# Patient Record
Sex: Female | Born: 1935 | Race: Black or African American | Hispanic: No | Smoking: Never smoker
Health system: Southern US, Community
[De-identification: ages and names within clinical notes are randomized; demographics above are authoritative.]

## PROBLEM LIST (undated history)

## (undated) DIAGNOSIS — H409 Unspecified glaucoma: Secondary | ICD-10-CM

## (undated) DIAGNOSIS — E119 Type 2 diabetes mellitus without complications: Secondary | ICD-10-CM

## (undated) DIAGNOSIS — E785 Hyperlipidemia, unspecified: Secondary | ICD-10-CM

## (undated) DIAGNOSIS — I10 Essential (primary) hypertension: Secondary | ICD-10-CM

## (undated) DIAGNOSIS — M1990 Unspecified osteoarthritis, unspecified site: Secondary | ICD-10-CM

## (undated) DIAGNOSIS — R0602 Shortness of breath: Secondary | ICD-10-CM

## (undated) HISTORY — PX: JOINT REPLACEMENT: SHX530

## (undated) HISTORY — DX: Unspecified osteoarthritis, unspecified site: M19.90

## (undated) HISTORY — DX: Essential (primary) hypertension: I10

## (undated) HISTORY — DX: Unspecified glaucoma: H40.9

## (undated) HISTORY — PX: EYE SURGERY: SHX253

## (undated) HISTORY — DX: Type 2 diabetes mellitus without complications: E11.9

## (undated) HISTORY — PX: CHOLECYSTECTOMY: SHX55

## (undated) HISTORY — DX: Hyperlipidemia, unspecified: E78.5

## (undated) HISTORY — PX: HIP SURGERY: SHX245

## (undated) HISTORY — PX: TONSILLECTOMY: SUR1361

---

## 2004-09-16 ENCOUNTER — Ambulatory Visit: Payer: Self-pay | Admitting: Family Medicine

## 2004-09-30 ENCOUNTER — Other Ambulatory Visit: Payer: Self-pay

## 2004-09-30 ENCOUNTER — Observation Stay: Payer: Self-pay | Admitting: Orthopedic Surgery

## 2005-09-13 LAB — HM DEXA SCAN

## 2005-09-29 ENCOUNTER — Ambulatory Visit: Payer: Self-pay | Admitting: Family Medicine

## 2006-10-03 ENCOUNTER — Ambulatory Visit: Payer: Self-pay | Admitting: Family Medicine

## 2007-12-12 ENCOUNTER — Ambulatory Visit: Payer: Self-pay | Admitting: Family Medicine

## 2007-12-12 LAB — HM MAMMOGRAPHY

## 2008-02-14 ENCOUNTER — Ambulatory Visit: Payer: Self-pay | Admitting: Gastroenterology

## 2008-02-14 LAB — HM COLONOSCOPY

## 2008-11-16 ENCOUNTER — Emergency Department: Payer: Self-pay | Admitting: Internal Medicine

## 2011-08-18 DIAGNOSIS — H903 Sensorineural hearing loss, bilateral: Secondary | ICD-10-CM | POA: Diagnosis not present

## 2011-08-18 DIAGNOSIS — H6120 Impacted cerumen, unspecified ear: Secondary | ICD-10-CM | POA: Diagnosis not present

## 2011-12-08 DIAGNOSIS — E119 Type 2 diabetes mellitus without complications: Secondary | ICD-10-CM | POA: Diagnosis not present

## 2011-12-08 DIAGNOSIS — Z79899 Other long term (current) drug therapy: Secondary | ICD-10-CM | POA: Diagnosis not present

## 2011-12-08 DIAGNOSIS — N289 Disorder of kidney and ureter, unspecified: Secondary | ICD-10-CM | POA: Diagnosis not present

## 2011-12-08 DIAGNOSIS — D649 Anemia, unspecified: Secondary | ICD-10-CM | POA: Diagnosis not present

## 2011-12-08 DIAGNOSIS — I10 Essential (primary) hypertension: Secondary | ICD-10-CM | POA: Diagnosis not present

## 2011-12-21 DIAGNOSIS — H2510 Age-related nuclear cataract, unspecified eye: Secondary | ICD-10-CM | POA: Diagnosis not present

## 2012-01-06 DIAGNOSIS — H2510 Age-related nuclear cataract, unspecified eye: Secondary | ICD-10-CM | POA: Diagnosis not present

## 2012-01-18 DIAGNOSIS — H2510 Age-related nuclear cataract, unspecified eye: Secondary | ICD-10-CM | POA: Diagnosis not present

## 2012-02-20 DIAGNOSIS — H269 Unspecified cataract: Secondary | ICD-10-CM | POA: Diagnosis not present

## 2012-02-20 DIAGNOSIS — H2510 Age-related nuclear cataract, unspecified eye: Secondary | ICD-10-CM | POA: Diagnosis not present

## 2012-02-21 DIAGNOSIS — H2510 Age-related nuclear cataract, unspecified eye: Secondary | ICD-10-CM | POA: Diagnosis not present

## 2012-03-19 DIAGNOSIS — H269 Unspecified cataract: Secondary | ICD-10-CM | POA: Diagnosis not present

## 2012-03-19 DIAGNOSIS — H2510 Age-related nuclear cataract, unspecified eye: Secondary | ICD-10-CM | POA: Diagnosis not present

## 2012-06-18 DIAGNOSIS — H409 Unspecified glaucoma: Secondary | ICD-10-CM | POA: Diagnosis not present

## 2012-06-18 DIAGNOSIS — H4011X0 Primary open-angle glaucoma, stage unspecified: Secondary | ICD-10-CM | POA: Diagnosis not present

## 2012-07-09 DIAGNOSIS — M76899 Other specified enthesopathies of unspecified lower limb, excluding foot: Secondary | ICD-10-CM | POA: Diagnosis not present

## 2012-07-09 DIAGNOSIS — Z23 Encounter for immunization: Secondary | ICD-10-CM | POA: Diagnosis not present

## 2012-07-09 DIAGNOSIS — E7800 Pure hypercholesterolemia, unspecified: Secondary | ICD-10-CM | POA: Diagnosis not present

## 2012-07-09 DIAGNOSIS — I10 Essential (primary) hypertension: Secondary | ICD-10-CM | POA: Diagnosis not present

## 2012-07-09 DIAGNOSIS — E119 Type 2 diabetes mellitus without complications: Secondary | ICD-10-CM | POA: Diagnosis not present

## 2012-07-09 DIAGNOSIS — M1990 Unspecified osteoarthritis, unspecified site: Secondary | ICD-10-CM | POA: Diagnosis not present

## 2012-07-10 DIAGNOSIS — E119 Type 2 diabetes mellitus without complications: Secondary | ICD-10-CM | POA: Diagnosis not present

## 2012-07-10 DIAGNOSIS — D638 Anemia in other chronic diseases classified elsewhere: Secondary | ICD-10-CM | POA: Diagnosis not present

## 2012-07-10 DIAGNOSIS — I10 Essential (primary) hypertension: Secondary | ICD-10-CM | POA: Diagnosis not present

## 2012-07-10 DIAGNOSIS — R5381 Other malaise: Secondary | ICD-10-CM | POA: Diagnosis not present

## 2012-07-10 DIAGNOSIS — R5383 Other fatigue: Secondary | ICD-10-CM | POA: Diagnosis not present

## 2012-07-10 DIAGNOSIS — R079 Chest pain, unspecified: Secondary | ICD-10-CM | POA: Diagnosis not present

## 2012-07-10 DIAGNOSIS — E785 Hyperlipidemia, unspecified: Secondary | ICD-10-CM | POA: Diagnosis not present

## 2012-07-10 DIAGNOSIS — E7800 Pure hypercholesterolemia, unspecified: Secondary | ICD-10-CM | POA: Diagnosis not present

## 2012-07-11 DIAGNOSIS — E119 Type 2 diabetes mellitus without complications: Secondary | ICD-10-CM | POA: Diagnosis not present

## 2012-07-11 DIAGNOSIS — I10 Essential (primary) hypertension: Secondary | ICD-10-CM | POA: Diagnosis not present

## 2012-07-11 DIAGNOSIS — R0789 Other chest pain: Secondary | ICD-10-CM | POA: Diagnosis not present

## 2012-07-11 DIAGNOSIS — R109 Unspecified abdominal pain: Secondary | ICD-10-CM | POA: Diagnosis not present

## 2012-07-11 DIAGNOSIS — E785 Hyperlipidemia, unspecified: Secondary | ICD-10-CM | POA: Diagnosis not present

## 2012-07-11 DIAGNOSIS — I119 Hypertensive heart disease without heart failure: Secondary | ICD-10-CM | POA: Diagnosis not present

## 2012-07-11 DIAGNOSIS — R7989 Other specified abnormal findings of blood chemistry: Secondary | ICD-10-CM | POA: Diagnosis not present

## 2012-07-11 DIAGNOSIS — I059 Rheumatic mitral valve disease, unspecified: Secondary | ICD-10-CM | POA: Diagnosis not present

## 2012-07-11 DIAGNOSIS — R748 Abnormal levels of other serum enzymes: Secondary | ICD-10-CM | POA: Diagnosis not present

## 2012-07-13 ENCOUNTER — Ambulatory Visit: Payer: Self-pay | Admitting: Family Medicine

## 2012-07-13 DIAGNOSIS — R141 Gas pain: Secondary | ICD-10-CM | POA: Diagnosis not present

## 2012-07-13 DIAGNOSIS — K8020 Calculus of gallbladder without cholecystitis without obstruction: Secondary | ICD-10-CM | POA: Diagnosis not present

## 2012-07-13 DIAGNOSIS — R7989 Other specified abnormal findings of blood chemistry: Secondary | ICD-10-CM | POA: Diagnosis not present

## 2012-07-16 DIAGNOSIS — R109 Unspecified abdominal pain: Secondary | ICD-10-CM | POA: Diagnosis not present

## 2012-07-16 DIAGNOSIS — K831 Obstruction of bile duct: Secondary | ICD-10-CM | POA: Diagnosis not present

## 2012-07-20 DIAGNOSIS — K8070 Calculus of gallbladder and bile duct without cholecystitis without obstruction: Secondary | ICD-10-CM | POA: Diagnosis not present

## 2012-07-20 DIAGNOSIS — K838 Other specified diseases of biliary tract: Secondary | ICD-10-CM | POA: Diagnosis not present

## 2012-07-20 DIAGNOSIS — R17 Unspecified jaundice: Secondary | ICD-10-CM | POA: Diagnosis not present

## 2012-07-20 DIAGNOSIS — K8020 Calculus of gallbladder without cholecystitis without obstruction: Secondary | ICD-10-CM | POA: Diagnosis not present

## 2012-07-20 DIAGNOSIS — R935 Abnormal findings on diagnostic imaging of other abdominal regions, including retroperitoneum: Secondary | ICD-10-CM | POA: Diagnosis not present

## 2012-07-23 DIAGNOSIS — K8020 Calculus of gallbladder without cholecystitis without obstruction: Secondary | ICD-10-CM | POA: Diagnosis not present

## 2012-07-23 DIAGNOSIS — K8070 Calculus of gallbladder and bile duct without cholecystitis without obstruction: Secondary | ICD-10-CM | POA: Diagnosis not present

## 2012-08-06 DIAGNOSIS — R110 Nausea: Secondary | ICD-10-CM | POA: Diagnosis not present

## 2012-08-06 DIAGNOSIS — M129 Arthropathy, unspecified: Secondary | ICD-10-CM | POA: Diagnosis not present

## 2012-08-06 DIAGNOSIS — D649 Anemia, unspecified: Secondary | ICD-10-CM | POA: Diagnosis not present

## 2012-08-06 DIAGNOSIS — N189 Chronic kidney disease, unspecified: Secondary | ICD-10-CM | POA: Diagnosis not present

## 2012-08-06 DIAGNOSIS — R7989 Other specified abnormal findings of blood chemistry: Secondary | ICD-10-CM | POA: Diagnosis not present

## 2012-08-06 DIAGNOSIS — K838 Other specified diseases of biliary tract: Secondary | ICD-10-CM | POA: Diagnosis not present

## 2012-08-06 DIAGNOSIS — Z79899 Other long term (current) drug therapy: Secondary | ICD-10-CM | POA: Diagnosis not present

## 2012-08-06 DIAGNOSIS — Z96649 Presence of unspecified artificial hip joint: Secondary | ICD-10-CM | POA: Diagnosis not present

## 2012-08-06 DIAGNOSIS — E119 Type 2 diabetes mellitus without complications: Secondary | ICD-10-CM | POA: Diagnosis not present

## 2012-08-06 DIAGNOSIS — K8010 Calculus of gallbladder with chronic cholecystitis without obstruction: Secondary | ICD-10-CM | POA: Diagnosis not present

## 2012-08-06 DIAGNOSIS — K8000 Calculus of gallbladder with acute cholecystitis without obstruction: Secondary | ICD-10-CM | POA: Diagnosis not present

## 2012-08-06 DIAGNOSIS — I129 Hypertensive chronic kidney disease with stage 1 through stage 4 chronic kidney disease, or unspecified chronic kidney disease: Secondary | ICD-10-CM | POA: Diagnosis not present

## 2012-08-09 DIAGNOSIS — I129 Hypertensive chronic kidney disease with stage 1 through stage 4 chronic kidney disease, or unspecified chronic kidney disease: Secondary | ICD-10-CM | POA: Diagnosis not present

## 2012-08-09 DIAGNOSIS — R110 Nausea: Secondary | ICD-10-CM | POA: Diagnosis not present

## 2012-08-09 DIAGNOSIS — K8050 Calculus of bile duct without cholangitis or cholecystitis without obstruction: Secondary | ICD-10-CM | POA: Diagnosis not present

## 2012-08-09 DIAGNOSIS — K8010 Calculus of gallbladder with chronic cholecystitis without obstruction: Secondary | ICD-10-CM | POA: Diagnosis not present

## 2012-08-09 DIAGNOSIS — K838 Other specified diseases of biliary tract: Secondary | ICD-10-CM | POA: Diagnosis not present

## 2012-08-09 DIAGNOSIS — R7989 Other specified abnormal findings of blood chemistry: Secondary | ICD-10-CM | POA: Diagnosis not present

## 2012-08-09 DIAGNOSIS — I10 Essential (primary) hypertension: Secondary | ICD-10-CM | POA: Diagnosis not present

## 2012-08-09 DIAGNOSIS — N189 Chronic kidney disease, unspecified: Secondary | ICD-10-CM | POA: Diagnosis not present

## 2012-08-09 DIAGNOSIS — K8020 Calculus of gallbladder without cholecystitis without obstruction: Secondary | ICD-10-CM | POA: Diagnosis not present

## 2012-08-09 DIAGNOSIS — E119 Type 2 diabetes mellitus without complications: Secondary | ICD-10-CM | POA: Diagnosis not present

## 2012-08-09 DIAGNOSIS — K8000 Calculus of gallbladder with acute cholecystitis without obstruction: Secondary | ICD-10-CM | POA: Diagnosis not present

## 2012-08-10 DIAGNOSIS — R7989 Other specified abnormal findings of blood chemistry: Secondary | ICD-10-CM | POA: Diagnosis not present

## 2012-08-10 DIAGNOSIS — R110 Nausea: Secondary | ICD-10-CM | POA: Diagnosis not present

## 2012-08-10 DIAGNOSIS — K838 Other specified diseases of biliary tract: Secondary | ICD-10-CM | POA: Diagnosis not present

## 2012-08-10 DIAGNOSIS — K8000 Calculus of gallbladder with acute cholecystitis without obstruction: Secondary | ICD-10-CM | POA: Diagnosis not present

## 2012-08-10 DIAGNOSIS — K8010 Calculus of gallbladder with chronic cholecystitis without obstruction: Secondary | ICD-10-CM | POA: Diagnosis not present

## 2012-08-10 DIAGNOSIS — E119 Type 2 diabetes mellitus without complications: Secondary | ICD-10-CM | POA: Diagnosis not present

## 2012-08-27 DIAGNOSIS — K8020 Calculus of gallbladder without cholecystitis without obstruction: Secondary | ICD-10-CM | POA: Diagnosis not present

## 2012-10-25 DIAGNOSIS — H409 Unspecified glaucoma: Secondary | ICD-10-CM | POA: Diagnosis not present

## 2012-10-25 DIAGNOSIS — H4011X0 Primary open-angle glaucoma, stage unspecified: Secondary | ICD-10-CM | POA: Diagnosis not present

## 2013-03-05 DIAGNOSIS — R5381 Other malaise: Secondary | ICD-10-CM | POA: Diagnosis not present

## 2013-03-05 DIAGNOSIS — E119 Type 2 diabetes mellitus without complications: Secondary | ICD-10-CM | POA: Diagnosis not present

## 2013-03-05 DIAGNOSIS — Z23 Encounter for immunization: Secondary | ICD-10-CM | POA: Diagnosis not present

## 2013-03-05 DIAGNOSIS — R9431 Abnormal electrocardiogram [ECG] [EKG]: Secondary | ICD-10-CM | POA: Diagnosis not present

## 2013-03-05 DIAGNOSIS — R109 Unspecified abdominal pain: Secondary | ICD-10-CM | POA: Diagnosis not present

## 2013-03-12 DIAGNOSIS — I209 Angina pectoris, unspecified: Secondary | ICD-10-CM | POA: Diagnosis not present

## 2013-03-12 DIAGNOSIS — R0602 Shortness of breath: Secondary | ICD-10-CM | POA: Diagnosis not present

## 2013-03-12 DIAGNOSIS — R9431 Abnormal electrocardiogram [ECG] [EKG]: Secondary | ICD-10-CM | POA: Diagnosis not present

## 2013-03-12 DIAGNOSIS — E782 Mixed hyperlipidemia: Secondary | ICD-10-CM | POA: Diagnosis not present

## 2013-03-14 DIAGNOSIS — H4011X0 Primary open-angle glaucoma, stage unspecified: Secondary | ICD-10-CM | POA: Diagnosis not present

## 2013-03-14 DIAGNOSIS — H409 Unspecified glaucoma: Secondary | ICD-10-CM | POA: Diagnosis not present

## 2013-04-10 DIAGNOSIS — D649 Anemia, unspecified: Secondary | ICD-10-CM | POA: Diagnosis not present

## 2013-04-10 DIAGNOSIS — I2510 Atherosclerotic heart disease of native coronary artery without angina pectoris: Secondary | ICD-10-CM | POA: Diagnosis not present

## 2013-04-10 DIAGNOSIS — R5381 Other malaise: Secondary | ICD-10-CM | POA: Diagnosis not present

## 2013-04-10 DIAGNOSIS — E119 Type 2 diabetes mellitus without complications: Secondary | ICD-10-CM | POA: Diagnosis not present

## 2013-04-10 DIAGNOSIS — Z23 Encounter for immunization: Secondary | ICD-10-CM | POA: Diagnosis not present

## 2013-04-16 DIAGNOSIS — E119 Type 2 diabetes mellitus without complications: Secondary | ICD-10-CM | POA: Diagnosis not present

## 2013-04-16 DIAGNOSIS — E782 Mixed hyperlipidemia: Secondary | ICD-10-CM | POA: Diagnosis not present

## 2013-04-16 DIAGNOSIS — R9430 Abnormal result of cardiovascular function study, unspecified: Secondary | ICD-10-CM | POA: Diagnosis not present

## 2013-04-16 DIAGNOSIS — I209 Angina pectoris, unspecified: Secondary | ICD-10-CM | POA: Diagnosis not present

## 2013-04-22 ENCOUNTER — Ambulatory Visit: Payer: Self-pay | Admitting: Internal Medicine

## 2013-04-22 DIAGNOSIS — I209 Angina pectoris, unspecified: Secondary | ICD-10-CM | POA: Diagnosis not present

## 2013-04-22 DIAGNOSIS — I10 Essential (primary) hypertension: Secondary | ICD-10-CM | POA: Diagnosis not present

## 2013-04-22 DIAGNOSIS — E119 Type 2 diabetes mellitus without complications: Secondary | ICD-10-CM | POA: Diagnosis not present

## 2013-04-22 DIAGNOSIS — R9430 Abnormal result of cardiovascular function study, unspecified: Secondary | ICD-10-CM | POA: Diagnosis not present

## 2013-04-22 DIAGNOSIS — I2510 Atherosclerotic heart disease of native coronary artery without angina pectoris: Secondary | ICD-10-CM | POA: Diagnosis not present

## 2013-04-22 DIAGNOSIS — E785 Hyperlipidemia, unspecified: Secondary | ICD-10-CM | POA: Diagnosis not present

## 2013-04-22 HISTORY — PX: CARDIAC CATHETERIZATION: SHX172

## 2013-04-24 ENCOUNTER — Encounter: Payer: Self-pay | Admitting: Cardiothoracic Surgery

## 2013-04-25 ENCOUNTER — Other Ambulatory Visit: Payer: Self-pay | Admitting: *Deleted

## 2013-04-25 DIAGNOSIS — E1129 Type 2 diabetes mellitus with other diabetic kidney complication: Secondary | ICD-10-CM | POA: Insufficient documentation

## 2013-04-25 DIAGNOSIS — I10 Essential (primary) hypertension: Secondary | ICD-10-CM | POA: Insufficient documentation

## 2013-04-25 DIAGNOSIS — E785 Hyperlipidemia, unspecified: Secondary | ICD-10-CM | POA: Insufficient documentation

## 2013-04-25 DIAGNOSIS — E119 Type 2 diabetes mellitus without complications: Secondary | ICD-10-CM | POA: Insufficient documentation

## 2013-04-26 ENCOUNTER — Encounter: Payer: Self-pay | Admitting: Cardiothoracic Surgery

## 2013-04-26 ENCOUNTER — Institutional Professional Consult (permissible substitution) (INDEPENDENT_AMBULATORY_CARE_PROVIDER_SITE_OTHER): Payer: Medicare Other | Admitting: Cardiothoracic Surgery

## 2013-04-26 ENCOUNTER — Other Ambulatory Visit: Payer: Self-pay | Admitting: *Deleted

## 2013-04-26 VITALS — BP 94/49 | HR 79 | Resp 20 | Ht 64.0 in | Wt 149.0 lb

## 2013-04-26 DIAGNOSIS — I340 Nonrheumatic mitral (valve) insufficiency: Secondary | ICD-10-CM

## 2013-04-26 DIAGNOSIS — M1610 Unilateral primary osteoarthritis, unspecified hip: Secondary | ICD-10-CM | POA: Diagnosis not present

## 2013-04-26 DIAGNOSIS — I059 Rheumatic mitral valve disease, unspecified: Secondary | ICD-10-CM | POA: Diagnosis not present

## 2013-04-26 DIAGNOSIS — I2510 Atherosclerotic heart disease of native coronary artery without angina pectoris: Secondary | ICD-10-CM

## 2013-04-26 NOTE — Progress Notes (Signed)
PCP is Gabriela Post, MD Referring Provider is Corey Skains, MD  Chief Complaint  Patient presents with  . Coronary Artery Disease    Surgical eval on CAD, Lt main, Cardiac cath 04/22/13, ECHO 03/28/13    HPI: 79 year old Afro-American female diabetic nonsmoker living independently with recent onset dyspnea on exertion and decreasing exercise tolerance. No discrete chest pain, no orthopnea or PND. Cardiology consultation>>> myocardial perfusion study which was abnormal and 2-D echocardiogram which was abnormal for EF 30%, moderate to severe mitral regurgitation. Patient underwent cardiac catheterization earlier this week demonstrating left main and severe three-vessel CADin a diabetic pattern. A cardiothoracic surgical evaluation was requested. Patient has been stable since cardiac catheterization via right femoral artery. Dressing of site removed today without hematoma.  No prior history of MI. No strong family history of CAD or MI. Last hospitalization was for laparoscopic cholecystectomy. Patient has had remote right total hip replacement. Never had problems with anesthesia or bleeding.   Past Medical History  Diagnosis Date  . Hypertension   . Diabetes mellitus   . Hyperlipidemia   . Arthritis   . Glaucoma     Past Surgical History  Procedure Laterality Date  . Hip surgery      right    Family History  Problem Relation Age of Onset  . Diabetes Sister     Social History History  Substance Use Topics  . Smoking status: Never Smoker   . Smokeless tobacco: Never Used  . Alcohol Use: No    Current Outpatient Prescriptions  Medication Sig Dispense Refill  . aspirin 81 MG tablet Take 81 mg by mouth daily.      . carvedilol (COREG) 3.125 MG tablet Take 3.125 mg by mouth 2 (two) times daily with a meal.      . hydrochlorothiazide (HYDRODIURIL) 25 MG tablet Take 25 mg by mouth daily.      Marland Kitchen lisinopril (PRINIVIL,ZESTRIL) 5 MG tablet Take 5 mg by mouth daily.      .  pioglitazone (ACTOS) 45 MG tablet Take 45 mg by mouth daily.       No current facility-administered medications for this visit.    No Known Allergies  Review of Systems General no weight loss fever or recent respiratory illness HEENT-dental plates seen a dentist within the past 12 months Thoracic nonsmoker no hemoptysis Cardiac no history of murmur or arrhythmia or MI Neurologic no history of stroke or dementia or seizure Endocrine positive diabetes BUN 30 creatinine 1.4 Vascular negative for DVT claudication TIA Musculoskeletal-right hip replacement walks with a limp Neurologic right-hand dominant  BP 94/49  Pulse 79  Resp 20  Ht 5\' 4"  (1.626 m)  Wt 149 lb (67.586 kg)  BMI 25.56 kg/m2  SpO2 97%   Physical Exam General appearance elderly after married female coming by her daughter no acute distress HEENT normocephalic pupils equal dentition adequate Neck without JVD mass or carotid bruit Lymphatics without palpable nodes in the neck Thorax no deformity or tenderness, breath sounds clear Cardiac regular rhythm, faint 2/6 holosystolic murmur Abdomen soft nontender without pulsatile mass Extremities no clubbing cyanosis edema or tenderness Vascular nonpalpable pedal pulses bilaterally, no obvious venous insufficiency of the lower extremities Neurologic nonfocal walks with a limp  Diagnostic Tests: Coronary  Arteriogram  reviewed demonstrating moderate left main stenosis high-grade LAD stenosis atretic chronically occluded circumflex and nondominant RCA with 80% stenosis. Echo report demonstrates moderate to severe MR    Impression: CAD ischemic MR symptomatic. Patient is interested in  surgery to improve quality of life and improved survival. She would be at increased risk due to rib as age and reduced LV function. She understands that she may very well need skilled nursing facility placement after discharge from hospital until she can return home to live  independently.  She wishes to hold off on surgery until after her birthday on [birth year]. We will schedule her for surgery October 16 with preop assessment October 14.      Plan:CABG mitral valve repair October 16 at [facility] hospital

## 2013-05-01 ENCOUNTER — Other Ambulatory Visit: Payer: Self-pay | Admitting: *Deleted

## 2013-05-01 DIAGNOSIS — I2510 Atherosclerotic heart disease of native coronary artery without angina pectoris: Secondary | ICD-10-CM

## 2013-05-01 DIAGNOSIS — I059 Rheumatic mitral valve disease, unspecified: Secondary | ICD-10-CM

## 2013-05-24 ENCOUNTER — Encounter (HOSPITAL_COMMUNITY): Payer: Self-pay | Admitting: Pharmacist

## 2013-05-28 ENCOUNTER — Ambulatory Visit (HOSPITAL_COMMUNITY)
Admission: RE | Admit: 2013-05-28 | Discharge: 2013-05-28 | Disposition: A | Discharge status: Home / Self Care | Payer: Medicare Other | Source: Ambulatory Visit | Attending: Cardiothoracic Surgery | Admitting: Cardiothoracic Surgery

## 2013-05-28 ENCOUNTER — Encounter (HOSPITAL_COMMUNITY)
Admission: RE | Admit: 2013-05-28 | Discharge: 2013-05-28 | Disposition: A | Discharge status: Home / Self Care | Payer: Medicare Other | Source: Ambulatory Visit | Attending: Cardiothoracic Surgery | Admitting: Cardiothoracic Surgery

## 2013-05-28 ENCOUNTER — Encounter (HOSPITAL_COMMUNITY): Payer: Self-pay

## 2013-05-28 ENCOUNTER — Inpatient Hospital Stay (HOSPITAL_COMMUNITY)
Admission: RE | Admit: 2013-05-28 | Discharge: 2013-05-28 | Disposition: A | Discharge status: Home / Self Care | Payer: Medicare Other | Source: Ambulatory Visit | Attending: Cardiothoracic Surgery | Admitting: Cardiothoracic Surgery

## 2013-05-28 VITALS — BP 145/68 | HR 71 | Temp 98.3°F | Resp 18 | Ht 63.0 in | Wt 147.9 lb

## 2013-05-28 DIAGNOSIS — I059 Rheumatic mitral valve disease, unspecified: Secondary | ICD-10-CM

## 2013-05-28 DIAGNOSIS — Z01812 Encounter for preprocedural laboratory examination: Secondary | ICD-10-CM | POA: Insufficient documentation

## 2013-05-28 DIAGNOSIS — E8779 Other fluid overload: Secondary | ICD-10-CM | POA: Diagnosis not present

## 2013-05-28 DIAGNOSIS — Z01818 Encounter for other preprocedural examination: Secondary | ICD-10-CM | POA: Insufficient documentation

## 2013-05-28 DIAGNOSIS — I10 Essential (primary) hypertension: Secondary | ICD-10-CM | POA: Diagnosis not present

## 2013-05-28 DIAGNOSIS — Z452 Encounter for adjustment and management of vascular access device: Secondary | ICD-10-CM | POA: Diagnosis not present

## 2013-05-28 DIAGNOSIS — J9819 Other pulmonary collapse: Secondary | ICD-10-CM | POA: Diagnosis not present

## 2013-05-28 DIAGNOSIS — J811 Chronic pulmonary edema: Secondary | ICD-10-CM | POA: Diagnosis not present

## 2013-05-28 DIAGNOSIS — I2510 Atherosclerotic heart disease of native coronary artery without angina pectoris: Secondary | ICD-10-CM

## 2013-05-28 DIAGNOSIS — Z01811 Encounter for preprocedural respiratory examination: Secondary | ICD-10-CM | POA: Insufficient documentation

## 2013-05-28 DIAGNOSIS — M129 Arthropathy, unspecified: Secondary | ICD-10-CM | POA: Diagnosis present

## 2013-05-28 DIAGNOSIS — R918 Other nonspecific abnormal finding of lung field: Secondary | ICD-10-CM | POA: Diagnosis not present

## 2013-05-28 DIAGNOSIS — Z01810 Encounter for preprocedural cardiovascular examination: Secondary | ICD-10-CM | POA: Insufficient documentation

## 2013-05-28 DIAGNOSIS — I959 Hypotension, unspecified: Secondary | ICD-10-CM | POA: Diagnosis present

## 2013-05-28 DIAGNOSIS — J90 Pleural effusion, not elsewhere classified: Secondary | ICD-10-CM | POA: Diagnosis not present

## 2013-05-28 DIAGNOSIS — D62 Acute posthemorrhagic anemia: Secondary | ICD-10-CM | POA: Diagnosis not present

## 2013-05-28 DIAGNOSIS — E785 Hyperlipidemia, unspecified: Secondary | ICD-10-CM | POA: Insufficient documentation

## 2013-05-28 DIAGNOSIS — E119 Type 2 diabetes mellitus without complications: Secondary | ICD-10-CM | POA: Diagnosis not present

## 2013-05-28 DIAGNOSIS — I4891 Unspecified atrial fibrillation: Secondary | ICD-10-CM | POA: Diagnosis not present

## 2013-05-28 HISTORY — DX: Shortness of breath: R06.02

## 2013-05-28 LAB — BLOOD GAS, ARTERIAL
Acid-base deficit: 2.7 mmol/L — ABNORMAL HIGH (ref 0.0–2.0)
Bicarbonate: 21.1 mEq/L (ref 20.0–24.0)
Drawn by: 24485
O2 Saturation: 96 %
Patient temperature: 98.6
TCO2: 22.1 mmol/L (ref 0–100)
pCO2 arterial: 33.5 mmHg — ABNORMAL LOW (ref 35.0–45.0)
pH, Arterial: 7.416 (ref 7.350–7.450)
pO2, Arterial: 81.6 mmHg (ref 80.0–100.0)

## 2013-05-28 LAB — COMPREHENSIVE METABOLIC PANEL
ALT: 10 U/L (ref 0–35)
AST: 20 U/L (ref 0–37)
Albumin: 3.8 g/dL (ref 3.5–5.2)
Alkaline Phosphatase: 109 U/L (ref 39–117)
BUN: 31 mg/dL — ABNORMAL HIGH (ref 6–23)
CO2: 23 mEq/L (ref 19–32)
Calcium: 9.5 mg/dL (ref 8.4–10.5)
Chloride: 105 mEq/L (ref 96–112)
Creatinine, Ser: 1.21 mg/dL — ABNORMAL HIGH (ref 0.50–1.10)
GFR calc Af Amer: 46 mL/min — ABNORMAL LOW (ref >90)
GFR calc non Af Amer: 40 mL/min — ABNORMAL LOW (ref >90)
Glucose, Bld: 109 mg/dL — ABNORMAL HIGH (ref 70–99)
Potassium: 4.6 mEq/L (ref 3.5–5.1)
Sodium: 140 mEq/L (ref 135–145)
Total Bilirubin: 0.3 mg/dL (ref 0.3–1.2)
Total Protein: 8.6 g/dL — ABNORMAL HIGH (ref 6.0–8.3)

## 2013-05-28 LAB — CBC
HCT: 32.7 % — ABNORMAL LOW (ref 36.0–46.0)
Hemoglobin: 10.5 g/dL — ABNORMAL LOW (ref 12.0–15.0)
MCH: 27.9 pg (ref 26.0–34.0)
MCHC: 32.1 g/dL (ref 30.0–36.0)
MCV: 87 fL (ref 78.0–100.0)
Platelets: 215 10*3/uL (ref 150–400)
RBC: 3.76 MIL/uL — ABNORMAL LOW (ref 3.87–5.11)
RDW: 14.6 % (ref 11.5–15.5)
WBC: 7.3 10*3/uL (ref 4.0–10.5)

## 2013-05-28 LAB — URINALYSIS, ROUTINE W REFLEX MICROSCOPIC
Bilirubin Urine: NEGATIVE
Glucose, UA: NEGATIVE mg/dL
Hgb urine dipstick: NEGATIVE
Ketones, ur: NEGATIVE mg/dL
Nitrite: NEGATIVE
Protein, ur: NEGATIVE mg/dL
Specific Gravity, Urine: 1.012 (ref 1.005–1.030)
Urobilinogen, UA: 0.2 mg/dL (ref 0.0–1.0)
pH: 7 (ref 5.0–8.0)

## 2013-05-28 LAB — PULMONARY FUNCTION TEST

## 2013-05-28 LAB — ABO/RH: ABO/RH(D): O POS

## 2013-05-28 LAB — HEMOGLOBIN A1C
Hgb A1c MFr Bld: 7.5 % — ABNORMAL HIGH (ref <5.7)
Mean Plasma Glucose: 169 mg/dL — ABNORMAL HIGH (ref <117)

## 2013-05-28 LAB — SURGICAL PCR SCREEN
MRSA, PCR: NEGATIVE
Staphylococcus aureus: NEGATIVE

## 2013-05-28 LAB — PROTIME-INR
INR: 1.01 (ref 0.00–1.49)
Prothrombin Time: 13.1 seconds (ref 11.6–15.2)

## 2013-05-28 LAB — URINE MICROSCOPIC-ADD ON

## 2013-05-28 LAB — APTT: aPTT: 33 seconds (ref 24–37)

## 2013-05-28 NOTE — Pre-Procedure Instructions (Signed)
Gabriela [name]  05/28/2013   Your procedure is scheduled on:  Friday, October 17th.  Report to [facility], Main Entrance/Entrance "A" at 5:30AM.  Call this number if you have problems the morning of surgery: [phone]   Remember:   Do not eat food or drink liquids after midnight.   Take these medicines the morning of surgery with A SIP OF WATER: carvedilol (COREG).              Do not take medications for diabetes.              Do Not tale any NSAIDS- Aleve, Ibuprofen, BC Powder.   Do not wear jewelry, make-up or nail polish.  Do not wear lotions, powders, or perfumes. You may wear deodorant.   Do not bring valuables to the hospital.  [facility] is not responsible for any belongings or valuables.               Contacts, dentures or bridgework may not be worn into surgery.  Leave suitcase in the car. After surgery it may be brought to your room.  For patients admitted to the hospital, discharge time is determined by your treatment team.                 Special Instructions: Incentive Spirometry - Practice and bring it with you on the day of surgery. Shower using CHG 2 nights before surgery and the night before surgery.  If you shower the day of surgery use CHG.  Use special wash - you have one bottle of CHG for all showers.  You should use approximately 1/3 of the bottle for each shower.   Please read over the following fact sheets that you were given: Pain Booklet, Coughing and Deep Breathing, Blood Transfusion Information and Surgical Site Infection Prevention

## 2013-05-28 NOTE — Progress Notes (Signed)
Patient informed Nurse that she had a stress test with Dr. Serafina Royals in [place], [place]. Cardiac cath in [facility] on 04/22/13. Patient denied any acute cardiac issues. PCP is Dr. Miguel Aschoff also in [place], [place].

## 2013-05-30 MED ORDER — VANCOMYCIN HCL 1000 MG IV SOLR
1000.0000 mg | INTRAVENOUS | Status: DC
  Filled 2013-05-30: qty 1000

## 2013-05-30 MED ORDER — SODIUM CHLORIDE 0.9 % IV SOLN
INTRAVENOUS | Status: AC
  Administered 2013-05-31: 2.4 [IU]/h via INTRAVENOUS
  Filled 2013-05-30: qty 1

## 2013-05-30 MED ORDER — MAGNESIUM SULFATE 50 % IJ SOLN
40.0000 meq | INTRAMUSCULAR | Status: DC
  Filled 2013-05-30: qty 10

## 2013-05-30 MED ORDER — SODIUM CHLORIDE 0.9 % IV SOLN
INTRAVENOUS | Status: AC
  Administered 2013-05-31: 69 mL/h via INTRAVENOUS
  Filled 2013-05-30: qty 40

## 2013-05-30 MED ORDER — VANCOMYCIN HCL 10 G IV SOLR
1250.0000 mg | INTRAVENOUS | Status: AC
  Administered 2013-05-31: 1250 mg via INTRAVENOUS
  Filled 2013-05-30: qty 1250

## 2013-05-30 MED ORDER — DEXTROSE 5 % IV SOLN
1.5000 g | INTRAVENOUS | Status: AC
  Administered 2013-05-31: .75 g via INTRAVENOUS
  Administered 2013-05-31: 1.5 g via INTRAVENOUS
  Filled 2013-05-30 (×2): qty 1.5

## 2013-05-30 MED ORDER — NITROGLYCERIN IN D5W 200-5 MCG/ML-% IV SOLN
2.0000 ug/min | INTRAVENOUS | Status: AC
  Administered 2013-05-31: 16 ug/min via INTRAVENOUS
  Filled 2013-05-30: qty 250

## 2013-05-30 MED ORDER — PHENYLEPHRINE HCL 10 MG/ML IJ SOLN
30.0000 ug/min | INTRAVENOUS | Status: AC
  Administered 2013-05-31 (×2): 10 ug/min via INTRAVENOUS
  Filled 2013-05-30: qty 2

## 2013-05-30 MED ORDER — PLASMA-LYTE 148 IV SOLN
INTRAVENOUS | Status: AC
  Administered 2013-05-31: 08:00:00
  Filled 2013-05-30: qty 2.5

## 2013-05-30 MED ORDER — METOPROLOL TARTRATE 12.5 MG HALF TABLET: 12.5000 mg | ORAL_TABLET | Freq: Once | ORAL | Status: DC

## 2013-05-30 MED ORDER — EPINEPHRINE HCL 1 MG/ML IJ SOLN
0.5000 ug/min | INTRAVENOUS | Status: DC
  Filled 2013-05-30: qty 4

## 2013-05-30 MED ORDER — DEXTROSE 5 % IV SOLN
750.0000 mg | INTRAVENOUS | Status: DC
  Filled 2013-05-30: qty 750

## 2013-05-30 MED ORDER — DEXMEDETOMIDINE HCL IN NACL 400 MCG/100ML IV SOLN
0.1000 ug/kg/h | INTRAVENOUS | Status: AC
  Administered 2013-05-31: 0.2 ug/kg/h via INTRAVENOUS
  Filled 2013-05-30: qty 100

## 2013-05-30 MED ORDER — DOPAMINE-DEXTROSE 3.2-5 MG/ML-% IV SOLN
2.0000 ug/kg/min | INTRAVENOUS | Status: DC
  Administered 2013-05-31: 3 ug/kg/min via INTRAVENOUS
  Filled 2013-05-30: qty 250

## 2013-05-30 MED ORDER — POTASSIUM CHLORIDE 2 MEQ/ML IV SOLN
80.0000 meq | INTRAVENOUS | Status: DC
  Filled 2013-05-30: qty 40

## 2013-05-30 MED ORDER — SODIUM CHLORIDE 0.9 % IV SOLN
INTRAVENOUS | Status: DC
  Filled 2013-05-30: qty 30

## 2013-05-31 ENCOUNTER — Encounter (HOSPITAL_COMMUNITY)
Admission: RE | Disposition: A | Discharge status: Home Health | Payer: Medicare Other | Source: Ambulatory Visit | Attending: Cardiothoracic Surgery

## 2013-05-31 ENCOUNTER — Encounter (HOSPITAL_COMMUNITY): Payer: Self-pay | Admitting: *Deleted

## 2013-05-31 ENCOUNTER — Inpatient Hospital Stay (HOSPITAL_COMMUNITY): Payer: Medicare Other

## 2013-05-31 ENCOUNTER — Inpatient Hospital Stay (HOSPITAL_COMMUNITY): Payer: Medicare Other | Admitting: Anesthesiology

## 2013-05-31 ENCOUNTER — Encounter (HOSPITAL_COMMUNITY): Payer: Medicare Other | Admitting: Anesthesiology

## 2013-05-31 ENCOUNTER — Inpatient Hospital Stay (HOSPITAL_COMMUNITY)
Admission: RE | Admit: 2013-05-31 | Discharge: 2013-06-08 | DRG: 220 | Disposition: A | Discharge status: Home Health | Payer: Medicare Other | Source: Ambulatory Visit | Attending: Cardiothoracic Surgery | Admitting: Cardiothoracic Surgery

## 2013-05-31 DIAGNOSIS — J90 Pleural effusion, not elsewhere classified: Secondary | ICD-10-CM | POA: Diagnosis present

## 2013-05-31 DIAGNOSIS — J811 Chronic pulmonary edema: Secondary | ICD-10-CM | POA: Diagnosis not present

## 2013-05-31 DIAGNOSIS — M129 Arthropathy, unspecified: Secondary | ICD-10-CM | POA: Diagnosis present

## 2013-05-31 DIAGNOSIS — I2510 Atherosclerotic heart disease of native coronary artery without angina pectoris: Principal | ICD-10-CM

## 2013-05-31 DIAGNOSIS — D62 Acute posthemorrhagic anemia: Secondary | ICD-10-CM | POA: Diagnosis not present

## 2013-05-31 DIAGNOSIS — E119 Type 2 diabetes mellitus without complications: Secondary | ICD-10-CM | POA: Diagnosis not present

## 2013-05-31 DIAGNOSIS — E785 Hyperlipidemia, unspecified: Secondary | ICD-10-CM | POA: Diagnosis present

## 2013-05-31 DIAGNOSIS — I959 Hypotension, unspecified: Secondary | ICD-10-CM | POA: Diagnosis not present

## 2013-05-31 DIAGNOSIS — I059 Rheumatic mitral valve disease, unspecified: Secondary | ICD-10-CM

## 2013-05-31 DIAGNOSIS — E8779 Other fluid overload: Secondary | ICD-10-CM | POA: Diagnosis not present

## 2013-05-31 DIAGNOSIS — I10 Essential (primary) hypertension: Secondary | ICD-10-CM | POA: Diagnosis present

## 2013-05-31 DIAGNOSIS — Z452 Encounter for adjustment and management of vascular access device: Secondary | ICD-10-CM | POA: Diagnosis not present

## 2013-05-31 DIAGNOSIS — I4891 Unspecified atrial fibrillation: Secondary | ICD-10-CM | POA: Diagnosis not present

## 2013-05-31 DIAGNOSIS — R918 Other nonspecific abnormal finding of lung field: Secondary | ICD-10-CM | POA: Diagnosis not present

## 2013-05-31 DIAGNOSIS — J9819 Other pulmonary collapse: Secondary | ICD-10-CM | POA: Diagnosis not present

## 2013-05-31 HISTORY — PX: CORONARY ARTERY BYPASS GRAFT: SHX141

## 2013-05-31 HISTORY — PX: INTRAOPERATIVE TRANSESOPHAGEAL ECHOCARDIOGRAM: SHX5062

## 2013-05-31 HISTORY — PX: MITRAL VALVE REPAIR: SHX2039

## 2013-05-31 LAB — POCT I-STAT 3, ART BLOOD GAS (G3+)
Acid-base deficit: 7 mmol/L — ABNORMAL HIGH (ref 0.0–2.0)
Acid-base deficit: 5 mmol/L — ABNORMAL HIGH (ref 0.0–2.0)
Acid-base deficit: 6 mmol/L — ABNORMAL HIGH (ref 0.0–2.0)
Acid-base deficit: 2 mmol/L (ref 0.0–2.0)
Bicarbonate: 19 mEq/L — ABNORMAL LOW (ref 20.0–24.0)
Bicarbonate: 20.3 mEq/L (ref 20.0–24.0)
Bicarbonate: 19.4 mEq/L — ABNORMAL LOW (ref 20.0–24.0)
Bicarbonate: 22.5 mEq/L (ref 20.0–24.0)
O2 Saturation: 100 %
O2 Saturation: 100 %
O2 Saturation: 98 %
O2 Saturation: 100 %
Patient temperature: 34.2
Patient temperature: 36.2
TCO2: 20 mmol/L (ref 0–100)
TCO2: 21 mmol/L (ref 0–100)
TCO2: 20 mmol/L (ref 0–100)
TCO2: 24 mmol/L (ref 0–100)
pCO2 arterial: 41 mmHg (ref 35.0–45.0)
pCO2 arterial: 35.2 mmHg (ref 35.0–45.0)
pCO2 arterial: 30.6 mmHg — ABNORMAL LOW (ref 35.0–45.0)
pCO2 arterial: 33.8 mmHg — ABNORMAL LOW (ref 35.0–45.0)
pH, Arterial: 7.274 — ABNORMAL LOW (ref 7.350–7.450)
pH, Arterial: 7.368 (ref 7.350–7.450)
pH, Arterial: 7.397 (ref 7.350–7.450)
pH, Arterial: 7.429 (ref 7.350–7.450)
pO2, Arterial: 405 mmHg — ABNORMAL HIGH (ref 80.0–100.0)
pO2, Arterial: 309 mmHg — ABNORMAL HIGH (ref 80.0–100.0)
pO2, Arterial: 92 mmHg (ref 80.0–100.0)
pO2, Arterial: 167 mmHg — ABNORMAL HIGH (ref 80.0–100.0)

## 2013-05-31 LAB — POCT I-STAT 4, (NA,K, GLUC, HGB,HCT)
Glucose, Bld: 183 mg/dL — ABNORMAL HIGH (ref 70–99)
Glucose, Bld: 184 mg/dL — ABNORMAL HIGH (ref 70–99)
Glucose, Bld: 143 mg/dL — ABNORMAL HIGH (ref 70–99)
Glucose, Bld: 113 mg/dL — ABNORMAL HIGH (ref 70–99)
Glucose, Bld: 125 mg/dL — ABNORMAL HIGH (ref 70–99)
Glucose, Bld: 84 mg/dL (ref 70–99)
HCT: 25 % — ABNORMAL LOW (ref 36.0–46.0)
HCT: 26 % — ABNORMAL LOW (ref 36.0–46.0)
HCT: 25 % — ABNORMAL LOW (ref 36.0–46.0)
HCT: 26 % — ABNORMAL LOW (ref 36.0–46.0)
HCT: 23 % — ABNORMAL LOW (ref 36.0–46.0)
HCT: 31 % — ABNORMAL LOW (ref 36.0–46.0)
Hemoglobin: 8.5 g/dL — ABNORMAL LOW (ref 12.0–15.0)
Hemoglobin: 8.8 g/dL — ABNORMAL LOW (ref 12.0–15.0)
Hemoglobin: 8.5 g/dL — ABNORMAL LOW (ref 12.0–15.0)
Hemoglobin: 8.8 g/dL — ABNORMAL LOW (ref 12.0–15.0)
Hemoglobin: 7.8 g/dL — ABNORMAL LOW (ref 12.0–15.0)
Hemoglobin: 10.5 g/dL — ABNORMAL LOW (ref 12.0–15.0)
Potassium: 4.5 mEq/L (ref 3.5–5.1)
Potassium: 5.1 mEq/L (ref 3.5–5.1)
Potassium: 4.5 mEq/L (ref 3.5–5.1)
Potassium: 4.4 mEq/L (ref 3.5–5.1)
Potassium: 4.2 mEq/L (ref 3.5–5.1)
Potassium: 3.8 mEq/L (ref 3.5–5.1)
Sodium: 139 mEq/L (ref 135–145)
Sodium: 140 mEq/L (ref 135–145)
Sodium: 138 mEq/L (ref 135–145)
Sodium: 141 mEq/L (ref 135–145)
Sodium: 139 mEq/L (ref 135–145)
Sodium: 141 mEq/L (ref 135–145)

## 2013-05-31 LAB — PREPARE RBC (CROSSMATCH)

## 2013-05-31 LAB — GLUCOSE, CAPILLARY
Glucose-Capillary: 100 mg/dL — ABNORMAL HIGH (ref 70–99)
Glucose-Capillary: 109 mg/dL — ABNORMAL HIGH (ref 70–99)
Glucose-Capillary: 113 mg/dL — ABNORMAL HIGH (ref 70–99)
Glucose-Capillary: 121 mg/dL — ABNORMAL HIGH (ref 70–99)
Glucose-Capillary: 124 mg/dL — ABNORMAL HIGH (ref 70–99)
Glucose-Capillary: 152 mg/dL — ABNORMAL HIGH (ref 70–99)
Glucose-Capillary: 84 mg/dL (ref 70–99)
Glucose-Capillary: 96 mg/dL (ref 70–99)

## 2013-05-31 LAB — CBC
HCT: 26.4 % — ABNORMAL LOW (ref 36.0–46.0)
Hemoglobin: 8.7 g/dL — ABNORMAL LOW (ref 12.0–15.0)
Hemoglobin: 9 g/dL — ABNORMAL LOW (ref 12.0–15.0)
MCH: 28.2 pg (ref 26.0–34.0)
MCH: 28.9 pg (ref 26.0–34.0)
MCHC: 33 g/dL (ref 30.0–36.0)
MCHC: 34.4 g/dL (ref 30.0–36.0)
MCV: 85.7 fL (ref 78.0–100.0)
MCV: 84.2 fL (ref 78.0–100.0)
Platelets: 148 10*3/uL — ABNORMAL LOW (ref 150–400)
RBC: 3.08 MIL/uL — ABNORMAL LOW (ref 3.87–5.11)
RDW: 14.4 % (ref 11.5–15.5)
RDW: 14.3 % (ref 11.5–15.5)
WBC: 11.5 10*3/uL — ABNORMAL HIGH (ref 4.0–10.5)

## 2013-05-31 LAB — APTT: aPTT: 35 seconds (ref 24–37)

## 2013-05-31 LAB — POCT I-STAT, CHEM 8
BUN: 20 mg/dL (ref 6–23)
Calcium, Ion: 1.27 mmol/L (ref 1.13–1.30)
Chloride: 106 mEq/L (ref 96–112)
Creatinine, Ser: 1.1 mg/dL (ref 0.50–1.10)
Glucose, Bld: 167 mg/dL — ABNORMAL HIGH (ref 70–99)
HCT: 26 % — ABNORMAL LOW (ref 36.0–46.0)
Hemoglobin: 8.8 g/dL — ABNORMAL LOW (ref 12.0–15.0)
Potassium: 4.3 mEq/L (ref 3.5–5.1)
Sodium: 139 mEq/L (ref 135–145)
TCO2: 20 mmol/L (ref 0–100)

## 2013-05-31 LAB — PLATELET COUNT: Platelets: 88 10*3/uL — ABNORMAL LOW (ref 150–400)

## 2013-05-31 LAB — PROTIME-INR
INR: 1.5 — ABNORMAL HIGH (ref 0.00–1.49)
Prothrombin Time: 17.7 seconds — ABNORMAL HIGH (ref 11.6–15.2)

## 2013-05-31 LAB — MAGNESIUM: Magnesium: 2.5 mg/dL (ref 1.5–2.5)

## 2013-05-31 LAB — HEMOGLOBIN AND HEMATOCRIT, BLOOD
HCT: 23.3 % — ABNORMAL LOW (ref 36.0–46.0)
Hemoglobin: 7.8 g/dL — ABNORMAL LOW (ref 12.0–15.0)

## 2013-05-31 SURGERY — CORONARY ARTERY BYPASS GRAFTING (CABG)
Anesthesia: General | Site: Mouth | Wound class: Clean

## 2013-05-31 MED ORDER — SODIUM CHLORIDE 0.9 % IV SOLN: 250.0000 mL | INTRAVENOUS | Status: DC

## 2013-05-31 MED ORDER — ACETAMINOPHEN 160 MG/5ML PO SOLN
1000.0000 mg | Freq: Four times a day (QID) | ORAL | Status: AC
  Filled 2013-05-31: qty 40

## 2013-05-31 MED ORDER — PROTAMINE SULFATE 10 MG/ML IV SOLN
INTRAVENOUS | Status: DC | PRN
  Administered 2013-05-31: 50 mg via INTRAVENOUS
  Administered 2013-05-31: 180 mg via INTRAVENOUS

## 2013-05-31 MED ORDER — SODIUM CHLORIDE 0.9 % IJ SOLN
3.0000 mL | INTRAMUSCULAR | Status: DC | PRN
  Administered 2013-06-04: 3 mL via INTRAVENOUS

## 2013-05-31 MED ORDER — SODIUM CHLORIDE 0.45 % IV SOLN: INTRAVENOUS | Status: DC

## 2013-05-31 MED ORDER — LACTATED RINGERS IV SOLN: INTRAVENOUS | Status: DC

## 2013-05-31 MED ORDER — NITROGLYCERIN IN D5W 200-5 MCG/ML-% IV SOLN: 0.0000 ug/min | INTRAVENOUS | Status: DC

## 2013-05-31 MED ORDER — MILRINONE IN DEXTROSE 20 MG/100ML IV SOLN
0.3000 ug/kg/min | INTRAVENOUS | Status: DC
  Administered 2013-06-01: 0.3 ug/kg/min via INTRAVENOUS
  Filled 2013-05-31: qty 100

## 2013-05-31 MED ORDER — SODIUM CHLORIDE 0.9 % IV SOLN
20.0000 ug | INTRAVENOUS | Status: AC
  Administered 2013-05-31: 20 ug via INTRAVENOUS
  Filled 2013-05-31: qty 5

## 2013-05-31 MED ORDER — ALBUMIN HUMAN 5 % IV SOLN
12.5000 g | Freq: Once | INTRAVENOUS | Status: AC
  Administered 2013-05-31: 12.5 g via INTRAVENOUS

## 2013-05-31 MED ORDER — TRAMADOL HCL 50 MG PO TABS
50.0000 mg | ORAL_TABLET | Freq: Four times a day (QID) | ORAL | Status: DC | PRN
  Administered 2013-06-06: 50 mg via ORAL
  Filled 2013-05-31 (×2): qty 1

## 2013-05-31 MED ORDER — POTASSIUM CHLORIDE 10 MEQ/50ML IV SOLN
10.0000 meq | INTRAVENOUS | Status: DC
  Administered 2013-05-31 (×2): 10 meq via INTRAVENOUS

## 2013-05-31 MED ORDER — SODIUM BICARBONATE 8.4 % IV SOLN
50.0000 meq | Freq: Once | INTRAVENOUS | Status: AC
  Administered 2013-05-31: 50 meq via INTRAVENOUS
  Filled 2013-05-31: qty 50

## 2013-05-31 MED ORDER — FENTANYL CITRATE 0.05 MG/ML IJ SOLN
INTRAMUSCULAR | Status: DC | PRN
  Administered 2013-05-31: 250 ug via INTRAVENOUS
  Administered 2013-05-31: 200 ug via INTRAVENOUS
  Administered 2013-05-31: 100 ug via INTRAVENOUS
  Administered 2013-05-31: 50 ug via INTRAVENOUS
  Administered 2013-05-31: 100 ug via INTRAVENOUS
  Administered 2013-05-31: 150 ug via INTRAVENOUS

## 2013-05-31 MED ORDER — MAGNESIUM SULFATE 40 MG/ML IJ SOLN: 4.0000 g | Freq: Once | INTRAMUSCULAR | Status: DC

## 2013-05-31 MED ORDER — MIDAZOLAM HCL 5 MG/5ML IJ SOLN
INTRAMUSCULAR | Status: DC | PRN
  Administered 2013-05-31: 4 mg via INTRAVENOUS
  Administered 2013-05-31: 1 mg via INTRAVENOUS
  Administered 2013-05-31: 2 mg via INTRAVENOUS
  Administered 2013-05-31: 3 mg via INTRAVENOUS

## 2013-05-31 MED ORDER — SODIUM CHLORIDE 0.9 % IR SOLN
Status: DC | PRN
  Administered 2013-05-31: 6000 mL

## 2013-05-31 MED ORDER — METOPROLOL TARTRATE 12.5 MG HALF TABLET
12.5000 mg | ORAL_TABLET | Freq: Two times a day (BID) | ORAL | Status: DC
  Administered 2013-06-02 – 2013-06-08 (×10): 12.5 mg via ORAL
  Filled 2013-05-31 (×17): qty 1

## 2013-05-31 MED ORDER — NITROPRUSSIDE SODIUM 25 MG/ML IV SOLN
0.2500 ug/kg/min | INTRAVENOUS | Status: DC
  Filled 2013-05-31: qty 2

## 2013-05-31 MED ORDER — DORZOLAMIDE HCL 2 % OP SOLN
1.0000 [drp] | Freq: Three times a day (TID) | OPHTHALMIC | Status: DC
  Administered 2013-05-31 – 2013-06-08 (×22): 1 [drp] via OPHTHALMIC
  Filled 2013-05-31 (×2): qty 10

## 2013-05-31 MED ORDER — LACTATED RINGERS IV SOLN
INTRAVENOUS | Status: DC | PRN
  Administered 2013-05-31 (×2): via INTRAVENOUS

## 2013-05-31 MED ORDER — ARTIFICIAL TEARS OP OINT
TOPICAL_OINTMENT | OPHTHALMIC | Status: DC | PRN
  Administered 2013-05-31: 1 via OPHTHALMIC

## 2013-05-31 MED ORDER — ACETAMINOPHEN 160 MG/5ML PO SOLN
650.0000 mg | Freq: Once | ORAL | Status: DC
Start: 2013-05-31 — End: 2013-06-08

## 2013-05-31 MED ORDER — BISACODYL 5 MG PO TBEC
10.0000 mg | DELAYED_RELEASE_TABLET | Freq: Every day | ORAL | Status: DC
  Administered 2013-06-02 – 2013-06-06 (×5): 10 mg via ORAL
  Filled 2013-05-31 (×5): qty 2

## 2013-05-31 MED ORDER — OXYCODONE HCL 5 MG PO TABS: 5.0000 mg | ORAL_TABLET | ORAL | Status: DC | PRN

## 2013-05-31 MED ORDER — METOPROLOL TARTRATE 1 MG/ML IV SOLN: 2.5000 mg | INTRAVENOUS | Status: DC | PRN

## 2013-05-31 MED ORDER — MORPHINE SULFATE 2 MG/ML IJ SOLN: 1.0000 mg | INTRAMUSCULAR | Status: AC | PRN

## 2013-05-31 MED ORDER — BISACODYL 10 MG RE SUPP: 10.0000 mg | Freq: Every day | RECTAL | Status: DC

## 2013-05-31 MED ORDER — FENTANYL CITRATE 0.05 MG/ML IJ SOLN
INTRAMUSCULAR | Status: AC
  Filled 2013-05-31: qty 2

## 2013-05-31 MED ORDER — LIDOCAINE HCL (CARDIAC) 20 MG/ML IV SOLN: INTRAVENOUS | Status: DC | PRN

## 2013-05-31 MED ORDER — DEXMEDETOMIDINE HCL IN NACL 400 MCG/100ML IV SOLN
0.4000 ug/kg/h | INTRAVENOUS | Status: DC
  Filled 2013-05-31: qty 100

## 2013-05-31 MED ORDER — MORPHINE SULFATE 2 MG/ML IJ SOLN
2.0000 mg | INTRAMUSCULAR | Status: DC | PRN
Start: 2013-05-31 — End: 2013-06-04
  Administered 2013-06-01: 4 mg via INTRAVENOUS
  Administered 2013-06-01: 2 mg via INTRAVENOUS
  Filled 2013-05-31: qty 2
  Filled 2013-05-31: qty 1

## 2013-05-31 MED ORDER — DEXMEDETOMIDINE HCL IN NACL 200 MCG/50ML IV SOLN: 0.1000 ug/kg/h | INTRAVENOUS | Status: DC

## 2013-05-31 MED ORDER — METOPROLOL TARTRATE 25 MG/10 ML ORAL SUSPENSION
12.5000 mg | Freq: Two times a day (BID) | ORAL | Status: DC
  Filled 2013-05-31 (×17): qty 5

## 2013-05-31 MED ORDER — MIDAZOLAM HCL 2 MG/2ML IJ SOLN
INTRAMUSCULAR | Status: AC
  Filled 2013-05-31: qty 2

## 2013-05-31 MED ORDER — HEMOSTATIC AGENTS (NO CHARGE) OPTIME
TOPICAL | Status: DC | PRN
  Administered 2013-05-31: 1 via TOPICAL

## 2013-05-31 MED ORDER — VECURONIUM BROMIDE 10 MG IV SOLR
INTRAVENOUS | Status: DC | PRN
  Administered 2013-05-31: 4 mg via INTRAVENOUS
  Administered 2013-05-31: 6 mg via INTRAVENOUS

## 2013-05-31 MED ORDER — TRAVOPROST 0.004 % OP SOLN: 1.0000 [drp] | Freq: Every day | OPHTHALMIC | Status: DC

## 2013-05-31 MED ORDER — LATANOPROST 0.005 % OP SOLN
1.0000 [drp] | Freq: Every day | OPHTHALMIC | Status: DC
  Administered 2013-05-31 – 2013-06-07 (×7): 1 [drp] via OPHTHALMIC
  Filled 2013-05-31 (×2): qty 2.5

## 2013-05-31 MED ORDER — INSULIN REGULAR BOLUS VIA INFUSION
0.0000 [IU] | Freq: Three times a day (TID) | INTRAVENOUS | Status: DC
  Filled 2013-05-31: qty 10

## 2013-05-31 MED ORDER — ACETAMINOPHEN 650 MG RE SUPP: 650.0000 mg | Freq: Once | RECTAL | Status: DC

## 2013-05-31 MED ORDER — BRIMONIDINE TARTRATE 0.2 % OP SOLN
1.0000 [drp] | Freq: Three times a day (TID) | OPHTHALMIC | Status: DC
  Administered 2013-05-31 – 2013-06-08 (×22): 1 [drp] via OPHTHALMIC
  Filled 2013-05-31 (×2): qty 5

## 2013-05-31 MED ORDER — POTASSIUM CHLORIDE 10 MEQ/50ML IV SOLN: 10.0000 meq | INTRAVENOUS | Status: DC | PRN

## 2013-05-31 MED ORDER — LABETALOL HCL 5 MG/ML IV SOLN: 10.0000 mg | INTRAVENOUS | Status: DC | PRN

## 2013-05-31 MED ORDER — PHENYLEPHRINE HCL 10 MG/ML IJ SOLN
10.0000 mg | INTRAVENOUS | Status: DC | PRN
  Administered 2013-05-31: 3.33 ug/min via INTRAVENOUS

## 2013-05-31 MED ORDER — DOCUSATE SODIUM 100 MG PO CAPS
200.0000 mg | ORAL_CAPSULE | Freq: Every day | ORAL | Status: DC
  Administered 2013-06-02 – 2013-06-08 (×6): 200 mg via ORAL
  Filled 2013-05-31 (×6): qty 2

## 2013-05-31 MED ORDER — SODIUM CHLORIDE 0.9 % IV SOLN: INTRAVENOUS | Status: DC

## 2013-05-31 MED ORDER — METOCLOPRAMIDE HCL 5 MG/ML IJ SOLN
10.0000 mg | Freq: Four times a day (QID) | INTRAMUSCULAR | Status: AC
  Administered 2013-05-31 – 2013-06-01 (×3): 10 mg via INTRAVENOUS
  Filled 2013-05-31 (×3): qty 2

## 2013-05-31 MED ORDER — SODIUM CHLORIDE 0.9 % IJ SOLN
3.0000 mL | Freq: Two times a day (BID) | INTRAMUSCULAR | Status: DC
  Administered 2013-06-02 – 2013-06-04 (×4): 3 mL via INTRAVENOUS

## 2013-05-31 MED ORDER — ASPIRIN 81 MG PO CHEW
324.0000 mg | CHEWABLE_TABLET | Freq: Every day | ORAL | Status: DC
  Administered 2013-06-05: 324 mg
  Filled 2013-05-31: qty 4

## 2013-05-31 MED ORDER — POTASSIUM CHLORIDE 10 MEQ/50ML IV SOLN
10.0000 meq | INTRAVENOUS | Status: AC
  Administered 2013-05-31 (×2): 10 meq via INTRAVENOUS

## 2013-05-31 MED ORDER — HEPARIN SODIUM (PORCINE) 1000 UNIT/ML IJ SOLN
INTRAMUSCULAR | Status: DC | PRN
  Administered 2013-05-31: 15000 [IU] via INTRAVENOUS
  Administered 2013-05-31: 3000 [IU] via INTRAVENOUS

## 2013-05-31 MED ORDER — HYDROMORPHONE HCL PF 1 MG/ML IJ SOLN: 0.2500 mg | INTRAMUSCULAR | Status: DC | PRN

## 2013-05-31 MED ORDER — ROCURONIUM BROMIDE 100 MG/10ML IV SOLN
INTRAVENOUS | Status: DC | PRN
  Administered 2013-05-31 (×2): 50 mg via INTRAVENOUS

## 2013-05-31 MED ORDER — ONDANSETRON HCL 4 MG/2ML IJ SOLN: 4.0000 mg | Freq: Once | INTRAMUSCULAR | Status: DC | PRN

## 2013-05-31 MED ORDER — LACTATED RINGERS IV SOLN: 500.0000 mL | Freq: Once | INTRAVENOUS | Status: AC | PRN

## 2013-05-31 MED ORDER — LACTATED RINGERS IV SOLN
INTRAVENOUS | Status: DC | PRN
  Administered 2013-05-31 (×2): via INTRAVENOUS

## 2013-05-31 MED ORDER — ALBUMIN HUMAN 5 % IV SOLN
INTRAVENOUS | Status: DC | PRN
  Administered 2013-05-31: 14:00:00 via INTRAVENOUS

## 2013-05-31 MED ORDER — MIDAZOLAM HCL 2 MG/2ML IJ SOLN: 2.0000 mg | INTRAMUSCULAR | Status: DC | PRN

## 2013-05-31 MED ORDER — VANCOMYCIN HCL IN DEXTROSE 1-5 GM/200ML-% IV SOLN
1000.0000 mg | INTRAVENOUS | Status: AC
  Administered 2013-05-31 – 2013-06-01 (×2): 1000 mg via INTRAVENOUS
  Filled 2013-05-31 (×3): qty 200

## 2013-05-31 MED ORDER — PANTOPRAZOLE SODIUM 40 MG PO TBEC
40.0000 mg | DELAYED_RELEASE_TABLET | Freq: Every day | ORAL | Status: DC
  Administered 2013-06-02 – 2013-06-08 (×7): 40 mg via ORAL
  Filled 2013-05-31 (×7): qty 1

## 2013-05-31 MED ORDER — MAGNESIUM SULFATE 40 MG/ML IJ SOLN
INTRAMUSCULAR | Status: AC
  Administered 2013-05-31: 4 g
  Filled 2013-05-31: qty 100

## 2013-05-31 MED ORDER — GLYCOPYRROLATE 0.2 MG/ML IJ SOLN
INTRAMUSCULAR | Status: DC | PRN
  Administered 2013-05-31: .2 mg via INTRAVENOUS

## 2013-05-31 MED ORDER — FAMOTIDINE IN NACL 20-0.9 MG/50ML-% IV SOLN
20.0000 mg | Freq: Two times a day (BID) | INTRAVENOUS | Status: AC
  Administered 2013-05-31 – 2013-06-01 (×2): 20 mg via INTRAVENOUS
  Filled 2013-05-31 (×2): qty 50

## 2013-05-31 MED ORDER — ASPIRIN EC 325 MG PO TBEC
325.0000 mg | DELAYED_RELEASE_TABLET | Freq: Every day | ORAL | Status: DC
  Administered 2013-06-02 – 2013-06-08 (×6): 325 mg via ORAL
  Filled 2013-05-31 (×8): qty 1

## 2013-05-31 MED ORDER — PROPOFOL 10 MG/ML IV BOLUS
INTRAVENOUS | Status: DC | PRN
  Administered 2013-05-31: 100 mg via INTRAVENOUS

## 2013-05-31 MED ORDER — ACETAMINOPHEN 500 MG PO TABS
1000.0000 mg | ORAL_TABLET | Freq: Four times a day (QID) | ORAL | Status: AC
  Administered 2013-06-02 – 2013-06-05 (×11): 1000 mg via ORAL
  Filled 2013-05-31 (×20): qty 2

## 2013-05-31 MED ORDER — CALCIUM CHLORIDE 10 % IV SOLN
INTRAVENOUS | Status: DC | PRN
  Administered 2013-05-31: 100 mg via INTRAVENOUS

## 2013-05-31 MED ORDER — ALBUMIN HUMAN 5 % IV SOLN
250.0000 mL | INTRAVENOUS | Status: AC | PRN
  Administered 2013-05-31: 250 mL via INTRAVENOUS

## 2013-05-31 MED ORDER — SODIUM CHLORIDE 0.9 % IV SOLN
INTRAVENOUS | Status: DC
  Filled 2013-05-31: qty 1

## 2013-05-31 MED ORDER — PHENYLEPHRINE HCL 10 MG/ML IJ SOLN
0.0000 ug/min | INTRAMUSCULAR | Status: DC
  Filled 2013-05-31: qty 2

## 2013-05-31 MED ORDER — METOCLOPRAMIDE HCL 5 MG/ML IJ SOLN
10.0000 mg | Freq: Four times a day (QID) | INTRAMUSCULAR | Status: DC
  Filled 2013-05-31 (×3): qty 2

## 2013-05-31 MED ORDER — SODIUM CHLORIDE 0.9 % IV SOLN
0.5000 g/h | INTRAVENOUS | Status: DC
  Filled 2013-05-31: qty 20

## 2013-05-31 MED ORDER — DEXTROSE 5 % IV SOLN
1.5000 g | Freq: Two times a day (BID) | INTRAVENOUS | Status: AC
  Administered 2013-05-31 – 2013-06-02 (×4): 1.5 g via INTRAVENOUS
  Filled 2013-05-31 (×4): qty 1.5

## 2013-05-31 MED ORDER — MILRINONE IN DEXTROSE 20 MG/100ML IV SOLN
INTRAVENOUS | Status: DC | PRN
  Administered 2013-05-31: .3 ug/kg/min via INTRAVENOUS

## 2013-05-31 MED ORDER — DOPAMINE-DEXTROSE 3.2-5 MG/ML-% IV SOLN: 0.0000 ug/kg/min | INTRAVENOUS | Status: DC

## 2013-05-31 MED ORDER — MILRINONE IN DEXTROSE 20 MG/100ML IV SOLN
0.1250 ug/kg/min | INTRAVENOUS | Status: DC
  Filled 2013-05-31: qty 100

## 2013-05-31 MED ORDER — CHLORHEXIDINE GLUCONATE 4 % EX LIQD: 30.0000 mL | CUTANEOUS | Status: DC

## 2013-05-31 MED ORDER — ONDANSETRON HCL 4 MG/2ML IJ SOLN: 4.0000 mg | Freq: Four times a day (QID) | INTRAMUSCULAR | Status: DC | PRN

## 2013-05-31 MED ORDER — SODIUM CHLORIDE 0.9 % IJ SOLN
OROMUCOSAL | Status: DC | PRN
  Administered 2013-05-31 (×3): via TOPICAL

## 2013-05-31 SURGICAL SUPPLY — 145 items
ADAPTER CARDIO PERF ANTE/RETRO (ADAPTER) ×4 IMPLANT
APPLICATOR COTTON TIP 6IN STRL (MISCELLANEOUS) IMPLANT
ATTRACTOMAT 16X20 MAGNETIC DRP (DRAPES) ×4 IMPLANT
BAG DECANTER FOR FLEXI CONT (MISCELLANEOUS) ×4 IMPLANT
BANDAGE ELASTIC 4 VELCRO ST LF (GAUZE/BANDAGES/DRESSINGS) ×4 IMPLANT
BANDAGE ELASTIC 6 VELCRO ST LF (GAUZE/BANDAGES/DRESSINGS) ×4 IMPLANT
BANDAGE GAUZE ELAST BULKY 4 IN (GAUZE/BANDAGES/DRESSINGS) ×4 IMPLANT
BASKET HEART  (ORDER IN 25'S) (MISCELLANEOUS) ×1
BASKET HEART (ORDER IN 25'S) (MISCELLANEOUS) ×1
BASKET HEART (ORDER IN 25S) (MISCELLANEOUS) ×2 IMPLANT
BENZOIN TINCTURE PRP APPL 2/3 (GAUZE/BANDAGES/DRESSINGS) ×4 IMPLANT
BLADE STERNUM SYSTEM 6 (BLADE) ×4 IMPLANT
BLADE SURG 11 STRL SS (BLADE) ×4 IMPLANT
BLADE SURG 12 STRL SS (BLADE) ×4 IMPLANT
BLADE SURG 15 STRL LF DISP TIS (BLADE) ×2 IMPLANT
BLADE SURG 15 STRL SS (BLADE) ×2
BLADE SURG ROTATE 9660 (MISCELLANEOUS) IMPLANT
BOOT SUTURE AID YELLOW STND (SUTURE) IMPLANT
CANISTER SUCTION 2500CC (MISCELLANEOUS) ×4 IMPLANT
CANN PRFSN 3/8X14X24FR PCFC (MISCELLANEOUS) ×2
CANN PRFSN 3/8XRT ANG TPR 14 (MISCELLANEOUS) ×2
CANNULA AORTIC HI-FLOW 6.5M20F (CANNULA) ×4 IMPLANT
CANNULA ARTERIAL NVNT 3/8 20FR (MISCELLANEOUS) ×4 IMPLANT
CANNULA GUNDRY RCSP 15FR (MISCELLANEOUS) ×4 IMPLANT
CANNULA PRFSN 3/8X14X24FR PCFC (MISCELLANEOUS) ×2 IMPLANT
CANNULA PRFSN 3/8XRT ANG TPR14 (MISCELLANEOUS) ×2 IMPLANT
CANNULA VEN MTL TIP RT (MISCELLANEOUS) ×4
CANNULA VENOUS LOW PROF 32X40 (CANNULA) ×4 IMPLANT
CANNULA VENOUS MAL SGL STG 40 (MISCELLANEOUS) IMPLANT
CANNULAE VENOUS MAL SGL STG 40 (MISCELLANEOUS)
CATH CPB KIT VANTRIGT (MISCELLANEOUS) ×4 IMPLANT
CATH ROBINSON RED A/P 18FR (CATHETERS) ×16 IMPLANT
CATH THORACIC 28FR (CATHETERS) IMPLANT
CATH THORACIC 28FR RT ANG (CATHETERS) IMPLANT
CATH THORACIC 36FR (CATHETERS) IMPLANT
CATH THORACIC 36FR RT ANG (CATHETERS) ×8 IMPLANT
CLIP RETRACTION 3.0MM CORONARY (MISCELLANEOUS) ×4 IMPLANT
CLIP TI WIDE RED SMALL 24 (CLIP) ×8 IMPLANT
CLOSURE STERI-STRIP 1/2X4 (GAUZE/BANDAGES/DRESSINGS) ×1
CLSR STERI-STRIP ANTIMIC 1/2X4 (GAUZE/BANDAGES/DRESSINGS) ×3 IMPLANT
CONN 1/2X1/2X1/2  BEN (MISCELLANEOUS) ×2
CONN 1/2X1/2X1/2 BEN (MISCELLANEOUS) ×2 IMPLANT
CONN 3/8X1/2 ST GISH (MISCELLANEOUS) ×8 IMPLANT
COUNTER NEEDLE 20 DBL MAG RED (NEEDLE) ×4 IMPLANT
COVER SURGICAL LIGHT HANDLE (MISCELLANEOUS) ×4 IMPLANT
CRADLE DONUT ADULT HEAD (MISCELLANEOUS) ×4 IMPLANT
DRAIN CHANNEL 32F RND 10.7 FF (WOUND CARE) ×4 IMPLANT
DRAPE CARDIOVASCULAR INCISE (DRAPES) ×2
DRAPE SLUSH/WARMER DISC (DRAPES) ×4 IMPLANT
DRAPE SRG 135X102X78XABS (DRAPES) ×2 IMPLANT
DRSG AQUACEL AG ADV 3.5X14 (GAUZE/BANDAGES/DRESSINGS) ×4 IMPLANT
DRSG COVADERM 4X14 (GAUZE/BANDAGES/DRESSINGS) ×4 IMPLANT
ELECT BLADE 4.0 EZ CLEAN MEGAD (MISCELLANEOUS)
ELECT BLADE 6.5 EXT (BLADE) ×4 IMPLANT
ELECT CAUTERY BLADE 6.4 (BLADE) ×4 IMPLANT
ELECT REM PT RETURN 9FT ADLT (ELECTROSURGICAL) ×8
ELECTRODE BLDE 4.0 EZ CLN MEGD (MISCELLANEOUS) IMPLANT
ELECTRODE REM PT RTRN 9FT ADLT (ELECTROSURGICAL) ×4 IMPLANT
GLOVE BIO SURGEON STRL SZ 6 (GLOVE) ×20 IMPLANT
GLOVE BIO SURGEON STRL SZ 6.5 (GLOVE) IMPLANT
GLOVE BIO SURGEON STRL SZ7.5 (GLOVE) ×12 IMPLANT
GLOVE BIO SURGEONS STRL SZ 6.5 (GLOVE)
GLOVE BIOGEL PI IND STRL 6 (GLOVE) ×16 IMPLANT
GLOVE BIOGEL PI IND STRL 7.0 (GLOVE) ×12 IMPLANT
GLOVE BIOGEL PI INDICATOR 6 (GLOVE) ×16
GLOVE BIOGEL PI INDICATOR 7.0 (GLOVE) ×12
GOWN STRL NON-REIN LRG LVL3 (GOWN DISPOSABLE) ×28 IMPLANT
HEMOSTAT POWDER SURGIFOAM 1G (HEMOSTASIS) ×12 IMPLANT
HEMOSTAT SURGICEL 2X14 (HEMOSTASIS) ×4 IMPLANT
INSERT FOGARTY XLG (MISCELLANEOUS) IMPLANT
KIT BASIN OR (CUSTOM PROCEDURE TRAY) ×4 IMPLANT
KIT ROOM TURNOVER OR (KITS) ×4 IMPLANT
KIT SUCTION CATH 14FR (SUCTIONS) ×4 IMPLANT
KIT VASOVIEW W/TROCAR VH 2000 (KITS) ×4 IMPLANT
LEAD PACING MYOCARDI (MISCELLANEOUS) ×4 IMPLANT
LINE VENT (MISCELLANEOUS) ×4 IMPLANT
LOOP VESSEL SUPERMAXI WHITE (MISCELLANEOUS) ×4 IMPLANT
MARKER GRAFT CORONARY BYPASS (MISCELLANEOUS) ×20 IMPLANT
NS IRRIG 1000ML POUR BTL (IV SOLUTION) ×24 IMPLANT
PACK OPEN HEART (CUSTOM PROCEDURE TRAY) ×4 IMPLANT
PAD ARMBOARD 7.5X6 YLW CONV (MISCELLANEOUS) ×8 IMPLANT
PAD ELECT DEFIB RADIOL ZOLL (MISCELLANEOUS) ×4 IMPLANT
PENCIL BUTTON HOLSTER BLD 10FT (ELECTRODE) ×4 IMPLANT
PUNCH AORTIC ROTATE 4.0MM (MISCELLANEOUS) ×4 IMPLANT
PUNCH AORTIC ROTATE 4.5MM 8IN (MISCELLANEOUS) IMPLANT
PUNCH AORTIC ROTATE 5MM 8IN (MISCELLANEOUS) IMPLANT
RING PHYSIO MITRAL M24 (Prosthesis & Implant Heart) ×4 IMPLANT
SET CARDIOPLEGIA MPS 5001102 (MISCELLANEOUS) ×4 IMPLANT
SPONGE GAUZE 4X4 12PLY (GAUZE/BANDAGES/DRESSINGS) ×8 IMPLANT
SPONGE LAP 18X18 X RAY DECT (DISPOSABLE) ×8 IMPLANT
SPONGE LAP 4X18 X RAY DECT (DISPOSABLE) ×4 IMPLANT
STOPCOCK 4 WAY LG BORE MALE ST (IV SETS) ×4 IMPLANT
SUCKER WEIGHTED FLEX (MISCELLANEOUS) ×4 IMPLANT
SURGIFLO W/THROMBIN 8M KIT (HEMOSTASIS) ×4 IMPLANT
SUT BONE WAX W31G (SUTURE) ×4 IMPLANT
SUT ETHIBOND (SUTURE) ×8 IMPLANT
SUT ETHIBOND 2 0 SH (SUTURE) ×10 IMPLANT
SUT ETHIBOND 2 0 SH 36X2 (SUTURE) ×10 IMPLANT
SUT ETHIBOND 2 0 V4 (SUTURE) IMPLANT
SUT ETHIBOND 2 0V4 GREEN (SUTURE) IMPLANT
SUT ETHIBOND 2-0 RB-1 WHT (SUTURE) ×8 IMPLANT
SUT ETHIBOND 4 0 TF (SUTURE) IMPLANT
SUT ETHIBOND 5 0 C 1 30 (SUTURE) ×16 IMPLANT
SUT MNCRL AB 4-0 PS2 18 (SUTURE) ×4 IMPLANT
SUT PROLENE 3 0 RB 1 (SUTURE) ×4 IMPLANT
SUT PROLENE 3 0 SH 1 (SUTURE) ×4 IMPLANT
SUT PROLENE 3 0 SH DA (SUTURE) ×16 IMPLANT
SUT PROLENE 3 0 SH1 36 (SUTURE) ×24 IMPLANT
SUT PROLENE 4 0 RB 1 (SUTURE) ×10
SUT PROLENE 4 0 SH DA (SUTURE) ×8 IMPLANT
SUT PROLENE 4-0 RB1 .5 CRCL 36 (SUTURE) ×10 IMPLANT
SUT PROLENE 5 0 C 1 36 (SUTURE) IMPLANT
SUT PROLENE 6 0 C 1 30 (SUTURE) ×8 IMPLANT
SUT PROLENE 6 0 CC (SUTURE) ×16 IMPLANT
SUT PROLENE 7 0 DA (SUTURE) IMPLANT
SUT PROLENE 7.0 RB 3 (SUTURE) ×12 IMPLANT
SUT PROLENE 8 0 BV175 6 (SUTURE) ×8 IMPLANT
SUT PROLENE BLUE 7 0 (SUTURE) ×8 IMPLANT
SUT SILK  1 MH (SUTURE)
SUT SILK 1 MH (SUTURE) IMPLANT
SUT SILK 2 0 SH CR/8 (SUTURE) ×4 IMPLANT
SUT SILK 3 0 SH CR/8 (SUTURE) ×4 IMPLANT
SUT STEEL 6MS V (SUTURE) ×16 IMPLANT
SUT STEEL SZ 6 DBL 3X14 BALL (SUTURE) ×4 IMPLANT
SUT VIC AB 1 CTX 36 (SUTURE) ×4
SUT VIC AB 1 CTX36XBRD ANBCTR (SUTURE) ×4 IMPLANT
SUT VIC AB 2-0 CT1 27 (SUTURE) ×2
SUT VIC AB 2-0 CT1 TAPERPNT 27 (SUTURE) ×2 IMPLANT
SUT VIC AB 2-0 CTX 27 (SUTURE) IMPLANT
SUT VIC AB 3-0 SH 27 (SUTURE)
SUT VIC AB 3-0 SH 27X BRD (SUTURE) IMPLANT
SUT VIC AB 3-0 X1 27 (SUTURE) IMPLANT
SUT VICRYL 4-0 PS2 18IN ABS (SUTURE) IMPLANT
SUTURE E-PAK OPEN HEART (SUTURE) ×4 IMPLANT
SYSTEM SAHARA CHEST DRAIN ATS (WOUND CARE) ×8 IMPLANT
TAPE CLOTH SURG 4X10 WHT LF (GAUZE/BANDAGES/DRESSINGS) ×4 IMPLANT
TAPE PAPER 2X10 WHT MICROPORE (GAUZE/BANDAGES/DRESSINGS) ×4 IMPLANT
TOWEL OR 17X24 6PK STRL BLUE (TOWEL DISPOSABLE) ×8 IMPLANT
TOWEL OR 17X26 10 PK STRL BLUE (TOWEL DISPOSABLE) ×8 IMPLANT
TRAY CATH LUMEN 1 20CM STRL (SET/KITS/TRAYS/PACK) ×4 IMPLANT
TRAY FOLEY IC TEMP SENS 14FR (CATHETERS) ×4 IMPLANT
TUBING ART PRESS 48 MALE/FEM (TUBING) ×8 IMPLANT
TUBING INSUFFLATION 10FT LAP (TUBING) ×4 IMPLANT
UNDERPAD 30X30 INCONTINENT (UNDERPADS AND DIAPERS) ×8 IMPLANT
WATER STERILE IRR 1000ML POUR (IV SOLUTION) ×8 IMPLANT

## 2013-05-31 NOTE — Anesthesia Postprocedure Evaluation (Signed)
  Anesthesia Post-op Note  [name]  Procedure(s) Performed: Procedure(s): Coronary Artery Bypass Grafting Times Three Using Left Internal Mammary Artery and Right Saphenous Leg Vein Harvested Endoscopically (N/A) MITRAL VALVE REPAIR (MVR) (N/A) INTRAOPERATIVE TRANSESOPHAGEAL ECHOCARDIOGRAM (N/A)  Patient Location: SICU  Anesthesia Type:General  Level of Consciousness: sedated and Patient remains intubated per anesthesia plan  Airway and Oxygen Therapy: Patient remains intubated per anesthesia plan and Patient placed on Ventilator (see vital sign flow sheet for setting)  Post-op Pain: none  Post-op Assessment: Post-op Vital signs reviewed, Patient's Cardiovascular Status Stable, Respiratory Function Stable, Patent Airway, No signs of Nausea or vomiting and Pain level controlled  Post-op Vital Signs: stable  Complications: No apparent anesthesia complications

## 2013-05-31 NOTE — Progress Notes (Signed)
Patient ID: Gabriela [name], female   DOB: 02-Jan-1926, 79 y.o.   MRN: [id number]   SICU Evening Rounds:   Hemodynamically stable  CI = 2.98 on dop 3, milrinone 3  Has started to wake up on vent.  Urine output good  CT output low  CBC    Component Value Date/Time   WBC 11.5* 05/31/2013 1650   RBC 3.08* 05/31/2013 1650   HGB 8.7* 05/31/2013 1650   HCT 26.4* 05/31/2013 1650   PLT 148* 05/31/2013 1650   MCV 85.7 05/31/2013 1650   MCH 28.2 05/31/2013 1650   MCHC 33.0 05/31/2013 1650   RDW 14.4 05/31/2013 1650     BMET    Component Value Date/Time   NA 141 05/31/2013 1622   K 3.8 05/31/2013 1622   CL 105 05/28/2013 1329   CO2 23 05/28/2013 1329   GLUCOSE 84 05/31/2013 1622   BUN 31* 05/28/2013 1329   CREATININE 1.21* 05/28/2013 1329   CALCIUM 9.5 05/28/2013 1329   GFRNONAA 40* 05/28/2013 1329   GFRAA 46* 05/28/2013 1329     A/P:  Stable postop course. Continue current plans.

## 2013-05-31 NOTE — Progress Notes (Signed)
The patient was examined and preop studies reviewed. There has been no change from the prior exam and the patient is ready for surgery.  Plan CABG with poss MVR on L [place] today

## 2013-05-31 NOTE — OR Nursing (Signed)
1430 first call made to SICU as well as volunteer desk to notify family that pt was off bypass.  1523 second call made to SICU

## 2013-05-31 NOTE — Transfer of Care (Signed)
Immediate Anesthesia Transfer of Care Note  [name]  Procedure(s) Performed: Procedure(s): Coronary Artery Bypass Grafting Times Three Using Left Internal Mammary Artery and Right Saphenous Leg Vein Harvested Endoscopically (N/A) MITRAL VALVE REPAIR (MVR) (N/A) INTRAOPERATIVE TRANSESOPHAGEAL ECHOCARDIOGRAM (N/A)  Patient Location: SICU  Anesthesia Type:General  Level of Consciousness: Patient remains intubated per anesthesia plan  Airway & Oxygen Therapy: Patient remains intubated per anesthesia plan and Patient placed on Ventilator (see vital sign flow sheet for setting)  Post-op Assessment: Report given to PACU RN and Post -op Vital signs reviewed and stable  Post vital signs: Reviewed and stable  Complications: No apparent anesthesia complications

## 2013-05-31 NOTE — Brief Op Note (Signed)
05/31/2013  4:51 PM  PATIENT:  Gabriela [name]  79 y.o. female  PRE-OPERATIVE DIAGNOSIS:  1. Coronary Artery Disease 2. Mitral Regurgitation   POST-OPERATIVE DIAGNOSIS:  1. Coronary Artery Disease 2.Mitral Regurgitation  PROCEDURE: INTRAOPERATIVE TRANSESOPHAGEAL ECHOCARDIOGRAM, MEDIAN STERNOTOMY for CABG x 3 (LIMA to LAD, SVG to OM1, SVG to RCA) with EVH from the right, and MITRAL VALVE REPAIR (MVR)  (using an annuloplasty ring, size 24 mm)  SURGEON:  Surgeon(s) and Role:    * Ivin Poot, MD - Primary  PHYSICIAN ASSISTANT: Suzzanne Cloud PA-C  ANESTHESIA:   general  EBL:  Total I/O In: 4284 [I.V.:3000; Blood:1034; IV Piggyback:250] Out: 2950 [Urine:2150; Blood:800]   DRAINS: Chest Tube(s) in the Mediastinal and pleural spaces    COUNTS CORRECT:  YES  DICTATION: .Dragon Dictation  PLAN OF CARE: Admit to inpatient   PATIENT DISPOSITION:  ICU - intubated and hemodynamically stable.   Delay start of Pharmacological VTE agent (>24hrs) due to surgical blood loss or risk of bleeding: yes  PRE OP WEIGHT: 67 kg

## 2013-05-31 NOTE — Anesthesia Procedure Notes (Signed)
Procedure Name: Intubation Date/Time: 05/31/2013 8:46 AM Performed by: Sherilyn Banker Pre-anesthesia Checklist: Patient identified, Emergency Drugs available, Suction available, Patient being monitored and Timeout performed Patient Re-evaluated:Patient Re-evaluated prior to inductionOxygen Delivery Method: Circle system utilized Preoxygenation: Pre-oxygenation with 100% oxygen Intubation Type: IV induction Ventilation: Mask ventilation without difficulty Laryngoscope Size: Mac and 3 Grade View: Grade II Tube type: Oral Tube size: 8.0 mm Number of attempts: 1 Airway Equipment and Method: Stylet Placement Confirmation: ETT inserted through vocal cords under direct vision,  positive ETCO2 and breath sounds checked- equal and bilateral Secured at: 22 cm Dental Injury: Teeth and Oropharynx as per pre-operative assessment

## 2013-05-31 NOTE — Anesthesia Preprocedure Evaluation (Signed)
Anesthesia Evaluation  Patient identified by MRN, date of birth, ID band Patient awake    Reviewed: Allergy & Precautions, H&P , NPO status , Patient's Chart, lab work & pertinent test results  Airway       Dental   Pulmonary shortness of breath,          Cardiovascular hypertension, + CAD     Neuro/Psych    GI/Hepatic   Endo/Other  diabetes, Type 2, Insulin Dependent  Renal/GU      Musculoskeletal   Abdominal   Peds  Hematology   Anesthesia Other Findings   Reproductive/Obstetrics                           Anesthesia Physical Anesthesia Plan  ASA: III  Anesthesia Plan: General   Post-op Pain Management:    Induction: Intravenous  Airway Management Planned: Oral ETT  Additional Equipment: Arterial line, CVP, PA Cath and TEE  Intra-op Plan:   Post-operative Plan: Extubation in OR  Informed Consent: I have reviewed the patients History and Physical, chart, labs and discussed the procedure including the risks, benefits and alternatives for the proposed anesthesia with the patient or authorized representative who has indicated his/her understanding and acceptance.     Plan Discussed with: CRNA, Anesthesiologist and Surgeon  Anesthesia Plan Comments:         Anesthesia Quick Evaluation

## 2013-05-31 NOTE — Preoperative (Signed)
Beta Blockers   Reason not to administer Beta Blockers:Coreg taken this am

## 2013-06-01 ENCOUNTER — Inpatient Hospital Stay (HOSPITAL_COMMUNITY): Payer: Medicare Other

## 2013-06-01 LAB — MAGNESIUM
Magnesium: 2.2 mg/dL (ref 1.5–2.5)
Magnesium: 2.2 mg/dL (ref 1.5–2.5)

## 2013-06-01 LAB — GLUCOSE, CAPILLARY
Glucose-Capillary: 100 mg/dL — ABNORMAL HIGH (ref 70–99)
Glucose-Capillary: 109 mg/dL — ABNORMAL HIGH (ref 70–99)
Glucose-Capillary: 104 mg/dL — ABNORMAL HIGH (ref 70–99)
Glucose-Capillary: 95 mg/dL (ref 70–99)
Glucose-Capillary: 100 mg/dL — ABNORMAL HIGH (ref 70–99)
Glucose-Capillary: 111 mg/dL — ABNORMAL HIGH (ref 70–99)
Glucose-Capillary: 99 mg/dL (ref 70–99)
Glucose-Capillary: 108 mg/dL — ABNORMAL HIGH (ref 70–99)
Glucose-Capillary: 115 mg/dL — ABNORMAL HIGH (ref 70–99)
Glucose-Capillary: 110 mg/dL — ABNORMAL HIGH (ref 70–99)
Glucose-Capillary: 100 mg/dL — ABNORMAL HIGH (ref 70–99)
Glucose-Capillary: 94 mg/dL (ref 70–99)
Glucose-Capillary: 108 mg/dL — ABNORMAL HIGH (ref 70–99)

## 2013-06-01 LAB — POCT I-STAT 3, ART BLOOD GAS (G3+)
Acid-base deficit: 3 mmol/L — ABNORMAL HIGH (ref 0.0–2.0)
Acid-base deficit: 2 mmol/L (ref 0.0–2.0)
Bicarbonate: 20.9 mEq/L (ref 20.0–24.0)
Bicarbonate: 22 mEq/L (ref 20.0–24.0)
O2 Saturation: 99 %
O2 Saturation: 98 %
Patient temperature: 97.8
Patient temperature: 98.1
TCO2: 22 mmol/L (ref 0–100)
TCO2: 23 mmol/L (ref 0–100)
pCO2 arterial: 31.4 mmHg — ABNORMAL LOW (ref 35.0–45.0)
pCO2 arterial: 32 mmHg — ABNORMAL LOW (ref 35.0–45.0)
pH, Arterial: 7.43 (ref 7.350–7.450)
pH, Arterial: 7.443 (ref 7.350–7.450)
pO2, Arterial: 141 mmHg — ABNORMAL HIGH (ref 80.0–100.0)
pO2, Arterial: 106 mmHg — ABNORMAL HIGH (ref 80.0–100.0)

## 2013-06-01 LAB — BASIC METABOLIC PANEL
BUN: 20 mg/dL (ref 6–23)
CO2: 21 mEq/L (ref 19–32)
Chloride: 108 mEq/L (ref 96–112)
GFR calc Af Amer: 64 mL/min — ABNORMAL LOW (ref >90)
GFR calc non Af Amer: 55 mL/min — ABNORMAL LOW (ref >90)
Glucose, Bld: 111 mg/dL — ABNORMAL HIGH (ref 70–99)
Potassium: 3.9 mEq/L (ref 3.5–5.1)

## 2013-06-01 LAB — POCT I-STAT, CHEM 8
BUN: 20 mg/dL (ref 6–23)
Calcium, Ion: 1.28 mmol/L (ref 1.13–1.30)
Chloride: 106 mEq/L (ref 96–112)
Creatinine, Ser: 1.2 mg/dL — ABNORMAL HIGH (ref 0.50–1.10)
Glucose, Bld: 110 mg/dL — ABNORMAL HIGH (ref 70–99)
HCT: 32 % — ABNORMAL LOW (ref 36.0–46.0)
Hemoglobin: 10.9 g/dL — ABNORMAL LOW (ref 12.0–15.0)
Potassium: 4.3 mEq/L (ref 3.5–5.1)
Sodium: 141 mEq/L (ref 135–145)
TCO2: 19 mmol/L (ref 0–100)

## 2013-06-01 LAB — CREATININE, SERUM
Creatinine, Ser: 1.01 mg/dL (ref 0.50–1.10)
GFR calc Af Amer: 57 mL/min — ABNORMAL LOW (ref >90)
GFR calc non Af Amer: 49 mL/min — ABNORMAL LOW (ref >90)

## 2013-06-01 LAB — CBC
HCT: 27.1 % — ABNORMAL LOW (ref 36.0–46.0)
HCT: 27.5 % — ABNORMAL LOW (ref 36.0–46.0)
Hemoglobin: 9.4 g/dL — ABNORMAL LOW (ref 12.0–15.0)
MCH: 29.1 pg (ref 26.0–34.0)
MCHC: 34.7 g/dL (ref 30.0–36.0)
MCHC: 33.5 g/dL (ref 30.0–36.0)
MCV: 83.9 fL (ref 78.0–100.0)
MCV: 84.6 fL (ref 78.0–100.0)
Platelets: 115 10*3/uL — ABNORMAL LOW (ref 150–400)
RBC: 3.23 MIL/uL — ABNORMAL LOW (ref 3.87–5.11)
RBC: 3.25 MIL/uL — ABNORMAL LOW (ref 3.87–5.11)
RDW: 14.2 % (ref 11.5–15.5)
RDW: 14.7 % (ref 11.5–15.5)
WBC: 17.1 10*3/uL — ABNORMAL HIGH (ref 4.0–10.5)
WBC: 19.3 10*3/uL — ABNORMAL HIGH (ref 4.0–10.5)

## 2013-06-01 LAB — PREPARE PLATELET PHERESIS

## 2013-06-01 LAB — PREPARE FRESH FROZEN PLASMA: Unit division: 0

## 2013-06-01 MED ORDER — INSULIN DETEMIR 100 UNIT/ML ~~LOC~~ SOLN
15.0000 [IU] | Freq: Once | SUBCUTANEOUS | Status: AC
  Administered 2013-06-01: 15 [IU] via SUBCUTANEOUS
  Filled 2013-06-01 (×2): qty 0.15

## 2013-06-01 MED ORDER — INSULIN DETEMIR 100 UNIT/ML ~~LOC~~ SOLN
15.0000 [IU] | Freq: Every day | SUBCUTANEOUS | Status: DC
  Administered 2013-06-02 – 2013-06-04 (×3): 15 [IU] via SUBCUTANEOUS
  Filled 2013-06-01 (×4): qty 0.15

## 2013-06-01 MED ORDER — INSULIN ASPART 100 UNIT/ML ~~LOC~~ SOLN
0.0000 [IU] | SUBCUTANEOUS | Status: DC
  Administered 2013-06-02 – 2013-06-04 (×6): 2 [IU] via SUBCUTANEOUS
  Administered 2013-06-04: 8 [IU] via SUBCUTANEOUS
  Administered 2013-06-04 (×2): 2 [IU] via SUBCUTANEOUS
  Administered 2013-06-05: 4 [IU] via SUBCUTANEOUS

## 2013-06-01 NOTE — Procedures (Signed)
Extubation Procedure Note  Patient Details:   Name: Gabriela [name] DOB: 06-06-1926 MRN: [id number]   Airway Documentation:     Evaluation  O2 sats: stable throughout Complications: No apparent complications Patient did tolerate procedure well. Bilateral Breath Sounds: Clear Suctioning: Airway Yes  NIF -24 cmH20 FVC .790L  Patient extubated to [facility].  Positive cuff leak prior to extubation.  ETT suctioned and deep oral suction prior to extubation.  Patient coughed small yellow/tan sputum post-extubation.  Patient vocalized and no stridor noted.  Clear BBS and VSS.  Incentive spirometry and pillow cough introduced.  Theo Dills 06/01/2013, 5:52 AM

## 2013-06-01 NOTE — Op Note (Signed)
Gabriela [name], Gabriela [name] NO.:  [id number]  MEDICAL RECORD NO.:  [id number]  LOCATION:  2S10C                        FACILITY:  [facility]  PHYSICIAN:  Ivin Poot, M.D.  DATE OF BIRTH:  1925/12/27  DATE OF PROCEDURE:  05/31/2013 DATE OF DISCHARGE:                              OPERATIVE REPORT   OPERATION: 1. Coronary artery bypass grafting x3 (left internal mammary artery to     LAD, saphenous vein graft to RCA, saphenous vein graft to OM1). 2. Endoscopic harvest of right leg greater saphenous vein. 3. Mitral valve repair using a 24-mm Edwards Physio II Annuloplasty     ring--serial [id number], and leaflet plasty. 4. Placement of left femoral artery A-line for blood pressure     monitoring.  SURGEON:  Ivin Poot, M.D.  ASSISTANT:  Suzzanne Cloud, PA-C.  PREOPERATIVE DIAGNOSIS:  Severe 3-vessel coronary artery disease, moderate LV dysfunction, ischemic mitral regurgitation.  POSTOPERATIVE DIAGNOSIS:  Severe 3-vessel coronary artery disease, moderate LV dysfunction, ischemic mitral regurgitation.  CLINICAL NOTE:  The patient is an 79 year old African American female, who is evaluated by her cardiologist for progressing shortness of breath with exertion and mild chest discomfort.  A stress test was abnormal and cardiac catheterization demonstrated significant 3 vessel coronary artery disease.  Next, a 2D echo showed EF of 30% with inferior wall hypokinesia and moderate-to-severe mitral regurgitation.  The patient was felt to be a potential surgical candidate, and I evaluated the patient in the office after reviewing the results of her cardiac catheterization and echocardiogram.  I reviewed the indications and expected benefits of coronary artery bypass grafting and mitral valve annuloplasty repair for treatment of her severe three-vessel coronary artery disease and significant mitral regurgitation.  I reviewed with her and her family the major aspects of the  procedure including the use of general anesthesia and cardiopulmonary bypass, the location of the surgical incisions, and the expected postoperative hospital recovery.  I reviewed with the patient and her family, the risks to the patient of CABG and mitral valve repair including risks of bleeding, blood transfusion requirement, stroke, MI, postoperative infection, and death. After reviewing these issues, she demonstrated her understanding and agreed to proceed with surgery under what I felt was an informed consent.  FINDINGS: 1. Intramyocardial LAD. 2. Severe diffuse coronary disease in a diabetic pattern. 3. Successful repair of the mitral valve regurgitation with a     annuloplasty ring and leaflet commissuroplasty with no residual     mitral regurgitation. 4. Intraoperative anemia.  Hemoglobin is 7.1, requiring 2 units of     packed cell transfusion.  OPERATIVE PROCEDURE:  The patient was brought to the operating room and placed supine on the operating table where general anesthesia was induced under invasive hemodynamic monitoring.  A transesophageal echo probe was placed by the anesthesiologist.  This confirmed the preoperative diagnosis of moderate LV global dysfunction and moderate mitral regurgitation.  The patient was prepped and draped in a sterile field.  A proper time-out was performed. A femoral A-line was placed in the left femoral artery for blood pressure monitoring.  A sternal incision was made as the saphenous vein was harvested endoscopically from the right leg.  The left internal mammary artery was harvested as a pedicle graft from its origin at the subclavian vessels. It was somewhat tortuous due to history of severe hypertension.  She had good flow.  The sternal retractor was then placed and the pericardium opened and suspended.  Pursestrings were placed in the ascending aorta, and superior vena cava.  Heparin was administered.  The ACT was documented as  being therapeutic.  The patient was then cannulated and placed on cardiopulmonary bypass.  A 2nd pursestring was placed low in the right atrium above the inferior vena cava.  A 2nd cannula was placed at that point.  Bicaval drainage being established, caval tapes were placed around the IVC and SVC.  The interatrial groove was dissected out.  The distal coronary vessels were identified for grafting.  It became apparent the LAD was deeply intramyocardial and was identified after cardioplegic arrest.  The mammary artery and vein grafts were prepared for the distal anastomoses and cardioplegia cannula was replaced for both antegrade and retrograde cold blood cardioplegia.  The patient was then cooled to 32 degrees and aortic cross-clamp was applied.  An 800 mL of cold blood cardioplegia was delivered in split doses between the antegrade aortic and retrograde coronary sinus catheters.  There was good cardioplegic arrest and septal temperature dropped less than 10 degrees. Cardioplegia was delivered every 20 minutes or less while the crossclamp was in place.  The distal coronary anastomoses were then performed.  The first distal anastomosis was the RCA.  The distal right coronary vessels were small and nongraftable.  The RCA had a proximal ostial high-grade stenosis.  A reverse saphenous vein was sewn to the distal main RCA, which is 1.5-mm vessel, and there was good flow through the graft, which was constructed with a running 7-0 Prolene.  The 2nd distal anastomosis was placed to the OM1 branch of left circumflex.  This was a 1.2-mm vessel proximal 80% stenosis.  A reverse saphenous vein was sewn using running 7-0 plain and there was good flow through the graft.  Cardioplegia was redosed.  The 3rd distal anastomosis was placed to the mid-to-distal third of the LAD.  The LAD was intramyocardial.  There was 1.5-mm vessel with a proximal 90% stenosis.  The left IMA pedicle was brought  through an opening created in the left lateral pericardium, it was brought down onto the LAD and sewn end-to-side with running 8-0 Prolene.  There was good flow through the anastomosis after briefly releasing the pedicle bulldog on the mammary artery.  The bulldog was reapplied and the pedicle was secured to the epicardium with 6-0 Prolene.  Cardioplegia was redosed.  Attention was then directed to the mitral valve.  An incision was made in the left atrium and the retractors were positioned.  Exposure of the mitral valve was good.  The valve had no obvious structural abnormalities, no elongated chords, prolapsed, or torn leaflets.  A saline test showed significant central regurgitation slightly located to the posterior commissure.  Next, annular sutures of 2-0 Ethibond were placed around the anulus in order to facilitate exposure of the valve.  A saline test after placing 15 annuloplasty sutures showed improved regurgitation, but some remained centrally.  The anterior leaflet was sized to a 24-mm Physio II Annuloplasty ring.  The annular sutures were then placed through the Annuloplasty ring and the ring was seated and the sutures were tied.  A saline test showed some minimal regurgitation in the area of the posterior commissure and a  5-0 Ethibond sutures placed to commissuroplasty and this resolved the regurgitation.  The atriotomy was then closed using running 3-0 Prolene in 2 layers.  It should be noted the operative field was insufflated with CO2 during the procedure.  After cardioplegia was redosed.  The 2 proximal vein anastomoses were constructed using a 4.0 mm punch and running 6-0 Prolene.  Prior to tying down the final proximal anastomosis, air was vented from the coronaries with a dose of retrograded warm blood cardioplegia.  The crossclamp was then removed.  The heart resumed a spontaneous rhythm.  The proximal distal coronary anastomoses were checked and found to be  hemostatic.  The vein grafts were de-aired.  The patient was rewarmed and reperfused.  Temporary pacing wires were applied.  The patient was adequately reperfused and rewarmed, the ventilator was resumed.  After the lungs re-expanded.  The patient was then weaned off cardiopulmonary bypass on low-dose milrinone.  The 2-D echocardiogram showed improved global LV function and no mitral regurgitation.  EKG showed sinus rhythm, ST-segment changes.  The patient was observed carefully and remained stable off cardiopulmonary bypass.  Protamine was then administered.  There was still diffuse coagulopathy and platelet count returned low at 90,000. The patient was then transfused with platelets, which improved the coagulation function.  The cannulas had been removed and the cannulation sites were checked for hemostasis.  The mediastinum was irrigated with warm saline.  Anterior mediastinal right pleural and left pleural chest tube were placed and brought through separate incisions.  The pericardium was closed superiorly over the aorta and vein grafts.  The sternal wires were placed.  The pectoralis fascia was closed with a running #1 Vicryl.  The subcutaneous and skin layers were closed in running Vicryl and sterile dressings were applied.  Total cardiopulmonary bypass time was 182 minutes.     Ivin Poot, M.D.    PV/MEDQ  D:  05/31/2013  T:  06/01/2013  Job:  [id number]  cc:   Serafina Royals, MD

## 2013-06-01 NOTE — Progress Notes (Signed)
1 Day Post-Op Procedure(s) (LRB): Coronary Artery Bypass Grafting Times Three Using Left Internal Mammary Artery and Right Saphenous Leg Vein Harvested Endoscopically (N/A) MITRAL VALVE REPAIR (MVR) (N/A) INTRAOPERATIVE TRANSESOPHAGEAL ECHOCARDIOGRAM (N/A) Subjective:  No complaints  Objective: Vital signs in last 24 hours: Temp:  [93.4 F (34.1 C)-98.1 F (36.7 C)] 97.9 F (36.6 C) (10/18 0600) Pulse Rate:  [72-90] 81 (10/18 0700) Cardiac Rhythm:  [-] Normal sinus rhythm (10/18 0600) Resp:  [12-25] 20 (10/18 0700) BP: (89-127)/(43-70) 102/47 mmHg (10/18 0700) SpO2:  [100 %] 100 % (10/18 0700) Arterial Line BP: (93-144)/(46-68) 109/50 mmHg (10/18 0700) FiO2 (%):  [40 %-50 %] 40 % (10/18 0512) Weight:  [66 kg (145 lb 8.1 oz)-71.1 kg (156 lb 12 oz)] 71.1 kg (156 lb 12 oz) (10/18 0500)  Hemodynamic parameters for last 24 hours: PAP: (24-45)/(12-24) 44/16 mmHg CO:  [2.9 L/min-3.6 L/min] 3.2 L/min CI:  [1.7 L/min/m2-2.1 L/min/m2] 1.9 L/min/m2  Intake/Output from previous day: 10/17 0701 - 10/18 0700 In: 6609.1 [P.O.:30; I.V.:4225.1; Blood:1034; NG/GT:120; IV Piggyback:1200] Out: 5305 [Urine:4245; Blood:800; Chest Tube:260] Intake/Output this shift:    General appearance: alert and cooperative Neurologic: intact Heart: regular rate and rhythm, S1, S2 normal, no murmur, click, rub or gallop Lungs: clear to auscultation bilaterally Extremities: edema mild Wound: dressing dry  Lab Results:  Recent Labs  05/31/13 2155 06/01/13 0410  WBC 13.0* 17.1*  HGB 9.0* 9.4*  HCT 26.2* 27.1*  PLT 131* 146*   BMET:  Recent Labs  05/31/13 2148 05/31/13 2155 06/01/13 0410  NA 139  --  140  K 4.3  --  3.9  CL 106  --  108  CO2  --   --  21  GLUCOSE 167*  --  111*  BUN 20  --  20  CREATININE 1.10 0.86 0.92  CALCIUM  --   --  8.6    PT/INR:  Recent Labs  05/31/13 1650  LABPROT 17.7*  INR 1.50*   ABG    Component Value Date/Time   PHART 7.443 06/01/2013 0533   HCO3  22.0 06/01/2013 0533   TCO2 23 06/01/2013 0533   ACIDBASEDEF 2.0 06/01/2013 0533   O2SAT 98.0 06/01/2013 0533   CBG (last 3)   Recent Labs  06/01/13 0158 06/01/13 0403 06/01/13 0559  GLUCAP 100* 109* 104*    Assessment/Plan: S/P Procedure(s) (LRB): Coronary Artery Bypass Grafting Times Three Using Left Internal Mammary Artery and Right Saphenous Leg Vein Harvested Endoscopically (N/A) MITRAL VALVE REPAIR (MVR) (N/A) INTRAOPERATIVE TRANSESOPHAGEAL ECHOCARDIOGRAM (N/A) Mobilize Diabetes control d/c tubes/lines Continue foley due to patient in ICU and urinary output monitoring See progression orders   LOS: 1 day    BARTLE,BRYAN K 06/01/2013

## 2013-06-02 ENCOUNTER — Inpatient Hospital Stay (HOSPITAL_COMMUNITY): Payer: Medicare Other

## 2013-06-02 LAB — CBC
HCT: 24.5 % — ABNORMAL LOW (ref 36.0–46.0)
MCH: 28.8 pg (ref 26.0–34.0)
MCV: 85.1 fL (ref 78.0–100.0)
Platelets: 100 10*3/uL — ABNORMAL LOW (ref 150–400)
RBC: 2.88 MIL/uL — ABNORMAL LOW (ref 3.87–5.11)

## 2013-06-02 LAB — GLUCOSE, CAPILLARY
Glucose-Capillary: 114 mg/dL — ABNORMAL HIGH (ref 70–99)
Glucose-Capillary: 118 mg/dL — ABNORMAL HIGH (ref 70–99)
Glucose-Capillary: 124 mg/dL — ABNORMAL HIGH (ref 70–99)
Glucose-Capillary: 139 mg/dL — ABNORMAL HIGH (ref 70–99)
Glucose-Capillary: 97 mg/dL (ref 70–99)

## 2013-06-02 LAB — BASIC METABOLIC PANEL
CO2: 22 mEq/L (ref 19–32)
Calcium: 8.3 mg/dL — ABNORMAL LOW (ref 8.4–10.5)
Chloride: 106 mEq/L (ref 96–112)
Creatinine, Ser: 1.07 mg/dL (ref 0.50–1.10)
Glucose, Bld: 132 mg/dL — ABNORMAL HIGH (ref 70–99)
Potassium: 3.9 mEq/L (ref 3.5–5.1)
Sodium: 139 mEq/L (ref 135–145)

## 2013-06-02 MED ORDER — FUROSEMIDE 10 MG/ML IJ SOLN
40.0000 mg | Freq: Two times a day (BID) | INTRAMUSCULAR | Status: AC
  Administered 2013-06-02 (×2): 40 mg via INTRAVENOUS
  Filled 2013-06-02: qty 4

## 2013-06-02 MED ORDER — POTASSIUM CHLORIDE CRYS ER 20 MEQ PO TBCR
40.0000 meq | EXTENDED_RELEASE_TABLET | Freq: Two times a day (BID) | ORAL | Status: AC
  Administered 2013-06-02 (×2): 40 meq via ORAL
  Filled 2013-06-02: qty 2
  Filled 2013-06-02: qty 4

## 2013-06-02 NOTE — Progress Notes (Addendum)
2 Days Post-Op Procedure(s) (LRB): Coronary Artery Bypass Grafting Times Three Using Left Internal Mammary Artery and Right Saphenous Leg Vein Harvested Endoscopically (N/A) MITRAL VALVE REPAIR (MVR) (N/A) INTRAOPERATIVE TRANSESOPHAGEAL ECHOCARDIOGRAM (N/A) Subjective:  No complaints  Objective: Vital signs in last 24 hours: Temp:  [97.5 F (36.4 C)-99.2 F (37.3 C)] 99.2 F (37.3 C) (10/19 0803) Pulse Rate:  [70-86] 79 (10/19 0700) Cardiac Rhythm:  [-] Normal sinus rhythm (10/19 0745) Resp:  [21-38] 27 (10/19 0700) BP: (98-141)/(36-78) 113/44 mmHg (10/19 0700) SpO2:  [94 %-100 %] 100 % (10/19 0700) Arterial Line BP: (114-160)/(39-68) 120/39 mmHg (10/18 1600) Weight:  [70.3 kg (154 lb 15.7 oz)] 70.3 kg (154 lb 15.7 oz) (10/19 0600)  Hemodynamic parameters for last 24 hours: PAP: (43-52)/(18-22) 51/21 mmHg CO:  [3.8 L/min-4.5 L/min] 3.8 L/min CI:  [2.3 L/min/m2-2.7 L/min/m2] 2.3 L/min/m2  Intake/Output from previous day: 10/18 0701 - 10/19 0700 In: [id number] [P.O.:400; I.V.:1034; IV Piggyback:150] Out: 850 [Urine:720; Chest Tube:130] Intake/Output this shift:    General appearance: more alert today. she will keep her eyes open and interact. Neurologic: intact Heart: regular rate and rhythm, S1, S2 normal, no murmur, click, rub or gallop Lungs: diminished breath sounds bibasilar Extremities: edema mild Wound: incision ok  Lab Results:  Recent Labs  06/01/13 1700 06/01/13 1708 06/02/13 0354  WBC 19.3*  --  17.9*  HGB 9.2* 10.9* 8.3*  HCT 27.5* 32.0* 24.5*  PLT 115*  --  100*   BMET:  Recent Labs  06/01/13 0410  06/01/13 1708 06/02/13 0354  NA 140  --  141 139  K 3.9  --  4.3 3.9  CL 108  --  106 106  CO2 21  --   --  22  GLUCOSE 111*  --  110* 132*  BUN 20  --  20 24*  CREATININE 0.92  < > 1.20* 1.07  CALCIUM 8.6  --   --  8.3*  < > = values in this interval not displayed.  PT/INR:  Recent Labs  05/31/13 1650  LABPROT 17.7*  INR 1.50*   ABG     Component Value Date/Time   PHART 7.443 06/01/2013 0533   HCO3 22.0 06/01/2013 0533   TCO2 19 06/01/2013 1708   ACIDBASEDEF 2.0 06/01/2013 0533   O2SAT 98.0 06/01/2013 0533   CBG (last 3)   Recent Labs  06/02/13 0007 06/02/13 0432 06/02/13 0801  GLUCAP 139* 114* 97   CLINICAL DATA: Bypass surgery.  EXAM:  PORTABLE CHEST - 1 VIEW  COMPARISON: 06/01/2013.  FINDINGS:  The cardiac silhouette, mediastinal and hilar contours are stable.  The Swan-Ganz catheter has been removed and both chest tubes have  been removed. A small apical left pneumothorax is noted. The  mediastinal drain tubes been removed. There is minimal left basilar  atelectasis. No edema or effusions.  IMPRESSION:  Removal of of postoperative support apparatus except for the right  IJ central venous catheter.  Small left apical pneumothorax.  Minimal bibasilar atelectasis.  Electronically Signed  By: Kalman Jewels M.D.  On: 06/02/2013 07:42        Assessment/Plan: S/P Procedure(s) (LRB): Coronary Artery Bypass Grafting Times Three Using Left Internal Mammary Artery and Right Saphenous Leg Vein Harvested Endoscopically (N/A) MITRAL VALVE REPAIR (MVR) (N/A) INTRAOPERATIVE TRANSESOPHAGEAL ECHOCARDIOGRAM (N/A) Mobilize Diuresis Diabetes control Continue foley due to diuresing patient   LOS: 2 days    Davone Shinault K 06/02/2013

## 2013-06-02 NOTE — Progress Notes (Signed)
Patient ID: Gabriela [name], female   DOB: [birth year], 79 y.o.   MRN: [id number]  SICU Evening Rounds:  Hemodynamically stable  Urine output ok  Needs PT

## 2013-06-03 ENCOUNTER — Inpatient Hospital Stay (HOSPITAL_COMMUNITY): Payer: Medicare Other

## 2013-06-03 ENCOUNTER — Telehealth: Payer: Self-pay | Admitting: Cardiothoracic Surgery

## 2013-06-03 LAB — GLUCOSE, CAPILLARY
Glucose-Capillary: 133 mg/dL — ABNORMAL HIGH (ref 70–99)
Glucose-Capillary: 137 mg/dL — ABNORMAL HIGH (ref 70–99)
Glucose-Capillary: 140 mg/dL — ABNORMAL HIGH (ref 70–99)
Glucose-Capillary: 69 mg/dL — ABNORMAL LOW (ref 70–99)
Glucose-Capillary: 75 mg/dL (ref 70–99)
Glucose-Capillary: 84 mg/dL (ref 70–99)
Glucose-Capillary: 85 mg/dL (ref 70–99)
Glucose-Capillary: 94 mg/dL (ref 70–99)

## 2013-06-03 LAB — CBC
HCT: 24.3 % — ABNORMAL LOW (ref 36.0–46.0)
HCT: 28.2 % — ABNORMAL LOW (ref 36.0–46.0)
Hemoglobin: 8.1 g/dL — ABNORMAL LOW (ref 12.0–15.0)
Hemoglobin: 9.5 g/dL — ABNORMAL LOW (ref 12.0–15.0)
MCH: 28.6 pg (ref 26.0–34.0)
MCH: 28 pg (ref 26.0–34.0)
MCHC: 33.3 g/dL (ref 30.0–36.0)
MCHC: 33.7 g/dL (ref 30.0–36.0)
MCV: 83.2 fL (ref 78.0–100.0)
Platelets: 105 10*3/uL — ABNORMAL LOW (ref 150–400)
Platelets: 117 10*3/uL — ABNORMAL LOW (ref 150–400)
RBC: 3.39 MIL/uL — ABNORMAL LOW (ref 3.87–5.11)
RDW: 14.9 % (ref 11.5–15.5)
RDW: 16.6 % — ABNORMAL HIGH (ref 11.5–15.5)
WBC: 16.5 10*3/uL — ABNORMAL HIGH (ref 4.0–10.5)

## 2013-06-03 LAB — POCT I-STAT 4, (NA,K, GLUC, HGB,HCT)
Glucose, Bld: 140 mg/dL — ABNORMAL HIGH (ref 70–99)
HCT: 47 % — ABNORMAL HIGH (ref 36.0–46.0)
Hemoglobin: 16 g/dL — ABNORMAL HIGH (ref 12.0–15.0)
Potassium: 4.2 mEq/L (ref 3.5–5.1)
Sodium: 142 mEq/L (ref 135–145)

## 2013-06-03 LAB — CARBOXYHEMOGLOBIN
Carboxyhemoglobin: 1.5 % (ref 0.5–1.5)
Methemoglobin: 1.1 % (ref 0.0–1.5)
O2 Saturation: 68 %
Total hemoglobin: 8.4 g/dL — ABNORMAL LOW (ref 12.0–16.0)

## 2013-06-03 LAB — POCT I-STAT 3, ART BLOOD GAS (G3+)
Acid-base deficit: 5 mmol/L — ABNORMAL HIGH (ref 0.0–2.0)
Bicarbonate: 20.4 mEq/L (ref 20.0–24.0)
O2 Saturation: 100 % (ref 90.0–100.0)
TCO2: 21 mmol/L (ref 0–100)
pCO2 arterial: 36.7 mmHg (ref 35.0–45.0)
pH, Arterial: 7.353 (ref 7.350–7.400)
pO2, Arterial: 441 mmHg — ABNORMAL HIGH (ref 80.0–100.0)

## 2013-06-03 LAB — BASIC METABOLIC PANEL
BUN: 33 mg/dL — ABNORMAL HIGH (ref 6–23)
Calcium: 8.7 mg/dL (ref 8.4–10.5)
Chloride: 104 mEq/L (ref 96–112)
GFR calc Af Amer: 48 mL/min — ABNORMAL LOW (ref >90)
GFR calc non Af Amer: 41 mL/min — ABNORMAL LOW (ref >90)
Glucose, Bld: 79 mg/dL (ref 70–99)
Potassium: 3.9 mEq/L (ref 3.5–5.1)
Sodium: 136 mEq/L (ref 135–145)

## 2013-06-03 LAB — PREPARE RBC (CROSSMATCH)

## 2013-06-03 MED ORDER — AMIODARONE HCL 200 MG PO TABS
200.0000 mg | ORAL_TABLET | Freq: Every day | ORAL | Status: DC
  Administered 2013-06-03: 200 mg via ORAL
  Filled 2013-06-03 (×2): qty 1

## 2013-06-03 MED ORDER — FUROSEMIDE 10 MG/ML IJ SOLN
40.0000 mg | Freq: Two times a day (BID) | INTRAMUSCULAR | Status: DC
  Administered 2013-06-03: 40 mg via INTRAVENOUS
  Filled 2013-06-03 (×2): qty 4

## 2013-06-03 MED ORDER — AMIODARONE IV BOLUS ONLY 150 MG/100ML
150.0000 mg | Freq: Once | INTRAVENOUS | Status: AC
  Administered 2013-06-03: 150 mg via INTRAVENOUS
  Filled 2013-06-03: qty 100

## 2013-06-03 MED ORDER — FE FUMARATE-B12-VIT C-FA-IFC PO CAPS
1.0000 | ORAL_CAPSULE | Freq: Three times a day (TID) | ORAL | Status: DC
  Administered 2013-06-03 – 2013-06-08 (×16): 1 via ORAL
  Filled 2013-06-03 (×19): qty 1

## 2013-06-03 MED ORDER — DOPAMINE-DEXTROSE 3.2-5 MG/ML-% IV SOLN
2.0000 ug/kg/min | INTRAVENOUS | Status: DC
  Administered 2013-06-03: 2.5 ug/kg/min via INTRAVENOUS
  Filled 2013-06-03: qty 250

## 2013-06-03 MED ORDER — AMIODARONE HCL 200 MG PO TABS
400.0000 mg | ORAL_TABLET | Freq: Every day | ORAL | Status: DC
  Filled 2013-06-03: qty 2

## 2013-06-03 MED ORDER — AMIODARONE HCL 200 MG PO TABS
200.0000 mg | ORAL_TABLET | Freq: Two times a day (BID) | ORAL | Status: DC
  Administered 2013-06-03 – 2013-06-06 (×7): 200 mg via ORAL
  Filled 2013-06-03 (×9): qty 1

## 2013-06-03 MED ORDER — STARCH (THICKENING) PO POWD
ORAL | Status: DC | PRN
Start: 2013-06-03 — End: 2013-06-03
  Filled 2013-06-03: qty 227

## 2013-06-03 MED ORDER — FUROSEMIDE 10 MG/ML IJ SOLN
40.0000 mg | Freq: Two times a day (BID) | INTRAMUSCULAR | Status: DC
Start: 2013-06-03 — End: 2013-06-03

## 2013-06-03 MED ORDER — RESOURCE THICKENUP CLEAR PO POWD
ORAL | Status: DC | PRN
  Filled 2013-06-03: qty 125

## 2013-06-03 MED ORDER — AMIODARONE LOAD VIA INFUSION: 150.0000 mg | Freq: Once | INTRAVENOUS | Status: DC

## 2013-06-03 MED FILL — Sodium Bicarbonate IV Soln 8.4%: INTRAVENOUS | Qty: 50 | Status: AC

## 2013-06-03 MED FILL — Sodium Chloride Irrigation Soln 0.9%: Qty: 3000 | Status: AC

## 2013-06-03 MED FILL — Lidocaine HCl IV Inj 20 MG/ML: INTRAVENOUS | Qty: 5 | Status: AC

## 2013-06-03 MED FILL — Heparin Sodium (Porcine) Inj 1000 Unit/ML: INTRAMUSCULAR | Qty: 20 | Status: AC

## 2013-06-03 MED FILL — Mannitol IV Soln 20%: INTRAVENOUS | Qty: 500 | Status: AC

## 2013-06-03 MED FILL — Electrolyte-R (PH 7.4) Solution: INTRAVENOUS | Qty: 4000 | Status: AC

## 2013-06-03 MED FILL — Sodium Chloride IV Soln 0.9%: INTRAVENOUS | Qty: 1000 | Status: AC

## 2013-06-03 NOTE — Clinical Documentation Improvement (Signed)
THIS DOCUMENT IS NOT A PERMANENT PART OF THE MEDICAL RECORD  Please update your documentation with the medical record to reflect your response to this query. If you need help knowing how to do this please call [phone].  06/03/13   Dear Dr.[facility] Ker[facility],  In a better effort to capture your patient's severity of illness, reflect appropriate length of stay and utilization of resources, a review of the patient medical record has revealed the following indicators.    Based on your clinical judgment, please clarify and document in a progress note and/or discharge summary the clinical condition associated with the following supporting information:  In responding to this query please exercise your independent judgment.  The fact that a query is asked, does not imply that any particular answer is desired or expected.   Possible Clinical Conditions?  "     Atelectasis   "    Other Condition  "    Cannot Clinically Determine     Diagnostics: CXR: 10/18:  Slightly lower lung volumes with increased and bibasilar atelectasis. 10/19:  Improved atelectasis RUL.   You may use possible, probable, or suspect with inpatient documentation. possible, probable, suspected diagnoses MUST be documented at the time of discharge  Reviewed: documentation updated in today's progress note  Thank You,  Theron Arista, Clinical Documentation Specialist: [facility]

## 2013-06-03 NOTE — Evaluation (Signed)
Clinical/Bedside Swallow Evaluation Patient Details  Name: Gabriela [name] MRN: [id number] Date of Birth: Jan 21, 1926  Today's Date: 06/03/2013 Time: 1021-1033 SLP Time Calculation (min): 12 min  Past Medical History:  Past Medical History  Diagnosis Date  . Hypertension   . Diabetes mellitus   . Hyperlipidemia   . Arthritis   . Glaucoma   . Shortness of breath     with exertion   Past Surgical History:  Past Surgical History  Procedure Laterality Date  . Hip surgery      right  . Joint replacement    . Cholecystectomy    . Tonsillectomy    . Cardiac catheterization  04/22/13   HPI:  79 yr old admitted for Coronary Artery Bypass Grafting Times Three, MITRAL VALVE REPAIR (MVR).  PMH:  Hypertension, Diabetes mellitus, Hyperlipidemia, arthritis, Glaucoma.  Intubated 10/17-10/18.  RN states pt. coughing with liquids last night.  Pt. denied swallow difficulty prior to surgery and mildly increased coughing with liquids since CABG per pt.    Assessment / Plan / Recommendation Clinical Impression  Evidence of aspiration not present during this evaluation, however witnessed consistent coughing last night per RN.  Given reports of recent s/s aspiration, CABG and intubation which can both increase risk of aspiration, recommend diet modification to Dys 3 (precaution) and nectar thick liquids as a short term precaution.  SLP will follow to safely recommend return to regular diet texture and thin liquids when apporpriate.    Aspiration Risk  Moderate    Diet Recommendation Dysphagia 3 (Mechanical Soft);Nectar-thick liquid   Liquid Administration via: Cup;No straw Medication Administration: Whole meds with puree Supervision: Patient able to self feed;Intermittent supervision to cue for compensatory strategies Compensations: Slow rate;Small sips/bites Postural Changes and/or Swallow Maneuvers: Seated upright 90 degrees    Other  Recommendations Oral Care Recommendations: Oral care  BID Other Recommendations: Order thickener from pharmacy   Follow Up Recommendations   (TBD)    Frequency and Duration min 2x/week  2 weeks   Pertinent Vitals/Pain None         Swallow Study       Oral/Motor/Sensory Function Overall Oral Motor/Sensory Function: Appears within functional limits for tasks assessed   Ice Chips Ice chips: Not tested   Thin Liquid Thin Liquid: Within functional limits Presentation: Cup;Straw    Nectar Thick Nectar Thick Liquid: Not tested   Honey Thick Honey Thick Liquid: Not tested   Puree Puree: Within functional limits   Solid       Solid: Not tested       Houston Siren M.Ed [employer] [phone]  06/03/2013

## 2013-06-03 NOTE — Care Management Note (Unsigned)
Page 1 of 2   06/07/2013     1:33:24 PM   CARE MANAGEMENT NOTE 06/07/2013  Patient:  Gabriela [name], Gabriela [name]   Account Number:  [id number]  Date Initiated:  06/03/2013  Documentation initiated by:  Luz Lex  Subjective/Objective Assessment:   Post op MVR and CABG x3.     Action/Plan:   Anticipated DC Date:  06/08/2013   Anticipated DC Plan:  [facility] referral  Clinical Social Worker      DC Forensic scientist  CM consult      [facility] Choice  HOME HEALTH   Choice offered to / List presented to:  C-1 Patient        [facility] arranged  HH-1 RN  [place]   Status of service:  In process, will continue to follow Medicare Important Message given?   (If response is "NO", the following Medicare IM given date fields will be blank) Date Medicare IM given:   Date Additional Medicare IM given:    Discharge Disposition:  [facility]  Per UR Regulation:  Reviewed for med. necessity/level of care/duration of stay  If discussed at [place] of Stay Meetings, dates discussed:   06/06/2013    Comments:  Contact:  Roberts,Peggy Daughter     [phone]                 Rondel Oh [phone]  06/07/13 Jaliyah Fotheringham,RN,BSN [id number] SPOKE WITH PT'S DAUGHTER, [place], BY PHONE (CELL [phone]).  [employer]. EXPLAINED SERVICES THAT WILL BE PROVIDED, AND ESTIMATED # OF VISITS PER WEEK.  DAUGHTER STATES SHE WILL MAKE ARRANGEMENTS FOR 24H CARE AT HER HOME FOR PT, AS SHE WORKS 8A-5P DAILY.  SHE VERBALIZES UNDERSTANDING THAT PT WILL NEED 24H CARE FOR 10DAYS TO 2 WEEKS, AND STATES SHE DOES NOT WANT SNF PLACEMENT FOR HER MOM. DC ADDRESS:  [address], [place] [address]                          PHONE:  [phone]  06/06/13 Ysela Hettinger,RN,BSN [id number] PT/FAMILY NOW WOULD LIKE FOR PT TO DC HOME  WITH DAUGHTER PEGGY IN [place] (PHONE [phone]).  MET WITH PT AND OTHER DAUGHTER DIANNE, AND THEY AGREE WITH THIS PLAN.  LEFT HH AGENCY LIST FOR [facility] FOR DAUGHTER'S REFERENCE.  SHE PLANS TO VISIT THIS EVENING AFTER WORK.  WILL FOLLOW UP WITH PT/DAUGHTER ON HH SELECTION.  PT STATES SHE HAS RW, BSC, SHOWER CHAIR AND ELEV TOILET SEAT AT HOME.  06-03-13 3:30pm Luz Lex, RNBSN - [id number] Talked with daughter by phone.  Patient tired and new onset Afib today.  Daughter confirmed Mom lived and alone and was "fairly" independent prior to admission.   Will be at hospital today at about 4:15 and will continue this discussion for post discharge planning.  Talked with daughter and patient in room.  Both agree SNF rehab is best place.  Patient lives in [place] but daughter lives in [place].  Both agree would want [facility] facility.  Patient would also like pvt room. Consult placed to SW.  PT already ordered and suggesting SNF.

## 2013-06-03 NOTE — Progress Notes (Signed)
Nutrition Brief Note  Malnutrition Screening Tool result is inaccurate.  Please consult if nutrition needs are identified.  Katie Yaslyn Cumby, RD, LDN Pager #: [phone] After-Hours Pager #: [phone]  

## 2013-06-03 NOTE — Progress Notes (Signed)
Patient ID: Gabriela [name], female   DOB: 01/13/1926, 79 y.o.   MRN: [id number] EVENING ROUNDS NOTE :     [address]       [place] [address]             [phone]                 3 Days Post-Op Procedure(s) (LRB): Coronary Artery Bypass Grafting Times Three Using Left Internal Mammary Artery and Right Saphenous Leg Vein Harvested Endoscopically (N/A) MITRAL VALVE REPAIR (MVR) (N/A) INTRAOPERATIVE TRANSESOPHAGEAL ECHOCARDIOGRAM (N/A)  Total Length of Stay:  LOS: 3 days  BP 111/85  Pulse 70  Temp(Src) 98.2 F (36.8 C) (Oral)  Resp 25  Ht 5\' 3"  (1.6 m)  Wt 156 lb 8.4 oz (71 kg)  BMI 27.73 kg/m2  SpO2 99%  .Intake/Output     10/19 0701 - 10/20 0700 10/20 0701 - 10/21 0700   P.O. 370 160   I.V. (mL/kg) 643 (9.1) 254.1 (3.6)   Blood  337.5   IV Piggyback 50    Total Intake(mL/kg) 1063 (15) 751.6 (10.6)   Urine (mL/kg/hr) 1660 (1) 412 (0.6)   Chest Tube     Total Output 1660 412   Net -597 +339.6          . sodium chloride    . sodium chloride 20 mL (06/02/13 1700)  . sodium chloride 10 mL/hr at 05/31/13 1900  . DOPamine 2.5 mcg/kg/min (06/03/13 1600)  . insulin (NOVOLIN-R) infusion Stopped (06/01/13 1600)  . nitroGLYCERIN Stopped (05/31/13 2200)     Lab Results  Component Value Date   WBC 16.5* 06/03/2013   HGB 9.5* 06/03/2013   HCT 28.2* 06/03/2013   PLT 117* 06/03/2013   GLUCOSE 79 06/03/2013   ALT 10 05/28/2013   AST 20 05/28/2013   NA 136 06/03/2013   K 3.9 06/03/2013   CL 104 06/03/2013   CREATININE 1.17* 06/03/2013   BUN 33* 06/03/2013   CO2 23 06/03/2013   INR 1.50* 05/31/2013   HGBA1C 7.5* 05/28/2013   Now back in afib Got blood today On Cordarone po  Grace Isaac MD  Beeper [phone] Office [phone] 06/03/2013 5:27 PM

## 2013-06-03 NOTE — Progress Notes (Addendum)
[facility] DAILY ICU PROGRESS NOTE                   [address]            [place] [address]          [phone]   3 Days Post-Op Procedure(s) (LRB): Coronary Artery Bypass Grafting Times Three Using Left Internal Mammary Artery and Right Saphenous Leg Vein Harvested Endoscopically (N/A) MITRAL VALVE REPAIR (MVR) (N/A) INTRAOPERATIVE TRANSESOPHAGEAL ECHOCARDIOGRAM (N/A)  Total Length of Stay:  LOS: 3 days   Subjective: OOB in chair.  Still weak, not walking much.  RN reports pt coughs a lot after drinking liquids.     Objective: Vital signs in last 24 hours: Temp:  [98.2 F (36.8 C)-99.2 F (37.3 C)] 98.7 F (37.1 C) (10/20 0400) Pulse Rate:  [61-89] 74 (10/20 0700) Cardiac Rhythm:  [-] Normal sinus rhythm (10/20 0700) Resp:  [22-37] 22 (10/20 0700) BP: (95-142)/(36-75) 115/44 mmHg (10/20 0700) SpO2:  [94 %-100 %] 100 % (10/20 0700) Weight:  [156 lb 8.4 oz (71 kg)] 156 lb 8.4 oz (71 kg) (10/20 0600)  Filed Weights   06/01/13 0500 06/02/13 0600 06/03/13 0600  Weight: 156 lb 12 oz (71.1 kg) 154 lb 15.7 oz (70.3 kg) 156 lb 8.4 oz (71 kg)  PRE OP WEIGHT: 67 kg   Weight change: 1 lb 8.7 oz (0.7 kg)   Hemodynamic parameters for last 24 hours:    Intake/Output from previous day: 10/19 0701 - 10/20 0700 In: 1063 [P.O.:370; I.V.:643; IV Piggyback:50] Out: 1660 [Urine:1660]  Intake/Output this shift:    Current Meds: Scheduled Meds: . acetaminophen  1,000 mg Oral Q6H   Or  . acetaminophen (TYLENOL) oral liquid 160 mg/5 mL  1,000 mg Per Tube Q6H  . acetaminophen (TYLENOL) oral liquid 160 mg/5 mL  650 mg Per Tube Once   Or  . acetaminophen  650 mg Rectal Once  . aspirin EC  325 mg Oral Daily   Or  . aspirin  324 mg Per Tube Daily  . bisacodyl  10 mg Oral Daily   Or  . bisacodyl  10 mg Rectal Daily  . brimonidine  1 drop Both Eyes Q8H  . docusate sodium  200 mg Oral Daily  . dorzolamide  1 drop Both Eyes TID  . insulin aspart  0-24 Units  Subcutaneous Q4H  . insulin detemir  15 Units Subcutaneous Daily  . latanoprost  1 drop Both Eyes QHS  . magnesium sulfate  4 g Intravenous Once  . metoprolol tartrate  12.5 mg Oral BID   Or  . metoprolol tartrate  12.5 mg Per Tube BID  . pantoprazole  40 mg Oral Daily  . sodium chloride  3 mL Intravenous Q12H   Continuous Infusions: . sodium chloride    . sodium chloride 20 mL/hr at 06/01/13 0400  . sodium chloride    . sodium chloride 20 mL (06/02/13 1700)  . sodium chloride 10 mL/hr at 05/31/13 1900  . insulin (NOVOLIN-R) infusion Stopped (06/01/13 1600)  . lactated ringers    . lactated ringers Stopped (06/02/13 1700)  . nitroGLYCERIN Stopped (05/31/13 2200)   PRN Meds:.metoprolol, morphine injection, ondansetron (ZOFRAN) IV, oxyCODONE, potassium chloride, sodium chloride, traMADol   Physical Exam: General appearance: alert, cooperative and no distress Heart: regular rate and rhythm Lungs: diminished breath sounds bibasilar Extremities: Mild LE edema Wound: Dressed and dry   Lab Results: CBC: Recent Labs  06/02/13 0354 06/03/13  0352  WBC 17.9* 16.4*  HGB 8.3* 8.1*  HCT 24.5* 24.3*  PLT 100* 105*   BMET:  Recent Labs  06/02/13 0354 06/03/13 0352  NA 139 136  K 3.9 3.9  CL 106 104  CO2 22 23  GLUCOSE 132* 79  BUN 24* 33*  CREATININE 1.07 1.17*  CALCIUM 8.3* 8.7    PT/INR:  Recent Labs  05/31/13 1650  LABPROT 17.7*  INR 1.50*   Radiology: Dg Chest Port 1 View  06/02/2013   CLINICAL DATA:  Postop open heart surgery.  EXAM: PORTABLE CHEST - 1 VIEW  COMPARISON:  06/01/2013.  FINDINGS: The Swan-Ganz catheter has been removed. The right IJ central venous catheter and right IJ Cordis are still in place. The mediastinal drain tubes been removed. Slightly lower lung volumes with increased and bibasilar atelectasis. There is a small persistent left pleural effusion. The left-sided chest tube has been removed. No pneumothorax.  IMPRESSION: Removal of support  apparatus as discussed above. No pneumothorax is seen.  Lower lung volumes with slight increase in bibasilar atelectasis.  Persistent small left effusion.   Electronically Signed   By: Kalman Jewels M.D.   On: 06/02/2013 07:55     Assessment/Plan: S/P Procedure(s) (LRB): Coronary Artery Bypass Grafting Times Three Using Left Internal Mammary Artery and Right Saphenous Leg Vein Harvested Endoscopically (N/A) MITRAL VALVE REPAIR (MVR) (N/A) INTRAOPERATIVE TRANSESOPHAGEAL ECHOCARDIOGRAM (N/A)  CV- SR, BPs stable. Continue current meds.  Vol overload- continue IV Lasix.  DM- sugars stable, continue Levemir.   Expected postop blood loss anemia- H/H generally stable.  Watch.  Deconditioning- PT ordered,but have not seen pt yet.  Continue to mobilize as tolerated.  Will have speech therapy see pt to r/o possible dysphagia/silent aspiration.  COLLINS,GINA H 06/03/2013 7:43 AM  ADDENDUM: Pt just had a brief run of AF on telemetry, now back in SR.  SBPs have been borderline, running 90-110s, and her beta blocker doses have been held most of the weekend for hypotension.  Will start po Amio and watch.  Fluid retention, L effusion with lowBP and anemia- BUN>30---give 1 unit PRBC and start low dose dopamine Needs PT- postop alelectasis LLL Donot plan coumadin due to adv age

## 2013-06-03 NOTE — Progress Notes (Signed)
PT Cancellation Note  Patient Details Name: DANUTA ESFAHANI MRN: [id number] DOB: [birth year]   Cancelled Treatment:    Reason Eval/Treat Not Completed: Patient not medically ready. RN deferred at this time due to just returning pt back to bed due to pt going in/out of A-fib and tachy heart rate into 140s. PT to return as able.   Kingsley Callander 06/03/2013, 8:49 AM

## 2013-06-03 NOTE — Evaluation (Signed)
Physical Therapy Evaluation Patient Details Name: Gabriela [name] MRN: [id number] DOB: [birth year] Today's Date: 06/03/2013 Time: [id number] PT Time Calculation (min): 29 min  PT Assessment / Plan / Recommendation History of Present Illness  Pt s/p cabgx3  Clinical Impression  Pt tolerated ambulation well this date however remains to require assist for all transfers and has poor recall/comprehension of sternal precautions. Pt to strongly benefit from ST-SNF Placement to achieve safe mod I function for safe transition home.    PT Assessment  Patient needs continued PT services    Follow Up Recommendations  SNF;Supervision/Assistance - 24 hour    Does the patient have the potential to tolerate intense rehabilitation      Barriers to Discharge Decreased caregiver support      Equipment Recommendations  Rolling walker with 5" wheels    Recommendations for Other Services     Frequency Min 3X/week    Precautions / Restrictions Precautions Precautions: Fall;Sternal Precaution Comments: pt with poor recall Restrictions Weight Bearing Restrictions: No   Pertinent Vitals/Pain Report minimal pain at surgical site, mild pain at R jaw      Mobility  Bed Mobility Bed Mobility: Supine to Sit Supine to Sit: 2: Max assist;HOB elevated Details for Bed Mobility Assistance: max directional verbal ques, maxA for trunk elevation and to bring hips to EOB due to sternal precautions Transfers Transfers: Sit to Stand;Stand to Sit Sit to Stand: 3: Mod assist;With upper extremity assist;From bed Stand to Sit: 4: Min assist;With upper extremity assist;To chair/3-in-1 Details for Transfer Assistance: used rocking/momentum technique to adhere to sternal precautions. assist for anterior weight shift to push up through LEs Ambulation/Gait Ambulation/Gait Assistance: 4: Min assist Ambulation Distance (Feet): 75 Feet Assistive device: Rolling walker Ambulation/Gait Assistance Details: verbal cues  for walker management, pt requested to sit due to fatigue Gait Pattern: Step-through pattern;Decreased stride length;Trunk flexed Gait velocity: slow Stairs: No    Exercises     PT Diagnosis: Difficulty walking;Generalized weakness  PT Problem List: Decreased strength;Decreased activity tolerance;Decreased balance;Decreased mobility;Decreased coordination;Decreased safety awareness;Decreased knowledge of precautions;Cardiopulmonary status limiting activity PT Treatment Interventions: DME instruction;Gait training;Stair training;Functional mobility training;Therapeutic activities;Therapeutic exercise     PT Goals(Current goals can be found in the care plan section) Acute Rehab PT Goals Patient Stated Goal: rehab PT Goal Formulation: With patient Time For Goal Achievement: 06/10/13 Potential to Achieve Goals: Good  Visit Information  Last PT Received On: 06/03/13 Assistance Needed: +1 (2nd person helpful for lines/chair follow) History of Present Illness: Pt s/p cabgx3       Prior Functioning  Home Living Family/patient expects to be discharged to:: Skilled nursing facility Living Arrangements: Alone Additional Comments: pt reports "i'm going to rehab" Prior Function Level of Independence: Independent Comments: pt reports granddaughter getting groceries Communication Communication: No difficulties Dominant Hand: Right    Cognition  Cognition Arousal/Alertness: Awake/alert Behavior During Therapy: WFL for tasks assessed/performed Overall Cognitive Status: Within Functional Limits for tasks assessed Memory: Decreased short-term memory;Decreased recall of precautions    Extremity/Trunk Assessment Upper Extremity Assessment Upper Extremity Assessment: Generalized weakness (limited by sternal precautions) Lower Extremity Assessment Lower Extremity Assessment: Generalized weakness Cervical / Trunk Assessment Cervical / Trunk Assessment: Normal   Balance Balance Balance  Assessed: Yes Static Sitting Balance Static Sitting - Balance Support: Feet supported;No upper extremity supported Static Sitting - Level of Assistance: 5: Stand by assistance Static Sitting - Comment/# of Minutes: 5 min  End of Session PT - End of Session Equipment Utilized During Treatment: Gait belt  Activity Tolerance: Patient limited by fatigue Patient left: in chair;with call bell/phone within reach;with nursing/sitter in room Nurse Communication: Mobility status  GP     Kingsley Callander 06/03/2013, 3:33 PM Kittie Plater, PT, DPT Pager #: [phone] Office #: [phone]

## 2013-06-04 ENCOUNTER — Encounter (HOSPITAL_COMMUNITY): Payer: Self-pay | Admitting: Cardiothoracic Surgery

## 2013-06-04 ENCOUNTER — Inpatient Hospital Stay (HOSPITAL_COMMUNITY): Payer: Medicare Other

## 2013-06-04 LAB — COMPREHENSIVE METABOLIC PANEL
ALT: 21 U/L (ref 0–35)
AST: 33 U/L (ref 0–37)
Albumin: 2.8 g/dL — ABNORMAL LOW (ref 3.5–5.2)
Alkaline Phosphatase: 84 U/L (ref 39–117)
BUN: 38 mg/dL — ABNORMAL HIGH (ref 6–23)
CO2: 24 mEq/L (ref 19–32)
Calcium: 8.6 mg/dL (ref 8.4–10.5)
Chloride: 103 mEq/L (ref 96–112)
Creatinine, Ser: 1.08 mg/dL (ref 0.50–1.10)
GFR calc Af Amer: 53 mL/min — ABNORMAL LOW (ref >90)
GFR calc non Af Amer: 45 mL/min — ABNORMAL LOW (ref >90)
Glucose, Bld: 99 mg/dL (ref 70–99)
Potassium: 3.8 mEq/L (ref 3.5–5.1)
Sodium: 136 mEq/L (ref 135–145)
Total Bilirubin: 0.5 mg/dL (ref 0.3–1.2)
Total Protein: 6.5 g/dL (ref 6.0–8.3)

## 2013-06-04 LAB — TYPE AND SCREEN
ABO/RH(D): O POS
Antibody Screen: NEGATIVE
Unit division: 0
Unit division: 0
Unit division: 0
Unit division: 0
Unit division: 0
Unit division: 0

## 2013-06-04 LAB — CBC
HCT: 27.8 % — ABNORMAL LOW (ref 36.0–46.0)
Hemoglobin: 9 g/dL — ABNORMAL LOW (ref 12.0–15.0)
MCH: 27.1 pg (ref 26.0–34.0)
MCHC: 32.4 g/dL (ref 30.0–36.0)
MCV: 83.7 fL (ref 78.0–100.0)
Platelets: 130 10*3/uL — ABNORMAL LOW (ref 150–400)
RBC: 3.32 MIL/uL — ABNORMAL LOW (ref 3.87–5.11)
RDW: 17 % — ABNORMAL HIGH (ref 11.5–15.5)
WBC: 13.7 10*3/uL — ABNORMAL HIGH (ref 4.0–10.5)

## 2013-06-04 LAB — GLUCOSE, CAPILLARY
Glucose-Capillary: 124 mg/dL — ABNORMAL HIGH (ref 70–99)
Glucose-Capillary: 101 mg/dL — ABNORMAL HIGH (ref 70–99)
Glucose-Capillary: 97 mg/dL (ref 70–99)
Glucose-Capillary: 136 mg/dL — ABNORMAL HIGH (ref 70–99)
Glucose-Capillary: 123 mg/dL — ABNORMAL HIGH (ref 70–99)
Glucose-Capillary: 220 mg/dL — ABNORMAL HIGH (ref 70–99)

## 2013-06-04 MED ORDER — SODIUM CHLORIDE 0.9 % IJ SOLN
10.0000 mL | Freq: Two times a day (BID) | INTRAMUSCULAR | Status: DC
  Administered 2013-06-04: 40 mL
  Administered 2013-06-04: 10 mL

## 2013-06-04 MED ORDER — FUROSEMIDE 10 MG/ML IJ SOLN
80.0000 mg | Freq: Two times a day (BID) | INTRAMUSCULAR | Status: DC
  Administered 2013-06-04 – 2013-06-05 (×3): 80 mg via INTRAVENOUS
  Filled 2013-06-04 (×3): qty 8

## 2013-06-04 MED ORDER — SODIUM CHLORIDE 0.9 % IJ SOLN
10.0000 mL | INTRAMUSCULAR | Status: DC | PRN
  Administered 2013-06-05 (×2): 10 mL
  Administered 2013-06-06: 20 mL
  Administered 2013-06-06 (×2): 10 mL
  Administered 2013-06-07 (×2): 20 mL

## 2013-06-04 MED ORDER — POTASSIUM CHLORIDE CRYS ER 10 MEQ PO TBCR
10.0000 meq | EXTENDED_RELEASE_TABLET | Freq: Once | ORAL | Status: AC
  Administered 2013-06-04: 10 meq via ORAL
  Filled 2013-06-04: qty 1

## 2013-06-04 MED ORDER — ENOXAPARIN SODIUM 30 MG/0.3ML ~~LOC~~ SOLN
30.0000 mg | SUBCUTANEOUS | Status: DC
  Administered 2013-06-04 – 2013-06-07 (×4): 30 mg via SUBCUTANEOUS
  Filled 2013-06-04 (×6): qty 0.3

## 2013-06-04 MED FILL — Sodium Chloride IV Soln 0.9%: INTRAVENOUS | Qty: 1000 | Status: AC

## 2013-06-04 MED FILL — Heparin Sodium (Porcine) Inj 1000 Unit/ML: INTRAMUSCULAR | Qty: 30 | Status: AC

## 2013-06-04 MED FILL — Potassium Chloride Inj 2 mEq/ML: INTRAVENOUS | Qty: 40 | Status: AC

## 2013-06-04 MED FILL — Magnesium Sulfate Inj 50%: INTRAMUSCULAR | Qty: 10 | Status: AC

## 2013-06-04 NOTE — Progress Notes (Signed)
Physical Therapy Treatment Patient Details Name: Gabriela [name] MRN: [id number] DOB: [birth year] Today's Date: 06/04/2013 Time: [id number] PT Time Calculation (min): 25 min  PT Assessment / Plan / Recommendation  History of Present Illness Pt s/p cabgx3   PT Comments   Pt with increased ambulation tolerance this date but con't to require increased assist for transfers to adhere to sternal precautions. Pt remains appropriate for ST-SNF upon d/c to maximize function within sternal prec. And to achieve safe mod I function for safe transition home.   Follow Up Recommendations  SNF;Supervision/Assistance - 24 hour     Does the patient have the potential to tolerate intense rehabilitation     Barriers to Discharge        Equipment Recommendations  Rolling walker with 5" wheels    Recommendations for Other Services    Frequency Min 3X/week   Progress towards PT Goals Progress towards PT goals: Progressing toward goals  Plan Current plan remains appropriate    Precautions / Restrictions Precautions Precautions: Fall;Sternal Precaution Comments: pt unable to recall sternal precautions Restrictions Weight Bearing Restrictions: No   Pertinent Vitals/Pain Reports minimal pain    Mobility  Bed Mobility Bed Mobility: Not assessed Transfers Transfers: Sit to Stand;Stand to Sit Sit to Stand: 3: Mod assist;With upper extremity assist;From bed Stand to Sit: 4: Min assist;With upper extremity assist;To chair/3-in-1 Details for Transfer Assistance: used rocking/momentum technique to adhere to sternal precautions. assist for anterior weight shift to push up through LEs Ambulation/Gait Ambulation/Gait Assistance: 4: Min assist Ambulation Distance (Feet): 100 Feet (x2) Assistive device: Rolling walker Ambulation/Gait Assistance Details: directional v/c's, increased upright posture Gait Pattern: Step-through pattern;Decreased stride length;Trunk flexed Gait velocity: slow General Gait  Details: pt took 5 min rest break between 100' bouts    Exercises     PT Diagnosis:    PT Problem List:   PT Treatment Interventions:     PT Goals (current goals can now be found in the care plan section)    Visit Information  Last PT Received On: 06/04/13 Assistance Needed: +1 History of Present Illness: Pt s/p cabgx3    Subjective Data  Subjective: Pt received up in chair agreeable to PT   Cognition  Cognition Arousal/Alertness: Awake/alert Behavior During Therapy: WFL for tasks assessed/performed Overall Cognitive Status: Within Functional Limits for tasks assessed Memory: Decreased short-term memory;Decreased recall of precautions    Balance     End of Session PT - End of Session Equipment Utilized During Treatment: Gait belt Activity Tolerance: Patient tolerated treatment well Patient left: in chair;with call bell/phone within reach Nurse Communication: Mobility status   GP     Kingsley Callander 06/04/2013, 8:58 AM  Kittie Plater, PT, DPT Pager #: [phone] Office #: [phone]

## 2013-06-04 NOTE — Progress Notes (Signed)
4 Days Post-Op Procedure(s) (LRB): Coronary Artery Bypass Grafting Times Three Using Left Internal Mammary Artery and Right Saphenous Leg Vein Harvested Endoscopically (N/A) MITRAL VALVE REPAIR (MVR) (N/A) INTRAOPERATIVE TRANSESOPHAGEAL ECHOCARDIOGRAM (N/A) Subjective: Weight up, low urine output CO-OX 65% Sinus rhythm this am Mild L pleural effusion Neuro intact  Objective: Vital signs in last 24 hours: Temp:  [98 F (36.7 C)-98.6 F (37 C)] 98.4 F (36.9 C) (10/21 0400) Pulse Rate:  [53-126] 67 (10/21 0700) Cardiac Rhythm:  [-] Atrial fibrillation;Normal sinus rhythm (10/21 0000) Resp:  [14-36] 22 (10/21 0700) BP: (94-159)/(33-112) 122/62 mmHg (10/21 0700) SpO2:  [93 %-100 %] 95 % (10/21 0700) Weight:  [158 lb 4.6 oz (71.8 kg)] 158 lb 4.6 oz (71.8 kg) (10/21 0500)  Hemodynamic parameters for last 24 hours: CVP:  [11 mmHg-17 mmHg] 16 mmHg  Intake/Output from previous day: 10/20 0701 - 10/21 0700 In: 1187.8 [P.O.:270; I.V.:580.3; Blood:337.5] Out: 1017 [Urine:1017] Intake/Output this shift:    EXAM Alert and responsive extrem warm Dec BS L base  Lab Results:  Recent Labs  06/03/13 1500 06/04/13 0430  WBC 16.5* 13.7*  HGB 9.5* 9.0*  HCT 28.2* 27.8*  PLT 117* 130*   BMET:  Recent Labs  06/03/13 0352 06/04/13 0430  NA 136 136  K 3.9 3.8  CL 104 103  CO2 23 24  GLUCOSE 79 99  BUN 33* 38*  CREATININE 1.17* 1.08  CALCIUM 8.7 8.6    PT/INR: No results found for this basename: LABPROT, INR,  in the last 72 hours ABG    Component Value Date/Time   PHART 7.443 06/01/2013 0533   HCO3 22.0 06/01/2013 0533   TCO2 19 06/01/2013 1708   ACIDBASEDEF 2.0 06/01/2013 0533   O2SAT 68.0 06/03/2013 0945   CBG (last 3)   Recent Labs  06/03/13 2006 06/04/13 0103 06/04/13 0429  GLUCAP 140* 124* 101*    Assessment/Plan: S/P Procedure(s) (LRB): Coronary Artery Bypass Grafting Times Three Using Left Internal Mammary Artery and Right Saphenous Leg Vein  Harvested Endoscopically (N/A) MITRAL VALVE REPAIR (MVR) (N/A) INTRAOPERATIVE TRANSESOPHAGEAL ECHOCARDIOGRAM (N/A)   Increase lsix Ambulate BID Cont renal dopamine   LOS: 4 days    VAN TRIGT III,PETER 06/04/2013

## 2013-06-04 NOTE — Progress Notes (Signed)
Clinical Social Work Department BRIEF PSYCHOSOCIAL ASSESSMENT 06/04/2013  Patient:  Gabriela [name], Gabriela [name]     Account Number:  [id number]     [place] date:  05/31/2013  Clinical Social Worker:  Megan Salon  Date/Time:  06/04/2013 01:20 PM  Referred by:  Care Management  Date Referred:  06/04/2013 Referred for  SNF Placement   Other Referral:   Interview type:  Patient Other interview type:    PSYCHOSOCIAL DATA Living Status:  ALONE Admitted from facility:   Level of care:   Primary support name:  Eldred Manges [id number] Primary support relationship to patient:  CHILD, ADULT Degree of support available:   Good    CURRENT CONCERNS Current Concerns  Post-Acute Placement   Other Concerns:    SOCIAL WORK ASSESSMENT / PLAN Clinical Social Worker received referral for SNF placement at d/c. CSW introduced self and explained reason for visit. CSW explained SNF process to patient. Patient reported she is agreeable for SNF placement in the [facility] area. CSW will complete FL2 for MD's signature and will update patient and family when bed offers are received.   Assessment/plan status:  Psychosocial Support/Ongoing Assessment of Needs Other assessment/ plan:   Information/referral to community resources:   SNF information    PATIENT'S/FAMILY'S RESPONSE TO PLAN OF CARE: Patient is agreeable to SNF placement       Jeanette Caprice, MSW, [place]

## 2013-06-04 NOTE — Progress Notes (Signed)
Peripherally Inserted Central Catheter/Midline Placement  The IV Nurse has discussed with the patient and/or persons authorized to consent for the patient, the purpose of this procedure and the potential benefits and risks involved with this procedure.  The benefits include less needle sticks, lab draws from the catheter and patient may be discharged home with the catheter.  Risks include, but not limited to, infection, bleeding, blood clot (thrombus formation), and puncture of an artery; nerve damage and irregular heat beat.  Alternatives to this procedure were also discussed, by Claretha Cooper, RN.  PICC/Midline Placement Documentation  PICC / Midline Double Lumen 06/04/13 PICC Right Brachial 41 cm 2 cm (Active)  Exposed Catheter (cm) 2 cm 06/04/2013 11:46 AM  Dressing Change Due 06/11/13 06/04/2013 11:46 AM       Gabriela [name], Nicolette Bang 06/04/2013, 11:47 AM

## 2013-06-04 NOTE — Progress Notes (Signed)
Clinical Social Work Department CLINICAL SOCIAL WORK PLACEMENT NOTE 06/04/2013  Patient:  Gabriela [name], Gabriela [name]  Account Number:  [id number] [place] date:  05/31/2013  Clinical Social Worker:  Megan Salon  Date/time:  06/04/2013 01:21 PM  Clinical Social Work is seeking post-discharge placement for this patient at the following level of care:   [place]   (*CSW will update this form in Epic as items are completed)   06/04/2013  Patient/family provided with [place] Department of Clinical Social Work's list of facilities offering this level of care within the geographic area requested by the patient (or if unable, by the patient's family).  06/04/2013  Patient/family informed of their freedom to choose among providers that offer the needed level of care, that participate in Medicare, Medicaid or managed care program needed by the patient, have an available bed and are willing to accept the patient.  06/04/2013  Patient/family informed of [facility]' ownership interest in [facility], as well as of the fact that they are under no obligation to receive care at this facility.  PASARR submitted to EDS on 06/04/2013 PASARR number received from EDS on 06/04/2013  FL2 transmitted to all facilities in geographic area requested by pt/family on  06/04/2013 FL2 transmitted to all facilities within larger geographic area on   Patient informed that his/her managed care company has contracts with or will negotiate with  certain facilities, including the following:     Patient/family informed of bed offers received:   Patient chooses bed at  Physician recommends and patient chooses bed at    Patient to be transferred to  on   Patient to be transferred to facility by   The following physician request were entered in Epic:   Additional Comments:  Jeanette Caprice, MSW, [place]

## 2013-06-04 NOTE — Progress Notes (Signed)
POD # 4 CABG mitral repair  Up in chair, no complaints currently  BP 113/46  Pulse 73  Temp(Src) 97.6 F (36.4 C) (Oral)  Resp 19  Ht 5\' 3"  (1.6 m)  Wt 158 lb 4.6 oz (71.8 kg)  BMI 28.05 kg/m2  SpO2 96%   Intake/Output Summary (Last 24 hours) at 06/04/13 1833 Last data filed at 06/04/13 1800  Gross per 24 hour  Intake  792.9 ml  Output   1780 ml  Net -987.1 ml   Tolerated decrease in dopamine  Continue present care

## 2013-06-05 ENCOUNTER — Encounter: Payer: Self-pay | Admitting: Cardiothoracic Surgery

## 2013-06-05 ENCOUNTER — Inpatient Hospital Stay (HOSPITAL_COMMUNITY): Payer: Medicare Other

## 2013-06-05 LAB — GLUCOSE, CAPILLARY
Glucose-Capillary: 115 mg/dL — ABNORMAL HIGH (ref 70–99)
Glucose-Capillary: 154 mg/dL — ABNORMAL HIGH (ref 70–99)
Glucose-Capillary: 165 mg/dL — ABNORMAL HIGH (ref 70–99)
Glucose-Capillary: 180 mg/dL — ABNORMAL HIGH (ref 70–99)
Glucose-Capillary: 40 mg/dL — CL (ref 70–99)
Glucose-Capillary: 94 mg/dL (ref 70–99)

## 2013-06-05 LAB — COMPREHENSIVE METABOLIC PANEL
ALT: 18 U/L (ref 0–35)
AST: 23 U/L (ref 0–37)
Albumin: 2.7 g/dL — ABNORMAL LOW (ref 3.5–5.2)
Alkaline Phosphatase: 90 U/L (ref 39–117)
BUN: 37 mg/dL — ABNORMAL HIGH (ref 6–23)
CO2: 27 mEq/L (ref 19–32)
Calcium: 8.6 mg/dL (ref 8.4–10.5)
Chloride: 104 mEq/L (ref 96–112)
Creatinine, Ser: 1.05 mg/dL (ref 0.50–1.10)
GFR calc Af Amer: 55 mL/min — ABNORMAL LOW (ref >90)
GFR calc non Af Amer: 47 mL/min — ABNORMAL LOW (ref >90)
Glucose, Bld: 109 mg/dL — ABNORMAL HIGH (ref 70–99)
Potassium: 3.8 mEq/L (ref 3.5–5.1)
Sodium: 140 mEq/L (ref 135–145)
Total Bilirubin: 0.5 mg/dL (ref 0.3–1.2)
Total Protein: 6.6 g/dL (ref 6.0–8.3)

## 2013-06-05 LAB — CBC
HCT: 28.4 % — ABNORMAL LOW (ref 36.0–46.0)
Hemoglobin: 9.2 g/dL — ABNORMAL LOW (ref 12.0–15.0)
MCH: 27.1 pg (ref 26.0–34.0)
MCHC: 32.4 g/dL (ref 30.0–36.0)
MCV: 83.8 fL (ref 78.0–100.0)
Platelets: 159 10*3/uL (ref 150–400)
RBC: 3.39 MIL/uL — ABNORMAL LOW (ref 3.87–5.11)
RDW: 16.9 % — ABNORMAL HIGH (ref 11.5–15.5)
WBC: 12.7 10*3/uL — ABNORMAL HIGH (ref 4.0–10.5)

## 2013-06-05 MED ORDER — MAGNESIUM HYDROXIDE 400 MG/5ML PO SUSP: 30.0000 mL | Freq: Every day | ORAL | Status: DC | PRN

## 2013-06-05 MED ORDER — BISACODYL 10 MG RE SUPP: 10.0000 mg | Freq: Every day | RECTAL | Status: DC | PRN

## 2013-06-05 MED ORDER — FUROSEMIDE 80 MG PO TABS
80.0000 mg | ORAL_TABLET | Freq: Every day | ORAL | Status: DC
  Administered 2013-06-06: 80 mg via ORAL
  Filled 2013-06-05: qty 1

## 2013-06-05 MED ORDER — INSULIN ASPART 100 UNIT/ML ~~LOC~~ SOLN
0.0000 [IU] | Freq: Three times a day (TID) | SUBCUTANEOUS | Status: DC
  Administered 2013-06-05: 17:00:00 via SUBCUTANEOUS
  Administered 2013-06-05: 4 [IU] via SUBCUTANEOUS
  Administered 2013-06-06: 2 [IU] via SUBCUTANEOUS
  Administered 2013-06-06: 8 [IU] via SUBCUTANEOUS
  Administered 2013-06-07: 4 [IU] via SUBCUTANEOUS
  Administered 2013-06-07: 2 [IU] via SUBCUTANEOUS
  Administered 2013-06-08: 4 [IU] via SUBCUTANEOUS

## 2013-06-05 MED ORDER — MOVING RIGHT ALONG BOOK
Freq: Once | Status: AC
  Administered 2013-06-05: 14:00:00
  Filled 2013-06-05: qty 1

## 2013-06-05 MED ORDER — INSULIN DETEMIR 100 UNIT/ML ~~LOC~~ SOLN
15.0000 [IU] | Freq: Every day | SUBCUTANEOUS | Status: DC
  Administered 2013-06-05 – 2013-06-07 (×3): 15 [IU] via SUBCUTANEOUS
  Filled 2013-06-05 (×4): qty 0.15

## 2013-06-05 MED ORDER — SODIUM CHLORIDE 0.9 % IJ SOLN: 3.0000 mL | INTRAMUSCULAR | Status: DC | PRN

## 2013-06-05 MED ORDER — POTASSIUM CHLORIDE 10 MEQ/50ML IV SOLN
10.0000 meq | INTRAVENOUS | Status: AC
  Administered 2013-06-05 (×2): 10 meq via INTRAVENOUS

## 2013-06-05 MED ORDER — SODIUM CHLORIDE 0.9 % IV SOLN: 250.0000 mL | INTRAVENOUS | Status: DC | PRN

## 2013-06-05 MED ORDER — BISACODYL 5 MG PO TBEC: 10.0000 mg | DELAYED_RELEASE_TABLET | Freq: Every day | ORAL | Status: DC | PRN

## 2013-06-05 MED ORDER — DEXTROSE 50 % IV SOLN
25.0000 mL | Freq: Once | INTRAVENOUS | Status: AC
  Administered 2013-06-05: 25 mL via INTRAVENOUS

## 2013-06-05 MED ORDER — SODIUM CHLORIDE 0.9 % IJ SOLN: 3.0000 mL | Freq: Two times a day (BID) | INTRAMUSCULAR | Status: DC

## 2013-06-05 MED ORDER — HYDRALAZINE HCL 10 MG PO TABS
10.0000 mg | ORAL_TABLET | Freq: Three times a day (TID) | ORAL | Status: DC
  Administered 2013-06-05 – 2013-06-06 (×2): 10 mg via ORAL
  Filled 2013-06-05 (×5): qty 1

## 2013-06-05 MED ORDER — DEXTROSE 50 % IV SOLN: 25.0000 mL | Freq: Once | INTRAVENOUS | Status: AC | PRN

## 2013-06-05 MED ORDER — ATORVASTATIN CALCIUM 20 MG PO TABS
20.0000 mg | ORAL_TABLET | Freq: Every day | ORAL | Status: DC
  Administered 2013-06-05 – 2013-06-07 (×3): 20 mg via ORAL
  Filled 2013-06-05 (×4): qty 1

## 2013-06-05 MED ORDER — POTASSIUM CHLORIDE CRYS ER 10 MEQ PO TBCR
10.0000 meq | EXTENDED_RELEASE_TABLET | Freq: Every day | ORAL | Status: DC
  Administered 2013-06-06 – 2013-06-08 (×3): 10 meq via ORAL
  Filled 2013-06-05 (×3): qty 1

## 2013-06-05 NOTE — Progress Notes (Addendum)
Inpatient Diabetes Program Recommendations  AACE/ADA: New Consensus Statement on Inpatient Glycemic Control (2013)  Target Ranges:  Prepandial:   less than 140 mg/dL      Peak postprandial:   less than 180 mg/dL (1-2 hours)      Critically ill patients:  140 - 180 mg/dL   Reason for Visit: Results for ISSAMAR, HUCKABAY (MRN [id number]) as of 06/05/2013 09:44  Ref. Range 06/04/2013 20:12 06/04/2013 23:34 06/05/2013 04:21 06/05/2013 04:32 06/05/2013 08:25  Glucose-Capillary Latest Range: 70-99 mg/dL 220 (H) 165 (H) 40 (LL) 115 (H) 94   Note that Novolog correction discontinued. Consider restarting Novolog sensitive tid wc and HS.  Please consider decreasing Levemir to 10 units q HS.    Thanks, Adah Perl, RN, BC-ADM Inpatient Diabetes Coordinator Pager [phone]

## 2013-06-05 NOTE — Progress Notes (Signed)
Per report, pt ambulated once on [address] with PT, and then roughly 300 feet for a second walk on her way to [address]. Pt tolerated well with rolling walker and assist x1.

## 2013-06-05 NOTE — Progress Notes (Signed)
Speech Language Pathology Treatment:    Patient Details Name: Gabriela [name] MRN: [id number] DOB: [birth year] Today's Date: 06/05/2013 Time: [id number] SLP Time Calculation (min): 23 min  Assessment / Plan / Recommendation Clinical Impression  Transient dysphagia s/p intubation, now resolved.  Pt. Is able to tolerate sips of thin liquid via cup and straw without overt s/s of aspiration (no cough).  D/C thickened liquids.  May advance to Regular diet when family brings upper dentures and lower partial in.  D/C SLP.   HPI HPI: 79 yr old admitted for Coronary Artery Bypass Grafting Times Three, MITRAL VALVE REPAIR (MVR).  PMH:  Hypertension, Diabetes mellitus, Hyperlipidemia, arthritis, Glaucoma.  Intubated 10/17-10/18.  RN states pt. coughing with liquids last night.  Pt. denied swallow difficulty prior to surgery and mildly increased coughing with liquids since CABG per pt.    Pertinent Vitals Pt. Remains afebrile; lung sounds are diminished, but clear.  SLP Plan  Discharge SLP treatment due to (comment);All goals met    Recommendations Liquids provided via: Cup;Straw Medication Administration: Whole meds with puree Supervision: Patient able to self feed;Intermittent supervision to cue for compensatory strategies Compensations: Slow rate;Small sips/bites Postural Changes and/or Swallow Maneuvers: Seated upright 90 degrees              Oral Care Recommendations: Oral care BID Plan: Discharge SLP treatment due to (comment);All goals met    GO     Quinn Axe T 06/05/2013, 2:19 PM

## 2013-06-05 NOTE — Progress Notes (Signed)
Received pt from 2300. Pt is stable and resting in chair.

## 2013-06-05 NOTE — Progress Notes (Signed)
Physical Therapy Treatment Patient Details Name: Gabriela [name] MRN: [id number] DOB: 03/13/1926 Today's Date: 06/05/2013 Time: [id number] PT Time Calculation (min): 26 min  PT Assessment / Plan / Recommendation  History of Present Illness Pt s/p cabgx3   PT Comments   Pt continues to make steady progress. Pulse oximeter was not functioning most of her walk, however she had minimal dyspnea and after seated rest x 30 seconds, SaO2 measured 97% (all on RA). Pt reporting she needed to sit and rest after 90 feet, however was able to complete walk with encouragement.   Follow Up Recommendations  SNF;Supervision/Assistance - 24 hour     Does the patient have the potential to tolerate intense rehabilitation     Barriers to Discharge        Equipment Recommendations  None recommended by PT (pt reports she owns a RW from prior hip fx)    Recommendations for Other Services    Frequency Min 3X/week   Progress towards PT Goals Progress towards PT goals: Progressing toward goals  Plan Current plan remains appropriate    Precautions / Restrictions Precautions Precautions: Fall;Sternal Restrictions Weight Bearing Restrictions: No   Pertinent Vitals/Pain See above re: SaO2 HR 80-86 during session    Mobility  Bed Mobility Bed Mobility: Not assessed Transfers Transfers: Sit to Stand;Stand to Sit Sit to Stand: 3: Mod assist;4: Min assist;Without upper extremity assist Stand to Sit: 4: Min assist;4: Min guard Details for Transfer Assistance: used momentum and vc for technique; repeated x6 with pt requiring less assist as repeated Ambulation/Gait Ambulation/Gait Assistance: 4: Min guard Ambulation Distance (Feet): 180 Feet Assistive device: Rolling walker Ambulation/Gait Assistance Details: vc for upright posture and longer stride; pt required encouragement to push a little farther Gait Pattern: Step-through pattern;Decreased stride length;Trunk flexed Gait velocity: slow     Exercises     PT Diagnosis:    PT Problem List:   PT Treatment Interventions:     PT Goals (current goals can now be found in the care plan section)    Visit Information  Last PT Received On: 06/05/13 Assistance Needed: +1 History of Present Illness: Pt s/p cabgx3    Subjective Data      Cognition  Cognition Arousal/Alertness: Awake/alert Behavior During Therapy: WFL for tasks assessed/performed Overall Cognitive Status: Within Functional Limits for tasks assessed    Balance     End of Session PT - End of Session Equipment Utilized During Treatment: Gait belt Activity Tolerance: Patient tolerated treatment well Patient left: in chair;with call bell/phone within reach;with family/visitor present   GP     Iley Deignan 06/05/2013, 11:27 AM Pager [phone]

## 2013-06-05 NOTE — Progress Notes (Signed)
Called report to [facility] on [address].  All questions and concerns addressed.  Advised her that the foley had been d/c'd and first void had not yet occurred.  Granddaughter was present on transfer and accompanied the patient.  All VS were WNL on transfer.  Pt ambulated 75% of the distance to the room and tolerated well.

## 2013-06-05 NOTE — Progress Notes (Signed)
Hypoglycemic Event  CBG: 40  Treatment: D50 IV 25 mL  Symptoms: Pale and Sweaty  Follow-up CBG: [id number] CBG Result:115  Possible Reasons for Event: Inadequate meal intake  Comments/MD notified:    Malaak Stach, Armando Reichert  Remember to initiate Hypoglycemia Order Set & complete

## 2013-06-06 ENCOUNTER — Inpatient Hospital Stay (HOSPITAL_COMMUNITY): Payer: Medicare Other

## 2013-06-06 LAB — BASIC METABOLIC PANEL
BUN: 37 mg/dL — ABNORMAL HIGH (ref 6–23)
CO2: 25 mEq/L (ref 19–32)
Calcium: 8.5 mg/dL (ref 8.4–10.5)
Chloride: 102 mEq/L (ref 96–112)
Creatinine, Ser: 1.09 mg/dL (ref 0.50–1.10)
GFR calc Af Amer: 52 mL/min — ABNORMAL LOW (ref >90)
GFR calc non Af Amer: 45 mL/min — ABNORMAL LOW (ref >90)
Glucose, Bld: 99 mg/dL (ref 70–99)
Potassium: 3.8 mEq/L (ref 3.5–5.1)
Sodium: 137 mEq/L (ref 135–145)

## 2013-06-06 LAB — CBC
HCT: 27.7 % — ABNORMAL LOW (ref 36.0–46.0)
Hemoglobin: 9.3 g/dL — ABNORMAL LOW (ref 12.0–15.0)
MCH: 28.2 pg (ref 26.0–34.0)
MCHC: 33.6 g/dL (ref 30.0–36.0)
MCV: 83.9 fL (ref 78.0–100.0)
Platelets: 176 10*3/uL (ref 150–400)
RBC: 3.3 MIL/uL — ABNORMAL LOW (ref 3.87–5.11)
RDW: 16.9 % — ABNORMAL HIGH (ref 11.5–15.5)
WBC: 13.8 10*3/uL — ABNORMAL HIGH (ref 4.0–10.5)

## 2013-06-06 LAB — GLUCOSE, CAPILLARY
Glucose-Capillary: 99 mg/dL (ref 70–99)
Glucose-Capillary: 130 mg/dL — ABNORMAL HIGH (ref 70–99)
Glucose-Capillary: 102 mg/dL — ABNORMAL HIGH (ref 70–99)
Glucose-Capillary: 212 mg/dL — ABNORMAL HIGH (ref 70–99)

## 2013-06-06 MED ORDER — LOPERAMIDE HCL 1 MG/5ML PO LIQD
1.0000 mg | ORAL | Status: DC | PRN
  Filled 2013-06-06: qty 5

## 2013-06-06 MED ORDER — FUROSEMIDE 40 MG PO TABS
40.0000 mg | ORAL_TABLET | Freq: Every day | ORAL | Status: DC
  Administered 2013-06-07 – 2013-06-08 (×2): 40 mg via ORAL
  Filled 2013-06-06 (×2): qty 1

## 2013-06-06 MED ORDER — AMIODARONE HCL 200 MG PO TABS
200.0000 mg | ORAL_TABLET | Freq: Every day | ORAL | Status: DC
Start: 2013-06-07 — End: 2013-06-08
  Administered 2013-06-07 – 2013-06-08 (×2): 200 mg via ORAL
  Filled 2013-06-06 (×2): qty 1

## 2013-06-06 MED ORDER — LISINOPRIL 2.5 MG PO TABS
2.5000 mg | ORAL_TABLET | Freq: Every day | ORAL | Status: DC
  Administered 2013-06-06 – 2013-06-08 (×3): 2.5 mg via ORAL
  Filled 2013-06-06 (×3): qty 1

## 2013-06-06 NOTE — Progress Notes (Addendum)
[address]       [place] [address]             [phone]      6 Days Post-Op  Procedure(s) (LRB): Coronary Artery Bypass Grafting Times Three Using Left Internal Mammary Artery and Right Saphenous Leg Vein Harvested Endoscopically (N/A) MITRAL VALVE REPAIR (MVR) (N/A) INTRAOPERATIVE TRANSESOPHAGEAL ECHOCARDIOGRAM (N/A) Subjective Feels well overall, some mild SOB  Objective  Telemetry sinus rhythm  Temp:  [97.3 F (36.3 C)-98.1 F (36.7 C)] 98 F (36.7 C) (10/23 0343) Pulse Rate:  [71-142] 71 (10/23 0343) Resp:  [18-31] 18 (10/23 0343) BP: (89-170)/(29-115) 117/41 mmHg (10/23 0343) SpO2:  [76 %-100 %] 95 % (10/23 0343) Weight:  [152 lb 8.9 oz (69.2 kg)] 152 lb 8.9 oz (69.2 kg) (10/23 0343)   Intake/Output Summary (Last 24 hours) at 06/06/13 0746 Last data filed at 06/05/13 1600  Gross per 24 hour  Intake  101.3 ml  Output    900 ml  Net -798.7 ml       General appearance: alert, cooperative and no distress Heart: regular rate and rhythm and no murmur Lungs: mildly dim in bases Abdomen: benign Extremities: no sig edema Wound: incisions healing well  Lab Results:  Recent Labs  06/05/13 0431 06/06/13 0540  NA 140 137  K 3.8 3.8  CL 104 102  CO2 27 25  GLUCOSE 109* 99  BUN 37* 37*  CREATININE 1.05 1.09  CALCIUM 8.6 8.5    Recent Labs  06/04/13 0430 06/05/13 0431  AST 33 23  ALT 21 18  ALKPHOS 84 90  BILITOT 0.5 0.5  PROT 6.5 6.6  ALBUMIN 2.8* 2.7*   No results found for this basename: LIPASE, AMYLASE,  in the last 72 hours  Recent Labs  06/05/13 0431 06/06/13 0540  WBC 12.7* 13.8*  HGB 9.2* 9.3*  HCT 28.4* 27.7*  MCV 83.8 83.9  PLT 159 176   No results found for this basename: CKTOTAL, CKMB, TROPONINI,  in the last 72 hours No components found with this basename: POCBNP,  No results found for this basename: DDIMER,  in the last 72 hours No results found for this basename: HGBA1C,  in the last 72  hours No results found for this basename: CHOL, HDL, LDLCALC, TRIG, CHOLHDL,  in the last 72 hours No results found for this basename: TSH, T4TOTAL, FREET3, T3FREE, THYROIDAB,  in the last 72 hours No results found for this basename: VITAMINB12, FOLATE, FERRITIN, TIBC, IRON, RETICCTPCT,  in the last 72 hours  Medications: Scheduled . acetaminophen (TYLENOL) oral liquid 160 mg/5 mL  650 mg Per Tube Once   Or  . acetaminophen  650 mg Rectal Once  . amiodarone  200 mg Oral BID  . aspirin EC  325 mg Oral Daily   Or  . aspirin  324 mg Per Tube Daily  . atorvastatin  20 mg Oral q1800  . bisacodyl  10 mg Oral Daily   Or  . bisacodyl  10 mg Rectal Daily  . brimonidine  1 drop Both Eyes Q8H  . docusate sodium  200 mg Oral Daily  . dorzolamide  1 drop Both Eyes TID  . enoxaparin (LOVENOX) injection  30 mg Subcutaneous Q24H  . ferrous [id number] C-folic acid  1 capsule Oral TID PC  . furosemide  80 mg Oral Daily  . hydrALAZINE  10 mg Oral Q8H  . insulin aspart  0-24 Units Subcutaneous TID AC &  HS  . insulin detemir  15 Units Subcutaneous QHS  . latanoprost  1 drop Both Eyes QHS  . metoprolol tartrate  12.5 mg Oral BID   Or  . metoprolol tartrate  12.5 mg Per Tube BID  . pantoprazole  40 mg Oral Daily  . potassium chloride  10 mEq Oral Daily  . sodium chloride  10-40 mL Intracatheter Q12H  . sodium chloride  3 mL Intravenous Q12H  . sodium chloride  3 mL Intravenous Q12H     Radiology/Studies:  Dg Chest 2 View  06/05/2013   CLINICAL DATA:  Status post mitral valve repair  EXAM: CHEST  2 VIEW  COMPARISON:  06/04/2013  FINDINGS: A right-sided PICC line is been placed with the catheter tip at the cavoatrial junction. The right-sided jugular central venous line is been removed in the interval. Postsurgical changes are again noted. A left-sided pleural effusion is again seen. No pneumothorax is noted. A small right-sided effusion remains as well.  IMPRESSION: Interval placement of a  right-sided PICC line.  No significant change from the prior exam.   Electronically Signed   By: Inez Catalina M.D.   On: 06/05/2013 08:01    INR: Will add last result for INR, ABG once components are confirmed Will add last 4 CBG results once components are confirmed  Assessment/Plan: S/P Procedure(s) (LRB): Coronary Artery Bypass Grafting Times Three Using Left Internal Mammary Artery and Right Saphenous Leg Vein Harvested Endoscopically (N/A) MITRAL VALVE REPAIR (MVR) (N/A) INTRAOPERATIVE TRANSESOPHAGEAL ECHOCARDIOGRAM (N/A)  1 doing well 2 labs stable 3 cont diuresis, may be able to decrease lasix soon, will start low dose ace inhib.-creat good, SBP upto 170 4 in sinus rhythm, on amio and betablocker 5 cont rehab- says her granddaughter will stay with her at d/c   LOS: 6 days    GOLD,WAYNE E 10/23/20147:46 AM  Pleural effusion improved- reduced lasix to 40 daily Add low dose ace-inhib Diarrhea- reduce amio to 200 daily and stop laxatives SNF at [facility] has been arranged by family and I spoke directly with the daughter Vickii Chafe to confirm.  DC EPWs tomorrow. SNF Sat

## 2013-06-06 NOTE — Progress Notes (Signed)
Clinical Social Worker provided patient and family (granddaughter by bedside and daughter over the phone) with skilled nursing facility bed offers. Patient appears open to SNF option and patient's daughter gave CSW permission to follow up with [facility]. CSW followed up with [facility] and confirmed a private room for possible dc tomorrow?  FL2 on chart for MD signature. CSW will update further tomorrow.  Jeanette Caprice, MSW, [place]

## 2013-06-06 NOTE — Progress Notes (Signed)
CARDIAC REHAB PHASE I   PRE:  Rate/Rhythm: 76SR  BP:  Supine:   Sitting: 108/42  Standing:    SaO2: 98%RA  MODE:  Ambulation: 150 ft   POST:  Rate/Rhythm: 87SR  BP:  Supine:   Sitting: 131/49  Standing:    SaO2: 98%RA 0820-0855 Pt walked 150 ft on RA with rolling walker,gait belt and asst x 1. Stopped twice to rest due to being tired. Pt did not feel she could walk farther. To recliner and then to [facility].  Call bell in reach.   Graylon Good, RN BSN  06/06/2013 8:52 AM

## 2013-06-06 NOTE — Progress Notes (Signed)
Pt ambulate 154ft in hall way . Pt tolerated well

## 2013-06-06 NOTE — Progress Notes (Addendum)
CSW received phone call from patient's daughter who states that she wants to take the patient home at discharge and start [place]. CSW referred this on to the Case Manager who will follow up with patient and daughter. CSW is singing off. Please re consult if CSW needs arise.  Jeanette Caprice, MSW, [place]

## 2013-06-06 NOTE — Progress Notes (Signed)
Pt ambulated 150 ft with complaints of SOB toward end.  Slight limp favoring left left leg due to bad right hip.  Pace started quick and slowed as walk progressed.  To recliner after walk with call be in reach and family at bedside.  Will con't plan of care.

## 2013-06-07 LAB — GLUCOSE, CAPILLARY
Glucose-Capillary: 76 mg/dL (ref 70–99)
Glucose-Capillary: 162 mg/dL — ABNORMAL HIGH (ref 70–99)
Glucose-Capillary: 86 mg/dL (ref 70–99)
Glucose-Capillary: 150 mg/dL — ABNORMAL HIGH (ref 70–99)

## 2013-06-07 NOTE — Progress Notes (Signed)
EPW's pulled per order and unit protocol.  All tips intact, Pt tolerated very well.  VSS, Sites painted, HOB at 30 degrees, CCMD notified.  Pt understands bedrest for one hour.  Call bell in reach, will monitor closely.

## 2013-06-07 NOTE — Progress Notes (Addendum)
      [address]       [place] [address]             [phone]      7 Days Post-Op Procedure(s) (LRB): Coronary Artery Bypass Grafting Times Three Using Left Internal Mammary Artery and Right Saphenous Leg Vein Harvested Endoscopically (N/A) MITRAL VALVE REPAIR (MVR) (N/A) INTRAOPERATIVE TRANSESOPHAGEAL ECHOCARDIOGRAM (N/A)  Subjective:  Gabriela [name] has no complaints this morning.  Her family has decided to take her home instead of placement in a SNF placement. + Ambulation + BM  Objective: Vital signs in last 24 hours: Temp:  [97.3 F (36.3 C)-98.4 F (36.9 C)] 98.4 F (36.9 C) (10/24 0431) Pulse Rate:  [71-80] 77 (10/24 0431) Cardiac Rhythm:  [-] Normal sinus rhythm (10/24 0851) Resp:  [18-20] 20 (10/24 0431) BP: (105-127)/(41-46) 105/42 mmHg (10/24 0431) SpO2:  [98 %-100 %] 100 % (10/24 0431) Weight:  [153 lb 3.2 oz (69.491 kg)] 153 lb 3.2 oz (69.491 kg) (10/24 0321)  Intake/Output from previous day: 10/23 0701 - 10/24 0700 In: 1200 [P.O.:1200] Out: 600 [Urine:600] Intake/Output this shift: Total I/O In: -  Out: 150 [Urine:150]  General appearance: alert, cooperative and no distress Heart: regular rate and rhythm Lungs: clear to auscultation bilaterally Abdomen: soft, non-tender; bowel sounds normal; no masses,  no organomegaly Extremities: edema trace Wound: clean and dry  Lab Results:  Recent Labs  06/05/13 0431 06/06/13 0540  WBC 12.7* 13.8*  HGB 9.2* 9.3*  HCT 28.4* 27.7*  PLT 159 176   BMET:  Recent Labs  06/05/13 0431 06/06/13 0540  NA 140 137  K 3.8 3.8  CL 104 102  CO2 27 25  GLUCOSE 109* 99  BUN 37* 37*  CREATININE 1.05 1.09  CALCIUM 8.6 8.5    PT/INR: No results found for this basename: LABPROT, INR,  in the last 72 hours ABG    Component Value Date/Time   PHART 7.443 06/01/2013 0533   HCO3 22.0 06/01/2013 0533   TCO2 19 06/01/2013 1708   ACIDBASEDEF 2.0 06/01/2013 0533   O2SAT 68.0 06/03/2013 0945    CBG (last 3)   Recent Labs  06/06/13 1630 06/06/13 2047 06/07/13 0552  GLUCAP 102* 212* 76    Assessment/Plan: S/P Procedure(s) (LRB): Coronary Artery Bypass Grafting Times Three Using Left Internal Mammary Artery and Right Saphenous Leg Vein Harvested Endoscopically (N/A) MITRAL VALVE REPAIR (MVR) (N/A) INTRAOPERATIVE TRANSESOPHAGEAL ECHOCARDIOGRAM (N/A)  1. CV- NSR rate and pressure controlled- continue Amiodarone, Lisinopril, Lopressor 2. Pulm-off oxygen, continue IS 3. Renal- creatinine normal, mildly volume overloaded 4. DM- CBGs controlled 5. Deconditioning- patient's family decided to take patient home 6. Dispo- patient stable, will plan to d/c patient home in AM   LOS: 7 days    BARRETT, ERIN 06/07/2013  patient examined and medical record reviewed,agree with above note. VAN TRIGT III,Januel Doolan 06/07/2013

## 2013-06-07 NOTE — Discharge Summary (Signed)
[address]       [place] [address]             [phone]              Discharge Summary  Name: Gabriela [name] DOB: 1925-09-09 79 y.o. MRN: [id number]   Admission Date: 05/31/2013 Discharge Date:     Admitting Diagnosis: Coronary artery disease Moderate to severe mitral regurgitation   Discharge Diagnosis:  Coronary artery disease Moderate to severe mitral regurgitation Expected postop blood loss anemia  Past Medical History  Diagnosis Date  . Hypertension   . Diabetes mellitus   . Hyperlipidemia   . Arthritis   . Glaucoma   . Shortness of breath     with exertion      Procedures: CORONARY ARTERY BYPASS GRAFTING x 3 (Left internal mammary artery to left anterior descending, saphenous vein graft to right coronary artery, saphenous vein graft to obtuse marginal 1) ENDOSCOPIC VEIN HARVEST RIGHT THIGH MITRAL VALVE REPAIR (24 mm Edwards Physio II annuloplasty ring) - 05/31/2013   HPI:  The patient is a 79 y.o. female who recently presented with new onset dyspnea on exertion and decreased exercise tolerance.  She denies chest pain, orthopnea or PND.  She was referred to Dr. Nehemiah Massed and underwent a myocardial perfusion study, which was abnormal, and a 2-D echocardiogram which showed EF 30%, with moderate to severe mitral regurgitation. She then underwent cardiac catheterization which demonstrated left main and severe three-vessel CAD. A cardiothoracic surgical evaluation was requested. Dr. Prescott Gum saw the patient in the office and reviewed her studies.  He agreed with the need for CABG and mitral valve repair or replacement.  All risks, benefits and alternatives of surgery were explained in detail, and the patient agreed to proceed.    Hospital Course:  The patient was admitted to [facility] on 05/31/2013. The patient was taken to the operating room and underwent the above procedure.    The postoperative course was notable for  hypotension in the initial period, requiring IV pressors.  She since has been weaned from all drips, and a low dose beta blocker was initiated.  She also had several brief, self-limited runs of atrial fibrillation while in the ICU, and was started on po Amiodarone.  She remained in the ICU for further monitoring, and was able to be transferred to stepdown on postop day 4.   She has continued to progress well.  Her rhythm has remained stable, and blood pressures have been trending up, allowing her to be started on an ACE-inhibitor.  She has been volume overloaded, and was started on Lasix, to which she is responding well.  Incisions are healing well. Her blood sugars have been controlled on Levemir, and her po intake is improving.  She is ambulating in the halls with cardiac rehab and is doing well with mobility.  We anticipate discharge home with family in the next 24 hours provided no acute changes occur.      Recent vital signs:  Filed Vitals:   06/07/13 0431  BP: 105/42  Pulse: 77  Temp: 98.4 F (36.9 C)  Resp: 20    Recent laboratory studies:  CBC: Recent Labs  06/05/13 0431 06/06/13 0540  WBC 12.7* 13.8*  HGB 9.2* 9.3*  HCT 28.4* 27.7*  PLT 159 176   BMET:  Recent Labs  06/05/13 0431 06/06/13 0540  NA 140 137  K 3.8 3.8  CL 104 102  CO2  27 25  GLUCOSE 109* 99  BUN 37* 37*  CREATININE 1.05 1.09  CALCIUM 8.6 8.5    PT/INR: No results found for this basename: LABPROT, INR,  in the last 72 hours   Discharge Medications:      Medication List    STOP taking these medications       aspirin 81 MG tablet  Replaced by:  aspirin 325 MG EC tablet     carvedilol 3.125 MG tablet  Commonly known as:  COREG     hydrochlorothiazide 25 MG tablet  Commonly known as:  HYDRODIURIL      TAKE these medications       amiodarone 200 MG tablet  Commonly known as:  PACERONE  Take 1 tablet (200 mg total) by mouth daily.     aspirin 325 MG EC tablet  Take 1 tablet (325  mg total) by mouth daily.     atorvastatin 20 MG tablet  Commonly known as:  LIPITOR  Take 1 tablet (20 mg total) by mouth daily at 6 PM.     brimonidine 0.1 % Soln  Commonly known as:  ALPHAGAN P  Place 1 drop into both eyes every 8 (eight) hours.     dorzolamide 2 % ophthalmic solution  Commonly known as:  TRUSOPT  Place 1 drop into both eyes 3 (three) times daily.     furosemide 40 MG tablet  Commonly known as:  LASIX  Take 1 tablet (40 mg total) by mouth daily. For 7 Days     insulin detemir 100 UNIT/ML injection  Commonly known as:  LEVEMIR  Inject 20 Units into the skin daily before supper.     lisinopril 2.5 MG tablet  Commonly known as:  PRINIVIL,ZESTRIL  Take 1 tablet (2.5 mg total) by mouth daily.     metoprolol tartrate 25 MG tablet  Commonly known as:  LOPRESSOR  Take 0.5 tablets (12.5 mg total) by mouth 2 (two) times daily.     pioglitazone 45 MG tablet  Commonly known as:  ACTOS  Take 45 mg by mouth daily.     potassium chloride 10 MEQ tablet  Commonly known as:  K-DUR,KLOR-CON  Take 1 tablet (10 mEq total) by mouth daily. For 7 days     traMADol 50 MG tablet  Commonly known as:  ULTRAM  Take 1 tablet (50 mg total) by mouth every 6 (six) hours as needed.     travoprost (benzalkonium) 0.004 % ophthalmic solution  Commonly known as:  TRAVATAN  Place 1 drop into both eyes at bedtime.           Discharge Instructions:  The patient is to refrain from driving, heavy lifting or strenuous activity.  May shower daily and clean incisions with soap and water.  May resume regular diet.   Follow Up:       Follow-up Information   Follow up with Corey Skains, MD. Schedule an appointment as soon as possible for a visit in 2 weeks.   Specialty:  Internal Medicine   Contact information:   [facility] [place] [address] [phone]       Follow up with VAN Wilber Oliphant, MD In 3 weeks. (Office will contact you with an  appointment)    Specialty:  Cardiothoracic Surgery   Contact information:   [address] [place]  [address] [phone]       The patient has been discharged on:  1.Beta Blocker: Yes [  x ]  No [ ]   If No, reason:    2.Ace Inhibitor/ARB: Yes [ x ]  No [  ]  If No, reason:    3.Statin: Yes [ x ]  No [ ]   If No, reason:    4.Ecasa: Yes [ x ]  No [ ]   If No, reason:   COLLINS,GINA H 06/07/2013, 9:54 AM

## 2013-06-07 NOTE — Progress Notes (Signed)
CARDIAC REHAB PHASE I   PRE:  Rate/Rhythm: 79 Sr  BP:  Supine:   Sitting: 100/50  Standing:    SaO2: 96 RA  MODE:  Ambulation: 210 ft   POST:  Rate/Rhythm: 93 SR  BP:  Supine:   Sitting: 112/60  Standing:    SaO2: 97 RA 1010-1035 Assisted X 1 and used walker to ambulate. Gait steady with walker. Pt tires easily. She c/o of SOB with walking and getting hot. Pt was able to walk 210 feet with several standing rest stops. VS stable . Pt to bathroom, then to bed to have pacemaker wires pulled.As she rested states that her SOB is better and that she has cooled now.  Rodney Langton RN 06/07/2013 10:48 AM

## 2013-06-07 NOTE — Progress Notes (Signed)
Physical Therapy Treatment Patient Details Name: Gabriela [name] MRN: [id number] DOB: [birth year] Today's Date: 06/07/2013 Time: [id number] PT Time Calculation (min): 24 min  PT Assessment / Plan / Recommendation  History of Present Illness Pt s/p cabgx3   PT Comments   Pt progressing and able to ambulate. Pt planning to d/c to [facility] instead of SNF. Pt has steps in house but declined practice due to fatigue; pt able to march [facility] for stairs.  Follow Up Recommendations  Home health PT;Supervision/Assistance - 24 hour     Does the patient have the potential to tolerate intense rehabilitation     Barriers to Discharge        Equipment Recommendations  None recommended by PT    Recommendations for Other Services    Frequency Min 3X/week   Progress towards PT Goals Progress towards PT goals: Progressing toward goals  Plan Discharge plan needs to be updated    Precautions / Restrictions Precautions Precautions: Fall;Sternal Precaution Comments: Pt stated "I'm not supposed to use my arms much." Pt reeducated on precautions Restrictions Weight Bearing Restrictions: No   Pertinent Vitals/Pain Patient had no complaints of pain throughout tx.      Mobility  Transfers Transfers: Sit to Stand Sit to Stand: 4: Min guard;From chair/3-in-1 Stand to Sit: 5: Supervision;To chair/3-in-1 Ambulation/Gait Ambulation/Gait Assistance: 4: Min guard Ambulation Distance (Feet): 200 Feet Assistive device: Rolling walker Ambulation/Gait Assistance Details: Pt became SOB, requiring 2 standing rest breaks.   Gait Pattern: Step-through pattern Gait velocity: slow Stairs: No    Exercises     PT Diagnosis:    PT Problem List:   PT Treatment Interventions:     PT Goals (current goals can now be found in the care plan section)    Visit Information  Last PT Received On: 06/07/13 Assistance Needed: +1 History of Present Illness: Pt s/p cabgx3    Subjective Data      Cognition   Cognition Arousal/Alertness: Awake/alert Behavior During Therapy: WFL for tasks assessed/performed Overall Cognitive Status: Within Functional Limits for tasks assessed    Balance     End of Session PT - End of Session Equipment Utilized During Treatment: Gait belt Activity Tolerance: Patient tolerated treatment well;Patient limited by fatigue Patient left: in chair Nurse Communication: Mobility status   GP     [name], [place] 06/07/2013, 3:00 PM   Seen and agreed with above note and discharge update Julio Sicks, PTA [phone]

## 2013-06-08 LAB — GLUCOSE, CAPILLARY: Glucose-Capillary: 196 mg/dL — ABNORMAL HIGH (ref 70–99)

## 2013-06-08 MED ORDER — AMIODARONE HCL 200 MG PO TABS: 200.0000 mg | ORAL_TABLET | Freq: Every day | ORAL | Status: DC

## 2013-06-08 MED ORDER — ATORVASTATIN CALCIUM 20 MG PO TABS: 20.0000 mg | ORAL_TABLET | Freq: Every day | ORAL | Status: DC

## 2013-06-08 MED ORDER — POTASSIUM CHLORIDE CRYS ER 10 MEQ PO TBCR: 10.0000 meq | EXTENDED_RELEASE_TABLET | Freq: Every day | ORAL | Status: DC

## 2013-06-08 MED ORDER — METOPROLOL TARTRATE 25 MG PO TABS: 12.5000 mg | ORAL_TABLET | Freq: Two times a day (BID) | ORAL | Status: DC

## 2013-06-08 MED ORDER — ASPIRIN 325 MG PO TBEC: 325.0000 mg | DELAYED_RELEASE_TABLET | Freq: Every day | ORAL | Status: DC

## 2013-06-08 MED ORDER — TRAMADOL HCL 50 MG PO TABS: 50.0000 mg | ORAL_TABLET | Freq: Four times a day (QID) | ORAL | Status: DC | PRN

## 2013-06-08 MED ORDER — LISINOPRIL 2.5 MG PO TABS: 2.5000 mg | ORAL_TABLET | Freq: Every day | ORAL | Status: DC

## 2013-06-08 MED ORDER — FUROSEMIDE 40 MG PO TABS: 40.0000 mg | ORAL_TABLET | Freq: Every day | ORAL | Status: DC

## 2013-06-08 NOTE — Progress Notes (Addendum)
      [address]       [place] [address]             [phone]      8 Days Post-Op Procedure(s) (LRB): Coronary Artery Bypass Grafting Times Three Using Left Internal Mammary Artery and Right Saphenous Leg Vein Harvested Endoscopically (N/A) MITRAL VALVE REPAIR (MVR) (N/A) INTRAOPERATIVE TRANSESOPHAGEAL ECHOCARDIOGRAM (N/A)  Subjective:  Gabriela [name] is without complaints this morning.  She is anxious to go home today. + ambulation + BM  Objective: Vital signs in last 24 hours: Temp:  [99.2 F (37.3 C)-99.5 F (37.5 C)] 99.2 F (37.3 C) (10/25 0429) Pulse Rate:  [82-91] 82 (10/25 0429) Cardiac Rhythm:  [-] Normal sinus rhythm (10/25 0804) Resp:  [18] 18 (10/25 0429) BP: (86-129)/(41-74) 128/48 mmHg (10/25 0429) SpO2:  [96 %-98 %] 98 % (10/25 0429) Weight:  [153 lb (69.4 kg)] 153 lb (69.4 kg) (10/25 0429)  Intake/Output from previous day: 10/24 0701 - 10/25 0700 In: 480 [P.O.:480] Out: 750 [Urine:750]  General appearance: alert, cooperative and no distress Heart: regular rate and rhythm Lungs: clear to auscultation bilaterally Abdomen: soft, non-tender; bowel sounds normal; no masses,  no organomegaly Extremities: edema trace Wound: clean and dry  Lab Results:  Recent Labs  06/06/13 0540  WBC 13.8*  HGB 9.3*  HCT 27.7*  PLT 176   BMET:  Recent Labs  06/06/13 0540  NA 137  K 3.8  CL 102  CO2 25  GLUCOSE 99  BUN 37*  CREATININE 1.09  CALCIUM 8.5    PT/INR: No results found for this basename: LABPROT, INR,  in the last 72 hours ABG    Component Value Date/Time   PHART 7.443 06/01/2013 0533   HCO3 22.0 06/01/2013 0533   TCO2 19 06/01/2013 1708   ACIDBASEDEF 2.0 06/01/2013 0533   O2SAT 68.0 06/03/2013 0945   CBG (last 3)   Recent Labs  06/07/13 1212 06/07/13 1556 06/07/13 2105  GLUCAP 162* 86 150*    Assessment/Plan: S/P Procedure(s) (LRB): Coronary Artery Bypass Grafting Times Three Using Left Internal Mammary  Artery and Right Saphenous Leg Vein Harvested Endoscopically (N/A) MITRAL VALVE REPAIR (MVR) (N/A) INTRAOPERATIVE TRANSESOPHAGEAL ECHOCARDIOGRAM (N/A)  1. CV- NSR good rate and pressure control- continue Lopressor, Lisinopril, Amiodarone 2. Pulm- no acute issues, encouraged use of IS at discharge 3. Renal- creatinine normal, mildly volume overloaded 4. DM-CBGS controlled 5. Dispo- patient doing well, will d/c home today, home health is arranged   LOS: 8 days    [name], Gabriela 06/08/2013  I have seen and examined the patient and agree with the assessment and plan as outlined.  Gabriela [name] H 06/08/2013 11:34 AM

## 2013-06-08 NOTE — Progress Notes (Signed)
Agree with change in D/C disposition per PTA. [place], [place], [place]

## 2013-06-08 NOTE — Progress Notes (Signed)
Pt and daughter given d/c instructions; both verbalized understanding; pt daughter not wanting to wait for walker to be delivered to room; will cont. To monitor.

## 2013-06-08 NOTE — Progress Notes (Signed)
Chest tube sutures removed at this time; steri strips applied to site; will cont. To monitor.

## 2013-06-08 NOTE — Progress Notes (Signed)
[id number] [facility] d/c education completed with patient and patient's daughter including IS use, sternal precautions, wound care, risk factor modification and exercise guidelines. Heart healthy and diabetic diet sheet given. Discussed Phase 2 cardiac rehab and patient is interested in the program at [facility] as she will be staying with her daughter in [place] for the time being. Permission given to contact patient at daughter's home for cardiac rehab referral. Pt voices understanding of instructions given.  Seward Carol, MS, ACSM CES

## 2013-06-08 NOTE — Progress Notes (Signed)
Tele monitor d/c at this time; pt to d/c home with daughter; will cont. To monitor.

## 2013-06-08 NOTE — Progress Notes (Signed)
CARE MANAGEMENT NOTE 06/08/2013  Patient:  Gabriela [name], Gabriela [name]   Account Number:  [id number]  Date Initiated:  06/03/2013  Documentation initiated by:  [facility] [address]  Subjective/Objective Assessment:   Post op MVR and CABG x3.     Action/Plan:   Anticipated DC Date:  06/08/2013   Anticipated DC Plan:  [place] referral  Clinical Social Worker      DC Forensic scientist  CM consult      [facility] Choice  HOME HEALTH   Choice offered to / List presented to:  C-1 Patient        [place] arranged  HH-1 RN  [place]   Status of service:  In process, will continue to follow Medicare Important Message given?   (If response is "NO", the following Medicare IM given date fields will be blank) Date Medicare IM given:   Date Additional Medicare IM given:    Discharge Disposition:  [place]  Per UR Regulation:  Reviewed for med. necessity/level of care/duration of stay  If discussed at [place] of Stay Meetings, dates discussed:   06/06/2013    Comments:  06/08/13 12:15 CM notified [facility] of discharge and rolling walker will be delivered to room prior to discharge.  No other CM needs were communicated.  Mariane Masters, BSN, CM [phone].  Contact:  [name],Gabriela Daughter     [phone]                 Gabriela [name] [phone]  06/07/13 Gabriela AMERSON,RN,BSN [id number] SPOKE WITH PT'S DAUGHTER, Gabriela [name], BY PHONE (CELL [phone]).  [place]. EXPLAINED SERVICES THAT WILL BE PROVIDED, AND ESTIMATED # OF VISITS PER WEEK.  DAUGHTER STATES SHE WILL MAKE ARRANGEMENTS FOR 24H CARE AT HER HOME FOR PT, AS SHE WORKS 8A-5P DAILY.  SHE VERBALIZES UNDERSTANDING THAT PT WILL NEED 24H CARE FOR 10DAYS TO 2 WEEKS, AND STATES SHE DOES NOT WANT SNF PLACEMENT FOR HER MOM. DC ADDRESS:  [address],  [place] [address]                          PHONE:  [phone]  06/06/13 Gabriela AMERSON,RN,BSN [id number] PT/FAMILY NOW WOULD LIKE FOR PT TO DC HOME WITH DAUGHTER Gabriela IN [place] (PHONE [phone]).  MET WITH PT AND OTHER DAUGHTER Gabriela [name], AND THEY AGREE WITH THIS PLAN.  LEFT HH AGENCY LIST FOR [facility] FOR DAUGHTER'S REFERENCE.  SHE PLANS TO VISIT THIS EVENING AFTER WORK.  WILL FOLLOW UP WITH PT/DAUGHTER ON HH SELECTION.  PT STATES SHE HAS RW, BSC, SHOWER CHAIR AND ELEV TOILET SEAT AT HOME.  06-03-13 3:30pm Gabriela [name], RNBSN - [id number] Talked with daughter by phone.  Patient tired and new onset Afib today.  Daughter confirmed Mom lived and alone and was "fairly" independent prior to admission.   Will be at hospital today at about 4:15 and will continue this discussion for post discharge planning.  Talked with daughter and patient in room.  Both agree SNF rehab is best place.  Patient lives in [place] but daughter lives in [place].  Both agree would want [facility] facility.  Patient would also  like pvt room. Consult placed to SW.  PT already ordered and suggesting SNF.

## 2013-06-09 DIAGNOSIS — Z48812 Encounter for surgical aftercare following surgery on the circulatory system: Secondary | ICD-10-CM | POA: Diagnosis not present

## 2013-06-09 DIAGNOSIS — M6281 Muscle weakness (generalized): Secondary | ICD-10-CM | POA: Diagnosis not present

## 2013-06-09 DIAGNOSIS — R269 Unspecified abnormalities of gait and mobility: Secondary | ICD-10-CM | POA: Diagnosis not present

## 2013-06-09 DIAGNOSIS — E119 Type 2 diabetes mellitus without complications: Secondary | ICD-10-CM | POA: Diagnosis not present

## 2013-06-09 DIAGNOSIS — I2510 Atherosclerotic heart disease of native coronary artery without angina pectoris: Secondary | ICD-10-CM | POA: Diagnosis not present

## 2013-06-10 LAB — GLUCOSE, CAPILLARY: Glucose-Capillary: 93 mg/dL (ref 70–99)

## 2013-06-11 DIAGNOSIS — M6281 Muscle weakness (generalized): Secondary | ICD-10-CM | POA: Diagnosis not present

## 2013-06-11 DIAGNOSIS — E119 Type 2 diabetes mellitus without complications: Secondary | ICD-10-CM | POA: Diagnosis not present

## 2013-06-11 DIAGNOSIS — R269 Unspecified abnormalities of gait and mobility: Secondary | ICD-10-CM | POA: Diagnosis not present

## 2013-06-11 DIAGNOSIS — I2510 Atherosclerotic heart disease of native coronary artery without angina pectoris: Secondary | ICD-10-CM | POA: Diagnosis not present

## 2013-06-11 DIAGNOSIS — Z48812 Encounter for surgical aftercare following surgery on the circulatory system: Secondary | ICD-10-CM | POA: Diagnosis not present

## 2013-06-12 NOTE — H&P (Signed)
Gabriela [name], STCHARLES NO.:  [id number]  MEDICAL RECORD NO.:  [id number]  LOCATION:  2W22C                        FACILITY:  [facility]  PHYSICIAN:  Ivin Poot, M.D.  DATE OF BIRTH:  02-13-1926  DATE OF ADMISSION:  05/31/2013 DATE OF DISCHARGE:  06/08/2013                             HISTORY & PHYSICAL   ADMISSION DIAGNOSES/CHIEF COMPLAINT: 1. Shortness of breath. 2. Severe 3-vessel coronary artery disease with reduced ejection     fraction. 3. Moderate mitral regurgitation.  HISTORY OF PRESENT ILLNESS:  Gabriela [name] is an 79 year old African American female, who I saw in consultation in the office in September for evaluation of progressive dyspnea on exertion and decreasing exercise tolerance.  Her cardiologist in [place], Dr. Nehemiah Massed evaluated the patient with a myocardial perfusion scan, which was abnormal, and a 2-D echocardiogram, which showed reduced LV function with EF of 30%, and moderate-to-severe mitral regurgitation.  Subsequent left heart cath by Dr. Nehemiah Massed, demonstrated left main and three-vessel disease in a diabetic-type pattern.  At age 64, she has been living independently and remains active, so she was felt to be a potential surgical candidate for her heart disease.  I examined the patient in the office and reviewed her echocardiogram and coronary arteriograms and felt that she was acceptable candidate for combined CABG with mitral valve repair-replacement, but at increased risk due to her advanced age. She is admitted today for surgery.  She has had no change in her symptoms since her initial evaluation.  Her preoperative studies have documented that she is mildly anemic, her chest x-ray shows no overt CHF, her carotid duplex study showed no significant carotid disease, and that her urinalysis is clear.  PAST MEDICAL HISTORY: 1. Hypertension. 2. Diabetes mellitus. 3. Dyslipidemia. 4. Arthritis. 5. Glaucoma.  PAST SURGICAL  HISTORY:  Status post right hip replacement.  SOCIAL HISTORY:  The patient does not smoke or use alcohol.  She has strong family support, but is currently living independently.  She takes care of the home.  FAMILY HISTORY:  Positive for diabetes, coronary artery disease, and hypertension.  HOME MEDICATIONS:  Aspirin 81 mg, Coreg 3.125 mg daily, HCTZ 25 mg a day, lisinopril 5 mg a day, and Actos 45 mg daily.  REVIEW OF SYSTEMS:  Review of systems are basically unchanged from her initial evaluation approximately 5 weeks ago.  She has had no progression in her symptoms of class III heart failure.  She has no symptoms of upper respiratory infection or fever or other signs of infection.  PHYSICAL EXAMINATION:  VITAL SIGNS:  Blood pressure 100/55, pulse 80 and regular, weight 150 pounds, height 5 feet 4 inches, oxygen saturation on room air 96%. GENERAL APPEARANCE:  Elderly African American female, presents for surgery in no acute distress. HEENT:  Normocephalic.  Pupils equal. NECK:  No JVD mass or bruit. LYMPHATICS:  No palpable adenopathy in the neck or supraclavicular fossa. THORAX:  Breath sounds are clear.  No deformity. CARDIAC:  Regular rhythm with a 2/6 holosystolic murmur. ABDOMEN:  Soft, nontender without pulsatile mass. EXTREMITIES:  No clubbing, cyanosis, or edema.  VASCULAR:  No palpable pedal pulses, but no obvious venous insufficiency of the legs. NEUROLOGIC:  No focal motor deficit.  PLAN:  For admission, the patient will be admitted for surgery on June 01, 2013, for multivessel bypass grafting, and combined mitral valve repair-replacement.     Ivin Poot, M.D.     PV/MEDQ  D:  06/11/2013  T:  06/12/2013  Job:  [id number]

## 2013-06-13 ENCOUNTER — Telehealth (HOSPITAL_COMMUNITY): Payer: Self-pay | Admitting: *Deleted

## 2013-06-13 NOTE — Discharge Summary (Signed)
patient examined and medical record reviewed,agree with above note. VAN TRIGT III,Chelbie Jarnagin 06/13/2013

## 2013-06-14 DIAGNOSIS — E119 Type 2 diabetes mellitus without complications: Secondary | ICD-10-CM | POA: Diagnosis not present

## 2013-06-14 DIAGNOSIS — R269 Unspecified abnormalities of gait and mobility: Secondary | ICD-10-CM | POA: Diagnosis not present

## 2013-06-14 DIAGNOSIS — I2510 Atherosclerotic heart disease of native coronary artery without angina pectoris: Secondary | ICD-10-CM | POA: Diagnosis not present

## 2013-06-14 DIAGNOSIS — Z48812 Encounter for surgical aftercare following surgery on the circulatory system: Secondary | ICD-10-CM | POA: Diagnosis not present

## 2013-06-14 DIAGNOSIS — M6281 Muscle weakness (generalized): Secondary | ICD-10-CM | POA: Diagnosis not present

## 2013-06-17 DIAGNOSIS — Z48812 Encounter for surgical aftercare following surgery on the circulatory system: Secondary | ICD-10-CM | POA: Diagnosis not present

## 2013-06-17 DIAGNOSIS — E119 Type 2 diabetes mellitus without complications: Secondary | ICD-10-CM | POA: Diagnosis not present

## 2013-06-17 DIAGNOSIS — I2510 Atherosclerotic heart disease of native coronary artery without angina pectoris: Secondary | ICD-10-CM | POA: Diagnosis not present

## 2013-06-17 DIAGNOSIS — M6281 Muscle weakness (generalized): Secondary | ICD-10-CM | POA: Diagnosis not present

## 2013-06-17 DIAGNOSIS — R269 Unspecified abnormalities of gait and mobility: Secondary | ICD-10-CM | POA: Diagnosis not present

## 2013-06-18 DIAGNOSIS — M6281 Muscle weakness (generalized): Secondary | ICD-10-CM | POA: Diagnosis not present

## 2013-06-18 DIAGNOSIS — Z48812 Encounter for surgical aftercare following surgery on the circulatory system: Secondary | ICD-10-CM | POA: Diagnosis not present

## 2013-06-18 DIAGNOSIS — I2510 Atherosclerotic heart disease of native coronary artery without angina pectoris: Secondary | ICD-10-CM | POA: Diagnosis not present

## 2013-06-18 DIAGNOSIS — E119 Type 2 diabetes mellitus without complications: Secondary | ICD-10-CM | POA: Diagnosis not present

## 2013-06-18 DIAGNOSIS — R269 Unspecified abnormalities of gait and mobility: Secondary | ICD-10-CM | POA: Diagnosis not present

## 2013-06-19 DIAGNOSIS — M6281 Muscle weakness (generalized): Secondary | ICD-10-CM | POA: Diagnosis not present

## 2013-06-19 DIAGNOSIS — R269 Unspecified abnormalities of gait and mobility: Secondary | ICD-10-CM | POA: Diagnosis not present

## 2013-06-19 DIAGNOSIS — Z48812 Encounter for surgical aftercare following surgery on the circulatory system: Secondary | ICD-10-CM | POA: Diagnosis not present

## 2013-06-19 DIAGNOSIS — E119 Type 2 diabetes mellitus without complications: Secondary | ICD-10-CM | POA: Diagnosis not present

## 2013-06-19 DIAGNOSIS — I2510 Atherosclerotic heart disease of native coronary artery without angina pectoris: Secondary | ICD-10-CM | POA: Diagnosis not present

## 2013-06-21 DIAGNOSIS — I2510 Atherosclerotic heart disease of native coronary artery without angina pectoris: Secondary | ICD-10-CM | POA: Diagnosis not present

## 2013-06-21 DIAGNOSIS — M6281 Muscle weakness (generalized): Secondary | ICD-10-CM | POA: Diagnosis not present

## 2013-06-21 DIAGNOSIS — E119 Type 2 diabetes mellitus without complications: Secondary | ICD-10-CM | POA: Diagnosis not present

## 2013-06-21 DIAGNOSIS — Z48812 Encounter for surgical aftercare following surgery on the circulatory system: Secondary | ICD-10-CM | POA: Diagnosis not present

## 2013-06-21 DIAGNOSIS — R269 Unspecified abnormalities of gait and mobility: Secondary | ICD-10-CM | POA: Diagnosis not present

## 2013-06-25 DIAGNOSIS — M6281 Muscle weakness (generalized): Secondary | ICD-10-CM | POA: Diagnosis not present

## 2013-06-25 DIAGNOSIS — I2510 Atherosclerotic heart disease of native coronary artery without angina pectoris: Secondary | ICD-10-CM | POA: Diagnosis not present

## 2013-06-25 DIAGNOSIS — R269 Unspecified abnormalities of gait and mobility: Secondary | ICD-10-CM | POA: Diagnosis not present

## 2013-06-25 DIAGNOSIS — E119 Type 2 diabetes mellitus without complications: Secondary | ICD-10-CM | POA: Diagnosis not present

## 2013-06-25 DIAGNOSIS — Z48812 Encounter for surgical aftercare following surgery on the circulatory system: Secondary | ICD-10-CM | POA: Diagnosis not present

## 2013-06-26 DIAGNOSIS — I2510 Atherosclerotic heart disease of native coronary artery without angina pectoris: Secondary | ICD-10-CM | POA: Diagnosis not present

## 2013-06-26 DIAGNOSIS — M6281 Muscle weakness (generalized): Secondary | ICD-10-CM | POA: Diagnosis not present

## 2013-06-26 DIAGNOSIS — E119 Type 2 diabetes mellitus without complications: Secondary | ICD-10-CM | POA: Diagnosis not present

## 2013-06-26 DIAGNOSIS — R269 Unspecified abnormalities of gait and mobility: Secondary | ICD-10-CM | POA: Diagnosis not present

## 2013-06-26 DIAGNOSIS — Z48812 Encounter for surgical aftercare following surgery on the circulatory system: Secondary | ICD-10-CM | POA: Diagnosis not present

## 2013-06-27 DIAGNOSIS — M6281 Muscle weakness (generalized): Secondary | ICD-10-CM | POA: Diagnosis not present

## 2013-06-27 DIAGNOSIS — I2510 Atherosclerotic heart disease of native coronary artery without angina pectoris: Secondary | ICD-10-CM | POA: Diagnosis not present

## 2013-06-27 DIAGNOSIS — R269 Unspecified abnormalities of gait and mobility: Secondary | ICD-10-CM | POA: Diagnosis not present

## 2013-06-27 DIAGNOSIS — Z48812 Encounter for surgical aftercare following surgery on the circulatory system: Secondary | ICD-10-CM | POA: Diagnosis not present

## 2013-06-27 DIAGNOSIS — E119 Type 2 diabetes mellitus without complications: Secondary | ICD-10-CM | POA: Diagnosis not present

## 2013-06-28 DIAGNOSIS — Z48812 Encounter for surgical aftercare following surgery on the circulatory system: Secondary | ICD-10-CM | POA: Diagnosis not present

## 2013-06-28 DIAGNOSIS — R269 Unspecified abnormalities of gait and mobility: Secondary | ICD-10-CM | POA: Diagnosis not present

## 2013-06-28 DIAGNOSIS — I2510 Atherosclerotic heart disease of native coronary artery without angina pectoris: Secondary | ICD-10-CM | POA: Diagnosis not present

## 2013-06-28 DIAGNOSIS — M6281 Muscle weakness (generalized): Secondary | ICD-10-CM | POA: Diagnosis not present

## 2013-06-28 DIAGNOSIS — E119 Type 2 diabetes mellitus without complications: Secondary | ICD-10-CM | POA: Diagnosis not present

## 2013-07-01 ENCOUNTER — Other Ambulatory Visit: Payer: Self-pay | Admitting: *Deleted

## 2013-07-01 DIAGNOSIS — I2510 Atherosclerotic heart disease of native coronary artery without angina pectoris: Secondary | ICD-10-CM

## 2013-07-02 DIAGNOSIS — M6281 Muscle weakness (generalized): Secondary | ICD-10-CM | POA: Diagnosis not present

## 2013-07-02 DIAGNOSIS — Z48812 Encounter for surgical aftercare following surgery on the circulatory system: Secondary | ICD-10-CM | POA: Diagnosis not present

## 2013-07-02 DIAGNOSIS — R269 Unspecified abnormalities of gait and mobility: Secondary | ICD-10-CM | POA: Diagnosis not present

## 2013-07-02 DIAGNOSIS — I2510 Atherosclerotic heart disease of native coronary artery without angina pectoris: Secondary | ICD-10-CM | POA: Diagnosis not present

## 2013-07-02 DIAGNOSIS — E119 Type 2 diabetes mellitus without complications: Secondary | ICD-10-CM | POA: Diagnosis not present

## 2013-07-03 ENCOUNTER — Ambulatory Visit: Payer: Medicare Other | Admitting: Cardiothoracic Surgery

## 2013-07-03 ENCOUNTER — Encounter: Payer: Self-pay | Admitting: Cardiothoracic Surgery

## 2013-07-03 ENCOUNTER — Ambulatory Visit (INDEPENDENT_AMBULATORY_CARE_PROVIDER_SITE_OTHER): Payer: Medicare Other | Admitting: Cardiothoracic Surgery

## 2013-07-03 ENCOUNTER — Ambulatory Visit
Admission: RE | Admit: 2013-07-03 | Discharge: 2013-07-03 | Disposition: A | Discharge status: Home / Self Care | Payer: Medicare Other | Source: Ambulatory Visit | Attending: Cardiothoracic Surgery | Admitting: Cardiothoracic Surgery

## 2013-07-03 VITALS — BP 125/57 | HR 80 | Resp 18 | Ht 63.0 in | Wt 152.0 lb

## 2013-07-03 DIAGNOSIS — I2510 Atherosclerotic heart disease of native coronary artery without angina pectoris: Secondary | ICD-10-CM

## 2013-07-03 DIAGNOSIS — Z452 Encounter for adjustment and management of vascular access device: Secondary | ICD-10-CM | POA: Diagnosis not present

## 2013-07-03 DIAGNOSIS — Z9889 Other specified postprocedural states: Secondary | ICD-10-CM

## 2013-07-03 DIAGNOSIS — J90 Pleural effusion, not elsewhere classified: Secondary | ICD-10-CM | POA: Diagnosis not present

## 2013-07-03 NOTE — Progress Notes (Signed)
PCP is Eulas Post, MD Referring Provider is Corey Skains, MD  Chief Complaint  [name] presents with  . Routine Post Op    3 wk f/u post CABG on 05/28/13    HPI: Gabriela [name] returns for her first postop office visit following combined CABG and mitral valve annuloplasty repair for severe CAD with ischemic MR. She has done well at home. She is maintaining sinus rhythm on oral amiodarone 200 mg daily--no Coumadin. Her incisions are healing well. She has mild residual edema. Her chest x-ray shows a small to moderate left pleural effusion . She is receiving home therapies of physical therapy Past Medical History  Diagnosis Date  . Hypertension   . Diabetes mellitus   . Hyperlipidemia   . Arthritis   . Glaucoma   . Shortness of breath     with exertion    Past Surgical History  Procedure Laterality Date  . Hip surgery      right  . Joint replacement    . Cholecystectomy    . Tonsillectomy    . Cardiac catheterization  04/22/13  . Coronary artery bypass graft N/A 05/31/2013    Procedure: Coronary Artery Bypass Grafting Times Three Using Left Internal Mammary Artery and Right Saphenous Leg Vein Harvested Endoscopically;  Surgeon: Ivin Poot, MD;  Location: [facility];  Service: Open Heart Surgery;  Laterality: N/A;  . Mitral valve repair N/A 05/31/2013    Procedure: MITRAL VALVE REPAIR (MVR);  Surgeon: Ivin Poot, MD;  Location: [facility];  Service: Open Heart Surgery;  Laterality: N/A;  . Intraoperative transesophageal echocardiogram N/A 05/31/2013    Procedure: INTRAOPERATIVE TRANSESOPHAGEAL ECHOCARDIOGRAM;  Surgeon: Ivin Poot, MD;  Location: [facility];  Service: Open Heart Surgery;  Laterality: N/A;    Family History  Problem Relation Age of Onset  . Diabetes Sister     Social History History  Substance Use Topics  . Smoking status: Never Smoker   . Smokeless tobacco: Never Used  . Alcohol Use: No    Current Outpatient Prescriptions  Medication Sig  Dispense Refill  . amiodarone (PACERONE) 200 MG tablet Take 1 tablet (200 mg total) by mouth daily.  30 tablet  3  . aspirin EC 325 MG EC tablet Take 1 tablet (325 mg total) by mouth daily.  30 tablet  0  . atorvastatin (LIPITOR) 20 MG tablet Take 1 tablet (20 mg total) by mouth daily at 6 PM.  30 tablet  3  . brimonidine (ALPHAGAN P) 0.1 % SOLN Place 1 drop into both eyes every 8 (eight) hours.      . dorzolamide (TRUSOPT) 2 % ophthalmic solution Place 1 drop into both eyes 3 (three) times daily.      . furosemide (LASIX) 40 MG tablet Take 1 tablet (40 mg total) by mouth daily. For 7 Days  7 tablet  0  . insulin detemir (LEVEMIR) 100 UNIT/ML injection Inject 20 Units into Gabriela skin daily before supper.      Marland Kitchen lisinopril (PRINIVIL,ZESTRIL) 2.5 MG tablet Take 1 tablet (2.5 mg total) by mouth daily.  30 tablet  3  . metoprolol tartrate (LOPRESSOR) 25 MG tablet Take 0.5 tablets (12.5 mg total) by mouth 2 (two) times daily.  30 tablet  3  . pioglitazone (ACTOS) 45 MG tablet Take 45 mg by mouth daily.      . potassium chloride (K-DUR,KLOR-CON) 10 MEQ tablet Take 1 tablet (10 mEq total) by mouth daily. For 7 days  7 tablet  0  . traMADol (ULTRAM) 50 MG tablet Take 1 tablet (50 mg total) by mouth every 6 (six) hours as needed.  30 tablet  0  . travoprost, benzalkonium, (TRAVATAN) 0.004 % ophthalmic solution Place 1 drop into both eyes at bedtime.       No current facility-administered medications for this visit.    No Known Allergies  Review of Systems feel stronger after surgery, that her exercise tolerance. No significant pain. Mild problems with constipation and insomnia  BP 125/57  Pulse 80  Resp 18  Ht 5\' 3"  (1.6 m)  Wt 152 lb (68.947 kg)  BMI 26.93 kg/m2  SpO2 95% Physical Exam Alert and comfortable accompanied by daughter Lungs clear, slight diminished breath sounds at left base Sternal incision well-healed Heart rhythm regular without murmur Mild bilateral pedal edema  Diagnostic  Tests: Chest x-ray shows a mild to moderate left pleural effusion  Impression: Doing well one month after combined CABG-mitral valve annuloplasty repair in this 79 year old diabetic female. She will discontinue Gabriela amiodarone once her prescription runs out. I've re- started furosemide 40 mg daily for her left pleural effusion and mild pedal edema. She will return in 2-3 weeks with a chest x-ray to review and reassess Gabriela effusion. She is encouraged to walk more and use Gabriela incentive from her. She'll make an appointment to see her cardiologist.  Plan:

## 2013-07-04 DIAGNOSIS — I2510 Atherosclerotic heart disease of native coronary artery without angina pectoris: Secondary | ICD-10-CM | POA: Diagnosis not present

## 2013-07-04 DIAGNOSIS — E119 Type 2 diabetes mellitus without complications: Secondary | ICD-10-CM | POA: Diagnosis not present

## 2013-07-04 DIAGNOSIS — R269 Unspecified abnormalities of gait and mobility: Secondary | ICD-10-CM | POA: Diagnosis not present

## 2013-07-04 DIAGNOSIS — Z48812 Encounter for surgical aftercare following surgery on the circulatory system: Secondary | ICD-10-CM | POA: Diagnosis not present

## 2013-07-04 DIAGNOSIS — M6281 Muscle weakness (generalized): Secondary | ICD-10-CM | POA: Diagnosis not present

## 2013-07-05 DIAGNOSIS — Z48812 Encounter for surgical aftercare following surgery on the circulatory system: Secondary | ICD-10-CM | POA: Diagnosis not present

## 2013-07-05 DIAGNOSIS — R269 Unspecified abnormalities of gait and mobility: Secondary | ICD-10-CM | POA: Diagnosis not present

## 2013-07-05 DIAGNOSIS — E119 Type 2 diabetes mellitus without complications: Secondary | ICD-10-CM | POA: Diagnosis not present

## 2013-07-05 DIAGNOSIS — M6281 Muscle weakness (generalized): Secondary | ICD-10-CM | POA: Diagnosis not present

## 2013-07-05 DIAGNOSIS — I2510 Atherosclerotic heart disease of native coronary artery without angina pectoris: Secondary | ICD-10-CM | POA: Diagnosis not present

## 2013-07-08 ENCOUNTER — Ambulatory Visit: Payer: Medicare Other

## 2013-07-08 DIAGNOSIS — I2510 Atherosclerotic heart disease of native coronary artery without angina pectoris: Secondary | ICD-10-CM | POA: Diagnosis not present

## 2013-07-08 DIAGNOSIS — Z48812 Encounter for surgical aftercare following surgery on the circulatory system: Secondary | ICD-10-CM | POA: Diagnosis not present

## 2013-07-08 DIAGNOSIS — M6281 Muscle weakness (generalized): Secondary | ICD-10-CM | POA: Diagnosis not present

## 2013-07-08 DIAGNOSIS — R269 Unspecified abnormalities of gait and mobility: Secondary | ICD-10-CM | POA: Diagnosis not present

## 2013-07-08 DIAGNOSIS — E119 Type 2 diabetes mellitus without complications: Secondary | ICD-10-CM | POA: Diagnosis not present

## 2013-07-09 DIAGNOSIS — Z48812 Encounter for surgical aftercare following surgery on the circulatory system: Secondary | ICD-10-CM | POA: Diagnosis not present

## 2013-07-09 DIAGNOSIS — M6281 Muscle weakness (generalized): Secondary | ICD-10-CM | POA: Diagnosis not present

## 2013-07-09 DIAGNOSIS — I2510 Atherosclerotic heart disease of native coronary artery without angina pectoris: Secondary | ICD-10-CM | POA: Diagnosis not present

## 2013-07-09 DIAGNOSIS — R269 Unspecified abnormalities of gait and mobility: Secondary | ICD-10-CM | POA: Diagnosis not present

## 2013-07-09 DIAGNOSIS — E119 Type 2 diabetes mellitus without complications: Secondary | ICD-10-CM | POA: Diagnosis not present

## 2013-07-16 DIAGNOSIS — M6281 Muscle weakness (generalized): Secondary | ICD-10-CM | POA: Diagnosis not present

## 2013-07-16 DIAGNOSIS — R269 Unspecified abnormalities of gait and mobility: Secondary | ICD-10-CM | POA: Diagnosis not present

## 2013-07-16 DIAGNOSIS — Z48812 Encounter for surgical aftercare following surgery on the circulatory system: Secondary | ICD-10-CM | POA: Diagnosis not present

## 2013-07-16 DIAGNOSIS — I2510 Atherosclerotic heart disease of native coronary artery without angina pectoris: Secondary | ICD-10-CM | POA: Diagnosis not present

## 2013-07-16 DIAGNOSIS — E119 Type 2 diabetes mellitus without complications: Secondary | ICD-10-CM | POA: Diagnosis not present

## 2013-07-17 DIAGNOSIS — E119 Type 2 diabetes mellitus without complications: Secondary | ICD-10-CM | POA: Diagnosis not present

## 2013-07-17 DIAGNOSIS — I2510 Atherosclerotic heart disease of native coronary artery without angina pectoris: Secondary | ICD-10-CM | POA: Diagnosis not present

## 2013-07-17 DIAGNOSIS — Z48812 Encounter for surgical aftercare following surgery on the circulatory system: Secondary | ICD-10-CM | POA: Diagnosis not present

## 2013-07-17 DIAGNOSIS — M6281 Muscle weakness (generalized): Secondary | ICD-10-CM | POA: Diagnosis not present

## 2013-07-17 DIAGNOSIS — R269 Unspecified abnormalities of gait and mobility: Secondary | ICD-10-CM | POA: Diagnosis not present

## 2013-07-19 DIAGNOSIS — E119 Type 2 diabetes mellitus without complications: Secondary | ICD-10-CM | POA: Diagnosis not present

## 2013-07-19 DIAGNOSIS — M6281 Muscle weakness (generalized): Secondary | ICD-10-CM | POA: Diagnosis not present

## 2013-07-19 DIAGNOSIS — Z48812 Encounter for surgical aftercare following surgery on the circulatory system: Secondary | ICD-10-CM | POA: Diagnosis not present

## 2013-07-19 DIAGNOSIS — R269 Unspecified abnormalities of gait and mobility: Secondary | ICD-10-CM | POA: Diagnosis not present

## 2013-07-19 DIAGNOSIS — I2510 Atherosclerotic heart disease of native coronary artery without angina pectoris: Secondary | ICD-10-CM | POA: Diagnosis not present

## 2013-07-23 DIAGNOSIS — M6281 Muscle weakness (generalized): Secondary | ICD-10-CM | POA: Diagnosis not present

## 2013-07-23 DIAGNOSIS — E119 Type 2 diabetes mellitus without complications: Secondary | ICD-10-CM | POA: Diagnosis not present

## 2013-07-23 DIAGNOSIS — Z48812 Encounter for surgical aftercare following surgery on the circulatory system: Secondary | ICD-10-CM | POA: Diagnosis not present

## 2013-07-23 DIAGNOSIS — I2510 Atherosclerotic heart disease of native coronary artery without angina pectoris: Secondary | ICD-10-CM | POA: Diagnosis not present

## 2013-07-23 DIAGNOSIS — R269 Unspecified abnormalities of gait and mobility: Secondary | ICD-10-CM | POA: Diagnosis not present

## 2013-07-24 DIAGNOSIS — R609 Edema, unspecified: Secondary | ICD-10-CM | POA: Diagnosis not present

## 2013-07-24 DIAGNOSIS — I2510 Atherosclerotic heart disease of native coronary artery without angina pectoris: Secondary | ICD-10-CM | POA: Diagnosis not present

## 2013-07-24 DIAGNOSIS — I059 Rheumatic mitral valve disease, unspecified: Secondary | ICD-10-CM | POA: Diagnosis not present

## 2013-07-24 DIAGNOSIS — I119 Hypertensive heart disease without heart failure: Secondary | ICD-10-CM | POA: Diagnosis not present

## 2013-07-25 DIAGNOSIS — M6281 Muscle weakness (generalized): Secondary | ICD-10-CM | POA: Diagnosis not present

## 2013-07-25 DIAGNOSIS — I2510 Atherosclerotic heart disease of native coronary artery without angina pectoris: Secondary | ICD-10-CM | POA: Diagnosis not present

## 2013-07-25 DIAGNOSIS — R269 Unspecified abnormalities of gait and mobility: Secondary | ICD-10-CM | POA: Diagnosis not present

## 2013-07-25 DIAGNOSIS — Z48812 Encounter for surgical aftercare following surgery on the circulatory system: Secondary | ICD-10-CM | POA: Diagnosis not present

## 2013-07-25 DIAGNOSIS — E119 Type 2 diabetes mellitus without complications: Secondary | ICD-10-CM | POA: Diagnosis not present

## 2013-07-26 ENCOUNTER — Other Ambulatory Visit: Payer: Self-pay | Admitting: *Deleted

## 2013-07-26 DIAGNOSIS — I2510 Atherosclerotic heart disease of native coronary artery without angina pectoris: Secondary | ICD-10-CM

## 2013-07-31 ENCOUNTER — Ambulatory Visit
Admission: RE | Admit: 2013-07-31 | Discharge: 2013-07-31 | Disposition: A | Discharge status: Home / Self Care | Payer: Medicare Other | Source: Ambulatory Visit | Attending: Cardiothoracic Surgery | Admitting: Cardiothoracic Surgery

## 2013-07-31 ENCOUNTER — Encounter: Payer: Self-pay | Admitting: Cardiothoracic Surgery

## 2013-07-31 ENCOUNTER — Ambulatory Visit (INDEPENDENT_AMBULATORY_CARE_PROVIDER_SITE_OTHER): Payer: Medicare Other | Admitting: Cardiothoracic Surgery

## 2013-07-31 VITALS — BP 111/47 | HR 67 | Resp 16 | Ht 63.0 in | Wt 152.0 lb

## 2013-07-31 DIAGNOSIS — Z9889 Other specified postprocedural states: Secondary | ICD-10-CM

## 2013-07-31 DIAGNOSIS — J90 Pleural effusion, not elsewhere classified: Secondary | ICD-10-CM

## 2013-07-31 DIAGNOSIS — Z951 Presence of aortocoronary bypass graft: Secondary | ICD-10-CM

## 2013-07-31 DIAGNOSIS — I2510 Atherosclerotic heart disease of native coronary artery without angina pectoris: Secondary | ICD-10-CM

## 2013-07-31 NOTE — Progress Notes (Signed)
PCP is Eulas Post, MD Referring Provider is Miguel Aschoff, MD  Chief Complaint  Patient presents with  . Routine Post Op    3 week f/u the CXR...breathing better...OT continues therapy in the home    HPI: Patient underwent CABG and mitral valve repair 2 months ago to treat ischemic mitral regurgitation and three-vessel CAD with EF 30%. She is been followed for a left pleural effusion. She returns today with chest x-ray for review. She is been on amiodarone for transient postoperative atrial fibrillation maintaining sinus rhythm for the past several weeks. She denies symptoms of heart failure or angina. She is improving her strength and energy. All surgical incisions are healing well   Past Medical History  Diagnosis Date  . Hypertension   . Diabetes mellitus   . Hyperlipidemia   . Arthritis   . Glaucoma   . Shortness of breath     with exertion    Past Surgical History  Procedure Laterality Date  . Hip surgery      right  . Joint replacement    . Cholecystectomy    . Tonsillectomy    . Cardiac catheterization  04/22/13  . Coronary artery bypass graft N/A 05/31/2013    Procedure: Coronary Artery Bypass Grafting Times Three Using Left Internal Mammary Artery and Right Saphenous Leg Vein Harvested Endoscopically;  Surgeon: Ivin Poot, MD;  Location: [facility];  Service: Open Heart Surgery;  Laterality: N/A;  . Mitral valve repair N/A 05/31/2013    Procedure: MITRAL VALVE REPAIR (MVR);  Surgeon: Ivin Poot, MD;  Location: [facility];  Service: Open Heart Surgery;  Laterality: N/A;  . Intraoperative transesophageal echocardiogram N/A 05/31/2013    Procedure: INTRAOPERATIVE TRANSESOPHAGEAL ECHOCARDIOGRAM;  Surgeon: Ivin Poot, MD;  Location: [facility];  Service: Open Heart Surgery;  Laterality: N/A;    Family History  Problem Relation Age of Onset  . Diabetes Sister     Social History History  Substance Use Topics  . Smoking status: Never Smoker   . Smokeless  tobacco: Never Used  . Alcohol Use: No    Current Outpatient Prescriptions  Medication Sig Dispense Refill  . aspirin EC 325 MG EC tablet Take 1 tablet (325 mg total) by mouth daily.  30 tablet  0  . atorvastatin (LIPITOR) 20 MG tablet Take 1 tablet (20 mg total) by mouth daily at 6 PM.  30 tablet  3  . brimonidine (ALPHAGAN P) 0.1 % SOLN Place 1 drop into both eyes every 8 (eight) hours.      . dorzolamide (TRUSOPT) 2 % ophthalmic solution Place 1 drop into both eyes 3 (three) times daily.      . insulin detemir (LEVEMIR) 100 UNIT/ML injection Inject 20 Units into the skin daily before supper.      Marland Kitchen lisinopril (PRINIVIL,ZESTRIL) 2.5 MG tablet Take 1 tablet (2.5 mg total) by mouth daily.  30 tablet  3  . metoprolol tartrate (LOPRESSOR) 25 MG tablet Take 0.5 tablets (12.5 mg total) by mouth 2 (two) times daily.  30 tablet  3  . pioglitazone (ACTOS) 45 MG tablet Take 45 mg by mouth daily.      . traMADol (ULTRAM) 50 MG tablet Take 1 tablet (50 mg total) by mouth every 6 (six) hours as needed.  30 tablet  0  . travoprost, benzalkonium, (TRAVATAN) 0.004 % ophthalmic solution Place 1 drop into both eyes at bedtime.      Marland Kitchen amiodarone (PACERONE) 200 MG tablet Take 1 tablet (  200 mg total) by mouth daily.  30 tablet  3   No current facility-administered medications for this visit.    No Known Allergies  Review of Systems no fever, appetite good, sleeping satisfactory  BP 111/47  Pulse 67  Resp 16  Ht 5\' 3"  (1.6 m)  Wt 152 lb (68.947 kg)  BMI 26.93 kg/m2  SpO2 97% Physical Exam Alert and comfortable Lungs clear bilaterally Sternal incision well-healed Heart rhythm regular without murmur Pedal edema trace bilaterally  Diagnostic Tests: Chest x-ray shows resolution of left pleural effusion  Impression: Doing well mouth 2 months after combined CABG and mitral valve repair in this 79 year old female. She is back at home receiving home occupational therapy  Plan: She's not ready to  drive yet We will discontinue the amiodarone as she is maintained sinus rhythm The metoprolol be reduced to 12.5 mg daily because of slow heart rate She'll continue Lasix 40 mg a day She'll return in 4 weeks with a chest x-ray to make sure the left pleural effusion does not recur She is encouraged take at 20 minute walk daily.

## 2013-08-01 DIAGNOSIS — E119 Type 2 diabetes mellitus without complications: Secondary | ICD-10-CM | POA: Diagnosis not present

## 2013-08-01 DIAGNOSIS — Z48812 Encounter for surgical aftercare following surgery on the circulatory system: Secondary | ICD-10-CM | POA: Diagnosis not present

## 2013-08-01 DIAGNOSIS — I2510 Atherosclerotic heart disease of native coronary artery without angina pectoris: Secondary | ICD-10-CM | POA: Diagnosis not present

## 2013-08-01 DIAGNOSIS — R269 Unspecified abnormalities of gait and mobility: Secondary | ICD-10-CM | POA: Diagnosis not present

## 2013-08-01 DIAGNOSIS — M6281 Muscle weakness (generalized): Secondary | ICD-10-CM | POA: Diagnosis not present

## 2013-08-05 DIAGNOSIS — Z48812 Encounter for surgical aftercare following surgery on the circulatory system: Secondary | ICD-10-CM | POA: Diagnosis not present

## 2013-08-05 DIAGNOSIS — M6281 Muscle weakness (generalized): Secondary | ICD-10-CM | POA: Diagnosis not present

## 2013-08-05 DIAGNOSIS — E119 Type 2 diabetes mellitus without complications: Secondary | ICD-10-CM | POA: Diagnosis not present

## 2013-08-05 DIAGNOSIS — I2510 Atherosclerotic heart disease of native coronary artery without angina pectoris: Secondary | ICD-10-CM | POA: Diagnosis not present

## 2013-08-05 DIAGNOSIS — R269 Unspecified abnormalities of gait and mobility: Secondary | ICD-10-CM | POA: Diagnosis not present

## 2013-08-08 DIAGNOSIS — E119 Type 2 diabetes mellitus without complications: Secondary | ICD-10-CM | POA: Diagnosis not present

## 2013-08-08 DIAGNOSIS — Z48812 Encounter for surgical aftercare following surgery on the circulatory system: Secondary | ICD-10-CM | POA: Diagnosis not present

## 2013-08-08 DIAGNOSIS — M6281 Muscle weakness (generalized): Secondary | ICD-10-CM | POA: Diagnosis not present

## 2013-08-08 DIAGNOSIS — R269 Unspecified abnormalities of gait and mobility: Secondary | ICD-10-CM | POA: Diagnosis not present

## 2013-08-08 DIAGNOSIS — I2510 Atherosclerotic heart disease of native coronary artery without angina pectoris: Secondary | ICD-10-CM | POA: Diagnosis not present

## 2013-08-13 ENCOUNTER — Other Ambulatory Visit: Payer: Self-pay | Admitting: *Deleted

## 2013-08-13 DIAGNOSIS — J90 Pleural effusion, not elsewhere classified: Secondary | ICD-10-CM

## 2013-08-13 MED ORDER — FUROSEMIDE 40 MG PO TABS: 40.0000 mg | ORAL_TABLET | Freq: Every day | ORAL | Status: DC

## 2013-08-20 DIAGNOSIS — E119 Type 2 diabetes mellitus without complications: Secondary | ICD-10-CM | POA: Diagnosis not present

## 2013-08-20 DIAGNOSIS — Z23 Encounter for immunization: Secondary | ICD-10-CM | POA: Diagnosis not present

## 2013-08-20 DIAGNOSIS — I2510 Atherosclerotic heart disease of native coronary artery without angina pectoris: Secondary | ICD-10-CM | POA: Diagnosis not present

## 2013-08-20 DIAGNOSIS — J069 Acute upper respiratory infection, unspecified: Secondary | ICD-10-CM | POA: Diagnosis not present

## 2013-08-26 ENCOUNTER — Other Ambulatory Visit: Payer: Self-pay | Admitting: *Deleted

## 2013-08-26 DIAGNOSIS — I2510 Atherosclerotic heart disease of native coronary artery without angina pectoris: Secondary | ICD-10-CM

## 2013-08-27 DIAGNOSIS — R269 Unspecified abnormalities of gait and mobility: Secondary | ICD-10-CM | POA: Diagnosis not present

## 2013-08-27 DIAGNOSIS — Z48812 Encounter for surgical aftercare following surgery on the circulatory system: Secondary | ICD-10-CM | POA: Diagnosis not present

## 2013-08-27 DIAGNOSIS — M6281 Muscle weakness (generalized): Secondary | ICD-10-CM | POA: Diagnosis not present

## 2013-08-27 DIAGNOSIS — I2510 Atherosclerotic heart disease of native coronary artery without angina pectoris: Secondary | ICD-10-CM | POA: Diagnosis not present

## 2013-08-27 DIAGNOSIS — E119 Type 2 diabetes mellitus without complications: Secondary | ICD-10-CM | POA: Diagnosis not present

## 2013-08-28 ENCOUNTER — Ambulatory Visit: Payer: Medicare Other | Admitting: Cardiothoracic Surgery

## 2013-08-29 DIAGNOSIS — Z48812 Encounter for surgical aftercare following surgery on the circulatory system: Secondary | ICD-10-CM | POA: Diagnosis not present

## 2013-08-29 DIAGNOSIS — I2510 Atherosclerotic heart disease of native coronary artery without angina pectoris: Secondary | ICD-10-CM | POA: Diagnosis not present

## 2013-08-29 DIAGNOSIS — E119 Type 2 diabetes mellitus without complications: Secondary | ICD-10-CM | POA: Diagnosis not present

## 2013-08-29 DIAGNOSIS — M6281 Muscle weakness (generalized): Secondary | ICD-10-CM | POA: Diagnosis not present

## 2013-08-29 DIAGNOSIS — R269 Unspecified abnormalities of gait and mobility: Secondary | ICD-10-CM | POA: Diagnosis not present

## 2013-09-03 DIAGNOSIS — I2510 Atherosclerotic heart disease of native coronary artery without angina pectoris: Secondary | ICD-10-CM | POA: Diagnosis not present

## 2013-09-03 DIAGNOSIS — R269 Unspecified abnormalities of gait and mobility: Secondary | ICD-10-CM | POA: Diagnosis not present

## 2013-09-03 DIAGNOSIS — E119 Type 2 diabetes mellitus without complications: Secondary | ICD-10-CM | POA: Diagnosis not present

## 2013-09-03 DIAGNOSIS — Z48812 Encounter for surgical aftercare following surgery on the circulatory system: Secondary | ICD-10-CM | POA: Diagnosis not present

## 2013-09-03 DIAGNOSIS — M6281 Muscle weakness (generalized): Secondary | ICD-10-CM | POA: Diagnosis not present

## 2013-09-11 DIAGNOSIS — R269 Unspecified abnormalities of gait and mobility: Secondary | ICD-10-CM | POA: Diagnosis not present

## 2013-09-11 DIAGNOSIS — I2510 Atherosclerotic heart disease of native coronary artery without angina pectoris: Secondary | ICD-10-CM | POA: Diagnosis not present

## 2013-09-11 DIAGNOSIS — Z48812 Encounter for surgical aftercare following surgery on the circulatory system: Secondary | ICD-10-CM | POA: Diagnosis not present

## 2013-09-11 DIAGNOSIS — I059 Rheumatic mitral valve disease, unspecified: Secondary | ICD-10-CM | POA: Diagnosis not present

## 2013-09-11 DIAGNOSIS — I25810 Atherosclerosis of coronary artery bypass graft(s) without angina pectoris: Secondary | ICD-10-CM | POA: Diagnosis not present

## 2013-09-11 DIAGNOSIS — E119 Type 2 diabetes mellitus without complications: Secondary | ICD-10-CM | POA: Diagnosis not present

## 2013-09-11 DIAGNOSIS — E785 Hyperlipidemia, unspecified: Secondary | ICD-10-CM | POA: Diagnosis not present

## 2013-09-11 DIAGNOSIS — M6281 Muscle weakness (generalized): Secondary | ICD-10-CM | POA: Diagnosis not present

## 2013-09-11 DIAGNOSIS — I10 Essential (primary) hypertension: Secondary | ICD-10-CM | POA: Diagnosis not present

## 2013-09-12 ENCOUNTER — Other Ambulatory Visit: Payer: Self-pay | Admitting: Cardiothoracic Surgery

## 2013-09-12 DIAGNOSIS — I2510 Atherosclerotic heart disease of native coronary artery without angina pectoris: Secondary | ICD-10-CM | POA: Diagnosis not present

## 2013-09-12 DIAGNOSIS — Z48812 Encounter for surgical aftercare following surgery on the circulatory system: Secondary | ICD-10-CM | POA: Diagnosis not present

## 2013-09-12 DIAGNOSIS — E119 Type 2 diabetes mellitus without complications: Secondary | ICD-10-CM | POA: Diagnosis not present

## 2013-09-12 DIAGNOSIS — M6281 Muscle weakness (generalized): Secondary | ICD-10-CM | POA: Diagnosis not present

## 2013-09-12 DIAGNOSIS — R269 Unspecified abnormalities of gait and mobility: Secondary | ICD-10-CM | POA: Diagnosis not present

## 2013-09-20 ENCOUNTER — Encounter (HOSPITAL_COMMUNITY): Payer: Self-pay | Admitting: Cardiothoracic Surgery

## 2013-09-20 NOTE — OR Nursing (Signed)
Corrected the Implant to the correct model number that was used.

## 2013-09-26 DIAGNOSIS — H409 Unspecified glaucoma: Secondary | ICD-10-CM | POA: Diagnosis not present

## 2013-09-26 DIAGNOSIS — H4011X0 Primary open-angle glaucoma, stage unspecified: Secondary | ICD-10-CM | POA: Diagnosis not present

## 2013-10-11 DIAGNOSIS — B029 Zoster without complications: Secondary | ICD-10-CM | POA: Diagnosis not present

## 2013-10-11 DIAGNOSIS — R030 Elevated blood-pressure reading, without diagnosis of hypertension: Secondary | ICD-10-CM | POA: Diagnosis not present

## 2013-10-14 DIAGNOSIS — B029 Zoster without complications: Secondary | ICD-10-CM | POA: Diagnosis not present

## 2013-10-14 DIAGNOSIS — I10 Essential (primary) hypertension: Secondary | ICD-10-CM | POA: Diagnosis not present

## 2013-10-14 DIAGNOSIS — E7800 Pure hypercholesterolemia, unspecified: Secondary | ICD-10-CM | POA: Diagnosis not present

## 2013-10-14 DIAGNOSIS — I2510 Atherosclerotic heart disease of native coronary artery without angina pectoris: Secondary | ICD-10-CM | POA: Diagnosis not present

## 2013-10-16 DIAGNOSIS — B029 Zoster without complications: Secondary | ICD-10-CM | POA: Diagnosis not present

## 2013-10-22 DIAGNOSIS — B029 Zoster without complications: Secondary | ICD-10-CM | POA: Diagnosis not present

## 2013-10-31 DIAGNOSIS — B029 Zoster without complications: Secondary | ICD-10-CM | POA: Diagnosis not present

## 2013-11-04 ENCOUNTER — Other Ambulatory Visit: Payer: Self-pay | Admitting: Physician Assistant

## 2013-11-07 DIAGNOSIS — B0229 Other postherpetic nervous system involvement: Secondary | ICD-10-CM | POA: Diagnosis not present

## 2013-11-07 DIAGNOSIS — I10 Essential (primary) hypertension: Secondary | ICD-10-CM | POA: Diagnosis not present

## 2013-11-07 DIAGNOSIS — IMO0001 Reserved for inherently not codable concepts without codable children: Secondary | ICD-10-CM | POA: Diagnosis not present

## 2013-11-07 DIAGNOSIS — I2510 Atherosclerotic heart disease of native coronary artery without angina pectoris: Secondary | ICD-10-CM | POA: Diagnosis not present

## 2013-11-08 ENCOUNTER — Other Ambulatory Visit: Payer: Self-pay | Admitting: Physician Assistant

## 2013-11-13 ENCOUNTER — Other Ambulatory Visit: Payer: Self-pay | Admitting: Physician Assistant

## 2013-11-27 DIAGNOSIS — E782 Mixed hyperlipidemia: Secondary | ICD-10-CM | POA: Diagnosis not present

## 2013-11-27 DIAGNOSIS — I059 Rheumatic mitral valve disease, unspecified: Secondary | ICD-10-CM | POA: Diagnosis not present

## 2013-11-27 DIAGNOSIS — I5022 Chronic systolic (congestive) heart failure: Secondary | ICD-10-CM | POA: Diagnosis not present

## 2013-11-27 DIAGNOSIS — R0602 Shortness of breath: Secondary | ICD-10-CM | POA: Diagnosis not present

## 2013-12-09 ENCOUNTER — Other Ambulatory Visit: Payer: Self-pay | Admitting: Physician Assistant

## 2013-12-09 DIAGNOSIS — R9389 Abnormal findings on diagnostic imaging of other specified body structures: Secondary | ICD-10-CM | POA: Diagnosis not present

## 2013-12-09 DIAGNOSIS — E782 Mixed hyperlipidemia: Secondary | ICD-10-CM | POA: Diagnosis not present

## 2013-12-09 DIAGNOSIS — E119 Type 2 diabetes mellitus without complications: Secondary | ICD-10-CM | POA: Diagnosis not present

## 2013-12-09 DIAGNOSIS — I119 Hypertensive heart disease without heart failure: Secondary | ICD-10-CM | POA: Diagnosis not present

## 2013-12-09 DIAGNOSIS — R0602 Shortness of breath: Secondary | ICD-10-CM | POA: Diagnosis not present

## 2014-01-18 IMAGING — CR DG CHEST 2V
2 series · 2 of 2 positions shown · non-contrast
Comparison: None.

CLINICAL DATA: Coronary artery disease.

EXAM:
CHEST  2 VIEW

[w chest pa]
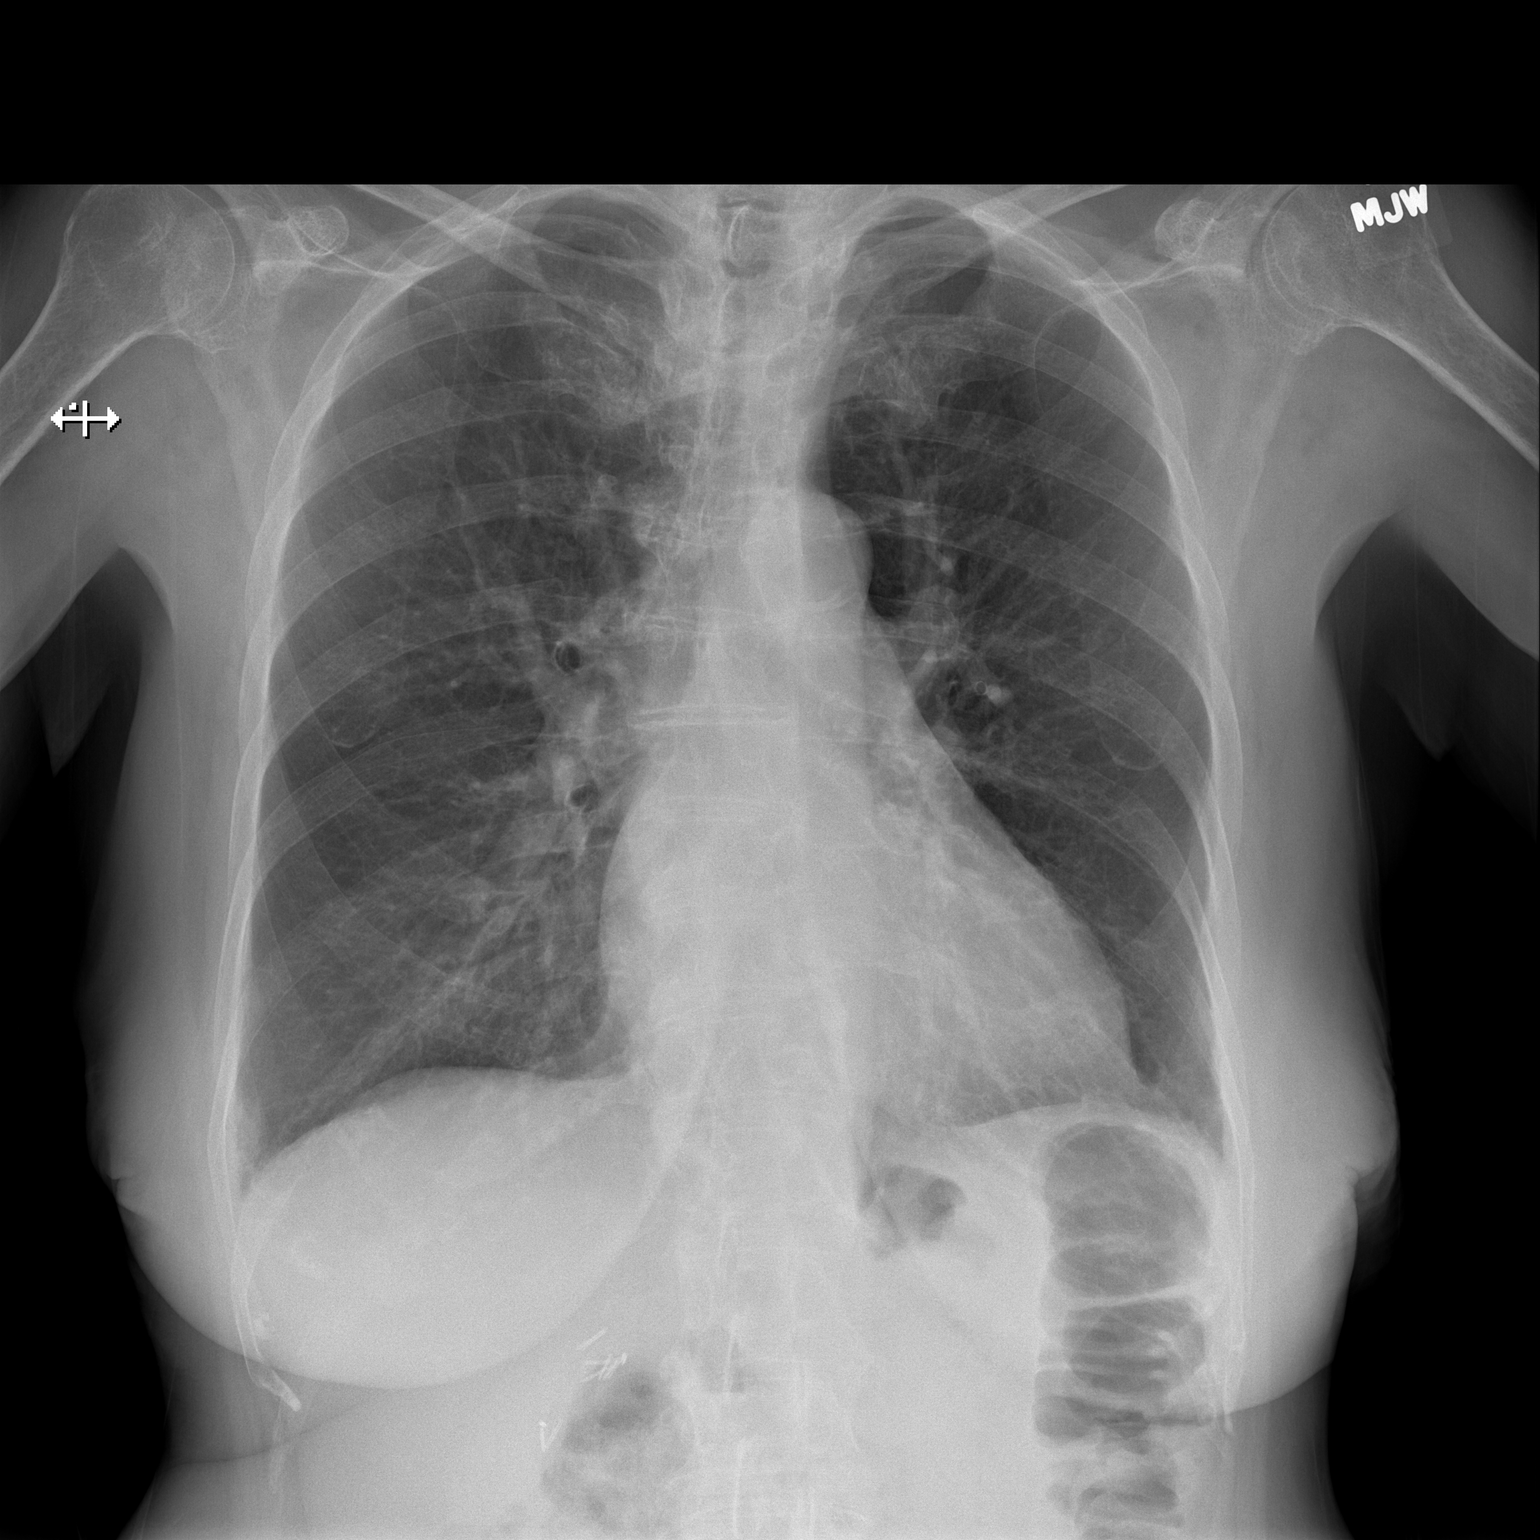

[w chest lat]
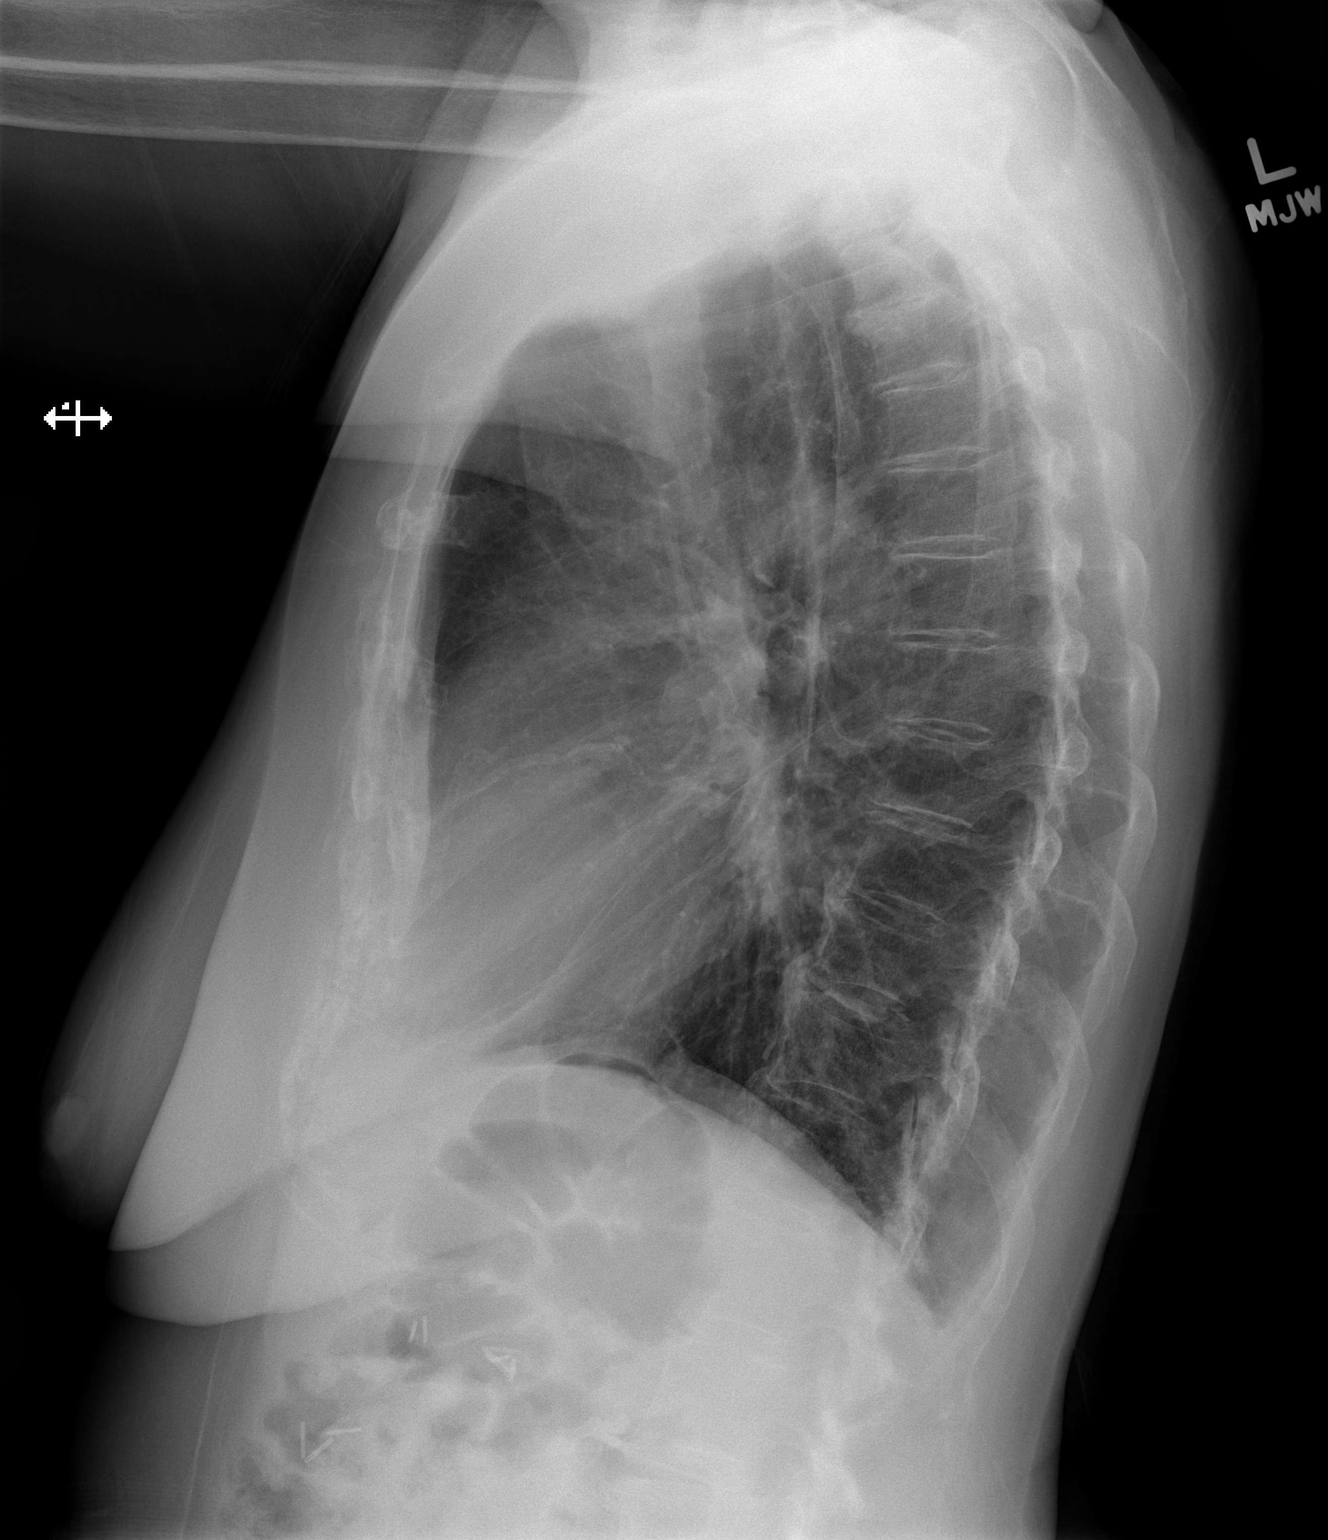

[2 of 2 positions shown; findings below may reference images not displayed]

FINDINGS: The heart size and mediastinal contours are within normal limits.
Both lungs are clear. The visualized skeletal structures are
unremarkable.
IMPRESSION: No active cardiopulmonary disease.

## 2014-01-30 DIAGNOSIS — IMO0001 Reserved for inherently not codable concepts without codable children: Secondary | ICD-10-CM | POA: Diagnosis not present

## 2014-01-30 DIAGNOSIS — E11329 Type 2 diabetes mellitus with mild nonproliferative diabetic retinopathy without macular edema: Secondary | ICD-10-CM | POA: Diagnosis not present

## 2014-03-11 DIAGNOSIS — I10 Essential (primary) hypertension: Secondary | ICD-10-CM | POA: Diagnosis not present

## 2014-03-11 DIAGNOSIS — Z23 Encounter for immunization: Secondary | ICD-10-CM | POA: Diagnosis not present

## 2014-03-11 DIAGNOSIS — E119 Type 2 diabetes mellitus without complications: Secondary | ICD-10-CM | POA: Diagnosis not present

## 2014-03-11 DIAGNOSIS — D649 Anemia, unspecified: Secondary | ICD-10-CM | POA: Diagnosis not present

## 2014-03-11 DIAGNOSIS — I2510 Atherosclerotic heart disease of native coronary artery without angina pectoris: Secondary | ICD-10-CM | POA: Diagnosis not present

## 2014-03-11 DIAGNOSIS — E1129 Type 2 diabetes mellitus with other diabetic kidney complication: Secondary | ICD-10-CM | POA: Diagnosis not present

## 2014-03-23 IMAGING — CR DG CHEST 2V
2 series · 2 of 2 positions shown · non-contrast
Comparison: 07/03/2013

CLINICAL DATA: Follow up CABG x2 weeks, dyspnea

EXAM:
CHEST  2 VIEW

[w chest pa]
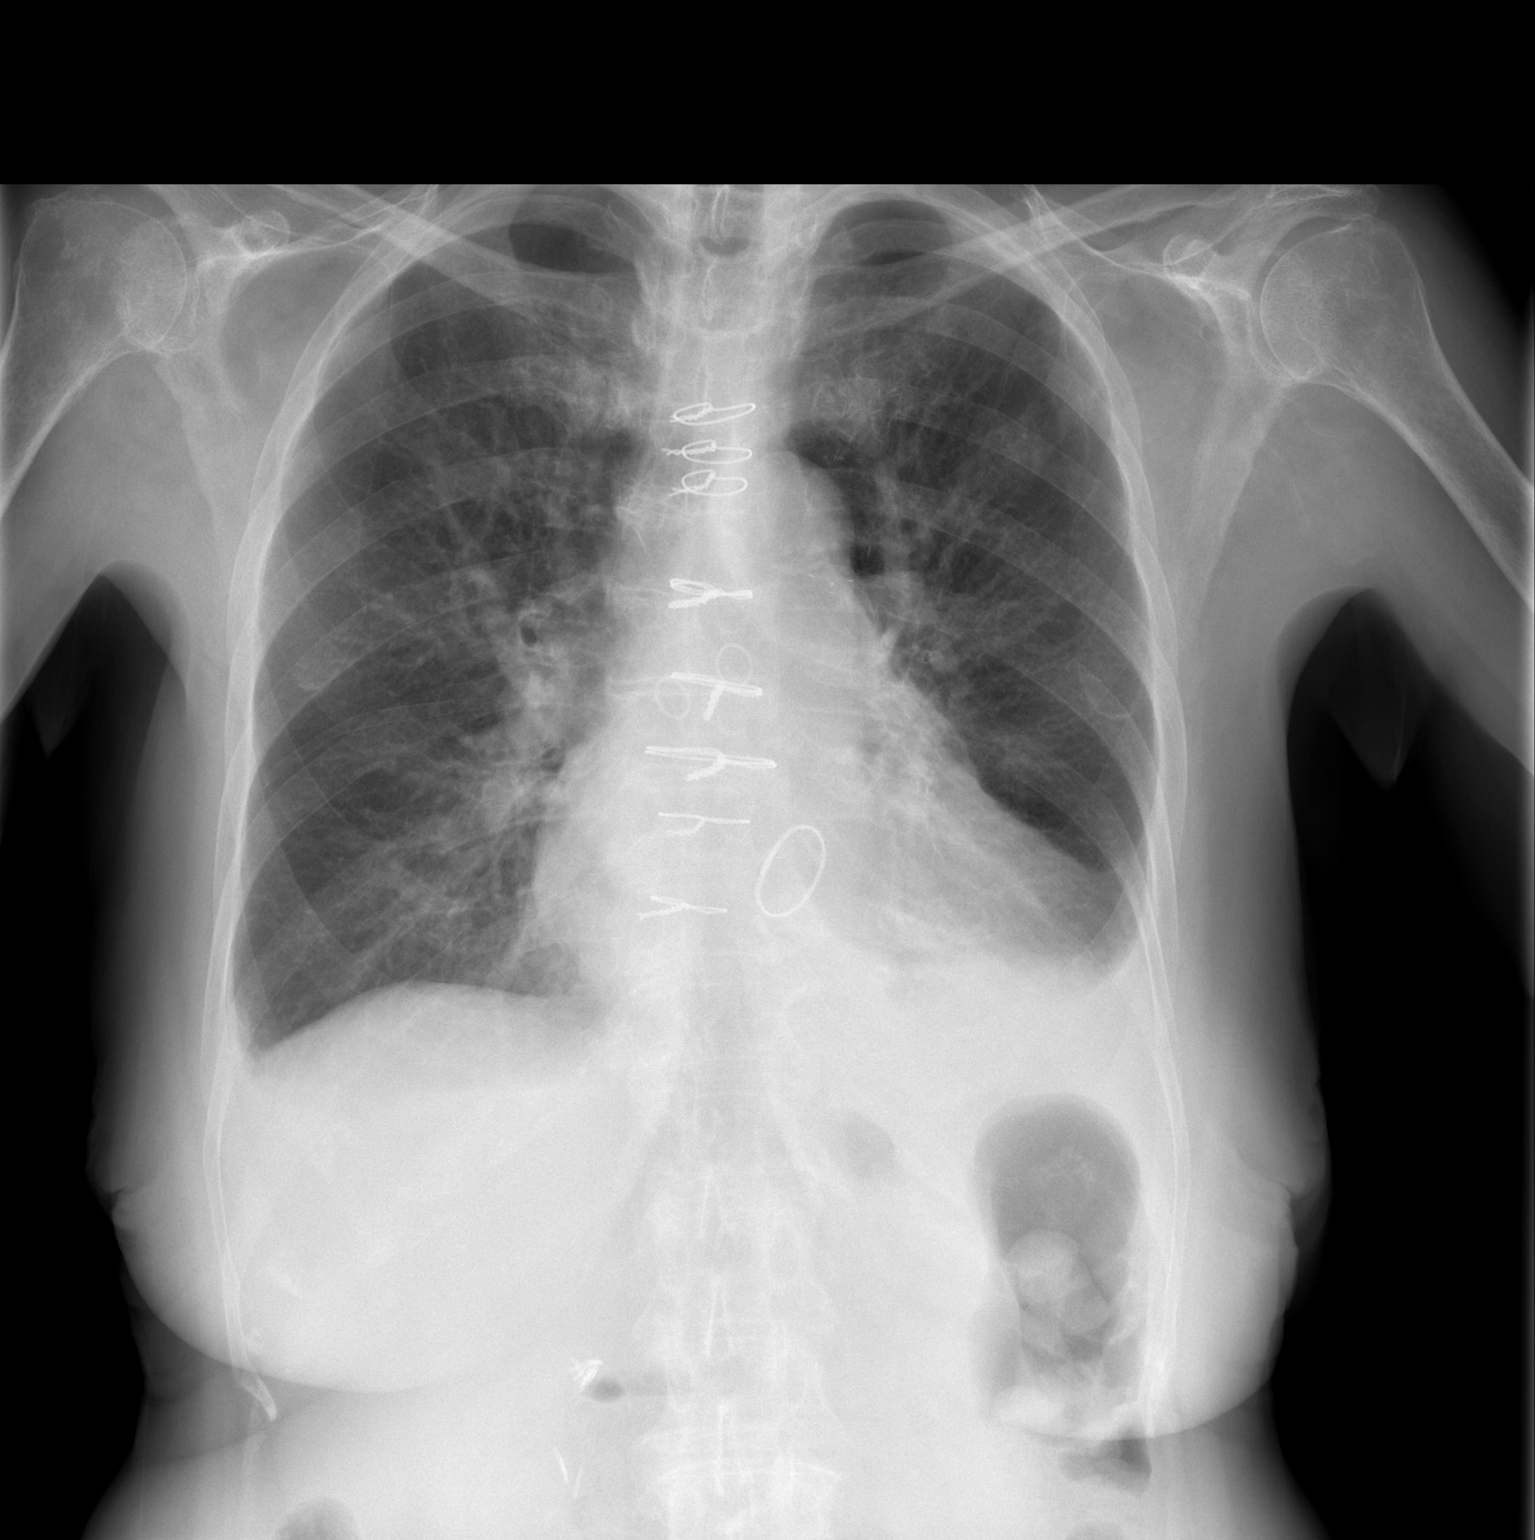

[w chest lat]
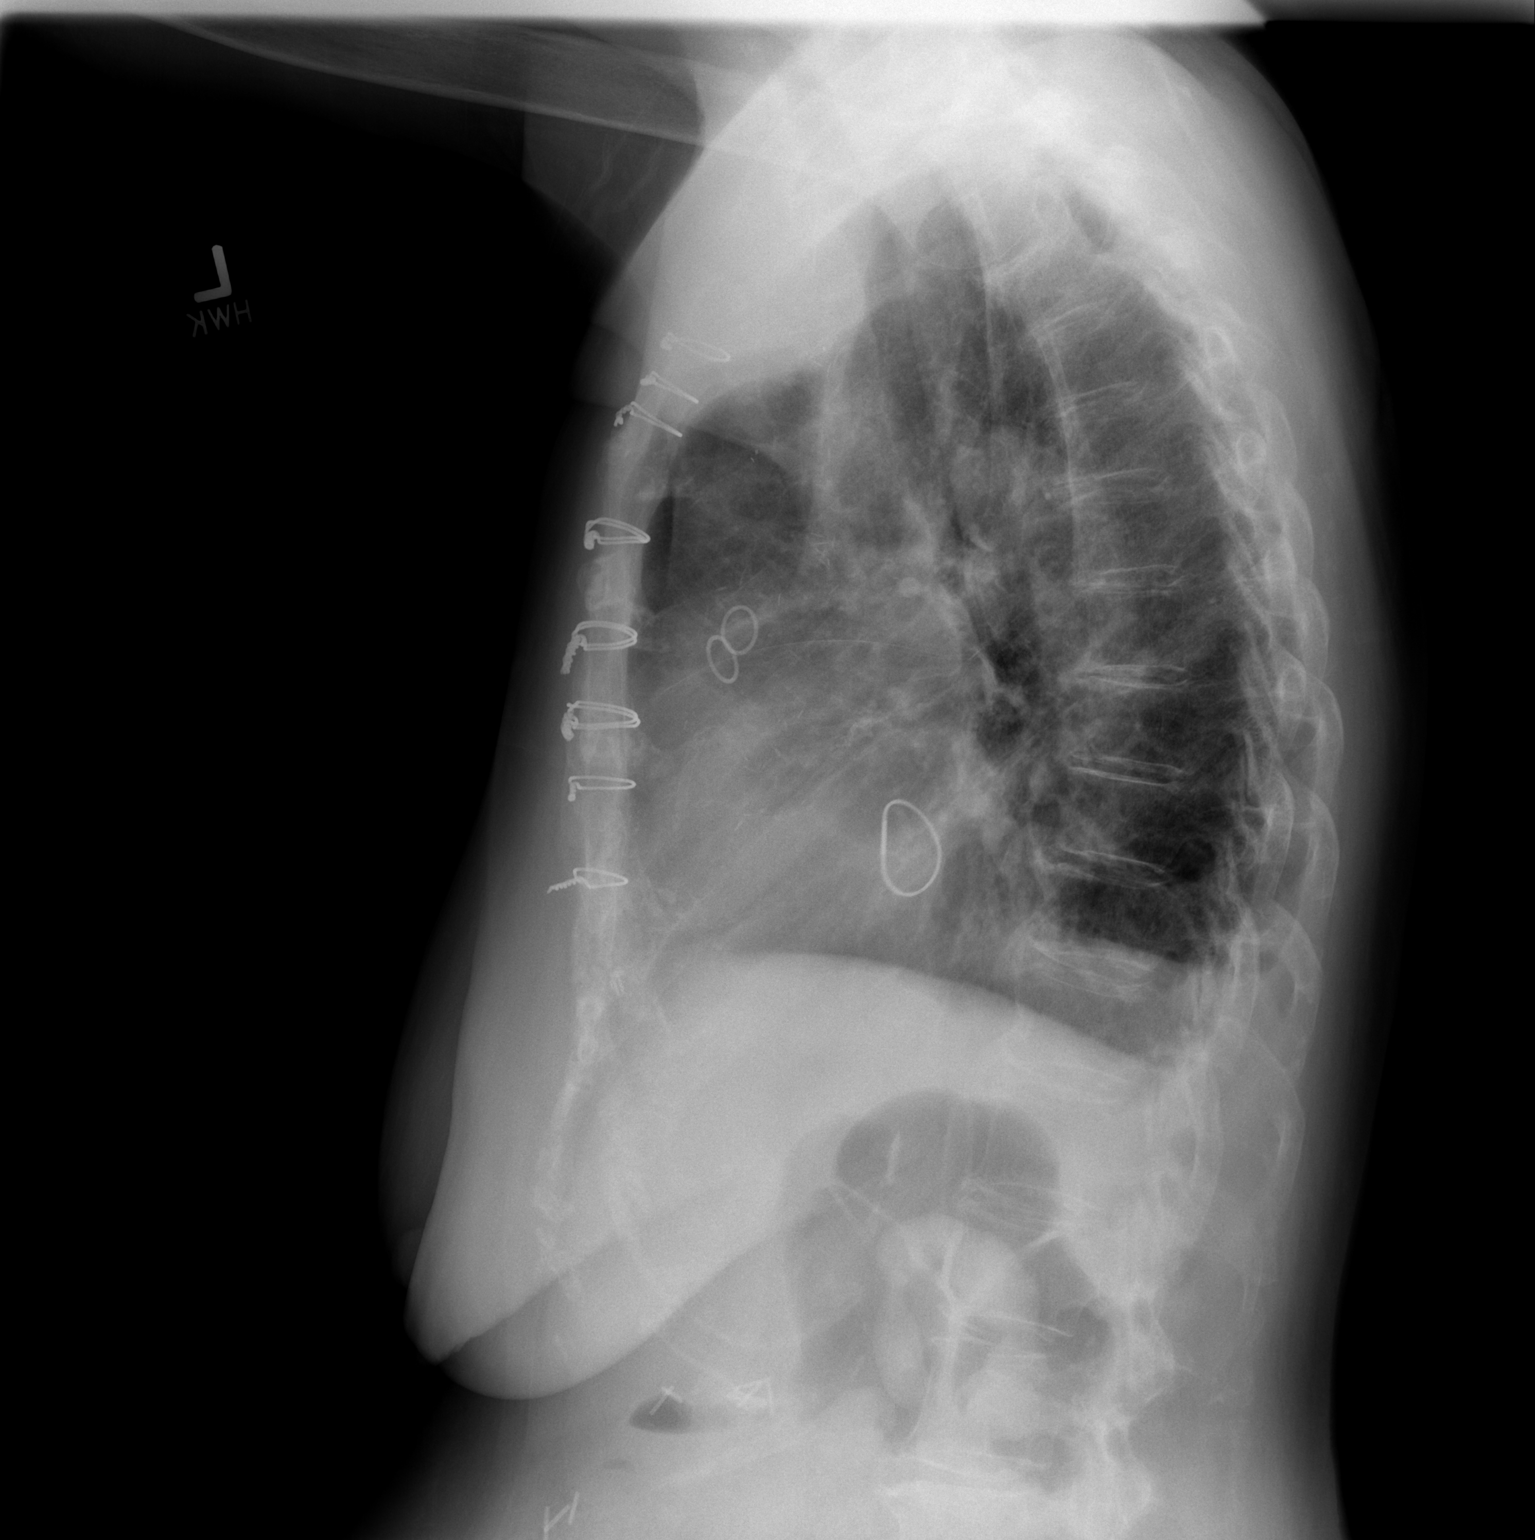

[2 of 2 positions shown; findings below may reference images not displayed]

FINDINGS: The cardiac silhouette is enlarged. The patient is status post
coronary artery bypass grafting and mitral valve replacement. There
has been interval decreased size of the patient's left pleural
effusion a small residua is appreciated. No new focal regions of
consolidation or new focal infiltrates appreciated. The osseous
structures are unremarkable. There is flattening of the
hemidiaphragms.
IMPRESSION: 1. Interval decreased size of the patient's left pleural effusion a
small residua is appreciated.
2. Again near complete resolution of the patient's right pleural
effusion
3. The chest radiograph is otherwise stable.

## 2014-08-05 DIAGNOSIS — H20042 Secondary noninfectious iridocyclitis, left eye: Secondary | ICD-10-CM | POA: Diagnosis not present

## 2014-09-10 DIAGNOSIS — H4011X2 Primary open-angle glaucoma, moderate stage: Secondary | ICD-10-CM | POA: Diagnosis not present

## 2014-12-22 DIAGNOSIS — H4011X3 Primary open-angle glaucoma, severe stage: Secondary | ICD-10-CM | POA: Diagnosis not present

## 2014-12-23 DIAGNOSIS — D649 Anemia, unspecified: Secondary | ICD-10-CM | POA: Diagnosis not present

## 2014-12-23 DIAGNOSIS — I2510 Atherosclerotic heart disease of native coronary artery without angina pectoris: Secondary | ICD-10-CM | POA: Diagnosis not present

## 2014-12-23 DIAGNOSIS — E785 Hyperlipidemia, unspecified: Secondary | ICD-10-CM | POA: Diagnosis not present

## 2014-12-23 DIAGNOSIS — E119 Type 2 diabetes mellitus without complications: Secondary | ICD-10-CM | POA: Diagnosis not present

## 2014-12-23 DIAGNOSIS — I10 Essential (primary) hypertension: Secondary | ICD-10-CM | POA: Diagnosis not present

## 2014-12-23 LAB — HEPATIC FUNCTION PANEL
ALT: 98 U/L — AB (ref 7–35)
AST: 0 U/L — AB (ref 13–35)
Alkaline Phosphatase: 404 U/L — AB (ref 25–125)
BILIRUBIN, TOTAL: 0.5 mg/dL

## 2014-12-23 LAB — BASIC METABOLIC PANEL
BUN: 32 mg/dL — AB (ref 4–21)
CREATININE: 1.5 mg/dL — AB (ref 0.5–1.1)
GLUCOSE: 123 mg/dL
POTASSIUM: 5.1 mmol/L (ref 3.4–5.3)
SODIUM: 140 mmol/L (ref 137–147)

## 2014-12-23 LAB — LIPID PANEL
Cholesterol: 138 mg/dL (ref 0–200)
HDL: 46 mg/dL (ref 35–70)
LDL Cholesterol: 71 mg/dL
LDL/HDL RATIO: 1.5
Triglycerides: 106 mg/dL (ref 40–160)

## 2014-12-23 LAB — TSH: TSH: 1.19 u[IU]/mL (ref 0.41–5.90)

## 2014-12-23 LAB — HEMOGLOBIN A1C: HEMOGLOBIN A1C: 6.5 % — AB (ref 4.0–6.0)

## 2014-12-23 LAB — CBC AND DIFFERENTIAL
HCT: 32 % — AB (ref 36–46)
Hemoglobin: 10.5 g/dL — AB (ref 12.0–16.0)
NEUTROS ABS: 34 /uL
Platelets: 221 10*3/uL (ref 150–399)
WBC: 8.8 10^3/mL

## 2014-12-24 DIAGNOSIS — Z96641 Presence of right artificial hip joint: Secondary | ICD-10-CM | POA: Diagnosis not present

## 2015-01-22 ENCOUNTER — Other Ambulatory Visit: Payer: Self-pay | Admitting: Family Medicine

## 2015-01-23 ENCOUNTER — Other Ambulatory Visit: Payer: Self-pay

## 2015-01-23 DIAGNOSIS — E118 Type 2 diabetes mellitus with unspecified complications: Secondary | ICD-10-CM

## 2015-01-23 MED ORDER — INSULIN DETEMIR 100 UNIT/ML ~~LOC~~ SOLN: 20.0000 [IU] | Freq: Every day | SUBCUTANEOUS | Status: DC

## 2015-01-23 NOTE — Telephone Encounter (Signed)
Patient called stating that she needs refills. RX sent in-aa

## 2015-03-03 ENCOUNTER — Other Ambulatory Visit: Payer: Self-pay | Admitting: Family Medicine

## 2015-03-10 ENCOUNTER — Other Ambulatory Visit: Payer: Self-pay | Admitting: Family Medicine

## 2015-03-31 ENCOUNTER — Other Ambulatory Visit: Payer: Self-pay | Admitting: Family Medicine

## 2015-04-17 DIAGNOSIS — D638 Anemia in other chronic diseases classified elsewhere: Secondary | ICD-10-CM | POA: Insufficient documentation

## 2015-04-17 DIAGNOSIS — E1169 Type 2 diabetes mellitus with other specified complication: Secondary | ICD-10-CM

## 2015-04-17 DIAGNOSIS — I2510 Atherosclerotic heart disease of native coronary artery without angina pectoris: Secondary | ICD-10-CM | POA: Insufficient documentation

## 2015-04-17 DIAGNOSIS — H409 Unspecified glaucoma: Secondary | ICD-10-CM | POA: Insufficient documentation

## 2015-04-17 DIAGNOSIS — R945 Abnormal results of liver function studies: Secondary | ICD-10-CM | POA: Insufficient documentation

## 2015-04-17 DIAGNOSIS — E785 Hyperlipidemia, unspecified: Secondary | ICD-10-CM | POA: Insufficient documentation

## 2015-04-17 DIAGNOSIS — E1121 Type 2 diabetes mellitus with diabetic nephropathy: Secondary | ICD-10-CM | POA: Insufficient documentation

## 2015-04-17 DIAGNOSIS — M1990 Unspecified osteoarthritis, unspecified site: Secondary | ICD-10-CM | POA: Insufficient documentation

## 2015-04-17 DIAGNOSIS — I10 Essential (primary) hypertension: Secondary | ICD-10-CM | POA: Insufficient documentation

## 2015-04-17 DIAGNOSIS — R7989 Other specified abnormal findings of blood chemistry: Secondary | ICD-10-CM | POA: Insufficient documentation

## 2015-04-17 DIAGNOSIS — N289 Disorder of kidney and ureter, unspecified: Secondary | ICD-10-CM | POA: Insufficient documentation

## 2015-04-27 ENCOUNTER — Other Ambulatory Visit: Payer: Self-pay | Admitting: Family Medicine

## 2015-04-27 ENCOUNTER — Ambulatory Visit: Payer: Self-pay | Admitting: Family Medicine

## 2015-04-27 DIAGNOSIS — H4011X4 Primary open-angle glaucoma, indeterminate stage: Secondary | ICD-10-CM | POA: Diagnosis not present

## 2015-04-29 ENCOUNTER — Telehealth: Payer: Self-pay | Admitting: Family Medicine

## 2015-04-29 NOTE — Telephone Encounter (Signed)
Pt is stating paper work was sent to [place] several weeks ago so a nurse can come to home to help pt out.  Pt is not sure who sent the paper work in.  CB#[phone]/MW

## 2015-04-29 NOTE — Telephone Encounter (Signed)
Dr. Rosanna Randy this wasp laced on your desk yesterday. Thank you-aa

## 2015-05-04 ENCOUNTER — Other Ambulatory Visit: Payer: Self-pay | Admitting: Family Medicine

## 2015-05-04 DIAGNOSIS — I10 Essential (primary) hypertension: Secondary | ICD-10-CM

## 2015-05-04 NOTE — Telephone Encounter (Signed)
Patient advised to make appointment.  ED

## 2015-05-04 NOTE — Telephone Encounter (Signed)
Pt called back asking about the forms that were dropped off for in home care. Pt would like a call back. Thanks TNP

## 2015-05-04 NOTE — Telephone Encounter (Signed)
Patient had an appointment last week which she did not keep where could have answered the questions that are asked on this form.

## 2015-05-04 NOTE — Telephone Encounter (Signed)
Last ov was on 04/27/2015.   Thanks,

## 2015-05-06 ENCOUNTER — Ambulatory Visit (INDEPENDENT_AMBULATORY_CARE_PROVIDER_SITE_OTHER): Payer: Medicare Other | Admitting: Family Medicine

## 2015-05-06 ENCOUNTER — Encounter: Payer: Self-pay | Admitting: Family Medicine

## 2015-05-06 VITALS — BP 128/66 | HR 64 | Temp 98.0°F | Resp 12 | Ht 64.0 in | Wt 145.0 lb

## 2015-05-06 DIAGNOSIS — R29898 Other symptoms and signs involving the musculoskeletal system: Secondary | ICD-10-CM | POA: Diagnosis not present

## 2015-05-06 DIAGNOSIS — I2510 Atherosclerotic heart disease of native coronary artery without angina pectoris: Secondary | ICD-10-CM

## 2015-05-06 DIAGNOSIS — E119 Type 2 diabetes mellitus without complications: Secondary | ICD-10-CM | POA: Diagnosis not present

## 2015-05-06 DIAGNOSIS — H409 Unspecified glaucoma: Secondary | ICD-10-CM

## 2015-05-06 DIAGNOSIS — Z23 Encounter for immunization: Secondary | ICD-10-CM | POA: Diagnosis not present

## 2015-05-06 DIAGNOSIS — Z0000 Encounter for general adult medical examination without abnormal findings: Secondary | ICD-10-CM | POA: Diagnosis not present

## 2015-05-06 DIAGNOSIS — R5383 Other fatigue: Secondary | ICD-10-CM

## 2015-05-06 DIAGNOSIS — R2681 Unsteadiness on feet: Secondary | ICD-10-CM

## 2015-05-06 DIAGNOSIS — M1610 Unilateral primary osteoarthritis, unspecified hip: Secondary | ICD-10-CM

## 2015-05-06 DIAGNOSIS — M1990 Unspecified osteoarthritis, unspecified site: Secondary | ICD-10-CM | POA: Diagnosis not present

## 2015-05-06 DIAGNOSIS — M545 Low back pain, unspecified: Secondary | ICD-10-CM

## 2015-05-06 NOTE — Progress Notes (Signed)
Patient ID: RAKEL JUNIO, female   DOB: 1926-02-09, 82 y.o.   MRN: [id number]    Subjective:  HPI  Patient is here to discuss getting assistance to help with daily living.  She needs help with cleaning, cooking, bathing, going to the store.  She has unsteady gait and uses a cane, weakness in her right hip, pain in her Right shoulder into the neck that is chronic. She currently lives by herself. She has grandchildren and her daughter come and help her with daily living activities at this moment.  Prior to Admission medications   Medication Sig Start Date End Date Taking? Authorizing Provider  brimonidine (ALPHAGAN P) 0.1 % SOLN Place 1 drop into both eyes every 8 (eight) hours.   Yes Historical Provider, MD  dorzolamide (TRUSOPT) 2 % ophthalmic solution Place 1 drop into both eyes 3 (three) times daily.   Yes Historical Provider, MD  amiodarone (PACERONE) 200 MG tablet Take 1 tablet (200 mg total) by mouth daily. 06/08/13   Erin R Barrett, PA-C  aspirin EC 325 MG EC tablet Take 1 tablet (325 mg total) by mouth daily. 06/08/13   Erin R Barrett, PA-C  atorvastatin (LIPITOR) 20 MG tablet Take 1 tablet (20 mg total) by mouth daily at 6 PM. 06/08/13   Erin R Barrett, PA-C  furosemide (LASIX) 40 MG tablet TAKE 1 TABLET BY MOUTH EVERY DAY 09/12/13   Nicanor Alcon, MD  gabapentin (NEURONTIN) 100 MG capsule Take 100 mg by mouth 2 (two) times daily. 02/06/15   Historical Provider, MD  glucose blood (BAYER CONTOUR NEXT TEST) test strip BAYER CONTOUR NEXT TEST (In Vitro Strip)  1 (one) Strip Strip two times daily and as needed for 0 days  Quantity: 100;  Refills: 12   Ordered :24-Dec-2014  Celene Kras, MA, Anastasiya ;  Started 24-Dec-2014 Active Comments: Medication taken as needed. With lancets. Diagnosis code E11.8 12/24/14   Historical Provider, MD  hydrochlorothiazide (HYDRODIURIL) 25 MG tablet Take by mouth. 09/08/14   Historical Provider, MD  insulin detemir (LEVEMIR) 100 UNIT/ML injection  Inject 0.2 mLs (20 Units total) into the skin daily before supper. 01/23/15   Richard Maceo Pro., MD  LEVEMIR FLEXTOUCH 100 UNIT/ML Pen INJECT 20 UNITS SUBQ DAILY 03/31/15   Jerrol Banana., MD  lisinopril (PRINIVIL,ZESTRIL) 10 MG tablet TAKE 1 TABLET BY MOUTH EVERY DAY 03/03/15   Jerrol Banana., MD  lisinopril (PRINIVIL,ZESTRIL) 2.5 MG tablet Take 1 tablet (2.5 mg total) by mouth daily. 06/08/13   Erin R Barrett, PA-C  meloxicam (MOBIC) 7.5 MG tablet Take 7.5 mg by mouth daily. with food 03/31/15   Historical Provider, MD  metoprolol tartrate (LOPRESSOR) 25 MG tablet TAKE 1/2 TABLET BY MOUTH DAILY 05/04/15   Jerrol Banana., MD  pioglitazone (ACTOS) 45 MG tablet TAKE 1 TABLET BY MOUTH DAILY 03/10/15   Jerrol Banana., MD  prednisoLONE acetate (PRED FORTE) 1 % ophthalmic suspension PLACE 1 DROP IN LEFT EYE 4 TIMES A DAY 03/10/15   Historical Provider, MD  SIMBRINZA 1-0.2 % SUSP INSTILL 1 DROP INTO BOTH EYES TWICE A DAY 03/28/15   Historical Provider, MD  SURE COMFORT PEN NEEDLES 31G X 5 MM MISC INJECT ONCE DAILY AS DIRECTED 04/27/15   Jerrol Banana., MD  traMADol (ULTRAM) 50 MG tablet Take 1 tablet (50 mg total) by mouth every 6 (six) hours as needed. 06/08/13   Erin R Barrett, PA-C  travoprost, benzalkonium, (TRAVATAN) 0.004 % ophthalmic  solution Place 1 drop into both eyes at bedtime.    Historical Provider, MD    Patient Active Problem List   Diagnosis Date Noted  . Abnormal LFTs (liver function tests) 04/17/2015  . Anemia of diabetes 04/17/2015  . Atherosclerosis of coronary artery 04/17/2015  . Diabetic kidney 04/17/2015  . Essential (primary) hypertension 04/17/2015  . Glaucoma 04/17/2015  . HLD (hyperlipidemia) 04/17/2015  . Disorder of kidney 04/17/2015  . Arthritis, degenerative 04/17/2015  . Arthritis, hip 04/26/2013  . Left main coronary artery disease 04/26/2013  . Ischemic mitral regurgitation 04/26/2013  . Hypertension   . Diabetes mellitus   .  Hyperlipidemia     Past Medical History  Diagnosis Date  . Hypertension   . Diabetes mellitus   . Hyperlipidemia   . Arthritis   . Glaucoma   . Shortness of breath     with exertion    Social History   Social History  . Marital Status: Widowed    Spouse Name: N/A  . Number of Children: 7  . Years of Education: N/A   Occupational History  . retired    Social History Main Topics  . Smoking status: Never Smoker   . Smokeless tobacco: Never Used  . Alcohol Use: No  . Drug Use: No  . Sexual Activity: No   Other Topics Concern  . Not on file   Social History Narrative    Allergies  Allergen Reactions  . Sulfa Antibiotics Rash    Review of Systems  Constitutional: Positive for malaise/fatigue.  HENT: Positive for hearing loss.   Eyes: Positive for photophobia, pain and discharge.  Respiratory: Positive for shortness of breath.        Dyspnea on exertion after walking about 100 feet.  Cardiovascular: Negative.   Gastrointestinal: Negative.   Musculoskeletal: Positive for joint pain and neck pain.  Neurological: Negative.  Negative for tingling and sensory change.  Psychiatric/Behavioral: Negative.     Immunization History  Administered Date(s) Administered  . Pneumococcal Conjugate-13 03/11/2014  . Pneumococcal Polysaccharide-23 07/27/2005  . Td 11/10/1994  . Tdap 03/05/2013  . Zoster 03/05/2013   Objective:  BP 128/66 mmHg  Pulse 64  Temp(Src) 98 F (36.7 C)  Resp 12  Ht '5\' 4"'  (1.626 m)  Wt 145 lb (65.772 kg)  BMI 24.88 kg/m2  Physical Exam  Constitutional: She is oriented to person, place, and time and well-developed, well-nourished, and in no distress.  HENT:  Head: Normocephalic and atraumatic.  Right Ear: External ear normal.  Left Ear: External ear normal.  Nose: Nose normal.  Eyes: Conjunctivae are normal.  Cardiovascular: Normal rate, regular rhythm and normal heart sounds.   Pulmonary/Chest: Effort normal.  Abdominal: Soft.    Neurological: She is alert and oriented to person, place, and time.  Decreased sensation in both feet. Wide gait--uses cane with any walking.  Skin: Skin is warm and dry.  Psychiatric: Mood, memory, affect and judgment normal.    Lab Results  Component Value Date   WBC 8.8 12/23/2014   HGB 10.5* 12/23/2014   HCT 32* 12/23/2014   PLT 221 12/23/2014   GLUCOSE 99 06/06/2013   CHOL 138 12/23/2014   TRIG 106 12/23/2014   HDL 46 12/23/2014   LDLCALC 71 12/23/2014   TSH 1.19 12/23/2014   INR 1.50* 05/31/2013   HGBA1C 6.5* 12/23/2014    CMP     Component Value Date/Time   NA 140 12/23/2014   NA 137 06/06/2013 0540   K  5.1 12/23/2014   CL 102 06/06/2013 0540   CO2 25 06/06/2013 0540   GLUCOSE 99 06/06/2013 0540   BUN 32* 12/23/2014   BUN 37* 06/06/2013 0540   CREATININE 1.5* 12/23/2014   CREATININE 1.09 06/06/2013 0540   CALCIUM 8.5 06/06/2013 0540   PROT 6.6 06/05/2013 0431   ALBUMIN 2.7* 06/05/2013 0431   AST 0* 12/23/2014   ALT 98* 12/23/2014   ALKPHOS 404* 12/23/2014   BILITOT 0.5 06/05/2013 0431   GFRNONAA 45* 06/06/2013 0540   GFRAA 52* 06/06/2013 0540    Assessment and Plan :  1. Weakness of right lower extremity - DG HIPS BILAT WITH PELVIS 3-4 VIEWS; Future  2. Unsteady gait - CBC with Differential/Platelet - Comprehensive metabolic panel  3. Atherosclerosis of native coronary artery of native heart without angina pectoris I do not think DOE is anginal,simply deconditioning in 81 yo lady.  4. Type 2 diabetes mellitus without complication/neuropathy  - HgB A1c  5. Arthritis, hip  - Sed Rate (ESR)  6. Glaucoma   7. Need for influenza vaccination  - Flu vaccine HIGH DOSE PF (Fluzone High dose)  8. Bilateral low back pain without sciatica  - DG Lumbar Spine Complete; Future - DG HIPS BILAT WITH PELVIS 3-4 VIEWS; Future  9. Other fatigue Pt needs help with ADLs,specifically with bathing.,cooking.  - TSH - Sed Rate (ESR)   Miguel Aschoff  MD [facility] 05/06/2015 9:55 AM

## 2015-05-07 LAB — CBC WITH DIFFERENTIAL/PLATELET
BASOS: 0 %
Basophils Absolute: 0 10*3/uL (ref 0.0–0.2)
EOS (ABSOLUTE): 0.1 10*3/uL (ref 0.0–0.4)
EOS: 1 %
HEMATOCRIT: 31 % — AB (ref 34.0–46.6)
HEMOGLOBIN: 10.3 g/dL — AB (ref 11.1–15.9)
IMMATURE GRANS (ABS): 0 10*3/uL (ref 0.0–0.1)
IMMATURE GRANULOCYTES: 0 %
Lymphocytes Absolute: 3.8 10*3/uL — ABNORMAL HIGH (ref 0.7–3.1)
Lymphs: 50 %
MCH: 28.8 pg (ref 26.6–33.0)
MCHC: 33.2 g/dL (ref 31.5–35.7)
MCV: 87 fL (ref 79–97)
MONOCYTES: 9 %
MONOS ABS: 0.6 10*3/uL (ref 0.1–0.9)
NEUTROS PCT: 40 %
Neutrophils Absolute: 3 10*3/uL (ref 1.4–7.0)
Platelets: 233 10*3/uL (ref 150–379)
RBC: 3.58 x10E6/uL — AB (ref 3.77–5.28)
RDW: 14.6 % (ref 12.3–15.4)
WBC: 7.5 10*3/uL (ref 3.4–10.8)

## 2015-05-07 LAB — COMPREHENSIVE METABOLIC PANEL
A/G RATIO: 0.9 — AB (ref 1.1–2.5)
ALBUMIN: 4.1 g/dL (ref 3.5–4.7)
ALT: 14 IU/L (ref 0–32)
AST: 18 IU/L (ref 0–40)
Alkaline Phosphatase: 218 IU/L — ABNORMAL HIGH (ref 39–117)
BUN / CREAT RATIO: 22 (ref 11–26)
BUN: 43 mg/dL — ABNORMAL HIGH (ref 8–27)
Bilirubin Total: 0.4 mg/dL (ref 0.0–1.2)
CALCIUM: 9.7 mg/dL (ref 8.7–10.3)
CO2: 19 mmol/L (ref 18–29)
CREATININE: 1.92 mg/dL — AB (ref 0.57–1.00)
Chloride: 101 mmol/L (ref 97–108)
GFR, EST AFRICAN AMERICAN: 27 mL/min/{1.73_m2} — AB (ref >59)
GFR, EST NON AFRICAN AMERICAN: 23 mL/min/{1.73_m2} — AB (ref >59)
GLOBULIN, TOTAL: 4.4 g/dL (ref 1.5–4.5)
Glucose: 124 mg/dL — ABNORMAL HIGH (ref 65–99)
POTASSIUM: 5.6 mmol/L — AB (ref 3.5–5.2)
SODIUM: 138 mmol/L (ref 134–144)
TOTAL PROTEIN: 8.5 g/dL (ref 6.0–8.5)

## 2015-05-07 LAB — HEMOGLOBIN A1C
Est. average glucose Bld gHb Est-mCnc: 143 mg/dL
HEMOGLOBIN A1C: 6.6 % — AB (ref 4.8–5.6)

## 2015-05-07 LAB — SEDIMENTATION RATE: Sed Rate: 44 mm/hr — ABNORMAL HIGH (ref 0–40)

## 2015-05-07 LAB — TSH: TSH: 1.74 u[IU]/mL (ref 0.450–4.500)

## 2015-05-08 ENCOUNTER — Telehealth: Payer: Self-pay | Admitting: Family Medicine

## 2015-05-08 NOTE — Telephone Encounter (Signed)
thanks

## 2015-05-08 NOTE — Telephone Encounter (Signed)
See below-aa 

## 2015-05-08 NOTE — Telephone Encounter (Signed)
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  Pamala Hurry wanted to let you know that the person who is 1st has been on the list since March.  So it will probably be awhile.  You can call her back if you have any questions.  Thanks Con Memos

## 2015-05-12 ENCOUNTER — Telehealth: Payer: Self-pay | Admitting: Family Medicine

## 2015-05-12 NOTE — Telephone Encounter (Signed)
Pt os requesting a call back abut her medications.  CB#[phone]/MW

## 2015-05-13 ENCOUNTER — Telehealth: Payer: Self-pay | Admitting: Family Medicine

## 2015-05-13 DIAGNOSIS — R200 Anesthesia of skin: Secondary | ICD-10-CM

## 2015-05-13 NOTE — Telephone Encounter (Signed)
Discussed it with the Gabriela [name]

## 2015-05-13 NOTE — Telephone Encounter (Signed)
Pt's daughter Vickii Chafe would like a nurse to return her call. She stated that pt came in July and said she was having trouble with her toe now she can not feel her foot and can not walk. Peggy thinks we need to order some test. Peggy we request a nurse to call her back tomorrow morning b/t 8 am and 12 pm. Thanks TNP

## 2015-05-14 ENCOUNTER — Telehealth: Payer: Self-pay | Admitting: Family Medicine

## 2015-05-14 NOTE — Telephone Encounter (Signed)
Pt called inquiring about a home health agency coming in and helping her.   Pt states she was in the office last Wednesday and spoke with you about this.   Her call back is [phone]  Thanks, Con Memos

## 2015-05-14 NOTE — Telephone Encounter (Signed)
Patient was seen about a week ago and mentioned that her foot was hurting, foot was examined she states and it looked fine, we ordered routine labs and Sed Rate- was 44 which said a little high. Daughter states patient can not feel her foot -left, she states it is mainly in the heel. She has not been able to walk much at all in the past 2 days. Daughter is worried and wonders what can be done, any tests or evaluation needed for this? Please review-aa

## 2015-05-14 NOTE — Telephone Encounter (Signed)
Daughter advised, referral put in, please call daughter between 36 and 31 am with information about this referral.-aa

## 2015-05-14 NOTE — Telephone Encounter (Signed)
Please refer to podiatry 

## 2015-05-15 ENCOUNTER — Other Ambulatory Visit: Payer: Self-pay | Admitting: Family Medicine

## 2015-05-15 NOTE — Telephone Encounter (Signed)
Spoke with patient, patient gave a fax number again and paperwork re faxed-aa

## 2015-05-18 DIAGNOSIS — Z961 Presence of intraocular lens: Secondary | ICD-10-CM | POA: Diagnosis not present

## 2015-05-18 DIAGNOSIS — H18411 Arcus senilis, right eye: Secondary | ICD-10-CM | POA: Diagnosis not present

## 2015-05-18 DIAGNOSIS — H401190 Primary open-angle glaucoma, unspecified eye, stage unspecified: Secondary | ICD-10-CM | POA: Diagnosis not present

## 2015-05-18 DIAGNOSIS — H02839 Dermatochalasis of unspecified eye, unspecified eyelid: Secondary | ICD-10-CM | POA: Diagnosis not present

## 2015-05-20 NOTE — Telephone Encounter (Signed)
Advised patient that paper work was faxed 2 times. Called customer service and they state they did not receive it, acquired mailing address for them and mailed this information to them today, pt advised of all of this.-aa

## 2015-05-20 NOTE — Telephone Encounter (Signed)
Pt would like to speak with a nurse to get an update on home health nurse. Please advise. Thanks TNP

## 2015-06-08 ENCOUNTER — Other Ambulatory Visit: Payer: Self-pay | Admitting: Family Medicine

## 2015-06-15 ENCOUNTER — Other Ambulatory Visit: Payer: Self-pay | Admitting: Family Medicine

## 2015-08-31 ENCOUNTER — Ambulatory Visit: Payer: Medicare Other | Admitting: Family Medicine

## 2015-09-01 ENCOUNTER — Other Ambulatory Visit: Payer: Self-pay

## 2015-09-01 DIAGNOSIS — H401132 Primary open-angle glaucoma, bilateral, moderate stage: Secondary | ICD-10-CM | POA: Diagnosis not present

## 2015-09-01 MED ORDER — GLUCOSE BLOOD VI STRP: ORAL_STRIP | Status: DC

## 2015-09-14 ENCOUNTER — Other Ambulatory Visit: Payer: Self-pay | Admitting: Family Medicine

## 2015-10-02 ENCOUNTER — Ambulatory Visit (INDEPENDENT_AMBULATORY_CARE_PROVIDER_SITE_OTHER): Payer: Medicare Other | Admitting: Family Medicine

## 2015-10-02 ENCOUNTER — Encounter: Payer: Self-pay | Admitting: Family Medicine

## 2015-10-02 VITALS — BP 148/62 | HR 74 | Temp 98.5°F | Resp 14 | Wt 143.6 lb

## 2015-10-02 DIAGNOSIS — J069 Acute upper respiratory infection, unspecified: Secondary | ICD-10-CM | POA: Diagnosis not present

## 2015-10-02 DIAGNOSIS — M1990 Unspecified osteoarthritis, unspecified site: Secondary | ICD-10-CM

## 2015-10-02 DIAGNOSIS — M1610 Unilateral primary osteoarthritis, unspecified hip: Secondary | ICD-10-CM

## 2015-10-02 MED ORDER — PSEUDOEPH-BROMPHEN-DM 30-2-10 MG/5ML PO SYRP: 5.0000 mL | ORAL_SOLUTION | Freq: Three times a day (TID) | ORAL | Status: DC | PRN

## 2015-10-02 MED ORDER — AMOXICILLIN 500 MG PO CAPS: 500.0000 mg | ORAL_CAPSULE | Freq: Three times a day (TID) | ORAL | Status: DC

## 2015-10-02 NOTE — Patient Instructions (Signed)
Upper Respiratory Infection, Adult Most upper respiratory infections (URIs) are a viral infection of the air passages leading to the lungs. A URI affects the nose, throat, and upper air passages. The most common type of URI is nasopharyngitis and is typically referred to as "the common cold." URIs run their course and usually go away on their own. Most of the time, a URI does not require medical attention, but sometimes a bacterial infection in the upper airways can follow a viral infection. This is called a secondary infection. Sinus and middle ear infections are common types of secondary upper respiratory infections. Bacterial pneumonia can also complicate a URI. A URI can worsen asthma and chronic obstructive pulmonary disease (COPD). Sometimes, these complications can require emergency medical care and may be life threatening.  CAUSES Almost all URIs are caused by viruses. A virus is a type of germ and can spread from one person to another.  RISKS FACTORS You may be at risk for a URI if:   You smoke.   You have chronic heart or lung disease.  You have a weakened defense (immune) system.   You are very young or very old.   You have nasal allergies or asthma.  You work in crowded or poorly ventilated areas.  You work in health care facilities or schools. SIGNS AND SYMPTOMS  Symptoms typically develop 2-3 days after you come in contact with a cold virus. Most viral URIs last 7-10 days. However, viral URIs from the influenza virus (flu virus) can last 14-18 days and are typically more severe. Symptoms may include:   Runny or stuffy (congested) nose.   Sneezing.   Cough.   Sore throat.   Headache.   Fatigue.   Fever.   Loss of appetite.   Pain in your forehead, behind your eyes, and over your cheekbones (sinus pain).  Muscle aches.  DIAGNOSIS  Your health care provider may diagnose a URI by:  Physical exam.  Tests to check that your symptoms are not due to  another condition such as:  Strep throat.  Sinusitis.  Pneumonia.  Asthma. TREATMENT  A URI goes away on its own with time. It cannot be cured with medicines, but medicines may be prescribed or recommended to relieve symptoms. Medicines may help:  Reduce your fever.  Reduce your cough.  Relieve nasal congestion. HOME CARE INSTRUCTIONS   Take medicines only as directed by your health care provider.   Gargle warm saltwater or take cough drops to comfort your throat as directed by your health care provider.  Use a warm mist humidifier or inhale steam from a shower to increase air moisture. This may make it easier to breathe.  Drink enough fluid to keep your urine clear or pale yellow.   Eat soups and other clear broths and maintain good nutrition.   Rest as needed.   Return to work when your temperature has returned to normal or as your health care provider advises. You may need to stay home longer to avoid infecting others. You can also use a face mask and careful hand washing to prevent spread of the virus.  Increase the usage of your inhaler if you have asthma.   Do not use any tobacco products, including cigarettes, chewing tobacco, or electronic cigarettes. If you need help quitting, ask your health care provider. PREVENTION  The best way to protect yourself from getting a cold is to practice good hygiene.   Avoid oral or hand contact with people with cold   symptoms.   Wash your hands often if contact occurs.  There is no clear evidence that vitamin C, vitamin E, echinacea, or exercise reduces the chance of developing a cold. However, it is always recommended to get plenty of rest, exercise, and practice good nutrition.  SEEK MEDICAL CARE IF:   You are getting worse rather than better.   Your symptoms are not controlled by medicine.   You have chills.  You have worsening shortness of breath.  You have brown or red mucus.  You have yellow or brown nasal  discharge.  You have pain in your face, especially when you bend forward.  You have a fever.  You have swollen neck glands.  You have pain while swallowing.  You have white areas in the back of your throat. SEEK IMMEDIATE MEDICAL CARE IF:   You have severe or persistent:  Headache.  Ear pain.  Sinus pain.  Chest pain.  You have chronic lung disease and any of the following:  Wheezing.  Prolonged cough.  Coughing up blood.  A change in your usual mucus.  You have a stiff neck.  You have changes in your:  Vision.  Hearing.  Thinking.  Mood. MAKE SURE YOU:   Understand these instructions.  Will watch your condition.  Will get help right away if you are not doing well or get worse.   This information is not intended to replace advice given to you by your health care provider. Make sure you discuss any questions you have with your health care provider.   Document Released: 01/25/2001 Document Revised: 12/16/2014 Document Reviewed: 11/06/2013 Elsevier Interactive Patient Education 2016 [employer] Inc[employer]  

## 2015-10-02 NOTE — Progress Notes (Signed)
Patient ID: Gabriela [name], female   DOB: 1926-08-08, 82 y.o.   MRN: [id number]   [name] Female    DOB: 1926-03-13   82 y.o.   MRN: [id number] Visit Date: 10/02/2015  Today's Provider: Vernie Murders, PA   Chief Complaint  Patient presents with  . URI   Subjective:    URI  This is a new problem. The current episode started in the past 7 days. The problem has been unchanged. There has been no fever. Associated symptoms include congestion, coughing, ear pain, rhinorrhea and sneezing. Pertinent negatives include no sore throat. Associated symptoms comments: Nasal congestion and some pain in the right ear last night. Pressure sensation without dizziness. . Treatments tried: Rubotussin. The treatment provided mild relief.   Past Medical History  Diagnosis Date  . Hypertension   . Diabetes mellitus ([place])   . Hyperlipidemia   . Arthritis   . Glaucoma   . Shortness of breath     with exertion   Past Surgical History  Procedure Laterality Date  . Hip surgery      right  . Joint replacement    . Cholecystectomy    . Tonsillectomy    . Cardiac catheterization  04/22/13  . Coronary artery bypass graft N/A 05/31/2013    Procedure: Coronary Artery Bypass Grafting Times Three Using Left Internal Mammary Artery and Right Saphenous Leg Vein Harvested Endoscopically;  Surgeon: Ivin Poot, MD;  Location: [place] [facility];  Service: Open Heart Surgery;  Laterality: N/A;  . Mitral valve repair N/A 05/31/2013    Procedure: MITRAL VALVE REPAIR (MVR);  Surgeon: Ivin Poot, MD;  Location: [facility];  Service: Open Heart Surgery;  Laterality: N/A;  . Intraoperative transesophageal echocardiogram N/A 05/31/2013    Procedure: INTRAOPERATIVE TRANSESOPHAGEAL ECHOCARDIOGRAM;  Surgeon: Ivin Poot, MD;  Location: [facility];  Service: Open Heart Surgery;  Laterality: N/A;   Family History  Problem Relation Age of Onset  . Diabetes Sister   . Hypertension Mother      Previous Medications   ASPIRIN EC 325 MG EC TABLET    Take 1 tablet (325 mg total) by mouth daily.   GABAPENTIN (NEURONTIN) 100 MG CAPSULE    TAKE ONE CAPSULE BY MOUTH TWICE A DAY   GLUCOSE BLOOD (BAYER CONTOUR NEXT TEST) TEST STRIP    Check sugar twice daily. DX: E11.9   HYDROCHLOROTHIAZIDE (HYDRODIURIL) 25 MG TABLET    TAKE 1 TABLET BY MOUTH ONCE A DAY   LATANOPROST (XALATAN) 0.005 % OPHTHALMIC SOLUTION    PLACE 1 DROP INTO BOTH EYES EVERY NIGHT AT BEDTIME   LEVEMIR FLEXTOUCH 100 UNIT/ML PEN    INJECT 20 UNITS SUBQ DAILY   MELOXICAM (MOBIC) 7.5 MG TABLET    Take 7.5 mg by mouth daily. with food   METOPROLOL TARTRATE (LOPRESSOR) 25 MG TABLET    TAKE 1/2 TABLET BY MOUTH DAILY   PIOGLITAZONE (ACTOS) 45 MG TABLET    TAKE 1 TABLET BY MOUTH DAILY   PREDNISOLONE ACETATE (PRED FORTE) 1 % OPHTHALMIC SUSPENSION    PLACE 1 DROP IN LEFT EYE 4 TIMES A DAY   SIMBRINZA 1-0.2 % SUSP    INSTILL 1 DROP INTO BOTH EYES TWICE A DAY   SURE COMFORT PEN NEEDLES 31G X 5 MM MISC    INJECT ONCE DAILY AS DIRECTED   TRAVOPROST, BENZALKONIUM, (TRAVATAN) 0.004 % OPHTHALMIC SOLUTION    Place 1 drop into both eyes at bedtime.   Allergies  Allergen Reactions  .  Sulfa Antibiotics Rash    Review of Systems  Constitutional: Negative.   HENT: Positive for congestion, ear pain, rhinorrhea and sneezing. Negative for sore throat.   Eyes: Negative.   Respiratory: Positive for cough.   Cardiovascular: Negative.   Gastrointestinal: Negative.   Endocrine: Negative.   Genitourinary: Negative.   Musculoskeletal: Negative.   Skin: Negative.   Allergic/Immunologic: Negative.   Neurological: Negative.   Hematological: Negative.   Psychiatric/Behavioral: Negative.     Social History  Substance Use Topics  . Smoking status: Never Smoker   . Smokeless tobacco: Never Used  . Alcohol Use: No   Objective:   BP 148/62 mmHg  Pulse 74  Temp(Src) 98.5 F (36.9 C) (Oral)  Resp 14  Wt 143 lb 9.6 oz (65.137 kg)  SpO2 97%  BP Readings from Last 3  Encounters:  10/02/15 148/62  05/06/15 128/66  12/23/14 148/76    Physical Exam  Constitutional: She is oriented to person, place, and time. She appears well-developed and well-nourished.  HENT:  Head: Normocephalic.  Right Ear: External ear normal.  Left Ear: External ear normal.  Nose: Nose normal.  Mouth/Throat: Oropharynx is clear and moist.  Eyes: Conjunctivae and EOM are normal.  Neck: Normal range of motion. Neck supple.  Cardiovascular: Normal rate, regular rhythm and normal heart sounds.   Pulmonary/Chest: Effort normal and breath sounds normal.  Abdominal: Soft. Bowel sounds are normal.  Musculoskeletal:  Some right hip pain with history of joint replacement.  Neurological: She is alert and oriented to person, place, and time.  Skin: No rash noted.  Psychiatric: She has a normal mood and affect. Her behavior is normal.      Assessment & Plan:     1. Upper respiratory infection Onset over the past 7 days without fever. With history of heart disease, CABG and mitral valve repair, recommend antibiotic and congestion medication. Increase fluid intake and recheck in 5-7 days if no better. - amoxicillin (AMOXIL) 500 MG capsule; Take 1 capsule (500 mg total) by mouth 3 (three) times daily.  Dispense: 30 capsule; Refill: 0 - brompheniramine-pseudoephedrine-DM 30-2-10 MG/5ML syrup; Take 5 mLs by mouth 3 (three) times daily as needed.  Dispense: 120 mL; Refill: 0  2. Arthritis, hip History of right hip joint replacement followed by Dr. Sabra Heck (orthopedist) regularly.

## 2015-10-13 ENCOUNTER — Other Ambulatory Visit: Payer: Self-pay | Admitting: Family Medicine

## 2015-10-15 ENCOUNTER — Telehealth: Payer: Self-pay | Admitting: Family Medicine

## 2015-10-15 ENCOUNTER — Other Ambulatory Visit: Payer: Self-pay

## 2015-10-15 DIAGNOSIS — Z794 Long term (current) use of insulin: Principal | ICD-10-CM

## 2015-10-15 DIAGNOSIS — E118 Type 2 diabetes mellitus with unspecified complications: Secondary | ICD-10-CM

## 2015-10-15 MED ORDER — GLUCOSE BLOOD VI STRP: ORAL_STRIP | Status: DC

## 2015-10-15 NOTE — Telephone Encounter (Signed)
Pt states her blood glucose meter is no longer working.  Pt is asking how she can get a new meter.  [facility].  CB#[phone]/MW

## 2015-10-19 ENCOUNTER — Other Ambulatory Visit: Payer: Self-pay | Admitting: Family Medicine

## 2015-10-19 DIAGNOSIS — Z794 Long term (current) use of insulin: Principal | ICD-10-CM

## 2015-10-19 DIAGNOSIS — E118 Type 2 diabetes mellitus with unspecified complications: Secondary | ICD-10-CM

## 2015-10-19 MED ORDER — GLUCOSE BLOOD VI STRP: ORAL_STRIP | Status: DC

## 2015-11-02 ENCOUNTER — Other Ambulatory Visit: Payer: Self-pay | Admitting: Family Medicine

## 2015-11-03 ENCOUNTER — Other Ambulatory Visit: Payer: Self-pay | Admitting: Family Medicine

## 2015-12-16 ENCOUNTER — Other Ambulatory Visit: Payer: Self-pay | Admitting: Family Medicine

## 2015-12-31 DIAGNOSIS — H40153 Residual stage of open-angle glaucoma, bilateral: Secondary | ICD-10-CM | POA: Diagnosis not present

## 2016-01-17 ENCOUNTER — Emergency Department: Payer: Medicare Other

## 2016-01-17 ENCOUNTER — Emergency Department
Admission: EM | Admit: 2016-01-17 | Discharge: 2016-01-17 | Disposition: A | Discharge status: Home / Self Care | Payer: Medicare Other | Attending: Emergency Medicine | Admitting: Emergency Medicine

## 2016-01-17 DIAGNOSIS — E785 Hyperlipidemia, unspecified: Secondary | ICD-10-CM | POA: Insufficient documentation

## 2016-01-17 DIAGNOSIS — I10 Essential (primary) hypertension: Secondary | ICD-10-CM | POA: Insufficient documentation

## 2016-01-17 DIAGNOSIS — M1610 Unilateral primary osteoarthritis, unspecified hip: Secondary | ICD-10-CM | POA: Diagnosis not present

## 2016-01-17 DIAGNOSIS — W19XXXA Unspecified fall, initial encounter: Secondary | ICD-10-CM

## 2016-01-17 DIAGNOSIS — E119 Type 2 diabetes mellitus without complications: Secondary | ICD-10-CM | POA: Insufficient documentation

## 2016-01-17 DIAGNOSIS — M25552 Pain in left hip: Secondary | ICD-10-CM | POA: Insufficient documentation

## 2016-01-17 DIAGNOSIS — M25551 Pain in right hip: Secondary | ICD-10-CM | POA: Diagnosis not present

## 2016-01-17 DIAGNOSIS — W1839XA Other fall on same level, initial encounter: Secondary | ICD-10-CM | POA: Insufficient documentation

## 2016-01-17 DIAGNOSIS — Z79899 Other long term (current) drug therapy: Secondary | ICD-10-CM | POA: Diagnosis not present

## 2016-01-17 DIAGNOSIS — Z951 Presence of aortocoronary bypass graft: Secondary | ICD-10-CM | POA: Diagnosis not present

## 2016-01-17 DIAGNOSIS — R102 Pelvic and perineal pain: Secondary | ICD-10-CM | POA: Diagnosis not present

## 2016-01-17 DIAGNOSIS — Z794 Long term (current) use of insulin: Secondary | ICD-10-CM | POA: Insufficient documentation

## 2016-01-17 DIAGNOSIS — Z7982 Long term (current) use of aspirin: Secondary | ICD-10-CM | POA: Diagnosis not present

## 2016-01-17 NOTE — ED Notes (Addendum)
Pt reports falling yesterday on her bottom pt reports right hip pain that is worse with walking. Reports having right hip replacement years ago. Denies hitting head. 10/10 pain

## 2016-01-17 NOTE — ED Notes (Signed)
Pt ambulated approximately 40 feet with walker.  Steady gait with walker; no assistance needed.  MD notified.

## 2016-01-17 NOTE — Discharge Instructions (Signed)
You have been seen in the Emergency Department (ED) today for a fall.  Your work up does not show any concerning injuries.  Please take over-the-counter ibuprofen and/or Tylenol as needed for your pain (unless you have an allergy or your doctor as told you not to take them), or take any prescribed medication as instructed.  BE SURE TO USE YOUR WALKER WHENEVER YOU ARE UP AND ABOUT!  Please follow up with your doctor regarding today's Emergency Department (ED) visit and your recent fall.    Return to the ED if you have any headache, confusion, slurred speech, weakness/numbness of any arm or leg, or any increased pain.   Hip Pain Your hip is the joint between your upper legs and your lower pelvis. The bones, cartilage, tendons, and muscles of your hip joint perform a lot of work each day supporting your body weight and allowing you to move around. Hip pain can range from a minor ache to severe pain in one or both of your hips. Pain may be felt on the inside of the hip joint near the groin, or the outside near the buttocks and upper thigh. You may have swelling or stiffness as well.  HOME CARE INSTRUCTIONS   Take medicines only as directed by your health care provider.  Apply ice to the injured area:  Put ice in a plastic bag.  Place a towel between your skin and the bag.  Leave the ice on for 15-20 minutes at a time, 3-4 times a day.  Keep your leg raised (elevated) when possible to lessen swelling.  Avoid activities that cause pain.  Follow specific exercises as directed by your health care provider.  Sleep with a pillow between your legs on your most comfortable side.  Record how often you have hip pain, the location of the pain, and what it feels like. SEEK MEDICAL CARE IF:   You are unable to put weight on your leg.  Your hip is red or swollen or very tender to touch.  Your pain or swelling continues or worsens after 1 week.  You have increasing difficulty walking.  You  have a fever. SEEK IMMEDIATE MEDICAL CARE IF:   You have fallen.  You have a sudden increase in pain and swelling in your hip. MAKE SURE YOU:   Understand these instructions.  Will watch your condition.  Will get help right away if you are not doing well or get worse.   This information is not intended to replace advice given to you by your health care provider. Make sure you discuss any questions you have with your health care provider.   Document Released: 01/19/2010 Document Revised: 08/22/2014 Document Reviewed: 03/28/2013 Elsevier Interactive Patient Education 2016 [place] therapy can help ease sore, stiff, injured, and tight muscles and joints. Heat relaxes your muscles, which may help ease your pain.  RISKS AND COMPLICATIONS If you have any of the following conditions, do not use heat therapy unless your health care provider has approved:  Poor circulation.  Healing wounds or scarred skin in the area being treated.  Diabetes, heart disease, or high blood pressure.  Not being able to feel (numbness) the area being treated.  Unusual swelling of the area being treated.  Active infections.  Blood clots.  Cancer.  Inability to communicate pain. This may include young children and people who have problems with their brain function (dementia).  Pregnancy. Heat therapy should only be used on old, pre-existing, or  long-lasting (chronic) injuries. Do not use heat therapy on new injuries unless directed by your health care provider. HOW TO USE HEAT THERAPY There are several different kinds of heat therapy, including:  Moist heat pack.  Warm water bath.  Hot water bottle.  Electric heating pad.  Heated gel pack.  Heated wrap.  Electric heating pad. Use the heat therapy method suggested by your health care provider. Follow your health care provider's instructions on when and how to use heat therapy. GENERAL HEAT THERAPY  RECOMMENDATIONS  Do not sleep while using heat therapy. Only use heat therapy while you are awake.  Your skin may turn pink while using heat therapy. Do not use heat therapy if your skin turns red.  Do not use heat therapy if you have new pain.  High heat or long exposure to heat can cause burns. Be careful when using heat therapy to avoid burning your skin.  Do not use heat therapy on areas of your skin that are already irritated, such as with a rash or sunburn. SEEK MEDICAL CARE IF:  You have blisters, redness, swelling, or numbness.  You have new pain.  Your pain is worse. MAKE SURE YOU:  Understand these instructions.  Will watch your condition.  Will get help right away if you are not doing well or get worse.   This information is not intended to replace advice given to you by your health care provider. Make sure you discuss any questions you have with your health care provider.   Document Released: 10/24/2011 Document Revised: 08/22/2014 Document Reviewed: 09/24/2013 Elsevier Interactive Patient Education [employer].

## 2016-01-17 NOTE — ED Notes (Signed)
MD at bedside. 

## 2016-01-17 NOTE — ED Provider Notes (Signed)
[facility] Emergency Department Provider Note  ____________________________________________  Time seen: Approximately 11:26 AM  I have reviewed the triage vital signs and the nursing notes.   HISTORY  Chief Complaint Hip Pain    HPI KYLIA [name] is a 82 y.o. female who has a history of triple bypass surgery due to coronary artery disease, as well as a right hip replacement many years ago, who presents with pain in her right hip after a fall yesterday evening.  She reports that she was walking to the stove using her cane when the cane without from under her causing her to fall to the ground.  She had acute onset of severe pain which rapidly improved to only mild discomfort.  However she was unable to get back up without assistance and had to crawl to a phone.  She was on the ground for a total of about 15 minutes before assistance arrived and helped her back up.  Reportedly EMS came out and helped her back up and she refused transport to the hospital.  However her pain has gotten gradually worse since the initial fall and today she is not able to bear weight on that right leg.  She reports that if she is lying or sitting and not moving there is only very slight discomfort, but when she moves or tries to bear weight the pain is sharp and severe.  She denies losing consciousness, hitting her head, headache, neck pain, chest pain, shortness of breath, nausea, vomiting, diarrhea, dysuria, abdominal pain.   Past Medical History  Diagnosis Date  . Hypertension   . Diabetes mellitus ([place])   . Hyperlipidemia   . Arthritis   . Glaucoma   . Shortness of breath     with exertion    Patient Active Problem List   Diagnosis Date Noted  . Abnormal LFTs (liver function tests) 04/17/2015  . Anemia of diabetes 04/17/2015  . Atherosclerosis of coronary artery 04/17/2015  . Diabetic kidney ([place]) 04/17/2015  . Essential (primary) hypertension 04/17/2015  . Glaucoma  04/17/2015  . HLD (hyperlipidemia) 04/17/2015  . Disorder of kidney 04/17/2015  . Arthritis, degenerative 04/17/2015  . Arthritis, hip 04/26/2013  . Left main coronary artery disease 04/26/2013  . Ischemic mitral regurgitation 04/26/2013  . Hypertension   . Diabetes mellitus ([place])   . Hyperlipidemia     Past Surgical History  Procedure Laterality Date  . Hip surgery      right  . Joint replacement    . Cholecystectomy    . Tonsillectomy    . Cardiac catheterization  04/22/13  . Coronary artery bypass graft N/A 05/31/2013    Procedure: Coronary Artery Bypass Grafting Times Three Using Left Internal Mammary Artery and Right Saphenous Leg Vein Harvested Endoscopically;  Surgeon: Ivin Poot, MD;  Location: [facility];  Service: Open Heart Surgery;  Laterality: N/A;  . Mitral valve repair N/A 05/31/2013    Procedure: MITRAL VALVE REPAIR (MVR);  Surgeon: Ivin Poot, MD;  Location: [facility];  Service: Open Heart Surgery;  Laterality: N/A;  . Intraoperative transesophageal echocardiogram N/A 05/31/2013    Procedure: INTRAOPERATIVE TRANSESOPHAGEAL ECHOCARDIOGRAM;  Surgeon: Ivin Poot, MD;  Location: [facility];  Service: Open Heart Surgery;  Laterality: N/A;    Current Outpatient Rx  Name  Route  Sig  Dispense  Refill  . amoxicillin (AMOXIL) 500 MG capsule   Oral   Take 1 capsule (500 mg total) by mouth 3 (three) times daily.  30 capsule   0   . aspirin EC 325 MG EC tablet   Oral   Take 1 tablet (325 mg total) by mouth daily.   30 tablet   0   . BAYER CONTOUR NEXT TEST test strip      CHECK SUGAR TWICE DAILY. DX: E11.9   50 each   12     DX Code Needed  .   . brompheniramine-pseudoephedrine-DM 30-2-10 MG/5ML syrup   Oral   Take 5 mLs by mouth 3 (three) times daily as needed.   120 mL   0   . gabapentin (NEURONTIN) 100 MG capsule      TAKE ONE CAPSULE BY MOUTH TWICE A DAY   180 capsule   3   . glucose blood (ACCU-CHEK AVIVA PLUS) test strip      Test glucose  TWICE DAILY DX: E11.9   100 each   2   . hydrochlorothiazide (HYDRODIURIL) 25 MG tablet      TAKE 1 TABLET BY MOUTH ONCE A DAY   90 tablet   0   . hydrochlorothiazide (HYDRODIURIL) 25 MG tablet      TAKE 1 TABLET BY MOUTH ONCE A DAY   30 tablet   0   . Insulin Pen Needle (SURE COMFORT PEN NEEDLES) 31G X 5 MM MISC      As directed DX: E11.9   90 each   2     QS: 30 DAY SUPPLY. PATIENT NEEDS APPOINTMENT FOR F ...   . latanoprost (XALATAN) 0.005 % ophthalmic solution      PLACE 1 DROP INTO BOTH EYES EVERY NIGHT AT BEDTIME      4   . LEVEMIR FLEXTOUCH 100 UNIT/ML Pen      INJECT 20 UNITS SUBQ DAILY   100 mL   5     QS 30 day supply, DX: E11.9   . meloxicam (MOBIC) 7.5 MG tablet   Oral   Take 7.5 mg by mouth daily. with food      2   . metoprolol tartrate (LOPRESSOR) 25 MG tablet      TAKE 1/2 TABLET BY MOUTH DAILY   45 tablet   4   . pioglitazone (ACTOS) 45 MG tablet      TAKE 1 TABLET BY MOUTH DAILY   90 tablet   3   . prednisoLONE acetate (PRED FORTE) 1 % ophthalmic suspension      PLACE 1 DROP IN LEFT EYE 4 TIMES A DAY      4   . SIMBRINZA 1-0.2 % SUSP      INSTILL 1 DROP INTO BOTH EYES TWICE A DAY      4     Dispense as written.   . travoprost, benzalkonium, (TRAVATAN) 0.004 % ophthalmic solution   Both Eyes   Place 1 drop into both eyes at bedtime.           Allergies Sulfa antibiotics  Family History  Problem Relation Age of Onset  . Diabetes Sister   . Hypertension Mother     Social History Social History  Substance Use Topics  . Smoking status: Never Smoker   . Smokeless tobacco: Never Used  . Alcohol Use: No    Review of Systems Constitutional: No fever/chills Eyes: No visual changes. ENT: No sore throat. Cardiovascular: Denies chest pain. Respiratory: Denies shortness of breath. Gastrointestinal: No abdominal pain.  No nausea, no vomiting.  No diarrhea.  No constipation. Genitourinary: Negative for  dysuria. Musculoskeletal: Pain in R hip with movement Skin: Negative for rash. Neurological: Negative for headaches, focal weakness or numbness.  10-point ROS otherwise negative.  ____________________________________________   PHYSICAL EXAM:  VITAL SIGNS: ED Triage Vitals  Enc Vitals Group     BP 01/17/16 1115 145/58 mmHg     Pulse Rate 01/17/16 1115 71     Resp 01/17/16 1115 18     Temp 01/17/16 1115 99 F (37.2 C)     Temp Source 01/17/16 1115 Oral     SpO2 01/17/16 1115 100 %     Weight 01/17/16 1115 143 lb (64.864 kg)     Height 01/17/16 1115 5\' 3"  (1.6 m)     Head Cir --      Peak Flow --      Pain Score 01/17/16 1118 10     Pain Loc --      Pain Edu? --      Excl. in [place]? --     Constitutional: Alert and oriented. Well appearing and in no acute distress. Eyes: Conjunctivae are normal. PERRL. EOMI. Head: Atraumatic. Nose: No congestion/rhinnorhea. Mouth/Throat: Mucous membranes are moist.  Oropharynx non-erythematous. Neck: No stridor.  No meningeal signs.  No cervical spine tenderness to palpation. Cardiovascular: Normal rate, regular rhythm. Good peripheral circulation. Grossly normal heart sounds.   Respiratory: Normal respiratory effort.  No retractions. Lungs CTAB. Gastrointestinal: Soft and nontender. No distention.  Musculoskeletal: Tenderness to palpation of the proximal femur of the right lower leg but without obvious gross deformity.  The leg is not internally or externally rotated or shortened.  She is vascularly intact down to the foot.  She is able to wiggle her toes and apply plantar pressure without discomfort Neurologic:  Normal speech and language. No gross focal neurologic deficits are appreciated.  Skin:  Skin is warm, dry and intact. No rash noted. Psychiatric: Mood and affect are normal. Speech and behavior are normal.  ____________________________________________   LABS (all labs ordered are listed, but only abnormal results are  displayed)  Labs Reviewed - No data to display ____________________________________________  EKG  None ____________________________________________  RADIOLOGY   Ct Pelvis Wo Contrast  01/17/2016  CLINICAL DATA:  Right hip and pelvic pain since falling yesterday. History of total hip arthroplasty. EXAM: CT PELVIS WITHOUT CONTRAST TECHNIQUE: Multidetector CT imaging of the pelvis was performed following the standard protocol without intravenous contrast. The right hip prosthesis is incompletely visualized. COMPARISON:  Pelvic and right hip radiographs 01/17/2016. FINDINGS: Bones: The bones are diffusely demineralized. There is no evidence of acute fracture or dislocation. The sacroiliac joints are intact there are mild degenerative changes within the lower lumbar spine. Patient is status post right total hip arthroplasty. The femoral prosthesis is incompletely visualized. 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 This osseous expansion has sclerotic margins and measures up to 4.0 cm transverse and 7.4 cm cephalocaudad. Joint/cartilage: Status post right total hip arthroplasty. As above, there is chronic osteolysis of the acetabulum with associated acetabular protrusio, but no evidence of hardware migration. There is a right hip joint effusion with anterior extension of a large fluid collection, measuring up to 9.8 x 7.2 cm transverse on image 79. This extends inferiorly into the right thigh, measuring at least 12 cm cephalocaudad, incompletely visualized. This collection appears complex, likely pseudo tumor. The left hip demonstrates mild degenerative changes. Ligaments: Not applicable for exam/indication. Tendons/muscles: There is asymmetric  atrophy of the right pelvic and proximal thigh musculature. Neurovascular/other soft tissues: There are diffuse vascular calcifications. No large hematoma or soft tissue  inflammatory changes are demonstrated. IMPRESSION: 1. No evidence of acute pelvic fracture or dislocation. 2. The right femoral prosthesis is incompletely visualized status post right total hip arthroplasty. 3. 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 Orthopedic referral for follow-up recommended. Electronically Signed   By: Richardean Sale M.D.   On: 01/17/2016 13:57   Dg Hip Unilat With Pelvis 2-3 Views Right  01/17/2016  CLINICAL DATA:  Fall yesterday. Right hip injury and pain. Unable to bear weight. Previous right hip replacement. Initial encounter. EXAM: DG HIP (WITH OR WITHOUT PELVIS) 2-3V RIGHT COMPARISON:  None. FINDINGS: Bipolar right hip prosthesis seen in appropriate position. Diffuse osteopenia noted, however there is no evidence of acute fracture or dislocation. Chronic right protrusio acetabula noted. Heterotopic soft tissue ossification seen lateral to the right hip prosthesis. No pelvic fracture identified. Severe left hip osteoarthritis noted. Peripheral vascular calcification also demonstrated. IMPRESSION: Bipolar right hip prosthesis. No evidence of acute fracture or dislocation. Diffuse osteopenia and severe left hip osteoarthritis. Electronically Signed   By: Earle Gell M.D.   On: 01/17/2016 12:15    ____________________________________________   PROCEDURES  Procedure(s) performed: None  Critical Care performed: No ____________________________________________   INITIAL IMPRESSION / ASSESSMENT AND PLAN / ED COURSE  Pertinent labs & imaging results that were available during my care of the patient were reviewed by me and considered in my medical decision making (see chart for details).  The patient's x-ray was unremarkable but I was concerned about an occult fracture site obtained a CT noncontrast of the pelvis at the recommendation of  the radiologist (given her prior hip prosthesis).  There is no evidence of acute injury on the CT scan either.  She was able to ambulate with her walker with no additional assistance with no pain or tenderness.  I discussed all this with the family and the patient and she is ready to go home.  I gave my usual and customary return precautions.   ____________________________________________  FINAL CLINICAL IMPRESSION(S) / ED DIAGNOSES  Final diagnoses:  Fall, initial encounter  Hip pain, acute, left     MEDICATIONS GIVEN DURING THIS VISIT:  Medications - No data to display   NEW OUTPATIENT MEDICATIONS STARTED DURING THIS VISIT:  Discharge Medication List as of 01/17/2016  4:28 PM        Note:  This document was prepared using Dragon voice recognition software and may include unintentional dictation errors.   Hinda Kehr, MD 01/17/16 [phone]

## 2016-01-20 DIAGNOSIS — Z96641 Presence of right artificial hip joint: Secondary | ICD-10-CM | POA: Insufficient documentation

## 2016-01-20 DIAGNOSIS — M1711 Unilateral primary osteoarthritis, right knee: Secondary | ICD-10-CM | POA: Diagnosis not present

## 2016-01-20 DIAGNOSIS — S7001XA Contusion of right hip, initial encounter: Secondary | ICD-10-CM | POA: Diagnosis not present

## 2016-01-22 DIAGNOSIS — Z7982 Long term (current) use of aspirin: Secondary | ICD-10-CM | POA: Diagnosis not present

## 2016-01-22 DIAGNOSIS — E119 Type 2 diabetes mellitus without complications: Secondary | ICD-10-CM | POA: Diagnosis not present

## 2016-01-22 DIAGNOSIS — Z742 Need for assistance at home and no other household member able to render care: Secondary | ICD-10-CM | POA: Diagnosis not present

## 2016-01-22 DIAGNOSIS — Z794 Long term (current) use of insulin: Secondary | ICD-10-CM | POA: Diagnosis not present

## 2016-01-22 DIAGNOSIS — Z9181 History of falling: Secondary | ICD-10-CM | POA: Diagnosis not present

## 2016-01-22 DIAGNOSIS — I10 Essential (primary) hypertension: Secondary | ICD-10-CM | POA: Diagnosis not present

## 2016-01-22 DIAGNOSIS — M1711 Unilateral primary osteoarthritis, right knee: Secondary | ICD-10-CM | POA: Diagnosis not present

## 2016-01-22 DIAGNOSIS — M25551 Pain in right hip: Secondary | ICD-10-CM | POA: Diagnosis not present

## 2016-01-22 DIAGNOSIS — Z96641 Presence of right artificial hip joint: Secondary | ICD-10-CM | POA: Diagnosis not present

## 2016-01-26 DIAGNOSIS — M25551 Pain in right hip: Secondary | ICD-10-CM | POA: Diagnosis not present

## 2016-01-26 DIAGNOSIS — I10 Essential (primary) hypertension: Secondary | ICD-10-CM | POA: Diagnosis not present

## 2016-01-26 DIAGNOSIS — Z9181 History of falling: Secondary | ICD-10-CM | POA: Diagnosis not present

## 2016-01-26 DIAGNOSIS — M1711 Unilateral primary osteoarthritis, right knee: Secondary | ICD-10-CM | POA: Diagnosis not present

## 2016-01-26 DIAGNOSIS — Z96641 Presence of right artificial hip joint: Secondary | ICD-10-CM | POA: Diagnosis not present

## 2016-01-26 DIAGNOSIS — E119 Type 2 diabetes mellitus without complications: Secondary | ICD-10-CM | POA: Diagnosis not present

## 2016-01-28 DIAGNOSIS — Z9181 History of falling: Secondary | ICD-10-CM | POA: Diagnosis not present

## 2016-01-28 DIAGNOSIS — M25551 Pain in right hip: Secondary | ICD-10-CM | POA: Diagnosis not present

## 2016-01-28 DIAGNOSIS — E119 Type 2 diabetes mellitus without complications: Secondary | ICD-10-CM | POA: Diagnosis not present

## 2016-01-28 DIAGNOSIS — I10 Essential (primary) hypertension: Secondary | ICD-10-CM | POA: Diagnosis not present

## 2016-01-28 DIAGNOSIS — M1711 Unilateral primary osteoarthritis, right knee: Secondary | ICD-10-CM | POA: Diagnosis not present

## 2016-01-28 DIAGNOSIS — Z96641 Presence of right artificial hip joint: Secondary | ICD-10-CM | POA: Diagnosis not present

## 2016-02-02 DIAGNOSIS — Z96641 Presence of right artificial hip joint: Secondary | ICD-10-CM | POA: Diagnosis not present

## 2016-02-02 DIAGNOSIS — E119 Type 2 diabetes mellitus without complications: Secondary | ICD-10-CM | POA: Diagnosis not present

## 2016-02-02 DIAGNOSIS — M25551 Pain in right hip: Secondary | ICD-10-CM | POA: Diagnosis not present

## 2016-02-02 DIAGNOSIS — M1711 Unilateral primary osteoarthritis, right knee: Secondary | ICD-10-CM | POA: Diagnosis not present

## 2016-02-02 DIAGNOSIS — Z9181 History of falling: Secondary | ICD-10-CM | POA: Diagnosis not present

## 2016-02-02 DIAGNOSIS — I10 Essential (primary) hypertension: Secondary | ICD-10-CM | POA: Diagnosis not present

## 2016-02-04 DIAGNOSIS — I10 Essential (primary) hypertension: Secondary | ICD-10-CM | POA: Diagnosis not present

## 2016-02-04 DIAGNOSIS — M25551 Pain in right hip: Secondary | ICD-10-CM | POA: Diagnosis not present

## 2016-02-04 DIAGNOSIS — M1711 Unilateral primary osteoarthritis, right knee: Secondary | ICD-10-CM | POA: Diagnosis not present

## 2016-02-04 DIAGNOSIS — E119 Type 2 diabetes mellitus without complications: Secondary | ICD-10-CM | POA: Diagnosis not present

## 2016-02-04 DIAGNOSIS — Z96641 Presence of right artificial hip joint: Secondary | ICD-10-CM | POA: Diagnosis not present

## 2016-02-04 DIAGNOSIS — Z9181 History of falling: Secondary | ICD-10-CM | POA: Diagnosis not present

## 2016-02-09 DIAGNOSIS — E119 Type 2 diabetes mellitus without complications: Secondary | ICD-10-CM | POA: Diagnosis not present

## 2016-02-09 DIAGNOSIS — M1711 Unilateral primary osteoarthritis, right knee: Secondary | ICD-10-CM | POA: Diagnosis not present

## 2016-02-09 DIAGNOSIS — Z9181 History of falling: Secondary | ICD-10-CM | POA: Diagnosis not present

## 2016-02-09 DIAGNOSIS — M25551 Pain in right hip: Secondary | ICD-10-CM | POA: Diagnosis not present

## 2016-02-09 DIAGNOSIS — I10 Essential (primary) hypertension: Secondary | ICD-10-CM | POA: Diagnosis not present

## 2016-02-09 DIAGNOSIS — Z96641 Presence of right artificial hip joint: Secondary | ICD-10-CM | POA: Diagnosis not present

## 2016-02-11 DIAGNOSIS — M25551 Pain in right hip: Secondary | ICD-10-CM | POA: Diagnosis not present

## 2016-02-11 DIAGNOSIS — M1711 Unilateral primary osteoarthritis, right knee: Secondary | ICD-10-CM | POA: Diagnosis not present

## 2016-02-11 DIAGNOSIS — I10 Essential (primary) hypertension: Secondary | ICD-10-CM | POA: Diagnosis not present

## 2016-02-11 DIAGNOSIS — E119 Type 2 diabetes mellitus without complications: Secondary | ICD-10-CM | POA: Diagnosis not present

## 2016-02-11 DIAGNOSIS — Z96641 Presence of right artificial hip joint: Secondary | ICD-10-CM | POA: Diagnosis not present

## 2016-02-11 DIAGNOSIS — Z9181 History of falling: Secondary | ICD-10-CM | POA: Diagnosis not present

## 2016-02-19 DIAGNOSIS — Z9181 History of falling: Secondary | ICD-10-CM | POA: Diagnosis not present

## 2016-02-19 DIAGNOSIS — Z96641 Presence of right artificial hip joint: Secondary | ICD-10-CM | POA: Diagnosis not present

## 2016-02-19 DIAGNOSIS — E119 Type 2 diabetes mellitus without complications: Secondary | ICD-10-CM | POA: Diagnosis not present

## 2016-02-19 DIAGNOSIS — I10 Essential (primary) hypertension: Secondary | ICD-10-CM | POA: Diagnosis not present

## 2016-02-19 DIAGNOSIS — M1711 Unilateral primary osteoarthritis, right knee: Secondary | ICD-10-CM | POA: Diagnosis not present

## 2016-02-19 DIAGNOSIS — M25551 Pain in right hip: Secondary | ICD-10-CM | POA: Diagnosis not present

## 2016-02-26 DIAGNOSIS — Z96641 Presence of right artificial hip joint: Secondary | ICD-10-CM | POA: Diagnosis not present

## 2016-02-26 DIAGNOSIS — Z9181 History of falling: Secondary | ICD-10-CM | POA: Diagnosis not present

## 2016-02-26 DIAGNOSIS — I10 Essential (primary) hypertension: Secondary | ICD-10-CM | POA: Diagnosis not present

## 2016-02-26 DIAGNOSIS — M25551 Pain in right hip: Secondary | ICD-10-CM | POA: Diagnosis not present

## 2016-02-26 DIAGNOSIS — M1711 Unilateral primary osteoarthritis, right knee: Secondary | ICD-10-CM | POA: Diagnosis not present

## 2016-02-26 DIAGNOSIS — E119 Type 2 diabetes mellitus without complications: Secondary | ICD-10-CM | POA: Diagnosis not present

## 2016-03-09 ENCOUNTER — Other Ambulatory Visit: Payer: Self-pay | Admitting: Family Medicine

## 2016-03-09 DIAGNOSIS — Z96641 Presence of right artificial hip joint: Secondary | ICD-10-CM | POA: Diagnosis not present

## 2016-03-09 DIAGNOSIS — M1711 Unilateral primary osteoarthritis, right knee: Secondary | ICD-10-CM | POA: Diagnosis not present

## 2016-03-09 DIAGNOSIS — S7001XA Contusion of right hip, initial encounter: Secondary | ICD-10-CM | POA: Diagnosis not present

## 2016-03-10 ENCOUNTER — Telehealth: Payer: Self-pay | Admitting: Family Medicine

## 2016-03-10 NOTE — Telephone Encounter (Signed)
Please review, last visit was in February for acute visit-aa

## 2016-03-10 NOTE — Telephone Encounter (Signed)
Pt requesting a refill on the following medication,pt states she is completely out of her medication.  pt use's [facility]. Thanks CC    hydrochlorothiazide (HYDRODIURIL) 25 MG tablet

## 2016-03-10 NOTE — Telephone Encounter (Signed)
#  30,1rf--needs appt for further rf.

## 2016-03-15 ENCOUNTER — Other Ambulatory Visit: Payer: Self-pay | Admitting: Family Medicine

## 2016-03-15 DIAGNOSIS — I10 Essential (primary) hypertension: Secondary | ICD-10-CM

## 2016-04-12 ENCOUNTER — Other Ambulatory Visit: Payer: Self-pay | Admitting: Family Medicine

## 2016-05-09 ENCOUNTER — Ambulatory Visit (INDEPENDENT_AMBULATORY_CARE_PROVIDER_SITE_OTHER): Payer: Medicare Other | Admitting: Family Medicine

## 2016-05-09 ENCOUNTER — Encounter: Payer: Self-pay | Admitting: Family Medicine

## 2016-05-09 VITALS — BP 178/78 | HR 72 | Temp 98.4°F | Resp 14 | Ht 63.25 in | Wt 145.0 lb

## 2016-05-09 DIAGNOSIS — E119 Type 2 diabetes mellitus without complications: Secondary | ICD-10-CM

## 2016-05-09 DIAGNOSIS — M79604 Pain in right leg: Secondary | ICD-10-CM | POA: Diagnosis not present

## 2016-05-09 DIAGNOSIS — Z794 Long term (current) use of insulin: Secondary | ICD-10-CM | POA: Diagnosis not present

## 2016-05-09 DIAGNOSIS — I10 Essential (primary) hypertension: Secondary | ICD-10-CM

## 2016-05-09 DIAGNOSIS — E785 Hyperlipidemia, unspecified: Secondary | ICD-10-CM

## 2016-05-09 DIAGNOSIS — Z0000 Encounter for general adult medical examination without abnormal findings: Secondary | ICD-10-CM

## 2016-05-09 DIAGNOSIS — Z23 Encounter for immunization: Secondary | ICD-10-CM

## 2016-05-09 NOTE — Progress Notes (Signed)
[name], Female    DOB: 06-10-1926, 82 y.o.   MRN: [id number] Visit Date: 05/09/2016  Today's Provider: Wilhemena Durie, MD   Chief Complaint  Patient presents with  . Medicare Wellness   Subjective:   Gabriela [name] is a 82 y.o. female who presents today for her Subsequent Annual Wellness Visit. She feels fairly well. She reports exercising no. She reports she is sleeping well. She has no symptoms of her CAD. No chest pain or shortness of breath. Is having more difficulty with ambulation as she ages due to arthritic issues and unsteady gait. She states she takes her medicine as directed. Not had labs in some time. Her sugars are been fine. Immunization History  Administered Date(s) Administered  . Influenza, High Dose Seasonal PF 05/06/2015  . Pneumococcal Conjugate-13 03/11/2014  . Pneumococcal Polysaccharide-23 07/27/2005  . Td 11/10/1994  . Tdap 03/05/2013  . Zoster 03/05/2013   Last Colonoscopy 02/14/08 internal hemorrhoids  Mammogram 12/12/07  BMD 09/13/05  Pap smear 11/10/1994 Review of Systems  Constitutional: Positive for fatigue.  HENT: Negative.   Eyes: Negative.   Respiratory: Positive for shortness of breath. Negative for apnea, choking, chest tightness and wheezing.   Cardiovascular: Negative.   Gastrointestinal: Negative.   Endocrine: Negative.   Genitourinary: Negative.   Musculoskeletal: Positive for arthralgias and gait problem.  Skin: Negative.   Allergic/Immunologic: Negative.   Neurological: Positive for weakness (in legs, using a walker).  Hematological: Negative.   Psychiatric/Behavioral: Negative.     Patient Active Problem List   Diagnosis Date Noted  . Abnormal LFTs (liver function tests) 04/17/2015  . Anemia of diabetes 04/17/2015  . Atherosclerosis of coronary artery 04/17/2015  . Diabetic kidney ([place]) 04/17/2015  . Essential (primary) hypertension 04/17/2015  . Glaucoma 04/17/2015  . HLD (hyperlipidemia) 04/17/2015  .  Disorder of kidney 04/17/2015  . Arthritis, degenerative 04/17/2015  . Arthritis, hip 04/26/2013  . Left main coronary artery disease 04/26/2013  . Ischemic mitral regurgitation 04/26/2013  . Hypertension   . Diabetes mellitus ([place])   . Hyperlipidemia     Social History   Social History  . Marital status: Widowed    Spouse name: N/A  . Number of children: 7  . Years of education: N/A   Occupational History  . retired    Social History Main Topics  . Smoking status: Never Smoker  . Smokeless tobacco: Never Used  . Alcohol use No  . Drug use: No  . Sexual activity: No   Other Topics Concern  . Not on file   Social History Narrative  . No narrative on file    Past Surgical History:  Procedure Laterality Date  . CARDIAC CATHETERIZATION  04/22/13  . CHOLECYSTECTOMY    . CORONARY ARTERY BYPASS GRAFT N/A 05/31/2013   Procedure: Coronary Artery Bypass Grafting Times Three Using Left Internal Mammary Artery and Right Saphenous Leg Vein Harvested Endoscopically;  Surgeon: Ivin Poot, MD;  Location: [facility];  Service: Open Heart Surgery;  Laterality: N/A;  . HIP SURGERY     right  . INTRAOPERATIVE TRANSESOPHAGEAL ECHOCARDIOGRAM N/A 05/31/2013   Procedure: INTRAOPERATIVE TRANSESOPHAGEAL ECHOCARDIOGRAM;  Surgeon: Ivin Poot, MD;  Location: [facility];  Service: Open Heart Surgery;  Laterality: N/A;  . JOINT REPLACEMENT    . MITRAL VALVE REPAIR N/A 05/31/2013   Procedure: MITRAL VALVE REPAIR (MVR);  Surgeon: Ivin Poot, MD;  Location: [facility];  Service: Open Heart Surgery;  Laterality: N/A;  . TONSILLECTOMY  Her family history includes Diabetes in her sister; Hypertension in her mother.    Outpatient Encounter Prescriptions as of 05/09/2016  Medication Sig Note  . amoxicillin (AMOXIL) 500 MG capsule Take 1 capsule (500 mg total) by mouth 3 (three) times daily.   Marland Kitchen aspirin EC 325 MG EC tablet Take 1 tablet (325 mg total) by mouth daily.   Marland Kitchen BAYER CONTOUR NEXT TEST  test strip CHECK SUGAR TWICE DAILY. DX: E11.9   . brompheniramine-pseudoephedrine-DM 30-2-10 MG/5ML syrup Take 5 mLs by mouth 3 (three) times daily as needed.   . gabapentin (NEURONTIN) 100 MG capsule TAKE ONE CAPSULE BY MOUTH TWICE A DAY   . glucose blood (ACCU-CHEK AVIVA PLUS) test strip Test glucose TWICE DAILY DX: E11.9   . hydrochlorothiazide (HYDRODIURIL) 25 MG tablet TAKE 1 TABLET BY MOUTH ONCE A DAY   . hydrochlorothiazide (HYDRODIURIL) 25 MG tablet TAKE 1 TABLET BY MOUTH ONCE A DAY   . Insulin Pen Needle (SURE COMFORT PEN NEEDLES) 31G X 5 MM MISC As directed DX: E11.9   . latanoprost (XALATAN) 0.005 % ophthalmic solution PLACE 1 DROP INTO BOTH EYES EVERY NIGHT AT BEDTIME 10/02/2015: Received from: External Pharmacy  . LEVEMIR FLEXTOUCH 100 UNIT/ML Pen INJECT 20 UNITS SUBQ DAILY   . meloxicam (MOBIC) 7.5 MG tablet Take 7.5 mg by mouth daily. with food 05/06/2015: Received from: External Pharmacy  . metoprolol tartrate (LOPRESSOR) 25 MG tablet TAKE 1/2 TABLET BY MOUTH DAILY   . pioglitazone (ACTOS) 45 MG tablet TAKE 1 TABLET BY MOUTH DAILY   . prednisoLONE acetate (PRED FORTE) 1 % ophthalmic suspension PLACE 1 DROP IN LEFT EYE 4 TIMES A DAY 05/06/2015: Received from: External Pharmacy  . SIMBRINZA 1-0.2 % SUSP INSTILL 1 DROP INTO BOTH EYES TWICE A DAY 05/06/2015: Received from: External Pharmacy  . travoprost, benzalkonium, (TRAVATAN) 0.004 % ophthalmic solution Place 1 drop into both eyes at bedtime.    No facility-administered encounter medications on file as of 05/09/2016.     Allergies  Allergen Reactions  . Sulfa Antibiotics Rash    Patient Care Team: Jerrol Banana., MD as PCP - General (Family Medicine) Corey Skains, MD as Referring Physician (Internal Medicine)  Objective:   Vitals:  Vitals:   05/09/16 1359  BP: (!) 178/78  Pulse: 72  Resp: 14  Temp: 98.4 F (36.9 C)  Weight: 145 lb (65.8 kg)  Height: 5' 3.25" (1.607 m)    Physical Exam  Constitutional:  She is oriented to person, place, and time. She appears well-developed and well-nourished.  HENT:  Head: Normocephalic and atraumatic.  Right Ear: External ear normal.  Left Ear: External ear normal.  Nose: Nose normal.  Eyes: Conjunctivae are normal. Pupils are equal, round, and reactive to light.  Neck: Neck supple. No thyromegaly present.  Cardiovascular: Normal rate, regular rhythm, normal heart sounds and intact distal pulses.   Pulmonary/Chest: Effort normal and breath sounds normal. No respiratory distress. She has no wheezes.  Abdominal: Soft. There is no tenderness.  Musculoskeletal:       Right hip: She exhibits decreased range of motion, decreased strength and tenderness.  Figure 4 positive  Lymphadenopathy:    She has no cervical adenopathy.  Neurological: She is alert and oriented to person, place, and time. Coordination (due to right leg pain) abnormal.  Using a walker today  Skin: Skin is warm and dry. No rash noted. No erythema.  Psychiatric: She has a normal mood and affect. Her behavior is normal.  Activities of Daily Living In your present state of health, do you have any difficulty performing the following activities: 05/09/2016  Hearing? N  Vision? N  Difficulty concentrating or making decisions? N  Walking or climbing stairs? Y  Dressing or bathing? N  Doing errands, shopping? N  Some recent data might be hidden    Fall Risk Assessment Fall Risk  05/09/2016 05/06/2015  Falls in the past year? Yes No  Number falls in past yr: 1 -  Injury with Fall? Yes -  Follow up Falls evaluation completed -     Depression Screen PHQ 2/9 Scores 05/06/2015  PHQ - 2 Score 0    Cognitive Testing - 6-CIT    Year: 0 4 points  Month: 0 3 points  Memorize "Pia Mau, [address], [place]"  Time (within 1 hour:) 0 3 points  Count backwards from 20: 0 2 4 points  Name months of year: 0 2 4 points  Repeat Address: 0 2 4 6 8 10  points   Total Score:  12/28  Interpretation : Normal (0-7) Abnormal (8-28)    Assessment & Plan:     Annual Wellness Visit  Reviewed patient's Family Medical History Reviewed and updated list of patient's medical providers Assessment of cognitive impairment was done Assessed patient's functional ability Established a written schedule for health screening [place] Completed and Reviewed 1. Medicare annual wellness visit, subsequent  2. Need for influenza vaccination - Flu vaccine HIGH DOSE PF (Fluzone High dose)  3. Pain of right lower extremity Refer to Dr Sabra Heck to discuss options for this issue. I am worried fall risk for this Allayne Stack is increased with the pain she is having. - Ambulatory referral to Orthopedic Surgery - TSH  4. Essential hypertension - CBC w/Diff/Platelet - Comprehensive metabolic panel - TSH  5. Type 2 diabetes mellitus without complication, with long-term current use of insulin (HCC) - HgB A1c  6. HLD (hyperlipidemia) - Lipid Panel With LDL/HDL Ratio  HPI, Exam and A&P transcribed under direction and in the presence of Miguel Aschoff, MD. I have done the exam and reviewed the chart and it is accurate to the best of my knowledge. Miguel Aschoff M.D. [facility]

## 2016-05-10 ENCOUNTER — Telehealth: Payer: Self-pay

## 2016-05-10 DIAGNOSIS — N189 Chronic kidney disease, unspecified: Secondary | ICD-10-CM

## 2016-05-10 LAB — COMPREHENSIVE METABOLIC PANEL
ALK PHOS: 111 IU/L (ref 39–117)
ALT: 8 IU/L (ref 0–32)
AST: 16 IU/L (ref 0–40)
Albumin/Globulin Ratio: 1 — ABNORMAL LOW (ref 1.2–2.2)
Albumin: 4.1 g/dL (ref 3.5–4.7)
BUN/Creatinine Ratio: 27 (ref 12–28)
BUN: 44 mg/dL — AB (ref 8–27)
Bilirubin Total: 0.4 mg/dL (ref 0.0–1.2)
CALCIUM: 9 mg/dL (ref 8.7–10.3)
CHLORIDE: 105 mmol/L (ref 96–106)
CO2: 18 mmol/L (ref 18–29)
CREATININE: 1.66 mg/dL — AB (ref 0.57–1.00)
GFR calc Af Amer: 32 mL/min/{1.73_m2} — ABNORMAL LOW (ref >59)
GFR, EST NON AFRICAN AMERICAN: 28 mL/min/{1.73_m2} — AB (ref >59)
GLUCOSE: 130 mg/dL — AB (ref 65–99)
Globulin, Total: 4.3 g/dL (ref 1.5–4.5)
POTASSIUM: 5.1 mmol/L (ref 3.5–5.2)
Sodium: 143 mmol/L (ref 134–144)
Total Protein: 8.4 g/dL (ref 6.0–8.5)

## 2016-05-10 LAB — CBC WITH DIFFERENTIAL/PLATELET
BASOS ABS: 0 10*3/uL (ref 0.0–0.2)
Basos: 0 %
EOS (ABSOLUTE): 0.1 10*3/uL (ref 0.0–0.4)
EOS: 1 %
HEMOGLOBIN: 8.4 g/dL — AB (ref 11.1–15.9)
Hematocrit: 27 % — ABNORMAL LOW (ref 34.0–46.6)
IMMATURE GRANULOCYTES: 0 %
Immature Grans (Abs): 0 10*3/uL (ref 0.0–0.1)
LYMPHS ABS: 3.3 10*3/uL — AB (ref 0.7–3.1)
Lymphs: 43 %
MCH: 27.8 pg (ref 26.6–33.0)
MCHC: 31.1 g/dL — AB (ref 31.5–35.7)
MCV: 89 fL (ref 79–97)
MONOCYTES: 11 %
MONOS ABS: 0.8 10*3/uL (ref 0.1–0.9)
NEUTROS PCT: 45 %
Neutrophils Absolute: 3.5 10*3/uL (ref 1.4–7.0)
Platelets: 200 10*3/uL (ref 150–379)
RBC: 3.02 x10E6/uL — AB (ref 3.77–5.28)
RDW: 14.5 % (ref 12.3–15.4)
WBC: 7.7 10*3/uL (ref 3.4–10.8)

## 2016-05-10 LAB — LIPID PANEL WITH LDL/HDL RATIO
Cholesterol, Total: 111 mg/dL (ref 100–199)
HDL: 40 mg/dL (ref >39)
LDL Calculated: 52 mg/dL (ref 0–99)
LDL/HDL RATIO: 1.3 ratio (ref 0.0–3.2)
Triglycerides: 96 mg/dL (ref 0–149)
VLDL CHOLESTEROL CAL: 19 mg/dL (ref 5–40)

## 2016-05-10 LAB — TSH: TSH: 1.93 u[IU]/mL (ref 0.450–4.500)

## 2016-05-10 LAB — HEMOGLOBIN A1C
Est. average glucose Bld gHb Est-mCnc: 131 mg/dL
Hgb A1c MFr Bld: 6.2 % — ABNORMAL HIGH (ref 4.8–5.6)

## 2016-05-10 NOTE — Telephone Encounter (Signed)
-----   Message from Jerrol Banana., MD sent at 05/10/2016  8:03 AM EDT ----- Stable. Most likely anemia of chronic disease, especially chronic kidney disease. We'll refer to nephrology.

## 2016-05-10 NOTE — Telephone Encounter (Signed)
Advised patient of results. Order has been placed for nephrology referral.

## 2016-05-13 ENCOUNTER — Other Ambulatory Visit: Payer: Self-pay | Admitting: Family Medicine

## 2016-05-24 DIAGNOSIS — H40153 Residual stage of open-angle glaucoma, bilateral: Secondary | ICD-10-CM | POA: Diagnosis not present

## 2016-06-06 DIAGNOSIS — N390 Urinary tract infection, site not specified: Secondary | ICD-10-CM | POA: Diagnosis not present

## 2016-06-06 DIAGNOSIS — E1122 Type 2 diabetes mellitus with diabetic chronic kidney disease: Secondary | ICD-10-CM | POA: Diagnosis not present

## 2016-06-06 DIAGNOSIS — R601 Generalized edema: Secondary | ICD-10-CM | POA: Diagnosis not present

## 2016-06-06 DIAGNOSIS — R600 Localized edema: Secondary | ICD-10-CM | POA: Diagnosis not present

## 2016-06-06 DIAGNOSIS — N183 Chronic kidney disease, stage 3 (moderate): Secondary | ICD-10-CM | POA: Diagnosis not present

## 2016-06-06 DIAGNOSIS — I10 Essential (primary) hypertension: Secondary | ICD-10-CM | POA: Diagnosis not present

## 2016-06-13 ENCOUNTER — Other Ambulatory Visit: Payer: Self-pay | Admitting: Family Medicine

## 2016-06-21 ENCOUNTER — Other Ambulatory Visit: Payer: Self-pay

## 2016-06-21 MED ORDER — GLUCOSE BLOOD VI STRP: ORAL_STRIP | 12 refills | Status: DC

## 2016-07-13 ENCOUNTER — Other Ambulatory Visit: Payer: Self-pay | Admitting: Family Medicine

## 2016-07-13 DIAGNOSIS — I10 Essential (primary) hypertension: Secondary | ICD-10-CM

## 2016-08-22 ENCOUNTER — Other Ambulatory Visit: Payer: Self-pay | Admitting: Family Medicine

## 2016-09-26 ENCOUNTER — Other Ambulatory Visit: Payer: Self-pay | Admitting: Family Medicine

## 2016-09-27 ENCOUNTER — Other Ambulatory Visit: Payer: Self-pay | Admitting: Emergency Medicine

## 2016-09-27 ENCOUNTER — Other Ambulatory Visit: Payer: Self-pay | Admitting: Family Medicine

## 2016-09-27 DIAGNOSIS — E118 Type 2 diabetes mellitus with unspecified complications: Secondary | ICD-10-CM

## 2016-09-27 DIAGNOSIS — E119 Type 2 diabetes mellitus without complications: Secondary | ICD-10-CM

## 2016-09-27 DIAGNOSIS — Z794 Long term (current) use of insulin: Principal | ICD-10-CM

## 2016-09-27 MED ORDER — GLUCOSE BLOOD VI STRP: ORAL_STRIP | 12 refills | Status: DC

## 2016-09-27 MED ORDER — GLUCOSE BLOOD VI STRP: ORAL_STRIP | 2 refills | Status: DC

## 2016-09-27 MED ORDER — ACCU-CHEK AVIVA PLUS W/DEVICE KIT: 1.0000 | PACK | Freq: Every day | 0 refills | Status: DC

## 2016-09-27 NOTE — Telephone Encounter (Signed)
Strips sent in. Pt informed.

## 2016-09-27 NOTE — Telephone Encounter (Signed)
Pt requesting a refill of her strip sent into [facility] at [facility].  Pt has a Contour [employer] glucose machine.

## 2016-10-03 ENCOUNTER — Telehealth: Payer: Self-pay | Admitting: Family Medicine

## 2016-10-03 NOTE — Telephone Encounter (Signed)
Pt is requesting an order for a walker that has a seat and wheels. Pt is aware that Dr. Rosanna Randy is out of the office today. Please advise. Thanks TNP

## 2016-10-03 NOTE — Telephone Encounter (Signed)
Please advise. Thanks.  

## 2016-10-04 DIAGNOSIS — H40153 Residual stage of open-angle glaucoma, bilateral: Secondary | ICD-10-CM | POA: Diagnosis not present

## 2016-10-04 NOTE — Telephone Encounter (Signed)
Tried calling patient, and no answer. Will try again later.  

## 2016-10-04 NOTE — Telephone Encounter (Signed)
Requires face to face visit

## 2016-10-05 NOTE — Telephone Encounter (Signed)
Appt made ED

## 2016-10-11 ENCOUNTER — Ambulatory Visit: Payer: Medicare Other | Admitting: Family Medicine

## 2016-10-18 ENCOUNTER — Ambulatory Visit: Payer: Self-pay | Admitting: Family Medicine

## 2016-10-24 ENCOUNTER — Ambulatory Visit: Payer: Medicare Other | Admitting: Family Medicine

## 2016-11-01 ENCOUNTER — Ambulatory Visit (INDEPENDENT_AMBULATORY_CARE_PROVIDER_SITE_OTHER): Payer: Medicare Other | Admitting: Family Medicine

## 2016-11-01 VITALS — BP 142/52 | HR 68 | Temp 98.5°F | Resp 18 | Wt 142.0 lb

## 2016-11-01 DIAGNOSIS — R29898 Other symptoms and signs involving the musculoskeletal system: Secondary | ICD-10-CM

## 2016-11-01 DIAGNOSIS — R5383 Other fatigue: Secondary | ICD-10-CM | POA: Diagnosis not present

## 2016-11-01 DIAGNOSIS — J410 Simple chronic bronchitis: Secondary | ICD-10-CM

## 2016-11-01 MED ORDER — DOXYCYCLINE HYCLATE 100 MG PO TABS: 100.0000 mg | ORAL_TABLET | Freq: Two times a day (BID) | ORAL | 0 refills | Status: DC

## 2016-11-01 NOTE — Progress Notes (Signed)
Gabriela [name]  MRN: [id number] DOB: 03-10-1926  Subjective:  HPI Patient is here for face to face to get rollator with a seat.  Patient is using a walker, without it she can not ambulate due to leg weakness and also develops worsening of shortness of breath with walking certain distance like walking from one room to another. She takes small steps with walking and moves slower, does not rush.  Patient also states she has had cold symptoms for 1 week now. Symptoms are cough-with green mucus, chest congestion, shortness of breath, runny nose, fatigue, post nasal drip. No fever, no body aches. She has taking Robitussin.   Patient Active Problem List   Diagnosis Date Noted  . Abnormal LFTs (liver function tests) 04/17/2015  . Anemia of diabetes 04/17/2015  . Atherosclerosis of coronary artery 04/17/2015  . Diabetic kidney ([place]) 04/17/2015  . Essential (primary) hypertension 04/17/2015  . Glaucoma 04/17/2015  . HLD (hyperlipidemia) 04/17/2015  . Disorder of kidney 04/17/2015  . Arthritis, degenerative 04/17/2015  . Arthritis, hip 04/26/2013  . Left main coronary artery disease 04/26/2013  . Ischemic mitral regurgitation 04/26/2013  . Hypertension   . Diabetes mellitus ([place])   . Hyperlipidemia     Past Medical History:  Diagnosis Date  . Arthritis   . Diabetes mellitus ([place])   . Glaucoma   . Hyperlipidemia   . Hypertension   . Shortness of breath    with exertion    Social History   Social History  . Marital status: Widowed    Spouse name: N/A  . Number of children: 7  . Years of education: N/A   Occupational History  . retired    Social History Main Topics  . Smoking status: Never Smoker  . Smokeless tobacco: Never Used  . Alcohol use No  . Drug use: No  . Sexual activity: No   Other Topics Concern  . Not on file   Social History Narrative  . No narrative on file    Outpatient Encounter Prescriptions as of 11/01/2016  Medication Sig Note  . aspirin  EC 325 MG EC tablet Take 1 tablet (325 mg total) by mouth daily.   . brompheniramine-pseudoephedrine-DM 30-2-10 MG/5ML syrup Take 5 mLs by mouth 3 (three) times daily as needed.   . Blood Glucose Monitoring Suppl (ACCU-CHEK AVIVA PLUS) w/Device KIT 1 each by Does not apply route daily. Dx E11.9   . gabapentin (NEURONTIN) 100 MG capsule TAKE ONE CAPSULE BY MOUTH TWICE A DAY   . glucose blood (ACCU-CHEK AVIVA PLUS) test strip Test glucose TWICE DAILY DX: E11.9   . glucose blood (BAYER CONTOUR NEXT TEST) test strip Check sugar twice daily DX E11.9   . hydrochlorothiazide (HYDRODIURIL) 25 MG tablet TAKE 1 TABLET BY MOUTH ONCE A DAY   . latanoprost (XALATAN) 0.005 % ophthalmic solution PLACE 1 DROP INTO BOTH EYES EVERY NIGHT AT BEDTIME 10/02/2015: Received from: External Pharmacy  . LEVEMIR FLEXTOUCH 100 UNIT/ML Pen INJECT 20 UNITS SUBQ DAILY   . meloxicam (MOBIC) 7.5 MG tablet Take 7.5 mg by mouth daily. with food 05/06/2015: Received from: External Pharmacy  . metoprolol tartrate (LOPRESSOR) 25 MG tablet TAKE 1/2 TABLET BY MOUTH DAILY   . pioglitazone (ACTOS) 45 MG tablet TAKE 1 TABLET BY MOUTH DAILY   . prednisoLONE acetate (PRED FORTE) 1 % ophthalmic suspension PLACE 1 DROP IN LEFT EYE 4 TIMES A DAY 05/06/2015: Received from: External Pharmacy  . SIMBRINZA 1-0.2 % SUSP INSTILL 1 DROP INTO  BOTH EYES TWICE A DAY 05/06/2015: Received from: External Pharmacy  . SURE COMFORT PEN NEEDLES 31G X 5 MM MISC USE AS DIRECTED **PATIENT NEEDS APPOINTMENT FOR FOLLOW UP**   . travoprost, benzalkonium, (TRAVATAN) 0.004 % ophthalmic solution Place 1 drop into both eyes at bedtime.   . [DISCONTINUED] amoxicillin (AMOXIL) 500 MG capsule Take 1 capsule (500 mg total) by mouth 3 (three) times daily.   . [DISCONTINUED] hydrochlorothiazide (HYDRODIURIL) 25 MG tablet TAKE 1 TABLET BY MOUTH ONCE A DAY    No facility-administered encounter medications on file as of 11/01/2016.     Allergies  Allergen Reactions  . Sulfa  Antibiotics Rash    Review of Systems  Constitutional: Positive for malaise/fatigue.  HENT: Positive for congestion.        Post nasal drip, runny nose  Eyes: Negative.   Respiratory: Positive for cough, sputum production and shortness of breath.   Cardiovascular: Negative.   Gastrointestinal: Negative.   Musculoskeletal: Negative for back pain, falls, joint pain, myalgias and neck pain.  Neurological: Positive for weakness (leg weakness.).  Endo/Heme/Allergies: Negative.   Psychiatric/Behavioral: Negative.     Objective:  BP (!) 142/52   Pulse 68   Temp 98.5 F (36.9 C)   Resp 18   Wt 142 lb (64.4 kg)   SpO2 99%   BMI 24.96 kg/m   Physical Exam  Constitutional: She is oriented to person, place, and time and well-developed, well-nourished, and in no distress.  HENT:  Head: Normocephalic and atraumatic.  Right Ear: External ear normal.  Left Ear: External ear normal.  Nose: Nose normal.  Eyes: Conjunctivae are normal. Pupils are equal, round, and reactive to light.  Neck: Normal range of motion. Neck supple. No thyromegaly present.  Cardiovascular: Normal rate, regular rhythm, normal heart sounds and intact distal pulses.   No murmur heard. Pulmonary/Chest: Effort normal. No respiratory distress. She has wheezes (rhonchi expiratory wheezes).  Musculoskeletal:  Using a walker to ambulate  Neurological: She is alert and oriented to person, place, and time.  Skin: Skin is warm and dry.  Psychiatric: Mood, memory, affect and judgment normal.    Assessment and Plan :  1. Simple chronic bronchitis (HCC) Treat with Doxy. Follow as needed.  2. Leg weakness, bilateral Patient needs walker to ambulate due to weakness. Current walker is getting difficult for patient to ambulate with, her strength is decreasing.  HPI, Exam and A&P transcribed under direction and in the presence of Miguel Aschoff, MD. I have done the exam and reviewed the chart and it is accurate to the best  of my knowledge. Development worker, community has been used and  any errors in dictation or transcription are unintentional. Miguel Aschoff M.D. [facility]

## 2016-11-10 ENCOUNTER — Other Ambulatory Visit: Payer: Self-pay | Admitting: Family Medicine

## 2016-12-23 DIAGNOSIS — H903 Sensorineural hearing loss, bilateral: Secondary | ICD-10-CM | POA: Diagnosis not present

## 2016-12-23 DIAGNOSIS — H6123 Impacted cerumen, bilateral: Secondary | ICD-10-CM | POA: Diagnosis not present

## 2016-12-28 ENCOUNTER — Other Ambulatory Visit
Admission: RE | Admit: 2016-12-28 | Discharge: 2016-12-28 | Disposition: A | Discharge status: Home / Self Care | Payer: Medicare Other | Source: Ambulatory Visit | Attending: Specialist | Admitting: Specialist

## 2016-12-28 DIAGNOSIS — M009 Pyogenic arthritis, unspecified: Secondary | ICD-10-CM | POA: Insufficient documentation

## 2016-12-28 DIAGNOSIS — L02419 Cutaneous abscess of limb, unspecified: Secondary | ICD-10-CM | POA: Diagnosis not present

## 2016-12-28 LAB — BODY FLUID CELL COUNT WITH DIFFERENTIAL
EOS FL: 0 %
LYMPHS FL: 5 %
MONOCYTE-MACROPHAGE-SEROUS FLUID: 2 %
NEUTROPHIL FLUID: 93 %
OTHER CELLS FL: 0 %
Total Nucleated Cell Count, Fluid: 24367 cu mm

## 2016-12-30 DIAGNOSIS — L02419 Cutaneous abscess of limb, unspecified: Secondary | ICD-10-CM | POA: Diagnosis not present

## 2017-01-02 DIAGNOSIS — L02419 Cutaneous abscess of limb, unspecified: Secondary | ICD-10-CM | POA: Diagnosis not present

## 2017-01-02 LAB — AEROBIC/ANAEROBIC CULTURE W GRAM STAIN (SURGICAL/DEEP WOUND): Culture: NO GROWTH

## 2017-01-06 DIAGNOSIS — L02419 Cutaneous abscess of limb, unspecified: Secondary | ICD-10-CM | POA: Diagnosis not present

## 2017-01-16 ENCOUNTER — Other Ambulatory Visit: Payer: Self-pay | Admitting: Family Medicine

## 2017-01-17 DIAGNOSIS — L02419 Cutaneous abscess of limb, unspecified: Secondary | ICD-10-CM | POA: Diagnosis not present

## 2017-01-31 DIAGNOSIS — L02419 Cutaneous abscess of limb, unspecified: Secondary | ICD-10-CM | POA: Diagnosis not present

## 2017-02-14 DIAGNOSIS — M25511 Pain in right shoulder: Secondary | ICD-10-CM | POA: Diagnosis not present

## 2017-03-05 ENCOUNTER — Other Ambulatory Visit: Payer: Self-pay | Admitting: Family Medicine

## 2017-03-10 ENCOUNTER — Other Ambulatory Visit: Payer: Self-pay | Admitting: Family Medicine

## 2017-03-16 DIAGNOSIS — Z96641 Presence of right artificial hip joint: Secondary | ICD-10-CM | POA: Diagnosis not present

## 2017-03-30 ENCOUNTER — Other Ambulatory Visit: Payer: Self-pay | Admitting: Family Medicine

## 2017-03-31 NOTE — Telephone Encounter (Signed)
Patient advised as below, -aa

## 2017-03-31 NOTE — Telephone Encounter (Signed)
Please review for Dr Rosanna Randy. Thank you-aa

## 2017-03-31 NOTE — Telephone Encounter (Signed)
One month supply sent to pharmacy. Patient hasn't been seen for diabetes in ~1 yr, so please schedule her a f/u with PCP before further refills required. Thanks!  Virginia Crews, MD, MPH [facility] 03/31/2017 4:19 PM

## 2017-04-03 ENCOUNTER — Other Ambulatory Visit: Payer: Self-pay | Admitting: Family Medicine

## 2017-04-06 DIAGNOSIS — H40153 Residual stage of open-angle glaucoma, bilateral: Secondary | ICD-10-CM | POA: Diagnosis not present

## 2017-04-09 ENCOUNTER — Other Ambulatory Visit: Payer: Self-pay | Admitting: Family Medicine

## 2017-04-10 DIAGNOSIS — L02419 Cutaneous abscess of limb, unspecified: Secondary | ICD-10-CM | POA: Diagnosis not present

## 2017-04-12 ENCOUNTER — Other Ambulatory Visit: Payer: Self-pay

## 2017-04-25 ENCOUNTER — Other Ambulatory Visit: Payer: Self-pay | Admitting: Family Medicine

## 2017-04-27 ENCOUNTER — Encounter: Payer: Medicare Other | Attending: Surgery | Admitting: Surgery

## 2017-04-27 DIAGNOSIS — E11622 Type 2 diabetes mellitus with other skin ulcer: Secondary | ICD-10-CM | POA: Insufficient documentation

## 2017-04-27 DIAGNOSIS — Z951 Presence of aortocoronary bypass graft: Secondary | ICD-10-CM | POA: Diagnosis not present

## 2017-04-27 DIAGNOSIS — Z794 Long term (current) use of insulin: Secondary | ICD-10-CM | POA: Insufficient documentation

## 2017-04-27 DIAGNOSIS — Z79899 Other long term (current) drug therapy: Secondary | ICD-10-CM | POA: Diagnosis not present

## 2017-04-27 DIAGNOSIS — I10 Essential (primary) hypertension: Secondary | ICD-10-CM | POA: Diagnosis not present

## 2017-04-27 DIAGNOSIS — L97112 Non-pressure chronic ulcer of right thigh with fat layer exposed: Secondary | ICD-10-CM | POA: Insufficient documentation

## 2017-04-27 DIAGNOSIS — Z7982 Long term (current) use of aspirin: Secondary | ICD-10-CM | POA: Diagnosis not present

## 2017-04-27 DIAGNOSIS — L928 Other granulomatous disorders of the skin and subcutaneous tissue: Secondary | ICD-10-CM | POA: Diagnosis not present

## 2017-04-27 DIAGNOSIS — M1990 Unspecified osteoarthritis, unspecified site: Secondary | ICD-10-CM | POA: Insufficient documentation

## 2017-04-29 NOTE — Progress Notes (Signed)
Equihua, Jeania M. ([id number]) Visit Report for 04/27/2017 Abuse/Suicide Risk Screen Details Patient Name: Gabriela [name], [name] Date of Service: 04/27/2017 12:45 PM Medical Record Number: [id number] Patient Account Number: [id number] Date of Birth/Sex: 09/13/1925 (83 y.o. Female) Treating RN: Cornell Barman Primary Care Gowri Suchan: Cranford Mon, Delfino Lovett Other Clinician: Referring Geri Hepler: Kurtis Bushman Treating Aliyanah Rozas/Extender: Frann Rider in Treatment: 0 Abuse/Suicide Risk Screen Items Answer ABUSE/SUICIDE RISK SCREEN: Has anyone close to you tried to hurt or harm you recentlyo No Do you feel uncomfortable with anyone in your familyo No Has anyone forced you do things that you didnot want to doo No Do you have any thoughts of harming yourselfo No Patient displays signs or symptoms of abuse and/or neglect. No Electronic Signature(s) Signed: 04/27/2017 5:55:00 PM By: Gretta Cool, BSN, RN, CWS, Kim RN, BSN Entered By: Gretta Cool, BSN, RN, CWS, Kim on 04/27/2017 13:11:19 Poch, Ofilia Neas. ([id number]) -------------------------------------------------------------------------------- Activities of Daily Living Details Patient Name: [name] Date of Service: 04/27/2017 12:45 PM Medical Record Number: [id number] Patient Account Number: [id number] Date of Birth/Sex: 22-Jan-1926 (83 y.o. Female) Treating RN: Cornell Barman Primary Care Amarius Toto: Cranford Mon, Delfino Lovett Other Clinician: Referring Brilee Port: Kurtis Bushman Treating Vicktoria Muckey/Extender: Frann Rider in Treatment: 0 Activities of Daily Living Items Answer Activities of Daily Living (Please select one for each item) Drive Automobile Not Able Take Medications Completely Able Use Telephone Completely Able Care for Appearance Completely Able Use Toilet Completely Able Bath / Shower Completely Able Dress Self Completely Able Feed Self Completely Able Walk Completely Able Get In / Out Bed Completely Able Housework Completely  Able Prepare Meals Completely Able Handle Money Completely Able Shop for Self Completely Able Electronic Signature(s) Signed: 04/27/2017 5:55:00 PM By: Gretta Cool, BSN, RN, CWS, Kim RN, BSN Entered By: Gretta Cool, BSN, RN, CWS, Kim on 04/27/2017 13:11:33 Kassa, Ofilia Neas. ([id number]) -------------------------------------------------------------------------------- Education Assessment Details Patient Name: [name] Date of Service: 04/27/2017 12:45 PM Medical Record Number: [id number] Patient Account Number: [id number] Date of Birth/Sex: Mar 03, 1926 (83 y.o. Female) Treating RN: Cornell Barman Primary Care [place] Flight: Cranford Mon, Delfino Lovett Other Clinician: Referring Analisia Kingsford: Kurtis Bushman Treating Martita Brumm/Extender: Frann Rider in Treatment: 0 Primary Learner Assessed: Patient Learning Preferences/Education Level/Primary Language Learning Preference: Explanation Highest Education Level: High School Preferred Language: English Cognitive Barrier Assessment/Beliefs Language Barrier: No Translator Needed: No Memory Deficit: No Emotional Barrier: No Cultural/Religious Beliefs Affecting Medical No Care: Physical Barrier Assessment Impaired Vision: Yes Glasses Impaired Hearing: No Knowledge/Comprehension Assessment Knowledge Level: Medium Comprehension Level: Medium Ability to understand written Medium instructions: Ability to understand verbal Medium instructions: Motivation Assessment Anxiety Level: Calm Cooperation: Cooperative Education Importance: Acknowledges Need Interest in Health Problems: Asks Questions Perception: Confused Willingness to Engage in Self- High Management Activities: Readiness to Engage in Self- High Management Activities: Electronic Signature(s) Signed: 04/27/2017 5:55:00 PM By: Gretta Cool, BSN, RN, CWS, Kim RN, BSN Furlan, Preslee M. ([id number]) Entered By: Gretta Cool, BSN, RN, CWS, Kim on 04/27/2017 13:12:07 Mefferd, Ofilia Neas.  ([id number]) -------------------------------------------------------------------------------- Fall Risk Assessment Details Patient Name: [name] Date of Service: 04/27/2017 12:45 PM Medical Record Number: [id number] Patient Account Number: [id number] Date of Birth/Sex: Apr 18, 1926 (83 y.o. Female) Treating RN: Cornell Barman Primary Care Sausha Raymond: Cranford Mon, Delfino Lovett Other Clinician: Referring Malinda Mayden: Kurtis Bushman Treating Xavian Hardcastle/Extender: Frann Rider in Treatment: 0 Fall Risk Assessment Items Have you had 2 or more falls in the last 12 monthso 0 No Have you had any fall that resulted in injury in the last 12 monthso 0 No FALL RISK ASSESSMENT: History of  falling - immediate or within 3 months 0 No Secondary diagnosis 15 Yes Ambulatory aid None/bed rest/wheelchair/nurse 0 No Crutches/cane/walker 15 Yes Furniture 0 No IV Access/Saline Lock 0 No Gait/Training Normal/bed rest/immobile 0 No Weak 10 Yes Impaired 0 No Mental Status Oriented to own ability 0 No Electronic Signature(s) Signed: 04/27/2017 5:55:00 PM By: Gretta Cool, BSN, RN, CWS, Kim RN, BSN Entered By: Gretta Cool, BSN, RN, CWS, Kim on 04/27/2017 13:12:27 Lybeck, Ofilia Neas. ([id number]) -------------------------------------------------------------------------------- Foot Assessment Details Patient Name: [name] Date of Service: 04/27/2017 12:45 PM Medical Record Number: [id number] Patient Account Number: [id number] Date of Birth/Sex: Jan 06, 1926 (83 y.o. Female) Treating RN: Cornell Barman Primary Care Jianni Shelden: Cranford Mon, Delfino Lovett Other Clinician: Referring Seairra Otani: Kurtis Bushman Treating Samul Mcinroy/Extender: Frann Rider in Treatment: 0 Foot Assessment Items Site Locations + = Sensation present, - = Sensation absent, C = Callus, U = Ulcer R = Redness, W = Warmth, M = Maceration, PU = Pre-ulcerative lesion F = Fissure, S = Swelling, D = Dryness Assessment Right: Left: Other Deformity: No  No Prior Foot Ulcer: No No Prior Amputation: No No Charcot Joint: No No Ambulatory Status: Ambulatory With Help Assistance Device: Walker GaitEnergy manager) Signed: 04/27/2017 5:55:00 PM By: Gretta Cool, BSN, RN, CWS, Kim RN, BSN Entered By: Gretta Cool, BSN, RN, CWS, Kim on 04/27/2017 13:13:08 Sandt, Ofilia Neas. ([id number]) -------------------------------------------------------------------------------- Nutrition Risk Assessment Details Patient Name: MARIONA, SCHOLES. Date of Service: 04/27/2017 12:45 PM Medical Record Number: [id number] Patient Account Number: [id number] Date of Birth/Sex: 1925-10-25 (83 y.o. Female) Treating RN: Cornell Barman Primary Care Chandra Asher: Cranford Mon, Delfino Lovett Other Clinician: Referring Kimbely Whiteaker: Kurtis Bushman Treating Peyton Rossner/Extender: Frann Rider in Treatment: 0 Height (in): 63 Weight (lbs): 145 Body Mass Index (BMI): 25.7 Nutrition Risk Assessment Items NUTRITION RISK SCREEN: I have an illness or condition that made me change the kind and/or 0 No amount of food I eat I eat fewer than two meals per day 0 No I eat few fruits and vegetables, or milk products 0 No I have three or more drinks of beer, liquor or wine almost every day 0 No I have tooth or mouth problems that make it hard for me to eat 0 No I don't always have enough money to buy the food I need 0 No I eat alone most of the time 1 Yes I take three or more different prescribed or over-the-counter drugs a 1 Yes day Without wanting to, I have lost or gained 10 pounds in the last six 0 No months I am not always physically able to shop, cook and/or feed myself 0 No Nutrition Protocols Good Risk Protocol Provide education on elevated blood sugars Moderate Risk Protocol 0 and impact on wound healing, as applicable Electronic Signature(s) Signed: 04/27/2017 5:55:00 PM By: Gretta Cool, BSN, RN, CWS, Kim RN, BSN Entered By: Gretta Cool, BSN, RN, CWS, Kim on 04/27/2017 13:12:48

## 2017-04-29 NOTE — Progress Notes (Signed)
[name], [name] ([id number]) Visit Report for 04/27/2017 Chief Complaint Document Details Patient Name: Gabriela [name], [name] Date of Service: 04/27/2017 12:45 PM Medical Record Number: [id number] Patient Account Number: [id number] Date of Birth/Sex: [birth year] (83 y.o. Female) Treating RN: Cornell Barman Primary Care Provider: Cranford Mon, Delfino Lovett Other Clinician: Referring Provider: Kurtis Bushman Treating Provider/Extender: Frann Rider in Treatment: 0 Information Obtained from: Patient Chief Complaint Patients presents for treatment of an open diabetic ulcer To the right thigh which has been opened for about 3 months Electronic Signature(s) Signed: 04/27/2017 5:02:50 PM By: Christin Fudge MD, FACS Entered By: Christin Fudge on 04/27/2017 13:41:20 [name], [name] ([id number]) -------------------------------------------------------------------------------- HPI Details Patient Name: [name] Date of Service: 04/27/2017 12:45 PM Medical Record Number: [id number] Patient Account Number: [id number] Date of Birth/Sex: Oct 05, 1925 (83 y.o. Female) Treating RN: Cornell Barman Primary Care Provider: Cranford Mon, Delfino Lovett Other Clinician: Referring Provider: Kurtis Bushman Treating Provider/Extender: Frann Rider in Treatment: 0 History of Present Illness Location: right anterior thigh Quality: Patient reports experiencing a dull pain to affected area(s). Severity: Patient states wound are getting worse. Duration: Patient has had the wound for > 3 months prior to seeking treatment at the wound center Timing: Pain in wound is Intermittent (comes and goes Context: The wound occurred when the patient had an abscess which was drained and then continued to be open Modifying Factors: Other treatment(s) tried include:local dressing by the orthopedic surgeon Associated Signs and Symptoms: Patient reports having increase discharge. HPI Description: 83 year old female with right thigh  abscess and ulceration of the right thigh noted by her orthopedic surgeon Dr. Kurtis Bushman. The patient has had right total hip arthroplasty with ostial lysis done recently in June 2017 and x-ray done postoperatively looked good. Patient was previously treated with Keflex and Septra. reviewing the orthopedic notes Dr. Sabra Heck had done a IandD of the abscess somewhere in June and at some stage had cauterized hyperbaric granulation tissue which continues to have drainage from the wound. Electronic Signature(s) Signed: 04/27/2017 5:02:50 PM By: Christin Fudge MD, FACS Entered By: Christin Fudge on 04/27/2017 13:45:11 [name], [name] ([id number]) -------------------------------------------------------------------------------- Otelia Sergeant TISS Details Patient Name: [name] Date of Service: 04/27/2017 12:45 PM Medical Record Number: [id number] Patient Account Number: [id number] Date of Birth/Sex: 1926-04-07 (83 y.o. Female) Treating RN: Cornell Barman Primary Care Provider: Cranford Mon, Delfino Lovett Other Clinician: Referring Provider: Kurtis Bushman Treating Provider/Extender: Frann Rider in Treatment: 0 Procedure Performed for: Wound #1 Right,Proximal Upper Leg Performed By: Physician Christin Fudge, MD Post Procedure Diagnosis Same as Pre-procedure Notes 1 silver nitrate stick used. Electronic Signature(s) Signed: 04/27/2017 5:02:50 PM By: Christin Fudge MD, FACS Entered By: Christin Fudge on 04/27/2017 13:46:11 [name], [name] ([id number]) -------------------------------------------------------------------------------- Physical Exam Details Patient Name: [name] Date of Service: 04/27/2017 12:45 PM Medical Record Number: [id number] Patient Account Number: [id number] Date of Birth/Sex: [birth year] (83 y.o. Female) Treating RN: Cornell Barman Primary Care Provider: Cranford Mon, Delfino Lovett Other Clinician: Referring Provider: Kurtis Bushman Treating Provider/Extender:  Frann Rider in Treatment: 0 Constitutional . Pulse regular. Respirations normal and unlabored. Afebrile. . Eyes Nonicteric. Reactive to light. Ears, Nose, Mouth, and Throat Lips, teeth, and gums WNL.Marland Kitchen Moist mucosa without lesions. Neck supple and nontender. No palpable supraclavicular or cervical adenopathy. Normal sized without goiter. Respiratory WNL. No retractions.. Cardiovascular Pedal Pulses WNL. No clubbing, cyanosis or edema. Gastrointestinal (GI) Abdomen without masses or tenderness.. No liver or spleen enlargement or tenderness.. Lymphatic No adneopathy. No adenopathy. No adenopathy. Musculoskeletal  Adexa without tenderness or enlargement.. Digits and nails w/o clubbing, cyanosis, infection, petechiae, ischemia, or inflammatory conditions.. Integumentary (Hair, Skin) No suspicious lesions. No crepitus or fluctuance. No peri-wound warmth or erythema. No masses.Marland Kitchen Psychiatric Judgement and insight Intact.. No evidence of depression, anxiety, or agitation.. Notes the patient has a circular opening on the right anterior thigh with protruding hyperbaric granulation tissue almost like a pyogenic granuloma. This was cauterized with a silver nitrate stick Electronic Signature(s) Signed: 04/27/2017 5:02:50 PM By: Christin Fudge MD, FACS Entered By: Christin Fudge on 04/27/2017 13:45:58 [name], [name] ([id number]) -------------------------------------------------------------------------------- Physician Orders Details Patient Name: [name] Date of Service: 04/27/2017 12:45 PM Medical Record Number: [id number] Patient Account Number: [id number] Date of Birth/Sex: Jan 12, 1926 (83 y.o. Female) Treating RN: Cornell Barman Primary Care Provider: Cranford Mon, Delfino Lovett Other Clinician: Referring Provider: Kurtis Bushman Treating Provider/Extender: Frann Rider in Treatment: 0 Verbal / Phone Orders: No Diagnosis Coding Wound Cleansing Wound #1 Right,Proximal Upper  Leg o Clean wound with Normal Saline. Anesthetic Wound #1 Right,Proximal Upper Leg o Topical Lidocaine 4% cream applied to wound bed prior to debridement Primary Wound Dressing Wound #1 Right,Proximal Upper Leg o Hydrafera Blue Secondary Dressing Wound #1 Right,Proximal Upper Leg o Boardered Foam Dressing Dressing Change Frequency Wound #1 Right,Proximal Upper Leg o Change dressing every other day. Follow-up Appointments Wound #1 Right,Proximal Upper Leg o Return Appointment in 1 week. Electronic Signature(s) Signed: 04/27/2017 5:02:50 PM By: Christin Fudge MD, FACS Signed: 04/27/2017 5:55:00 PM By: Gretta Cool, BSN, RN, CWS, Kim RN, BSN Entered By: Gretta Cool, BSN, RN, CWS, Kim on 04/27/2017 13:40:47 [name], [name] ([id number]) -------------------------------------------------------------------------------- Problem List Details Patient Name: DEVRI, KREHER. Date of Service: 04/27/2017 12:45 PM Medical Record Number: [id number] Patient Account Number: [id number] Date of Birth/Sex: 02-06-1926 (83 y.o. 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 ([id number]) -------------------------------------------------------------------------------- Progress Note Details Patient Name: DANIQUA, CAMPOY. Date of Service: 04/27/2017 12:45 PM Medical Record Number: [id number] Patient Account Number: [id number] Date of Birth/Sex:  August 14, 1926 (83 y.o. Female) Treating RN: Cornell Barman Primary Care Provider: Cranford Mon, Delfino Lovett Other Clinician: Referring Provider: Kurtis Bushman Treating Provider/Extender: Frann Rider in Treatment: 0 Subjective Chief Complaint Information obtained from Patient Patients presents for treatment of an open diabetic ulcer To the right thigh which has been opened for about 3 months History of Present Illness (HPI) The following HPI elements were documented for the patient's wound: Location: right anterior thigh Quality: Patient reports experiencing a dull pain to affected area(s). Severity: Patient states wound are getting worse. Duration: Patient has had the wound for > 3 months prior to seeking treatment at the wound center Timing: Pain in wound is Intermittent (comes and goes Context: The wound occurred when the patient had an abscess which was drained and then continued to be open Modifying Factors: Other treatment(s) tried include:local dressing by the orthopedic surgeon Associated Signs and Symptoms: Patient reports having increase discharge. 83 year old female with right thigh abscess and ulceration of the right thigh noted by her orthopedic surgeon Dr. Kurtis Bushman. The patient has had right  total hip arthroplasty with ostial lysis done recently in June 2017 and x-ray done postoperatively looked good. Patient was previously treated with Keflex and Septra. reviewing the orthopedic notes Dr. Sabra Heck had done a IandD of the abscess somewhere in June and at some stage had cauterized hyperbaric granulation tissue which continues to have drainage from the wound. Wound History Patient presents with 1 open wound that has been present for approximately 6 months. Patient has been treating wound in the following manner: bandaid. Laboratory tests have been performed in the last month. Patient reportedly has tested positive for an antibiotic resistant organism. Patient  History Information obtained from Patient, Chart. Allergies No Known Drug Allergies Family History Cancer - Child, Diabetes - Child, Heart Disease - Child, Bucklin, Shaneal M. ([id number]) No family history of Hypertension, Kidney Disease, Lung Disease, Seizures, Stroke, Thyroid Problems, Tuberculosis. Social History Never smoker, Marital Status - Widowed, Alcohol Use - Never, Drug Use - No History, Caffeine Use - Daily. Medical History Eyes Patient has history of Cataracts - removed, Glaucoma Denies history of Optic Neuritis Ear/Nose/Mouth/Throat Denies history of Chronic sinus problems/congestion, Middle ear problems Hematologic/Lymphatic Patient has history of Anemia Denies history of Hemophilia, Human Immunodeficiency Virus, Lymphedema, Sickle Cell Disease Respiratory Denies history of Aspiration, Asthma, Chronic Obstructive Pulmonary Disease (COPD), Pneumothorax, Sleep Apnea, Tuberculosis Cardiovascular Patient has history of Hypertension Denies history of Angina, Arrhythmia, Congestive Heart Failure, Coronary Artery Disease, Deep Vein Thrombosis, Hypotension, Myocardial Infarction, Peripheral Arterial Disease, Peripheral Venous Disease, Phlebitis, Vasculitis Gastrointestinal Denies history of Cirrhosis , Colitis, Crohn s, Hepatitis A, Hepatitis C Endocrine Patient has history of Type II Diabetes Denies history of Type I Diabetes Genitourinary Denies history of End Stage Renal Disease Immunological Denies history of Lupus Erythematosus, Raynaud s, Scleroderma Integumentary (Skin) Denies history of History of Burn, History of pressure wounds Musculoskeletal Patient has history of Osteoarthritis Denies history of Gout, Rheumatoid Arthritis, Osteomyelitis Neurologic Denies history of Dementia, Neuropathy, Quadriplegia, Paraplegia, Seizure Disorder Oncologic Denies history of Received Chemotherapy, Received Radiation Psychiatric Denies history of Anorexia/bulimia,  Confinement Anxiety Patient is treated with Insulin. Blood sugar is not tested. Medical And Surgical History Notes Constitutional Symptoms (General Health) High Blood Pressure, Diabetes, Aspirin for heart, fluid pil Cardiovascular Heart Bypass Gadsden, Lonnetta M. ([id number]) Review of Systems (ROS) Constitutional Symptoms (General Health) The patient has no complaints or symptoms. Eyes The patient has no complaints or symptoms. Ear/Nose/Mouth/Throat The patient has no complaints or symptoms. Hematologic/Lymphatic The patient has no complaints or symptoms. Respiratory The patient has no complaints or symptoms. Cardiovascular Denies complaints or symptoms of Chest pain, LE edema. Gastrointestinal The patient has no complaints or symptoms. Endocrine The patient has no complaints or symptoms. Genitourinary The patient has no complaints or symptoms. Immunological The patient has no complaints or symptoms. Integumentary (Skin) Complains or has symptoms of Wounds, Bleeding or bruising tendency. Denies complaints or symptoms of Breakdown, Swelling. Musculoskeletal The patient has no complaints or symptoms. Neurologic The patient has no complaints or symptoms. Oncologic The patient has no complaints or symptoms. Psychiatric The patient has no complaints or symptoms. Medications aspirin 325 mg tablet,delayed release oral tablet,delayed release (DR/EC) oral gabapentin 100 mg capsule oral capsule oral pioglitazone 45 mg tablet oral tablet oral metoprolol tartrate 25 mg tablet oral tablet oral prednisolone acetate 1 % eye drops,suspension ophthalmic (eye) drops,suspension ophthalmic (eye) Levemir FlexTouch U-100 Insulin 100 unit/mL (3 mL) subcutaneous pen subcutaneous insulin pen subcutaneous latanoprost 0.005 % eye drops ophthalmic (eye) drops ophthalmic (eye) Simbrinza 1 %-0.2 % eye drops,suspension  ophthalmic (eye) drops,suspension ophthalmic (eye) Travatan Z 0.004 % eye drops  ophthalmic (eye) drops ophthalmic (eye) [name], [name] ([id number]) brompheniramine-pseudoephedrine-DM 2 mg-30 mg-10 mg/5 mL syrup oral syrup oral meloxicam 7.5 mg tablet oral tablet oral doxycycline hyclate 100 mg tablet oral tablet oral hydrochlorothiazide 25 mg tablet oral tablet oral Objective Constitutional Pulse regular. Respirations normal and unlabored. Afebrile. Vitals Time Taken: 1:01 PM, Height: 63 in, Weight: 145 lbs, Source: Measured, BMI: 25.7, Temperature: 98.5 F, Pulse: 67 bpm, Respiratory Rate: 16 breaths/min, Blood Pressure: 162/61 mmHg. Eyes Nonicteric. Reactive to light. Ears, Nose, Mouth, and Throat Lips, teeth, and gums WNL.Marland Kitchen Moist mucosa without lesions. Neck supple and nontender. No palpable supraclavicular or cervical adenopathy. Normal sized without goiter. Respiratory WNL. No retractions.. Cardiovascular Pedal Pulses WNL. No clubbing, cyanosis or edema. Gastrointestinal (GI) Abdomen without masses or tenderness.. No liver or spleen enlargement or tenderness.. Lymphatic No adneopathy. No adenopathy. No adenopathy. Musculoskeletal Adexa without tenderness or enlargement.. Digits and nails w/o clubbing, cyanosis, infection, petechiae, ischemia, or inflammatory conditions.Marland Kitchen Psychiatric Judgement and insight Intact.. No evidence of depression, anxiety, or agitation.. General Notes: the patient has a circular opening on the right anterior thigh with protruding hyperbaric granulation tissue almost like a pyogenic granuloma. This was cauterized with a silver nitrate stick [name], [name] ([id number]) Integumentary (Hair, Skin) No suspicious lesions. No crepitus or fluctuance. No peri-wound warmth or erythema. No masses.. Wound #1 status is Open. Original cause of wound was Gradually Appeared. The wound is located on the Right,Proximal Upper Leg. The wound measures 0.5cm length x 0.6cm width x 0.1cm depth; 0.236cm^2 area and 0.024cm^3 volume. There is no  tunneling or undermining noted. There is a large amount of serous drainage noted. The wound margin is distinct with the outline attached to the wound base. There is large (67-100%) granulation within the wound bed. There is no necrotic tissue within the wound bed. The periwound skin appearance had no abnormalities noted for texture. The periwound skin appearance had no abnormalities noted for moisture. The periwound skin appearance had no abnormalities noted for color. Periwound temperature was noted as No Abnormality. The periwound has tenderness on palpation. Assessment Active Problems ICD-10 E11.622 - Type 2 diabetes mellitus with other skin ulcer L97.112 - Non-pressure chronic ulcer of right thigh with fat layer exposed this 83 year old patient was a diabetic and apparently has well-controlled diabetes mellitus has a wound on the anterior thigh on the right side with hyperbaric granulation tissue pouting out of the site of incision and drainage. After review of recommended: 1. Hydrofera Blue to be packed into the wound and covered with a bordered foam to be changed every other day. 2. good control of her diabetes mellitus 3. Appropriate amount of protein, vitamin E, vitamin C and zinc 4. Regular visits to the wound center Procedures Wound #1 Pre-procedure diagnosis of Wound #1 is a To be determined located on the Right,Proximal Upper Leg . An CHEM CAUT GRANULATION TISS procedure was performed by Christin Fudge, MD. Post procedure Diagnosis Wound #1: Same as Pre-Procedure Notes: 1 silver nitrate stick used. [name], [name] ([id number]) Plan Wound Cleansing: Wound #1 Right,Proximal Upper Leg: Clean wound with Normal Saline. Anesthetic: Wound #1 Right,Proximal Upper Leg: Topical Lidocaine 4% cream applied to wound bed prior to debridement Primary Wound Dressing: Wound #1 Right,Proximal Upper Leg: Hydrafera Blue Secondary Dressing: Wound #1 Right,Proximal Upper Leg: Boardered  Foam Dressing Dressing Change Frequency: Wound #1 Right,Proximal Upper Leg: Change dressing every other day. Follow-up Appointments: Wound #1 Right,Proximal Upper  Leg: Return Appointment in 1 week. this 83 year old patient was a diabetic and apparently has well-controlled diabetes mellitus has a wound on the anterior thigh on the right side with hyperbaric granulation tissue pouting out of the site of incision and drainage. After review of recommended: 1. Hydrofera Blue to be packed into the wound and covered with a bordered foam to be changed every other day. 2. good control of her diabetes mellitus 3. Appropriate amount of protein, vitamin E, vitamin C and zinc 4. Regular visits to the wound center Electronic Signature(s) Signed: 04/27/2017 5:02:50 PM By: Christin Fudge MD, FACS Entered By: Christin Fudge on 04/27/2017 13:48:35 [name], [name] ([id number]) -------------------------------------------------------------------------------- ROS/PFSH Details Patient Name: Gabriela [name], RISS. Date of Service: 04/27/2017 12:45 PM Medical Record Number: [id number] Patient Account Number: [id number] Date of Birth/Sex: 1926/01/26 (83 y.o. Female) Treating RN: Cornell Barman Primary Care Provider: Cranford Mon, Delfino Lovett Other Clinician: Referring Provider: Kurtis Bushman Treating Provider/Extender: Frann Rider in Treatment: 0 Information Obtained From Patient Chart Wound History Do you currently have one or more open woundso Yes How many open wounds do you currently haveo 1 Approximately how long have you had your woundso 6 months How have you been treating your wound(s) until nowo bandaid Cardiovascular Complaints and Symptoms: Negative for: Chest pain; LE edema Medical History: Positive for: Hypertension Negative for: Angina; Arrhythmia; Congestive Heart Failure; Coronary Artery Disease; Deep Vein Thrombosis; Hypotension; Myocardial Infarction; Peripheral Arterial Disease; Peripheral  Venous Disease; Phlebitis; Vasculitis Past Medical History Notes: Heart Bypass Integumentary (Skin) Complaints and Symptoms: Positive for: Wounds; Bleeding or bruising tendency Negative for: Breakdown; Swelling Medical History: Negative for: History of Burn; History of pressure wounds Constitutional Symptoms (General Health) Complaints and Symptoms: No Complaints or Symptoms Medical History: Past Medical History Notes: High Blood Pressure, Diabetes, Aspirin for heart, fluid pil Eyes Complaints and Symptoms: No Complaints or Symptoms Holberg, Ashauna M. ([id number]) Medical History: Positive for: Cataracts - removed; Glaucoma Negative for: Optic Neuritis Ear/Nose/Mouth/Throat Complaints and Symptoms: No Complaints or Symptoms Medical History: Negative for: Chronic sinus problems/congestion; Middle ear problems Hematologic/Lymphatic Complaints and Symptoms: No Complaints or Symptoms Medical History: Positive for: Anemia Negative for: Hemophilia; Human Immunodeficiency Virus; Lymphedema; Sickle Cell Disease Respiratory Complaints and Symptoms: No Complaints or Symptoms Medical History: Negative for: Aspiration; Asthma; Chronic Obstructive Pulmonary Disease (COPD); Pneumothorax; Sleep Apnea; Tuberculosis Gastrointestinal Complaints and Symptoms: No Complaints or Symptoms Medical History: Negative for: Cirrhosis ; Colitis; Crohnos; Hepatitis A; Hepatitis C Endocrine Complaints and Symptoms: No Complaints or Symptoms Medical History: Positive for: Type II Diabetes Negative for: Type I Diabetes Time with diabetes: 50 Treated with: Insulin Blood sugar tested every day: No Kosik, Sanora M. ([id number]) Genitourinary Complaints and Symptoms: No Complaints or Symptoms Medical History: Negative for: End Stage Renal Disease Immunological Complaints and Symptoms: No Complaints or Symptoms Medical History: Negative for: Lupus Erythematosus; Raynaudos;  Scleroderma Musculoskeletal Complaints and Symptoms: No Complaints or Symptoms Medical History: Positive for: Osteoarthritis Negative for: Gout; Rheumatoid Arthritis; Osteomyelitis Neurologic Complaints and Symptoms: No Complaints or Symptoms Medical History: Negative for: Dementia; Neuropathy; Quadriplegia; Paraplegia; Seizure Disorder Oncologic Complaints and Symptoms: No Complaints or Symptoms Medical History: Negative for: Received Chemotherapy; Received Radiation Psychiatric Complaints and Symptoms: No Complaints or Symptoms Medical History: Negative for: Anorexia/bulimia; Confinement Anxiety HBO Extended History Items Tash, Lazara M. ([id number]) Eyes: Eyes: Cataracts Glaucoma Immunizations Pneumococcal Vaccine: Received Pneumococcal Vaccination: Yes Family and Social History Cancer: Yes - Child; Diabetes: Yes - Child; Heart Disease: Yes - Child; Hypertension: No; Kidney Disease: No; Lung  Disease: No; Seizures: No; Stroke: No; Thyroid Problems: No; Tuberculosis: No; Never smoker; Marital Status - Widowed; Alcohol Use: Never; Drug Use: No History; Caffeine Use: Daily; Advanced Directives: No; Patient does not want information on Advanced Directives; Living Will: No; Medical Power of Attorney: No Physician Affirmation I have reviewed and agree with the above information. Electronic Signature(s) Signed: 04/27/2017 5:02:50 PM By: Christin Fudge MD, FACS Signed: 04/27/2017 5:55:00 PM By: Gretta Cool, BSN, RN, CWS, Kim RN, BSN Entered By: Christin Fudge on 04/27/2017 13:31:31 [name], [name] ([id number]) -------------------------------------------------------------------------------- SuperBill Details Patient Name: DASJA, BRASE. Date of Service: 04/27/2017 Medical Record Number: [id number] Patient Account Number: [id number] Date of Birth/Sex: 08/04/1926 (83 y.o. 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 Facility Procedures CPT4 Code Description: 72257505 99213 - WOUND CARE VISIT-LEV 3 EST PT Modifier: Quantity: 1 CPT4 Code Description: [id number] - CHEM CAUT GRANULATION TISS ICD-10 Description Diagnosis E11.622 Type 2 diabetes mellitus with other skin ulcer L97.112 Non-pressure chronic ulcer of right thigh with fat Modifier: layer exposed Quantity: 1 Physician Procedures CPT4 Code Description: [id number] 03128 - WC PHYS LEVEL 4 - NEW PT ICD-10 Description Diagnosis E11.622 Type 2 diabetes mellitus with other skin ulcer L97.112 Non-pressure chronic ulcer of right thigh with fat Modifier: layer exposed Quantity: 1 CPT4 Code Description: [id number] - WC PHYS CHEM CAUT GRAN TISSUE ICD-10 Description Diagnosis E11.622 Type 2 diabetes mellitus with other skin ulcer 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

## 2017-05-01 NOTE — Progress Notes (Addendum)
[name], [name] ([id number]) Visit Report for 04/27/2017 Allergy List Details Patient Name: Gabriela [name], [name] Date of Service: 04/27/2017 12:45 PM Medical Record Number: [id number] Patient Account Number: [id number] Date of Birth/Sex: 23-Mar-1926 (83 y.o. Female) Treating RN: Cornell Barman Primary Care Lemond Griffee: Cranford Mon, Delfino Lovett Other Clinician: Referring Terralyn Matsumura: Kurtis Bushman Treating Bettye Sitton/Extender: Frann Rider in Treatment: 0 Allergies Active Allergies No Known Drug Allergies Allergy Notes Electronic Signature(s) Signed: 04/27/2017 5:55:00 PM By: Gretta Cool, BSN, RN, CWS, Kim RN, BSN Entered By: Gretta Cool, BSN, RN, CWS, Kim on 04/27/2017 13:03:06 [name], [name] ([id number]) -------------------------------------------------------------------------------- Arrival Information Details Patient Name: [name] Date of Service: 04/27/2017 12:45 PM Medical Record Number: [id number] Patient Account Number: [id number] Date of Birth/Sex: 15-Aug-1926 (83 y.o. Female) Treating RN: Cornell Barman Primary Care Bo Rogue: Cranford Mon, Delfino Lovett Other Clinician: Referring Evita Merida: Kurtis Bushman Treating Gabriela [name]/Extender: Frann Rider in Treatment: 0 Visit Information Patient Arrived: Wheel Chair Arrival Time: 12:58 Accompanied By: grandaughter Transfer Assistance: None Patient Identification Verified: Yes Secondary Verification Process Yes Completed: Patient Has Alerts: Yes Patient Alerts: Patient on Blood Thinner Type II Diabetic Aspirin 325mg  Electronic Signature(s) Signed: 04/27/2017 5:55:00 PM By: Gretta Cool, BSN, RN, CWS, Kim RN, BSN Entered By: Gretta Cool, BSN, RN, CWS, Kim on 04/27/2017 13:00:05 [name], [name] ([id number]) -------------------------------------------------------------------------------- Clinic Level of Care Assessment Details Patient Name: [name] Date of Service: 04/27/2017 12:45 PM Medical Record Number: [id number] Patient Account Number:  [id number] Date of Birth/Sex: [birth year] (83 y.o. Female) Treating RN: Cornell Barman Primary Care Bula Cavalieri: Cranford Mon, Delfino Lovett Other Clinician: Referring Kaylum Shrum: Kurtis Bushman Treating Shyan Scalisi/Extender: Frann Rider in Treatment: 0 Clinic Level of Care Assessment Items TOOL 1 Quantity Score []  - Use when EandM and Procedure is performed on INITIAL visit 0 ASSESSMENTS - Nursing Assessment / Reassessment X - General Physical Exam (combine w/ comprehensive assessment (listed just 1 20 below) when performed on new pt. evals) X - Comprehensive Assessment (HX, ROS, Risk Assessments, Wounds Hx, etc.) 1 25 ASSESSMENTS - Wound and Skin Assessment / Reassessment []  - Dermatologic / Skin Assessment (not related to wound area) 0 ASSESSMENTS - Ostomy and/or Continence Assessment and Care []  - Incontinence Assessment and Management 0 []  - Ostomy Care Assessment and Management (repouching, etc.) 0 PROCESS - Coordination of Care X - Simple Patient / Family Education for ongoing care 1 15 []  - Complex (extensive) Patient / Family Education for ongoing care 0 X - Staff obtains Programmer, systems, Records, Test Results / Process Orders 1 10 []  - Staff telephones HHA, Nursing Homes / Clarify orders / etc 0 []  - Routine Transfer to another Facility (non-emergent condition) 0 []  - Routine Hospital Admission (non-emergent condition) 0 X - New Admissions / Biomedical engineer / Ordering NPWT, Apligraf, etc. 1 15 []  - Emergency Hospital Admission (emergent condition) 0 PROCESS - Special Needs []  - Pediatric / Minor Patient Management 0 []  - Isolation Patient Management 0 [name], [name] ([id number]) []  - Hearing / Language / Visual special needs 0 []  - Assessment of Community assistance (transportation, D/C planning, etc.) 0 []  - Additional assistance / Altered mentation 0 []  - Support Surface(s) Assessment (bed, cushion, seat, etc.) 0 INTERVENTIONS - Miscellaneous []  - External ear exam 0 []  -  Patient Transfer (multiple staff / Civil Service fast streamer / Similar devices) 0 []  - Simple Staple / Suture removal (25 or less) 0 []  - Complex Staple / Suture removal (26 or more) 0 []  - Hypo/Hyperglycemic Management (do not check if billed separately) 0 []  - Ankle /  Brachial Index (ABI) - do not check if billed separately 0 Has the patient been seen at the hospital within the last three years: Yes Total Score: 85 Level Of Care: New/Established - Level 3 Electronic Signature(s) Signed: 04/27/2017 5:55:00 PM By: Gretta Cool, BSN, RN, CWS, Kim RN, BSN Entered By: Gretta Cool, BSN, RN, CWS, Kim on 04/27/2017 13:41:40 [name], [name] ([id number]) -------------------------------------------------------------------------------- Encounter Discharge Information Details Patient Name: Gabriela [name], [name] Date of Service: 04/27/2017 12:45 PM Medical Record Number: [id number] Patient Account Number: [id number] Date of Birth/Sex: 07-23-1926 (83 y.o. Female) Treating RN: Cornell Barman Primary Care Sentoria Brent: Cranford Mon, Delfino Lovett Other Clinician: Referring Tanylah Schnoebelen: Kurtis Bushman Treating Abdulahad Mederos/Extender: Frann Rider in Treatment: 0 Encounter Discharge Information Items Discharge Pain Level: 0 Discharge Condition: Stable Ambulatory Status: Walker Discharge Destination: Home Transportation: Private Auto Accompanied By: grandaughter Schedule Follow-up Appointment: Yes Medication Reconciliation completed and provided to Patient/Care Yes Akane Tessier: Provided on Clinical Summary of Care: 04/27/2017 Form Type Recipient Paper Patient LB Electronic Signature(s) Signed: 05/01/2017 10:13:53 AM By: Ruthine Dose Entered By: Ruthine Dose on 04/27/2017 13:43:57 [name], [name] ([id number]) -------------------------------------------------------------------------------- Lower Extremity Assessment Details Patient Name: [name] Date of Service: 04/27/2017 12:45 PM Medical Record Number: [id number] Patient  Account Number: [id number] Date of Birth/Sex: [birth year] (83 y.o. Female) Treating RN: Cornell Barman Primary Care Aunna Snooks: Cranford Mon, Delfino Lovett Other Clinician: Referring Sophonie Goforth: Kurtis Bushman Treating Solstice Lastinger/Extender: Frann Rider in Treatment: 0 Electronic Signature(s) Signed: 04/27/2017 5:55:00 PM By: Gretta Cool, BSN, RN, CWS, Kim RN, BSN Entered By: Gretta Cool, BSN, RN, CWS, Kim on 04/27/2017 13:49:02 Tarry, [name] ([id number]) -------------------------------------------------------------------------------- Multi Wound Chart Details Patient Name: MARTHENA, WHITMYER. Date of Service: 04/27/2017 12:45 PM Medical Record Number: [id number] Patient Account Number: [id number] Date of Birth/Sex: 07-11-1926 (83 y.o. Female) Treating RN: Cornell Barman Primary Care Febe Champa: Cranford Mon, Delfino Lovett Other Clinician: Referring Archer Vise: Kurtis Bushman Treating Martia Dalby/Extender: Frann Rider in Treatment: 0 Vital Signs Height(in): 63 Pulse(bpm): 67 Weight(lbs): 145 Blood Pressure 162/61 (mmHg): Body Mass Index(BMI): 26 Temperature(F): 98.5 Respiratory Rate 16 (breaths/min): Photos: [1:No Photos] [N/A:N/A] Wound Location: [1:Right Upper Leg - Proximal] [N/A:N/A] Wounding Event: [1:Gradually Appeared] [N/A:N/A] Primary Etiology: [1:To be determined] [N/A:N/A] Comorbid History: [1:Cataracts, Glaucoma, Anemia, Hypertension, Type II Diabetes, Osteoarthritis] [N/A:N/A] Date Acquired: [1:01/13/2017] [N/A:N/A] Weeks of Treatment: [1:0] [N/A:N/A] Wound Status: [1:Open] [N/A:N/A] Measurements L x W x D 0.5x0.6x0.1 [N/A:N/A] (cm) Area (cm) : [1:0.236] [N/A:N/A] Volume (cm) : [1:0.024] [N/A:N/A] Classification: [1:Partial Thickness] [N/A:N/A] Exudate Amount: [1:Large] [N/A:N/A] Exudate Type: [1:Serous] [N/A:N/A] Exudate Color: [1:amber] [N/A:N/A] Wound Margin: [1:Distinct, outline attached] [N/A:N/A] Granulation Amount: [1:Large (67-100%)] [N/A:N/A] Necrotic Amount: [1:None Present  (0%)] [N/A:N/A] Exposed Structures: [1:Fascia: No Fat Layer (Subcutaneous Tissue) Exposed: No Tendon: No Muscle: No Joint: No Bone: No] [N/A:N/A] Epithelialization: None N/A N/A Periwound Skin Texture: No Abnormalities Noted N/A N/A Periwound Skin Maceration: Yes N/A N/A Moisture: Periwound Skin Color: No Abnormalities Noted N/A N/A Temperature: No Abnormality N/A N/A Tenderness on Yes N/A N/A Palpation: Wound Preparation: Ulcer Cleansing: N/A N/A Rinsed/Irrigated with Saline Topical Anesthetic Applied: Other: lidocaine 4% Treatment Notes Electronic Signature(s) Signed: 04/27/2017 5:02:50 PM By: Christin Fudge MD, FACS Entered By: Christin Fudge on 04/27/2017 13:40:53 [name], [name] ([id number]) -------------------------------------------------------------------------------- [place] Details Patient Name: Gabriela [name], [name] Date of Service: 04/27/2017 12:45 PM Medical Record Number: [id number] Patient Account Number: [id number] Date of Birth/Sex: 1925-10-04 (83 y.o. Female) Treating RN: Cornell Barman Primary Care Elliotte Marsalis: Cranford Mon, Delfino Lovett Other Clinician: Referring Marshae Azam: Kurtis Bushman Treating Chelsae Zanella/Extender: Frann Rider in Treatment: 0 Active  Inactive Electronic Signature(s) Signed: 05/29/2017 8:28:59 AM By: Gretta Cool, BSN, RN, CWS, Kim RN, BSN Previous Signature: 04/27/2017 5:55:00 PM Version By: Gretta Cool, BSN, RN, CWS, Kim RN, BSN Entered By: Gretta Cool, BSN, RN, CWS, Kim on 05/29/2017 08:28:59 Gabriela Sons. ([id number]) -------------------------------------------------------------------------------- Pain Assessment Details Patient Name: IKRAN, PATMAN. Date of Service: 04/27/2017 12:45 PM Medical Record Number: [id number] Patient Account Number: [id number] Date of Birth/Sex: 1926-04-24 (83 y.o. Female) Treating RN: Cornell Barman Primary Care Keila Turan: Cranford Mon, Delfino Lovett Other Clinician: Referring Rim Thatch: Kurtis Bushman Treating  Sanjuan Sawa/Extender: Frann Rider in Treatment: 0 Active Problems Location of Pain Severity and Description of Pain Patient Has Paino No Site Locations With Dressing Change: No Pain Management and Medication Current Pain Management: Goals for Pain Management Topical or injectable lidocaine is offered to patient for acute pain when surgical debridement is performed. If needed, Patient is instructed to use over the counter pain medication for the following 24-48 hours after debridement. Wound care MDs do not prescribed pain medications. Patient has chronic pain or uncontrolled pain. Patient has been instructed to make an appointment with their Primary Care Physician for pain management. Electronic Signature(s) Signed: 04/27/2017 5:55:00 PM By: Gretta Cool, BSN, RN, CWS, Kim RN, BSN Entered By: Gretta Cool, BSN, RN, CWS, Kim on 04/27/2017 13:00:14 [name], [name] ([id number]) -------------------------------------------------------------------------------- Patient/Caregiver Education Details Patient Name: DIANNA, EWALD. Date of Service: 04/27/2017 12:45 PM Medical Record Number: [id number] Patient Account Number: [id number] Date of Birth/Gender: 06/19/1926 (83 y.o. Female) Treating RN: Cornell Barman Primary Care Physician: Cranford Mon, Delfino Lovett Other Clinician: Referring Physician: Kurtis Bushman Treating Physician/Extender: Frann Rider in Treatment: 0 Education Assessment Education Provided To: Patient Education Topics Provided Welcome To [facility]: Handouts: Welcome To [facility] Methods: Demonstration, Explain/Verbal Responses: State content correctly Wound/Skin Impairment: Handouts: Caring for Your Ulcer Methods: Demonstration, Explain/Verbal Responses: State content correctly Electronic Signature(s) Signed: 04/27/2017 5:55:00 PM By: Gretta Cool, BSN, RN, CWS, Kim RN, BSN Entered By: Gretta Cool, BSN, RN, CWS, Kim on 04/27/2017 13:42:52 [name], [name]  ([id number]) -------------------------------------------------------------------------------- Wound Assessment Details Patient Name: ARRYN, TERRONES. Date of Service: 04/27/2017 12:45 PM Medical Record Number: [id number] Patient Account Number: [id number] Date of Birth/Sex: 1925-12-07 (83 y.o. Female) Treating RN: Cornell Barman Primary Care Larenda Reedy: Cranford Mon, Delfino Lovett Other Clinician: Referring Gissella Niblack: Kurtis Bushman Treating Caprice Mccaffrey/Extender: Frann Rider in Treatment: 0 Wound Status Wound Number: 1 Primary To be determined Etiology: Wound Location: Right Upper Leg - Proximal Wound Open Wounding Event: Gradually Appeared Status: Date Acquired: 01/13/2017 Comorbid Cataracts, Glaucoma, Anemia, Weeks Of Treatment: 0 History: Hypertension, Type II Diabetes, Clustered Wound: No Osteoarthritis Photos Photo Uploaded By: Gretta Cool, BSN, RN, CWS, Kim on 04/27/2017 13:46:22 Wound Measurements Length: (cm) 0.5 Width: (cm) 0.6 Depth: (cm) 0.1 Area: (cm) 0.236 Volume: (cm) 0.024 % Reduction in Area: % Reduction in Volume: Epithelialization: None Tunneling: No Undermining: No Wound Description Classification: Partial Thickness Wound Margin: Distinct, outline attached Exudate Amount: Large Exudate Type: Serous Exudate Color: amber Foul Odor After Cleansing: No Slough/Fibrino No Wound Bed Granulation Amount: Large (67-100%) Exposed Structure Necrotic Amount: None Present (0%) Fascia Exposed: No Fat Layer (Subcutaneous Tissue) Exposed: No Tendon Exposed: No Muscle Exposed: No Joint Exposed: No Forbush, Hilary M. ([id number]) Bone Exposed: No Periwound Skin Texture Texture Color No Abnormalities Noted: Yes No Abnormalities Noted: Yes Moisture Temperature / Pain No Abnormalities Noted: Yes Temperature: No Abnormality Tenderness on Palpation: Yes Wound Preparation Ulcer Cleansing: Rinsed/Irrigated with Saline Topical Anesthetic Applied: Other: lidocaine  4%, Electronic Signature(s) Signed: 04/27/2017  5:55:00 PM By: Gretta Cool, BSN, RN, CWS, Kim RN, BSN Entered By: Gretta Cool, BSN, RN, CWS, Kim on 04/27/2017 13:20:11 Mostafa, [name] ([id number]) -------------------------------------------------------------------------------- Vitals Details Patient Name: JONIYA, BOBERG. Date of Service: 04/27/2017 12:45 PM Medical Record Number: [id number] Patient Account Number: [id number] Date of Birth/Sex: 05-30-1926 (83 y.o. Female) Treating RN: Cornell Barman Primary Care Tryce Surratt: Cranford Mon, Delfino Lovett Other Clinician: Referring Wilberto Console: Kurtis Bushman Treating Artha Chiasson/Extender: Frann Rider in Treatment: 0 Vital Signs Time Taken: 13:01 Temperature (F): 98.5 Height (in): 63 Pulse (bpm): 67 Weight (lbs): 145 Respiratory Rate (breaths/min): 16 Source: Measured Blood Pressure (mmHg): 162/61 Body Mass Index (BMI): 25.7 Reference Range: 80 - 120 mg / dl Electronic Signature(s) Signed: 04/27/2017 5:55:00 PM By: Gretta Cool, BSN, RN, CWS, Kim RN, BSN Entered By: Gretta Cool, BSN, RN, CWS, Kim on 04/27/2017 13:02:35

## 2017-05-02 ENCOUNTER — Telehealth: Payer: Self-pay | Admitting: Family Medicine

## 2017-05-02 NOTE — Telephone Encounter (Signed)
They needs an order faxed for Medium Pads, Medium size Briefs, Chucks pads.  Fax [phone]  Thanks Con Memos

## 2017-05-02 NOTE — Telephone Encounter (Signed)
Is this ok?-Gabriela [name], RMA

## 2017-05-02 NOTE — Telephone Encounter (Signed)
Patient has appointment scheduled. Order faxed over-Eirene Rather Estell Harpin, RMA

## 2017-05-02 NOTE — Telephone Encounter (Signed)
Yes, she also needs an appointment this fall.

## 2017-05-04 ENCOUNTER — Ambulatory Visit: Payer: Medicare Other | Admitting: Surgery

## 2017-05-11 ENCOUNTER — Ambulatory Visit: Payer: Medicare Other

## 2017-05-11 ENCOUNTER — Encounter: Payer: Medicare Other | Admitting: Family Medicine

## 2017-05-12 ENCOUNTER — Other Ambulatory Visit: Payer: Self-pay | Admitting: Family Medicine

## 2017-05-12 NOTE — Telephone Encounter (Signed)
She can have one month supply--she needs appt.for further rf.

## 2017-05-17 NOTE — Telephone Encounter (Signed)
RX sent to pharmacy. Patient scheduled for a follow up on 06/13/17.

## 2017-06-09 ENCOUNTER — Other Ambulatory Visit: Payer: Self-pay | Admitting: Family Medicine

## 2017-06-13 ENCOUNTER — Ambulatory Visit (INDEPENDENT_AMBULATORY_CARE_PROVIDER_SITE_OTHER): Payer: Medicare Other | Admitting: Family Medicine

## 2017-06-13 ENCOUNTER — Encounter: Payer: Self-pay | Admitting: Family Medicine

## 2017-06-13 VITALS — BP 140/68 | HR 66 | Temp 98.1°F | Resp 14 | Wt 149.0 lb

## 2017-06-13 DIAGNOSIS — Z794 Long term (current) use of insulin: Secondary | ICD-10-CM | POA: Diagnosis not present

## 2017-06-13 DIAGNOSIS — I10 Essential (primary) hypertension: Secondary | ICD-10-CM | POA: Diagnosis not present

## 2017-06-13 DIAGNOSIS — E119 Type 2 diabetes mellitus without complications: Secondary | ICD-10-CM | POA: Diagnosis not present

## 2017-06-13 DIAGNOSIS — Z23 Encounter for immunization: Secondary | ICD-10-CM

## 2017-06-13 DIAGNOSIS — N393 Stress incontinence (female) (male): Secondary | ICD-10-CM

## 2017-06-13 LAB — COMPLETE METABOLIC PANEL WITH GFR
AG Ratio: 1.1 (calc) (ref 1.0–2.5)
ALBUMIN MSPROF: 4.2 g/dL (ref 3.6–5.1)
ALT: 6 U/L (ref 6–29)
AST: 14 U/L (ref 10–35)
Alkaline phosphatase (APISO): 99 U/L (ref 33–130)
BUN/Creatinine Ratio: 35 (calc) — ABNORMAL HIGH (ref 6–22)
BUN: 47 mg/dL — ABNORMAL HIGH (ref 7–25)
CALCIUM: 9.3 mg/dL (ref 8.6–10.4)
CO2: 20 mmol/L (ref 20–32)
CREATININE: 1.33 mg/dL — AB (ref 0.60–0.88)
Chloride: 110 mmol/L (ref 98–110)
GFR, EST NON AFRICAN AMERICAN: 35 mL/min/{1.73_m2} — AB (ref >=60)
GFR, Est African American: 41 mL/min/{1.73_m2} — ABNORMAL LOW (ref >=60)
GLOBULIN: 3.9 g/dL — AB (ref 1.9–3.7)
Glucose, Bld: 86 mg/dL (ref 65–99)
Potassium: 4.9 mmol/L (ref 3.5–5.3)
SODIUM: 140 mmol/L (ref 135–146)
Total Bilirubin: 0.5 mg/dL (ref 0.2–1.2)
Total Protein: 8.1 g/dL (ref 6.1–8.1)

## 2017-06-13 LAB — CBC WITH DIFFERENTIAL/PLATELET
BASOS ABS: 30 {cells}/uL (ref 0–200)
BASOS PCT: 0.6 %
EOS ABS: 40 {cells}/uL (ref 15–500)
Eosinophils Relative: 0.8 %
HCT: 28.6 % — ABNORMAL LOW (ref 35.0–45.0)
Hemoglobin: 9.3 g/dL — ABNORMAL LOW (ref 11.7–15.5)
Lymphs Abs: 2030 cells/uL (ref 850–3900)
MCH: 27.8 pg (ref 27.0–33.0)
MCHC: 32.5 g/dL (ref 32.0–36.0)
MCV: 85.6 fL (ref 80.0–100.0)
MPV: 12.1 fL (ref 7.5–12.5)
Monocytes Relative: 7.7 %
Neutro Abs: 2515 cells/uL (ref 1500–7800)
Neutrophils Relative %: 50.3 %
PLATELETS: 145 10*3/uL (ref 140–400)
RBC: 3.34 10*6/uL — ABNORMAL LOW (ref 3.80–5.10)
RDW: 13 % (ref 11.0–15.0)
TOTAL LYMPHOCYTE: 40.6 %
WBC: 5 10*3/uL (ref 3.8–10.8)
WBCMIX: 385 {cells}/uL (ref 200–950)

## 2017-06-13 LAB — TSH: TSH: 1.74 mIU/L (ref 0.40–4.50)

## 2017-06-13 LAB — POCT GLYCOSYLATED HEMOGLOBIN (HGB A1C): Hemoglobin A1C: 6

## 2017-06-13 NOTE — Progress Notes (Addendum)
[name] Female    DOB: [birth year]   83 y.o.   MRN: [id number] Visit Date: 06/13/2017  Today's Provider: Wilhemena Durie, MD   Chief Complaint  Patient presents with  . Diabetes  . Hypertension   Subjective:    HPI  Diabetes Mellitus Type II, Follow-up:   Lab Results  Component Value Date   HGBA1C 6.2 (H) 05/09/2016   HGBA1C 6.6 (H) 05/06/2015   HGBA1C 6.5 (A) 12/23/2014    Last seen for diabetes 4 months ago.  Management since then includes none. She reports good compliance with treatment. She is not having side effects.  Home blood sugar records: 100's  Episodes of hypoglycemia? no   Current Insulin Regimen: 20 units of the Levemir Eye exam: has one coming up  Pertinent Labs:    Component Value Date/Time   CHOL 111 05/09/2016 1502   TRIG 96 05/09/2016 1502   HDL 40 05/09/2016 1502   LDLCALC 52 05/09/2016 1502   CREATININE 1.66 (H) 05/09/2016 1502    Wt Readings from Last 3 Encounters:  06/13/17 149 lb (67.6 kg)  11/01/16 142 lb (64.4 kg)  05/09/16 145 lb (65.8 kg)    ------------------------------------------------------------------------    Allergies  Allergen Reactions  . Sulfa Antibiotics Rash     Current Outpatient Prescriptions:  .  ACCU-CHEK AVIVA PLUS test strip, USE TO TEST SUGAR TWICE A DAY, Disp: 100 each, Rfl: 3 .  aspirin EC 325 MG EC tablet, Take 1 tablet (325 mg total) by mouth daily., Disp: 30 tablet, Rfl: 0 .  gabapentin (NEURONTIN) 100 MG capsule, TAKE ONE CAPSULE BY MOUTH TWICE A DAY, Disp: 180 capsule, Rfl: 3 .  hydrochlorothiazide (HYDRODIURIL) 25 MG tablet, TAKE 1 TABLET BY MOUTH ONCE A DAY, Disp: 90 tablet, Rfl: 0 .  latanoprost (XALATAN) 0.005 % ophthalmic solution, PLACE 1 DROP INTO BOTH EYES EVERY NIGHT AT BEDTIME, Disp: , Rfl: 4 .  LEVEMIR FLEXTOUCH 100 UNIT/ML Pen, INJECT 20 UNITS SUBCUTANEOUSLY DAILY, Disp: 15 pen, Rfl: 0 .  metoprolol tartrate (LOPRESSOR) 25 MG tablet, TAKE 1/2 TABLET BY  MOUTH DAILY, Disp: 45 tablet, Rfl: 4 .  pioglitazone (ACTOS) 45 MG tablet, TAKE 1 TABLET BY MOUTH DAILY, Disp: 30 tablet, Rfl: 0 .  Blood Glucose Monitoring Suppl (ACCU-CHEK AVIVA PLUS) w/Device KIT, 1 each by Does not apply route daily. Dx E11.9, Disp: 1 kit, Rfl: 0 .  brompheniramine-pseudoephedrine-DM 30-2-10 MG/5ML syrup, Take 5 mLs by mouth 3 (three) times daily as needed. (Patient not taking: Reported on 06/13/2017), Disp: 120 mL, Rfl: 0 .  doxycycline (VIBRA-TABS) 100 MG tablet, Take 1 tablet (100 mg total) by mouth 2 (two) times daily. (Patient not taking: Reported on 06/13/2017), Disp: 14 tablet, Rfl: 0 .  glucose blood (ACCU-CHEK AVIVA PLUS) test strip, Test glucose TWICE DAILY DX: E11.9, Disp: 100 each, Rfl: 2 .  glucose blood (BAYER CONTOUR NEXT TEST) test strip, Check sugar twice daily DX E11.9, Disp: 100 each, Rfl: 12 .  hydrochlorothiazide (HYDRODIURIL) 25 MG tablet, TAKE 1 TABLET BY MOUTH ONCE A DAY, Disp: 30 tablet, Rfl: 0 .  meloxicam (MOBIC) 7.5 MG tablet, Take 7.5 mg by mouth daily. with food, Disp: , Rfl: 2 .  prednisoLONE acetate (PRED FORTE) 1 % ophthalmic suspension, PLACE 1 DROP IN LEFT EYE 4 TIMES A DAY, Disp: , Rfl: 4 .  SIMBRINZA 1-0.2 % SUSP, INSTILL 1 DROP INTO BOTH EYES TWICE A DAY, Disp: , Rfl: 4 .  SURE  COMFORT PEN NEEDLES 31G X 5 MM MISC, USE AS DIRECTED **PATIENT NEEDS APPOINTMENT FOR FOLLOW UP**, Disp: 100 each, Rfl: 1 .  SURE COMFORT PEN NEEDLES 31G X 5 MM MISC, USE AS DIRECTED **PATIENT NEEDS APPOINTMENT FOR FOLLOW UP**, Disp: 100 each, Rfl: 0 .  travoprost, benzalkonium, (TRAVATAN) 0.004 % ophthalmic solution, Place 1 drop into both eyes at bedtime., Disp: , Rfl:   Review of Systems  Constitutional: Negative.   HENT: Negative.   Eyes: Negative.   Respiratory: Negative.   Cardiovascular: Negative.   Gastrointestinal: Negative.   Endocrine: Negative.   Genitourinary: Negative.   Musculoskeletal: Negative.   Skin: Negative.   Allergic/Immunologic:  Negative.   Neurological: Negative.   Hematological: Negative.   Psychiatric/Behavioral: Negative.     Social History  Substance Use Topics  . Smoking status: Never Smoker  . Smokeless tobacco: Never Used  . Alcohol use No   Objective:   BP 140/68 (BP Location: Left Arm, Patient Position: Sitting, Cuff Size: Normal)   Pulse 66   Temp 98.1 F (36.7 C) (Oral)   Resp 14   Wt 149 lb (67.6 kg)   BMI 26.19 kg/m  Vitals:   06/13/17 1134  BP: 140/68  Pulse: 66  Resp: 14  Temp: 98.1 F (36.7 C)  TempSrc: Oral  Weight: 149 lb (67.6 kg)     Physical Exam  Constitutional: She is oriented to person, place, and time. She appears well-developed and well-nourished.  HENT:  Head: Normocephalic and atraumatic.  Right Ear: External ear normal.  Left Ear: External ear normal.  Nose: Nose normal.  Mouth/Throat: Oropharynx is clear and moist.  Eyes: Pupils are equal, round, and reactive to light. Conjunctivae and EOM are normal.  Neck: Normal range of motion. Neck supple.  Cardiovascular: Normal rate, regular rhythm, normal heart sounds and intact distal pulses.   Pulmonary/Chest: Effort normal and breath sounds normal.  Genitourinary:  Genitourinary Comments: Breast/pelvic/DRE deferred to to age/health.  Musculoskeletal: Normal range of motion.  Neurological: She is alert and oriented to person, place, and time. She has normal reflexes.  Skin: Skin is warm and dry.  Psychiatric: She has a normal mood and affect. Her behavior is normal. Judgment and thought content normal.        Assessment & Plan:     1. Type 2 diabetes mellitus without complication, with long-term current use of insulin (HCC)  - Comprehensive metabolic panel - POCT HgB A1C 6.0 today. Stable. Continue current medications  2. Need for influenza vaccination  - Flu vaccine HIGH DOSE PF  3. Essential hypertension  - CBC with Differential/Platelet - TSH 4.CAD/s/p CABG 5.Stress urinary Incontinence Pt  wears pads daily.    HPI, Exam, and A&P Transcribed under the direction and in the presence of Richard L. Cranford Mon, MD  Electronically Signed: Katina Dung, CMA  I have done the exam and reviewed the above chart and it is accurate to the best of my knowledge. Development worker, community has been used in this note in any air is in the dictation or transcription are unintentional.  Wilhemena Durie, MD  [place]

## 2017-06-14 ENCOUNTER — Telehealth: Payer: Self-pay

## 2017-06-14 NOTE — Telephone Encounter (Signed)
-----   Message from Jerrol Banana., MD sent at 06/14/2017  2:34 PM EDT ----- Labs stable.

## 2017-06-14 NOTE — Telephone Encounter (Signed)
Line busy, will attempt to contact later.

## 2017-06-15 ENCOUNTER — Other Ambulatory Visit: Payer: Self-pay | Admitting: Family Medicine

## 2017-06-15 MED ORDER — HYDROCHLOROTHIAZIDE 25 MG PO TABS: 25.0000 mg | ORAL_TABLET | Freq: Every day | ORAL | 3 refills | Status: DC

## 2017-06-15 NOTE — Telephone Encounter (Signed)
[facility] pharmacy faxed a request for a 90-days supply for the following medication. Thanks CC  hydrochlorothiazide (HYDRODIURIL) 25 MG tablet

## 2017-06-16 NOTE — Telephone Encounter (Signed)
Pt advised-Gabriela [name], RMA  

## 2017-06-20 ENCOUNTER — Telehealth: Payer: Self-pay | Admitting: Family Medicine

## 2017-06-20 NOTE — Telephone Encounter (Signed)
Dr Rosanna Randy can you add with addendum to the note from recent visit. Thank Edrick Kins, RMA

## 2017-06-20 NOTE — Telephone Encounter (Signed)
Howard with [employer] received the order and office note for pt to get incontinence pads but it doesn't stated in the note why she needs the pad. Nadara Mustard is requesting we send him a stated as to why pt needs the pads in order for Medicare to cover the pads. Fax# [phone]. Please advise. Thanks TNP

## 2017-07-11 NOTE — Telephone Encounter (Signed)
Done on last note.--06/13/17

## 2017-07-12 ENCOUNTER — Encounter: Payer: Medicare Other | Attending: Surgery | Admitting: Internal Medicine

## 2017-07-12 ENCOUNTER — Other Ambulatory Visit
Admission: RE | Admit: 2017-07-12 | Discharge: 2017-07-12 | Disposition: A | Discharge status: Home / Self Care | Payer: Medicare Other | Source: Ambulatory Visit | Attending: Internal Medicine | Admitting: Internal Medicine

## 2017-07-12 ENCOUNTER — Telehealth: Payer: Self-pay | Admitting: Family Medicine

## 2017-07-12 DIAGNOSIS — Z7982 Long term (current) use of aspirin: Secondary | ICD-10-CM | POA: Insufficient documentation

## 2017-07-12 DIAGNOSIS — M1990 Unspecified osteoarthritis, unspecified site: Secondary | ICD-10-CM | POA: Diagnosis not present

## 2017-07-12 DIAGNOSIS — Z79899 Other long term (current) drug therapy: Secondary | ICD-10-CM | POA: Diagnosis not present

## 2017-07-12 DIAGNOSIS — B999 Unspecified infectious disease: Secondary | ICD-10-CM | POA: Diagnosis not present

## 2017-07-12 DIAGNOSIS — Z794 Long term (current) use of insulin: Secondary | ICD-10-CM | POA: Insufficient documentation

## 2017-07-12 DIAGNOSIS — L97119 Non-pressure chronic ulcer of right thigh with unspecified severity: Secondary | ICD-10-CM | POA: Diagnosis not present

## 2017-07-12 DIAGNOSIS — E11622 Type 2 diabetes mellitus with other skin ulcer: Secondary | ICD-10-CM | POA: Insufficient documentation

## 2017-07-12 DIAGNOSIS — L97112 Non-pressure chronic ulcer of right thigh with fat layer exposed: Secondary | ICD-10-CM | POA: Diagnosis not present

## 2017-07-12 DIAGNOSIS — L02415 Cutaneous abscess of right lower limb: Secondary | ICD-10-CM | POA: Diagnosis not present

## 2017-07-12 DIAGNOSIS — Z951 Presence of aortocoronary bypass graft: Secondary | ICD-10-CM | POA: Insufficient documentation

## 2017-07-12 DIAGNOSIS — I10 Essential (primary) hypertension: Secondary | ICD-10-CM | POA: Insufficient documentation

## 2017-07-12 NOTE — Telephone Encounter (Signed)
Information faxed to [place], RMA

## 2017-07-12 NOTE — Telephone Encounter (Signed)
Spoke with patient, she was asking if we faxed over order and information about her needing pads. Advised patient I faxed this earlier this morning and apologized for the [employer], RMA

## 2017-07-12 NOTE — Telephone Encounter (Signed)
Pt is requesting to speak with one of Dr. Alben Spittle nurses. Pt stated that she needs to discuss some business that was discussed the last time she spoke with the nurse. Pt would not give more details. Please advise. Thanks TNP

## 2017-07-14 DIAGNOSIS — I10 Essential (primary) hypertension: Secondary | ICD-10-CM | POA: Diagnosis not present

## 2017-07-14 DIAGNOSIS — L97112 Non-pressure chronic ulcer of right thigh with fat layer exposed: Secondary | ICD-10-CM | POA: Diagnosis not present

## 2017-07-14 DIAGNOSIS — Z7982 Long term (current) use of aspirin: Secondary | ICD-10-CM | POA: Diagnosis not present

## 2017-07-14 DIAGNOSIS — Z794 Long term (current) use of insulin: Secondary | ICD-10-CM | POA: Diagnosis not present

## 2017-07-14 DIAGNOSIS — E11622 Type 2 diabetes mellitus with other skin ulcer: Secondary | ICD-10-CM | POA: Diagnosis not present

## 2017-07-14 DIAGNOSIS — Z96641 Presence of right artificial hip joint: Secondary | ICD-10-CM | POA: Diagnosis not present

## 2017-07-14 DIAGNOSIS — Z791 Long term (current) use of non-steroidal anti-inflammatories (NSAID): Secondary | ICD-10-CM | POA: Diagnosis not present

## 2017-07-14 NOTE — Progress Notes (Signed)
Brisbin, Etheline M. ([id number]) Visit Report for 07/12/2017 Allergy List Details Patient Name: Gabriela [name], [name] Date of Service: 07/12/2017 8:45 AM Medical Record Number: [id number] Patient Account Number: [id number] Date of Birth/Sex: [birth year] (83 y.o. Female) Treating RN: Carolyne Fiscal, Debi Primary Care Lakendrick Paradis: Cranford Mon, Delfino Lovett Other Clinician: Referring Eun Vermeer: Cranford Mon, RICHARD Treating Suha Schoenbeck/Extender: Ricard Dillon Weeks in Treatment: 0 Allergies Active Allergies No Known Drug Allergies Allergy Notes Electronic Signature(s) Signed: 07/13/2017 2:54:02 PM By: Alric Quan Entered By: Alric Quan on 07/12/2017 08:51:16 Blessinger, Jamal M. ([id number]) -------------------------------------------------------------------------------- Arrival Information Details Patient Name: JACQUILINE, ZURCHER. Date of Service: 07/12/2017 8:45 AM Medical Record Number: [id number] Patient Account Number: [id number] Date of Birth/Sex: 1925/10/17 (83 y.o. Female) Treating RN: Carolyne Fiscal, Debi Primary Care Toni Demo: Cranford Mon, Delfino Lovett Other Clinician: Referring Jaylan Hinojosa: Cranford Mon, RICHARD Treating Belen Zwahlen/Extender: Tito Dine in Treatment: 0 Visit Information Patient Arrived: Walker Arrival Time: 08:47 Accompanied By: self Transfer Assistance: EasyPivot Patient Lift Patient Identification Verified: Yes Secondary Verification Process Yes Completed: Patient Requires Transmission-Based No Precautions: Patient Has Alerts: Yes Patient Alerts: DM II History Since Last Visit All ordered tests and consults were completed: No Added or deleted any medications: No Any new allergies or adverse reactions: No Had a fall or experienced change in activities of daily living that may affect risk of falls: No Signs or symptoms of abuse/neglect since last visito No Hospitalized since last visit: No Electronic Signature(s) Signed: 07/13/2017 2:54:02 PM By: Alric Quan Entered By: Alric Quan on 07/12/2017 08:48:03 Alberg, Nahlia M. ([id number]) -------------------------------------------------------------------------------- Clinic Level of Care Assessment Details Patient Name: IRAIDA, CRAGIN. Date of Service: 07/12/2017 8:45 AM Medical Record Number: [id number] Patient Account Number: [id number] Date of Birth/Sex: 1926/06/22 (83 y.o. Female) Treating RN: Carolyne Fiscal, Debi Primary Care Ajai Harville: Cranford Mon, Delfino Lovett Other Clinician: Referring Everett Ehrler: Cranford Mon, RICHARD Treating Kennedy Bohanon/Extender: Tito Dine in Treatment: 0 Clinic Level of Care Assessment Items TOOL 1 Quantity Score X - Use when EandM and Procedure is performed on INITIAL visit 1 0 ASSESSMENTS - Nursing Assessment / Reassessment []  - General Physical Exam (combine w/ comprehensive assessment (listed just below) when 0 performed on new pt. evals) []  - 0 Comprehensive Assessment (HX, ROS, Risk Assessments, Wounds Hx, etc.) ASSESSMENTS - Wound and Skin Assessment / Reassessment []  - Dermatologic / Skin Assessment (not related to wound area) 0 ASSESSMENTS - Ostomy and/or Continence Assessment and Care []  - Incontinence Assessment and Management 0 []  - 0 Ostomy Care Assessment and Management (repouching, etc.) PROCESS - Coordination of Care []  - Simple Patient / Family Education for ongoing care 0 X- 1 20 Complex (extensive) Patient / Family Education for ongoing care X- 1 10 Staff obtains Programmer, systems, Records, Test Results / Process Orders X- 1 10 Staff telephones HHA, Nursing Homes / Clarify orders / etc []  - 0 Routine Transfer to another Facility (non-emergent condition) []  - 0 Routine Hospital Admission (non-emergent condition) X- 1 15 New Admissions / Biomedical engineer / Ordering NPWT, Apligraf, etc. []  - 0 Emergency Hospital Admission (emergent condition) PROCESS - Special Needs []  - Pediatric / Minor Patient Management 0 []  - 0 Isolation  Patient Management []  - 0 Hearing / Language / Visual special needs []  - 0 Assessment of Community assistance (transportation, D/C planning, etc.) []  - 0 Additional assistance / Altered mentation []  - 0 Support Surface(s) Assessment (bed, cushion, seat, etc.) Flinchbaugh, Ishitha M. ([id number]) INTERVENTIONS - Miscellaneous []  - External ear exam 0 []  - 0 Patient Transfer (multiple staff /  Hoyer Lift / Similar devices) []  - 0 Simple Staple / Suture removal (25 or less) []  - 0 Complex Staple / Suture removal (26 or more) []  - 0 Hypo/Hyperglycemic Management (do not check if billed separately) []  - 0 Ankle / Brachial Index (ABI) - do not check if billed separately Has the patient been seen at the hospital within the last three years: Yes Total Score: 55 Level Of Care: New/Established - Level 2 Electronic Signature(s) Signed: 07/13/2017 2:54:02 PM By: Alric Quan Entered By: Alric Quan on 07/12/2017 11:14:36 Hazan, Lilinoe M. ([id number]) -------------------------------------------------------------------------------- Encounter Discharge Information Details Patient Name: CATHERYNE, DEFORD. Date of Service: 07/12/2017 8:45 AM Medical Record Number: [id number] Patient Account Number: [id number] Date of Birth/Sex: 1926/04/13 (83 y.o. Female) Treating RN: Carolyne Fiscal, Debi Primary Care Kursten Kruk: Cranford Mon, Delfino Lovett Other Clinician: Referring Davionte Lusby: Cranford Mon, RICHARD Treating Azir Muzyka/Extender: Tito Dine in Treatment: 0 Encounter Discharge Information Items Discharge Pain Level: 0 Discharge Condition: Stable Ambulatory Status: Walker Discharge Destination: Home Private Transportation: Auto Schedule Follow-up Appointment: No Medication Reconciliation completed and provided No to Patient/Care Avory Mimbs: Clinical Summary of Care: Electronic Signature(s) Signed: 07/13/2017 2:54:02 PM By: Alric Quan Entered By: Alric Quan on 07/12/2017  09:26:04 Dupee, Annais M. ([id number]) -------------------------------------------------------------------------------- Lower Extremity Assessment Details Patient Name: NATORIA, ARCHIBALD. Date of Service: 07/12/2017 8:45 AM Medical Record Number: [id number] Patient Account Number: [id number] Date of Birth/Sex: October 21, 1925 (83 y.o. Female) Treating RN: Carolyne Fiscal, Debi Primary Care Rhet Rorke: Cranford Mon, Delfino Lovett Other Clinician: Referring Ki Luckman: Cranford Mon, RICHARD Treating Kellsey Sansone/Extender: Ricard Dillon Weeks in Treatment: 0 Electronic Signature(s) Signed: 07/13/2017 2:54:02 PM By: Alric Quan Entered By: Alric Quan on 07/12/2017 08:54:56 Keadle, Ronesha M. ([id number]) -------------------------------------------------------------------------------- Multi Wound Chart Details Patient Name: HENYA, AGUALLO. Date of Service: 07/12/2017 8:45 AM Medical Record Number: [id number] Patient Account Number: [id number] Date of Birth/Sex: 01/12/1926 (83 y.o. 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] Comorbid History: [2:Cataracts, Glaucoma, Anemia, N/A Hypertension, Type II Diabetes, Osteoarthritis] Date Acquired: [2:04/11/2017] [N/A:N/A] Weeks of Treatment: [2:0] [N/A:N/A] Wound Status: [2:Open] [N/A:N/A] Measurements L x W x D [2:1x0.8x0.1] [N/A:N/A] (cm) Area (cm) : [2:0.628] [N/A:N/A] Volume (cm) : [2:0.063] [N/A:N/A] Classification:  [2:Grade 1] [N/A:N/A] Exudate Amount: [2:Large] [N/A:N/A] Exudate Type: [2:Serous] [N/A:N/A] Exudate Color: [2:amber] [N/A:N/A] Wound Margin: [2:Distinct, outline attached] [N/A:N/A] Granulation Amount: [2:None Present (0%)] [N/A:N/A] Necrotic Amount: [2:Small (1-33%)] [N/A:N/A] Epithelialization: [2:None] [N/A:N/A] Periwound Skin Texture: [2:No Abnormalities Noted] [N/A:N/A] Periwound Skin Moisture: [2:Maceration: Yes] [N/A:N/A] Periwound Skin Color: [2:No Abnormalities Noted] [N/A:N/A] Temperature: [2:No Abnormality] [N/A:N/A] Tenderness on Palpation: [2:Yes] [N/A:N/A] Wound Preparation: [2:Ulcer Cleansing: Rinsed/Irrigated with Saline] [N/A:N/A] Topical Anesthetic Applied: Other: lidocaine 4% Procedures Performed: Incision and Drainage N/A N/A Treatment Notes Moro, Joda M. ([id number]) Wound #2 (Right Upper Leg) 1. Cleansed with: Clean wound with Normal Saline 2. Anesthetic Topical Lidocaine 4% cream to wound bed prior to debridement 3. Peri-wound Care: Skin Prep 4. Dressing Applied: Other dressing (specify in notes) 5. Secondary Dressing Applied Dry [place] Notes silvercel Electronic Signature(s) Signed: 07/12/2017 5:42:06 PM By: Linton Ham MD Entered By: Linton Ham on 07/12/2017 09:32:01 Johanning, Cartha M. ([id number]) -------------------------------------------------------------------------------- [place]  Details Patient Name: SOLACE, MANWARREN. Date of Service: 07/12/2017 8:45 AM Medical Record Number: [id number] Patient Account Number: [id number] Date of Birth/Sex: 01/12/1926 (83 y.o. Female) Treating RN: Carolyne Fiscal, Debi Primary Care Grisela Mesch: Cranford Mon, Delfino Lovett Other Clinician: Referring Koury Roddy: Cranford Mon, RICHARD Treating Akeela Busk/Extender: Tito Dine in Treatment: 0 Active Inactive ` Abuse / Safety / Falls / Self Care Management Nursing Diagnoses: Potential for falls Goals: Patient will not  experience any injury related to falls Date Initiated: 07/12/2017 Target Resolution Date: 10/21/2017 Goal Status: Active Interventions: Assess Activities of Daily Living upon admission and as needed Assess fall risk on admission and as needed Assess: immobility, friction, shearing, incontinence upon admission and as needed Assess impairment of mobility on admission and as needed per policy Notes: ` Nutrition Nursing Diagnoses: Imbalanced nutrition Impaired glucose control: actual or potential Potential for alteratiion in Nutrition/Potential for imbalanced nutrition Goals: Patient/caregiver agrees to and verbalizes understanding of need to use nutritional supplements and/or vitamins as prescribed Date Initiated: 07/12/2017 Target Resolution Date: 09/23/2017 Goal Status: Active Patient/caregiver will maintain therapeutic glucose control Date Initiated: 07/12/2017 Target Resolution Date: 09/23/2017 Goal Status: Active Interventions: Assess patient nutrition upon admission and as needed per policy Provide education on elevated blood sugars and impact on wound healing Notes: ` Orientation to the Wound Care Program Werling, Dwanda M. ([id number]) Nursing Diagnoses: Knowledge deficit related to the wound healing center program Goals: Patient/caregiver will verbalize understanding of the [place] Date Initiated: 07/12/2017 Target Resolution Date: 07/22/2017 Goal Status: Active Interventions: Provide education on orientation to the wound center Notes: ` Pain, Acute or Chronic Nursing Diagnoses: Pain, acute or chronic: actual or potential Potential alteration in comfort, pain Goals: Patient/caregiver will verbalize adequate pain control between visits Date Initiated: 07/12/2017 Target Resolution Date: 10/21/2017 Goal Status: Active Interventions: Complete pain assessment as per visit requirements Notes: ` Wound/Skin Impairment Nursing Diagnoses: Impaired  tissue integrity Knowledge deficit related to ulceration/compromised skin integrity Goals: Ulcer/skin breakdown will have a volume reduction of 80% by week 12 Date Initiated: 07/12/2017 Target Resolution Date: 10/14/2017 Goal Status: Active Interventions: Assess patient/caregiver ability to perform ulcer/skin care regimen upon admission and as needed Assess ulceration(s) every visit Notes: Electronic Signature(s) Signed: 07/13/2017 2:54:02 PM By: Alric Quan Entered By: Alric Quan on 07/12/2017 09:16:07 Zettlemoyer, Sulay M. ([id number]) Fentress, Savannha M. ([id number]) -------------------------------------------------------------------------------- Pain Assessment Details Patient Name: LEVEDA, KENDRIX. Date of Service: 07/12/2017 8:45 AM Medical Record Number: [id number] Patient Account Number: [id number] Date of Birth/Sex: [birth year] (83 y.o. Female) Treating RN: Carolyne Fiscal, Debi Primary Care Dagmawi Venable: Cranford Mon, Delfino Lovett Other Clinician: Referring Crosby Bevan: Cranford Mon, RICHARD Treating Jacobie Stamey/Extender: Tito Dine in Treatment: 0 Active Problems Location of Pain Severity and Description of Pain Patient Has Paino No Site Locations Pain Management and Medication Current Pain Management: Electronic Signature(s) Signed: 07/13/2017 2:54:02 PM By: Alric Quan Entered By: Alric Quan on 07/12/2017 08:48:07 Rookstool, Fayne M. ([id number]) -------------------------------------------------------------------------------- Patient/Caregiver Education Details Patient Name: JESSEKA, DRINKARD. Date of Service: 07/12/2017 8:45 AM Medical Record Number: [id number] Patient Account Number: [id number] Date of Birth/Gender: [birth year] (83 y.o. Female) Treating RN: Carolyne Fiscal, Debi Primary Care Physician: Cranford Mon, Delfino Lovett Other Clinician: Referring Physician: Wilhemena Durie Treating Physician/Extender: Tito Dine in Treatment: 0 Education  Assessment Education Provided To: Patient Education Topics Provided Elevated Blood Sugar/ Impact on Healing: Handouts: Elevated Blood Sugars: How Do They Affect Wound Healing Methods: Explain/Verbal Responses: State content correctly Welcome To [facility]: Handouts: Welcome To [facility]  Methods: Explain/Verbal Responses: State content correctly Wound/Skin Impairment: Handouts: Caring for Your Ulcer, Other: change dressing as ordered Methods: Demonstration, Explain/Verbal Responses: State content correctly Electronic Signature(s) Signed: 07/13/2017 2:54:02 PM By: Alric Quan Entered By: Alric Quan on 07/12/2017 09:26:31 Pesnell, Karry M. ([id number]) -------------------------------------------------------------------------------- Wound Assessment Details Patient Name: KAMILAH, CORREIA. Date of Service: 07/12/2017 8:45 AM Medical Record Number: [id number] Patient Account Number: [id number] Date of Birth/Sex: 1925/12/07 (83 y.o. Female) Treating RN: Carolyne Fiscal, Debi Primary Care Hussien Greenblatt: Cranford Mon, Delfino Lovett Other Clinician: Referring Renate Danh: Cranford Mon, RICHARD Treating Maiah Sinning/Extender: Tito Dine in Treatment: 0 Wound Status Wound Number: 2 Primary Diabetic Wound/Ulcer of the Lower Extremity Etiology: Wound Location: Right Upper Leg Wound Open Wounding Event: Gradually Appeared Status: Date Acquired: 04/11/2017 Comorbid Cataracts, Glaucoma, Anemia, Hypertension, Weeks Of Treatment: 0 History: Type II Diabetes, Osteoarthritis Clustered Wound: No Photos Photo Uploaded By: Alric Quan on 07/12/2017 15:57:21 Wound Measurements Length: (cm) 1 Width: (cm) 0.8 Depth: (cm) 0.1 Area: (cm) 0.628 Volume: (cm) 0.063 % Reduction in Area: % Reduction in Volume: Epithelialization: None Tunneling: No Undermining: No Wound Description Classification: Grade 1 Wound Margin: Distinct, outline attached Exudate Amount:  Large Exudate Type: Serous Exudate Color: amber Foul Odor After Cleansing: No Slough/Fibrino Yes Wound Bed Granulation Amount: None Present (0%) Necrotic Amount: Small (1-33%) Necrotic Quality: Adherent Slough Periwound Skin Texture Texture Color No Abnormalities Noted: No No Abnormalities Noted: No Moisture Temperature / Pain No Abnormalities Noted: No Temperature: No Abnormality Petruska, Darnell M. ([id number]) Maceration: Yes Tenderness on Palpation: Yes Wound Preparation Ulcer Cleansing: Rinsed/Irrigated with Saline Topical Anesthetic Applied: Other: lidocaine 4%, Treatment Notes Wound #2 (Right Upper Leg) 1. Cleansed with: Clean wound with Normal Saline 2. Anesthetic Topical Lidocaine 4% cream to wound bed prior to debridement 3. Peri-wound Care: Skin Prep 4. Dressing Applied: Other dressing (specify in notes) 5. Secondary Dressing Applied Dry [place] Notes silvercel Electronic Signature(s) Signed: 07/13/2017 2:54:02 PM By: Alric Quan Entered By: Alric Quan on 07/12/2017 09:01:48 Lapinsky, Maelee M. ([id number]) -------------------------------------------------------------------------------- Vitals Details Patient Name: ZAREENA, WILLIS. Date of Service: 07/12/2017 8:45 AM Medical Record Number: [id number] Patient Account Number: [id number] Date of Birth/Sex: 09/05/1925 (83 y.o. Female) Treating RN: Carolyne Fiscal, Debi Primary Care Zhion Pevehouse: Cranford Mon, Delfino Lovett Other Clinician: Referring Zanyia Silbaugh: Cranford Mon, RICHARD Treating Garrell Flagg/Extender: Tito Dine in Treatment: 0 Vital Signs Time Taken: 08:48 Temperature (F): 98.0 Height (in): 63 Pulse (bpm): 68 Source: Stated Respiratory Rate (breaths/min): 16 Weight (lbs): 145 Blood Pressure (mmHg): 160/55 Source: Measured Reference Range: 80 - 120 mg / dl Body Mass Index (BMI): 25.7 Electronic Signature(s) Signed: 07/13/2017 2:54:02 PM By: Alric Quan Entered By:  Alric Quan on 07/12/2017 08:50:46

## 2017-07-14 NOTE — Progress Notes (Signed)
Saad, Genowefa M. ([id number]) Visit Report for 07/12/2017 Abuse/Suicide Risk Screen Details Patient Name: Gabriela [name], [name] Date of Service: 07/12/2017 8:45 AM Medical Record Number: [id number] Patient Account Number: [id number] Date of Birth/Sex: 04/10/1926 (83 y.o. Female) Treating RN: Carolyne Fiscal, Debi Primary Care Anjelina Dung: Cranford Mon, Delfino Lovett Other Clinician: Referring Nacole Fluhr: Cranford Mon, RICHARD Treating Deziah Renwick/Extender: Tito Dine in Treatment: 0 Abuse/Suicide Risk Screen Items Answer ABUSE/SUICIDE RISK SCREEN: Has anyone close to you tried to hurt or harm you recentlyo No Do you feel uncomfortable with anyone in your familyo No Has anyone forced you do things that you didnot want to doo No Do you have any thoughts of harming yourselfo No Patient displays signs or symptoms of abuse and/or neglect. No Electronic Signature(s) Signed: 07/13/2017 2:54:02 PM By: Alric Quan Entered By: Alric Quan on 07/12/2017 08:53:15 Gabriela [name], [name] ([id number]) -------------------------------------------------------------------------------- Activities of Daily Living Details Patient Name: [name] Date of Service: 07/12/2017 8:45 AM Medical Record Number: [id number] Patient Account Number: [id number] Date of Birth/Sex: [birth year] (83 y.o. Female) Treating RN: Carolyne Fiscal, Debi Primary Care Wentworth Edelen: Cranford Mon, Delfino Lovett Other Clinician: Referring Kymiah Araiza: Cranford Mon, RICHARD Treating Elyssia Strausser/Extender: Tito Dine in Treatment: 0 Activities of Daily Living Items Answer Activities of Daily Living (Please select one for each item) Drive Automobile Not Able Take Medications Completely Able Use Telephone Completely Able Care for Appearance Completely Able Use Toilet Completely Able Bath / Shower Completely Able Dress Self Completely Able Feed Self Completely Able Walk Need Assistance Get In / Out Bed Completely Able Housework Not  Able Prepare Meals Need Assistance Handle Money Need Assistance Shop for Self Not Able Electronic Signature(s) Signed: 07/13/2017 2:54:02 PM By: Alric Quan Entered By: Alric Quan on 07/12/2017 08:53:52 Gabriela [name], [name] ([id number]) -------------------------------------------------------------------------------- Education Assessment Details Patient Name: Gabriela [name], [name] Date of Service: 07/12/2017 8:45 AM Medical Record Number: [id number] Patient Account Number: [id number] Date of Birth/Sex: [birth year] (83 y.o. Female) Treating RN: Carolyne Fiscal, Debi Primary Care Nirel Babler: Cranford Mon, Delfino Lovett Other Clinician: Referring Jaia Alonge: Cranford Mon, RICHARD Treating Braelyn Jenson/Extender: Tito Dine in Treatment: 0 Primary Learner Assessed: Patient Learning Preferences/Education Level/Primary Language Learning Preference: Explanation, Printed Material Highest Education Level: High School Preferred Language: English Cognitive Barrier Assessment/Beliefs Language Barrier: No Translator Needed: No Memory Deficit: No Emotional Barrier: No Cultural/Religious Beliefs Affecting Medical Care: No Physical Barrier Assessment Impaired Vision: Yes Glasses Impaired Hearing: No Decreased Hand dexterity: No Knowledge/Comprehension Assessment Knowledge Level: Medium Comprehension Level: Medium Ability to understand written Medium instructions: Ability to understand verbal Medium instructions: Motivation Assessment Anxiety Level: Calm Cooperation: Cooperative Education Importance: Acknowledges Need Interest in Health Problems: Asks Questions Perception: Coherent Willingness to Engage in Self- Medium Management Activities: Readiness to Engage in Self- Medium Management Activities: Electronic Signature(s) Signed: 07/13/2017 2:54:02 PM By: Alric Quan Entered By: Alric Quan on 07/12/2017 08:54:12 Gabriela [name], [name]  ([id number]) -------------------------------------------------------------------------------- Fall Risk Assessment Details Patient Name: Gabriela [name], [name] Date of Service: 07/12/2017 8:45 AM Medical Record Number: [id number] Patient Account Number: [id number] Date of Birth/Sex: 1926/04/02 (83 y.o. Female) Treating RN: Carolyne Fiscal, Debi Primary Care Rondi Ivy: Cranford Mon, Delfino Lovett Other Clinician: Referring Shamaya Kauer: Cranford Mon, RICHARD Treating Segundo Makela/Extender: Tito Dine in Treatment: 0 Fall Risk Assessment Items Have you had 2 or more falls in the last 12 monthso 0 No Have you had any fall that resulted in injury in the last 12 monthso 0 No FALL RISK ASSESSMENT: History of falling - immediate or within 3 months 0 No Secondary diagnosis 15 Yes Ambulatory  aid None/bed rest/wheelchair/nurse 0 No Crutches/cane/walker 15 Yes Furniture 0 No IV Access/Saline Lock 0 No Gait/Training Normal/bed rest/immobile 0 No Weak 0 No Impaired 20 Yes Mental Status Oriented to own ability 0 Yes Electronic Signature(s) Signed: 07/13/2017 2:54:02 PM By: Alric Quan Entered By: Alric Quan on 07/12/2017 08:54:34 Gabriela [name], [name] ([id number]) -------------------------------------------------------------------------------- Nutrition Risk Assessment Details Patient Name: Gabriela [name], [name] Date of Service: 07/12/2017 8:45 AM Medical Record Number: [id number] Patient Account Number: [id number] Date of Birth/Sex: 1925-10-09 (83 y.o. Female) Treating RN: Carolyne Fiscal, Debi Primary Care Mahad Newstrom: Cranford Mon, Delfino Lovett Other Clinician: Referring Dequavius Kuhner: Cranford Mon, RICHARD Treating Nhan Qualley/Extender: Tito Dine in Treatment: 0 Height (in): 63 Weight (lbs): 145 Body Mass Index (BMI): 25.7 Nutrition Risk Assessment Items NUTRITION RISK SCREEN: I have an illness or condition that made me change the kind and/or amount of 0 No food I eat I eat fewer than two meals per day  0 No I eat few fruits and vegetables, or milk products 0 No I have three or more drinks of beer, liquor or wine almost every day 0 No I have tooth or mouth problems that make it hard for me to eat 0 No I don't always have enough money to buy the food I need 0 No I eat alone most of the time 0 No I take three or more different prescribed or over-the-counter drugs a day 1 Yes Without wanting to, I have lost or gained 10 pounds in the last six months 0 No I am not always physically able to shop, cook and/or feed myself 0 No Nutrition Protocols Good Risk Protocol 0 No interventions needed Moderate Risk Protocol Electronic Signature(s) Signed: 07/13/2017 2:54:02 PM By: Alric Quan Entered By: Alric Quan on 07/12/2017 08:54:45

## 2017-07-14 NOTE — Progress Notes (Addendum)
Crespo, Gitel M. ([id number]) Visit Report for 07/12/2017 Chief Complaint Document Details Patient Name: Gabriela [name], [name] Date of Service: 07/12/2017 8:45 AM Medical Record Number: [id number] Patient Account Number: [id number] Date of Birth/Sex: 07/02/1926 (83 y.o. Female) Treating RN: Carolyne Fiscal, Debi Primary Care Provider: Cranford Mon, Delfino Lovett Other Clinician: Referring Provider: Cranford Mon, RICHARD Treating Provider/Extender: Tito Dine in Treatment: 0 Information Obtained from: Patient Chief Complaint Patients presents for treatment of an open diabetic ulcer To the right thigh which has been opened for about 3 months 07/12/17; patient reprocessed today for the same open area on her right thigh. Previously seen here in September 2018 Electronic Signature(s) Signed: 07/12/2017 5:42:06 PM By: Linton Ham MD Entered By: Linton Ham on 07/12/2017 09:32:49 [name], [name] ([id number]) -------------------------------------------------------------------------------- HPI Details Patient Name: [name] Date of Service: 07/12/2017 8:45 AM Medical Record Number: [id number] Patient Account Number: [id number] Date of Birth/Sex: [birth year] (83 y.o. Female) Treating RN: Carolyne Fiscal, Debi Primary Care Provider: Cranford Mon, Delfino Lovett Other Clinician: Referring Provider: Cranford Mon, RICHARD Treating Provider/Extender: Tito Dine in Treatment: 0 History of Present Illness Location: right anterior thigh Quality: Patient reports experiencing a dull pain to affected area(s). Severity: Patient states wound are getting worse. Duration: Patient has had the wound for > 3 months prior to seeking treatment at the wound center Timing: Pain in wound is Intermittent (comes and goes Context: The wound occurred when the patient had an abscess which was drained and then continued to be open Modifying Factors: Other treatment(s) tried include:local dressing by the  orthopedic surgeon Associated Signs and Symptoms: Patient reports having increase discharge. HPI Description: 83 year old female with right thigh abscess and ulceration of the right thigh noted by her orthopedic surgeon Dr. Kurtis Bushman. The patient has had right total hip arthroplasty with ostial lysis done recently in June 2017 and x-ray done postoperatively looked good. Patient was previously treated with Keflex and Septra. reviewing the orthopedic notes Dr. Sabra Heck had done a IandD of the abscess somewhere in June and at some stage had cauterized hyperbaric granulation tissue which continues to have drainage from the wound. 07/12/17; this is a patient who was seen one time by Dr. Con Memos on 13/04/2017. She did not return to our clinic. She was dressed at that point with [facility]. I think she has been followed by orthopedics A Dr Harlow Mares and Dr. Sabra Heck of orthopedics for this area since then. He has been using peroxide to the area. The history here is a bit difficult to follow. It sounds as though she had an abscess develop roughly 6 months ago. There was no trauma. Apparently she had an IandD done but I don't see any culture results. X-rays have been done that show her previous total hip replacement to be in good position. Her total hip replacement was done 20 years ago per the patient and she had not had any problems in the interim. She has not had any pain in the area. She has no systemic symptoms including fever or chills. She is a type II diabetic on oral agents and Levemir Electronic Signature(s) Signed: 07/12/2017 5:42:06 PM By: Linton Ham MD Entered By: Linton Ham on 07/12/2017 09:36:48 [name], [name] ([id number]) -------------------------------------------------------------------------------- Incision and Drainage Details Patient Name: [name], [name] Date of Service: 07/12/2017 8:45 AM Medical Record Number: [id number] Patient Account Number: [id number] Date of  Birth/Sex: [birth year] (83 y.o. Female) Treating RN: Carolyne Fiscal, Debi Primary Care Provider: Cranford Mon, Delfino Lovett Other Clinician: Referring Provider: Wilhemena Durie Treating  Provider/Extender: Ricard Dillon Weeks in Treatment: 0 Incision And Drainage Wound #2 Right Upper Leg Performed for: Performed By: Physician Ricard Dillon, MD Incision And Drainage Abscess Type: Location: right upper leg Pre-procedure Yes - 09:17 Verification/Time Out Taken: Pain Control: Lidocaine 4% Topical Solution Drainage Of: Sanguineous Instrument: Blade Bleeding: Moderate Hemostasis Achieved: Silver Nitrate Culture Sent: Swab Procedural Pain: 0 Post Procedural Pain: 0 Response to Treatment: Procedure was tolerated well Post Procedure Diagnosis Same as Pre-procedure Electronic Signature(s) Signed: 07/12/2017 5:42:06 PM By: Linton Ham MD Entered By: Linton Ham on 07/12/2017 09:32:16 [name], [name] ([id number]) -------------------------------------------------------------------------------- Physical Exam Details Patient Name: [name] Date of Service: 07/12/2017 8:45 AM Medical Record Number: [id number] Patient Account Number: [id number] Date of Birth/Sex: 12-25-1925 (83 y.o. Female) Treating RN: Carolyne Fiscal, Debi Primary Care Provider: Cranford Mon, Delfino Lovett Other Clinician: Referring Provider: Cranford Mon, RICHARD Treating Provider/Extender: Tito Dine in Treatment: 0 Constitutional Patient is hypertensive.. Pulse regular and within target range for patient.Marland Kitchen Respirations regular, non-labored and within target range.. Temperature is normal and within the target range for the patient.Marland Kitchen appears in no distress. Eyes Conjunctivae clear. No discharge. Respiratory Respiratory effort is easy and symmetric bilaterally. Rate is normal at rest and on room air.. Bilateral breath sounds are clear and equal in all lobes with no wheezes, rales or  rhonchi.. Cardiovascular Heart rhythm and rate regular, without murmur or gallop.. Gastrointestinal (GI) Abdomen is soft and non-distended without masses or tenderness. Bowel sounds active in all quadrants.. Genitourinary (GU) Bladder without fullness, masses or tenderness.. Lymphatic None palpable in the inguinal area. Musculoskeletal Right hip moves without pain. Integumentary (Hair, Skin) There is no systemic rash. Psychiatric Patient bright and interactive but somewhat of a vague historian. Notes Wound exam; area is on the right mid anterior thigh. Although this was epithelialized on arrival with pressure over the area there was clearly debris under the surface. Using a #15 curette I gently opened this area there was serosanguineous drainage which I cultured. Careful probing of this area does not reveal a sinus tract. There is still some firmness under this area but no clear erythema or tenderness. Nevertheless I was inserted about a more widespread area of mid meets the eye here. Electronic Signature(s) Signed: 07/12/2017 5:42:06 PM By: Linton Ham MD Entered By: Linton Ham on 07/12/2017 09:39:25 Blissett, [name] ([id number]) -------------------------------------------------------------------------------- Physician Orders Details Patient Name: TIFFNAY, BOSSI. Date of Service: 07/12/2017 8:45 AM Medical Record Number: [id number] Patient Account Number: [id number] Date of Birth/Sex: 1925/12/03 (83 y.o. Female) Treating RN: Carolyne Fiscal, Debi Primary Care Provider: Cranford Mon, Delfino Lovett Other Clinician: Referring Provider: Cranford Mon, RICHARD Treating Provider/Extender: Tito Dine in Treatment: 0 Verbal / Phone Orders: Yes Clinician: Pinkerton, Debi Read Back and Verified: Yes Diagnosis Coding Wound Cleansing Wound #2 Right Upper Leg o Clean wound with Normal Saline. Anesthetic Wound #2 Right Upper Leg o Topical Lidocaine 4% cream applied to wound  bed prior to debridement - clinic use Skin Barriers/Peri-Wound Care Wound #2 Right Upper Leg o Skin Prep Primary Wound Dressing Wound #2 Right Upper Leg o Silvercel Non-Adherent - rope (cut to fit and light pack into wound) leave a tail out for removal Secondary Dressing Wound #2 Right Upper Leg o Dry Gauze o Other - telfa island Dressing Change Frequency Wound #2 Right Upper Leg o Change dressing every day. Follow-up Appointments Wound #2 Right Upper Leg o Return Appointment in 1 week. Additional Orders / Instructions Wound #2 Right Upper Leg o Increase  protein intake. Home Health Wound #2 Right Upper Leg o Initiate Home Health for [place] Nurse may visit PRN to address patientos wound care needs. o FACE TO FACE ENCOUNTER: MEDICARE and MEDICAID PATIENTS: I certify that this patient is under my care and that I had a face-to-face encounter that meets the physician face-to-face encounter requirements with this patient on this date. The encounter with the patient was in whole or in part for the following MEDICAL CONDITION: (primary reason for [place]) MEDICAL NECESSITY: I certify, that based on my findings, Board[place], Margarie M. ([id number]) NURSING services are a medically necessary home health service. HOME BOUND STATUS: I certify that my clinical findings support that this patient is homebound (i.e., Due to illness or injury, pt requires aid of supportive devices such as crutches, cane, wheelchairs, walkers, the use of special transportation or the assistance of another person to leave their place of residence. There is a normal inability to leave the home and doing so requires considerable and taxing effort. Other absences are for medical reasons / religious services and are infrequent or of short duration when for other reasons). o If current dressing causes regression in wound condition, may D/C ordered dressing product/s and  apply Normal Saline Moist Dressing daily until next [place] / Other MD appointment. [place] of regression in wound condition at [phone]. o Please direct any NON-WOUND related issues/requests for orders to patient's Primary Care Physician Laboratory o Bacteria identified in Wound by Culture (MICRO) oooo LOINC Code: [id number] oooo Convenience Name: Wound culture routine Electronic Signature(s) Signed: 07/12/2017 5:42:06 PM By: Linton Ham MD Signed: 07/13/2017 2:54:02 PM By: Alric Quan Entered By: Alric Quan on 07/12/2017 11:18:04 Sebree, [name] ([id number]) -------------------------------------------------------------------------------- Problem List Details Patient Name: MELI, FALEY. Date of Service: 07/12/2017 8:45 AM Medical Record Number: [id number] Patient Account Number: [id number] Date of Birth/Sex: 1926/08/06 (83 y.o. 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 ([id number]) -------------------------------------------------------------------------------- Progress Note Details Patient Name: ENIOLA, CERULLO. Date of Service: 07/12/2017 8:45 AM Medical Record Number: [id number] Patient Account Number: [id number] Date of Birth/Sex: 1925/09/12 (83 y.o. Female) Treating RN: Carolyne Fiscal, Debi Primary Care Provider: Cranford Mon, Delfino Lovett Other  Clinician: Referring Provider: Cranford Mon, RICHARD Treating Provider/Extender: Tito Dine in Treatment: 0 Subjective Chief Complaint Information obtained from Patient Patients presents for treatment of an open diabetic ulcer To the right thigh which has been opened for about 3 months 07/12/17; patient reprocessed today for the same open area on her right thigh. Previously seen here in September 2018 History of Present Illness (HPI) The following HPI elements were documented for the patient's wound: Location: right anterior thigh Quality: Patient reports experiencing a dull pain to affected area(s). Severity: Patient states wound are getting worse. Duration: Patient has had the wound for > 3 months prior to seeking treatment at the wound center Timing: Pain in wound is Intermittent (comes and goes Context: The wound occurred when the patient had an abscess which was drained and then continued to be open  Modifying Factors: Other treatment(s) tried include:local dressing by the orthopedic surgeon Associated Signs and Symptoms: Patient reports having increase discharge. 83 year old female with right thigh abscess and ulceration of the right thigh noted by her orthopedic surgeon Dr. Kurtis Bushman. The patient has had right total hip arthroplasty with ostial lysis done recently in June 2017 and x-ray done postoperatively looked good. Patient was previously treated with Keflex and Septra. reviewing the orthopedic notes Dr. Sabra Heck had done a IandD of the abscess somewhere in June and at some stage had cauterized hyperbaric granulation tissue which continues to have drainage from the wound. 07/12/17; this is a patient who was seen one time by Dr. Con Memos on 13/04/2017. She did not return to our clinic. She was dressed at that point with [facility] I think she has been followed by orthopedics A Dr Harlow Mares and Dr. Sabra Heck of orthopedics for this area since then. He has been using  peroxide to the area. The history here is a bit difficult to follow. It sounds as though she had an abscess develop roughly 6 months ago. There was no trauma. Apparently she had an IandD done but I don't see any culture results. X-rays have been done that show her previous total hip replacement to be in good position. Her total hip replacement was done 20 years ago per the patient and she had not had any problems in the interim. She has not had any pain in the area. She has no systemic symptoms including fever or chills. She is a type II diabetic on oral agents and Levemir Wound History Patient presents with 1 open wound that has been present for approximately 3 months. Patient has been treating wound in the following manner: peroxide. The wound has been healed in the past but has re-opened. Laboratory tests have not been performed in the last month. Patient reportedly has not tested positive for an antibiotic resistant organism. Patient reportedly has tested positive for osteomyelitis. Patient reportedly has not had testing performed to evaluate circulation in the legs. Patient History Information obtained from Patient. Allergies No Known Drug Allergies Family History Demasi, Sheniya M. ([id number]) Cancer - Child, Diabetes - Child, Heart Disease - Child, No family history of Hypertension, Kidney Disease, Lung Disease, Seizures, Stroke, Thyroid Problems, Tuberculosis. Social History Never smoker, Marital Status - Widowed, Alcohol Use - Never, Drug Use - No History, Caffeine Use - Daily. Medical And Surgical History Notes Constitutional Symptoms (General Health) High Blood Pressure, Diabetes, Aspirin for heart, fluid pil Cardiovascular Heart Bypass Review of Systems (ROS) Constitutional Symptoms (General Health) The patient has no complaints or symptoms. Objective Constitutional Patient is hypertensive.. Pulse regular and within target range for patient.Marland Kitchen Respirations regular,  non-labored and within target range.. Temperature is normal and within the target range for the patient.Marland Kitchen appears in no distress. Vitals Time Taken: 8:48 AM, Height: 63 in, Source: Stated, Weight: 145 lbs, Source: Measured, BMI: 25.7, Temperature: 98.0 F, Pulse: 68 bpm, Respiratory Rate: 16 breaths/min, Blood Pressure: 160/55 mmHg. Eyes Conjunctivae clear. No discharge. Respiratory Respiratory effort is easy and symmetric bilaterally. Rate is normal at rest and on room air.. Bilateral breath sounds are clear and equal in all lobes with no wheezes, rales or rhonchi.. Cardiovascular Heart rhythm and rate regular, without murmur or gallop.. Gastrointestinal (GI) Abdomen is soft and non-distended without masses or tenderness. Bowel sounds active in all quadrants.. Genitourinary (GU) Bladder without fullness, masses or tenderness.. Lymphatic None palpable in the inguinal area. Musculoskeletal Right hip moves without pain. Psychiatric  Patient bright and interactive but somewhat of a vague historian. Matlock, Charmane M. ([id number]) General Notes: Wound exam; area is on the right mid anterior thigh. Although this was epithelialized on arrival with pressure over the area there was clearly debris under the surface. Using a #15 curette I gently opened this area there was serosanguineous drainage which I cultured. Careful probing of this area does not reveal a sinus tract. There is still some firmness under this area but no clear erythema or tenderness. Nevertheless I was inserted about a more widespread area of mid meets the eye here. Integumentary (Hair, Skin) There is no systemic rash. Wound #2 status is Open. Original cause of wound was Gradually Appeared. The wound is located on the Right Upper Leg. The wound measures 1cm length x 0.8cm width x 0.1cm depth; 0.628cm^2 area and 0.063cm^3 volume. There is no tunneling or undermining noted. There is a large amount of serous drainage noted. The  wound margin is distinct with the outline attached to the wound base. There is no granulation within the wound bed. There is a small (1-33%) amount of necrotic tissue within the wound bed including Adherent Slough. The periwound skin appearance exhibited: Maceration. Periwound temperature was noted as No Abnormality. The periwound has tenderness on palpation. Assessment Active Problems ICD-10 E11.622 - Type 2 diabetes mellitus with other skin ulcer L97.112 - Non-pressure chronic ulcer of right thigh with fat layer exposed Procedures Wound #2 Pre-procedure diagnosis of Wound #2 is a Diabetic Wound/Ulcer of the Lower Extremity located on the Right Upper Leg . Abscess incision and drainage was provided by Ricard Dillon, MD. The skin was cleansed and prepped with anti- septic followed by pain control using Lidocaine 4% Topical Solution. An incision was made in the right upper leg with the following instrument(s): Blade. There was an immediate release of Sanguineous fluid. A Moderate amount of bleeding was controlled with Silver Nitrate. A time out was conducted at 09:17, prior to the start of the procedure. Swab culture was sent. The procedure was tolerated well with a pain level of 0 throughout and a pain level of 0 following the procedure. Post procedure Diagnosis Wound #2: Same as Pre-Procedure Plan Wound Cleansing: Wound #2 Right Upper Leg: Clean wound with Normal Saline. Anesthetic: Wound #2 Right Upper Leg: Topical Lidocaine 4% cream applied to wound bed prior to debridement - clinic use Skin Barriers/Peri-Wound Care: Wound #2 Right Upper Leg: Gierke, Alesa M. ([id number]) Skin Prep Primary Wound Dressing: Wound #2 Right Upper Leg: Silvercel Non-Adherent - rope (cut to fit and light pack into wound) leave a tail out for removal Secondary Dressing: Wound #2 Right Upper Leg: Dry Gauze Other - telfa island Dressing Change Frequency: Wound #2 Right Upper Leg: Change dressing  every day. Follow-up Appointments: Wound #2 Right Upper Leg: Return Appointment in 1 week. Additional Orders / Instructions: Wound #2 Right Upper Leg: Increase protein intake. Home Health: Wound #2 Right Upper Leg: [place] for [place] Nurse may visit PRN to address patient s wound care needs. FACE TO FACE ENCOUNTER: MEDICARE and MEDICAID PATIENTS: I certify that this patient is under my care and that I had a face-to-face encounter that meets the physician face-to-face encounter requirements with this patient on this date. The encounter with the patient was in whole or in part for the following MEDICAL CONDITION: (primary reason for [place]) MEDICAL NECESSITY: I certify, that based on my findings, NURSING services are a medically necessary home health service.  HOME BOUND STATUS: I certify that my clinical findings support that this patient is homebound (i.e., Due to illness or injury, pt requires aid of supportive devices such as crutches, cane, wheelchairs, walkers, the use of special transportation or the assistance of another person to leave their place of residence. There is a normal inability to leave the home and doing so requires considerable and taxing effort. Other absences are for medical reasons / religious services and are infrequent or of short duration when for other reasons). If current dressing causes regression in wound condition, may D/C ordered dressing product/s and apply Normal Saline Moist Dressing daily until next [place] / Other MD appointment. [place] of regression in wound condition at [phone]. Please direct any NON-WOUND related issues/requests for orders to patient's Primary Care Physician Laboratory ordered were: Wound culture routine #1 patient re-present store clinic with the same issue as when she came in September. #2 culture done of this area after incision of the surface  material with a #15 blade #3 I'm going to dress this with silver alginate and border foam that the patient will change herself #4 there is a firm area under this wound which is somewhat tender to the patient. I wonder about a deeper problem than meets the eye ear and perhaps this is a drainage site or deeper underlying problem. She has had plain x-rays done at the orthopedic clinic that did not show an obvious problem with the hip replacement. There was lucency behind the acetabular shell consistent with polyethylene debris. The stem of the prosthesis didn't appear particularly remarkable. I would wonder about a more detailed study of this fails to close i.e. an ultrasound or CT scan Electronic Signature(s) Signed: 08/04/2017 10:25:24 AM By: Gretta Cool, BSN, RN, CWS, Kim RN, BSN Signed: 08/06/2017 7:29:46 AM By: Linton Ham MD [name], [name] ([id number]) Previous Signature: 07/12/2017 5:42:06 PM Version By: Linton Ham MD Entered By: Gretta Cool, BSN, RN, CWS, Kim on 08/04/2017 10:25:24 Erin Sons. ([id number]) -------------------------------------------------------------------------------- ROS/PFSH Details Patient Name: AMARISS, DETAMORE. Date of Service: 07/12/2017 8:45 AM Medical Record Number: [id number] Patient Account Number: [id number] Date of Birth/Sex: 06-Dec-1925 (83 y.o. Female) Treating RN: Carolyne Fiscal, Debi Primary Care Provider: Cranford Mon, Delfino Lovett Other Clinician: Referring Provider: Cranford Mon, RICHARD Treating Provider/Extender: Tito Dine in Treatment: 0 Information Obtained From Patient Wound History Do you currently have one or more open woundso Yes How many open wounds do you currently haveo 1 Approximately how long have you had your woundso 3 months How have you been treating your wound(s) until nowo peroxide Has your wound(s) ever healed and then re-openedo Yes Have you had any lab work done in the past montho No Have you tested positive for an  antibiotic resistant organism (MRSA, VRE)o No Have you tested positive for osteomyelitis (bone infection)o Yes Have you had any tests for circulation on your legso No Constitutional Symptoms (General Health) Complaints and Symptoms: No Complaints or Symptoms Medical History: Past Medical History Notes: High Blood Pressure, Diabetes, Aspirin for heart, fluid pil Eyes Medical History: Positive for: Cataracts - removed; Glaucoma Negative for: Optic Neuritis Ear/Nose/Mouth/Throat Medical History: Negative for: Chronic sinus problems/congestion; Middle ear problems Hematologic/Lymphatic Medical History: Positive for: Anemia Negative for: Hemophilia; Human Immunodeficiency Virus; Lymphedema; Sickle Cell Disease Respiratory Medical History: Negative for: Aspiration; Asthma; Chronic Obstructive Pulmonary Disease (COPD); Pneumothorax; Sleep Apnea; Tuberculosis Cardiovascular Medical History: Positive for: Hypertension Negative for: Angina; Arrhythmia; Congestive Heart Failure; Coronary Artery Disease; Deep Vein Thrombosis; Hypotension;  Eckford, Zikeria M. ([id number]) Myocardial Infarction; Peripheral Arterial Disease; Peripheral Venous Disease; Phlebitis; Vasculitis Past Medical History Notes: Heart Bypass Gastrointestinal Medical History: Negative for: Cirrhosis ; Colitis; Crohnos; Hepatitis A; Hepatitis C Endocrine Medical History: Positive for: Type II Diabetes Negative for: Type I Diabetes Time with diabetes: 20 Treated with: Insulin Blood sugar tested every day: No Genitourinary Medical History: Negative for: End Stage Renal Disease Immunological Medical History: Negative for: Lupus Erythematosus; Raynaudos; Scleroderma Integumentary (Skin) Medical History: Negative for: History of Burn; History of pressure wounds Musculoskeletal Medical History: Positive for: Osteoarthritis Negative for: Gout; Rheumatoid Arthritis; Osteomyelitis Neurologic Medical  History: Negative for: Dementia; Neuropathy; Quadriplegia; Paraplegia; Seizure Disorder Oncologic Medical History: Negative for: Received Chemotherapy; Received Radiation Psychiatric Medical History: Negative for: Anorexia/bulimia; Confinement Anxiety HBO Extended History Items Eyes: Eyes: Cataracts Glaucoma Farner, Shital M. ([id number]) Immunizations Pneumococcal Vaccine: Received Pneumococcal Vaccination: Yes Implantable Devices Family and Social History Cancer: Yes - Child; Diabetes: Yes - Child; Heart Disease: Yes - Child; Hypertension: No; Kidney Disease: No; Lung Disease: No; Seizures: No; Stroke: No; Thyroid Problems: No; Tuberculosis: No; Never smoker; Marital Status - Widowed; Alcohol Use: Never; Drug Use: No History; Caffeine Use: Daily; Advanced Directives: No; Patient does not want information on Advanced Directives; Living Will: No; Medical Power of Attorney: No Electronic Signature(s) Signed: 07/12/2017 5:42:06 PM By: Linton Ham MD Signed: 07/13/2017 2:54:02 PM By: Alric Quan Entered By: Alric Quan on 07/12/2017 08:53:07 Decola, Aishi M. ([id number]) -------------------------------------------------------------------------------- SuperBill Details Patient Name: KATESSA, ATTRIDGE. Date of Service: 07/12/2017 Medical Record Number: [id number] Patient Account Number: [id number] Date of Birth/Sex: 1925/10/07 (83 y.o. 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 Facility Procedures CPT4 Code: [id number] Description: [phone] - WOUND CARE VISIT-LEV 2 EST PT Modifier: Quantity: 1 CPT4 Code: [id number] Description: 10060 - I  and D Abscess; simple/single ICD-10 Diagnosis Description K59.977 Non-pressure chronic ulcer of right thigh with fat layer exp E11.622 Type 2 diabetes mellitus with other skin ulcer Modifier: osed Quantity: 1 Physician Procedures CPT4 Code: [id number] Description: 99214 - WC PHYS LEVEL 4 - EST PT ICD-10 Diagnosis Description E11.622 Type 2 diabetes mellitus with other skin ulcer L97.112 Non-pressure chronic ulcer of right thigh with fat layer e Modifier: xposed Quantity: 1 CPT4 Code: 3202334 Description: 10060 - I and D Abscess; simple/single ICD-10 Diagnosis Description D56.861 Non-pressure chronic ulcer of right thigh with fat layer e E11.622 Type 2 diabetes mellitus with other skin ulcer Modifier: xposed Quantity: 1 Electronic Signature(s) Signed: 07/17/2017 10:45:45 AM By: Gretta Cool, BSN, RN, CWS, Kim RN, BSN Signed: 07/25/2017 2:49:40 PM By: Linton Ham MD Previous Signature: 07/12/2017 11:15:23 AM Version By: Alric Quan Previous Signature: 07/12/2017 5:42:06 PM Version By: Linton Ham MD Previous Signature: 07/12/2017 11:14:50 AM Version By: Alric Quan Entered By: Gretta Cool BSN, RN, CWS, Kim on 07/17/2017 10:45:44

## 2017-07-15 LAB — AEROBIC CULTURE  (SUPERFICIAL SPECIMEN): CULTURE: NO GROWTH

## 2017-07-15 LAB — AEROBIC CULTURE W GRAM STAIN (SUPERFICIAL SPECIMEN)

## 2017-07-16 ENCOUNTER — Other Ambulatory Visit: Payer: Self-pay | Admitting: Family Medicine

## 2017-07-17 DIAGNOSIS — H40153 Residual stage of open-angle glaucoma, bilateral: Secondary | ICD-10-CM | POA: Diagnosis not present

## 2017-07-18 DIAGNOSIS — Z7982 Long term (current) use of aspirin: Secondary | ICD-10-CM | POA: Diagnosis not present

## 2017-07-18 DIAGNOSIS — E11622 Type 2 diabetes mellitus with other skin ulcer: Secondary | ICD-10-CM | POA: Diagnosis not present

## 2017-07-18 DIAGNOSIS — L97112 Non-pressure chronic ulcer of right thigh with fat layer exposed: Secondary | ICD-10-CM | POA: Diagnosis not present

## 2017-07-18 DIAGNOSIS — I10 Essential (primary) hypertension: Secondary | ICD-10-CM | POA: Diagnosis not present

## 2017-07-18 DIAGNOSIS — Z96641 Presence of right artificial hip joint: Secondary | ICD-10-CM | POA: Diagnosis not present

## 2017-07-18 DIAGNOSIS — Z791 Long term (current) use of non-steroidal anti-inflammatories (NSAID): Secondary | ICD-10-CM | POA: Diagnosis not present

## 2017-07-18 DIAGNOSIS — Z794 Long term (current) use of insulin: Secondary | ICD-10-CM | POA: Diagnosis not present

## 2017-07-19 ENCOUNTER — Ambulatory Visit: Payer: Medicare Other | Admitting: Internal Medicine

## 2017-07-20 ENCOUNTER — Other Ambulatory Visit: Payer: Self-pay | Admitting: Family Medicine

## 2017-07-20 DIAGNOSIS — I10 Essential (primary) hypertension: Secondary | ICD-10-CM

## 2017-07-21 DIAGNOSIS — I10 Essential (primary) hypertension: Secondary | ICD-10-CM | POA: Diagnosis not present

## 2017-07-21 DIAGNOSIS — L97112 Non-pressure chronic ulcer of right thigh with fat layer exposed: Secondary | ICD-10-CM | POA: Diagnosis not present

## 2017-07-21 DIAGNOSIS — E11622 Type 2 diabetes mellitus with other skin ulcer: Secondary | ICD-10-CM | POA: Diagnosis not present

## 2017-07-21 DIAGNOSIS — Z794 Long term (current) use of insulin: Secondary | ICD-10-CM | POA: Diagnosis not present

## 2017-07-21 DIAGNOSIS — Z96641 Presence of right artificial hip joint: Secondary | ICD-10-CM | POA: Diagnosis not present

## 2017-07-21 DIAGNOSIS — Z791 Long term (current) use of non-steroidal anti-inflammatories (NSAID): Secondary | ICD-10-CM | POA: Diagnosis not present

## 2017-07-21 DIAGNOSIS — Z7982 Long term (current) use of aspirin: Secondary | ICD-10-CM | POA: Diagnosis not present

## 2017-07-26 ENCOUNTER — Ambulatory Visit: Payer: Medicare Other | Admitting: Internal Medicine

## 2017-07-26 DIAGNOSIS — E11622 Type 2 diabetes mellitus with other skin ulcer: Secondary | ICD-10-CM | POA: Diagnosis not present

## 2017-07-26 DIAGNOSIS — Z791 Long term (current) use of non-steroidal anti-inflammatories (NSAID): Secondary | ICD-10-CM | POA: Diagnosis not present

## 2017-07-26 DIAGNOSIS — Z96641 Presence of right artificial hip joint: Secondary | ICD-10-CM | POA: Diagnosis not present

## 2017-07-26 DIAGNOSIS — L97112 Non-pressure chronic ulcer of right thigh with fat layer exposed: Secondary | ICD-10-CM | POA: Diagnosis not present

## 2017-07-26 DIAGNOSIS — I10 Essential (primary) hypertension: Secondary | ICD-10-CM | POA: Diagnosis not present

## 2017-07-26 DIAGNOSIS — Z794 Long term (current) use of insulin: Secondary | ICD-10-CM | POA: Diagnosis not present

## 2017-07-26 DIAGNOSIS — Z7982 Long term (current) use of aspirin: Secondary | ICD-10-CM | POA: Diagnosis not present

## 2017-07-28 DIAGNOSIS — Z794 Long term (current) use of insulin: Secondary | ICD-10-CM | POA: Diagnosis not present

## 2017-07-28 DIAGNOSIS — Z791 Long term (current) use of non-steroidal anti-inflammatories (NSAID): Secondary | ICD-10-CM | POA: Diagnosis not present

## 2017-07-28 DIAGNOSIS — E11622 Type 2 diabetes mellitus with other skin ulcer: Secondary | ICD-10-CM | POA: Diagnosis not present

## 2017-07-28 DIAGNOSIS — Z96641 Presence of right artificial hip joint: Secondary | ICD-10-CM | POA: Diagnosis not present

## 2017-07-28 DIAGNOSIS — L97112 Non-pressure chronic ulcer of right thigh with fat layer exposed: Secondary | ICD-10-CM | POA: Diagnosis not present

## 2017-07-28 DIAGNOSIS — Z7982 Long term (current) use of aspirin: Secondary | ICD-10-CM | POA: Diagnosis not present

## 2017-07-28 DIAGNOSIS — I10 Essential (primary) hypertension: Secondary | ICD-10-CM | POA: Diagnosis not present

## 2017-07-30 ENCOUNTER — Other Ambulatory Visit: Payer: Self-pay | Admitting: Family Medicine

## 2017-07-31 DIAGNOSIS — Z7982 Long term (current) use of aspirin: Secondary | ICD-10-CM | POA: Diagnosis not present

## 2017-07-31 DIAGNOSIS — E11622 Type 2 diabetes mellitus with other skin ulcer: Secondary | ICD-10-CM | POA: Diagnosis not present

## 2017-07-31 DIAGNOSIS — Z96641 Presence of right artificial hip joint: Secondary | ICD-10-CM | POA: Diagnosis not present

## 2017-07-31 DIAGNOSIS — L97112 Non-pressure chronic ulcer of right thigh with fat layer exposed: Secondary | ICD-10-CM | POA: Diagnosis not present

## 2017-07-31 DIAGNOSIS — Z791 Long term (current) use of non-steroidal anti-inflammatories (NSAID): Secondary | ICD-10-CM | POA: Diagnosis not present

## 2017-07-31 DIAGNOSIS — Z794 Long term (current) use of insulin: Secondary | ICD-10-CM | POA: Diagnosis not present

## 2017-07-31 DIAGNOSIS — I10 Essential (primary) hypertension: Secondary | ICD-10-CM | POA: Diagnosis not present

## 2017-08-02 ENCOUNTER — Ambulatory Visit: Payer: Medicare Other | Admitting: Internal Medicine

## 2017-08-04 DIAGNOSIS — L97112 Non-pressure chronic ulcer of right thigh with fat layer exposed: Secondary | ICD-10-CM | POA: Diagnosis not present

## 2017-08-04 DIAGNOSIS — I10 Essential (primary) hypertension: Secondary | ICD-10-CM | POA: Diagnosis not present

## 2017-08-04 DIAGNOSIS — Z791 Long term (current) use of non-steroidal anti-inflammatories (NSAID): Secondary | ICD-10-CM | POA: Diagnosis not present

## 2017-08-04 DIAGNOSIS — Z794 Long term (current) use of insulin: Secondary | ICD-10-CM | POA: Diagnosis not present

## 2017-08-04 DIAGNOSIS — E11622 Type 2 diabetes mellitus with other skin ulcer: Secondary | ICD-10-CM | POA: Diagnosis not present

## 2017-08-04 DIAGNOSIS — Z96641 Presence of right artificial hip joint: Secondary | ICD-10-CM | POA: Diagnosis not present

## 2017-08-04 DIAGNOSIS — Z7982 Long term (current) use of aspirin: Secondary | ICD-10-CM | POA: Diagnosis not present

## 2017-08-10 DIAGNOSIS — E11622 Type 2 diabetes mellitus with other skin ulcer: Secondary | ICD-10-CM | POA: Diagnosis not present

## 2017-08-10 DIAGNOSIS — Z791 Long term (current) use of non-steroidal anti-inflammatories (NSAID): Secondary | ICD-10-CM | POA: Diagnosis not present

## 2017-08-10 DIAGNOSIS — I10 Essential (primary) hypertension: Secondary | ICD-10-CM | POA: Diagnosis not present

## 2017-08-10 DIAGNOSIS — Z794 Long term (current) use of insulin: Secondary | ICD-10-CM | POA: Diagnosis not present

## 2017-08-10 DIAGNOSIS — Z7982 Long term (current) use of aspirin: Secondary | ICD-10-CM | POA: Diagnosis not present

## 2017-08-10 DIAGNOSIS — L97112 Non-pressure chronic ulcer of right thigh with fat layer exposed: Secondary | ICD-10-CM | POA: Diagnosis not present

## 2017-08-10 DIAGNOSIS — Z96641 Presence of right artificial hip joint: Secondary | ICD-10-CM | POA: Diagnosis not present

## 2017-08-14 DIAGNOSIS — E11622 Type 2 diabetes mellitus with other skin ulcer: Secondary | ICD-10-CM | POA: Diagnosis not present

## 2017-08-17 DIAGNOSIS — E11622 Type 2 diabetes mellitus with other skin ulcer: Secondary | ICD-10-CM | POA: Diagnosis not present

## 2017-08-17 DIAGNOSIS — Z96641 Presence of right artificial hip joint: Secondary | ICD-10-CM | POA: Diagnosis not present

## 2017-08-17 DIAGNOSIS — Z791 Long term (current) use of non-steroidal anti-inflammatories (NSAID): Secondary | ICD-10-CM | POA: Diagnosis not present

## 2017-08-17 DIAGNOSIS — I10 Essential (primary) hypertension: Secondary | ICD-10-CM | POA: Diagnosis not present

## 2017-08-17 DIAGNOSIS — L97112 Non-pressure chronic ulcer of right thigh with fat layer exposed: Secondary | ICD-10-CM | POA: Diagnosis not present

## 2017-08-17 DIAGNOSIS — Z794 Long term (current) use of insulin: Secondary | ICD-10-CM | POA: Diagnosis not present

## 2017-08-17 DIAGNOSIS — Z7982 Long term (current) use of aspirin: Secondary | ICD-10-CM | POA: Diagnosis not present

## 2017-08-24 DIAGNOSIS — Z794 Long term (current) use of insulin: Secondary | ICD-10-CM | POA: Diagnosis not present

## 2017-08-24 DIAGNOSIS — Z791 Long term (current) use of non-steroidal anti-inflammatories (NSAID): Secondary | ICD-10-CM | POA: Diagnosis not present

## 2017-08-24 DIAGNOSIS — Z7982 Long term (current) use of aspirin: Secondary | ICD-10-CM | POA: Diagnosis not present

## 2017-08-24 DIAGNOSIS — I10 Essential (primary) hypertension: Secondary | ICD-10-CM | POA: Diagnosis not present

## 2017-08-24 DIAGNOSIS — Z96641 Presence of right artificial hip joint: Secondary | ICD-10-CM | POA: Diagnosis not present

## 2017-08-24 DIAGNOSIS — L97112 Non-pressure chronic ulcer of right thigh with fat layer exposed: Secondary | ICD-10-CM | POA: Diagnosis not present

## 2017-08-24 DIAGNOSIS — E11622 Type 2 diabetes mellitus with other skin ulcer: Secondary | ICD-10-CM | POA: Diagnosis not present

## 2017-08-30 DIAGNOSIS — I10 Essential (primary) hypertension: Secondary | ICD-10-CM | POA: Diagnosis not present

## 2017-08-30 DIAGNOSIS — Z7982 Long term (current) use of aspirin: Secondary | ICD-10-CM | POA: Diagnosis not present

## 2017-08-30 DIAGNOSIS — Z794 Long term (current) use of insulin: Secondary | ICD-10-CM | POA: Diagnosis not present

## 2017-08-30 DIAGNOSIS — Z791 Long term (current) use of non-steroidal anti-inflammatories (NSAID): Secondary | ICD-10-CM | POA: Diagnosis not present

## 2017-08-30 DIAGNOSIS — E11622 Type 2 diabetes mellitus with other skin ulcer: Secondary | ICD-10-CM | POA: Diagnosis not present

## 2017-08-30 DIAGNOSIS — Z96641 Presence of right artificial hip joint: Secondary | ICD-10-CM | POA: Diagnosis not present

## 2017-08-30 DIAGNOSIS — L97112 Non-pressure chronic ulcer of right thigh with fat layer exposed: Secondary | ICD-10-CM | POA: Diagnosis not present

## 2017-09-05 ENCOUNTER — Encounter: Payer: Medicare Other | Attending: Physician Assistant | Admitting: Physician Assistant

## 2017-09-05 DIAGNOSIS — L97112 Non-pressure chronic ulcer of right thigh with fat layer exposed: Secondary | ICD-10-CM | POA: Diagnosis not present

## 2017-09-05 DIAGNOSIS — Z951 Presence of aortocoronary bypass graft: Secondary | ICD-10-CM | POA: Insufficient documentation

## 2017-09-05 DIAGNOSIS — I10 Essential (primary) hypertension: Secondary | ICD-10-CM | POA: Insufficient documentation

## 2017-09-05 DIAGNOSIS — E11622 Type 2 diabetes mellitus with other skin ulcer: Secondary | ICD-10-CM | POA: Insufficient documentation

## 2017-09-05 DIAGNOSIS — M1990 Unspecified osteoarthritis, unspecified site: Secondary | ICD-10-CM | POA: Insufficient documentation

## 2017-09-05 DIAGNOSIS — H409 Unspecified glaucoma: Secondary | ICD-10-CM | POA: Insufficient documentation

## 2017-09-05 DIAGNOSIS — E1121 Type 2 diabetes mellitus with diabetic nephropathy: Secondary | ICD-10-CM | POA: Diagnosis not present

## 2017-09-05 DIAGNOSIS — Z794 Long term (current) use of insulin: Secondary | ICD-10-CM | POA: Diagnosis not present

## 2017-09-06 DIAGNOSIS — Z794 Long term (current) use of insulin: Secondary | ICD-10-CM | POA: Diagnosis not present

## 2017-09-06 DIAGNOSIS — Z7982 Long term (current) use of aspirin: Secondary | ICD-10-CM | POA: Diagnosis not present

## 2017-09-06 DIAGNOSIS — I10 Essential (primary) hypertension: Secondary | ICD-10-CM | POA: Diagnosis not present

## 2017-09-06 DIAGNOSIS — E11622 Type 2 diabetes mellitus with other skin ulcer: Secondary | ICD-10-CM | POA: Diagnosis not present

## 2017-09-06 DIAGNOSIS — Z96641 Presence of right artificial hip joint: Secondary | ICD-10-CM | POA: Diagnosis not present

## 2017-09-06 DIAGNOSIS — L97112 Non-pressure chronic ulcer of right thigh with fat layer exposed: Secondary | ICD-10-CM | POA: Diagnosis not present

## 2017-09-06 DIAGNOSIS — Z791 Long term (current) use of non-steroidal anti-inflammatories (NSAID): Secondary | ICD-10-CM | POA: Diagnosis not present

## 2017-09-06 NOTE — Progress Notes (Signed)
[name], [name] ([id number]) Visit Report for 09/05/2017 Chief Complaint Document Details Patient Name: Gabriela [name], [name] Date of Service: 09/05/2017 8:00 AM Medical Record Number: [id number] Patient Account Number: [id number] Date of Birth/Sex: 1925-10-19 (84 y.o. Female) Treating RN: Montey Hora Primary Care Provider: Cranford Mon, Delfino Lovett Other Clinician: Referring Provider: Cranford Mon, Delfino Lovett Treating Provider/Extender: Melburn Hake, HOYT Weeks in Treatment: 0 Information Obtained from: Patient Chief Complaint Patients presents for treatment of an open diabetic ulcer To the right thigh which has been opened for about 3 months 07/12/17; patient reprocessed today for the same open area on her right thigh. Previously seen here in September 2018 09/05/17 Re-evaluation of the right thigh ulcer Electronic Signature(s) Signed: 09/06/2017 7:56:11 AM By: Worthy Keeler PA-C Entered By: Worthy Keeler on 09/05/2017 10:57:40 [name], [name] ([id number]) -------------------------------------------------------------------------------- Debridement Details Patient Name: Gabriela [name], [name] Date of Service: 09/05/2017 8:00 AM Medical Record Number: [id number] Patient Account Number: [id number] Date of Birth/Sex: 1925-09-01 (84 y.o. Female) Treating RN: Montey Hora Primary Care Provider: Cranford Mon, Delfino Lovett Other Clinician: Referring Provider: Wilhemena Durie Treating Provider/Extender: Melburn Hake, HOYT Weeks in Treatment: 0 Debridement Performed for Wound #3 Right,Anterior Upper Leg Assessment: Performed By: Physician STONE III, HOYT E., PA-C Debridement: Debridement Severity of Tissue Pre Fat layer exposed Debridement: Pre-procedure Verification/Time Yes - 09:08 Out Taken: Start Time: 09:08 Pain Control: Lidocaine Injectable : 2 Level: Skin/Subcutaneous Tissue Total Area Debrided (L x W): 0.7 (cm) x 1 (cm) = 0.7 (cm) Tissue and other material Viable, Non-Viable,  Fibrin/Slough, Skin, Subcutaneous debrided: Instrument: Curette, Forceps, Scissors Bleeding: Moderate Hemostasis Achieved: Pressure End Time: 09:15 Procedural Pain: 0 Post Procedural Pain: 0 Response to Treatment: Procedure was tolerated well Post Debridement Measurements of Total Wound Length: (cm) 0.9 Width: (cm) 1.1 Depth: (cm) 2.4 Volume: (cm) 1.866 Character of Wound/Ulcer Post Debridement: Improved Severity of Tissue Post Debridement: Fat layer exposed Post Procedure Diagnosis Same as Pre-procedure Electronic Signature(s) Signed: 09/05/2017 4:50:14 PM By: Montey Hora Signed: 09/06/2017 7:56:11 AM By: Worthy Keeler PA-C Entered By: Montey Hora on 09/05/2017 09:17:25 [name], [name] ([id number]) -------------------------------------------------------------------------------- HPI Details Patient Name: Gabriela [name], [name] Date of Service: 09/05/2017 8:00 AM Medical Record Number: [id number] Patient Account Number: [id number] Date of Birth/Sex: 21-Sep-1925 (84 y.o. Female) Treating RN: Montey Hora Primary Care Provider: Cranford Mon, Delfino Lovett Other Clinician: Referring Provider: Wilhemena Durie Treating Provider/Extender: Melburn Hake, HOYT Weeks in Treatment: 0 History of Present Illness HPI Description: 85 year old female with right thigh abscess and ulceration of the right thigh noted by her orthopedic surgeon Dr. Kurtis Bushman. The patient has had right total hip arthroplasty with ostial lysis done recently in June 2017 and x-ray done postoperatively looked good. Patient was previously treated with Keflex and Septra. reviewing the orthopedic notes Dr. Sabra Heck had done a IandD of the abscess somewhere in June and at some stage had cauterized hyperbaric granulation tissue which continues to have drainage from the wound. 07/12/17; this is a patient who was seen one time by Dr. Con Memos on 13/04/2017. She did not return to our clinic. She was dressed at that point with  [facility]. I think she has been followed by orthopedics A Dr Harlow Mares and Dr. Sabra Heck of orthopedics for this area since then. He has been using peroxide to the area. The history here is a bit difficult to follow. It sounds as though she had an abscess develop roughly 6 months ago. There was no trauma. Apparently she had an IandD done but I don't see  Gabriela culture results. X-rays have been done that show her previous total hip replacement to be in good position. Her total hip replacement was done 20 years ago per the patient and she had not had Gabriela problems in the interim. She has not had Gabriela pain in the area. She has no systemic symptoms including fever or chills. She is a type II diabetic on oral agents and Levemir 09/05/17 patient presents concerning her right thigh separation which has for the most part on inspection today closed over with epithelium although there is a small problematic for her. Nonetheless when she last saw Dr. Dellia Nims on July 12, 2017 he didn't do an incision and drainage and happened to mention that she may need additional testing depending on how things proceeded. However she did not come back until just now when I'm seeing her for reevaluation today. With that being said she does not seem to have Gabriela severe pain. She does continue to have drainage from the site and it has never really fully cleared and closed out as would be appropriate for a wound of this nature. Some of this may be due to the fact that she is not having appropriate follow-up and so we are not able to truly have this granulated appropriately. With that being said it does seem that she may have something more deeply draining and causing problems at this point. The good news is she's not having as much pain as previous. Patient is seen along with her granddaughter in the office at this point in time. The issue that seems to be underlying this wound was explained to both patient as well as her granddaughter  No fevers, chills, nausea, or vomiting noted at this time. Again this sounds to have started initially has managed by Dr. Sabra Heck according to patient as an abscess that unfortunately has had a very difficult time healing. After reviewing the notes here further we may need to consider an MRI depending on how she is progressing at the next follow-up. We will see how things are doing. Electronic Signature(s) Signed: 09/06/2017 7:56:11 AM By: Worthy Keeler PA-C Entered By: Worthy Keeler on 09/05/2017 11:01:15 Betton, [name] ([id number]) -------------------------------------------------------------------------------- Physical Exam Details Patient Name: Gabriela [name], [name] Date of Service: 09/05/2017 8:00 AM Medical Record Number: [id number] Patient Account Number: [id number] Date of Birth/Sex: February 12, 1926 (84 y.o. Female) Treating RN: Montey Hora Primary Care Provider: Cranford Mon, Delfino Lovett Other Clinician: Referring Provider: Cranford Mon, Delfino Lovett Treating Provider/Extender: Melburn Hake, HOYT Weeks in Treatment: 0 Constitutional patient is hypertensive.. pulse regular and within target range for patient.Marland Kitchen respirations regular, non-labored and within target range for patient.Marland Kitchen temperature within target range for patient.. Well-nourished and well-hydrated in no acute distress. Eyes conjunctiva clear no eyelid edema noted. pupils equal round and reactive to light and accommodation. Ears, Nose, Mouth, and Throat no gross abnormality of ear auricles or external auditory canals. normal hearing noted during conversation. mucus membranes moist. Respiratory normal breathing without difficulty. clear to auscultation bilaterally. Cardiovascular regular rate and rhythm with normal S1, S2. no clubbing, cyanosis, significant edema, <3 sec cap refill. Gastrointestinal (GI) soft, non-tender, non-distended, +BS. no ventral hernia noted. Musculoskeletal Patient unable to walk without  assistance. Psychiatric this patient is able to make decisions and demonstrates good insight into disease process. Alert and Oriented x 3. pleasant and cooperative. Notes After discussing the treatment option with the patient including the need for debridement to clean up the wound and see how deeply this goes she did  want to proceed in that regard. This was explained to patient's granddaughter who was present with her today as well. I didn't did perform debridement and found that the wound did go much more significantly deep and what was originally thought. This was cleaned out well and the wound does taper down to a small opening although the depth was 2.4 cm post debridement and had been surface level prior. Obviously I think this may be why the wound is not healing it has always tunneled much more deeply we need to keep this open with packing. Electronic Signature(s) Signed: 09/06/2017 7:56:11 AM By: Worthy Keeler PA-C Entered By: Worthy Keeler on 09/05/2017 11:03:50 Mehlberg, Averiana M. ([id number]) -------------------------------------------------------------------------------- Physician Orders Details Patient Name: Gabriela [name], [name] Date of Service: 09/05/2017 8:00 AM Medical Record Number: [id number] Patient Account Number: [id number] Date of Birth/Sex: [birth year] (84 y.o. 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 o May Shower, gently pat  wound dry prior to applying new dressing. Anesthetic (add to Medication List) Wound #3 Right,Anterior Upper Leg o Topical Lidocaine 4% cream applied to wound bed prior to debridement (In Clinic Only). o Injected 2% Lidocaine without epinephrine prior to debridement (In Clinic Only). Primary Wound Dressing Wound #3 Right,Anterior Upper Leg o Iodoform packing Gauze Secondary Dressing Wound #3 Right,Anterior Upper Leg o Dry Gauze o Boardered Foam Dressing Dressing Change Frequency Wound #3 Right,Anterior Upper Leg o Change dressing every day. Follow-up Appointments Wound #3 Right,Anterior Upper Leg o Return Appointment in 1 week. Additional Orders / Instructions Wound #3 Right,Anterior Upper Leg o Increase protein intake. Home Health Wound #3 Right,Anterior Upper Leg Weiler, [name] ([id number]) o [place] Visits - [place] please call [facility] and let us know which [place] is visiting patient so signed orders can be faxed - [place] Nurse may visit PRN to address patientos wound care needs. o FACE TO FACE ENCOUNTER: MEDICARE and MEDICAID PATIENTS: I certify that this patient is under my care and that I had a face-to-face encounter that meets the physician face-to-face encounter requirements with this patient on this date. The encounter with the patient was in whole or in part for the following MEDICAL CONDITION: (primary reason for [place]) MEDICAL NECESSITY: I certify, that based on my findings, NURSING services are a medically necessary home health service. HOME BOUND STATUS: I certify that my clinical findings support that this patient is homebound (i.e., Due to illness or injury, pt requires aid of supportive devices such as crutches, cane, wheelchairs, walkers, the use of special transportation or the assistance of another person to leave their place of residence. There is a normal inability to  leave the home and doing so requires considerable and taxing effort. Other absences are for medical reasons / religious services and are infrequent or of short duration when for other reasons). o If current dressing causes regression in wound condition, may D/C ordered dressing product/s and apply Normal Saline Moist Dressing daily until next [place] / Other MD appointment.  [place] of regression in wound condition at [phone]. o Please direct Gabriela NON-WOUND related issues/requests for orders to patient's Primary Care Physician Electronic Signature(s) Signed: 09/05/2017 4:50:14 PM By: Montey Hora Signed: 09/06/2017 7:56:11 AM By: Worthy Keeler PA-C Entered By: Montey Hora on 09/05/2017 09:19:49 Veracruz, Aubryn M. ([id number]) -------------------------------------------------------------------------------- Problem List Details Patient Name: Gabriela [name], [name] Date of Service: 09/05/2017 8:00 AM Medical Record Number: [id number] Patient Account Number: [id number] Date of Birth/Sex: 04-30-1926 (84 y.o. 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 ([id number]) -------------------------------------------------------------------------------- Progress Note  Details Patient Name: Gabriela [name], [name] Date of Service: 09/05/2017 8:00 AM Medical Record Number: [id number] Patient Account Number: [id number] Date of Birth/Sex: 1925-10-11 (84 y.o. Female) Treating RN: Montey Hora Primary Care Provider: Cranford Mon, Delfino Lovett Other Clinician: Referring Provider: Cranford Mon, Delfino Lovett Treating Provider/Extender: Worthy Keeler Weeks in Treatment: 0 Subjective Chief Complaint Information obtained from Patient Patients presents for treatment of an open diabetic ulcer To the right thigh which has been opened for about 3 months 07/12/17; patient reprocessed today for the same open area on her right thigh. Previously seen here in September 2018 09/05/17 Re-evaluation of the right thigh ulcer History of Present Illness (HPI) 84 year old female with right thigh abscess and ulceration of the right thigh noted by her orthopedic surgeon Dr. Kurtis Bushman. The patient has had right total hip arthroplasty with ostial lysis done recently in June 2017 and x-ray done postoperatively looked good. Patient was previously treated with Keflex and Septra. reviewing the orthopedic notes Dr. Sabra Heck had done a IandD of the abscess somewhere in June and at some stage had cauterized hyperbaric granulation tissue which continues to have drainage from the wound. 07/12/17; this is a patient who was seen one time by Dr. Con Memos on 13/04/2017. She did not return to our clinic. She was dressed at that point with [facility]. I think she has been followed by orthopedics A Dr Harlow Mares and Dr. Sabra Heck of orthopedics for this area since then. He has been using peroxide to the area. The history here is a bit difficult to follow. It sounds as though she had an abscess develop roughly 6 months ago. There was no trauma. Apparently she had an IandD done but I don't see Gabriela culture results. X-rays have been done that show her previous total hip replacement to be in good position. Her total hip  replacement was done 20 years ago per the patient and she had not had Gabriela problems in the interim. She has not had Gabriela pain in the area. She has no systemic symptoms including fever or chills. She is a type II diabetic on oral agents and Levemir 09/05/17 patient presents concerning her right thigh separation which has for the most part on inspection today closed over with epithelium although there is a small problematic for her. Nonetheless when she last saw Dr. Dellia Nims on July 12, 2017 he didn't do an incision and drainage and  happened to mention that she may need additional testing depending on how things proceeded. However she did not come back until just now when I'm seeing her for reevaluation today. With that being said she does not seem to have Gabriela severe pain. She does continue to have drainage from the site and it has never really fully cleared and closed out as would be appropriate for a wound of this nature. Some of this may be due to the fact that she is not having appropriate follow-up and so we are not able to truly have this granulated appropriately. With that being said it does seem that she may have something more deeply draining and causing problems at this point. The good news is she's not having as much pain as previous. Patient is seen along with her granddaughter in the office at this point in time. The issue that seems to be underlying this wound was explained to both patient as well as her granddaughter No fevers, chills, nausea, or vomiting noted at this time. Again this sounds to have started initially has managed by Dr. Sabra Heck according to patient as an abscess that unfortunately has had a very difficult time healing. After reviewing the notes here further we may need to consider an MRI depending on how she is progressing at the next follow-up. We will see how things are doing. Wound History Patient presents with 1 open wound that has been present for approximately 5  months. Patient has been treating wound in the following manner: ointment. Laboratory tests have not been performed in the last month. Patient reportedly has not tested positive for an antibiotic resistant organism. Patient reportedly has not tested positive for osteomyelitis. Patient reportedly has not had testing performed to evaluate circulation in the legs. Patient History Information obtained from Patient. Dimmer, [name] ([id number]) Allergies No Known Drug Allergies Family History Cancer - Child, Diabetes - Child, Heart Disease - Child, No family history of Hypertension, Kidney Disease, Lung Disease, Seizures, Stroke, Thyroid Problems, Tuberculosis. Social History Never smoker, Marital Status - Widowed, Alcohol Use - Never, Drug Use - No History, Caffeine Use - Daily. Medical And Surgical History Notes Constitutional Symptoms (General Health) High Blood Pressure, Diabetes, Aspirin for heart, fluid pil Cardiovascular Heart Bypass Objective Constitutional patient is hypertensive.. pulse regular and within target range for patient.Marland Kitchen respirations regular, non-labored and within target range for patient.Marland Kitchen temperature within target range for patient.. Well-nourished and well-hydrated in no acute distress. Vitals Time Taken: 8:18 AM, Height: 63 in, Source: Measured, Weight: 145 lbs, Source: Measured, BMI: 25.7, Temperature: 98.3 F, Pulse: 60 bpm, Respiratory Rate: 18 breaths/min, Blood Pressure: 169/91 mmHg. Eyes conjunctiva clear no eyelid edema noted. pupils equal round and reactive to light and accommodation. Ears, Nose, Mouth, and Throat no gross abnormality of ear auricles or external auditory canals. normal hearing noted during conversation. mucus membranes moist. Respiratory normal breathing without difficulty. clear to auscultation bilaterally. Cardiovascular regular rate and rhythm with normal S1, S2. no clubbing, cyanosis, significant edema, Gastrointestinal  (GI) soft, non-tender, non-distended, +BS. no ventral hernia noted. Musculoskeletal Patient unable to walk without assistance. Psychiatric this patient is able to make decisions and demonstrates good insight into disease process. Alert and Oriented x 3. pleasant and cooperative. Dahmen, Jordynne M. ([id number]) General Notes: After discussing the treatment option with the patient including the need for debridement to clean up the wound and see how deeply this goes she did want to proceed in that regard. This was explained to patient's granddaughter who  was present with her today as well. I didn't did perform debridement and found that the wound did go much more significantly deep and what was originally thought. This was cleaned out well and the wound does taper down to a small opening although the depth was 2.4 cm post debridement and had been surface level prior. Obviously I think this may be why the wound is not healing it has always tunneled much more deeply we need to keep this open with packing. Integumentary (Hair, Skin) Wound #3 status is Open. Original cause of wound was Bump. The wound is located on the Right,Anterior Upper Leg. The wound measures 0.7cm length x 1cm width x 0.1cm depth; 0.55cm^2 area and 0.055cm^3 volume. There is no tunneling or undermining noted. There is a large amount of purulent drainage noted. The wound margin is flat and intact. There is no granulation within the wound bed. There is no necrotic tissue within the wound bed. The periwound skin appearance did not exhibit: Callus, Crepitus, Excoriation, Induration, Rash, Scarring, Dry/Scaly, Maceration, Atrophie Blanche, Cyanosis, Ecchymosis, Hemosiderin Staining, Mottled, Pallor, Rubor, Erythema. Periwound temperature was noted as No Abnormality. The periwound has tenderness on palpation. 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 There was a Skin/Subcutaneous Tissue Debridement (11042- 11047) debridement with total area of 0.7 sq cm performed by STONE III, HOYT E., PA-C. with the following instrument(s): Curette, Forceps, and Scissors to remove Viable and Non-Viable tissue/material including Fibrin/Slough, Skin, and Subcutaneous after achieving pain control using Lidocaine Injectable: 2%. A time out was conducted at 09:08, prior to the start of the procedure. A Moderate amount of bleeding was controlled with Pressure. The procedure was tolerated well with a pain level of 0 throughout and a pain level of 0 following the procedure. Post Debridement Measurements: 0.9cm length x 1.1cm width x 2.4cm depth; 1.866cm^3 volume. Character of Wound/Ulcer Post Debridement is improved. Severity of Tissue Post Debridement is: Fat layer exposed. Post procedure Diagnosis Wound #3: Same as Pre-Procedure Plan Wound Cleansing: Wound #3 Right,Anterior Upper Leg: Wingerter, [name] ([id number]) Clean wound with Normal Saline. May Shower, gently pat wound dry prior to applying new dressing. Anesthetic (add to Medication List): Wound #3 Right,Anterior Upper Leg: Topical Lidocaine 4% cream applied to wound bed prior to debridement (In Clinic Only). Injected 2% Lidocaine without epinephrine prior to debridement (In Clinic Only). Primary Wound Dressing: Wound #3 Right,Anterior Upper Leg: Iodoform packing Gauze Secondary Dressing: Wound #3 Right,Anterior Upper Leg: Dry Gauze Boardered Foam Dressing Dressing Change Frequency: Wound #3 Right,Anterior Upper Leg: Change dressing every  day. Follow-up Appointments: Wound #3 Right,Anterior Upper Leg: Return Appointment in 1 week. Additional Orders / Instructions: Wound #3 Right,Anterior Upper Leg: Increase protein intake. Home Health: Wound #3 Right,Anterior Upper Leg: Continue Home Health Visits - [place] please call [facility] and let us know which [place] is visiting patient so signed orders can be faxed - [place] Nurse may visit PRN to address patient s wound care needs. FACE TO FACE ENCOUNTER: MEDICARE and MEDICAID PATIENTS: I certify that this patient is under my care and that I  had a face-to-face encounter that meets the physician face-to-face encounter requirements with this patient on this date. The encounter with the patient was in whole or in part for the following MEDICAL CONDITION: (primary reason for [place]) MEDICAL NECESSITY: I certify, that based on my findings, NURSING services are a medically necessary home health service. HOME BOUND STATUS: I certify that my clinical findings support that this patient is homebound (i.e., Due to illness or injury, pt requires aid of supportive devices such as crutches, cane, wheelchairs, walkers, the use of special transportation or the assistance of another person to leave their place of residence. There is a normal inability to leave the home and doing so requires considerable and taxing effort. Other absences are for medical reasons / religious services and are infrequent or of short duration when for other reasons). If current dressing causes regression in wound condition, may D/C ordered dressing product/s and apply Normal Saline Moist Dressing daily until next [place] / Other MD appointment. [place] of regression in wound condition at [phone]. Please direct Gabriela NON-WOUND related issues/requests for orders to patient's Primary Care Physician At this point I'm going to suggest that  we number one initiate care with Iodoform gauze packing which needs to be sure and be packed all the way into the base of the wound. Otherwise we are gonna continue to collect fluid and patient is going to continue to have this issue with the wound. Subsequently I'm also going to suggest that we consider an MRI at the next visit if the symptoms and the wound in particular not doing significantly better by that time. Patient is in agreement with this plan for the time being. We will see were things stand in one weeks time. If anything changes in the interim he will contact our office. Please see above for specific wound care orders. We will see patient for re-evaluation in 1 week(s) here in the clinic. If anything worsens or changes patient will contact our office for additional recommendations. Electronic Signature(s) Signed: 09/06/2017 7:56:11 AM By: Worthy Keeler PA-C Beecher, [name] ([id number]) Entered By: Worthy Keeler on 09/05/2017 13:45:04 Besancon, [name] ([id number]) -------------------------------------------------------------------------------- ROS/PFSH Details Patient Name: MYKENZI, VANZILE. Date of Service: 09/05/2017 8:00 AM Medical Record Number: [id number] Patient Account Number: [id number] Date of Birth/Sex: 1926-04-23 (84 y.o. Female) Treating RN: Montey Hora Primary Care Provider: Cranford Mon, Delfino Lovett Other Clinician: Referring Provider: Cranford Mon, Delfino Lovett Treating Provider/Extender: Melburn Hake, HOYT Weeks in Treatment: 0 Information Obtained From Patient Wound History Do you currently have one or more open woundso Yes How many open wounds do you currently haveo 1 Approximately how long have you had your woundso 5 months How have you been treating your wound(s) until nowo ointment Has your wound(s) ever healed and then re-openedo No Have you had Gabriela lab work done in the past montho No Have you tested positive for an antibiotic resistant organism (MRSA, VRE)o  No Have you tested positive for osteomyelitis (bone infection)o No Have you had Gabriela tests for circulation on your legso No Constitutional Symptoms (General Health) Medical History: Past Medical History Notes: High Blood Pressure, Diabetes, Aspirin for heart, fluid pil Eyes Medical History: Positive for: Cataracts - removed; Glaucoma Negative for: Optic Neuritis Ear/Nose/Mouth/Throat Medical History: Negative for: Chronic sinus problems/congestion; Middle ear problems Hematologic/Lymphatic Medical History: Positive for: Anemia Negative for: Hemophilia; Human Immunodeficiency Virus; Lymphedema; Sickle Cell Disease Respiratory Medical History: Negative for: Aspiration; Asthma; Chronic Obstructive Pulmonary Disease (COPD);  Pneumothorax; Sleep Apnea; Tuberculosis Cardiovascular Medical History: Positive for: Hypertension Negative for: Angina; Arrhythmia; Congestive Heart Failure; Coronary Artery Disease; Deep Vein Thrombosis; Hypotension; Myocardial Infarction; Peripheral Arterial Disease; Peripheral Venous Disease; Phlebitis; Vasculitis Past Medical History Notes: Hargett, 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 Gastrointestinal Medical History: Negative for: Cirrhosis ; Colitis; Crohnos; Hepatitis A; Hepatitis C Endocrine Medical History: Positive for: Type II Diabetes Negative for: Type I Diabetes Time with diabetes: 50 Treated with: Insulin Blood sugar tested every day: No Genitourinary Medical History: Negative for: End Stage Renal Disease Immunological Medical History: Negative for: Lupus Erythematosus; Raynaudos; Scleroderma Integumentary (Skin) Medical History: Negative for: History of Burn; History of pressure wounds Musculoskeletal Medical History: Positive for: Osteoarthritis Negative for: Gout; Rheumatoid Arthritis; Osteomyelitis Neurologic Medical History: Negative for: Dementia; Neuropathy; Quadriplegia; Paraplegia; Seizure Disorder Oncologic Medical  History: Negative for: Received Chemotherapy; Received Radiation Psychiatric Medical History: Negative for: Anorexia/bulimia; Confinement Anxiety HBO Extended History Items Eyes: Eyes: Cataracts Glaucoma Immunizations Pneumococcal Vaccine: Eckert, [name] ([id number]) Received Pneumococcal Vaccination: Yes Implantable Devices Family and Social History Cancer: Yes - Child; Diabetes: Yes - Child; Heart Disease: Yes - Child; Hypertension: No; Kidney Disease: No; Lung Disease: No; Seizures: No; Stroke: No; Thyroid Problems: No; Tuberculosis: No; Never smoker; Marital Status - Widowed; Alcohol Use: Never; Drug Use: No History; Caffeine Use: Daily; Advanced Directives: No; Patient does not want information on Advanced Directives; Living Will: No; Medical Power of Attorney: No Electronic Signature(s) Signed: 09/05/2017 4:50:14 PM By: Montey Hora Signed: 09/06/2017 7:56:11 AM By: Worthy Keeler PA-C Entered By: Montey Hora on 09/05/2017 08:16:54 Gabriela [name], [name] ([id number]) -------------------------------------------------------------------------------- SuperBill Details Patient Name: TYNIA, WIERS. Date of Service: 09/05/2017 Medical Record Number: [id number] Patient Account Number: [id number] Date of Birth/Sex: November 03, 1925 (84 y.o. 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 Facility Procedures CPT4 Code: 62952841 Description: 32440 - WOUND CARE VISIT-LEV 3 EST PT Modifier: Quantity: 1 CPT4 Code: 10272536 Description: 64403 - DEB SUBQ TISSUE 20 SQ  CM/< ICD-10 Diagnosis Description K74.259 Non-pressure chronic ulcer of right thigh with fat layer exp Modifier: osed Quantity: 1 Physician Procedures CPT4 Code: 5638756 Description: 43329 - WC PHYS LEVEL 3 - EST PT ICD-10 Diagnosis Description E11.622 Type 2 diabetes mellitus with other skin ulcer L97.112 Non-pressure chronic ulcer of right thigh with fat layer ex I10 Essential (primary) hypertension E11.21 Type 2  diabetes mellitus with diabetic nephropathy Modifier: 25 posed Quantity: 1 CPT4 Code: 5188416 Description: 60630 - WC PHYS SUBQ TISS 20 SQ CM ICD-10 Diagnosis Description Z60.109 Non-pressure chronic ulcer of right thigh with fat layer ex Modifier: posed Quantity: 1 Electronic Signature(s) Signed: 09/06/2017 7:56:11 AM By: Worthy Keeler PA-C Entered By: Worthy Keeler on 09/05/2017 14:00:30

## 2017-09-06 NOTE — Progress Notes (Signed)
Machia, Benedetta M. ([id number]) Visit Report for 09/05/2017 Allergy List Details Patient Name: Gabriela, BAYLISS. Date of Service: 09/05/2017 8:00 AM Medical Record Number: [id number] Patient Account Number: [id number] Date of Birth/Sex: 1925-10-08 (84 y.o. Female) Treating RN: Montey Hora Primary Care Kymani Shimabukuro: Cranford Mon, Delfino Lovett Other Clinician: Referring Clell Trahan: Cranford Mon, Delfino Lovett Treating Jaquese Irving/Extender: Melburn Hake, HOYT Weeks in Treatment: 0 Allergies Active Allergies No Known Drug Allergies Allergy Notes Electronic Signature(s) Signed: 09/05/2017 4:50:14 PM By: Montey Hora Entered By: Montey Hora on 09/05/2017 08:15:25 Balfour, Isatou M. ([id number]) -------------------------------------------------------------------------------- Arrival Information Details Patient Name: Gabriela, [name]. Date of Service: 09/05/2017 8:00 AM Medical Record Number: [id number] Patient Account Number: [id number] Date of Birth/Sex: [birth year] (84 y.o. Female) Treating RN: Montey Hora Primary Care Halena Mohar: Cranford Mon, Delfino Lovett Other Clinician: Referring Jarquez Mestre: Wilhemena Durie Treating Rosezella Kronick/Extender: Sharalyn Ink in Treatment: 0 Visit Information Patient Arrived: Walker Arrival Time: 08:14 Accompanied By: self Transfer Assistance: None Patient Identification Verified: Yes Secondary Verification Process Yes Completed: Patient Has Alerts: Yes Patient Alerts: DMII ABI [place] BILATERAL >220 History Since Last Visit Added or deleted any medications: No Any new allergies or adverse reactions: No Had a fall or experienced change in activities of daily living that may affect risk of falls: No Signs or symptoms of abuse/neglect since last visito No Hospitalized since last visit: No Electronic Signature(s) Signed: 09/05/2017 4:50:14 PM By: Montey Hora Entered By: Montey Hora on 09/05/2017 08:59:27 Macera, Shirin M.  ([id number]) -------------------------------------------------------------------------------- Clinic Level of Care Assessment Details Patient Name: Gabriela, [name]. Date of Service: 09/05/2017 8:00 AM Medical Record Number: [id number] Patient Account Number: [id number] Date of Birth/Sex: May 25, 1926 (84 y.o. Female) Treating RN: Montey Hora Primary Care Monay Houlton: Cranford Mon, Delfino Lovett Other Clinician: Referring Caiden Arteaga: Cranford Mon, Delfino Lovett Treating Arleta Ostrum/Extender: Melburn Hake, HOYT Weeks in Treatment: 0 Clinic Level of Care Assessment Items TOOL 1 Quantity Score []  - Use when EandM and Procedure is performed on INITIAL visit 0 ASSESSMENTS - Nursing Assessment / Reassessment X - General Physical Exam (combine w/ comprehensive assessment (listed just below) when 1 20 performed on new pt. evals) X- 1 25 Comprehensive Assessment (HX, ROS, Risk Assessments, Wounds Hx, etc.) ASSESSMENTS - Wound and Skin Assessment / Reassessment []  - Dermatologic / Skin Assessment (not related to wound area) 0 ASSESSMENTS - Ostomy and/or Continence Assessment and Care []  - Incontinence Assessment and Management 0 []  - 0 Ostomy Care Assessment and Management (repouching, etc.) PROCESS - Coordination of Care X - Simple Patient / Family Education for ongoing care 1 15 []  - 0 Complex (extensive) Patient / Family Education for ongoing care X- 1 10 Staff obtains Programmer, systems, Records, Test Results / Process Orders []  - 0 Staff telephones HHA, Nursing Homes / Clarify orders / etc []  - 0 Routine Transfer to another Facility (non-emergent condition) []  - 0 Routine Hospital Admission (non-emergent condition) X- 1 15 New Admissions / Biomedical engineer / Ordering NPWT, Apligraf, etc. []  - 0 Emergency Hospital Admission (emergent condition) PROCESS - Special Needs []  - Pediatric / Minor Patient Management 0 []  - 0 Isolation Patient Management []  - 0 Hearing / Language / Visual special needs []  -  0 Assessment of Community assistance (transportation, D/C planning, etc.) []  - 0 Additional assistance / Altered mentation []  - 0 Support Surface(s) Assessment (bed, cushion, seat, etc.) Qu, Marcelene M. ([id number]) INTERVENTIONS - Miscellaneous []  - External ear exam 0 []  - 0 Patient Transfer (multiple staff / Harrel Lemon Lift / Similar devices) []  - 0 Simple Staple /  Suture removal (25 or less) []  - 0 Complex Staple / Suture removal (26 or more) []  - 0 Hypo/Hyperglycemic Management (do not check if billed separately) X- 1 15 Ankle / Brachial Index (ABI) - do not check if billed separately Has the patient been seen at the hospital within the last three years: Yes Total Score: 100 Level Of Care: New/Established - Level 3 Electronic Signature(s) Signed: 09/05/2017 4:50:14 PM By: Montey Hora Entered By: Montey Hora on 09/05/2017 10:22:18 Humber, Emmajean M. ([id number]) -------------------------------------------------------------------------------- Encounter Discharge Information Details Patient Name: Gabriela, [name]. Date of Service: 09/05/2017 8:00 AM Medical Record Number: [id number] Patient Account Number: [id number] Date of Birth/Sex: [birth year] (84 y.o. Female) Treating RN: Montey Hora Primary Care Sallie Maker: Cranford Mon, Delfino Lovett Other Clinician: Referring Kaelani Kendrick: Wilhemena Durie Treating Marigene Erler/Extender: Melburn Hake, HOYT Weeks in Treatment: 0 Encounter Discharge Information Items Discharge Pain Level: 0 Discharge Condition: Stable Ambulatory Status: Walker Discharge Destination: Home Transportation: Private Auto Accompanied By: self Schedule Follow-up Appointment: Yes Medication Reconciliation completed and No provided to Patient/Care Ricky Gallery: Provided on Clinical Summary of Care: 09/05/2017 Form Type Recipient Paper Patient LB Electronic Signature(s) Signed: 09/05/2017 10:23:23 AM By: Montey Hora Entered By: Montey Hora on 09/05/2017  10:23:23 Swarey, Livier M. ([id number]) -------------------------------------------------------------------------------- Lower Extremity Assessment Details Patient Name: Gabriela, [name]. Date of Service: 09/05/2017 8:00 AM Medical Record Number: [id number] Patient Account Number: [id number] Date of Birth/Sex: 03/22/1926 (84 y.o. Female) Treating RN: Montey Hora Primary Care Stellah Donovan: Cranford Mon, Delfino Lovett Other Clinician: Referring Anhad Sheeley: Cranford Mon, RICHARD Treating Tatiyanna Lashley/Extender: Melburn Hake, HOYT Weeks in Treatment: 0 Vascular Assessment Pulses: Dorsalis Pedis Palpable: [Left:Yes] [Right:Yes] Doppler Audible: [Left:Yes] [Right:Yes] Posterior Tibial Palpable: [Left:Yes] [Right:Yes] Doppler Audible: [Left:Yes] [Right:Yes] Extremity colors, hair growth, and conditions: Extremity Color: [Left:Normal] [Right:Normal] Hair Growth on Extremity: [Left:No] [Right:No] Temperature of Extremity: [Left:Cool] [Right:Cool] Capillary Refill: [Left:< 3 seconds] [Right:< 3 seconds] Toe Nail Assessment Left: Right: Thick: Yes Yes Discolored: Yes Yes Deformed: Yes Yes Improper Length and Hygiene: Yes Yes Notes ABI [place] BILATERAL >220 Electronic Signature(s) Signed: 09/05/2017 4:50:14 PM By: Montey Hora Entered By: Montey Hora on 09/05/2017 08:39:45 Martorana, Yazmyn M. ([id number]) -------------------------------------------------------------------------------- Multi Wound Chart Details Patient Name: Gabriela, [name]. Date of Service: 09/05/2017 8:00 AM Medical Record Number: [id number] Patient Account Number: [id number] Date of Birth/Sex: [birth year] (84 y.o. 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] Secondary Etiology: [3:Cyst] [N/A:N/A] Comorbid History: [3:Cataracts, Glaucoma, Anemia, N/A Hypertension, Type II Diabetes, Osteoarthritis] Date Acquired: [3:12/19/2016] [N/A:N/A] Weeks of Treatment: [3:0] [N/A:N/A] Wound Status: [3:Open] [N/A:N/A] Measurements L x W x D [3:0.7x1x0.1] [N/A:N/A] (cm) Area (cm) : [3:0.55] [N/A:N/A] Volume (cm) : [3:0.055] [N/A:N/A] Classification: [3:Grade 1] [N/A:N/A] Exudate Amount: [3:Large] [N/A:N/A] Exudate Type: [3:Purulent] [N/A:N/A] Exudate Color: [3:yellow, brown, green] [N/A:N/A] Wound Margin: [3:Flat and Intact] [N/A:N/A] Granulation Amount: [3:None Present (0%)] [N/A:N/A] Necrotic Amount: [3:None Present (0%)] [N/A:N/A] Exposed Structures: [3:Fascia: No Fat Layer (Subcutaneous Tissue) Exposed: No Tendon: No Muscle: No Joint: No Bone: No] [N/A:N/A] Epithelialization: [3:Large (67-100%)] [N/A:N/A] Periwound Skin Texture: [3:Excoriation: No Induration: No Callus: No Crepitus: No Rash: No Scarring: No] [N/A:N/A] Periwound Skin Moisture: Maceration: No N/A N/A Dry/Scaly: No  Periwound Skin Color: Atrophie Blanche: No N/A N/A Cyanosis: No Ecchymosis: No Erythema: No Hemosiderin Staining: No Mottled: No Pallor: No Rubor: No Temperature: No Abnormality N/A N/A Tenderness on Palpation: Yes N/A N/A Wound Preparation: Ulcer Cleansing: N/A N/A Rinsed/Irrigated with Saline Topical Anesthetic Applied: Other: lidocaine 4% Treatment Notes Electronic Signature(s) Signed: 09/05/2017 4:50:14 PM By: Montey Hora Entered By: Montey Hora on 09/05/2017 09:00:40 Widrig, Mariza M.  ([id number]) -------------------------------------------------------------------------------- [place] Details Patient Name: Gabriela, [name]. Date of Service: 09/05/2017 8:00 AM Medical Record Number: [id number] Patient Account Number: [id number] Date of Birth/Sex: 1926-05-05 (84 y.o. Female) Treating RN: Montey Hora Primary Care Chyenne Sobczak: Cranford Mon, Delfino Lovett Other Clinician: Referring Trudee Chirino: Wilhemena Durie Treating Anise Harbin/Extender: Melburn Hake, HOYT Weeks in Treatment: 0 Active Inactive ` Abuse / Safety / Falls / Self Care Management Nursing Diagnoses: Potential for falls Goals: Patient will remain injury free related to falls Date Initiated: 09/05/2017 Target Resolution Date: 11/18/2017 Goal Status: Active Interventions: Assess fall risk on admission and as needed Notes: ` Orientation to the Wound Care Program Nursing Diagnoses: Knowledge deficit related to the wound healing center program Goals: Patient/caregiver will verbalize understanding of the [place] Date Initiated: 09/05/2017 Target Resolution Date: 11/18/2017 Goal Status: Active Interventions: Provide education on orientation to the wound center Notes: ` Wound/Skin Impairment Nursing Diagnoses: Impaired tissue integrity Goals: Ulcer/skin breakdown will heal within 14 weeks Date Initiated: 09/05/2017 Target Resolution Date: 11/18/2017 Goal Status: Active Interventions: Hilliker, Markel M. ([id number]) Assess patient/caregiver ability to obtain necessary supplies Assess patient/caregiver ability to perform ulcer/skin care regimen upon admission and as needed Assess ulceration(s) every visit Notes: Electronic Signature(s) Signed: 09/05/2017 4:50:14 PM By: Montey Hora Entered By: Montey Hora on 09/05/2017 09:00:23 Lythgoe, Letonya M. ([id number]) -------------------------------------------------------------------------------- Pain Assessment Details Patient  Name: Gabriela, [name]. Date of Service: 09/05/2017 8:00 AM Medical Record Number: [id number] Patient Account Number: [id number] Date of Birth/Sex: 1926/03/10 (84 y.o. Female) Treating RN: Montey Hora Primary Care Avriana Joo: Cranford Mon, Delfino Lovett Other Clinician: Referring Joseh Sjogren: Cranford Mon, Delfino Lovett Treating Destinee Taber/Extender: Melburn Hake, HOYT Weeks in Treatment: 0 Active Problems Location of Pain Severity and Description of Pain Patient Has Paino No Site Locations Pain Management and Medication Current Pain Management: Notes Topical or injectable lidocaine is offered to patient for acute pain when surgical debridement is performed. If needed, Patient is instructed to use over the counter pain medication for the following 24-48 hours after debridement. Wound care MDs do not prescribed pain medications. Patient has chronic pain or uncontrolled pain. Patient has been instructed to make an appointment with their Primary Care Physician for pain management. Electronic Signature(s) Signed: 09/05/2017 4:50:14 PM By: Montey Hora Entered By: Montey Hora on 09/05/2017 08:14:56 Halling, Bryella M. ([id number]) -------------------------------------------------------------------------------- Patient/Caregiver Education Details Patient Name: Gabriela, [name]. Date of Service: 09/05/2017 8:00 AM Medical Record Number: [id number] Patient Account Number: [id number] Date of Birth/Gender: 10-Sep-1925 (84 y.o. Female) Treating RN: Montey Hora Primary Care Physician: Cranford Mon, Delfino Lovett Other Clinician: Referring Physician: Wilhemena Durie Treating Physician/Extender: Sharalyn Ink in Treatment: 0 Education Assessment Education Provided To: Patient and Caregiver HHRN via written orders Education Topics Provided Wound/Skin Impairment: Handouts: Other: wound care as ordered Methods: Demonstration, Explain/Verbal, Printed Responses: State content correctly Electronic  Signature(s) Signed: 09/05/2017 4:50:14 PM By: Montey Hora Entered By: Montey Hora on 09/05/2017 10:23:50 Twist, Rehema M. ([id number]) -------------------------------------------------------------------------------- Wound Assessment Details Patient Name: Gabriela, [name]. Date of Service: 09/05/2017 8:00 AM Medical Record Number: [id number] Patient Account Number: [id number] Date of  Birth/Sex: [birth year] (84 y.o. Female) Treating RN: Montey Hora Primary Care Starkisha Tullis: Cranford Mon, Delfino Lovett Other Clinician: Referring Areli Frary: Cranford Mon, Delfino Lovett Treating Rhealynn Myhre/Extender: Melburn Hake, HOYT Weeks in Treatment: 0 Wound Status Wound Number: 3 Primary Diabetic Wound/Ulcer of the Lower Extremity Etiology: Wound Location: Right Upper Leg - Anterior Secondary Cyst Wounding Event: Bump Etiology: Date Acquired: 12/19/2016 Wound Status: Open Weeks Of Treatment: 0 Comorbid Cataracts, Glaucoma, Anemia, Hypertension, Clustered Wound: No History: Type II Diabetes, Osteoarthritis Photos Photo Uploaded By: Montey Hora on 09/05/2017 11:01:36 Wound Measurements Length: (cm) 0.7 Width: (cm) 1 Depth: (cm) 0.1 Area: (cm) 0.55 Volume: (cm) 0.055 % Reduction in Area: % Reduction in Volume: Epithelialization: Large (67-100%) Tunneling: No Undermining: No Wound Description Classification: Grade 1 Wound Margin: Flat and Intact Exudate Amount: Large Exudate Type: Purulent Exudate Color: yellow, brown, green Foul Odor After Cleansing: No Slough/Fibrino No Wound Bed Granulation Amount: None Present (0%) Exposed Structure Necrotic Amount: None Present (0%) Fascia Exposed: No Fat Layer (Subcutaneous Tissue) Exposed: No Tendon Exposed: No Muscle Exposed: No Joint Exposed: No Bone Exposed: No Periwound Skin Texture Ramcharan, Andilyn M. ([id number]) Texture Color No Abnormalities Noted: No No Abnormalities Noted: No Callus: No Atrophie Blanche: No Crepitus: No Cyanosis:  No Excoriation: No Ecchymosis: No Induration: No Erythema: No Rash: No Hemosiderin Staining: No Scarring: No Mottled: No Pallor: No Moisture Rubor: No No Abnormalities Noted: No Dry / Scaly: No Temperature / Pain Maceration: No Temperature: No Abnormality Tenderness on Palpation: Yes Wound Preparation Ulcer Cleansing: Rinsed/Irrigated with Saline Topical Anesthetic Applied: Other: lidocaine 4%, Treatment Notes Wound #3 (Right, Anterior Upper Leg) 1. Cleansed with: Clean wound with Normal Saline 2. Anesthetic Topical Lidocaine 4% cream to wound bed prior to debridement 2% Lidocaine injectible without epinephrine prior to debridement 4. Dressing Applied: Iodoform packing Gauze 5. Secondary Dressing Applied Bordered Foam Dressing Dry Gauze Electronic Signature(s) Signed: 09/05/2017 4:50:14 PM By: Montey Hora Entered By: Montey Hora on 09/05/2017 08:30:08 Rieke, Kelle M. ([id number]) -------------------------------------------------------------------------------- Vitals Details Patient Name: Gabriela, [name]. Date of Service: 09/05/2017 8:00 AM Medical Record Number: [id number] Patient Account Number: [id number] Date of Birth/Sex: 1925-08-23 (84 y.o. Female) Treating RN: Montey Hora Primary Care Kemaya Dorner: Cranford Mon, Delfino Lovett Other Clinician: Referring Dashon Mcintire: Cranford Mon, RICHARD Treating Izaiah Tabb/Extender: Melburn Hake, HOYT Weeks in Treatment: 0 Vital Signs Time Taken: 08:18 Temperature (F): 98.3 Height (in): 63 Pulse (bpm): 60 Source: Measured Respiratory Rate (breaths/min): 18 Weight (lbs): 145 Blood Pressure (mmHg): 169/91 Source: Measured Reference Range: 80 - 120 mg / dl Body Mass Index (BMI): 25.7 Electronic Signature(s) Signed: 09/05/2017 4:50:14 PM By: Montey Hora Entered By: Montey Hora on 09/05/2017 08:21:29

## 2017-09-06 NOTE — Progress Notes (Signed)
Hoeppner, Mabell M. ([id number]) Visit Report for 09/05/2017 Abuse/Suicide Risk Screen Details Patient Name: Gabriela [name], [name] Date of Service: 09/05/2017 8:00 AM Medical Record Number: [id number] Patient Account Number: [id number] Date of Birth/Sex: September 17, 1925 (84 y.o. Female) Treating RN: Montey Hora Primary Care Abena Erdman: Cranford Mon, Delfino Lovett Other Clinician: Referring Roby Spalla: Cranford Mon, Delfino Lovett Treating Wyatt Thorstenson/Extender: Melburn Hake, HOYT Weeks in Treatment: 0 Abuse/Suicide Risk Screen Items Answer ABUSE/SUICIDE RISK SCREEN: Has anyone close to you tried to hurt or harm you recentlyo No Do you feel uncomfortable with anyone in your familyo No Has anyone forced you do things that you didnot want to doo No Do you have any thoughts of harming yourselfo No Patient displays signs or symptoms of abuse and/or neglect. No Electronic Signature(s) Signed: 09/05/2017 4:50:14 PM By: Montey Hora Entered By: Montey Hora on 09/05/2017 08:17:02 Vuolo, Trudie M. ([id number]) -------------------------------------------------------------------------------- Activities of Daily Living Details Patient Name: Gabriela [name], [name] Date of Service: 09/05/2017 8:00 AM Medical Record Number: [id number] Patient Account Number: [id number] Date of Birth/Sex: 08/17/1925 (84 y.o. Female) Treating RN: Montey Hora Primary Care Clair Bardwell: Cranford Mon, Delfino Lovett Other Clinician: Referring Raesha Coonrod: Wilhemena Durie Treating Elianne Gubser/Extender: Melburn Hake, HOYT Weeks in Treatment: 0 Activities of Daily Living Items Answer Activities of Daily Living (Please select one for each item) Drive Automobile Not Able Take Medications Completely Able Use Telephone Completely Able Care for Appearance Completely Able Use Toilet Completely Able Bath / Shower Need Assistance Dress Self Completely Able Feed Self Completely Able Walk Completely Able Get In / Out Bed Completely Able Housework Need Assistance Prepare  Meals Completely [place] for Self Need Assistance Electronic Signature(s) Signed: 09/05/2017 4:50:14 PM By: Montey Hora Entered By: Montey Hora on 09/05/2017 08:17:30 Spoon, Selby M. ([id number]) -------------------------------------------------------------------------------- Education Assessment Details Patient Name: Gabriela [name], [name] Date of Service: 09/05/2017 8:00 AM Medical Record Number: [id number] Patient Account Number: [id number] Date of Birth/Sex: Nov 13, 1925 (84 y.o. Female) Treating RN: Montey Hora Primary Care Margarita Bobrowski: Cranford Mon, Delfino Lovett Other Clinician: Referring Jovanny Stephanie: Wilhemena Durie Treating Wylodean Shimmel/Extender: Sharalyn Ink in Treatment: 0 Primary Learner Assessed: Patient Learning Preferences/Education Level/Primary Language Learning Preference: Explanation, Demonstration Highest Education Level: High School Preferred Language: English Cognitive Barrier Assessment/Beliefs Language Barrier: No Translator Needed: No Memory Deficit: No Emotional Barrier: No Cultural/Religious Beliefs Affecting Medical Care: No Physical Barrier Assessment Impaired Vision: No Impaired Hearing: No Decreased Hand dexterity: No Knowledge/Comprehension Assessment Knowledge Level: Low Comprehension Level: Low Ability to understand written Low instructions: Ability to understand verbal Medium instructions: Motivation Assessment Anxiety Level: Calm Cooperation: Cooperative Education Importance: Acknowledges Need Interest in Health Problems: Asks Questions Perception: Coherent Willingness to Engage in Self- Medium Management Activities: Readiness to Engage in Self- Medium Management Activities: Electronic Signature(s) Signed: 09/05/2017 4:50:14 PM By: Montey Hora Entered By: Montey Hora on 09/05/2017 08:18:18 Linebaugh, Catalaya M.  ([id number]) -------------------------------------------------------------------------------- Fall Risk Assessment Details Patient Name: Gabriela [name], [name] Date of Service: 09/05/2017 8:00 AM Medical Record Number: [id number] Patient Account Number: [id number] Date of Birth/Sex: Oct 15, 1925 (84 y.o. Female) Treating RN: Montey Hora Primary Care Thamas Appleyard: Cranford Mon, Delfino Lovett Other Clinician: Referring Yashika Mask: Cranford Mon, Delfino Lovett Treating Mckensie Scotti/Extender: Melburn Hake, HOYT Weeks in Treatment: 0 Fall Risk Assessment Items Have you had 2 or more falls in the last 12 monthso 0 No Have you had any fall that resulted in injury in the last 12 monthso 0 No FALL RISK ASSESSMENT: History of falling - immediate or within 3 months 0 No Secondary diagnosis 0 No Ambulatory aid None/bed  rest/wheelchair/nurse 0 No Crutches/cane/walker 15 Yes Furniture 0 No IV Access/Saline Lock 0 No Gait/Training Normal/bed rest/immobile 0 No Weak 10 Yes Impaired 0 No Mental Status Oriented to own ability 0 Yes Electronic Signature(s) Signed: 09/05/2017 4:50:14 PM By: Montey Hora Entered By: Montey Hora on 09/05/2017 08:18:27 Demps, Sharlyn M. ([id number]) -------------------------------------------------------------------------------- Foot Assessment Details Patient Name: Gabriela [name], [name] Date of Service: 09/05/2017 8:00 AM Medical Record Number: [id number] Patient Account Number: [id number] Date of Birth/Sex: 12/31/1925 (84 y.o. Female) Treating RN: Montey Hora Primary Care Cruz Bong: Cranford Mon, Delfino Lovett Other Clinician: Referring Kaytlynne Neace: Cranford Mon, Delfino Lovett Treating Kaleiah Kutzer/Extender: Melburn Hake, HOYT Weeks in Treatment: 0 Foot Assessment Items Site Locations + = Sensation present, - = Sensation absent, C = Callus, U = Ulcer R = Redness, W = Warmth, M = Maceration, PU = Pre-ulcerative lesion F = Fissure, S = Swelling, D = Dryness Assessment Right: Left: Other Deformity: No No Prior Foot  Ulcer: No No Prior Amputation: No No Charcot Joint: No No Ambulatory Status: Ambulatory With Help Assistance Device: Walker Gait: Steady Electronic Signature(s) Signed: 09/05/2017 4:50:14 PM By: Montey Hora Entered By: Montey Hora on 09/05/2017 08:32:41 Seckman, Syd M. ([id number]) -------------------------------------------------------------------------------- Nutrition Risk Assessment Details Patient Name: Gabriela [name], [name] Date of Service: 09/05/2017 8:00 AM Medical Record Number: [id number] Patient Account Number: [id number] Date of Birth/Sex: 1926-06-16 (84 y.o. Female) Treating RN: Montey Hora Primary Care Meghin Thivierge: Cranford Mon, Delfino Lovett Other Clinician: Referring Salote Weidmann: Cranford Mon, RICHARD Treating Sinclair Alligood/Extender: Melburn Hake, HOYT Weeks in Treatment: 0 Height (in): 63 Weight (lbs): 145 Body Mass Index (BMI): 25.7 Nutrition Risk Assessment Items NUTRITION RISK SCREEN: I have an illness or condition that made me change the kind and/or amount of 0 No food I eat I eat fewer than two meals per day 0 No I eat few fruits and vegetables, or milk products 0 No I have three or more drinks of beer, liquor or wine almost every day 0 No I have tooth or mouth problems that make it hard for me to eat 0 No I don't always have enough money to buy the food I need 0 No I eat alone most of the time 0 No I take three or more different prescribed or over-the-counter drugs a day 1 Yes Without wanting to, I have lost or gained 10 pounds in the last six months 0 No I am not always physically able to shop, cook and/or feed myself 0 No Nutrition Protocols Good Risk Protocol 0 No interventions needed Moderate Risk Protocol Electronic Signature(s) Signed: 09/05/2017 4:50:14 PM By: Montey Hora Entered By: Montey Hora on 09/05/2017 08:18:33

## 2017-09-11 DIAGNOSIS — E11622 Type 2 diabetes mellitus with other skin ulcer: Secondary | ICD-10-CM | POA: Diagnosis not present

## 2017-09-11 DIAGNOSIS — Z794 Long term (current) use of insulin: Secondary | ICD-10-CM | POA: Diagnosis not present

## 2017-09-11 DIAGNOSIS — I10 Essential (primary) hypertension: Secondary | ICD-10-CM | POA: Diagnosis not present

## 2017-09-11 DIAGNOSIS — Z7982 Long term (current) use of aspirin: Secondary | ICD-10-CM | POA: Diagnosis not present

## 2017-09-11 DIAGNOSIS — Z791 Long term (current) use of non-steroidal anti-inflammatories (NSAID): Secondary | ICD-10-CM | POA: Diagnosis not present

## 2017-09-11 DIAGNOSIS — L97112 Non-pressure chronic ulcer of right thigh with fat layer exposed: Secondary | ICD-10-CM | POA: Diagnosis not present

## 2017-09-11 DIAGNOSIS — Z96641 Presence of right artificial hip joint: Secondary | ICD-10-CM | POA: Diagnosis not present

## 2017-09-12 ENCOUNTER — Encounter: Payer: Medicare Other | Admitting: Nurse Practitioner

## 2017-09-12 DIAGNOSIS — Z794 Long term (current) use of insulin: Secondary | ICD-10-CM | POA: Diagnosis not present

## 2017-09-12 DIAGNOSIS — I10 Essential (primary) hypertension: Secondary | ICD-10-CM | POA: Diagnosis not present

## 2017-09-12 DIAGNOSIS — E1121 Type 2 diabetes mellitus with diabetic nephropathy: Secondary | ICD-10-CM | POA: Diagnosis not present

## 2017-09-12 DIAGNOSIS — E11622 Type 2 diabetes mellitus with other skin ulcer: Secondary | ICD-10-CM | POA: Diagnosis not present

## 2017-09-12 DIAGNOSIS — M1990 Unspecified osteoarthritis, unspecified site: Secondary | ICD-10-CM | POA: Diagnosis not present

## 2017-09-12 DIAGNOSIS — H409 Unspecified glaucoma: Secondary | ICD-10-CM | POA: Diagnosis not present

## 2017-09-12 DIAGNOSIS — L97112 Non-pressure chronic ulcer of right thigh with fat layer exposed: Secondary | ICD-10-CM | POA: Diagnosis not present

## 2017-09-12 DIAGNOSIS — Z951 Presence of aortocoronary bypass graft: Secondary | ICD-10-CM | POA: Diagnosis not present

## 2017-09-13 NOTE — Progress Notes (Signed)
[name], [name] ([id number]) Visit Report for 09/12/2017 Chief Complaint Document Details Patient Name: Gabriela [name], [name] Date of Service: 09/12/2017 8:00 AM Medical Record Number: [id number] Patient Account Number: [id number] Date of Birth/Sex: April 25, 1926 (84 y.o. Female) Treating RN: Montey Hora Primary Care Provider: Cranford Mon, Delfino Lovett Other Clinician: Referring Provider: Cranford Mon, RICHARD Treating Provider/Extender: Cathie Olden in Treatment: 1 Information Obtained from: Patient Chief Complaint She presents in follow up for a right anterior thigh wound/abscess Electronic Signature(s) Signed: 09/12/2017 4:14:39 PM By: Lawanda Cousins Entered By: Lawanda Cousins on 09/12/2017 08:23:32 Boyden, Annita M. ([id number]) -------------------------------------------------------------------------------- HPI Details Patient Name: Gabriela [name], [name] Date of Service: 09/12/2017 8:00 AM Medical Record Number: [id number] Patient Account Number: [id number] Date of Birth/Sex: 01-Feb-1926 (84 y.o. Female) Treating RN: Montey Hora Primary Care Provider: Cranford Mon, Delfino Lovett Other Clinician: Referring Provider: Wilhemena Durie Treating Provider/Extender: Cathie Olden in Treatment: 1 History of Present Illness HPI Description: 84 year old female with right thigh abscess and ulceration of the right thigh noted by her orthopedic surgeon Dr. Kurtis Bushman. The patient has had right total hip arthroplasty with ostial lysis done recently in June 2017 and x-ray done postoperatively looked good. Patient was previously treated with Keflex and Septra. reviewing the orthopedic notes Dr. Sabra Heck had done a IandD of the abscess somewhere in June and at some stage had cauterized hyperbaric granulation tissue which continues to have drainage from the wound. 07/12/17; this is a patient who was seen one time by Dr. Con Memos on 13/04/2017. She did not return to our clinic. She was dressed at that point  with [facility]. I think she has been followed by orthopedics A Dr Harlow Mares and Dr. Sabra Heck of orthopedics for this area since then. He has been using peroxide to the area. The history here is a bit difficult to follow. It sounds as though she had an abscess develop roughly 6 months ago. There was no trauma. Apparently she had an IandD done but I don't see any culture results. X-rays have been done that show her previous total hip replacement to be in good position. Her total hip replacement was done 20 years ago per the patient and she had not had any problems in the interim. She has not had any pain in the area. She has no systemic symptoms including fever or chills. She is a type II diabetic on oral agents and Levemir 09/05/17 patient presents concerning her right thigh separation which has for the most part on inspection today closed over with epithelium although there is a small problematic for her. Nonetheless when she last saw Dr. Dellia Nims on July 12, 2017 he didn't do an incision and drainage and happened to mention that she may need additional testing depending on how things proceeded. However she did not come back until just now when I'm seeing her for reevaluation today. With that being said she does not seem to have any severe pain. She does continue to have drainage from the site and it has never really fully cleared and closed out as would be appropriate for a wound of this nature. Some of this may be due to the fact that she is not having appropriate follow-up and so we are not able to truly have this granulated appropriately. With that being said it does seem that she may have something more deeply draining and causing problems at this point. The good news is she's not having as much pain as previous. Patient is seen along with her granddaughter in the  office at this point in time. The issue that seems to be underlying this wound was explained to both patient as well as her  granddaughter No fevers, chills, nausea, or vomiting noted at this time. Again this sounds to have started initially has managed by Dr. Sabra Heck according to patient as an abscess that unfortunately has had a very difficult time healing. After reviewing the notes here further we may need to consider an MRI depending on how she is progressing at the next follow-up. We will see how things are doing. 09/12/17-she is here in follow up evaluation for right anterior thigh abscess. Wound appears clean with no evidence of erythema, fluctuance, induration, minimal drainage. We will discontinue packing and initiate Prisma; no indication for debridement today; follow-up next week Electronic Signature(s) Signed: 09/12/2017 4:14:39 PM By: Lawanda Cousins Entered By: Lawanda Cousins on 09/12/2017 08:24:44 Sherrod, Jalaina M. ([id number]) -------------------------------------------------------------------------------- Physician Orders Details Patient Name: Gabriela [name], [name] Date of Service: 09/12/2017 8:00 AM Medical Record Number: [id number] Patient Account Number: [id number] Date of Birth/Sex: 27-Apr-1926 (84 y.o. 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 o May Shower, gently pat wound dry prior to applying new dressing. Primary Wound Dressing Wound #3 Right,Anterior Upper Leg o Prisma Ag Secondary Dressing Wound #3 Right,Anterior Upper Leg o [place] Dressing  Change Frequency Wound #3 Right,Anterior Upper Leg o Change dressing every other day. Follow-up Appointments Wound #3 Right,Anterior Upper Leg o Return Appointment in 1 week. Additional Orders / Instructions Wound #3 Right,Anterior Upper Leg o Increase protein intake. Home Health Wound #3 [place] Visits - [place] please call [facility] and let us know which [place] is visiting patient so signed orders can be faxed - [place] Nurse may visit PRN to address patientos wound care needs. o FACE TO FACE ENCOUNTER: MEDICARE and MEDICAID PATIENTS: I certify that this patient is under my care and that I had a face-to-face encounter that meets the physician face-to-face encounter requirements with this patient on this date. The encounter with the patient was in whole or in part for the following MEDICAL CONDITION: (primary reason for [place]) MEDICAL NECESSITY: I certify, that based on my findings, Pethtel, Amely M. ([id number]) NURSING services are a medically necessary home health service. HOME BOUND STATUS: I certify that my clinical findings support that this patient is homebound (i.e., Due to illness or injury, pt requires aid of supportive devices such as crutches, cane, wheelchairs, walkers, the use of special transportation or the assistance of another person to leave their place of residence. There is a normal inability to leave the home and doing so requires considerable and taxing effort. Other absences are for medical reasons / religious services and are infrequent or of short duration when for other reasons). o If current dressing causes regression in wound condition, may D/C ordered dressing product/s and apply Normal Saline Moist Dressing daily until next [place] / Other MD appointment. [place] of regression in wound condition at [phone].   o  Please direct any NON-WOUND related issues/requests for orders to patient's Primary Care Physician Electronic Signature(s) Signed: 09/12/2017 4:14:39 PM By: Lawanda Cousins Entered By: Lawanda Cousins on 09/12/2017 08:25:39 Guggisberg, Chonda M. ([id number]) -------------------------------------------------------------------------------- Problem List Details Patient Name: GENEAL, HUEBERT. Date of Service: 09/12/2017 8:00 AM Medical Record Number: [id number] Patient Account Number: [id number] Date of Birth/Sex: [birth year] (84 y.o. 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 ([id number]) -------------------------------------------------------------------------------- Progress Note Details Patient Name: EULONDA, ANDALON. Date of Service: 09/12/2017 8:00 AM Medical Record Number: [id number] Patient Account Number: [id number] Date of Birth/Sex: 1925-10-09 (84 y.o. Female) Treating RN: Montey Hora Primary Care Provider: Cranford Mon, Delfino Lovett Other Clinician: Referring Provider: Cranford Mon, Delfino Lovett Treating Provider/Extender: Cathie Olden in Treatment: 1 Subjective Chief Complaint Information obtained from Patient She presents in follow up for a right anterior thigh wound/abscess History of Present Illness  (HPI) 84 year old female with right thigh abscess and ulceration of the right thigh noted by her orthopedic surgeon Dr. Kurtis Bushman. The patient has had right total hip arthroplasty with ostial lysis done recently in June 2017 and x-ray done postoperatively looked good. Patient was previously treated with Keflex and Septra. reviewing the orthopedic notes Dr. Sabra Heck had done a IandD of the abscess somewhere in June and at some stage had cauterized hyperbaric granulation tissue which continues to have drainage from the wound. 07/12/17; this is a patient who was seen one time by Dr. Con Memos on 13/04/2017. She did not return to our clinic. She was dressed at that point with [facility] I think she has been followed by orthopedics A Dr Harlow Mares and Dr. Sabra Heck of orthopedics for this area since then. He has been using peroxide to the area. The history here is a bit difficult to follow. It sounds as though she had an abscess develop roughly 6 months ago. There was no trauma. Apparently she had an IandD done but I don't see any culture results. X-rays have been done that show her previous total hip replacement to be in good position. Her total hip replacement was done 20 years ago per the patient and she had not had any problems in the interim. She has not had any pain in the area. She has no systemic symptoms including fever or chills. She is a type II diabetic on oral agents and Levemir 09/05/17 patient presents concerning her right thigh separation which has for the most part on inspection today closed over with epithelium although there is a small problematic for her. Nonetheless when she last saw Dr. Dellia Nims on July 12, 2017 he didn't do an incision and drainage and happened to mention that she may need additional testing depending on how things proceeded. However she did not come back until just now when I'm seeing her for reevaluation today. With that being said she does not seem to have any  severe pain. She does continue to have drainage from the site and it has never really fully cleared and  closed out as would be appropriate for a wound of this nature. Some of this may be due to the fact that she is not having appropriate follow-up and so we are not able to truly have this granulated appropriately. With that being said it does seem that she may have something more deeply draining and causing problems at this point. The good news is she's not having as much pain as previous. Patient is seen along with her granddaughter in the office at this point in time. The issue that seems to be underlying this wound was explained to both patient as well as her granddaughter No fevers, chills, nausea, or vomiting noted at this time. Again this sounds to have started initially has managed by Dr. Sabra Heck according to patient as an abscess that unfortunately has had a very difficult time healing. After reviewing the notes here further we may need to consider an MRI depending on how she is progressing at the next follow-up. We will see how things are doing. 09/12/17-she is here in follow up evaluation for right anterior thigh abscess. Wound appears clean with no evidence of erythema, fluctuance, induration, minimal drainage. We will discontinue packing and initiate Prisma; no indication for debridement today; follow-up next week Patient History Information obtained from Patient. Allergies Sulfa (Sulfonamide Antibiotics) Family History Bertini, Jaquelyne M. ([id number]) Cancer - Child, Diabetes - Child, Heart Disease - Child, No family history of Hypertension, Kidney Disease, Lung Disease, Seizures, Stroke, Thyroid Problems, Tuberculosis. Social History Never smoker, Marital Status - Widowed, Alcohol Use - Never, Drug Use - No History, Caffeine Use - Daily. Medical And Surgical History Notes Constitutional Symptoms (General Health) High Blood Pressure, Diabetes, Aspirin for heart, fluid  pil Cardiovascular Heart Bypass Objective Constitutional Vitals Time Taken: 8:11 AM, Height: 63 in, Weight: 145 lbs, BMI: 25.7, Temperature: 98.2 F, Pulse: 72 bpm, Respiratory Rate: 16 breaths/min, Blood Pressure: 182/72 mmHg. General Notes: Patient states she has not taken her blood pressure medication this morning. Patient instructed to take medication as soon as she gets home. Integumentary (Hair, Skin) Wound #3 status is Open. Original cause of wound was Bump. The wound is located on the Right,Anterior Upper Leg. The wound measures 0.9cm length x 0.9cm width x 0.6cm depth; 0.636cm^2 area and 0.382cm^3 volume. There is Fat Layer (Subcutaneous Tissue) Exposed exposed. There is no tunneling or undermining noted. There is a medium amount of serous drainage noted. The wound margin is well defined and not attached to the wound base. There is no granulation within the wound bed. There is no necrotic tissue within the wound bed. The periwound skin appearance exhibited: Hemosiderin Staining. The periwound skin appearance did not exhibit: Callus, Crepitus, Excoriation, Induration, Rash, Scarring, Dry/Scaly, Maceration, Atrophie Blanche, Cyanosis, Ecchymosis, Mottled, Pallor, Rubor, Erythema. Periwound temperature was noted as No Abnormality. The periwound has tenderness on palpation. 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 ([id number]) Plan Wound Cleansing: Wound #3 Right,Anterior Upper Leg: Clean wound with Normal Saline. May Shower, gently pat wound dry prior to applying new dressing. Primary Wound Dressing: Wound #3 Right,Anterior Upper Leg: Prisma Ag Secondary Dressing: Wound #3 Right,Anterior Upper Leg: [place] Dressing Change  Frequency: Wound #3 Right,Anterior Upper Leg: Change dressing every other day. Follow-up Appointments: Wound #3 Right,Anterior Upper Leg: Return Appointment in 1 week. Additional  Orders / Instructions: Wound #3 Right,Anterior Upper Leg: Increase protein intake. Home Health: Wound #3 Right,Anterior Upper Leg: Continue Home Health Visits - [place] please call [facility] and let us know which [place] is visiting patient so signed orders can be faxed - [place] Nurse may visit PRN to address patient s wound care needs. FACE TO FACE ENCOUNTER: MEDICARE and MEDICAID PATIENTS: I certify that this patient is under my care and that I had a face-to-face encounter that meets the physician face-to-face encounter requirements with this patient on this date. The encounter with the patient was in whole or in part for the following MEDICAL CONDITION: (primary reason for [place]) MEDICAL NECESSITY: I certify, that based on my findings, NURSING services are a medically necessary home health service. HOME BOUND STATUS: I certify that my clinical findings support that this patient is homebound (i.e., Due to illness or injury, pt requires aid of supportive devices such as crutches, cane, wheelchairs, walkers, the use of special transportation or the assistance of another person to leave their place of residence. There is a normal inability to leave the home and doing so requires considerable and taxing effort. Other absences are for medical reasons / religious services and are infrequent or of short duration when for other reasons). If current dressing causes regression in wound condition, may D/C ordered dressing product/s and apply Normal Saline Moist Dressing daily until next [place] / Other MD appointment. [place] of regression in wound condition at [phone]. Please direct any NON-WOUND related issues/requests for orders to  patient's Primary Care Physician 1. prisma 2. follow up next week Electronic Signature(s) Signed: 09/12/2017 4:17:00 PM By: Lawanda Cousins Previous Signature: 09/12/2017 4:14:39 PM Version By: Lawanda Cousins Entered By: Lawanda Cousins on 09/12/2017 16:16:59 Diiorio, Kiely M. ([id number]) -------------------------------------------------------------------------------- ROS/PFSH Details Patient Name: ANNICK, DIMAIO. Date of Service: 09/12/2017 8:00 AM Medical Record Number: [id number] Patient Account Number: [id number] Date of Birth/Sex: 1925-09-26 (84 y.o. Female) Treating RN: Montey Hora Primary Care Provider: Cranford Mon, Delfino Lovett Other Clinician: Referring Provider: Cranford Mon, RICHARD Treating Provider/Extender: Cathie Olden in Treatment: 1 Information Obtained From Patient Wound History Do you currently have one or more open woundso Yes How many open wounds do you currently haveo 1 Approximately how long have you had your woundso 5 months How have you been treating your wound(s) until nowo ointment Has your wound(s) ever healed and then re-openedo No Have you had any lab work done in the past montho No Have you tested positive for an antibiotic resistant organism (MRSA, VRE)o No Have you tested positive for osteomyelitis (bone infection)o No Have you had any tests for circulation on your legso No Constitutional Symptoms (General Health) Medical History: Past Medical History Notes: High Blood Pressure, Diabetes, Aspirin for heart, fluid pil Eyes Medical History: Positive for: Cataracts - removed; Glaucoma Negative for: Optic Neuritis Ear/Nose/Mouth/Throat Medical History: Negative for: Chronic sinus problems/congestion; Middle ear problems Hematologic/Lymphatic Medical History: Positive for: Anemia Negative for: Hemophilia; Human Immunodeficiency Virus; Lymphedema; Sickle Cell Disease Respiratory Medical History: Negative for: Aspiration; Asthma; Chronic  Obstructive Pulmonary Disease (COPD); Pneumothorax; Sleep Apnea; Tuberculosis Cardiovascular Medical History: Positive for: Hypertension Negative for: Angina; Arrhythmia; Congestive Heart Failure; Coronary Artery Disease; Deep Vein Thrombosis; Hypotension; Myocardial Infarction; Peripheral Arterial Disease; Peripheral Venous Disease; Phlebitis; Vasculitis Past Medical History Notes: Bellamy, Ayala M. ([id number]) Heart Bypass Gastrointestinal Medical History: Negative for: Cirrhosis ; Colitis; Crohnos; Hepatitis A; Hepatitis C Endocrine Medical  History: Positive for: Type II Diabetes Negative for: Type I Diabetes Time with diabetes: 87 Treated with: Insulin Blood sugar tested every day: No Genitourinary Medical History: Negative for: End Stage Renal Disease Immunological Medical History: Negative for: Lupus Erythematosus; Raynaudos; Scleroderma Integumentary (Skin) Medical History: Negative for: History of Burn; History of pressure wounds Musculoskeletal Medical History: Positive for: Osteoarthritis Negative for: Gout; Rheumatoid Arthritis; Osteomyelitis Neurologic Medical History: Negative for: Dementia; Neuropathy; Quadriplegia; Paraplegia; Seizure Disorder Oncologic Medical History: Negative for: Received Chemotherapy; Received Radiation Psychiatric Medical History: Negative for: Anorexia/bulimia; Confinement Anxiety HBO Extended History Items Eyes: Eyes: Cataracts Glaucoma Immunizations Pneumococcal Vaccine: Hoxworth, Orena M. ([id number]) Received Pneumococcal Vaccination: Yes Implantable Devices Family and Social History Cancer: Yes - Child; Diabetes: Yes - Child; Heart Disease: Yes - Child; Hypertension: No; Kidney Disease: No; Lung Disease: No; Seizures: No; Stroke: No; Thyroid Problems: No; Tuberculosis: No; Never smoker; Marital Status - Widowed; Alcohol Use: Never; Drug Use: No History; Caffeine Use: Daily; Advanced Directives: No; Patient does not want  information on Advanced Directives; Living Will: No; Medical Power of Attorney: No Physician Affirmation I have reviewed and agree with the above information. Electronic Signature(s) Signed: 09/12/2017 4:14:39 PM By: Lawanda Cousins Signed: 09/12/2017 4:56:35 PM By: Montey Hora Entered By: Lawanda Cousins on 09/12/2017 08:25:10 Cinco, Cameka M. ([id number]) -------------------------------------------------------------------------------- SuperBill Details Patient Name: BEDA, DULA. Date of Service: 09/12/2017 Medical Record Number: [id number] Patient Account Number: [id number] Date of Birth/Sex: [birth year] (84 y.o. 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

## 2017-09-14 DIAGNOSIS — Z96641 Presence of right artificial hip joint: Secondary | ICD-10-CM | POA: Diagnosis not present

## 2017-09-14 DIAGNOSIS — Z791 Long term (current) use of non-steroidal anti-inflammatories (NSAID): Secondary | ICD-10-CM | POA: Diagnosis not present

## 2017-09-14 DIAGNOSIS — L97112 Non-pressure chronic ulcer of right thigh with fat layer exposed: Secondary | ICD-10-CM | POA: Diagnosis not present

## 2017-09-14 DIAGNOSIS — Z794 Long term (current) use of insulin: Secondary | ICD-10-CM | POA: Diagnosis not present

## 2017-09-14 DIAGNOSIS — I10 Essential (primary) hypertension: Secondary | ICD-10-CM | POA: Diagnosis not present

## 2017-09-14 DIAGNOSIS — Z7982 Long term (current) use of aspirin: Secondary | ICD-10-CM | POA: Diagnosis not present

## 2017-09-14 DIAGNOSIS — E11622 Type 2 diabetes mellitus with other skin ulcer: Secondary | ICD-10-CM | POA: Diagnosis not present

## 2017-09-17 NOTE — Progress Notes (Signed)
Klasen, Genae M. ([id number]) Visit Report for 09/12/2017 Allergy List Details Patient Name: CHIQUITA, HECKERT. Date of Service: 09/12/2017 8:00 AM Medical Record Number: [id number] Patient Account Number: [id number] Date of Birth/Sex: May 26, 1926 (84 y.o. Female) Treating RN: Cornell Barman Primary Care Banner Huckaba: Cranford Mon, Delfino Lovett Other Clinician: Referring Aleesha Ringstad: Cranford Mon, RICHARD Treating Demone Lyles/Extender: Cathie Olden in Treatment: 1 Allergies Active Allergies Sulfa (Sulfonamide Antibiotics) Allergy Notes Electronic Signature(s) Signed: 09/12/2017 8:37:13 AM By: Gretta Cool, BSN, RN, CWS, Kim RN, BSN Entered By: Gretta Cool, BSN, RN, CWS, Kim on 09/12/2017 08:37:12 Carrell, Ofilia Neas. ([id number]) -------------------------------------------------------------------------------- Arrival Information Details Patient Name: MARKEIA, HARKLESS. Date of Service: 09/12/2017 8:00 AM Medical Record Number: [id number] Patient Account Number: [id number] Date of Birth/Sex: [birth year] (84 y.o. Female) Treating RN: Cornell Barman Primary Care Abu Heavin: Cranford Mon, Delfino Lovett Other Clinician: Referring Myran Arcia: Cranford Mon, RICHARD Treating Faheem Ziemann/Extender: Cathie Olden in Treatment: 1 Visit Information Patient Arrived: Walker Arrival Time: 08:10 Accompanied By: granddaughter in lobby Transfer Assistance: None Patient Has Alerts: Yes Patient Alerts: DMII ABI [place] BILATERAL >220 History Since Last Visit Added or deleted any medications: No Any new allergies or adverse reactions: No Had a fall or experienced change in activities of daily living that may affect risk of falls: No Signs or symptoms of abuse/neglect since last visito No Hospitalized since last visit: No Has Dressing in Place as Prescribed: Yes Has Compression in Place as Prescribed: Yes Electronic Signature(s) Signed: 09/13/2017 5:09:29 PM By: Gretta Cool, BSN, RN, CWS, Kim RN, BSN Entered By: Gretta Cool, BSN, RN, CWS, Kim on 09/12/2017  08:11:08 Michele, Ofilia Neas. ([id number]) -------------------------------------------------------------------------------- Clinic Level of Care Assessment Details Patient Name: PILAR, WESTERGAARD. Date of Service: 09/12/2017 8:00 AM Medical Record Number: [id number] Patient Account Number: [id number] Date of Birth/Sex: 03/21/1926 (84 y.o. Female) Treating RN: Cornell Barman Primary Care Venie Montesinos: Cranford Mon, Delfino Lovett Other Clinician: Referring Brittan Mapel: Cranford Mon, RICHARD Treating Judeen Geralds/Extender: Cathie Olden in Treatment: 1 Clinic Level of Care Assessment Items TOOL 4 Quantity Score []  - Use when only an EandM is performed on FOLLOW-UP visit 0 ASSESSMENTS - Nursing Assessment / Reassessment []  - Reassessment of Co-morbidities (includes updates in patient status) 0 X- 1 5 Reassessment of Adherence to Treatment Plan ASSESSMENTS - Wound and Skin Assessment / Reassessment X - Simple Wound Assessment / Reassessment - one wound 1 5 []  - 0 Complex Wound Assessment / Reassessment - multiple wounds []  - 0 Dermatologic / Skin Assessment (not related to wound area) ASSESSMENTS - Focused Assessment []  - Circumferential Edema Measurements - multi extremities 0 []  - 0 Nutritional Assessment / Counseling / Intervention []  - 0 Lower Extremity Assessment (monofilament, tuning fork, pulses) []  - 0 Peripheral Arterial Disease Assessment (using hand held doppler) ASSESSMENTS - Ostomy and/or Continence Assessment and Care []  - Incontinence Assessment and Management 0 []  - 0 Ostomy Care Assessment and Management (repouching, etc.) PROCESS - Coordination of Care X - Simple Patient / Family Education for ongoing care 1 15 []  - 0 Complex (extensive) Patient / Family Education for ongoing care X- 1 10 Staff obtains Programmer, systems, Records, Test Results / Process Orders []  - 0 Staff telephones HHA, Nursing Homes / Clarify orders / etc []  - 0 Routine Transfer to another Facility (non-emergent  condition) []  - 0 Routine Hospital Admission (non-emergent condition) []  - 0 New Admissions / Biomedical engineer / Ordering NPWT, Apligraf, etc. []  - 0 Emergency Hospital Admission (emergent condition) X- 1 10 Simple Discharge Coordination Christiansen, Zoeann M. ([id number]) []  - 0 Complex (extensive) Discharge  Coordination PROCESS - Special Needs []  - Pediatric / Minor Patient Management 0 []  - 0 Isolation Patient Management []  - 0 Hearing / Language / Visual special needs []  - 0 Assessment of Community assistance (transportation, D/C planning, etc.) []  - 0 Additional assistance / Altered mentation []  - 0 Support Surface(s) Assessment (bed, cushion, seat, etc.) INTERVENTIONS - Wound Cleansing / Measurement X - Simple Wound Cleansing - one wound 1 5 []  - 0 Complex Wound Cleansing - multiple wounds X- 1 5 Wound Imaging (photographs - any number of wounds) []  - 0 Wound Tracing (instead of photographs) X- 1 5 Simple Wound Measurement - one wound []  - 0 Complex Wound Measurement - multiple wounds INTERVENTIONS - Wound Dressings []  - Small Wound Dressing one or multiple wounds 0 X- 1 15 Medium Wound Dressing one or multiple wounds []  - 0 Large Wound Dressing one or multiple wounds []  - 0 Application of Medications - topical []  - 0 Application of Medications - injection INTERVENTIONS - Miscellaneous []  - External ear exam 0 []  - 0 Specimen Collection (cultures, biopsies, blood, body fluids, etc.) []  - 0 Specimen(s) / Culture(s) sent or taken to Lab for analysis []  - 0 Patient Transfer (multiple staff / Civil Service fast streamer / Similar devices) []  - 0 Simple Staple / Suture removal (25 or less) []  - 0 Complex Staple / Suture removal (26 or more) []  - 0 Hypo / Hyperglycemic Management (close monitor of Blood Glucose) []  - 0 Ankle / Brachial Index (ABI) - do not check if billed separately X- 1 5 Vital Signs Maharaj, Breya M. ([id number]) Has the patient been seen at the  hospital within the last three years: Yes Total Score: 80 Level Of Care: New/Established - Level 3 Electronic Signature(s) Signed: 09/13/2017 5:09:29 PM By: Gretta Cool, BSN, RN, CWS, Kim RN, BSN Entered By: Gretta Cool, BSN, RN, CWS, Kim on 09/12/2017 08:25:36 Nery, Ofilia Neas. ([id number]) -------------------------------------------------------------------------------- Encounter Discharge Information Details Patient Name: ELVY, MCLARTY. Date of Service: 09/12/2017 8:00 AM Medical Record Number: [id number] Patient Account Number: [id number] Date of Birth/Sex: 1926-02-01 (84 y.o. Female) Treating RN: Cornell Barman Primary Care Alexiz Cothran: Cranford Mon, Delfino Lovett Other Clinician: Referring Netta Fodge: Cranford Mon, RICHARD Treating Armonee Bojanowski/Extender: Cathie Olden in Treatment: 1 Encounter Discharge Information Items Discharge Pain Level: 0 Discharge Condition: Stable Ambulatory Status: Walker Discharge Destination: Home Transportation: Private Auto granddaughter in Accompanied By: lobby Schedule Follow-up Appointment: Yes Medication Reconciliation completed and Yes provided to Patient/Care Khanh Cordner: Provided on Clinical Summary of Care: 09/12/2017 Form Type Recipient Paper Patient LB Electronic Signature(s) Signed: 09/15/2017 4:27:15 PM By: Ruthine Dose Entered By: Ruthine Dose on 09/12/2017 08:32:20 Propp, Valeria M. ([id number]) -------------------------------------------------------------------------------- Lower Extremity Assessment Details Patient Name: LAKEYSHIA, TUCKERMAN. Date of Service: 09/12/2017 8:00 AM Medical Record Number: [id number] Patient Account Number: [id number] Date of Birth/Sex: 08/07/1926 (84 y.o. Female) Treating RN: Cornell Barman Primary Care Theta Leaf: Cranford Mon, Delfino Lovett Other Clinician: Referring Macyn Shropshire: Cranford Mon, RICHARD Treating Trenity Pha/Extender: Cathie Olden in Treatment: 1 Electronic Signature(s) Signed: 09/13/2017 5:09:29 PM By: Gretta Cool, BSN, RN, CWS,  Kim RN, BSN Entered By: Gretta Cool, BSN, RN, CWS, Kim on 09/12/2017 08:19:54 Courts, Ofilia Neas. ([id number]) -------------------------------------------------------------------------------- Multi Wound Chart Details Patient Name: LASHONNA, RIEKE. Date of Service: 09/12/2017 8:00 AM Medical Record Number: [id number] Patient Account Number: [id number] Date of Birth/Sex: 01/15/1926 (84 y.o. 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] Secondary Etiology: [3:Cyst] [N/A:N/A] Comorbid History: [3:Cataracts, Glaucoma, Anemia, N/A Hypertension, Type II Diabetes, Osteoarthritis] Date Acquired: [3:12/19/2016] [N/A:N/A] Weeks of Treatment: [3:1] [N/A:N/A] Wound Status: [3:Open] [N/A:N/A] Measurements L x W x D [3:0.9x0.9x0.6] [N/A:N/A] (cm) Area (cm) : [3:0.636] [N/A:N/A] Volume (cm) : [3:0.382] [N/A:N/A] % Reduction in Area: [3:-15.60%] [N/A:N/A] % Reduction in Volume: [3:-594.50%] [N/A:N/A] Classification: [3:Grade 2] [N/A:N/A] Exudate Amount: [3:Medium] [N/A:N/A] Exudate Type: [3:Serous] [N/A:N/A] Exudate Color: [3:amber] [N/A:N/A] Wound Margin: [3:Well defined, not attached] [N/A:N/A] Granulation Amount: [3:None Present (0%)] [N/A:N/A] Necrotic Amount: [3:None Present (0%)] [N/A:N/A] Exposed Structures: [3:Fat Layer (Subcutaneous Tissue) Exposed: Yes Fascia: No Tendon: No Muscle: No Joint: No Bone: No] [N/A:N/A] Epithelialization: [3:None] [N/A:N/A] Periwound Skin  Texture: [3:Excoriation: No Induration: No Callus: No Crepitus: No] [N/A:N/A] Rash: No Scarring: No Periwound Skin Moisture: Maceration: No N/A N/A Dry/Scaly: No Periwound Skin Color: Hemosiderin Staining: Yes N/A N/A Atrophie Blanche: No Cyanosis: No Ecchymosis: No Erythema: No Mottled: No Pallor: No Rubor: No Temperature: No Abnormality N/A N/A Tenderness on Palpation: Yes N/A N/A Wound Preparation: Ulcer Cleansing: N/A N/A Rinsed/Irrigated with Saline Topical Anesthetic Applied: Other: lidocaine 4% Treatment Notes Electronic Signature(s) Signed: 09/13/2017 5:09:29 PM By: Gretta Cool, BSN, RN, CWS, Kim RN, BSN Entered By: Gretta Cool, BSN, RN, CWS, Kim on 09/12/2017 08:24:04 Erin Sons. ([id number]) -------------------------------------------------------------------------------- [employer] Details Patient Name: JERYN, BERTONI. Date of Service: 09/12/2017 8:00 AM Medical Record Number: [id number] Patient Account Number: [id number] Date of Birth/Sex: Dec 20, 1925 (84 y.o. Female) Treating RN: Cornell Barman Primary Care Danika Kluender: Cranford Mon, Delfino Lovett Other Clinician: Referring Nakiah Osgood: Cranford Mon, RICHARD Treating Ellysia Char/Extender: Cathie Olden in Treatment: 1 Active Inactive ` Abuse / Safety / Falls / Self Care Management Nursing Diagnoses: Potential for falls Goals: Patient will remain injury free related to falls Date Initiated: 09/05/2017 Target Resolution Date: 11/18/2017 Goal Status: Active Interventions: Assess fall risk on admission and as needed Notes: ` Orientation to the Wound Care Program Nursing Diagnoses: Knowledge deficit related to the wound healing center program Goals: Patient/caregiver will verbalize understanding of the [place] Date Initiated: 09/05/2017 Target Resolution Date: 11/18/2017 Goal Status: Active Interventions: Provide education on orientation to the wound center Notes: ` Wound/Skin  Impairment Nursing Diagnoses: Impaired tissue integrity Goals: Ulcer/skin breakdown will heal within 14 weeks Date Initiated: 09/05/2017 Target Resolution Date: 11/18/2017 Goal Status: Active Interventions: Lingenfelter, Nakiesha M. ([id number]) Assess patient/caregiver ability to obtain necessary supplies Assess patient/caregiver ability to perform ulcer/skin care regimen upon admission and as needed Assess ulceration(s) every visit Notes: Electronic Signature(s) Signed: 09/13/2017 5:09:29 PM By: Gretta Cool, BSN, RN, CWS, Kim RN, BSN Entered By: Gretta Cool, BSN, RN, CWS, Kim on 09/12/2017 08:23:55 Galla, Ofilia Neas. ([id number]) -------------------------------------------------------------------------------- Pain Assessment Details Patient Name: MARIJOSE, CURINGTON. Date of Service: 09/12/2017 8:00 AM Medical Record Number: [id number] Patient Account Number: [id number] Date of Birth/Sex: 01-27-1926 (84 y.o. Female) Treating RN: Cornell Barman Primary Care Adron Geisel: Cranford Mon, Delfino Lovett Other Clinician: Referring London Tarnowski: Cranford Mon, RICHARD Treating Jaykub Mackins/Extender: Cathie Olden in Treatment: 1 Active Problems Location of Pain Severity and Description of Pain Patient Has Paino No Site Locations With Dressing Change: No Pain Management and Medication Current Pain Management: Goals for Pain Management Topical or injectable lidocaine is offered to patient for acute pain when surgical debridement is performed.  If needed, Patient is instructed to use over the counter pain medication for the following 24-48 hours after debridement. Wound care MDs do not prescribed pain medications. Patient has chronic pain or uncontrolled pain. Patient has been instructed to make an appointment with their Primary Care Physician for pain management. Electronic Signature(s) Signed: 09/13/2017 5:09:29 PM By: Gretta Cool, BSN, RN, CWS, Kim RN, BSN Entered By: Gretta Cool, BSN, RN, CWS, Kim on 09/12/2017 08:11:16 Butkiewicz, Ofilia Neas.  ([id number]) -------------------------------------------------------------------------------- Patient/Caregiver Education Details Patient Name: GLENDORIS, NODARSE. Date of Service: 09/12/2017 8:00 AM Medical Record Number: [id number] Patient Account Number: [id number] Date of Birth/Gender: 26-Dec-1925 (84 y.o. Female) Treating RN: Cornell Barman Primary Care Physician: Cranford Mon, Delfino Lovett Other Clinician: Referring Physician: Wilhemena Durie Treating Physician/Extender: Cathie Olden in Treatment: 1 Education Assessment Education Provided To: Patient Education Topics Provided Wound/Skin Impairment: Handouts: Caring for Your Ulcer, Other: continue wound care as prescribed Methods: Demonstration Responses: State content correctly Electronic Signature(s) Signed: 09/13/2017 5:09:29 PM By: Gretta Cool, BSN, RN, CWS, Kim RN, BSN Entered By: Gretta Cool, BSN, RN, CWS, Kim on 09/12/2017 08:26:46 Corter, Ofilia Neas. ([id number]) -------------------------------------------------------------------------------- Wound Assessment Details Patient Name: BELYNDA, PAGADUAN. Date of Service: 09/12/2017 8:00 AM Medical Record Number: [id number] Patient Account Number: [id number] Date of Birth/Sex: Sep 29, 1925 (84 y.o. Female) Treating RN: Cornell Barman Primary Care Nerissa Constantin: Cranford Mon, Delfino Lovett Other Clinician: Referring Vernelle Wisner: Cranford Mon, RICHARD Treating Guy Seese/Extender: Cathie Olden in Treatment: 1 Wound Status Wound Number: 3 Primary Diabetic Wound/Ulcer of the Lower Extremity Etiology: Wound Location: Right Upper Leg - Anterior Secondary Cyst Wounding Event: Bump Etiology: Date Acquired: 12/19/2016 Wound Status: Open Weeks Of Treatment: 1 Comorbid Cataracts, Glaucoma, Anemia, Hypertension, Clustered Wound: No History: Type II Diabetes, Osteoarthritis Photos Photo Uploaded By: Gretta Cool, BSN, RN, CWS, Kim on 09/12/2017 11:47:16 Wound Measurements Length: (cm) 0.9 Width: (cm) 0.9 Depth: (cm)  0.6 Area: (cm) 0.636 Volume: (cm) 0.382 % Reduction in Area: -15.6% % Reduction in Volume: -594.5% Epithelialization: None Tunneling: No Undermining: No Wound Description Classification: Grade 2 Wound Margin: Well defined, not attached Exudate Amount: Medium Exudate Type: Serous Exudate Color: amber Foul Odor After Cleansing: No Slough/Fibrino No Wound Bed Granulation Amount: None Present (0%) Exposed Structure Necrotic Amount: None Present (0%) Fascia Exposed: No Fat Layer (Subcutaneous Tissue) Exposed: Yes Tendon Exposed: No Muscle Exposed: No Joint Exposed: No Bone Exposed: No Periwound Skin Texture Texture Color No Abnormalities Noted: No No Abnormalities Noted: No Kanner, Alyn M. ([id number]) Callus: No Atrophie Blanche: No Crepitus: No Cyanosis: No Excoriation: No Ecchymosis: No Induration: No Erythema: No Rash: No Hemosiderin Staining: Yes Scarring: No Mottled: No Pallor: No Moisture Rubor: No No Abnormalities Noted: No Dry / Scaly: No Temperature / Pain Maceration: No Temperature: No Abnormality Tenderness on Palpation: Yes Wound Preparation Ulcer Cleansing: Rinsed/Irrigated with Saline Topical Anesthetic Applied: Other: lidocaine 4%, Treatment Notes Wound #3 (Right, Anterior Upper Leg) 1. Cleansed with: Clean wound with Normal Saline 4. Dressing Applied: Prisma Ag 5. Secondary [place] Signature(s) Signed: 09/13/2017 5:09:29 PM By: Gretta Cool, BSN, RN, CWS, Kim RN, BSN Entered By: Gretta Cool, BSN, RN, CWS, Kim on 09/12/2017 08:19:31 Tackett, Ofilia Neas. ([id number]) -------------------------------------------------------------------------------- Vitals Details Patient Name: ARISBEL, MAIONE. Date of Service: 09/12/2017 8:00 AM Medical Record Number: [id number] Patient Account Number: [id number] Date of Birth/Sex: March 08, 1926 (84 y.o. Female) Treating RN: Cornell Barman Primary Care Kajuan Guyton: Cranford Mon, Delfino Lovett Other  Clinician: Referring Sayvon Arterberry: Cranford Mon, RICHARD Treating Eric Nees/Extender: Cathie Olden in Treatment: 1 Vital Signs Time Taken: 08:11 Temperature (F):  98.2 Height (in): 63 Pulse (bpm): 72 Weight (lbs): 145 Respiratory Rate (breaths/min): 16 Body Mass Index (BMI): 25.7 Blood Pressure (mmHg): 182/72 Reference Range: 80 - 120 mg / dl Notes Patient states she has not taken her blood pressure medication this morning. Patient instructed to take medication as soon as she gets home. Electronic Signature(s) Signed: 09/13/2017 5:09:29 PM By: Gretta Cool, BSN, RN, CWS, Kim RN, BSN Entered By: Gretta Cool, BSN, RN, CWS, Kim on 09/12/2017 45:62:56

## 2017-09-18 DIAGNOSIS — Z96641 Presence of right artificial hip joint: Secondary | ICD-10-CM | POA: Diagnosis not present

## 2017-09-18 DIAGNOSIS — I10 Essential (primary) hypertension: Secondary | ICD-10-CM | POA: Diagnosis not present

## 2017-09-18 DIAGNOSIS — E11622 Type 2 diabetes mellitus with other skin ulcer: Secondary | ICD-10-CM | POA: Diagnosis not present

## 2017-09-18 DIAGNOSIS — Z791 Long term (current) use of non-steroidal anti-inflammatories (NSAID): Secondary | ICD-10-CM | POA: Diagnosis not present

## 2017-09-18 DIAGNOSIS — Z794 Long term (current) use of insulin: Secondary | ICD-10-CM | POA: Diagnosis not present

## 2017-09-18 DIAGNOSIS — L97112 Non-pressure chronic ulcer of right thigh with fat layer exposed: Secondary | ICD-10-CM | POA: Diagnosis not present

## 2017-09-18 DIAGNOSIS — Z7982 Long term (current) use of aspirin: Secondary | ICD-10-CM | POA: Diagnosis not present

## 2017-09-19 ENCOUNTER — Encounter: Payer: Medicare Other | Attending: Physician Assistant | Admitting: Physician Assistant

## 2017-09-19 DIAGNOSIS — L97112 Non-pressure chronic ulcer of right thigh with fat layer exposed: Secondary | ICD-10-CM | POA: Insufficient documentation

## 2017-09-19 DIAGNOSIS — H409 Unspecified glaucoma: Secondary | ICD-10-CM | POA: Insufficient documentation

## 2017-09-19 DIAGNOSIS — Z8249 Family history of ischemic heart disease and other diseases of the circulatory system: Secondary | ICD-10-CM | POA: Insufficient documentation

## 2017-09-19 DIAGNOSIS — E1121 Type 2 diabetes mellitus with diabetic nephropathy: Secondary | ICD-10-CM | POA: Insufficient documentation

## 2017-09-19 DIAGNOSIS — Z96641 Presence of right artificial hip joint: Secondary | ICD-10-CM | POA: Insufficient documentation

## 2017-09-19 DIAGNOSIS — Z951 Presence of aortocoronary bypass graft: Secondary | ICD-10-CM | POA: Diagnosis not present

## 2017-09-19 DIAGNOSIS — Z882 Allergy status to sulfonamides status: Secondary | ICD-10-CM | POA: Diagnosis not present

## 2017-09-19 DIAGNOSIS — E11622 Type 2 diabetes mellitus with other skin ulcer: Secondary | ICD-10-CM | POA: Diagnosis not present

## 2017-09-19 DIAGNOSIS — Z794 Long term (current) use of insulin: Secondary | ICD-10-CM | POA: Insufficient documentation

## 2017-09-19 DIAGNOSIS — I10 Essential (primary) hypertension: Secondary | ICD-10-CM | POA: Insufficient documentation

## 2017-09-19 DIAGNOSIS — M1990 Unspecified osteoarthritis, unspecified site: Secondary | ICD-10-CM | POA: Insufficient documentation

## 2017-09-20 NOTE — Progress Notes (Signed)
[name], [name] ([id number]) Visit Report for 09/19/2017 Chief Complaint Document Details Patient Name: Gabriela [name], [name] Date of Service: 09/19/2017 8:00 AM Medical Record Number: [id number] Patient Account Number: [id number] Date of Birth/Sex: Dec 29, 1925 (84 y.o. Female) Treating RN: Montey Hora Primary Care Provider: Cranford Mon, Delfino Lovett Other Clinician: Referring Provider: Wilhemena Durie Treating Provider/Extender: Melburn Hake, HOYT Weeks in Treatment: 2 Information Obtained from: Patient Chief Complaint She presents in follow up for a right anterior thigh wound/abscess Electronic Signature(s) Signed: 09/19/2017 1:41:25 PM By: Worthy Keeler PA-C Entered By: Worthy Keeler on 09/19/2017 08:23:35 Sellinger, Nyemah M. ([id number]) -------------------------------------------------------------------------------- HPI Details Patient Name: Gabriela, [name]. Date of Service: 09/19/2017 8:00 AM Medical Record Number: [id number] Patient Account Number: [id number] Date of Birth/Sex: January 18, 1926 (84 y.o. Female) Treating RN: Montey Hora Primary Care Provider: Cranford Mon, Delfino Lovett Other Clinician: Referring Provider: Wilhemena Durie Treating Provider/Extender: Melburn Hake, HOYT Weeks in Treatment: 2 History of Present Illness HPI Description: 84 year old female with right thigh abscess and ulceration of the right thigh noted by her orthopedic surgeon Dr. Kurtis Bushman. The patient has had right total hip arthroplasty with ostial lysis done recently in June 2017 and x-ray done postoperatively looked good. Patient was previously treated with Keflex and Septra. reviewing the orthopedic notes Dr. Sabra Heck had done a IandD of the abscess somewhere in June and at some stage had cauterized hyperbaric granulation tissue which continues to have drainage from the wound. 07/12/17; this is a patient who was seen one time by Dr. Con Memos on 13/04/2017. She did not return to our clinic. She was dressed at  that point with [facility]. I think she has been followed by orthopedics A Dr Harlow Mares and Dr. Sabra Heck of orthopedics for this area since then. He has been using peroxide to the area. The history here is a bit difficult to follow. It sounds as though she had an abscess develop roughly 6 months ago. There was no trauma. Apparently she had an IandD done but I don't see any culture results. X-rays have been done that show her previous total hip replacement to be in good position. Her total hip replacement was done 20 years ago per the patient and she had not had any problems in the interim. She has not had any pain in the area. She has no systemic symptoms including fever or chills. She is a type II diabetic on oral agents and Levemir 09/05/17 patient presents concerning her right thigh separation which has for the most part on inspection today closed over with epithelium although there is a small problematic for her. Nonetheless when she last saw Dr. Dellia Nims on July 12, 2017 he didn't do an incision and drainage and happened to mention that she may need additional testing depending on how things proceeded. However she did not come back until just now when I'm seeing her for reevaluation today. With that being said she does not seem to have any severe pain. She does continue to have drainage from the site and it has never really fully cleared and closed out as would be appropriate for a wound of this nature. Some of this may be due to the fact that she is not having appropriate follow-up and so we are not able to truly have this granulated appropriately. With that being said it does seem that she may have something more deeply draining and causing problems at this point. The good news is she's not having as much pain as previous. Patient is seen along  with her granddaughter in the office at this point in time. The issue that seems to be underlying this wound was explained to both patient as well as  her granddaughter No fevers, chills, nausea, or vomiting noted at this time. Again this sounds to have started initially has managed by Dr. Sabra Heck according to patient as an abscess that unfortunately has had a very difficult time healing. After reviewing the notes here further we may need to consider an MRI depending on how she is progressing at the next follow-up. We will see how things are doing. 09/19/17 on evaluation today patient appears to be doing excellent in regard to her right thigh ulcer. She has been tolerating the dressing changes without complication she does seem to be granulating in from the base and there is no deeper tunneling at this point as there was after I initially open this area up. Overall this seems to be doing very well and is feeling much better than it did last time around I do believe. Fortunately she has no significant discomfort in fact she has no pain at all except one we are probing around in the base of the wound. We are using Prisma currently. Electronic Signature(s) Signed: 09/19/2017 1:41:25 PM By: Worthy Keeler PA-C Entered By: Worthy Keeler on 09/19/2017 08:30:32 Labrake, Heela M. ([id number]) -------------------------------------------------------------------------------- Physical Exam Details Patient Name: Gabriela, [name]. Date of Service: 09/19/2017 8:00 AM Medical Record Number: [id number] Patient Account Number: [id number] Date of Birth/Sex: May 10, 1926 (84 y.o. Female) Treating RN: Montey Hora Primary Care Provider: Cranford Mon, Delfino Lovett Other Clinician: Referring Provider: Cranford Mon, RICHARD Treating Provider/Extender: Melburn Hake, HOYT Weeks in Treatment: 2 Constitutional Well-nourished and well-hydrated in no acute distress. Respiratory normal breathing without difficulty. clear to auscultation bilaterally. Cardiovascular regular rate and rhythm with normal S1, S2. Psychiatric this patient is able to make decisions and demonstrates good  insight into disease process. Alert and Oriented x 3. pleasant and cooperative. Notes Patient has excellent granulation in the base of the wound and there is no need at this point in time for sharp debridement. There is no evidence of infection. Electronic Signature(s) Signed: 09/19/2017 1:41:25 PM By: Worthy Keeler PA-C Entered By: Worthy Keeler on 09/19/2017 08:31:45 Feely, Kanyia M. ([id number]) -------------------------------------------------------------------------------- Physician Orders Details Patient Name: TATIYANA, FOUCHER. Date of Service: 09/19/2017 8:00 AM Medical Record Number: [id number] Patient Account Number: [id number] Date of Birth/Sex: 1925/11/22 (84 y.o. 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 o May Shower, gently pat wound dry prior to applying new dressing. Primary Wound Dressing Wound #3 Right,Anterior Upper Leg o Prisma Ag Secondary Dressing Wound #3 Right,Anterior Upper Leg o [place] Dressing Change Frequency Wound #3 Right,Anterior Upper Leg o Change dressing every other day. Follow-up Appointments Wound #3 Right,Anterior Upper Leg o Return Appointment in 2 weeks. Additional Orders / Instructions Wound #3 Right,Anterior Upper Leg o Increase protein intake. Home Health Wound #3 [place] Visits - [facility] please call [facility] and let us  know which Home  [place] is visiting patient so signed orders can be faxed - [place] Nurse may visit PRN to address patientos wound care needs. o FACE TO FACE ENCOUNTER: MEDICARE and MEDICAID PATIENTS: I certify that this patient is under my care and that I had a face-to-face encounter that meets the physician face-to-face encounter requirements with this patient on this date. The encounter with the patient was in whole or in part for the following MEDICAL CONDITION: (primary reason for [place]) MEDICAL NECESSITY: I certify, that based on my findings, Leyland, Jezebelle M. ([id number]) NURSING services are a medically necessary home health service. HOME BOUND STATUS: I certify that my clinical findings support that this patient is homebound (i.e., Due to illness or injury, pt requires aid of supportive devices such as crutches, cane, wheelchairs, walkers, the use of special transportation or the assistance of another person to leave their place of residence. There is a normal inability to leave the home and doing so requires considerable and taxing effort. Other absences are for medical reasons / religious services and are infrequent or of short duration when for other reasons). o If current dressing causes regression in wound condition, may D/C ordered dressing product/s and apply Normal Saline Moist Dressing daily until next [place] / Other MD appointment. [place] of regression in wound condition at [phone]. o Please direct any NON-WOUND related issues/requests for orders to patient's Primary Care Physician Electronic Signature(s) Signed: 09/19/2017 1:41:25 PM By: Worthy Keeler PA-C Signed: 09/19/2017 4:43:13 PM By: Montey Hora Entered By: Montey Hora on 09/19/2017 08:28:16 Grandt, Aspynn M. ([id number]) -------------------------------------------------------------------------------- Problem List Details Patient  Name: MEGA, KINKADE. Date of Service: 09/19/2017 8:00 AM Medical Record Number: [id number] Patient Account Number: [id number] Date of Birth/Sex: 1926/04/05 (84 y.o. 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 ([id number]) -------------------------------------------------------------------------------- Progress Note Details Patient Name: KILEY, SOLIMINE. Date of Service: 09/19/2017 8:00 AM Medical Record Number: [id number] Patient Account Number: [id number] Date of Birth/Sex: 06/08/1926 (84 y.o. Female) Treating RN: Montey Hora Primary Care Provider: Cranford Mon, Delfino Lovett Other Clinician: Referring Provider: Wilhemena Durie Treating Provider/Extender: Melburn Hake, HOYT Weeks in Treatment: 2 Subjective Chief Complaint Information obtained from Patient She presents in follow up for a right anterior thigh wound/abscess History of Present Illness (HPI) 84 year old female with right thigh abscess and ulceration of the right thigh noted by her orthopedic surgeon Dr. Kurtis Bushman. The patient has had right total hip arthroplasty with ostial lysis done recently in June 2017 and x-ray done postoperatively looked good. Patient was previously treated with Keflex and Septra. reviewing the orthopedic notes  Dr. Sabra Heck had done a IandD of the abscess somewhere in June and at some stage had cauterized hyperbaric granulation tissue which continues to have drainage from the wound. 07/12/17; this is a patient who was seen one time by Dr. Con Memos on 13/04/2017. She did not return to our clinic. She was  dressed at that point with [facility]. I think she has been followed by orthopedics A Dr Harlow Mares and Dr. Sabra Heck of orthopedics for this area since then. He has been using peroxide to the area. The history here is a bit difficult to follow. It sounds as though she had an abscess develop roughly 6 months ago. There was no trauma. Apparently she had an IandD done but I don't see any culture results. X-rays have been done that show her previous total hip replacement to be in good position. Her total hip replacement was done 20 years ago per the patient and she had not had any problems in the interim. She has not had any pain in the area. She has no systemic symptoms including fever or chills. She is a type II diabetic on oral agents and Levemir 09/05/17 patient presents concerning her right thigh separation which has for the most part on inspection today closed over with epithelium although there is a small problematic for her. Nonetheless when she last saw Dr. Dellia Nims on July 12, 2017 he didn't do an incision and drainage and happened to mention that she may need additional testing depending on how things proceeded. However she did not come back until just now when I'm seeing her for reevaluation today. With that being said she does not seem to have any severe pain. She does continue to have drainage from the site and it has never really fully cleared and closed out as would be appropriate for a wound of this nature. Some of this may be due to the fact that she is not having appropriate follow-up and so we are not able to truly have this granulated appropriately. With that being said it does seem that she may  have something more deeply draining and causing problems at this point. The good news is she's not having as much pain as previous. Patient is seen along with her granddaughter in the office at this point in time. The issue that seems to be underlying this wound was explained to both patient as well as her granddaughter No fevers, chills, nausea, or vomiting noted at this time. Again this sounds to have started initially has managed by Dr. Sabra Heck according to patient as an abscess that unfortunately has had a very difficult time healing. After reviewing the notes here further we may need to consider an MRI depending on how she is progressing at the next follow-up. We will see how things are doing. 09/19/17 on evaluation today patient appears to be doing excellent in regard to her right thigh ulcer. She has been tolerating the dressing changes without complication she does seem to be granulating in from the base and there is no deeper tunneling at this point as there was after I initially open this area up. Overall this seems to be doing very well and is feeling much better than it did last time around I do believe. Fortunately she has no significant discomfort in fact she has no pain at all except one we are probing around in the base of the wound. We are using Prisma currently. Patient History Information obtained from Patient. Family History Cancer - Child, Diabetes - Child, Heart Disease - Child, Heady, Tameah M. ([id number]) No family history of Hypertension, Kidney Disease, Lung Disease, Seizures, Stroke, Thyroid Problems, Tuberculosis. Social History Never smoker, Marital Status - Widowed, Alcohol Use - Never, Drug Use - No History, Caffeine Use - Daily. Medical And Surgical History Notes Constitutional Symptoms (General Health) High  Blood Pressure, Diabetes, Aspirin for heart, fluid pil Cardiovascular Heart Bypass Review of Systems (ROS) Constitutional Symptoms (General Health) Denies  complaints or symptoms of Fever, Chills. Respiratory The patient has no complaints or symptoms. Cardiovascular The patient has no complaints or symptoms. Psychiatric The patient has no complaints or symptoms. Objective Constitutional Well-nourished and well-hydrated in no acute distress. Vitals Time Taken: 8:17 AM, Height: 63 in, Weight: 145 lbs, BMI: 25.7, Temperature: 98.3 F, Pulse: 57 bpm, Respiratory Rate: 18 breaths/min, Blood Pressure: 145/85 mmHg. Respiratory normal breathing without difficulty. clear to auscultation bilaterally. Cardiovascular regular rate and rhythm with normal S1, S2. Psychiatric this patient is able to make decisions and demonstrates good insight into disease process. Alert and Oriented x 3. pleasant and cooperative. General Notes: Patient has excellent granulation in the base of the wound and there is no need at this point in time for sharp debridement. There is no evidence of infection. Integumentary (Hair, Skin) Wound #3 status is Open. Original cause of wound was Bump. The wound is located on the Right,Anterior Upper Leg. The wound measures 0.5cm length x 0.7cm width x 0.4cm depth; 0.275cm^2 area and 0.11cm^3 volume. There is Fat Layer (Subcutaneous Tissue) Exposed exposed. There is no tunneling or undermining noted. There is a medium amount of serous drainage noted. The wound margin is well defined and not attached to the wound base. There is large (67-100%) red granulation within the wound bed. There is no necrotic tissue within the wound bed. The periwound skin appearance exhibited: Hemosiderin Staining. The periwound skin appearance did not exhibit: Callus, Crepitus, Excoriation, Induration, Marley, Syla M. ([id number]) Rash, Scarring, Dry/Scaly, Maceration, Atrophie Blanche, Cyanosis, Ecchymosis, Mottled, Pallor, Rubor, Erythema. Periwound temperature was noted as No Abnormality. The periwound has tenderness on palpation. Assessment Active  Problems ICD-10 E11.622 - Type 2 diabetes mellitus with other skin ulcer L97.112 - Non-pressure chronic ulcer of right thigh with fat layer exposed I10 - Essential (primary) hypertension E11.21 - Type 2 diabetes mellitus with diabetic nephropathy Plan Wound Cleansing: Wound #3 Right,Anterior Upper Leg: Clean wound with Normal Saline. May Shower, gently pat wound dry prior to applying new dressing. Primary Wound Dressing: Wound #3 Right,Anterior Upper Leg: Prisma Ag Secondary Dressing: Wound #3 Right,Anterior Upper Leg: [place] Dressing Change Frequency: Wound #3 Right,Anterior Upper Leg: Change dressing every other day. Follow-up Appointments: Wound #3 Right,Anterior Upper Leg: Return Appointment in 2 weeks. Additional Orders / Instructions: Wound #3 Right,Anterior Upper Leg: Increase protein intake. Home Health: Wound #3 Right,Anterior Upper Leg: Continue Home Health Visits - [place] please call [facility] and let us know which [place] is visiting patient so signed orders can be faxed - [place] Nurse may visit PRN to address patient s wound care needs. FACE TO FACE ENCOUNTER: MEDICARE and MEDICAID PATIENTS: I certify that this patient is under my care and that I had a face-to-face encounter that meets the physician face-to-face encounter requirements with this patient on this date. The encounter with the patient was in whole or in part for the following MEDICAL CONDITION: (primary reason for [place]) MEDICAL NECESSITY: I certify, that based on my findings, NURSING services are a medically necessary home health service. HOME BOUND STATUS: I certify that my clinical findings support that this patient is homebound (i.e., Due to illness or injury, pt requires aid of supportive devices such as crutches, cane, wheelchairs, walkers, the use of special transportation or the assistance of another person to leave their place of  residence. There is a normal inability to leave the home and doing so requires considerable and taxing effort. Other absences are for medical reasons / religious services and are infrequent or of short duration when for other reasons). If current dressing causes regression in wound condition, may D/C ordered dressing product/s and apply Normal Saline Moist Dressing daily until next [place] / Other MD appointment. [facility] of regression in [place], Hadlyn M. ([id number]) wound condition at [phone]. Please direct any NON-WOUND related issues/requests for orders to patient's Primary Care Physician I am going to recommend that we continue with the Current wound care measures utilizing the Prisma we will see were things stand in 2 weeks time. Fortunately this seems to be healing well. Please see above for specific wound care orders. We will see patient for re-evaluation in 2 week(s) here in the clinic. If anything worsens or changes patient will contact our office for additional recommendations. Electronic Signature(s) Signed: 09/19/2017 1:41:25 PM By: Worthy Keeler PA-C Entered By: Worthy Keeler on 09/19/2017 08:32:03 Everett, Tomicka M. ([id number]) -------------------------------------------------------------------------------- ROS/PFSH Details Patient Name: AZELIE, NOGUERA. Date of Service: 09/19/2017 8:00 AM Medical Record Number: [id number] Patient Account Number: [id number] Date of Birth/Sex: 01/07/1926 (84 y.o. Female) Treating RN: Montey Hora Primary Care Provider: Cranford Mon, Delfino Lovett Other Clinician: Referring Provider: Cranford Mon, Delfino Lovett Treating Provider/Extender: Melburn Hake, HOYT Weeks in Treatment: 2 Information Obtained From Patient Wound History Do you currently have one or more open woundso Yes How many open wounds do you currently haveo 1 Approximately how long have you had your woundso 5 months How have you been treating your  wound(s) until nowo ointment Has your wound(s) ever healed and then re-openedo No Have you had any lab work done in the past montho No Have you tested positive for an antibiotic resistant organism (MRSA, VRE)o No Have you tested positive for osteomyelitis (bone infection)o No Have you had any tests for circulation on your legso No Constitutional Symptoms (General Health) Complaints and Symptoms: Negative for: Fever; Chills Medical History: Past Medical History Notes: High Blood Pressure, Diabetes, Aspirin for heart, fluid pil Eyes Medical History: Positive for: Cataracts - removed; Glaucoma Negative for: Optic Neuritis Ear/Nose/Mouth/Throat Medical History: Negative for: Chronic sinus problems/congestion; Middle ear problems Hematologic/Lymphatic Medical History: Positive for: Anemia Negative for: Hemophilia; Human Immunodeficiency Virus; Lymphedema; Sickle Cell Disease Respiratory Complaints and Symptoms: No Complaints or Symptoms Medical History: Negative for: Aspiration; Asthma; Chronic Obstructive Pulmonary Disease (COPD); Pneumothorax; Sleep Apnea; Tuberculosis Cardiovascular Morandi, Leesha M. ([id number]) Complaints and Symptoms: No Complaints or Symptoms Medical History: Positive for: Hypertension Negative for: Angina; Arrhythmia; Congestive Heart Failure; Coronary Artery Disease; Deep Vein Thrombosis; Hypotension; Myocardial Infarction; Peripheral Arterial Disease; Peripheral Venous Disease; Phlebitis; Vasculitis Past Medical History Notes: Heart Bypass Gastrointestinal Medical History: Negative for: Cirrhosis ; Colitis; Crohnos; Hepatitis A; Hepatitis C Endocrine Medical History: Positive for: Type II Diabetes Negative for: Type I Diabetes Time with diabetes: 51 Treated with: Insulin Blood sugar tested every day: No Genitourinary Medical History: Negative for: End Stage Renal Disease Immunological Medical History: Negative for: Lupus Erythematosus;  Raynaudos; Scleroderma Integumentary (Skin) Medical History: Negative for: History of Burn; History of pressure wounds Musculoskeletal Medical History: Positive for: Osteoarthritis Negative for: Gout; Rheumatoid Arthritis; Osteomyelitis Neurologic Medical History: Negative for: Dementia; Neuropathy; Quadriplegia; Paraplegia; Seizure Disorder Oncologic Medical History: Negative for: Received Chemotherapy; Received Radiation Psychiatric Complaints and Symptoms: No Complaints or Symptoms Sutliff, Sitlaly M. ([id number]) Medical History: Negative for: Anorexia/bulimia; Confinement Anxiety HBO Extended  History Items Eyes: Eyes: Cataracts Glaucoma Immunizations Pneumococcal Vaccine: Received Pneumococcal Vaccination: Yes Implantable Devices Family and Social History Cancer: Yes - Child; Diabetes: Yes - Child; Heart Disease: Yes - Child; Hypertension: No; Kidney Disease: No; Lung Disease: No; Seizures: No; Stroke: No; Thyroid Problems: No; Tuberculosis: No; Never smoker; Marital Status - Widowed; Alcohol Use: Never; Drug Use: No History; Caffeine Use: Daily; Advanced Directives: No; Patient does not want information on Advanced Directives; Living Will: No; Medical Power of Attorney: No Physician Affirmation I have reviewed and agree with the above information. Electronic Signature(s) Signed: 09/19/2017 1:41:25 PM By: Worthy Keeler PA-C Signed: 09/19/2017 4:43:13 PM By: Montey Hora Entered By: Worthy Keeler on 09/19/2017 08:30:55 Posthumus, Maimuna M. ([id number]) -------------------------------------------------------------------------------- SuperBill Details Patient Name: BREN, BORYS. Date of Service: 09/19/2017 Medical Record Number: [id number] Patient Account Number: [id number] Date of Birth/Sex: 1925-12-20 (84 y.o. 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 Facility Procedures CPT4 Code: 28003491 Description: [phone] - WOUND CARE VISIT-LEV 2 EST PT Modifier: Quantity: 1 Physician Procedures CPT4 Code: [id number] Description: 01655 - WC PHYS LEVEL 3 - EST PT ICD-10 Diagnosis Description E11.622 Type 2 diabetes mellitus with other skin ulcer L97.112 Non-pressure chronic ulcer of right thigh with fat layer e I10 Essential (primary) hypertension E11.21 Type 2  diabetes mellitus with diabetic nephropathy Modifier: xposed Quantity: 1 Electronic Signature(s) Signed: 09/19/2017 8:44:06 AM By: Montey Hora Signed: 09/19/2017 1:41:25 PM By: Worthy Keeler PA-C Entered By: Montey Hora on 09/19/2017 08:44:06

## 2017-09-20 NOTE — Progress Notes (Signed)
Nedved, Jordin M. ([id number]) Visit Report for 09/19/2017 Arrival Information Details Patient Name: Gabriela [name], [name] Date of Service: 09/19/2017 8:00 AM Medical Record Number: [id number] Patient Account Number: [id number] Date of Birth/Sex: 08-10-1926 (84 y.o. Female) Treating RN: Montey Hora Primary Care Shlome Baldree: Cranford Mon, Delfino Lovett Other Clinician: Referring Aurelie Dicenzo: Cranford Mon, Delfino Lovett Treating Yariana Hoaglund/Extender: Worthy Keeler Weeks in Treatment: 2 Visit Information History Since Last Visit Added or deleted any medications: No Patient Arrived: Walker Any new allergies or adverse reactions: No Arrival Time: 08:08 Had a fall or experienced change in No Accompanied By: self activities of daily living that may affect Transfer Assistance: None risk of falls: Patient Identification Verified: Yes Signs or symptoms of abuse/neglect since last visito No Secondary Verification Process Yes Hospitalized since last visit: No Completed: Has Dressing in Place as Prescribed: Yes Patient Has Alerts: Yes Pain Present Now: No Patient Alerts: DMII ABI [place] BILATERAL >220 Electronic Signature(s) Signed: 09/19/2017 4:43:13 PM By: Montey Hora Entered By: Montey Hora on 09/19/2017 08:16:14 Gabriela [name], [name] ([id number]) -------------------------------------------------------------------------------- Clinic Level of Care Assessment Details Patient Name: [name] Date of Service: 09/19/2017 8:00 AM Medical Record Number: [id number] Patient Account Number: [id number] Date of Birth/Sex: 11-14-1925 (84 y.o. Female) Treating RN: Montey Hora Primary Care Lean Jaeger: Cranford Mon, Delfino Lovett Other Clinician: Referring Keerthana Vanrossum: Cranford Mon, Delfino Lovett Treating Charlott Calvario/Extender: Melburn Hake, HOYT Weeks in Treatment: 2 Clinic Level of Care Assessment Items TOOL 4 Quantity Score []  - Use when only an EandM is performed on FOLLOW-UP visit 0 ASSESSMENTS - Nursing Assessment / Reassessment X -  Reassessment of Co-morbidities (includes updates in patient status) 1 10 X- 1 5 Reassessment of Adherence to Treatment Plan ASSESSMENTS - Wound and Skin Assessment / Reassessment X - Simple Wound Assessment / Reassessment - one wound 1 5 []  - 0 Complex Wound Assessment / Reassessment - multiple wounds []  - 0 Dermatologic / Skin Assessment (not related to wound area) ASSESSMENTS - Focused Assessment []  - Circumferential Edema Measurements - multi extremities 0 []  - 0 Nutritional Assessment / Counseling / Intervention []  - 0 Lower Extremity Assessment (monofilament, tuning fork, pulses) []  - 0 Peripheral Arterial Disease Assessment (using hand held doppler) ASSESSMENTS - Ostomy and/or Continence Assessment and Care []  - Incontinence Assessment and Management 0 []  - 0 Ostomy Care Assessment and Management (repouching, etc.) PROCESS - Coordination of Care X - Simple Patient / Family Education for ongoing care 1 15 []  - 0 Complex (extensive) Patient / Family Education for ongoing care []  - 0 Staff obtains Programmer, systems, Records, Test Results / Process Orders []  - 0 Staff telephones HHA, Nursing Homes / Clarify orders / etc []  - 0 Routine Transfer to another Facility (non-emergent condition) []  - 0 Routine Hospital Admission (non-emergent condition) []  - 0 New Admissions / Biomedical engineer / Ordering NPWT, Apligraf, etc. []  - 0 Emergency Hospital Admission (emergent condition) X- 1 10 Simple Discharge Coordination Gabriela [name], [name] ([id number]) []  - 0 Complex (extensive) Discharge Coordination PROCESS - Special Needs []  - Pediatric / Minor Patient Management 0 []  - 0 Isolation Patient Management []  - 0 Hearing / Language / Visual special needs []  - 0 Assessment of Community assistance (transportation, D/C planning, etc.) []  - 0 Additional assistance / Altered mentation []  - 0 Support Surface(s) Assessment (bed, cushion, seat, etc.) INTERVENTIONS - Wound Cleansing /  Measurement X - Simple Wound Cleansing - one wound 1 5 []  - 0 Complex Wound Cleansing - multiple wounds X- 1 5 Wound Imaging (photographs - any number  of wounds) []  - 0 Wound Tracing (instead of photographs) X- 1 5 Simple Wound Measurement - one wound []  - 0 Complex Wound Measurement - multiple wounds INTERVENTIONS - Wound Dressings X - Small Wound Dressing one or multiple wounds 1 10 []  - 0 Medium Wound Dressing one or multiple wounds []  - 0 Large Wound Dressing one or multiple wounds []  - 0 Application of Medications - topical []  - 0 Application of Medications - injection INTERVENTIONS - Miscellaneous []  - External ear exam 0 []  - 0 Specimen Collection (cultures, biopsies, blood, body fluids, etc.) []  - 0 Specimen(s) / Culture(s) sent or taken to Lab for analysis []  - 0 Patient Transfer (multiple staff / Civil Service fast streamer / Similar devices) []  - 0 Simple Staple / Suture removal (25 or less) []  - 0 Complex Staple / Suture removal (26 or more) []  - 0 Hypo / Hyperglycemic Management (close monitor of Blood Glucose) []  - 0 Ankle / Brachial Index (ABI) - do not check if billed separately X- 1 5 Vital Signs [name], [name] ([id number]) Has the patient been seen at the hospital within the last three years: Yes Total Score: 75 Level Of Care: New/Established - Level 2 Electronic Signature(s) Signed: 09/19/2017 4:43:13 PM By: Montey Hora Entered By: Montey Hora on 09/19/2017 08:43:59 [name], [name] ([id number]) -------------------------------------------------------------------------------- Encounter Discharge Information Details Patient Name: [name] Date of Service: 09/19/2017 8:00 AM Medical Record Number: [id number] Patient Account Number: [id number] Date of Birth/Sex: 04/23/1926 (84 y.o. Female) Treating RN: Montey Hora Primary Care Aadon Gorelik: Cranford Mon, Delfino Lovett Other Clinician: Referring Adriaan Maltese: Wilhemena Durie Treating Jakari Sada/Extender: Sharalyn Ink in Treatment: 2 Encounter Discharge Information Items Discharge Pain Level: 0 Discharge Condition: Stable Ambulatory Status: Walker Discharge Destination: Home Transportation: Private Auto Accompanied By: self Schedule Follow-up Appointment: Yes Medication Reconciliation completed and No provided to Patient/Care Jonnelle Lawniczak: Provided on Clinical Summary of Care: 09/19/2017 Form Type Recipient Paper Patient LB Electronic Signature(s) Signed: 09/19/2017 8:44:44 AM By: Montey Hora Entered By: Montey Hora on 09/19/2017 08:44:44 Gabriela [name], [name] ([id number]) -------------------------------------------------------------------------------- Lower Extremity Assessment Details Patient Name: ALOURA, MATSUOKA. Date of Service: 09/19/2017 8:00 AM Medical Record Number: [id number] Patient Account Number: [id number] Date of Birth/Sex: [birth year] (84 y.o. Female) Treating RN: Montey Hora Primary Care Fanta Wimberley: Cranford Mon, Delfino Lovett Other Clinician: Referring Randell Teare: Cranford Mon, Delfino Lovett Treating Manhattan Mccuen/Extender: Melburn Hake, HOYT Weeks in Treatment: 2 Vascular Assessment Pulses: Posterior Tibial Extremity colors, hair growth, and conditions: Hair Growth on Extremity: [Right:No] Temperature of Extremity: [Right:Warm] Electronic Signature(s) Signed: 09/19/2017 4:43:13 PM By: Montey Hora Entered By: Montey Hora on 09/19/2017 08:26:15 Gabriela [name], [name] ([id number]) -------------------------------------------------------------------------------- Multi Wound Chart Details Patient Name: RICK, WARNICK. Date of Service: 09/19/2017 8:00 AM Medical Record Number: [id number] Patient Account Number: [id number] Date of Birth/Sex: [birth year] (84 y.o. Female) Treating RN: Montey Hora Primary Care Shaye Lagace: Cranford Mon, Delfino Lovett Other Clinician: Referring Genetta Fiero: Cranford Mon, Delfino Lovett Treating Tametra Ahart/Extender: Melburn Hake, HOYT Weeks in Treatment: 2 Vital Signs Height(in):  63 Pulse(bpm): 16 Weight(lbs): 145 Blood Pressure(mmHg): 145/85 Body Mass Index(BMI): 26 Temperature(F): 98.3 Respiratory Rate 18 (breaths/min): Photos: [3:No Photos] [N/A:N/A] Wound Location: [3:Right Upper Leg - Anterior] [N/A:N/A] Wounding Event: [3:Bump] [N/A:N/A] Primary Etiology: [3:Diabetic Wound/Ulcer of the N/A Lower Extremity] Secondary Etiology: [3:Cyst] [N/A:N/A] Comorbid History: [3:Cataracts, Glaucoma, Anemia, N/A Hypertension, Type II Diabetes, Osteoarthritis] Date Acquired: [3:12/19/2016] [N/A:N/A] Weeks of Treatment: [3:2] [N/A:N/A] Wound Status: [3:Open] [N/A:N/A] Measurements L x W x D [3:0.5x0.7x0.4] [N/A:N/A] (cm) Area (cm) : [3:0.275] [N/A:N/A] Volume (cm) : [  3:0.11] [N/A:N/A] % Reduction in Area: [3:50.00%] [N/A:N/A] % Reduction in Volume: [3:-100.00%] [N/A:N/A] Classification: [3:Grade 2] [N/A:N/A] Exudate Amount: [3:Medium] [N/A:N/A] Exudate Type: [3:Serous] [N/A:N/A] Exudate Color: [3:amber] [N/A:N/A] Wound Margin: [3:Well defined, not attached] [N/A:N/A] Granulation Amount: [3:Large (67-100%)] [N/A:N/A] Granulation Quality: [3:Red] [N/A:N/A] Necrotic Amount: [3:None Present (0%)] [N/A:N/A] Exposed Structures: [3:Fat Layer (Subcutaneous Tissue) Exposed: Yes Fascia: No Tendon: No Muscle: No Joint: No Bone: No] [N/A:N/A] Epithelialization: [3:None] [N/A:N/A] Periwound Skin Texture: [3:Excoriation: No Induration: No Callus: No] [N/A:N/A] Crepitus: No Rash: No Scarring: No Periwound Skin Moisture: Maceration: No N/A N/A Dry/Scaly: No Periwound Skin Color: Hemosiderin Staining: Yes N/A N/A Atrophie Blanche: No Cyanosis: No Ecchymosis: No Erythema: No Mottled: No Pallor: No Rubor: No Temperature: No Abnormality N/A N/A Tenderness on Palpation: Yes N/A N/A Wound Preparation: Ulcer Cleansing: N/A N/A Rinsed/Irrigated with Saline Topical Anesthetic Applied: Other: lidocaine 4% Treatment Notes Electronic Signature(s) Signed: 09/19/2017  4:43:13 PM By: Montey Hora Entered By: Montey Hora on 09/19/2017 08:27:00 Gabriela [name], [name] ([id number]) -------------------------------------------------------------------------------- [place] Details Patient Name: MIRINDA, MONTE. Date of Service: 09/19/2017 8:00 AM Medical Record Number: [id number] Patient Account Number: [id number] Date of Birth/Sex: Mar 16, 1926 (84 y.o. Female) Treating RN: Montey Hora Primary Care Taysen Bushart: Cranford Mon, Delfino Lovett Other Clinician: Referring Eleanor Dimichele: Wilhemena Durie Treating Espen Bethel/Extender: Melburn Hake, HOYT Weeks in Treatment: 2 Active Inactive ` Abuse / Safety / Falls / Self Care Management Nursing Diagnoses: Potential for falls Goals: Patient will remain injury free related to falls Date Initiated: 09/05/2017 Target Resolution Date: 11/18/2017 Goal Status: Active Interventions: Assess fall risk on admission and as needed Notes: ` Orientation to the Wound Care Program Nursing Diagnoses: Knowledge deficit related to the wound healing center program Goals: Patient/caregiver will verbalize understanding of the [place] Date Initiated: 09/05/2017 Target Resolution Date: 11/18/2017 Goal Status: Active Interventions: Provide education on orientation to the wound center Notes: ` Wound/Skin Impairment Nursing Diagnoses: Impaired tissue integrity Goals: Ulcer/skin breakdown will heal within 14 weeks Date Initiated: 09/05/2017 Target Resolution Date: 11/18/2017 Goal Status: Active Interventions: Madonna, [name] ([id number]) Assess patient/caregiver ability to obtain necessary supplies Assess patient/caregiver ability to perform ulcer/skin care regimen upon admission and as needed Assess ulceration(s) every visit Notes: Electronic Signature(s) Signed: 09/19/2017 4:43:13 PM By: Montey Hora Entered By: Montey Hora on 09/19/2017 08:26:19 Gabriela [name], [name]  ([id number]) -------------------------------------------------------------------------------- Pain Assessment Details Patient Name: Gabriela [name], [name] Date of Service: 09/19/2017 8:00 AM Medical Record Number: [id number] Patient Account Number: [id number] Date of Birth/Sex: 07/13/1926 (84 y.o. Female) Treating RN: Montey Hora Primary Care Eryn Marandola: Cranford Mon, Delfino Lovett Other Clinician: Referring Malaina Mortellaro: Cranford Mon, Delfino Lovett Treating Vyctoria Dickman/Extender: Melburn Hake, HOYT Weeks in Treatment: 2 Active Problems Location of Pain Severity and Description of Pain Patient Has Paino No Site Locations With Dressing Change: Yes Pain Management and Medication Current Pain Management: Notes Topical or injectable lidocaine is offered to patient for acute pain when surgical debridement is performed. If needed, Patient is instructed to use over the counter pain medication for the following 24-48 hours after debridement. Wound care MDs do not prescribed pain medications. Patient has chronic pain or uncontrolled pain. Patient has been instructed to make an appointment with their Primary Care Physician for pain management. Electronic Signature(s) Signed: 09/19/2017 4:43:13 PM By: Montey Hora Entered By: Montey Hora on 09/19/2017 08:17:21 Gharibian, Zephyra M. ([id number]) -------------------------------------------------------------------------------- Patient/Caregiver Education Details Patient Name: Gabriela [name], [name] Date of Service: 09/19/2017 8:00 AM Medical Record Number: [id number] Patient Account Number: [id number] Date of Birth/Gender: October 12, 1925 (84 y.o. Female)  Treating RN: Montey Hora Primary Care Physician: Cranford Mon, Delfino Lovett Other Clinician: Referring Physician: Wilhemena Durie Treating Physician/Extender: Sharalyn Ink in Treatment: 2 Education Assessment Education Provided To: Patient Education Topics Provided Wound/Skin Impairment: Handouts: Other: please give HHRN  signed orders and ask her to call and let us know where to fax orders Methods: Explain/Verbal, Printed Responses: State content correctly Electronic Signature(s) Signed: 09/19/2017 4:43:13 PM By: Montey Hora Entered By: Montey Hora on 09/19/2017 08:45:19 Gabriela [name], [name] ([id number]) -------------------------------------------------------------------------------- Wound Assessment Details Patient Name: Gabriela [name], [name] Date of Service: 09/19/2017 8:00 AM Medical Record Number: [id number] Patient Account Number: [id number] Date of Birth/Sex: 03-17-1926 (84 y.o. Female) Treating RN: Montey Hora Primary Care Josue Kass: Cranford Mon, Delfino Lovett Other Clinician: Referring Franceska Strahm: Cranford Mon, Delfino Lovett Treating Kamya Watling/Extender: Melburn Hake, HOYT Weeks in Treatment: 2 Wound Status Wound Number: 3 Primary Diabetic Wound/Ulcer of the Lower Extremity Etiology: Wound Location: Right Upper Leg - Anterior Secondary Cyst Wounding Event: Bump Etiology: Date Acquired: 12/19/2016 Wound Status: Open Weeks Of Treatment: 2 Comorbid Cataracts, Glaucoma, Anemia, Hypertension, Clustered Wound: No History: Type II Diabetes, Osteoarthritis Photos Photo Uploaded By: Montey Hora on 09/19/2017 08:45:56 Wound Measurements Length: (cm) 0.5 Width: (cm) 0.7 Depth: (cm) 0.4 Area: (cm) 0.275 Volume: (cm) 0.11 % Reduction in Area: 50% % Reduction in Volume: -100% Epithelialization: None Tunneling: No Undermining: No Wound Description Classification: Grade 2 Foul O Wound Margin: Well defined, not attached Slough Exudate Amount: Medium Exudate Type: Serous Exudate Color: amber dor After Cleansing: No /Fibrino No Wound Bed Granulation Amount: Large (67-100%) Exposed Structure Granulation Quality: Red Fascia Exposed: No Necrotic Amount: None Present (0%) Fat Layer (Subcutaneous Tissue) Exposed: Yes Tendon Exposed: No Muscle Exposed: No Joint Exposed: No Bone Exposed: No Periwound Skin  Texture Shake, Alasia M. ([id number]) Texture Color No Abnormalities Noted: No No Abnormalities Noted: No Callus: No Atrophie Blanche: No Crepitus: No Cyanosis: No Excoriation: No Ecchymosis: No Induration: No Erythema: No Rash: No Hemosiderin Staining: Yes Scarring: No Mottled: No Pallor: No Moisture Rubor: No No Abnormalities Noted: No Dry / Scaly: No Temperature / Pain Maceration: No Temperature: No Abnormality Tenderness on Palpation: Yes Wound Preparation Ulcer Cleansing: Rinsed/Irrigated with Saline Topical Anesthetic Applied: Other: lidocaine 4%, Treatment Notes Wound #3 (Right, Anterior Upper Leg) 1. Cleansed with: Clean wound with Normal Saline 4. Dressing Applied: Prisma Ag 5. Secondary Dressing Applied Dry [place] Signature(s) Signed: 09/19/2017 4:43:13 PM By: Montey Hora Entered By: Montey Hora on 09/19/2017 08:21:58 Gabriela [name], Gabriela [name] M. ([id number]) -------------------------------------------------------------------------------- Vitals Details Patient Name: MADOLINE, BHATT. Date of Service: 09/19/2017 8:00 AM Medical Record Number: [id number] Patient Account Number: [id number] Date of Birth/Sex: Mar 19, 1926 (84 y.o. Female) Treating RN: Montey Hora Primary Care Deovion Batrez: Cranford Mon, Delfino Lovett Other Clinician: Referring Raylynn Hersh: Cranford Mon, Delfino Lovett Treating Takota Cahalan/Extender: Melburn Hake, HOYT Weeks in Treatment: 2 Vital Signs Time Taken: 08:17 Temperature (F): 98.3 Height (in): 63 Pulse (bpm): 57 Weight (lbs): 145 Respiratory Rate (breaths/min): 18 Body Mass Index (BMI): 25.7 Blood Pressure (mmHg): 145/85 Reference Range: 80 - 120 mg / dl Electronic Signature(s) Signed: 09/19/2017 4:43:13 PM By: Montey Hora Entered By: Montey Hora on 09/19/2017 08:17:39

## 2017-09-21 DIAGNOSIS — E11622 Type 2 diabetes mellitus with other skin ulcer: Secondary | ICD-10-CM | POA: Diagnosis not present

## 2017-09-21 DIAGNOSIS — Z7982 Long term (current) use of aspirin: Secondary | ICD-10-CM | POA: Diagnosis not present

## 2017-09-21 DIAGNOSIS — Z96641 Presence of right artificial hip joint: Secondary | ICD-10-CM | POA: Diagnosis not present

## 2017-09-21 DIAGNOSIS — Z791 Long term (current) use of non-steroidal anti-inflammatories (NSAID): Secondary | ICD-10-CM | POA: Diagnosis not present

## 2017-09-21 DIAGNOSIS — Z794 Long term (current) use of insulin: Secondary | ICD-10-CM | POA: Diagnosis not present

## 2017-09-21 DIAGNOSIS — I10 Essential (primary) hypertension: Secondary | ICD-10-CM | POA: Diagnosis not present

## 2017-09-21 DIAGNOSIS — L97112 Non-pressure chronic ulcer of right thigh with fat layer exposed: Secondary | ICD-10-CM | POA: Diagnosis not present

## 2017-09-25 DIAGNOSIS — Z7982 Long term (current) use of aspirin: Secondary | ICD-10-CM | POA: Diagnosis not present

## 2017-09-25 DIAGNOSIS — Z96641 Presence of right artificial hip joint: Secondary | ICD-10-CM | POA: Diagnosis not present

## 2017-09-25 DIAGNOSIS — Z791 Long term (current) use of non-steroidal anti-inflammatories (NSAID): Secondary | ICD-10-CM | POA: Diagnosis not present

## 2017-09-25 DIAGNOSIS — L97112 Non-pressure chronic ulcer of right thigh with fat layer exposed: Secondary | ICD-10-CM | POA: Diagnosis not present

## 2017-09-25 DIAGNOSIS — I10 Essential (primary) hypertension: Secondary | ICD-10-CM | POA: Diagnosis not present

## 2017-09-25 DIAGNOSIS — E11622 Type 2 diabetes mellitus with other skin ulcer: Secondary | ICD-10-CM | POA: Diagnosis not present

## 2017-09-25 DIAGNOSIS — Z794 Long term (current) use of insulin: Secondary | ICD-10-CM | POA: Diagnosis not present

## 2017-09-28 DIAGNOSIS — L97112 Non-pressure chronic ulcer of right thigh with fat layer exposed: Secondary | ICD-10-CM | POA: Diagnosis not present

## 2017-09-28 DIAGNOSIS — Z7982 Long term (current) use of aspirin: Secondary | ICD-10-CM | POA: Diagnosis not present

## 2017-09-28 DIAGNOSIS — Z794 Long term (current) use of insulin: Secondary | ICD-10-CM | POA: Diagnosis not present

## 2017-09-28 DIAGNOSIS — Z96641 Presence of right artificial hip joint: Secondary | ICD-10-CM | POA: Diagnosis not present

## 2017-09-28 DIAGNOSIS — E11622 Type 2 diabetes mellitus with other skin ulcer: Secondary | ICD-10-CM | POA: Diagnosis not present

## 2017-09-28 DIAGNOSIS — Z791 Long term (current) use of non-steroidal anti-inflammatories (NSAID): Secondary | ICD-10-CM | POA: Diagnosis not present

## 2017-09-28 DIAGNOSIS — I10 Essential (primary) hypertension: Secondary | ICD-10-CM | POA: Diagnosis not present

## 2017-09-30 DIAGNOSIS — I10 Essential (primary) hypertension: Secondary | ICD-10-CM | POA: Diagnosis not present

## 2017-09-30 DIAGNOSIS — Z794 Long term (current) use of insulin: Secondary | ICD-10-CM | POA: Diagnosis not present

## 2017-09-30 DIAGNOSIS — E11622 Type 2 diabetes mellitus with other skin ulcer: Secondary | ICD-10-CM | POA: Diagnosis not present

## 2017-09-30 DIAGNOSIS — L97112 Non-pressure chronic ulcer of right thigh with fat layer exposed: Secondary | ICD-10-CM | POA: Diagnosis not present

## 2017-09-30 DIAGNOSIS — Z791 Long term (current) use of non-steroidal anti-inflammatories (NSAID): Secondary | ICD-10-CM | POA: Diagnosis not present

## 2017-09-30 DIAGNOSIS — Z7982 Long term (current) use of aspirin: Secondary | ICD-10-CM | POA: Diagnosis not present

## 2017-09-30 DIAGNOSIS — Z96641 Presence of right artificial hip joint: Secondary | ICD-10-CM | POA: Diagnosis not present

## 2017-10-02 DIAGNOSIS — Z791 Long term (current) use of non-steroidal anti-inflammatories (NSAID): Secondary | ICD-10-CM | POA: Diagnosis not present

## 2017-10-02 DIAGNOSIS — Z96641 Presence of right artificial hip joint: Secondary | ICD-10-CM | POA: Diagnosis not present

## 2017-10-02 DIAGNOSIS — Z7982 Long term (current) use of aspirin: Secondary | ICD-10-CM | POA: Diagnosis not present

## 2017-10-02 DIAGNOSIS — E11622 Type 2 diabetes mellitus with other skin ulcer: Secondary | ICD-10-CM | POA: Diagnosis not present

## 2017-10-02 DIAGNOSIS — Z794 Long term (current) use of insulin: Secondary | ICD-10-CM | POA: Diagnosis not present

## 2017-10-02 DIAGNOSIS — I10 Essential (primary) hypertension: Secondary | ICD-10-CM | POA: Diagnosis not present

## 2017-10-02 DIAGNOSIS — L97112 Non-pressure chronic ulcer of right thigh with fat layer exposed: Secondary | ICD-10-CM | POA: Diagnosis not present

## 2017-10-03 ENCOUNTER — Ambulatory Visit: Payer: Medicare Other | Admitting: Physician Assistant

## 2017-10-04 DIAGNOSIS — Z96641 Presence of right artificial hip joint: Secondary | ICD-10-CM | POA: Diagnosis not present

## 2017-10-04 DIAGNOSIS — L97112 Non-pressure chronic ulcer of right thigh with fat layer exposed: Secondary | ICD-10-CM | POA: Diagnosis not present

## 2017-10-04 DIAGNOSIS — E11622 Type 2 diabetes mellitus with other skin ulcer: Secondary | ICD-10-CM | POA: Diagnosis not present

## 2017-10-04 DIAGNOSIS — Z7982 Long term (current) use of aspirin: Secondary | ICD-10-CM | POA: Diagnosis not present

## 2017-10-04 DIAGNOSIS — I10 Essential (primary) hypertension: Secondary | ICD-10-CM | POA: Diagnosis not present

## 2017-10-04 DIAGNOSIS — Z794 Long term (current) use of insulin: Secondary | ICD-10-CM | POA: Diagnosis not present

## 2017-10-04 DIAGNOSIS — Z791 Long term (current) use of non-steroidal anti-inflammatories (NSAID): Secondary | ICD-10-CM | POA: Diagnosis not present

## 2017-10-05 DIAGNOSIS — L97112 Non-pressure chronic ulcer of right thigh with fat layer exposed: Secondary | ICD-10-CM | POA: Diagnosis not present

## 2017-10-05 DIAGNOSIS — E11622 Type 2 diabetes mellitus with other skin ulcer: Secondary | ICD-10-CM | POA: Diagnosis not present

## 2017-10-06 DIAGNOSIS — Z794 Long term (current) use of insulin: Secondary | ICD-10-CM | POA: Diagnosis not present

## 2017-10-06 DIAGNOSIS — E11622 Type 2 diabetes mellitus with other skin ulcer: Secondary | ICD-10-CM | POA: Diagnosis not present

## 2017-10-06 DIAGNOSIS — I10 Essential (primary) hypertension: Secondary | ICD-10-CM | POA: Diagnosis not present

## 2017-10-06 DIAGNOSIS — Z791 Long term (current) use of non-steroidal anti-inflammatories (NSAID): Secondary | ICD-10-CM | POA: Diagnosis not present

## 2017-10-06 DIAGNOSIS — L97112 Non-pressure chronic ulcer of right thigh with fat layer exposed: Secondary | ICD-10-CM | POA: Diagnosis not present

## 2017-10-06 DIAGNOSIS — Z7982 Long term (current) use of aspirin: Secondary | ICD-10-CM | POA: Diagnosis not present

## 2017-10-06 DIAGNOSIS — Z96641 Presence of right artificial hip joint: Secondary | ICD-10-CM | POA: Diagnosis not present

## 2017-10-08 DIAGNOSIS — Z791 Long term (current) use of non-steroidal anti-inflammatories (NSAID): Secondary | ICD-10-CM | POA: Diagnosis not present

## 2017-10-08 DIAGNOSIS — Z96641 Presence of right artificial hip joint: Secondary | ICD-10-CM | POA: Diagnosis not present

## 2017-10-08 DIAGNOSIS — I10 Essential (primary) hypertension: Secondary | ICD-10-CM | POA: Diagnosis not present

## 2017-10-08 DIAGNOSIS — Z794 Long term (current) use of insulin: Secondary | ICD-10-CM | POA: Diagnosis not present

## 2017-10-08 DIAGNOSIS — E11622 Type 2 diabetes mellitus with other skin ulcer: Secondary | ICD-10-CM | POA: Diagnosis not present

## 2017-10-08 DIAGNOSIS — L97112 Non-pressure chronic ulcer of right thigh with fat layer exposed: Secondary | ICD-10-CM | POA: Diagnosis not present

## 2017-10-08 DIAGNOSIS — Z7982 Long term (current) use of aspirin: Secondary | ICD-10-CM | POA: Diagnosis not present

## 2017-10-10 ENCOUNTER — Other Ambulatory Visit
Admission: RE | Admit: 2017-10-10 | Discharge: 2017-10-10 | Disposition: A | Discharge status: Home / Self Care | Payer: Medicare Other | Source: Ambulatory Visit | Attending: Physician Assistant | Admitting: Physician Assistant

## 2017-10-10 ENCOUNTER — Other Ambulatory Visit: Payer: Self-pay | Admitting: Physician Assistant

## 2017-10-10 ENCOUNTER — Encounter: Payer: Medicare Other | Admitting: Physician Assistant

## 2017-10-10 DIAGNOSIS — E11622 Type 2 diabetes mellitus with other skin ulcer: Secondary | ICD-10-CM | POA: Insufficient documentation

## 2017-10-10 DIAGNOSIS — Z8249 Family history of ischemic heart disease and other diseases of the circulatory system: Secondary | ICD-10-CM | POA: Diagnosis not present

## 2017-10-10 DIAGNOSIS — L97112 Non-pressure chronic ulcer of right thigh with fat layer exposed: Secondary | ICD-10-CM | POA: Diagnosis not present

## 2017-10-10 DIAGNOSIS — Z882 Allergy status to sulfonamides status: Secondary | ICD-10-CM | POA: Diagnosis not present

## 2017-10-10 DIAGNOSIS — Z96641 Presence of right artificial hip joint: Secondary | ICD-10-CM | POA: Diagnosis not present

## 2017-10-10 DIAGNOSIS — M1990 Unspecified osteoarthritis, unspecified site: Secondary | ICD-10-CM | POA: Diagnosis not present

## 2017-10-10 DIAGNOSIS — I10 Essential (primary) hypertension: Secondary | ICD-10-CM | POA: Diagnosis not present

## 2017-10-10 DIAGNOSIS — L97119 Non-pressure chronic ulcer of right thigh with unspecified severity: Secondary | ICD-10-CM | POA: Insufficient documentation

## 2017-10-10 DIAGNOSIS — Z794 Long term (current) use of insulin: Secondary | ICD-10-CM | POA: Diagnosis not present

## 2017-10-10 DIAGNOSIS — E1121 Type 2 diabetes mellitus with diabetic nephropathy: Secondary | ICD-10-CM | POA: Diagnosis not present

## 2017-10-10 DIAGNOSIS — H409 Unspecified glaucoma: Secondary | ICD-10-CM | POA: Diagnosis not present

## 2017-10-10 DIAGNOSIS — Z951 Presence of aortocoronary bypass graft: Secondary | ICD-10-CM | POA: Diagnosis not present

## 2017-10-10 LAB — COMPREHENSIVE METABOLIC PANEL
ALK PHOS: 110 U/L (ref 38–126)
ALT: 7 U/L — AB (ref 14–54)
AST: 17 U/L (ref 15–41)
Albumin: 4.3 g/dL (ref 3.5–5.0)
Anion gap: 11 (ref 5–15)
BUN: 35 mg/dL — ABNORMAL HIGH (ref 6–20)
CALCIUM: 9.1 mg/dL (ref 8.9–10.3)
CO2: 19 mmol/L — ABNORMAL LOW (ref 22–32)
CREATININE: 1.37 mg/dL — AB (ref 0.44–1.00)
Chloride: 109 mmol/L (ref 101–111)
GFR calc Af Amer: 38 mL/min — ABNORMAL LOW (ref >60)
GFR, EST NON AFRICAN AMERICAN: 33 mL/min — AB (ref >60)
Glucose, Bld: 128 mg/dL — ABNORMAL HIGH (ref 65–99)
Potassium: 4.3 mmol/L (ref 3.5–5.1)
Sodium: 139 mmol/L (ref 135–145)
Total Bilirubin: 0.7 mg/dL (ref 0.3–1.2)
Total Protein: 8.7 g/dL — ABNORMAL HIGH (ref 6.5–8.1)

## 2017-10-11 DIAGNOSIS — Z7982 Long term (current) use of aspirin: Secondary | ICD-10-CM | POA: Diagnosis not present

## 2017-10-11 DIAGNOSIS — L97112 Non-pressure chronic ulcer of right thigh with fat layer exposed: Secondary | ICD-10-CM | POA: Diagnosis not present

## 2017-10-11 DIAGNOSIS — I10 Essential (primary) hypertension: Secondary | ICD-10-CM | POA: Diagnosis not present

## 2017-10-11 DIAGNOSIS — Z794 Long term (current) use of insulin: Secondary | ICD-10-CM | POA: Diagnosis not present

## 2017-10-11 DIAGNOSIS — E11622 Type 2 diabetes mellitus with other skin ulcer: Secondary | ICD-10-CM | POA: Diagnosis not present

## 2017-10-11 DIAGNOSIS — Z96641 Presence of right artificial hip joint: Secondary | ICD-10-CM | POA: Diagnosis not present

## 2017-10-11 DIAGNOSIS — Z791 Long term (current) use of non-steroidal anti-inflammatories (NSAID): Secondary | ICD-10-CM | POA: Diagnosis not present

## 2017-10-11 LAB — HEMOGLOBIN A1C
Hgb A1c MFr Bld: 6.2 % — ABNORMAL HIGH (ref 4.8–5.6)
Mean Plasma Glucose: 131 mg/dL

## 2017-10-12 NOTE — Progress Notes (Addendum)
Coulston, Walda M. ([id number]) Visit Report for 10/10/2017 Chief Complaint Document Details Patient Name: Gabriela [name], [name] Date of Service: 10/10/2017 9:15 AM Medical Record Number: [id number] Patient Account Number: [id number] Date of Birth/Sex: 13-Aug-1926 (84 y.o. Female) Treating RN: Carolyne Fiscal, Debi Primary Care Provider: Cranford Mon, Delfino Lovett Other Clinician: Referring Provider: Wilhemena Durie Treating Provider/Extender: Melburn Hake, Rashiya Lofland Weeks in Treatment: 5 Information Obtained from: Patient Chief Complaint She presents in follow up for a right anterior thigh wound/abscess Electronic Signature(s) Signed: 10/10/2017 5:35:20 PM By: Worthy Keeler PA-C Entered By: Worthy Keeler on 10/10/2017 10:27:28 Doenges, Kassi M. ([id number]) -------------------------------------------------------------------------------- HPI Details Patient Name: Gabriela [name], [name] Date of Service: 10/10/2017 9:15 AM Medical Record Number: [id number] Patient Account Number: [id number] Date of Birth/Sex: Jul 09, 1926 (84 y.o. Female) Treating RN: Carolyne Fiscal, Debi Primary Care Provider: Cranford Mon, Delfino Lovett Other Clinician: Referring Provider: Wilhemena Durie Treating Provider/Extender: Melburn Hake, Halea Lieb Weeks in Treatment: 5 History of Present Illness HPI Description: 84 year old female with right thigh abscess and ulceration of the right thigh noted by her orthopedic surgeon Dr. Kurtis Bushman. The patient has had right total hip arthroplasty with ostial lysis done recently in June 2017 and x-ray done postoperatively looked good. Patient was previously treated with Keflex and Septra. reviewing the orthopedic notes Dr. Sabra Heck had done a IandD of the abscess somewhere in June and at some stage had cauterized hyperbaric granulation tissue which continues to have drainage from the wound. 07/12/17; this is a patient who was seen one time by Dr. Con Memos on 13/04/2017. She did not return to our clinic. She  was dressed at that point with [facility]. I think she has been followed by orthopedics A Dr Harlow Mares and Dr. Sabra Heck of orthopedics for this area since then. He has been using peroxide to the area. The history here is a bit difficult to follow. It sounds as though she had an abscess develop roughly 6 months ago. There was no trauma. Apparently she had an IandD done but I don't see any culture results. X-rays have been done that show her previous total hip replacement to be in good position. Her total hip replacement was done 20 years ago per the patient and she had not had any problems in the interim. She has not had any pain in the area. She has no systemic symptoms including fever or chills. She is a type II diabetic on oral agents and Levemir 09/05/17 patient presents concerning her right thigh separation which has for the most part on inspection today closed over with epithelium although there is a small problematic for her. Nonetheless when she last saw Dr. Dellia Nims on July 12, 2017 he didn't do an incision and drainage and happened to mention that she may need additional testing depending on how things proceeded. However she did not come back until just now when I'm seeing her for reevaluation today. With that being said she does not seem to have any severe pain. She does continue to have drainage from the site and it has never really fully cleared and closed out as would be appropriate for a wound of this nature. Some of this may be due to the fact that she is not having appropriate follow-up and so we are not able to truly have this granulated appropriately. With that being said it does seem that she may have something more deeply draining and causing problems at this point. The good news is she's not having as much pain as previous. Patient is seen along  with her granddaughter in the office at this point in time. The issue that seems to be underlying this wound was explained to both  patient as well as her granddaughter No fevers, chills, nausea, or vomiting noted at this time. Again this sounds to have started initially has managed by Dr. Sabra Heck according to patient as an abscess that unfortunately has had a very difficult time healing. After reviewing the notes here further we may need to consider an MRI depending on how she is progressing at the next follow-up. We will see how things are doing. 09/19/17 on evaluation today patient appears to be doing excellent in regard to her right thigh ulcer. She has been tolerating the dressing changes without complication she does seem to be granulating in from the base and there is no deeper tunneling at this point as there was after I initially open this area up. Overall this seems to be doing very well and is feeling much better than it did last time around I do believe. Fortunately she has no significant discomfort in fact she has no pain at all except one we are probing around in the base of the wound. We are using Prisma currently. 10/10/17 on evaluation today patient appears to be doing a little worse in regard to her right thigh ulcer. We did receive a call last week from home health nurse due to the fact that the wound had opened up more significantly and was tunneling at the 11 o'clock to 12 o'clock location. Subsequently we switch to packing this with Iodoform gauze until patient was seen today in follow-up. She does not seem to be having any significant discomfort in regard to the ulcer. Nonetheless there has been pus drainage noted although during the evaluation today I was not able to visualize this personally. She does have no evidence of erythema surrounding the wound at this point. Electronic Signature(s) Signed: 10/10/2017 5:35:20 PM By: Worthy Keeler PA-C Entered By: Worthy Keeler on 10/10/2017 10:29:50 Brodzinski, Kaliopi M. ([id number]) Delis, Kambria M.  ([id number]) -------------------------------------------------------------------------------- Physical Exam Details Patient Name: Gabriela [name], [name] Date of Service: 10/10/2017 9:15 AM Medical Record Number: [id number] Patient Account Number: [id number] Date of Birth/Sex: [birth year] (84 y.o. Female) Treating RN: Carolyne Fiscal, Debi Primary Care Provider: Cranford Mon, Delfino Lovett Other Clinician: Referring Provider: Cranford Mon, RICHARD Treating Provider/Extender: Melburn Hake, Keil Pickering Weeks in Treatment: 5 Constitutional Well-nourished and well-hydrated in no acute distress. Respiratory normal breathing without difficulty. clear to auscultation bilaterally. Cardiovascular regular rate and rhythm with normal S1, S2. Psychiatric this patient is able to make decisions and demonstrates good insight into disease process. Alert and Oriented x 3. pleasant and cooperative. Notes On evaluation and far as the tunneling is concerned at the 11 to 12 o'clock location I was able to get the parity of a skinny probe up into the tunnel and then it still seems to be doing more deeply I just did not have any tool by which to measure the true depth of this. I believe this may be the reason that she is not healing this continues to drain from potentially more proximal site thereby keeping the wound open. No debridement was required today. Electronic Signature(s) Signed: 10/10/2017 5:35:20 PM By: Worthy Keeler PA-C Entered By: Worthy Keeler on 10/10/2017 10:32:04 Jemmott, Saleema M. ([id number]) -------------------------------------------------------------------------------- Physician Orders Details Patient Name: Gabriela [name], [name] Date of Service: 10/10/2017 9:15 AM Medical Record Number: [id number] Patient Account Number: [id number] Date of Birth/Sex: 08-31-1925 (84 y.o. Female) Treating RN:  Carolyne Fiscal, Debi Primary Care Provider: Cranford Mon, Delfino Lovett Other Clinician: Referring Provider: Cranford Mon, RICHARD Treating  Provider/Extender: Sharalyn Ink in Treatment: 5 Verbal / Phone Orders: Yes Clinician: Carolyne Fiscal, Debi Read Back and Verified: Yes Diagnosis Coding Wound Cleansing Wound #3 Right,Anterior Upper Leg o Clean wound with Normal Saline. o Cleanse wound with mild soap and water o May Shower, gently pat wound dry prior to applying new dressing. Anesthetic (add to Medication List) Wound #3 Right,Anterior Upper Leg o Topical Lidocaine 4% cream applied to wound bed prior to debridement (In Clinic Only). Primary Wound Dressing Wound #3 Right,Anterior Upper Leg o Iodoform packing Gauze - 1/4 " inch Secondary Dressing Wound #3 Right,Anterior Upper Leg o [place] Dressing Change Frequency Wound #3 Right,Anterior Upper Leg o Change dressing every other day. Follow-up Appointments Wound #3 Right,Anterior Upper Leg o Return Appointment in 1 week. Additional Orders / Instructions Wound #3 Right,Anterior Upper Leg o Increase protein intake. Home Health Wound #3 [place] Nurse may visit PRN to address patientos wound care needs. o FACE TO FACE ENCOUNTER: MEDICARE and MEDICAID PATIENTS: I certify that this patient is under my care and that I had a face-to-face encounter that meets the physician face-to-face encounter requirements with this patient on this date. The encounter with the patient was in whole or in part for the following MEDICAL CONDITION: (primary reason for [place]) MEDICAL NECESSITY: I certify, that based on my findings, NURSING services are a medically necessary home health service. HOME BOUND STATUS: I certify that my clinical findings support that this patient is homebound (i.e., Due to illness or injury, pt requires aid of supportive devices such as crutches, cane, wheelchairs, walkers, the use of special transportation or the assistance of another person to leave their place  of residence. There is a normal inability to leave the home Valadez, Lasundra M. ([id number]) and doing so requires considerable and taxing effort. Other absences are for medical reasons / religious services and are infrequent or of short duration when for other reasons). o If current dressing causes regression in wound condition, may D/C ordered dressing product/s and apply Normal Saline Moist Dressing daily until next [place] / Other MD appointment. [place] of regression in wound condition at [phone]. o Please direct any NON-WOUND related issues/requests for orders to patient's Primary Care Physician Laboratory o Hemoglobin A1c (glycated HgB)/Hemoglobin.total in Blood (CHEM) - ALSO CMP AND GFR FOR MRI oooo LOINC Code: 4548-4 oooo Convenience Name: Hb A1c -Method not specified Radiology o Magnetic Resonance Imaging (MRI) - right pelvis, right femur.Marland Kitchen..Marland Kitchenwith and w/o Patient Medications Allergies: Sulfa (Sulfonamide Antibiotics) Notifications Medication Indication Start End lidocaine DOSE 1 - topical 4 % cream - 1 cream topical Valium 10/19/2017 DOSE 1 - oral 10 mg tablet - 1 tablet oral taken 30 minutes prior to MRI x 1 dose Electronic Signature(s) Signed: 11/01/2017 3:36:20 AM By: Worthy Keeler PA-C Previous Signature: 10/10/2017 5:35:20 PM Version By: Worthy Keeler PA-C Previous Signature: 10/11/2017 4:30:35 PM Version By: Alric Quan Entered By: Worthy Keeler on 10/18/2017 10:36:05 Matulich, Aslan M. ([id number]) -------------------------------------------------------------------------------- Prescription 10/10/2017 Patient Name: Gabriela [name], [name] Provider: Worthy Keeler PA-C Date of Birth: 24-Jun-1926 NPI#: [id number] Sex: F DEA#: [id number] Phone #: [phone] License #: Patient Address: [facility] [place], [place] [address] [address] [place], [place] [address] [phone]  Allergies Sulfa (Sulfonamide Antibiotics) Medication Medication: Route: Strength: Form: lidocaine 4 % topical cream topical 4% cream Class: TOPICAL LOCAL ANESTHETICS Dose: Frequency / Time: Indication: 1 1 cream topical Number of Refills: Number of Units: 0 Generic Substitution: Start Date: End Date: One Time Use: Substitution Permitted No Note to Pharmacy: Signature(s): Date(s): Friddle, Zyann M. ([id number]) Prescription 10/10/2017 Patient Name: Gabriela [name], [name] Provider: Worthy Keeler PA-C Date of Birth: Jul 05, 1926 NPI#: [id number] Sex: F DEA#: [id number] Phone #: [phone] License #: Patient Address: [facility] [place], [place] [address] [address] [place], [place] [address] [phone] Allergies Sulfa (Sulfonamide Antibiotics) Medication Note: This prescription was automatically generated because the medication is a controlled substance, and cannot be prescribed electronically. Medication: Route: Strength: Form: Valium oral 10 mg tablet Class: ANTI-ANXIETY - BENZODIAZEPINES Dose: Frequency / Time: Indication: 1 1 tablet oral taken 30 minutes prior to MRI x 1 dose Number of Refills: Number of Units: 0 One (1) Tablet(s) Generic Substitution: Start Date: End Date: Administered at Substitution Permitted 08/21/7937 Facility: No Note to Pharmacy: Signature(s): Date(s): Electronic Signature(s) Signed: 11/01/2017 3:36:20 AM By: Worthy Keeler PA-C Previous Signature: 10/10/2017 5:35:20 PM Version By: Worthy Keeler PA-C Previous Signature: 10/11/2017 4:30:35 PM Version By: Coralie Common, Latishia M. ([id number]) Entered By: Worthy Keeler on 10/18/2017 10:36:15 Moehring, Torah M.  ([id number]) --------------------------------------------------------------------------------  Problem List Details Patient Name: Gabriela [name], [name] Date of Service: 10/10/2017 9:15 AM Medical Record Number: [id number] Patient Account Number: [id number] Date of Birth/Sex: [birth year] (84 y.o. 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 ([id number]) -------------------------------------------------------------------------------- Progress Note Details Patient Name: Gabriela [name], [name] Date of Service: 10/10/2017 9:15 AM Medical Record Number: [id number] Patient Account Number: [id number] Date of Birth/Sex: 1926-04-06 (84 y.o. Female) Treating RN: Carolyne Fiscal, Debi Primary Care Provider: Cranford Mon, Delfino Lovett Other Clinician: Referring Provider: Wilhemena Durie Treating Provider/Extender: Melburn Hake, Gunter Conde Weeks in Treatment: 5 Subjective Chief Complaint Information obtained from Patient She presents in follow up for a right anterior thigh wound/abscess History of Present Illness (HPI) 84 year old female with right thigh abscess and ulceration of the right thigh noted by her orthopedic surgeon Dr. Kurtis Bushman. The patient has had right total hip arthroplasty with ostial lysis done recently in June 2017  and x-ray done postoperatively looked good. Patient was previously treated with Keflex and Septra. reviewing the orthopedic notes Dr. Sabra Heck had done a IandD of the abscess somewhere in June and at some stage had cauterized hyperbaric granulation tissue which continues to have drainage from the wound. 07/12/17; this is a patient who was seen one time by Dr. Con Memos on 13/04/2017. She did not return to our clinic. She was dressed at that point with [facility] I think she has been followed by orthopedics A Dr Harlow Mares and Dr. Sabra Heck of orthopedics for this area since then. He has been using peroxide to the area.  The history here is a bit difficult to follow. It sounds as though she had an abscess develop roughly 6 months ago. There was no trauma. Apparently she had an IandD done but I don't see any culture results. X-rays have been done that show her previous total hip replacement to be in good position. Her total hip replacement was done 20 years ago per the patient and she had not had any problems in the interim. She has not had any pain in the area. She has no systemic symptoms including fever or chills. She is a type II diabetic on oral agents and Levemir 09/05/17 patient presents concerning her right thigh separation which has for the most part on inspection today closed over with epithelium although there is a small problematic for her. Nonetheless when she last saw Dr. Dellia Nims on July 12, 2017 he didn't do an incision and drainage and happened to mention that she may need additional testing depending on how things proceeded. However she did not come back until just now when I'm seeing her for reevaluation today. With that being said she does not seem to have any severe pain. She does continue to have drainage from the site and it has never really fully cleared and closed out as would be appropriate for a wound of this nature. Some of this may be due to the fact that she is not having  appropriate follow-up and so we are not able to truly have this granulated appropriately. With that being said it does seem that she may have something more deeply draining and causing problems at this point. The good news is she's not having as much pain as previous. Patient is seen along with her granddaughter in the office at this point in time. The issue that seems to be underlying this wound was explained to both patient as well as her granddaughter No fevers, chills, nausea, or vomiting noted at this time. Again this sounds to have started initially has managed by Dr. Sabra Heck according to patient as an abscess that unfortunately has had a very difficult time healing. After reviewing the notes here further we may need to consider an MRI depending on how she is progressing at the next follow-up. We will see how things are doing. 09/19/17 on evaluation today patient appears to be doing excellent in regard to her right thigh ulcer. She has been tolerating the dressing changes without complication she does seem to be granulating in from the base and there is no deeper tunneling at this point as there was after I initially open this area up. Overall this seems to be doing very well and is feeling much better than it did last time around I do believe. Fortunately she has no significant discomfort in fact she has no pain at all except one we are probing around in the base of the wound. We are using Prisma currently. 10/10/17 on evaluation today patient appears to be doing a little worse in regard to her right thigh ulcer. We did receive a call last week from home health nurse due to the fact that the wound had opened up more significantly and was tunneling at the 11 o'clock to 12 o'clock location. Subsequently we switch to packing this with Iodoform gauze until patient was seen today in follow-up. She does not seem to be having any significant discomfort in regard to the ulcer. Nonetheless there has been  pus drainage noted although during the evaluation today I was not able to visualize  this personally. She does have no evidence of erythema surrounding the wound at this point. Gabriela [name], [name] ([id number]) Patient History Information obtained from Patient. Family History Cancer - Child, Diabetes - Child, Heart Disease - Child, No family history of Hypertension, Kidney Disease, Lung Disease, Seizures, Stroke, Thyroid Problems, Tuberculosis. Social History Never smoker, Marital Status - Widowed, Alcohol Use - Never, Drug Use - No History, Caffeine Use - Daily. Medical And Surgical History Notes Constitutional Symptoms (General Health) High Blood Pressure, Diabetes, Aspirin for heart, fluid pil Cardiovascular Heart Bypass Review of Systems (ROS) Constitutional Symptoms (General Health) Denies complaints or symptoms of Fever, Chills. Respiratory The patient has no complaints or symptoms. Cardiovascular The patient has no complaints or symptoms. Psychiatric The patient has no complaints or symptoms. Objective Constitutional Well-nourished and well-hydrated in no acute distress. Vitals Time Taken: 8:45 AM, Height: 63 in, Weight: 145 lbs, BMI: 25.7, Temperature: 98.2 F, Pulse: 60 bpm, Respiratory Rate: 18 breaths/min, Blood Pressure: 148/80 mmHg. Respiratory normal breathing without difficulty. clear to auscultation bilaterally. Cardiovascular regular rate and rhythm with normal S1, S2. Psychiatric this patient is able to make decisions and demonstrates good insight into disease process. Alert and Oriented x 3. pleasant and cooperative. General Notes: On evaluation and far as the tunneling is concerned at the 11 to 12 o'clock location I was able to get the parity of a skinny probe up into the tunnel and then it still seems to be doing more deeply I just did not have any tool by which to measure the true depth of this. I believe this may be the reason that she is not healing this  continues to drain from potentially more proximal site thereby keeping the wound open. No debridement was required today. Gabriela [name], Airiel M. ([id number]) Integumentary (Hair, Skin) Wound #3 status is Open. Original cause of wound was Bump. The wound is located on the Right,Anterior Upper Leg. The wound measures 0.5cm length x 0.5cm width x 2.3cm depth; 0.196cm^2 area and 0.452cm^3 volume. There is Fat Layer (Subcutaneous Tissue) Exposed exposed. There is no tunneling or undermining noted. There is a large amount of purulent drainage noted. The wound margin is well defined and not attached to the wound base. There is large (67-100%) red granulation within the wound bed. There is no necrotic tissue within the wound bed. The periwound skin appearance exhibited: Hemosiderin Staining. The periwound skin appearance did not exhibit: Callus, Crepitus, Excoriation, Induration, Rash, Scarring, Dry/Scaly, Maceration, Atrophie Blanche, Cyanosis, Ecchymosis, Mottled, Pallor, Rubor, Erythema. Periwound temperature was noted as No Abnormality. The periwound has tenderness on palpation. 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 Cleanse wound with mild soap and water May Shower, gently pat wound dry prior to applying new dressing. Anesthetic (add to Medication List): Wound #3 Right,Anterior Upper Leg: Topical Lidocaine 4% cream applied to wound bed prior to debridement (In Clinic Only). Primary Wound Dressing: Wound #3 Right,Anterior Upper Leg: Iodoform packing Gauze - 1/4 " inch Secondary Dressing: Wound #3 Right,Anterior Upper Leg: [place] Dressing Change Frequency: Wound #3  Right,Anterior Upper Leg: Change dressing every other day. Follow-up Appointments: Wound #3 Right,Anterior Upper Leg: Return Appointment in 1 week. Additional Orders / Instructions: Wound #3 Right,Anterior Upper Leg: Increase protein intake. Home  Health: Wound #3 Right,Anterior Upper Leg: Continue Home Health Visits Home Health Nurse may visit PRN to address patient s wound care needs. Vasco, Jeraline M. ([id number]) FACE TO FACE ENCOUNTER: MEDICARE and MEDICAID PATIENTS: I certify that this patient is under my care and that I had a face-to-face encounter that meets the physician face-to-face encounter requirements with this patient on this date. The encounter with the patient was in whole or in part for the following MEDICAL CONDITION: (primary reason for [place]) MEDICAL NECESSITY: I certify, that based on my findings, NURSING services are a medically necessary home health service. HOME BOUND STATUS: I certify that my clinical findings support that this patient is homebound (i.e., Due to illness or injury, pt requires aid of supportive devices such as crutches, cane, wheelchairs, walkers, the use of special transportation or the assistance of another person to leave their place of residence. There is a normal inability to leave the home and doing so requires considerable and taxing effort. Other absences are for medical reasons / religious services and are infrequent or of short duration when for other reasons). If current dressing causes regression in wound condition, may D/C ordered dressing product/s and apply Normal Saline Moist Dressing daily until next [place] / Other MD appointment. [place] of regression in wound condition at [phone]. Please direct any NON-WOUND related issues/requests for orders to patient's Primary Care Physician Laboratory ordered were: Hb A1c -Method not specified - ALSO CMP AND GFR FOR MRI Radiology ordered  were: Magnetic Resonance Imaging (MRI) - right pelvis, right femur.Marland Kitchen..Marland Kitchenwith and w/o The following medication(s) was prescribed: lidocaine topical 4 % cream 1 1 cream topical was prescribed at facility Valium oral 10 mg tablet 1 1 tablet oral taken 30 minutes prior to MRI x 1 dose starting 10/19/2017 At this time I'm going to recommend that we further evaluate the fact that patient may have a deeper abscess in the thigh or pelvis area possibly even a osteomyelitis site that could be accounting for why this wound is continue to remain open. For that reason I'm ordering a MRI of the pelvis and femur with and without contrast. I'm also gonna place the order for the CandP with GFR along with a hemoglobin A1c. We will see how the lab testing comes back and subsequently we will also see what the study showed themselves. Unless something seems to be a central location for the drainage that is exiting at this point of the ulcer which I think is what's keeping this open unfortunately. We will see were things stand in one weeks time. Please see above for specific wound care orders. We will see patient for re-evaluation in 1 week(s) here in the clinic. If anything worsens or changes patient will contact our office for additional recommendations. Electronic Signature(s) Signed: 11/01/2017 3:36:20 AM By: Worthy Keeler PA-C Previous Signature: 10/10/2017 5:35:20 PM Version By: Worthy Keeler PA-C Entered By: Worthy Keeler on 10/18/2017 10:54:45 Riano, Maili M. ([id number]) -------------------------------------------------------------------------------- ROS/PFSH Details Patient Name: Gabriela [name], [name] Date of Service: 10/10/2017 9:15 AM Medical Record Number: [id number] Patient Account Number: [id number] Date of Birth/Sex: 1926/06/29 (84 y.o. Female) Treating RN: Carolyne Fiscal, Debi Primary Care Provider: Cranford Mon, Delfino Lovett Other Clinician: Referring Provider: Cranford Mon, Delfino Lovett Treating Provider/Extender:  Melburn Hake, Laruth Hanger Weeks in Treatment: 5 Information Obtained From Patient Wound History Do you currently have one or more open woundso Yes How many open wounds do you currently haveo 1 Approximately how long have you  had your woundso 5 months How have you been treating your wound(s) until nowo ointment Has your wound(s) ever healed and then re-openedo No Have you had any lab work done in the past montho No Have you tested positive for an antibiotic resistant organism (MRSA, VRE)o No Have you tested positive for osteomyelitis (bone infection)o No Have you had any tests for circulation on your legso No Constitutional Symptoms (General Health) Complaints and Symptoms: Negative for: Fever; Chills Medical History: Past Medical History Notes: High Blood Pressure, Diabetes, Aspirin for heart, fluid pil Eyes Medical History: Positive for: Cataracts - removed; Glaucoma Negative for: Optic Neuritis Ear/Nose/Mouth/Throat Medical History: Negative for: Chronic sinus problems/congestion; Middle ear problems Hematologic/Lymphatic Medical History: Positive for: Anemia Negative for: Hemophilia; Human Immunodeficiency Virus; Lymphedema; Sickle Cell Disease Respiratory Complaints and Symptoms: No Complaints or Symptoms Medical History: Negative for: Aspiration; Asthma; Chronic Obstructive Pulmonary Disease (COPD); Pneumothorax; Sleep Apnea; Tuberculosis Cardiovascular Bonds, Oliana M. ([id number]) Complaints and Symptoms: No Complaints or Symptoms Medical History: Positive for: Hypertension Negative for: Angina; Arrhythmia; Congestive Heart Failure; Coronary Artery Disease; Deep Vein Thrombosis; Hypotension; Myocardial Infarction; Peripheral Arterial Disease; Peripheral Venous Disease; Phlebitis; Vasculitis Past Medical History Notes: Heart Bypass Gastrointestinal Medical History: Negative for: Cirrhosis ; Colitis; Crohnos; Hepatitis A; Hepatitis C Endocrine Medical History: Positive  for: Type II Diabetes Negative for: Type I Diabetes Time with diabetes: 69 Treated with: Insulin Blood sugar tested every day: No Genitourinary Medical History: Negative for: End Stage Renal Disease Immunological Medical History: Negative for: Lupus Erythematosus; Raynaudos; Scleroderma Integumentary (Skin) Medical History: Negative for: History of Burn; History of pressure wounds Musculoskeletal Medical History: Positive for: Osteoarthritis Negative for: Gout; Rheumatoid Arthritis; Osteomyelitis Neurologic Medical History: Negative for: Dementia; Neuropathy; Quadriplegia; Paraplegia; Seizure Disorder Oncologic Medical History: Negative for: Received Chemotherapy; Received Radiation Psychiatric Complaints and Symptoms: No Complaints or Symptoms Mende, Vonnetta M. ([id number]) Medical History: Negative for: Anorexia/bulimia; Confinement Anxiety HBO Extended History Items Eyes: Eyes: Cataracts Glaucoma Immunizations Pneumococcal Vaccine: Received Pneumococcal Vaccination: Yes Implantable Devices Family and Social History Cancer: Yes - Child; Diabetes: Yes - Child; Heart Disease: Yes - Child; Hypertension: No; Kidney Disease: No; Lung Disease: No; Seizures: No; Stroke: No; Thyroid Problems: No; Tuberculosis: No; Never smoker; Marital Status - Widowed; Alcohol Use: Never; Drug Use: No History; Caffeine Use: Daily; Advanced Directives: No; Patient does not want information on Advanced Directives; Living Will: No; Medical Power of Attorney: No Physician Affirmation I have reviewed and agree with the above information. Electronic Signature(s) Signed: 10/10/2017 5:35:20 PM By: Worthy Keeler PA-C Signed: 10/11/2017 4:30:35 PM By: Alric Quan Entered By: Worthy Keeler on 10/10/2017 10:30:28 Gabriela [name], [name] ([id number]) -------------------------------------------------------------------------------- SuperBill Details Patient Name: SHARNISE, BLOUGH. Date of Service:  10/10/2017 Medical Record Number: [id number] Patient Account Number: [id number] Date of Birth/Sex: September 04, 1925 (84 y.o. 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 Facility Procedures CPT4 Code: [id number] Description: 99213 - WOUND CARE VISIT-LEV 3 EST PT Modifier: Quantity: 1 Physician Procedures CPT4 Code: 7371062 Description: 69485 - WC PHYS LEVEL 4 - EST PT ICD-10 Diagnosis Description E11.622 Type 2 diabetes mellitus with other skin ulcer L97.112 Non-pressure chronic ulcer of right thigh with fat layer e  I10 Essential (primary) hypertension E11.21 Type 2  diabetes mellitus with diabetic nephropathy Modifier: xposed Quantity: 1 Electronic Signature(s) Signed: 10/10/2017 11:01:39 AM By: Alric Quan Signed: 10/10/2017 5:35:20 PM By: Worthy Keeler PA-C Entered By: Alric Quan on 10/10/2017 11:01:39

## 2017-10-13 DIAGNOSIS — I10 Essential (primary) hypertension: Secondary | ICD-10-CM | POA: Diagnosis not present

## 2017-10-13 DIAGNOSIS — E11622 Type 2 diabetes mellitus with other skin ulcer: Secondary | ICD-10-CM | POA: Diagnosis not present

## 2017-10-13 DIAGNOSIS — L97112 Non-pressure chronic ulcer of right thigh with fat layer exposed: Secondary | ICD-10-CM | POA: Diagnosis not present

## 2017-10-13 DIAGNOSIS — Z794 Long term (current) use of insulin: Secondary | ICD-10-CM | POA: Diagnosis not present

## 2017-10-13 DIAGNOSIS — Z791 Long term (current) use of non-steroidal anti-inflammatories (NSAID): Secondary | ICD-10-CM | POA: Diagnosis not present

## 2017-10-13 DIAGNOSIS — Z96641 Presence of right artificial hip joint: Secondary | ICD-10-CM | POA: Diagnosis not present

## 2017-10-13 DIAGNOSIS — Z7982 Long term (current) use of aspirin: Secondary | ICD-10-CM | POA: Diagnosis not present

## 2017-10-15 NOTE — Progress Notes (Signed)
Hebb, Tykesha M. ([id number]) Visit Report for 10/10/2017 Arrival Information Details Patient Name: Gabriela [name], [name] Date of Service: 10/10/2017 9:15 AM Medical Record Number: [id number] Patient Account Number: [id number] Date of Birth/Sex: [birth year] (84 y.o. Female) Treating RN: Montey Hora Primary Care Keoni Risinger: Cranford Mon, Delfino Lovett Other Clinician: Referring Kazi Reppond: Cranford Mon, Delfino Lovett Treating Vienne Corcoran/Extender: Melburn Hake, HOYT Weeks in Treatment: 5 Visit Information History Since Last Visit Added or deleted any medications: No Patient Arrived: [place] Any new allergies or adverse reactions: No Arrival Time: 08:42 Had a fall or experienced change in No Accompanied By: self activities of daily living that may affect Transfer Assistance: None risk of falls: Patient Identification Verified: Yes Signs or symptoms of abuse/neglect since last visito No Secondary Verification Process Yes Hospitalized since last visit: No Completed: Has Dressing in Place as Prescribed: Yes Patient Has Alerts: Yes Pain Present Now: No Patient Alerts: DMII ABI [place] BILATERAL >220 Electronic Signature(s) Signed: 10/10/2017 4:46:41 PM By: Montey Hora Entered By: Montey Hora on 10/10/2017 08:44:30 Blayney, Betania M. ([id number]) -------------------------------------------------------------------------------- Clinic Level of Care Assessment Details Patient Name: [name] Date of Service: 10/10/2017 9:15 AM Medical Record Number: [id number] Patient Account Number: [id number] Date of Birth/Sex: June 02, 1926 (84 y.o. Female) Treating RN: Carolyne Fiscal, Debi Primary Care Olman Yono: Cranford Mon, Delfino Lovett Other Clinician: Referring Serenah Mill: Cranford Mon, RICHARD Treating Keaunna Skipper/Extender: Melburn Hake, HOYT Weeks in Treatment: 5 Clinic Level of Care Assessment Items TOOL 4 Quantity Score X - Use when only an EandM is performed on FOLLOW-UP visit 1 0 ASSESSMENTS - Nursing Assessment /  Reassessment X - Reassessment of Co-morbidities (includes updates in patient status) 1 10 X- 1 5 Reassessment of Adherence to Treatment Plan ASSESSMENTS - Wound and Skin Assessment / Reassessment X - Simple Wound Assessment / Reassessment - one wound 1 5 []  - 0 Complex Wound Assessment / Reassessment - multiple wounds []  - 0 Dermatologic / Skin Assessment (not related to wound area) ASSESSMENTS - Focused Assessment []  - Circumferential Edema Measurements - multi extremities 0 []  - 0 Nutritional Assessment / Counseling / Intervention []  - 0 Lower Extremity Assessment (monofilament, tuning fork, pulses) []  - 0 Peripheral Arterial Disease Assessment (using hand held doppler) ASSESSMENTS - Ostomy and/or Continence Assessment and Care []  - Incontinence Assessment and Management 0 []  - 0 Ostomy Care Assessment and Management (repouching, etc.) PROCESS - Coordination of Care []  - Simple Patient / Family Education for ongoing care 0 X- 1 20 Complex (extensive) Patient / Family Education for ongoing care X- 1 10 Staff obtains Programmer, systems, Records, Test Results / Process Orders X- 1 10 Staff telephones HHA, Nursing Homes / Clarify orders / etc []  - 0 Routine Transfer to another Facility (non-emergent condition) []  - 0 Routine Hospital Admission (non-emergent condition) []  - 0 New Admissions / Biomedical engineer / Ordering NPWT, Apligraf, etc. []  - 0 Emergency Hospital Admission (emergent condition) X- 1 10 Simple Discharge Coordination Kujala, Marilene M. ([id number]) []  - 0 Complex (extensive) Discharge Coordination PROCESS - Special Needs []  - Pediatric / Minor Patient Management 0 []  - 0 Isolation Patient Management []  - 0 Hearing / Language / Visual special needs []  - 0 Assessment of Community assistance (transportation, D/C planning, etc.) []  - 0 Additional assistance / Altered mentation []  - 0 Support Surface(s) Assessment (bed, cushion, seat, etc.) INTERVENTIONS -  Wound Cleansing / Measurement X - Simple Wound Cleansing - one wound 1 5 []  - 0 Complex Wound Cleansing - multiple wounds X- 1 5 Wound Imaging (photographs - any number  of wounds) []  - 0 Wound Tracing (instead of photographs) X- 1 5 Simple Wound Measurement - one wound []  - 0 Complex Wound Measurement - multiple wounds INTERVENTIONS - Wound Dressings X - Small Wound Dressing one or multiple wounds 1 10 []  - 0 Medium Wound Dressing one or multiple wounds []  - 0 Large Wound Dressing one or multiple wounds X- 1 5 Application of Medications - topical []  - 0 Application of Medications - injection INTERVENTIONS - Miscellaneous []  - External ear exam 0 []  - 0 Specimen Collection (cultures, biopsies, blood, body fluids, etc.) []  - 0 Specimen(s) / Culture(s) sent or taken to Lab for analysis []  - 0 Patient Transfer (multiple staff / Civil Service fast streamer / Similar devices) []  - 0 Simple Staple / Suture removal (25 or less) []  - 0 Complex Staple / Suture removal (26 or more) []  - 0 Hypo / Hyperglycemic Management (close monitor of Blood Glucose) []  - 0 Ankle / Brachial Index (ABI) - do not check if billed separately X- 1 5 Vital Signs Goodness, Damesha M. ([id number]) Has the patient been seen at the hospital within the last three years: Yes Total Score: 105 Level Of Care: New/Established - Level 3 Electronic Signature(s) Signed: 10/11/2017 4:30:35 PM By: Alric Quan Entered By: Alric Quan on 10/10/2017 11:01:17 Barreiro, Zadaya M. ([id number]) -------------------------------------------------------------------------------- Encounter Discharge Information Details Patient Name: [name] Date of Service: 10/10/2017 9:15 AM Medical Record Number: [id number] Patient Account Number: [id number] Date of Birth/Sex: 18-Feb-1926 (84 y.o. Female) Treating RN: Carolyne Fiscal, Debi Primary Care Isabelle Matt: Cranford Mon, Delfino Lovett Other Clinician: Referring Dawnell Bryant: Wilhemena Durie Treating Anas Reister/Extender: Melburn Hake, HOYT Weeks in Treatment: 5 Encounter Discharge Information Items Discharge Pain Level: 0 Discharge Condition: Stable Ambulatory Status: Unstable Discharge Destination: Home Transportation: Other Accompanied By: daughter Schedule Follow-up Appointment: Yes Medication Reconciliation completed and No provided to Patient/Care Samaira Holzworth: Provided on Clinical Summary of Care: 10/10/2017 Form Type Recipient Paper Patient LB Electronic Signature(s) Signed: 10/13/2017 5:56:09 PM By: Roger Shelter Entered By: Roger Shelter on 10/10/2017 09:28:06 Forti, Henrietta M. ([id number]) -------------------------------------------------------------------------------- Lower Extremity Assessment Details Patient Name: ARLISA, LECLERE. Date of Service: 10/10/2017 9:15 AM Medical Record Number: [id number] Patient Account Number: [id number] Date of Birth/Sex: [birth year] (84 y.o. Female) Treating RN: Montey Hora Primary Care Bhavesh Vazquez: Cranford Mon, Delfino Lovett Other Clinician: Referring Zoraya Fiorenza: Cranford Mon, Delfino Lovett Treating Angelena Sand/Extender: Melburn Hake, HOYT Weeks in Treatment: 5 Vascular Assessment Pulses: Posterior Tibial Extremity colors, hair growth, and conditions: Hair Growth on Extremity: [Right:No] Temperature of Extremity: [Right:Warm] Electronic Signature(s) Signed: 10/10/2017 4:46:41 PM By: Montey Hora Entered By: Montey Hora on 10/10/2017 08:49:35 Vanwey, Rabiah M. ([id number]) -------------------------------------------------------------------------------- Multi Wound Chart Details Patient Name: RHYEN, MAZARIEGO. Date of Service: 10/10/2017 9:15 AM Medical Record Number: [id number] Patient Account Number: [id number] Date of Birth/Sex: 20-Sep-1925 (84 y.o. 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] Secondary Etiology: [3:Cyst] [N/A:N/A] Comorbid History: [3:Cataracts, Glaucoma, Anemia, N/A Hypertension, Type II Diabetes, Osteoarthritis] Date Acquired: [3:12/19/2016] [N/A:N/A] Weeks of Treatment: [3:5] [N/A:N/A] Wound Status: [3:Open] [N/A:N/A] Measurements L x W x D [3:0.5x0.5x2.3] [N/A:N/A] (cm) Area (cm) : [3:0.196] [N/A:N/A] Volume (cm) : [3:0.452] [  N/A:N/A] % Reduction in Area: [3:64.40%] [N/A:N/A] % Reduction in Volume: [3:-721.80%] [N/A:N/A] Classification: [3:Grade 2] [N/A:N/A] Exudate Amount: [3:Large] [N/A:N/A] Exudate Type: [3:Purulent] [N/A:N/A] Exudate Color: [3:yellow, brown, green] [N/A:N/A] Wound Margin: [3:Well defined, not attached] [N/A:N/A] Granulation Amount: [3:Large (67-100%)] [N/A:N/A] Granulation Quality: [3:Red] [N/A:N/A] Necrotic Amount: [3:None Present (0%)] [N/A:N/A] Exposed Structures: [3:Fat Layer (Subcutaneous Tissue) Exposed: Yes Fascia: No Tendon: No Muscle: No Joint: No Bone: No] [N/A:N/A] Epithelialization: [3:None] [N/A:N/A] Periwound Skin Texture: [3:Excoriation: No Induration: No Callus: No] [N/A:N/A] Crepitus: No Rash: No Scarring: No Periwound Skin Moisture: Maceration: No N/A N/A Dry/Scaly: No Periwound Skin Color: Hemosiderin Staining: Yes N/A N/A Atrophie Blanche: No Cyanosis: No Ecchymosis: No Erythema: No Mottled: No Pallor: No Rubor: No Temperature: No Abnormality N/A N/A Tenderness on Palpation: Yes N/A N/A Wound Preparation: Ulcer Cleansing: N/A N/A Rinsed/Irrigated with Saline Topical Anesthetic Applied: Other:  lidocaine 4% Treatment Notes Electronic Signature(s) Signed: 10/11/2017 4:30:35 PM By: Alric Quan Entered By: Alric Quan on 10/10/2017 09:04:37 Busk, Felishia M. ([id number]) -------------------------------------------------------------------------------- [place] Details Patient Name: LAKELY, ELMENDORF. Date of Service: 10/10/2017 9:15 AM Medical Record Number: [id number] Patient Account Number: [id number] Date of Birth/Sex: Feb 12, 1926 (84 y.o. Female) Treating RN: Carolyne Fiscal, Debi Primary Care Dameisha Tschida: Cranford Mon, Delfino Lovett Other Clinician: Referring Aasiya Creasey: Wilhemena Durie Treating Redell Bhandari/Extender: Melburn Hake, HOYT Weeks in Treatment: 5 Active Inactive ` Abuse / Safety / Falls / Self Care Management Nursing Diagnoses: Potential for falls Goals: Patient will remain injury free related to falls Date Initiated: 09/05/2017 Target Resolution Date: 11/18/2017 Goal Status: Active Interventions: Assess fall risk on admission and as needed Notes: ` Orientation to the Wound Care Program Nursing Diagnoses: Knowledge deficit related to the wound healing center program Goals: Patient/caregiver will verbalize understanding of the [place] Date Initiated: 09/05/2017 Target Resolution Date: 11/18/2017 Goal Status: Active Interventions: Provide education on orientation to the wound center Notes: ` Wound/Skin Impairment Nursing Diagnoses: Impaired tissue integrity Goals: Ulcer/skin breakdown will heal within 14 weeks Date Initiated: 09/05/2017 Target Resolution Date: 11/18/2017 Goal Status: Active Interventions: Shackett, Rasa M. ([id number]) Assess patient/caregiver ability to obtain necessary supplies Assess patient/caregiver ability to perform ulcer/skin care regimen upon admission and as needed Assess ulceration(s) every visit Notes: Electronic Signature(s) Signed: 10/11/2017 4:30:35 PM By: Alric Quan Entered By:  Alric Quan on 10/10/2017 09:04:31 Halbert, Antigone M. ([id number]) -------------------------------------------------------------------------------- Pain Assessment Details Patient Name: VALLI, RANDOL. Date of Service: 10/10/2017 9:15 AM Medical Record Number: [id number] Patient Account Number: [id number] Date of Birth/Sex: Jun 05, 1926 (84 y.o. Female) Treating RN: Montey Hora Primary Care Mikahla Wisor: Cranford Mon, Delfino Lovett Other Clinician: Referring Shakora Nordquist: Wilhemena Durie Treating May Ozment/Extender: Melburn Hake, HOYT Weeks in Treatment: 5 Active Problems Location of Pain Severity and Description of Pain Patient Has Paino No Site Locations With Dressing Change: Yes Duration of the Pain. Constant / Intermittento Intermittent Pain Management and Medication Current Pain Management: Goals for Pain Management hurts with dressing change only Notes Topical or injectable lidocaine is offered to patient for acute pain when surgical debridement is performed. If needed, Patient is instructed to use over the counter pain medication for the following 24-48 hours after debridement. Wound care MDs do not prescribed pain medications. Patient has chronic pain or uncontrolled pain. Patient has been instructed to make an appointment with their Primary Care Physician for pain management. Electronic Signature(s) Signed: 10/10/2017 4:46:41 PM By: Montey Hora Entered By: Montey Hora on 10/10/2017 08:44:51 [place], Macaela M. ([id number]) -------------------------------------------------------------------------------- Patient/Caregiver Education Details Patient Name: CLEDITH, KAMIYA. Date of Service:  10/10/2017 9:15 AM Medical Record Number: [id number] Patient Account Number: [id number] Date of Birth/Gender: 07-May-1926 (84 y.o. Female) Treating RN: Roger Shelter Primary Care Physician: Cranford Mon, Delfino Lovett Other Clinician: Referring Physician: Wilhemena Durie Treating  Physician/Extender: Sharalyn Ink in Treatment: 5 Education Assessment Education Provided To: Patient and Caregiver Education Topics Provided Wound/Skin Impairment: Methods: Explain/Verbal Responses: State content correctly Electronic Signature(s) Signed: 10/13/2017 5:56:09 PM By: Roger Shelter Entered By: Roger Shelter on 10/10/2017 09:28:19 Spruce, Eunique M. ([id number]) -------------------------------------------------------------------------------- Wound Assessment Details Patient Name: CARLISLE, TORGESON. Date of Service: 10/10/2017 9:15 AM Medical Record Number: [id number] Patient Account Number: [id number] Date of Birth/Sex: 15-Oct-1925 (84 y.o. Female) Treating RN: Montey Hora Primary Care Janielle Mittelstadt: Cranford Mon, Delfino Lovett Other Clinician: Referring Dearra Myhand: Cranford Mon, Delfino Lovett Treating Severus Brodzinski/Extender: Melburn Hake, HOYT Weeks in Treatment: 5 Wound Status Wound Number: 3 Primary Diabetic Wound/Ulcer of the Lower Extremity Etiology: Wound Location: Right Upper Leg - Anterior Secondary Cyst Wounding Event: Bump Etiology: Date Acquired: 12/19/2016 Wound Status: Open Weeks Of Treatment: 5 Comorbid Cataracts, Glaucoma, Anemia, Hypertension, Clustered Wound: No History: Type II Diabetes, Osteoarthritis Photos Photo Uploaded By: Montey Hora on 10/10/2017 09:12:28 Wound Measurements Length: (cm) 0.5 Width: (cm) 0.5 Depth: (cm) 2.3 Area: (cm) 0.196 Volume: (cm) 0.452 % Reduction in Area: 64.4% % Reduction in Volume: -721.8% Epithelialization: None Tunneling: No Undermining: No Wound Description Classification: Grade 2 Wound Margin: Well defined, not attached Exudate Amount: Large Exudate Type: Purulent Exudate Color: yellow, brown, green Foul Odor After Cleansing: No Slough/Fibrino No Wound Bed Granulation Amount: Large (67-100%) Exposed Structure Granulation Quality: Red Fascia Exposed: No Necrotic Amount: None Present (0%) Fat Layer  (Subcutaneous Tissue) Exposed: Yes Tendon Exposed: No Muscle Exposed: No Joint Exposed: No Bone Exposed: No Periwound Skin Texture Mcquire, Reema M. ([id number]) Texture Color No Abnormalities Noted: No No Abnormalities Noted: No Callus: No Atrophie Blanche: No Crepitus: No Cyanosis: No Excoriation: No Ecchymosis: No Induration: No Erythema: No Rash: No Hemosiderin Staining: Yes Scarring: No Mottled: No Pallor: No Moisture Rubor: No No Abnormalities Noted: No Dry / Scaly: No Temperature / Pain Maceration: No Temperature: No Abnormality Tenderness on Palpation: Yes Wound Preparation Ulcer Cleansing: Rinsed/Irrigated with Saline Topical Anesthetic Applied: Other: lidocaine 4%, Treatment Notes Wound #3 (Right, Anterior Upper Leg) 1. Cleansed with: Clean wound with Normal Saline 2. Anesthetic Topical Lidocaine 4% cream to wound bed prior to debridement 4. Dressing Applied: Iodoform packing Gauze 5. Secondary [place]Neil Signature(s) Signed: 10/10/2017 4:46:41 PM By: Montey Hora Entered By: Montey Hora on 10/10/2017 08:49:21 [place], Shanina M. ([id number]) -------------------------------------------------------------------------------- Vitals Details Patient Name: FREDRICK, DRAY. Date of Service: 10/10/2017 9:15 AM Medical Record Number: [id number] Patient Account Number: [id number] Date of Birth/Sex: [birth year] (84 y.o. Female) Treating RN: Montey Hora Primary Care Delaynie Stetzer: Cranford Mon, Delfino Lovett Other Clinician: Referring Berniece Abid: Cranford Mon, Delfino Lovett Treating Derak Schurman/Extender: Melburn Hake, HOYT Weeks in Treatment: 5 Vital Signs Time Taken: 08:45 Temperature (F): 98.2 Height (in): 63 Pulse (bpm): 60 Weight (lbs): 145 Respiratory Rate (breaths/min): 18 Body Mass Index (BMI): 25.7 Blood Pressure (mmHg): 148/80 Reference Range: 80 - 120 mg / dl Electronic Signature(s) Signed: 10/10/2017 4:46:41 PM By: Montey Hora Entered By: Montey Hora on 10/10/2017 08:45:39

## 2017-10-16 DIAGNOSIS — I10 Essential (primary) hypertension: Secondary | ICD-10-CM | POA: Diagnosis not present

## 2017-10-16 DIAGNOSIS — E11622 Type 2 diabetes mellitus with other skin ulcer: Secondary | ICD-10-CM | POA: Diagnosis not present

## 2017-10-16 DIAGNOSIS — Z791 Long term (current) use of non-steroidal anti-inflammatories (NSAID): Secondary | ICD-10-CM | POA: Diagnosis not present

## 2017-10-16 DIAGNOSIS — Z7982 Long term (current) use of aspirin: Secondary | ICD-10-CM | POA: Diagnosis not present

## 2017-10-16 DIAGNOSIS — L97112 Non-pressure chronic ulcer of right thigh with fat layer exposed: Secondary | ICD-10-CM | POA: Diagnosis not present

## 2017-10-16 DIAGNOSIS — Z96641 Presence of right artificial hip joint: Secondary | ICD-10-CM | POA: Diagnosis not present

## 2017-10-16 DIAGNOSIS — Z794 Long term (current) use of insulin: Secondary | ICD-10-CM | POA: Diagnosis not present

## 2017-10-17 ENCOUNTER — Ambulatory Visit: Payer: Medicare Other | Admitting: Physician Assistant

## 2017-10-19 ENCOUNTER — Ambulatory Visit
Admission: RE | Admit: 2017-10-19 | Discharge: 2017-10-19 | Disposition: A | Discharge status: Home / Self Care | Payer: Medicare Other | Source: Ambulatory Visit | Attending: Physician Assistant | Admitting: Physician Assistant

## 2017-10-19 DIAGNOSIS — Z7982 Long term (current) use of aspirin: Secondary | ICD-10-CM | POA: Diagnosis not present

## 2017-10-19 DIAGNOSIS — L97112 Non-pressure chronic ulcer of right thigh with fat layer exposed: Secondary | ICD-10-CM | POA: Insufficient documentation

## 2017-10-19 DIAGNOSIS — Z791 Long term (current) use of non-steroidal anti-inflammatories (NSAID): Secondary | ICD-10-CM | POA: Diagnosis not present

## 2017-10-19 DIAGNOSIS — Z794 Long term (current) use of insulin: Secondary | ICD-10-CM | POA: Diagnosis not present

## 2017-10-19 DIAGNOSIS — Z471 Aftercare following joint replacement surgery: Secondary | ICD-10-CM | POA: Diagnosis not present

## 2017-10-19 DIAGNOSIS — Z96641 Presence of right artificial hip joint: Secondary | ICD-10-CM | POA: Diagnosis not present

## 2017-10-19 DIAGNOSIS — I10 Essential (primary) hypertension: Secondary | ICD-10-CM | POA: Diagnosis not present

## 2017-10-19 DIAGNOSIS — E11622 Type 2 diabetes mellitus with other skin ulcer: Secondary | ICD-10-CM | POA: Diagnosis not present

## 2017-10-19 MED ORDER — GADOBENATE DIMEGLUMINE 529 MG/ML IV SOLN
6.0000 mL | Freq: Once | INTRAVENOUS | Status: AC | PRN
  Administered 2017-10-19: 6 mL via INTRAVENOUS

## 2017-10-24 ENCOUNTER — Encounter: Payer: Medicare Other | Attending: Physician Assistant | Admitting: Physician Assistant

## 2017-10-24 DIAGNOSIS — E11622 Type 2 diabetes mellitus with other skin ulcer: Secondary | ICD-10-CM | POA: Insufficient documentation

## 2017-10-24 DIAGNOSIS — Z96641 Presence of right artificial hip joint: Secondary | ICD-10-CM | POA: Insufficient documentation

## 2017-10-24 DIAGNOSIS — I10 Essential (primary) hypertension: Secondary | ICD-10-CM | POA: Diagnosis not present

## 2017-10-24 DIAGNOSIS — M1990 Unspecified osteoarthritis, unspecified site: Secondary | ICD-10-CM | POA: Insufficient documentation

## 2017-10-24 DIAGNOSIS — L97112 Non-pressure chronic ulcer of right thigh with fat layer exposed: Secondary | ICD-10-CM | POA: Diagnosis not present

## 2017-10-24 DIAGNOSIS — Z794 Long term (current) use of insulin: Secondary | ICD-10-CM | POA: Insufficient documentation

## 2017-10-24 DIAGNOSIS — E1121 Type 2 diabetes mellitus with diabetic nephropathy: Secondary | ICD-10-CM | POA: Diagnosis not present

## 2017-10-24 DIAGNOSIS — Z951 Presence of aortocoronary bypass graft: Secondary | ICD-10-CM | POA: Insufficient documentation

## 2017-10-26 DIAGNOSIS — L97112 Non-pressure chronic ulcer of right thigh with fat layer exposed: Secondary | ICD-10-CM | POA: Diagnosis not present

## 2017-10-26 DIAGNOSIS — Z7982 Long term (current) use of aspirin: Secondary | ICD-10-CM | POA: Diagnosis not present

## 2017-10-26 DIAGNOSIS — Z794 Long term (current) use of insulin: Secondary | ICD-10-CM | POA: Diagnosis not present

## 2017-10-26 DIAGNOSIS — I10 Essential (primary) hypertension: Secondary | ICD-10-CM | POA: Diagnosis not present

## 2017-10-26 DIAGNOSIS — E11622 Type 2 diabetes mellitus with other skin ulcer: Secondary | ICD-10-CM | POA: Diagnosis not present

## 2017-10-26 DIAGNOSIS — Z791 Long term (current) use of non-steroidal anti-inflammatories (NSAID): Secondary | ICD-10-CM | POA: Diagnosis not present

## 2017-10-26 DIAGNOSIS — Z96641 Presence of right artificial hip joint: Secondary | ICD-10-CM | POA: Diagnosis not present

## 2017-10-26 NOTE — Progress Notes (Signed)
Twombly, Araceli M. ([id number]) Visit Report for 10/24/2017 Arrival Information Details Patient Name: Gabriela [name], [name] Date of Service: 10/24/2017 9:15 AM Medical Record Number: [id number] Patient Account Number: [id number] Date of Birth/Sex: [birth year] (84 y.o. Female) Treating RN: Montey Hora Primary Care Prateek Knipple: Cranford Mon, Delfino Lovett Other Clinician: Referring Cortez Steelman: Cranford Mon, Delfino Lovett Treating Jamis Kryder/Extender: Melburn Hake, HOYT Weeks in Treatment: 7 Visit Information History Since Last Visit Added or deleted any medications: No Patient Arrived: Walker Any new allergies or adverse reactions: No Arrival Time: 09:43 Had a fall or experienced change in No Accompanied By: dtr activities of daily living that may affect Transfer Assistance: None risk of falls: Patient Identification Verified: Yes Signs or symptoms of abuse/neglect since last visito No Secondary Verification Process Yes Hospitalized since last visit: No Completed: Has Dressing in Place as Prescribed: Yes Patient Has Alerts: Yes Pain Present Now: No Patient Alerts: DMII ABI Friendly BILATERAL >220 Electronic Signature(s) Signed: 10/24/2017 4:54:11 PM By: Montey Hora Entered By: Montey Hora on 10/24/2017 09:45:46 Bacci, Stephanee M. ([id number]) -------------------------------------------------------------------------------- Clinic Level of Care Assessment Details Patient Name: Gabriela [name], [name] Date of Service: 10/24/2017 9:15 AM Medical Record Number: [id number] Patient Account Number: [id number] Date of Birth/Sex: 08-22-1925 (84 y.o. Female) Treating RN: Carolyne Fiscal, Debi Primary Care Domenique Quest: Cranford Mon, Delfino Lovett Other Clinician: Referring Lamarion Mcevers: Cranford Mon, RICHARD Treating Dominick Morella/Extender: Melburn Hake, HOYT Weeks in Treatment: 7 Clinic Level of Care Assessment Items TOOL 4 Quantity Score X - Use when only an EandM is performed on FOLLOW-UP visit 1 0 ASSESSMENTS - Nursing Assessment / Reassessment X  - Reassessment of Co-morbidities (includes updates in patient status) 1 10 X- 1 5 Reassessment of Adherence to Treatment Plan ASSESSMENTS - Wound and Skin Assessment / Reassessment X - Simple Wound Assessment / Reassessment - one wound 1 5 []  - 0 Complex Wound Assessment / Reassessment - multiple wounds []  - 0 Dermatologic / Skin Assessment (not related to wound area) ASSESSMENTS - Focused Assessment []  - Circumferential Edema Measurements - multi extremities 0 []  - 0 Nutritional Assessment / Counseling / Intervention []  - 0 Lower Extremity Assessment (monofilament, tuning fork, pulses) []  - 0 Peripheral Arterial Disease Assessment (using hand held doppler) ASSESSMENTS - Ostomy and/or Continence Assessment and Care []  - Incontinence Assessment and Management 0 []  - 0 Ostomy Care Assessment and Management (repouching, etc.) PROCESS - Coordination of Care []  - Simple Patient / Family Education for ongoing care 0 X- 1 20 Complex (extensive) Patient / Family Education for ongoing care X- 1 10 Staff obtains Programmer, systems, Records, Test Results / Process Orders X- 1 10 Staff telephones HHA, Nursing Homes / Clarify orders / etc []  - 0 Routine Transfer to another Facility (non-emergent condition) []  - 0 Routine Hospital Admission (non-emergent condition) []  - 0 New Admissions / Biomedical engineer / Ordering NPWT, Apligraf, etc. []  - 0 Emergency Hospital Admission (emergent condition) X- 1 10 Simple Discharge Coordination Yackley, Malana M. ([id number]) []  - 0 Complex (extensive) Discharge Coordination PROCESS - Special Needs []  - Pediatric / Minor Patient Management 0 []  - 0 Isolation Patient Management []  - 0 Hearing / Language / Visual special needs []  - 0 Assessment of Community assistance (transportation, D/C planning, etc.) []  - 0 Additional assistance / Altered mentation []  - 0 Support Surface(s) Assessment (bed, cushion, seat, etc.) INTERVENTIONS - Wound Cleansing  / Measurement X - Simple Wound Cleansing - one wound 1 5 []  - 0 Complex Wound Cleansing - multiple wounds X- 1 5 Wound Imaging (photographs - any number  of wounds) []  - 0 Wound Tracing (instead of photographs) X- 1 5 Simple Wound Measurement - one wound []  - 0 Complex Wound Measurement - multiple wounds INTERVENTIONS - Wound Dressings X - Small Wound Dressing one or multiple wounds 1 10 []  - 0 Medium Wound Dressing one or multiple wounds []  - 0 Large Wound Dressing one or multiple wounds X- 1 5 Application of Medications - topical []  - 0 Application of Medications - injection INTERVENTIONS - Miscellaneous []  - External ear exam 0 []  - 0 Specimen Collection (cultures, biopsies, blood, body fluids, etc.) []  - 0 Specimen(s) / Culture(s) sent or taken to Lab for analysis []  - 0 Patient Transfer (multiple staff / Civil Service fast streamer / Similar devices) []  - 0 Simple Staple / Suture removal (25 or less) []  - 0 Complex Staple / Suture removal (26 or more) []  - 0 Hypo / Hyperglycemic Management (close monitor of Blood Glucose) []  - 0 Ankle / Brachial Index (ABI) - do not check if billed separately []  - 0 Vital Signs Savitt, Illyria M. ([id number]) Has the patient been seen at the hospital within the last three years: Yes Total Score: 100 Level Of Care: New/Established - Level 3 Electronic Signature(s) Signed: 10/25/2017 8:38:15 AM By: Alric Quan Entered By: Alric Quan on 10/24/2017 11:48:40 Kronenberger, Elloise M. ([id number]) -------------------------------------------------------------------------------- Encounter Discharge Information Details Patient Name: Gabriela [name], [name] Date of Service: 10/24/2017 9:15 AM Medical Record Number: [id number] Patient Account Number: [id number] Date of Birth/Sex: 04/05/1926 (84 y.o. Female) Treating RN: Carolyne Fiscal, Debi Primary Care Desirai Traxler: Cranford Mon, Delfino Lovett Other Clinician: Referring Earon Rivest: Wilhemena Durie Treating  Kyilee Gregg/Extender: Melburn Hake, HOYT Weeks in Treatment: 7 Encounter Discharge Information Items Discharge Pain Level: 0 Discharge Condition: Stable Ambulatory Status: Walker Discharge Destination: Home Transportation: Private Auto Accompanied By: daughter Schedule Follow-up Appointment: Yes Medication Reconciliation completed and No provided to Patient/Care Kera Deacon: Provided on Clinical Summary of Care: 10/24/2017 Form Type Recipient Paper Patient LB Electronic Signature(s) Signed: 10/25/2017 8:28:46 AM By: Roger Shelter Entered By: Roger Shelter on 10/24/2017 10:40:39 Wellborn, Selina M. ([id number]) -------------------------------------------------------------------------------- Lower Extremity Assessment Details Patient Name: Gabriela [name], [name] Date of Service: 10/24/2017 9:15 AM Medical Record Number: [id number] Patient Account Number: [id number] Date of Birth/Sex: 07-20-1926 (84 y.o. Female) Treating RN: Montey Hora Primary Care Maridel Pixler: Cranford Mon, Delfino Lovett Other Clinician: Referring Ilhan Debenedetto: Cranford Mon, RICHARD Treating Lovelace Cerveny/Extender: Melburn Hake, HOYT Weeks in Treatment: 7 Vascular Assessment Pulses: Posterior Tibial Popliteal Palpable: [Right:Yes] Extremity colors, hair growth, and conditions: Temperature of Extremity: [Right:Warm] Electronic Signature(s) Signed: 10/24/2017 4:54:11 PM By: Montey Hora Entered By: Montey Hora on 10/24/2017 09:53:52 Nez, Cymone M. ([id number]) -------------------------------------------------------------------------------- Multi Wound Chart Details Patient Name: Gabriela [name], [name] Date of Service: 10/24/2017 9:15 AM Medical Record Number: [id number] Patient Account Number: [id number] Date of Birth/Sex: 1925-12-05 (84 y.o. 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] Secondary Etiology: [3:Cyst] [N/A:N/A] Comorbid History: [3:Cataracts, Glaucoma, Anemia, N/A Hypertension, Type II Diabetes, Osteoarthritis] Date Acquired: [3:12/19/2016] [N/A:N/A] Weeks of Treatment: [3:7] [N/A:N/A] Wound Status: [3:Open] [N/A:N/A] Measurements L x W x D [3:0.3x0.3x2.1] [N/A:N/A] (cm) Area (cm) : [3:0.071] [N/A:N/A] Volume (cm) : [3:0.148] [N/A:N/A] %  Reduction in Area: [3:87.10%] [N/A:N/A] % Reduction in Volume: [3:-169.10%] [N/A:N/A] Classification: [3:Grade 2] [N/A:N/A] Exudate Amount: [3:Large] [N/A:N/A] Exudate Type: [3:Serous] [N/A:N/A] Exudate Color: [3:amber] [N/A:N/A] Wound Margin: [3:Well defined, not attached] [N/A:N/A] Granulation Amount: [3:Large (67-100%)] [N/A:N/A] Granulation Quality: [3:Red] [N/A:N/A] Necrotic Amount: [3:None Present (0%)] [N/A:N/A] Exposed Structures: [3:Fat Layer (Subcutaneous Tissue) Exposed: Yes Fascia: No Tendon: No Muscle: No Joint: No Bone: No] [N/A:N/A] Epithelialization: [3:None] [N/A:N/A] Periwound Skin Texture: [3:Excoriation: No Induration: No Callus: No] [N/A:N/A] Crepitus: No Rash: No Scarring: No Periwound Skin Moisture: Maceration: No N/A N/A Dry/Scaly: No Periwound Skin Color: Hemosiderin Staining: Yes N/A N/A Atrophie Blanche: No Cyanosis: No Ecchymosis: No Erythema: No Mottled: No Pallor: No Rubor: No Temperature: No Abnormality N/A N/A Tenderness on Palpation: Yes N/A N/A Wound Preparation: Ulcer Cleansing: N/A N/A Rinsed/Irrigated with Saline Topical Anesthetic Applied: Other: lidocaine 4% Treatment  Notes Electronic Signature(s) Signed: 10/25/2017 8:38:15 AM By: Alric Quan Entered By: Alric Quan on 10/24/2017 10:03:34 Mose, Aiza M. ([id number]) -------------------------------------------------------------------------------- [place] Details Patient Name: Gabriela [name], [name] Date of Service: 10/24/2017 9:15 AM Medical Record Number: [id number] Patient Account Number: [id number] Date of Birth/Sex: 15-Jan-1926 (84 y.o. Female) Treating RN: Carolyne Fiscal, Debi Primary Care Tilly Pernice: Cranford Mon, Delfino Lovett Other Clinician: Referring Anwar Sakata: Wilhemena Durie Treating Amea Mcphail/Extender: Melburn Hake, HOYT Weeks in Treatment: 7 Active Inactive ` Abuse / Safety / Falls / Self Care Management Nursing Diagnoses: Potential for falls Goals: Patient will remain injury free related to falls Date Initiated: 09/05/2017 Target Resolution Date: 11/18/2017 Goal Status: Active Interventions: Assess fall risk on admission and as needed Notes: ` Orientation to the Wound Care Program Nursing Diagnoses: Knowledge deficit related to the wound healing center program Goals: Patient/caregiver will verbalize understanding of the [place] Date Initiated: 09/05/2017 Target Resolution Date: 11/18/2017 Goal Status: Active Interventions: Provide education on orientation to the wound center Notes: ` Wound/Skin Impairment Nursing Diagnoses: Impaired tissue integrity Goals: Ulcer/skin breakdown will heal within 14 weeks Date Initiated: 09/05/2017 Target Resolution Date: 11/18/2017 Goal Status: Active Interventions: Sibert, Ardice M. ([id number]) Assess patient/caregiver ability to obtain necessary supplies Assess patient/caregiver ability to perform ulcer/skin care regimen upon admission and as needed Assess ulceration(s) every visit Notes: Electronic Signature(s) Signed: 10/25/2017 8:38:15 AM By: Alric Quan Entered By: Alric Quan on 10/24/2017  10:03:19 Ahuja, Kennidee M. ([id number]) -------------------------------------------------------------------------------- Pain Assessment Details Patient Name: Gabriela [name], [name] Date of Service: 10/24/2017 9:15 AM Medical Record Number: [id number] Patient Account Number: [id number] Date of Birth/Sex: Jan 02, 1926 (84 y.o. Female) Treating RN: Montey Hora Primary Care Channing Savich: Cranford Mon, Delfino Lovett Other Clinician: Referring Goodwin Kamphaus: Cranford Mon, Delfino Lovett Treating Taegan Haider/Extender: Melburn Hake, HOYT Weeks in Treatment: 7 Active Problems Location of Pain Severity and Description of Pain Patient Has Paino No Site Locations With Dressing Change: Yes Pain Management and Medication Current Pain Management: Notes Topical or injectable lidocaine is offered to patient for acute pain when surgical debridement is performed. If needed, Patient is instructed to use over the counter pain medication for the following 24-48 hours after debridement. Wound care MDs do not prescribed pain medications. Patient has chronic pain or uncontrolled pain. Patient has been instructed to make an appointment with their Primary Care Physician for pain management. Electronic Signature(s) Signed: 10/24/2017 4:54:11 PM By: Montey Hora Entered By: Montey Hora on 10/24/2017 09:45:59 Squillace, Amri M. ([id number]) -------------------------------------------------------------------------------- Patient/Caregiver Education Details Patient Name: Gabriela [name], [name] Date of Service: 10/24/2017 9:15 AM Medical Record Number: [id number] Patient Account Number: [id number] Date of Birth/Gender: 06-22-1926 (84 y.o. Female) Treating RN: Flinchum,  Malachy Mood Primary Care Physician: Cranford Mon, Delfino Lovett Other Clinician: Referring Physician: Cranford Mon, Delfino Lovett Treating Physician/Extender: Sharalyn Ink in Treatment: 7 Education Assessment Education Provided To: Patient Education Topics Provided Wound  Debridement: Handouts: Wound Debridement Methods: Explain/Verbal Responses: State content correctly Wound/Skin Impairment: Handouts: Caring for Your Ulcer Methods: Explain/Verbal Responses: State content correctly Electronic Signature(s) Signed: 10/25/2017 8:28:46 AM By: Roger Shelter Entered By: Roger Shelter on 10/24/2017 10:40:54 Stallbaumer, Tashari M. ([id number]) -------------------------------------------------------------------------------- Wound Assessment Details Patient Name: Gabriela [name], [name] Date of Service: 10/24/2017 9:15 AM Medical Record Number: [id number] Patient Account Number: [id number] Date of Birth/Sex: 1925/12/18 (84 y.o. Female) Treating RN: Montey Hora Primary Care Nancyann Cotterman: Cranford Mon, Delfino Lovett Other Clinician: Referring Vidur Knust: Cranford Mon, Delfino Lovett Treating Brach Birdsall/Extender: Melburn Hake, HOYT Weeks in Treatment: 7 Wound Status Wound Number: 3 Primary Diabetic Wound/Ulcer of the Lower Extremity Etiology: Wound Location: Right Upper Leg - Anterior Secondary Cyst Wounding Event: Bump Etiology: Date Acquired: 12/19/2016 Wound Status: Open Weeks Of Treatment: 7 Comorbid Cataracts, Glaucoma, Anemia, Hypertension, Clustered Wound: No History: Type II Diabetes, Osteoarthritis Photos Photo Uploaded By: Montey Hora on 10/24/2017 12:30:04 Wound Measurements Length: (cm) 0.3 Width: (cm) 0.3 Depth: (cm) 2.1 Area: (cm) 0.071 Volume: (cm) 0.148 % Reduction in Area: 87.1% % Reduction in Volume: -169.1% Epithelialization: None Tunneling: No Undermining: No Wound Description Classification: Grade 2 Wound Margin: Well defined, not attached Exudate Amount: Large Exudate Type: Serous Exudate Color: amber Foul Odor After Cleansing: No Slough/Fibrino No Wound Bed Granulation Amount: Large (67-100%) Exposed Structure Granulation Quality: Red Fascia Exposed: No Necrotic Amount: None Present (0%) Fat Layer (Subcutaneous Tissue) Exposed: Yes Tendon  Exposed: No Muscle Exposed: No Joint Exposed: No Bone Exposed: No Periwound Skin Texture Kolenda, Ronalee M. ([id number]) Texture Color No Abnormalities Noted: No No Abnormalities Noted: No Callus: No Atrophie Blanche: No Crepitus: No Cyanosis: No Excoriation: No Ecchymosis: No Induration: No Erythema: No Rash: No Hemosiderin Staining: Yes Scarring: No Mottled: No Pallor: No Moisture Rubor: No No Abnormalities Noted: No Dry / Scaly: No Temperature / Pain Maceration: No Temperature: No Abnormality Tenderness on Palpation: Yes Wound Preparation Ulcer Cleansing: Rinsed/Irrigated with Saline Topical Anesthetic Applied: Other: lidocaine 4%, Treatment Notes Wound #3 (Right, Anterior Upper Leg) 1. Cleansed with: Clean wound with Normal Saline 2. Anesthetic Topical Lidocaine 4% cream to wound bed prior to debridement 4. Dressing Applied: Iodoflex 5. Secondary Dressing Applied [place] Signature(s) Signed: 10/24/2017 4:54:11 PM By: Montey Hora Entered By: Montey Hora on 10/24/2017 09:53:24 Holleran, Carolynne M. ([id number]) -------------------------------------------------------------------------------- Vitals Details Patient Name: ELIANIS, [name] Date of Service: 10/24/2017 9:15 AM Medical Record Number: [id number] Patient Account Number: [id number] Date of Birth/Sex: 1926-03-04 (84 y.o. Female) Treating RN: Montey Hora Primary Care Kimmerly Lora: Cranford Mon, Delfino Lovett Other Clinician: Referring Pahola Dimmitt: Cranford Mon, Delfino Lovett Treating Shirley Bolle/Extender: Melburn Hake, HOYT Weeks in Treatment: 7 Vital Signs Time Taken: 09:46 Temperature (F): 98.3 Height (in): 63 Pulse (bpm): 64 Weight (lbs): 145 Respiratory Rate (breaths/min): 18 Body Mass Index (BMI): 25.7 Blood Pressure (mmHg): 156/64 Reference Range: 80 - 120 mg / dl Electronic Signature(s) Signed: 10/24/2017 4:54:11 PM By: Montey Hora Entered By: Montey Hora on 10/24/2017  09:47:43

## 2017-10-26 NOTE — Progress Notes (Signed)
Nachtigal, Otto M. ([id number]) Visit Report for 10/24/2017 Chief Complaint Document Details Patient Name: Gabriela [name], [name] Date of Service: 10/24/2017 9:15 AM Medical Record Number: [id number] Patient Account Number: [id number] Date of Birth/Sex: 04/21/1926 (84 y.o. Female) Treating RN: Carolyne Fiscal, Debi Primary Care Provider: Cranford Mon, Delfino Lovett Other Clinician: Referring Provider: Wilhemena Durie Treating Provider/Extender: Melburn Hake, HOYT Weeks in Treatment: 7 Information Obtained from: Patient Chief Complaint She presents in follow up for a right anterior thigh wound/abscess Electronic Signature(s) Signed: 10/25/2017 12:09:36 AM By: Worthy Keeler PA-C Entered By: Worthy Keeler on 10/24/2017 09:42:26 Mallon, Castella M. ([id number]) -------------------------------------------------------------------------------- HPI Details Patient Name: [name] Date of Service: 10/24/2017 9:15 AM Medical Record Number: [id number] Patient Account Number: [id number] Date of Birth/Sex: [birth year] (84 y.o. Female) Treating RN: Carolyne Fiscal, Debi Primary Care Provider: Cranford Mon, Delfino Lovett Other Clinician: Referring Provider: Wilhemena Durie Treating Provider/Extender: Melburn Hake, HOYT Weeks in Treatment: 7 History of Present Illness HPI Description: 84 year old female with right thigh abscess and ulceration of the right thigh noted by her orthopedic surgeon Dr. Kurtis Bushman. The patient has had right total hip arthroplasty with ostial lysis done recently in June 2017 and x-ray done postoperatively looked good. Patient was previously treated with Keflex and Septra. reviewing the orthopedic notes Dr. Sabra Heck had done a IandD of the abscess somewhere in June and at some stage had cauterized hyperbaric granulation tissue which continues to have drainage from the wound. 07/12/17; this is a patient who was seen one time by Dr. Con Memos on 13/04/2017. She did not return to our clinic. She  was dressed at that point with [facility]. I think she has been followed by orthopedics A Dr Harlow Mares and Dr. Sabra Heck of orthopedics for this area since then. He has been using peroxide to the area. The history here is a bit difficult to follow. It sounds as though she had an abscess develop roughly 6 months ago. There was no trauma. Apparently she had an IandD done but I don't see any culture results. X-rays have been done that show her previous total hip replacement to be in good position. Her total hip replacement was done 20 years ago per the patient and she had not had any problems in the interim. She has not had any pain in the area. She has no systemic symptoms including fever or chills. She is a type II diabetic on oral agents and Levemir 09/05/17 patient presents concerning her right thigh separation which has for the most part on inspection today closed over with epithelium although there is a small problematic for her. Nonetheless when she last saw Dr. Dellia Nims on July 12, 2017 he didn't do an incision and drainage and happened to mention that she may need additional testing depending on how things proceeded. However she did not come back until just now when I'm seeing her for reevaluation today. With that being said she does not seem to have any severe pain. She does continue to have drainage from the site and it has never really fully cleared and closed out as would be appropriate for a wound of this nature. Some of this may be due to the fact that she is not having appropriate follow-up and so we are not able to truly have this granulated appropriately. With that being said it does seem that she may have something more deeply draining and causing problems at this point. The good news is she's not having as much pain as previous. Patient is seen along  with her granddaughter in the office at this point in time. The issue that seems to be underlying this wound was explained to both  patient as well as her granddaughter No fevers, chills, nausea, or vomiting noted at this time. Again this sounds to have started initially has managed by Dr. Sabra Heck according to patient as an abscess that unfortunately has had a very difficult time healing. After reviewing the notes here further we may need to consider an MRI depending on how she is progressing at the next follow-up. We will see how things are doing. 09/19/17 on evaluation today patient appears to be doing excellent in regard to her right thigh ulcer. She has been tolerating the dressing changes without complication she does seem to be granulating in from the base and there is no deeper tunneling at this point as there was after I initially open this area up. Overall this seems to be doing very well and is feeling much better than it did last time around I do believe. Fortunately she has no significant discomfort in fact she has no pain at all except one we are probing around in the base of the wound. We are using Prisma currently. 10/10/17 on evaluation today patient appears to be doing a little worse in regard to her right thigh ulcer. We did receive a call last week from home health nurse due to the fact that the wound had opened up more significantly and was tunneling at the 11 o'clock to 12 o'clock location. Subsequently we switch to packing this with Iodoform gauze until patient was seen today in follow-up. She does not seem to be having any significant discomfort in regard to the ulcer. Nonetheless there has been pus drainage noted although during the evaluation today I was not able to visualize this personally. She does have no evidence of erythema surrounding the wound at this point. 10/24/17 on evaluation today patient appears to be doing a little better in my opinion concerning her right thigh ulcer. Her daughter is present during the evaluation today this is the first time that I have met her daughter. She did want to be  present for review of the MRI findings. I had previously ordered an MRI of the right femur as well as the pelvis to evaluate for any evidence of abscess or potential osteomyelitis that could be leading to a sinus tract in this right thigh ulcer due to the fact that the tunneling at roughly the 11 to 1 o'clock location did not seem to have an end when I was probing during my last evaluation Herman, Stephaniemarie M. ([id number]) on October 10, 2017. Subsequently the good news is the MRIs did not show any evidence of abscess, osteomyelitis, active infection, or issues obviously with the previous hip replacement. With that being said the good news is on probing today I was able to actually get an actual depth to the tunnel whereas on last evaluation this tunnel continued as far as I can probe and further. Hopefully this is a good sign and this one is starting to really fill in. Obviously we have been fooled before and that is the reason that she is still with [place] under care and treatment at this point. There is no evidence of purulent discharge or infection. Electronic Signature(s) Signed: 10/25/2017 12:09:36 AM By: Worthy Keeler PA-C Entered By: Worthy Keeler on 10/24/2017 19:00:29 Hailu, Amazing M. ([id number]) -------------------------------------------------------------------------------- Physical Exam Details Patient Name: STEPHANIA, MACFARLANE. Date of Service: 10/24/2017 9:15 AM  Medical Record Number: [id number] Patient Account Number: [id number] Date of Birth/Sex: 25-Feb-1926 (84 y.o. Female) Treating RN: Carolyne Fiscal, Debi Primary Care Provider: Cranford Mon, Delfino Lovett Other Clinician: Referring Provider: Cranford Mon, RICHARD Treating Provider/Extender: Melburn Hake, HOYT Weeks in Treatment: 7 Constitutional Well-nourished and well-hydrated in no acute distress. Respiratory normal breathing without difficulty. clear to auscultation bilaterally. Cardiovascular regular rate and rhythm with normal S1,  S2. Psychiatric this patient is able to make decisions and demonstrates good insight into disease process. Alert and Oriented x 3. pleasant and cooperative. Notes Patient has a small wound opening in the right anterior thigh which leads to a tunnel between the 12-1 o'clock location there is terracing when this drainage noted at this point in time fortunately no purulent drainage noted. Again her MRI studies appeared to be good on evaluation today. This was shown to the patient's daughter during discussion today. I explained to her as well that the good news is I am actually able to obtain a depth to the tunnel which I was not able to do previously and hopefully this is an indication that the wound is beginning to really fill in from the base outward. Electronic Signature(s) Signed: 10/25/2017 12:09:36 AM By: Worthy Keeler PA-C Entered By: Worthy Keeler on 10/24/2017 19:02:24 Neth, Miyana M. ([id number]) -------------------------------------------------------------------------------- Physician Orders Details Patient Name: DASJA, BRASE. Date of Service: 10/24/2017 9:15 AM Medical Record Number: [id number] Patient Account Number: [id number] Date of Birth/Sex: 06/02/1926 (84 y.o. 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 o Cleanse wound with mild soap and water o May Shower, gently  pat wound dry prior to applying new dressing. Anesthetic (add to Medication List) Wound #3 Right,Anterior Upper Leg o Topical Lidocaine 4% cream applied to wound bed prior to debridement (In Clinic Only). Primary Wound Dressing Wound #3 Right,Anterior Upper Leg o Iodoform packing Gauze - 1/4 " inch may have to cut in 1/2 Secondary Dressing Wound #3 Right,Anterior Upper Leg o [place] Frequency Wound #3 Right,Anterior Upper Leg o Change dressing every other day. Follow-up Appointments Wound #3 Right,Anterior Upper Leg o Return Appointment in 1 week. Additional Orders / Instructions Wound #3 Right,Anterior Upper Leg o Increase protein intake. Home Health Wound #3 Right,Anterior Upper Leg Guymon, Latoyia M. ([id number]) o [place] Nurse may visit PRN to address patientos wound care needs. o FACE TO FACE ENCOUNTER: MEDICARE and MEDICAID PATIENTS: I certify that this patient is under my care and that I had a face-to-face encounter that meets the physician face-to-face encounter requirements with this patient on this date. The encounter with the patient was in whole or in part for the following MEDICAL CONDITION: (primary reason for [place]) MEDICAL NECESSITY: I certify, that based on my findings, NURSING services are a medically necessary home health service. HOME BOUND STATUS: I certify that my clinical findings support that this patient is homebound (i.e., Due to illness or injury, pt requires aid of supportive devices such as crutches, cane, wheelchairs, walkers,  the use of special transportation or the assistance of another person to leave their place of residence. There is a normal inability to leave the home and doing so requires considerable and taxing effort. Other absences are for medical reasons / religious services and are infrequent or of short duration when for other reasons). o If current  dressing causes regression in wound condition, may D/C ordered dressing product/s and apply Normal Saline Moist Dressing daily until next [place] / Other MD appointment. [place] of regression in wound condition at [phone]. o Please direct any NON-WOUND related issues/requests for orders to patient's Primary Care Physician Patient Medications Allergies: Sulfa (Sulfonamide Antibiotics) Notifications Medication Indication Start End lidocaine DOSE 1 - topical 4 % cream - 1 cream topical Electronic Signature(s) Signed: 10/25/2017 12:09:36 AM By: Worthy Keeler PA-C Signed: 10/25/2017 8:38:15 AM By: Alric Quan Entered By: Alric Quan on 10/24/2017 10:22:41 Bram, Rayann M. ([id number]) -------------------------------------------------------------------------------- Prescription 10/24/2017 Patient Name: SARITA, HAKANSON. Provider: Worthy Keeler PA-C Date of Birth: 1925/10/15 NPI#: [id number] Sex: F DEA#: [id number] Phone #: [phone] License #: Patient Address: [facility] [place], [place] [address] [address], [place], [place] [address] [phone] Allergies Sulfa (Sulfonamide Antibiotics) Medication Medication: Route: Strength: Form: lidocaine 4 % topical cream topical 4% cream Class: TOPICAL LOCAL ANESTHETICS Dose: Frequency / Time: Indication: 1 1 cream topical Number of Refills: Number of Units: 0 Generic Substitution: Start Date: End Date: One Time Use: Substitution Permitted No Note to Pharmacy: Signature(s): Date(s): Electronic Signature(s) Signed: 10/25/2017 12:09:36 AM By: Worthy Keeler PA-C Signed: 10/25/2017 8:38:15 AM By: Alric Quan Entered By: Alric Quan on 10/24/2017 10:22:42 Macnaughton, Leilana M.  ([id number]) --------------------------------------------------------------------------------  Problem List Details Patient Name: MIKEYA, TOMASETTI. Date of Service: 10/24/2017 9:15 AM Medical Record Number: [id number] Patient Account Number: [id number] Date of Birth/Sex: April 30, 1926 (84 y.o. 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 ([id number]) -------------------------------------------------------------------------------- Progress Note Details Patient Name: MIRCA, YALE. Date of Service: 10/24/2017 9:15 AM Medical Record Number: [id number] Patient Account Number: [id number] Date of Birth/Sex: Jun 24, 1926 (84 y.o. Female) Treating RN: Carolyne Fiscal, Debi Primary Care Provider: Cranford Mon, Delfino Lovett Other Clinician: Referring Provider: Wilhemena Durie Treating Provider/Extender: Melburn Hake, HOYT Weeks in Treatment: 7 Subjective Chief Complaint Information obtained from Patient She presents in follow up for a right anterior thigh wound/abscess History of Present Illness (HPI) 84 year old female with right thigh abscess and ulceration of the right thigh noted by her orthopedic surgeon Dr. Kurtis Bushman. The patient has had right total hip arthroplasty with ostial lysis done recently in June 2017  and x-ray done postoperatively looked good. Patient was previously treated with Keflex and Septra. reviewing the orthopedic notes Dr. Sabra Heck had done a IandD of the abscess somewhere in June and at some stage had cauterized hyperbaric granulation tissue which continues to have drainage from the wound. 07/12/17;  this is a patient who was seen one time by Dr. Con Memos on 13/04/2017. She did not return to our clinic. She was dressed at that point with [facility]. I think she has been followed by orthopedics A Dr Harlow Mares and Dr. Sabra Heck of orthopedics for this area since then. He has been using peroxide to the area. The history here is a bit difficult to follow. It sounds as though she had an abscess develop roughly 6 months ago. There was no trauma. Apparently she had an IandD done but I don't see any culture results. X-rays have been done that show her previous total hip replacement to be in good position. Her total hip replacement was done 20 years ago per the patient and she had not had any problems in the interim. She has not had any pain in the area. She has no systemic symptoms including fever or chills. She is a type II diabetic on oral agents and Levemir 09/05/17 patient presents concerning her right thigh separation which has for the most part on inspection today closed over with epithelium although there is a small problematic for her. Nonetheless when she last saw Dr. Dellia Nims on July 12, 2017 he didn't do an incision and drainage and happened to mention that she may need additional testing depending on how things proceeded. However she did not come back until just now when I'm seeing her for reevaluation today. With that being said she does not seem to have any severe pain. She does continue to have drainage from the site and it has never really fully cleared and closed out as would be appropriate for a wound of this nature. Some of this may be due to the fact that she is not having  appropriate follow-up and so we are not able to truly have this granulated appropriately. With that being said it does seem that she may have something more deeply draining and causing problems at this point. The good news is she's not having as much pain as previous. Patient is seen along with her granddaughter in the office at this point in time. The issue that seems to be underlying this wound was explained to both patient as well as her granddaughter No fevers, chills, nausea, or vomiting noted at this time. Again this sounds to have started initially has managed by Dr. Sabra Heck according to patient as an abscess that unfortunately has had a very difficult time healing. After reviewing the notes here further we may need to consider an MRI depending on how she is progressing at the next follow-up. We will see how things are doing. 09/19/17 on evaluation today patient appears to be doing excellent in regard to her right thigh ulcer. She has been tolerating the dressing changes without complication she does seem to be granulating in from the base and there is no deeper tunneling at this point as there was after I initially open this area up. Overall this seems to be doing very well and is feeling much better than it did last time around I do believe. Fortunately she has no significant discomfort in fact she has no pain at all except one we are probing around in the base of the wound. We are using Prisma currently. 10/10/17 on evaluation today patient appears to be doing a little worse in regard to her right thigh ulcer. We did receive a call last week from home health nurse due to the fact that the wound had opened up more significantly and was  tunneling at the 11 o'clock to 12 o'clock location. Subsequently we switch to packing this with Iodoform gauze until patient was seen today in follow-up. She does not seem to be having any significant discomfort in regard to the ulcer. Nonetheless there has been  pus drainage noted although during the evaluation today I was not able to visualize this personally. She does have no evidence of erythema surrounding the wound at this point. Calandra, Teosha M. ([id number]) 10/24/17 on evaluation today patient appears to be doing a little better in my opinion concerning her right thigh ulcer. Her daughter is present during the evaluation today this is the first time that I have met her daughter. She did want to be present for review of the MRI findings. I had previously ordered an MRI of the right femur as well as the pelvis to evaluate for any evidence of abscess or potential osteomyelitis that could be leading to a sinus tract in this right thigh ulcer due to the fact that the tunneling at roughly the 11 to 1 o'clock location did not seem to have an end when I was probing during my last evaluation on October 10, 2017. Subsequently the good news is the MRIs did not show any evidence of abscess, osteomyelitis, active infection, or issues obviously with the previous hip replacement. With that being said the good news is on probing today I was able to actually get an actual depth to the tunnel whereas on last evaluation this tunnel continued as far as I can probe and further. Hopefully this is a good sign and this one is starting to really fill in. Obviously we have been fooled before and that is the reason that she is still with [place] under care and treatment at this point. There is no evidence of purulent discharge or infection. Patient History Information obtained from Patient. Family History Cancer - Child, Diabetes - Child, Heart Disease - Child, No family history of Hypertension, Kidney Disease, Lung Disease, Seizures, Stroke, Thyroid Problems, Tuberculosis. Social History Never smoker, Marital Status - Widowed, Alcohol Use - Never, Drug Use - No History, Caffeine Use - Daily. Medical And Surgical History Notes Constitutional Symptoms (General Health) High  Blood Pressure, Diabetes, Aspirin for heart, fluid pil Cardiovascular Heart Bypass Review of Systems (ROS) Constitutional Symptoms (General Health) Denies complaints or symptoms of Fever, Chills. Respiratory The patient has no complaints or symptoms. Cardiovascular The patient has no complaints or symptoms. Psychiatric The patient has no complaints or symptoms. Objective Constitutional Well-nourished and well-hydrated in no acute distress. Vitals Time Taken: 9:46 AM, Height: 63 in, Weight: 145 lbs, BMI: 25.7, Temperature: 98.3 F, Pulse: 64 bpm, Respiratory Rate: 18 breaths/min, Blood Pressure: 156/64 mmHg. Respiratory normal breathing without difficulty. clear to auscultation bilaterally. Rullo, Wrenley M. ([id number]) Cardiovascular regular rate and rhythm with normal S1, S2. Psychiatric this patient is able to make decisions and demonstrates good insight into disease process. Alert and Oriented x 3. pleasant and cooperative. General Notes: Patient has a small wound opening in the right anterior thigh which leads to a tunnel between the 12-1 o'clock location there is terracing when this drainage noted at this point in time fortunately no purulent drainage noted. Again her MRI studies appeared to be good on evaluation today. This was shown to the patient's daughter during discussion today. I explained to her as well that the good news is I am actually able to obtain a depth to the tunnel which I was not able to do previously and hopefully  this is an indication that the wound is beginning to really fill in from the base outward. Integumentary (Hair, Skin) Wound #3 status is Open. Original cause of wound was Bump. The wound is located on the Right,Anterior Upper Leg. The wound measures 0.3cm length x 0.3cm width x 2.1cm depth; 0.071cm^2 area and 0.148cm^3 volume. There is Fat Layer (Subcutaneous Tissue) Exposed exposed. There is no tunneling or undermining noted. There is a large  amount of serous drainage noted. The wound margin is well defined and not attached to the wound base. There is large (67-100%) red granulation within the wound bed. There is no necrotic tissue within the wound bed. The periwound skin appearance exhibited: Hemosiderin Staining. The periwound skin appearance did not exhibit: Callus, Crepitus, Excoriation, Induration, Rash, Scarring, Dry/Scaly, Maceration, Atrophie Blanche, Cyanosis, Ecchymosis, Mottled, Pallor, Rubor, Erythema. Periwound temperature was noted as No Abnormality. The periwound has tenderness on palpation. 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 Cleanse wound with mild soap and water May Shower, gently pat wound dry prior to applying new dressing. Anesthetic (add to Medication List): Wound #3 Right,Anterior Upper Leg: Topical Lidocaine 4% cream applied to wound bed prior to debridement (In Clinic Only). Primary Wound Dressing: Wound #3 Right,Anterior Upper Leg: Iodoform packing Gauze - 1/4 " inch may have to cut in 1/2 Secondary Dressing: Wound #3 Right,Anterior Upper Leg: Momon, Sharniece M. ([id number]) [place] Dressing Change Frequency: Wound #3 Right,Anterior Upper Leg: Change dressing every other day. Follow-up Appointments: Wound #3 Right,Anterior Upper Leg: Return Appointment in 1 week. Additional Orders / Instructions: Wound #3 Right,Anterior Upper Leg: Increase protein intake. Home Health: Wound #3 Right,Anterior Upper Leg: Continue Home Health Visits Home Health Nurse may visit PRN to address patient s wound care needs. FACE TO FACE ENCOUNTER: MEDICARE and MEDICAID PATIENTS: I certify that  this patient is under my care and that I had a face-to-face encounter that meets the physician face-to-face encounter requirements with this patient on this date. The encounter with the patient was in whole or in part for the following MEDICAL CONDITION: (primary reason for [place]) MEDICAL NECESSITY: I certify, that based on my findings, NURSING services are a medically necessary home health service. HOME BOUND STATUS: I certify that my clinical findings support that this patient is homebound (i.e., Due to illness or injury, pt requires aid of supportive devices such as crutches, cane, wheelchairs, walkers, the use of special transportation or the assistance of another person to leave their place of residence. There is a normal inability to leave the home and doing so requires considerable and taxing effort. Other absences are for medical reasons / religious services and are infrequent or of short duration when for other reasons). If current dressing causes regression in wound condition, may D/C ordered dressing product/s and apply Normal Saline Moist Dressing daily until next [place] / Other MD appointment. [place] of regression in wound condition at [phone]. Please direct any NON-WOUND related issues/requests for orders to patient's Primary Care Physician The following medication(s) was prescribed: lidocaine topical 4 % cream 1 1 cream topical was prescribed at facility I am going to recommend that we continue to pack with the Iodoform quarter inch gauze cut in half  utilizing a skinny probe. This actually helped today by reducing the pain she states that when we pack this with a normal size Q-tip or rather when home health does that this causes much more discomfort than what she experienced today which was minimal to nothing. We did send some of the skinny probe's home with her as well to use going forward. Hopefully this will allow. To be packed in  the fluid to be whipped away so that we do not have fluid collections and a stepping back in the progress going forward. We will see were things stand in one weeks time. Please see above for specific wound care orders. We will see patient for re-evaluation in 1 week(s) here in the clinic. If anything worsens or changes patient will contact our office for additional recommendations. Electronic Signature(s) Signed: 10/25/2017 12:09:36 AM By: Worthy Keeler PA-C Entered By: Worthy Keeler on 10/24/2017 19:03:45 Aracena, Donnetta M. ([id number]) -------------------------------------------------------------------------------- ROS/PFSH Details Patient Name: MOLLYE, GUINTA. Date of Service: 10/24/2017 9:15 AM Medical Record Number: [id number] Patient Account Number: [id number] Date of Birth/Sex: 11-09-1925 (84 y.o. Female) Treating RN: Carolyne Fiscal, Debi Primary Care Provider: Cranford Mon, Delfino Lovett Other Clinician: Referring Provider: [place] Mon, Delfino Lovett Treating Provider/Extender: Melburn Hake, HOYT Weeks in Treatment: 7 Information Obtained From Patient Wound History Do you currently have one or more open woundso Yes How many open wounds do you currently haveo 1 Approximately how long have you had your woundso 5 months How have you been treating your wound(s) until nowo ointment Has your wound(s) ever healed and then re-openedo No Have you had any lab work done in the past montho No Have you tested positive for an antibiotic resistant organism (MRSA, VRE)o No Have you tested positive for osteomyelitis (bone infection)o No Have you had any tests for circulation on your legso No Constitutional Symptoms (General Health) Complaints and Symptoms: Negative for: Fever; Chills Medical History: Past Medical History Notes: High Blood Pressure, Diabetes, Aspirin for heart, fluid pil Eyes Medical History: Positive for: Cataracts - removed; Glaucoma Negative for: Optic  Neuritis Ear/Nose/Mouth/Throat Medical History: Negative for: Chronic sinus problems/congestion; Middle ear problems Hematologic/Lymphatic Medical History: Positive for: Anemia Negative for: Hemophilia; Human Immunodeficiency Virus; Lymphedema; Sickle Cell Disease Respiratory Complaints and Symptoms: No Complaints or Symptoms Medical History: Negative for: Aspiration; Asthma; Chronic Obstructive Pulmonary Disease (COPD); Pneumothorax; Sleep Apnea; Tuberculosis Cardiovascular Jenison, Ramisa M. ([id number]) Complaints and Symptoms: No Complaints or Symptoms Medical History: Positive for: Hypertension Negative for: Angina; Arrhythmia; Congestive Heart Failure; Coronary Artery Disease; Deep Vein Thrombosis; Hypotension; Myocardial Infarction; Peripheral Arterial Disease; Peripheral Venous Disease; Phlebitis; Vasculitis Past Medical History Notes: Heart Bypass Gastrointestinal Medical History: Negative for: Cirrhosis ; Colitis; Crohnos; Hepatitis A; Hepatitis C Endocrine Medical History: Positive for: Type II Diabetes Negative for: Type I Diabetes Time with diabetes: 22 Treated with: Insulin Blood sugar tested every day: No Genitourinary Medical History: Negative for: End Stage Renal Disease Immunological Medical History: Negative for: Lupus Erythematosus; Raynaudos; Scleroderma Integumentary (Skin) Medical History: Negative for: History of Burn; History of pressure wounds Musculoskeletal Medical History: Positive for: Osteoarthritis Negative for: Gout; Rheumatoid Arthritis; Osteomyelitis Neurologic Medical History: Negative for: Dementia; Neuropathy; Quadriplegia; Paraplegia; Seizure Disorder Oncologic Medical History: Negative for: Received Chemotherapy; Received Radiation Psychiatric Complaints and Symptoms: No Complaints or Symptoms Sardo, Alayja M. ([id number]) Medical History: Negative for: Anorexia/bulimia; Confinement Anxiety HBO Extended History  Items Eyes: Eyes: Cataracts Glaucoma Immunizations Pneumococcal Vaccine: Received Pneumococcal Vaccination: Yes Implantable Devices Family and Social History Cancer: Yes - Child; Diabetes:  Yes - Child; Heart Disease: Yes - Child; Hypertension: No; Kidney Disease: No; Lung Disease: No; Seizures: No; Stroke: No; Thyroid Problems: No; Tuberculosis: No; Never smoker; Marital Status - Widowed; Alcohol Use: Never; Drug Use: No History; Caffeine Use: Daily; Advanced Directives: No; Patient does not want information on Advanced Directives; Living Will: No; Medical Power of Attorney: No Physician Affirmation I have reviewed and agree with the above information. Electronic Signature(s) Signed: 10/25/2017 12:09:36 AM By: Worthy Keeler PA-C Signed: 10/25/2017 8:38:15 AM By: Alric Quan Entered By: Worthy Keeler on 10/24/2017 19:01:57 Cardozo, Wiletta M. ([id number]) -------------------------------------------------------------------------------- SuperBill Details Patient Name: FARRYN, LINARES. Date of Service: 10/24/2017 Medical Record Number: [id number] Patient Account Number: [id number] Date of Birth/Sex: 1926/05/23 (84 y.o. 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 Facility Procedures CPT4 Code: [id number] Description: 99213 - WOUND CARE VISIT-LEV 3 EST PT Modifier: Quantity: 1 Physician Procedures CPT4 Code: 5075732 Description: 25672 - WC PHYS LEVEL 3 - EST PT ICD-10 Diagnosis Description E11.622 Type 2 diabetes mellitus with other skin ulcer L97.112  Non-pressure chronic ulcer of right thigh with fat layer e I10 Essential (primary) hypertension E11.21 Type 2  diabetes mellitus with diabetic nephropathy Modifier: xposed Quantity: 1 Electronic Signature(s) Signed: 10/25/2017 12:09:36 AM By: Worthy Keeler PA-C Entered By: Worthy Keeler on 10/24/2017 19:04:04

## 2017-10-30 DIAGNOSIS — L97112 Non-pressure chronic ulcer of right thigh with fat layer exposed: Secondary | ICD-10-CM | POA: Diagnosis not present

## 2017-10-30 DIAGNOSIS — Z791 Long term (current) use of non-steroidal anti-inflammatories (NSAID): Secondary | ICD-10-CM | POA: Diagnosis not present

## 2017-10-30 DIAGNOSIS — Z96641 Presence of right artificial hip joint: Secondary | ICD-10-CM | POA: Diagnosis not present

## 2017-10-30 DIAGNOSIS — E11622 Type 2 diabetes mellitus with other skin ulcer: Secondary | ICD-10-CM | POA: Diagnosis not present

## 2017-10-30 DIAGNOSIS — Z7982 Long term (current) use of aspirin: Secondary | ICD-10-CM | POA: Diagnosis not present

## 2017-10-30 DIAGNOSIS — I10 Essential (primary) hypertension: Secondary | ICD-10-CM | POA: Diagnosis not present

## 2017-10-30 DIAGNOSIS — Z794 Long term (current) use of insulin: Secondary | ICD-10-CM | POA: Diagnosis not present

## 2017-10-31 ENCOUNTER — Encounter: Payer: Medicare Other | Admitting: Physician Assistant

## 2017-10-31 DIAGNOSIS — E1121 Type 2 diabetes mellitus with diabetic nephropathy: Secondary | ICD-10-CM | POA: Diagnosis not present

## 2017-10-31 DIAGNOSIS — L97112 Non-pressure chronic ulcer of right thigh with fat layer exposed: Secondary | ICD-10-CM | POA: Diagnosis not present

## 2017-10-31 DIAGNOSIS — Z96641 Presence of right artificial hip joint: Secondary | ICD-10-CM | POA: Diagnosis not present

## 2017-10-31 DIAGNOSIS — E11622 Type 2 diabetes mellitus with other skin ulcer: Secondary | ICD-10-CM | POA: Diagnosis not present

## 2017-10-31 DIAGNOSIS — I10 Essential (primary) hypertension: Secondary | ICD-10-CM | POA: Diagnosis not present

## 2017-10-31 DIAGNOSIS — Z951 Presence of aortocoronary bypass graft: Secondary | ICD-10-CM | POA: Diagnosis not present

## 2017-10-31 DIAGNOSIS — Z794 Long term (current) use of insulin: Secondary | ICD-10-CM | POA: Diagnosis not present

## 2017-10-31 DIAGNOSIS — M1990 Unspecified osteoarthritis, unspecified site: Secondary | ICD-10-CM | POA: Diagnosis not present

## 2017-11-01 NOTE — Progress Notes (Signed)
Theriault, Chanin M. ([id number]) Visit Report for 10/31/2017 Arrival Information Details Patient Name: Gabriela [name], [name] Date of Service: 10/31/2017 9:15 AM Medical Record Number: [id number] Patient Account Number: [id number] Date of Birth/Sex: 03-22-1926 (84 y.o. Female) Treating RN: Montey Hora Primary Care Spencer Cardinal: Cranford Mon, Delfino Lovett Other Clinician: Referring Ajamu Maxon: Cranford Mon, Delfino Lovett Treating Jenni Thew/Extender: Melburn Hake, HOYT Weeks in Treatment: 8 Visit Information History Since Last Visit Added or deleted any medications: No Patient Arrived: Walker Any new allergies or adverse reactions: No Arrival Time: 09:38 Had a fall or experienced change in No Accompanied By: self activities of daily living that may affect Transfer Assistance: None risk of falls: Patient Identification Verified: Yes Signs or symptoms of abuse/neglect since last visito No Secondary Verification Process Yes Hospitalized since last visit: No Completed: Has Dressing in Place as Prescribed: Yes Patient Has Alerts: Yes Pain Present Now: No Patient Alerts: DMII ABI [place] BILATERAL >220 Electronic Signature(s) Signed: 10/31/2017 11:44:57 AM By: Montey Hora Entered By: Montey Hora on 10/31/2017 09:42:49 [name], [name] ([id number]) -------------------------------------------------------------------------------- Clinic Level of Care Assessment Details Patient Name: Gabriela [name], [name] Date of Service: 10/31/2017 9:15 AM Medical Record Number: [id number] Patient Account Number: [id number] Date of Birth/Sex: Sep 23, 1925 (84 y.o. Female) Treating RN: Montey Hora Primary Care Leor Whyte: Cranford Mon, Delfino Lovett Other Clinician: Referring Kayler Buckholtz: Cranford Mon, RICHARD Treating Marlisha Vanwyk/Extender: Melburn Hake, HOYT Weeks in Treatment: 8 Clinic Level of Care Assessment Items TOOL 4 Quantity Score []  - Use when only an EandM is performed on FOLLOW-UP visit 0 ASSESSMENTS - Nursing Assessment / Reassessment X  - Reassessment of Co-morbidities (includes updates in patient status) 1 10 X- 1 5 Reassessment of Adherence to Treatment Plan ASSESSMENTS - Wound and Skin Assessment / Reassessment X - Simple Wound Assessment / Reassessment - one wound 1 5 []  - 0 Complex Wound Assessment / Reassessment - multiple wounds []  - 0 Dermatologic / Skin Assessment (not related to wound area) ASSESSMENTS - Focused Assessment []  - Circumferential Edema Measurements - multi extremities 0 []  - 0 Nutritional Assessment / Counseling / Intervention []  - 0 Lower Extremity Assessment (monofilament, tuning fork, pulses) []  - 0 Peripheral Arterial Disease Assessment (using hand held doppler) ASSESSMENTS - Ostomy and/or Continence Assessment and Care []  - Incontinence Assessment and Management 0 []  - 0 Ostomy Care Assessment and Management (repouching, etc.) PROCESS - Coordination of Care X - Simple Patient / Family Education for ongoing care 1 15 []  - 0 Complex (extensive) Patient / Family Education for ongoing care []  - 0 Staff obtains Programmer, systems, Records, Test Results / Process Orders []  - 0 Staff telephones HHA, Nursing Homes / Clarify orders / etc []  - 0 Routine Transfer to another Facility (non-emergent condition) []  - 0 Routine Hospital Admission (non-emergent condition) []  - 0 New Admissions / Biomedical engineer / Ordering NPWT, Apligraf, etc. []  - 0 Emergency Hospital Admission (emergent condition) X- 1 10 Simple Discharge Coordination [name], [name] ([id number]) []  - 0 Complex (extensive) Discharge Coordination PROCESS - Special Needs []  - Pediatric / Minor Patient Management 0 []  - 0 Isolation Patient Management []  - 0 Hearing / Language / Visual special needs []  - 0 Assessment of Community assistance (transportation, D/C planning, etc.) []  - 0 Additional assistance / Altered mentation []  - 0 Support Surface(s) Assessment (bed, cushion, seat, etc.) INTERVENTIONS - Wound Cleansing  / Measurement X - Simple Wound Cleansing - one wound 1 5 []  - 0 Complex Wound Cleansing - multiple wounds X- 1 5 Wound Imaging (photographs - any number  of wounds) []  - 0 Wound Tracing (instead of photographs) X- 1 5 Simple Wound Measurement - one wound []  - 0 Complex Wound Measurement - multiple wounds INTERVENTIONS - Wound Dressings X - Small Wound Dressing one or multiple wounds 1 10 []  - 0 Medium Wound Dressing one or multiple wounds []  - 0 Large Wound Dressing one or multiple wounds []  - 0 Application of Medications - topical []  - 0 Application of Medications - injection INTERVENTIONS - Miscellaneous []  - External ear exam 0 []  - 0 Specimen Collection (cultures, biopsies, blood, body fluids, etc.) []  - 0 Specimen(s) / Culture(s) sent or taken to Lab for analysis []  - 0 Patient Transfer (multiple staff / Civil Service fast streamer / Similar devices) []  - 0 Simple Staple / Suture removal (25 or less) []  - 0 Complex Staple / Suture removal (26 or more) []  - 0 Hypo / Hyperglycemic Management (close monitor of Blood Glucose) []  - 0 Ankle / Brachial Index (ABI) - do not check if billed separately X- 1 5 Vital Signs [name], [name] ([id number]) Has the patient been seen at the hospital within the last three years: Yes Total Score: 75 Level Of Care: New/Established - Level 2 Electronic Signature(s) Signed: 10/31/2017 11:44:57 AM By: Montey Hora Entered By: Montey Hora on 10/31/2017 10:12:19 [name], [name] ([id number]) -------------------------------------------------------------------------------- Encounter Discharge Information Details Patient Name: Gabriela [name], [name] Date of Service: 10/31/2017 9:15 AM Medical Record Number: [id number] Patient Account Number: [id number] Date of Birth/Sex: 12/15/1925 (84 y.o. Female) Treating RN: Cornell Barman Primary Care Jermya Dowding: Cranford Mon, Delfino Lovett Other Clinician: Referring Mieko Kneebone: Cranford Mon, Delfino Lovett Treating Teigen Parslow/Extender:  Melburn Hake, HOYT Weeks in Treatment: 8 Encounter Discharge Information Items Discharge Pain Level: 0 Discharge Condition: Stable Ambulatory Status: Walker Discharge Destination: Home Transportation: Private Auto Accompanied By: self Schedule Follow-up Appointment: Yes Medication Reconciliation completed and No provided to Patient/Care Angelea Penny: Provided on Clinical Summary of Care: 10/31/2017 Form Type Recipient Paper Patient LB Electronic Signature(s) Signed: 10/31/2017 11:44:57 AM By: Montey Hora Entered By: Montey Hora on 10/31/2017 10:15:12 [name], [name] ([id number]) -------------------------------------------------------------------------------- Lower Extremity Assessment Details Patient Name: Gabriela [name], [name] Date of Service: 10/31/2017 9:15 AM Medical Record Number: [id number] Patient Account Number: [id number] Date of Birth/Sex: June 30, 1926 (84 y.o. Female) Treating RN: Montey Hora Primary Care Maleeyah Mccaughey: Cranford Mon, Delfino Lovett Other Clinician: Referring Ladale Sherburn: Cranford Mon, RICHARD Treating Pierina Schuknecht/Extender: Melburn Hake, HOYT Weeks in Treatment: 8 Electronic Signature(s) Signed: 10/31/2017 11:44:57 AM By: Montey Hora Entered By: Montey Hora on 10/31/2017 09:50:44 [name], Jazzelle M. ([id number]) -------------------------------------------------------------------------------- Multi Wound Chart Details Patient Name: Gabriela [name], [name] Date of Service: 10/31/2017 9:15 AM Medical Record Number: [id number] Patient Account Number: [id number] Date of Birth/Sex: 1926/07/12 (84 y.o. 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] Secondary Etiology: [3:Cyst] [N/A:N/A] Comorbid History: [3:Cataracts, Glaucoma, Anemia, N/A Hypertension, Type II Diabetes, Osteoarthritis] Date Acquired: [3:12/19/2016] [N/A:N/A] Weeks of Treatment: [3:8] [N/A:N/A] Wound Status: [3:Open] [N/A:N/A] Measurements L x W x D [3:0.3x0.3x1.9] [N/A:N/A] (cm) Area (cm) : [3:0.071] [N/A:N/A] Volume (cm) : [3:0.134] [N/A:N/A] % Reduction in Area: [3:87.10%] [N/A:N/A] % Reduction in Volume: [3:-143.60%] [N/A:N/A] Classification: [3:Grade 2] [N/A:N/A] Exudate Amount: [  3:Large] [N/A:N/A] Exudate Type: [3:Serous] [N/A:N/A] Exudate Color: [3:amber] [N/A:N/A] Wound Margin: [3:Well defined, not attached] [N/A:N/A] Granulation Amount: [3:Large (67-100%)] [N/A:N/A] Granulation Quality: [3:Red] [N/A:N/A] Necrotic Amount: [3:None Present (0%)] [N/A:N/A] Exposed Structures: [3:Fat Layer (Subcutaneous Tissue) Exposed: Yes Fascia: No Tendon: No Muscle: No Joint: No Bone: No] [N/A:N/A] Epithelialization: [3:None] [N/A:N/A] Periwound Skin Texture: [3:Excoriation: No Induration: No Callus: No] [N/A:N/A] Crepitus: No Rash: No Scarring: No Periwound Skin Moisture: Maceration: No N/A N/A Dry/Scaly: No Periwound Skin Color: Hemosiderin Staining: Yes N/A N/A Atrophie Blanche: No Cyanosis: No Ecchymosis: No Erythema: No Mottled: No Pallor: No Rubor: No Temperature: No Abnormality N/A N/A Tenderness on Palpation: Yes N/A N/A Wound Preparation: Ulcer Cleansing: N/A N/A Rinsed/Irrigated with Saline Topical Anesthetic Applied: Other: lidocaine 4% Treatment Notes Electronic Signature(s) Signed: 10/31/2017 11:44:57 AM By: Montey Hora Entered By: Montey Hora on 10/31/2017 09:50:56 Verret, [name]  ([id number]) -------------------------------------------------------------------------------- [place] Details Patient Name: Gabriela [name], [name] Date of Service: 10/31/2017 9:15 AM Medical Record Number: [id number] Patient Account Number: [id number] Date of Birth/Sex: 1926/04/22 (84 y.o. Female) Treating RN: Montey Hora Primary Care Latrish Mogel: Cranford Mon, Delfino Lovett Other Clinician: Referring Myangel Summons: Wilhemena Durie Treating Jeryl Umholtz/Extender: Melburn Hake, HOYT Weeks in Treatment: 8 Active Inactive ` Abuse / Safety / Falls / Self Care Management Nursing Diagnoses: Potential for falls Goals: Patient will remain injury free related to falls Date Initiated: 09/05/2017 Target Resolution Date: 11/18/2017 Goal Status: Active Interventions: Assess fall risk on admission and as needed Notes: ` Orientation to the Wound Care Program Nursing Diagnoses: Knowledge deficit related to the wound healing center program Goals: Patient/caregiver will verbalize understanding of the [place] Date Initiated: 09/05/2017 Target Resolution Date: 11/18/2017 Goal Status: Active Interventions: Provide education on orientation to the wound center Notes: ` Wound/Skin Impairment Nursing Diagnoses: Impaired tissue integrity Goals: Ulcer/skin breakdown will heal within 14 weeks Date Initiated: 09/05/2017 Target Resolution Date: 11/18/2017 Goal Status: Active Interventions: [name], [name] ([id number]) Assess patient/caregiver ability to obtain necessary supplies Assess patient/caregiver ability to perform ulcer/skin care regimen upon admission and as needed Assess ulceration(s) every visit Notes: Electronic Signature(s) Signed: 10/31/2017 11:44:57 AM By: Montey Hora Entered By: Montey Hora on 10/31/2017 09:50:48 Lankford, [name] ([id number]) -------------------------------------------------------------------------------- Pain Assessment Details Patient  Name: MARIGOLD, MOM. Date of Service: 10/31/2017 9:15 AM Medical Record Number: [id number] Patient Account Number: [id number] Date of Birth/Sex: 06-Apr-1926 (84 y.o. Female) Treating RN: Montey Hora Primary Care Grant Henkes: Cranford Mon, Delfino Lovett Other Clinician: Referring Azam Gervasi: Cranford Mon, Delfino Lovett Treating Yuvaan Olander/Extender: Melburn Hake, HOYT Weeks in Treatment: 8 Active Problems Location of Pain Severity and Description of Pain Patient Has Paino No Site Locations With Dressing Change: Yes Pain Management and Medication Current Pain Management: Goals for Pain Management "only hurts when it's messed with" Notes Topical or injectable lidocaine is offered to patient for acute pain when surgical debridement is performed. If needed, Patient is instructed to use over the counter pain medication for the following 24-48 hours after debridement. Wound care MDs do not prescribed pain medications. Patient has chronic pain or uncontrolled pain. Patient has been instructed to make an appointment with their Primary Care Physician for pain management. Electronic Signature(s) Signed: 10/31/2017 11:44:57 AM By: Montey Hora Entered By: Montey Hora on 10/31/2017 09:43:10 Matteucci, Hikari M. ([id number]) -------------------------------------------------------------------------------- Patient/Caregiver Education Details Patient Name: DORISE, GANGI. Date of Service: 10/31/2017 9:15 AM Medical Record Number: [id number] Patient Account Number: [id number] Date of Birth/Gender: 04-28-1926 (84 y.o. Female) Treating RN: Montey Hora Primary Care Physician: Cranford Mon, Delfino Lovett  Other Clinician: Referring Physician: Wilhemena Durie Treating Physician/Extender: Sharalyn Ink in Treatment: 8 Education Assessment Education Provided To: Caregiver HHRN via written orders Education Topics Provided Wound/Skin Impairment: Handouts: Other: wound care orders and packing instructions Methods:  Printed Electronic Signature(s) Signed: 10/31/2017 11:44:57 AM By: Montey Hora Entered By: Montey Hora on 10/31/2017 10:15:56 Philyaw, Tarita M. ([id number]) -------------------------------------------------------------------------------- Wound Assessment Details Patient Name: PANDORA, MCCRACKIN. Date of Service: 10/31/2017 9:15 AM Medical Record Number: [id number] Patient Account Number: [id number] Date of Birth/Sex: Dec 19, 1925 (84 y.o. Female) Treating RN: Montey Hora Primary Care Deb Loudin: Cranford Mon, Delfino Lovett Other Clinician: Referring Shane Melby: Cranford Mon, Delfino Lovett Treating Dredyn Gubbels/Extender: Melburn Hake, HOYT Weeks in Treatment: 8 Wound Status Wound Number: 3 Primary Diabetic Wound/Ulcer of the Lower Extremity Etiology: Wound Location: Right Upper Leg - Anterior Secondary Cyst Wounding Event: Bump Etiology: Date Acquired: 12/19/2016 Wound Status: Open Weeks Of Treatment: 8 Comorbid Cataracts, Glaucoma, Anemia, Hypertension, Clustered Wound: No History: Type II Diabetes, Osteoarthritis Photos Photo Uploaded By: Montey Hora on 10/31/2017 10:23:18 Wound Measurements Length: (cm) 0.3 Width: (cm) 0.3 Depth: (cm) 1.9 Area: (cm) 0.071 Volume: (cm) 0.134 % Reduction in Area: 87.1% % Reduction in Volume: -143.6% Epithelialization: None Tunneling: Yes Position (o'clock): 12 Maximum Distance: (cm) 10.8 Undermining: No Wound Description Classification: Grade 2 Wound Margin: Well defined, not attached Exudate Amount: Large Exudate Type: Serous Exudate Color: amber Foul Odor After Cleansing: No Slough/Fibrino No Wound Bed Granulation Amount: Large (67-100%) Exposed Structure Granulation Quality: Red Fascia Exposed: No Necrotic Amount: None Present (0%) Fat Layer (Subcutaneous Tissue) Exposed: Yes Tendon Exposed: No Muscle Exposed: No Joint Exposed: No Biondolillo, Larrie M. ([id number]) Bone Exposed: No Periwound Skin Texture Texture Color No Abnormalities  Noted: No No Abnormalities Noted: No Callus: No Atrophie Blanche: No Crepitus: No Cyanosis: No Excoriation: No Ecchymosis: No Induration: No Erythema: No Rash: No Hemosiderin Staining: Yes Scarring: No Mottled: No Pallor: No Moisture Rubor: No No Abnormalities Noted: No Dry / Scaly: No Temperature / Pain Maceration: No Temperature: No Abnormality Tenderness on Palpation: Yes Wound Preparation Ulcer Cleansing: Rinsed/Irrigated with Saline Topical Anesthetic Applied: Other: lidocaine 4%, Treatment Notes Wound #3 (Right, Anterior Upper Leg) 1. Cleansed with: Clean wound with Normal Saline 2. Anesthetic Topical Lidocaine 4% cream to wound bed prior to debridement 4. Dressing Applied: Iodoform packing Gauze 5. Secondary [place] Notes drawtex Electronic Signature(s) Signed: 10/31/2017 11:44:57 AM By: Montey Hora Entered By: Montey Hora on 10/31/2017 10:01:49 Vandenbosch, Jesi M. ([id number]) -------------------------------------------------------------------------------- Vitals Details Patient Name: DAPHNEE, PREISS. Date of Service: 10/31/2017 9:15 AM Medical Record Number: [id number] Patient Account Number: [id number] Date of Birth/Sex: 13-Nov-1925 (84 y.o. Female) Treating RN: Montey Hora Primary Care Perley Arthurs: Cranford Mon, Delfino Lovett Other Clinician: Referring Kinsler Soeder: Cranford Mon, Delfino Lovett Treating Shiva Sahagian/Extender: Melburn Hake, HOYT Weeks in Treatment: 8 Vital Signs Time Taken: 09:43 Temperature (F): 98.1 Height (in): 63 Pulse (bpm): 62 Weight (lbs): 145 Respiratory Rate (breaths/min): 18 Body Mass Index (BMI): 25.7 Blood Pressure (mmHg): 122/49 Reference Range: 80 - 120 mg / dl Electronic Signature(s) Signed: 10/31/2017 11:44:57 AM By: Montey Hora Entered By: Montey Hora on 10/31/2017 09:43:40

## 2017-11-02 DIAGNOSIS — Z791 Long term (current) use of non-steroidal anti-inflammatories (NSAID): Secondary | ICD-10-CM | POA: Diagnosis not present

## 2017-11-02 DIAGNOSIS — Z794 Long term (current) use of insulin: Secondary | ICD-10-CM | POA: Diagnosis not present

## 2017-11-02 DIAGNOSIS — Z96641 Presence of right artificial hip joint: Secondary | ICD-10-CM | POA: Diagnosis not present

## 2017-11-02 DIAGNOSIS — I10 Essential (primary) hypertension: Secondary | ICD-10-CM | POA: Diagnosis not present

## 2017-11-02 DIAGNOSIS — Z7982 Long term (current) use of aspirin: Secondary | ICD-10-CM | POA: Diagnosis not present

## 2017-11-02 DIAGNOSIS — E11622 Type 2 diabetes mellitus with other skin ulcer: Secondary | ICD-10-CM | POA: Diagnosis not present

## 2017-11-02 DIAGNOSIS — L97112 Non-pressure chronic ulcer of right thigh with fat layer exposed: Secondary | ICD-10-CM | POA: Diagnosis not present

## 2017-11-05 NOTE — Progress Notes (Signed)
[name], [name] ([id number]) Visit Report for 10/31/2017 Chief Complaint Document Details Patient Name: Gabriela [name], [name] Date of Service: 10/31/2017 9:15 AM Medical Record Number: [id number] Patient Account Number: [id number] Date of Birth/Sex: April 04, 1926 (84 y.o. F) Treating RN: Cornell Barman Primary Care Provider: Cranford Mon, Delfino Lovett Other Clinician: Referring Provider: Wilhemena Durie Treating Provider/Extender: Melburn Hake, HOYT Weeks in Treatment: 8 Information Obtained from: Patient Chief Complaint She presents in follow up for a right anterior thigh wound/abscess Electronic Signature(s) Signed: 11/01/2017 4:20:45 AM By: Worthy Keeler PA-C Entered By: Worthy Keeler on 10/31/2017 09:46:57 [name], [name] ([id number]) -------------------------------------------------------------------------------- HPI Details Patient Name: Gabriela [name], [name] Date of Service: 10/31/2017 9:15 AM Medical Record Number: [id number] Patient Account Number: [id number] Date of Birth/Sex: [birth year] (84 y.o. F) Treating RN: Cornell Barman Primary Care Provider: Cranford Mon, Delfino Lovett Other Clinician: Referring Provider: Wilhemena Durie Treating Provider/Extender: Melburn Hake, HOYT Weeks in Treatment: 8 History of Present Illness HPI Description: 84 year old female with right thigh abscess and ulceration of the right thigh noted by her orthopedic surgeon Dr. Kurtis Bushman. The patient has had right total hip arthroplasty with ostial lysis done recently in June 2017 and x-ray done postoperatively looked good. Patient was previously treated with Keflex and Septra. reviewing the orthopedic notes Dr. Sabra Heck had done a IandD of the abscess somewhere in June and at some stage had cauterized hyperbaric granulation tissue which continues to have drainage from the wound. 07/12/17; this is a patient who was seen one time by Dr. Con Memos on 13/04/2017. She did not return to our clinic. She was dressed at that point with  [facility]. I think she has been followed by orthopedics A Dr Harlow Mares and Dr. Sabra Heck of orthopedics for this area since then. He has been using peroxide to the area. The history here is a bit difficult to follow. It sounds as though she had an abscess develop roughly 6 months ago. There was no trauma. Apparently she had an IandD done but I don't see any culture results. X-rays have been done that show her previous total hip replacement to be in good position. Her total hip replacement was done 20 years ago per the patient and she had not had any problems in the interim. She has not had any pain in the area. She has no systemic symptoms including fever or chills. She is a type II diabetic on oral agents and Levemir 09/05/17 patient presents concerning her right thigh separation which has for the most part on inspection today closed over with epithelium although there is a small problematic for her. Nonetheless when she last saw Dr. Dellia Nims on July 12, 2017 he didn't do an incision and drainage and happened to mention that she may need additional testing depending on how things proceeded. However she did not come back until just now when I'm seeing her for reevaluation today. With that being said she does not seem to have any severe pain. She does continue to have drainage from the site and it has never really fully cleared and closed out as would be appropriate for a wound of this nature. Some of this may be due to the fact that she is not having appropriate follow-up and so we are not able to truly have this granulated appropriately. With that being said it does seem that she may have something more deeply draining and causing problems at this point. The good news is she's not having as much pain as previous. Patient is seen along  with her granddaughter in the office at this point in time. The issue that seems to be underlying this wound was explained to both patient as well as her granddaughter  No fevers, chills, nausea, or vomiting noted at this time. Again this sounds to have started initially has managed by Dr. Sabra Heck according to patient as an abscess that unfortunately has had a very difficult time healing. After reviewing the notes here further we may need to consider an MRI depending on how she is progressing at the next follow-up. We will see how things are doing. 09/19/17 on evaluation today patient appears to be doing excellent in regard to her right thigh ulcer. She has been tolerating the dressing changes without complication she does seem to be granulating in from the base and there is no deeper tunneling at this point as there was after I initially open this area up. Overall this seems to be doing very well and is feeling much better than it did last time around I do believe. Fortunately she has no significant discomfort in fact she has no pain at all except one we are probing around in the base of the wound. We are using Prisma currently. 10/10/17 on evaluation today patient appears to be doing a little worse in regard to her right thigh ulcer. We did receive a call last week from home health nurse due to the fact that the wound had opened up more significantly and was tunneling at the 11 o'clock to 12 o'clock location. Subsequently we switch to packing this with Iodoform gauze until patient was seen today in follow-up. She does not seem to be having any significant discomfort in regard to the ulcer. Nonetheless there has been pus drainage noted although during the evaluation today I was not able to visualize this personally. She does have no evidence of erythema surrounding the wound at this point. 10/24/17 on evaluation today patient appears to be doing a little better in my opinion concerning her right thigh ulcer. Her daughter is present during the evaluation today this is the first time that I have met her daughter. She did want to be present for review of the MRI findings.  I had previously ordered an MRI of the right femur as well as the pelvis to evaluate for any evidence of abscess or potential osteomyelitis that could be leading to a sinus tract in this right thigh ulcer due to the fact that the tunneling at roughly the 11 to 1 o'clock location did not seem to have an end when I was probing during my last evaluation Roher, Roquel M. ([id number]) on October 10, 2017. Subsequently the good news is the MRIs did not show any evidence of abscess, osteomyelitis, active infection, or issues obviously with the previous hip replacement. With that being said the good news is on probing today I was able to actually get an actual depth to the tunnel whereas on last evaluation this tunnel continued as far as I can probe and further. Hopefully this is a good sign and this one is starting to really fill in. Obviously we have been fooled before and that is the reason that she is still with [place] under care and treatment at this point. There is no evidence of purulent discharge or infection. 10/31/17 on evaluation today patient continues to have drainage from her right thigh ulcer. This region is still open at this point and she does have an original death as well as a tunnel at 12 o'clock that  still extends more deeply even than the overall depth of the wound. With that being said I do believe that there is no evidence of significant infection at this point time which is good news. I think the biggest issue is just fluid collection as far as the packing is concerned I'm also wondering if we are packing this as well especially in regard to the 12 o'clock location at this point. Utilizing the skinny probe's are better. Patient states she's not had as much pain since we have been performing the packing in that manner using the skinny probe. Electronic Signature(s) Signed: 11/01/2017 4:20:45 AM By: Worthy Keeler PA-C Entered By: Worthy Keeler on 10/31/2017 10:24:32 [name], Gabriela  M. ([id number]) -------------------------------------------------------------------------------- Physical Exam Details Patient Name: Gabriela [name], [name] Date of Service: 10/31/2017 9:15 AM Medical Record Number: [id number] Patient Account Number: [id number] Date of Birth/Sex: Jan 23, 1926 (84 y.o. F) Treating RN: Cornell Barman Primary Care Provider: Cranford Mon, Delfino Lovett Other Clinician: Referring Provider: Cranford Mon, RICHARD Treating Provider/Extender: Melburn Hake, HOYT Weeks in Treatment: 8 Constitutional Well-nourished and well-hydrated in no acute distress. Respiratory normal breathing without difficulty. clear to auscultation bilaterally. Cardiovascular regular rate and rhythm with normal S1, S2. Psychiatric this patient is able to make decisions and demonstrates good insight into disease process. Alert and Oriented x 3. pleasant and cooperative. Notes Patient's wound at this point shows a small opening which continues to drain although I do believe the main issue is the tunnel at 12 o'clock that still has not sealed up. This is definitely an area that we need to be sure to pack and in order to do this the packing strip does have to be cut in half in order to fit even at quarter inch. Electronic Signature(s) Signed: 11/01/2017 4:20:45 AM By: Worthy Keeler PA-C Entered By: Worthy Keeler on 10/31/2017 10:25:13 [name], [name] ([id number]) -------------------------------------------------------------------------------- Physician Orders Details Patient Name: Gabriela [name], [name] Date of Service: 10/31/2017 9:15 AM Medical Record Number: [id number] Patient Account Number: [id number] Date of Birth/Sex: Jan 28, 1926 (84 y.o. 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 o Cleanse wound with mild soap and water o May Shower, gently pat wound dry prior to applying new dressing. Anesthetic (add to Medication List) Wound #3 Right,Anterior Upper Leg o Topical Lidocaine 4% cream applied to wound bed prior to debridement (In Clinic Only). Primary Wound Dressing Wound #3 Right,Anterior Upper Leg o Iodoform packing Gauze - 1/4 " inch may have to cut in 1/2 PLEASE PACK TUNNEL AT 12:00 THAT MEASURES 10.8CM Secondary Dressing Wound #3 Right,Anterior Upper Leg o Drawtex o [place] Dressing Change Frequency Wound #3 Right,Anterior Upper Leg o Change dressing every other day. Follow-up Appointments Wound #3 Right,Anterior Upper Leg o Return Appointment in 1 week. Additional Orders / Instructions Wound #3 Right,Anterior Upper Leg o Increase protein intake. Lalor, Yoshika M. ([id number]) Home Health Wound #3 [place] Visits o Home Health Nurse may visit PRN to address patientos wound care needs. o FACE TO FACE ENCOUNTER: MEDICARE and MEDICAID PATIENTS:  I certify that this patient is under my care and that I had a face-to-face encounter that meets the physician face-to-face encounter requirements with this patient on this date. The encounter with the patient was in whole or in part for the following MEDICAL CONDITION: (primary reason for [place]) MEDICAL NECESSITY: I certify, that based on my findings, NURSING services are a medically necessary home health service. HOME BOUND STATUS: I certify that my clinical findings support that this patient is homebound (i.e., Due to  illness or injury, pt requires aid of supportive devices such as crutches, cane, wheelchairs, walkers, the use of special transportation or the assistance of another person to leave their place of residence. There is a normal inability to leave the home and doing so requires considerable and taxing effort. Other absences are for medical reasons / religious services and are infrequent or of short duration when for other reasons). o If current dressing causes regression in wound condition, may D/C ordered dressing product/s and apply Normal Saline Moist Dressing daily until next [place] / Other MD appointment. [place] of regression in wound condition at [phone]. o Please direct any NON-WOUND related issues/requests for orders to patient's Primary Care Physician Electronic Signature(s) Signed: 10/31/2017 11:44:57 AM By: Montey Hora Signed: 11/01/2017 4:20:45 AM By: Worthy Keeler PA-C Entered By: Montey Hora on 10/31/2017 10:01:15 [name], [name] ([id number]) -------------------------------------------------------------------------------- Problem List Details Patient Name: HEMA, LANZA. Date of Service: 10/31/2017 9:15 AM Medical Record Number: [id number] Patient Account Number: [id number] Date of Birth/Sex: [birth year] (84 y.o. 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 ([id number]) -------------------------------------------------------------------------------- Progress Note Details Patient Name: ZAREEN, JAMISON. Date of Service: 10/31/2017 9:15 AM Medical Record Number: [id number] Patient Account Number: [id number] Date of Birth/Sex: Sep 02, 1925 (84 y.o. F) Treating RN: Cornell Barman Primary Care Provider: Cranford Mon, Delfino Lovett Other Clinician: Referring Provider: Cranford Mon, Delfino Lovett Treating Provider/Extender: Melburn Hake, HOYT Weeks in Treatment: 8 Subjective Chief Complaint Information obtained from Patient She presents in follow up for a right anterior thigh wound/abscess History of Present Illness (HPI) 84 year old female with right thigh abscess and ulceration of the right thigh noted by her orthopedic surgeon Dr. Kurtis Bushman. The patient has had right total hip arthroplasty with ostial lysis done recently in June 2017 and x-ray done postoperatively looked good. Patient was previously treated with Keflex and Septra. reviewing the orthopedic notes Dr. Sabra Heck had done a IandD of the abscess somewhere in June and at some stage had cauterized hyperbaric granulation tissue which continues to have drainage from the wound. 07/12/17; this is a patient who was seen one time by Dr. Con Memos on 13/04/2017. She did not return to our clinic. She was dressed at that point with [facility]. I think she has been followed by orthopedics A Dr Harlow Mares and Dr. Sabra Heck of orthopedics for this area since then. He has been using peroxide to the area. The history  here is a bit difficult to follow. It sounds as though she had an abscess develop roughly 6 months ago. There was no trauma. Apparently she had an IandD done but I don't see any culture results. X-rays have been done that show her previous total hip replacement to be in good position.  Her total hip replacement was done 20 years ago per the patient and she had not had any problems in the interim. She has not had any pain in the area. She has no systemic symptoms including fever or chills. She is a type II diabetic on oral agents and Levemir 09/05/17 patient presents concerning her right thigh separation which has for the most part on inspection today closed over with epithelium although there is a small problematic for her. Nonetheless when she last saw Dr. Dellia Nims on July 12, 2017 he didn't do an incision and drainage and happened to mention that she may need additional testing depending on how things proceeded. However she did not come back until just now when I'm seeing her for reevaluation today. With that being said she does not seem to have any severe pain. She does continue to have drainage from the site and it has never really fully cleared and closed out as would be appropriate for a wound of this nature. Some of this may be due to the fact that she is not having appropriate follow-up and so we are not able to truly have this granulated appropriately. With that being said it does seem that she may have something more deeply draining and causing problems at this point. The good news is she's not having as much pain as previous. Patient is seen along with her granddaughter in the office at this point in time. The issue that seems to be underlying this wound was explained to both patient as well as her granddaughter No fevers, chills, nausea, or vomiting noted at this time. Again this sounds to have started initially has managed by Dr. Sabra Heck according to patient as an abscess that unfortunately has had a very difficult time healing. After reviewing the notes here further we may need to consider an MRI depending on how she is progressing at the next follow-up. We will see how things are doing. 09/19/17 on evaluation today patient appears to be doing excellent in regard to her  right thigh ulcer. She has been tolerating the dressing changes without complication she does seem to be granulating in from the base and there is no deeper tunneling at this point as there was after I initially open this area up. Overall this seems to be doing very well and is feeling much better than it did last time around I do believe. Fortunately she has no significant discomfort in fact she has no pain at all except one we are probing around in the base of the wound. We are using Prisma currently. 10/10/17 on evaluation today patient appears to be doing a little worse in regard to her right thigh ulcer. We did receive a call last week from home health nurse due to the fact that the wound had opened up more significantly and was tunneling at the 11 o'clock to 12 o'clock location. Subsequently we switch to packing this with Iodoform gauze until patient was seen today in follow-up. She does not seem to be having any significant discomfort in regard to the ulcer. Nonetheless there has been pus drainage noted although during the evaluation today I was not able to visualize this personally.  She does have no evidence of erythema surrounding the wound at this point. [place], Rayna M. ([id number]) 10/24/17 on evaluation today patient appears to be doing a little better in my opinion concerning her right thigh ulcer. Her daughter is present during the evaluation today this is the first time that I have met her daughter. She did want to be present for review of the MRI findings. I had previously ordered an MRI of the right femur as well as the pelvis to evaluate for any evidence of abscess or potential osteomyelitis that could be leading to a sinus tract in this right thigh ulcer due to the fact that the tunneling at roughly the 11 to 1 o'clock location did not seem to have an end when I was probing during my last evaluation on October 10, 2017. Subsequently the good news is the MRIs did not show any  evidence of abscess, osteomyelitis, active infection, or issues obviously with the previous hip replacement. With that being said the good news is on probing today I was able to actually get an actual depth to the tunnel whereas on last evaluation this tunnel continued as far as I can probe and further. Hopefully this is a good sign and this one is starting to really fill in. Obviously we have been fooled before and that is the reason that she is still with [place] under care and treatment at this point. There is no evidence of purulent discharge or infection. 10/31/17 on evaluation today patient continues to have drainage from her right thigh ulcer. This region is still open at this point and she does have an original death as well as a tunnel at 12 o'clock that still extends more deeply even than the overall depth of the wound. With that being said I do believe that there is no evidence of significant infection at this point time which is good news. I think the biggest issue is just fluid collection as far as the packing is concerned I'm also wondering if we are packing this as well especially in regard to the 12 o'clock location at this point. Utilizing the skinny probe's are better. Patient states she's not had as much pain since we have been performing the packing in that manner using the skinny probe. Patient History Information obtained from Patient. Family History Cancer - Child, Diabetes - Child, Heart Disease - Child, No family history of Hypertension, Kidney Disease, Lung Disease, Seizures, Stroke, Thyroid Problems, Tuberculosis. Social History Never smoker, Marital Status - Widowed, Alcohol Use - Never, Drug Use - No History, Caffeine Use - Daily. Medical And Surgical History Notes Constitutional Symptoms (General Health) High Blood Pressure, Diabetes, Aspirin for heart, fluid pil Cardiovascular Heart Bypass Review of Systems (ROS) Constitutional Symptoms (General Health) Denies  complaints or symptoms of Fever, Chills. Respiratory The patient has no complaints or symptoms. Cardiovascular The patient has no complaints or symptoms. Psychiatric The patient has no complaints or symptoms. Objective Constitutional Well-nourished and well-hydrated in no acute distress. Bendix, Jyasia M. ([id number]) Vitals Time Taken: 9:43 AM, Height: 63 in, Weight: 145 lbs, BMI: 25.7, Temperature: 98.1 F, Pulse: 62 bpm, Respiratory Rate: 18 breaths/min, Blood Pressure: 122/49 mmHg. Respiratory normal breathing without difficulty. clear to auscultation bilaterally. Cardiovascular regular rate and rhythm with normal S1, S2. Psychiatric this patient is able to make decisions and demonstrates good insight into disease process. Alert and Oriented x 3. pleasant and cooperative. General Notes: Patient's wound at this point shows a small opening which continues to drain  although I do believe the main issue is the tunnel at 12 o'clock that still has not sealed up. This is definitely an area that we need to be sure to pack and in order to do this the packing strip does have to be cut in half in order to fit even at quarter inch. Integumentary (Hair, Skin) Wound #3 status is Open. Original cause of wound was Bump. The wound is located on the Right,Anterior Upper Leg. The wound measures 0.3cm length x 0.3cm width x 1.9cm depth; 0.071cm^2 area and 0.134cm^3 volume. There is Fat Layer (Subcutaneous Tissue) Exposed exposed. There is no undermining noted, however, there is tunneling at 12:00 with a maximum distance of 10.8cm. There is a large amount of serous drainage noted. The wound margin is well defined and not attached to the wound base. There is large (67-100%) red granulation within the wound bed. There is no necrotic tissue within the wound bed. The periwound skin appearance exhibited: Hemosiderin Staining. The periwound skin appearance did not exhibit: Callus, Crepitus, Excoriation,  Induration, Rash, Scarring, Dry/Scaly, Maceration, Atrophie Blanche, Cyanosis, Ecchymosis, Mottled, Pallor, Rubor, Erythema. Periwound temperature was noted as No Abnormality. The periwound has tenderness on palpation. 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 Cleanse wound with mild soap and water May Shower, gently pat wound dry prior to applying new dressing. Anesthetic (add to Medication List): Wound #3 Right,Anterior Upper Leg: [name], [name] ([id number]) Topical Lidocaine 4% cream applied to wound bed prior to debridement (In Clinic Only). Primary Wound Dressing: Wound #3 Right,Anterior Upper Leg: Iodoform packing Gauze - 1/4 " inch may have to cut in 1/2 PLEASE PACK TUNNEL AT 12:00 THAT MEASURES 10.8CM Secondary Dressing: Wound #3 Right,Anterior Upper Leg: Drawtex [place] Dressing Change Frequency: Wound #3 Right,Anterior Upper Leg: Change dressing every other day. Follow-up Appointments: Wound #3 Right,Anterior Upper Leg: Return Appointment in 1 week. Additional Orders / Instructions: Wound #3 Right,Anterior Upper Leg: Increase protein intake. Home Health: Wound #3 Right,Anterior Upper Leg: Continue Home Health Visits Home Health Nurse may visit PRN to address patient s wound care needs. FACE TO FACE ENCOUNTER: MEDICARE and MEDICAID PATIENTS: I certify that this patient is under my care and that I had a face-to-face encounter that meets the physician face-to-face encounter requirements with this patient on this date. The encounter with the patient was in whole or in part for the following MEDICAL CONDITION: (primary reason for [place]) MEDICAL  NECESSITY: I certify, that based on my findings, NURSING services are a medically necessary home health service. HOME BOUND STATUS: I certify that my clinical findings support that this patient is homebound (i.e., Due to illness or injury, pt requires aid of supportive devices such as crutches, cane, wheelchairs, walkers, the use of special transportation or the assistance of another person to leave their place of residence. There is a normal inability to leave the home and doing so requires considerable and taxing effort. Other absences are for medical reasons / religious services and are infrequent or of short duration when for other reasons). If current dressing causes regression in wound condition, may D/C ordered dressing product/s and apply Normal Saline Moist Dressing daily until next [place] / Other MD appointment. Notify [facility] of regression in wound condition at [phone]. Please direct any NON-WOUND related issues/requests for orders to patient's Primary Care Physician I am going to suggest at this time that we go forward with continuing to pack the wound utilizing the Iodoform gauze. I do think this needs to be packed into the depth initially as well as into the 12 o'clock tunneling order to provide proper drainage of this area as well. This was performed today without complication which the quarter inch Iodoform gauze was cut in half it fit nicely into the tunnel as well. We are going to see how things progress over the next weeks time. Hopefully with appropriate packing this will continue to show signs of filling in. Please see above for specific wound care orders. We will see patient for re-evaluation in 1 week(s) here in the clinic. If anything worsens or changes patient will contact our office for additional recommendations. Electronic Signature(s) Signed: 11/01/2017 4:20:45 AM By: Worthy Keeler PA-C Entered By: Worthy Keeler on 10/31/2017  10:26:21 Hantz, Tawnie M. ([id number]) -------------------------------------------------------------------------------- ROS/PFSH Details Patient Name: SHANIKQUA, ZARZYCKI. Date of Service: 10/31/2017 9:15 AM Medical Record Number: [id number] Patient Account Number: [id number] Date of Birth/Sex: 09-Mar-1926 (84 y.o. F) Treating RN: Cornell Barman Primary Care Provider: Cranford Mon, Delfino Lovett Other Clinician: Referring Provider: Cranford Mon, Delfino Lovett Treating Provider/Extender: Melburn Hake, HOYT Weeks in Treatment: 8 Information Obtained From Patient Wound History Do you currently have one or more open woundso Yes How many open wounds do you currently haveo 1 Approximately how long have you had your woundso 5 months How have you been treating your wound(s) until nowo ointment Has your wound(s) ever healed and then re-openedo No Have you had any lab work done in the past montho No Have you tested positive for an antibiotic resistant organism (MRSA, VRE)o No Have you tested positive for osteomyelitis (bone infection)o No Have you had any tests for circulation on your legso No Constitutional Symptoms (General Health) Complaints and Symptoms: Negative for: Fever; Chills Medical History: Past Medical History Notes: High Blood Pressure, Diabetes, Aspirin for heart, fluid pil Eyes Medical History: Positive for: Cataracts - removed; Glaucoma Negative for: Optic Neuritis Ear/Nose/Mouth/Throat Medical History: Negative for: Chronic sinus problems/congestion; Middle ear problems Hematologic/Lymphatic Medical History: Positive for: Anemia Negative for: Hemophilia; Human Immunodeficiency Virus; Lymphedema; Sickle Cell Disease Respiratory Complaints and Symptoms: No Complaints or Symptoms Medical History: Negative for: Aspiration; Asthma; Chronic Obstructive Pulmonary Disease (COPD); Pneumothorax; Sleep Apnea; Tuberculosis Cardiovascular Heemstra, Bryonna M. ([id number]) Complaints and Symptoms: No  Complaints or Symptoms Medical History: Positive for: Hypertension Negative for: Angina; Arrhythmia; Congestive Heart Failure; Coronary Artery Disease; Deep Vein Thrombosis; Hypotension; Myocardial Infarction; Peripheral Arterial Disease; Peripheral Venous Disease; Phlebitis; Vasculitis Past Medical History Notes: Heart Bypass Gastrointestinal Medical History: Negative for: Cirrhosis ; Colitis; Crohnos; Hepatitis A; Hepatitis C Endocrine Medical History: Positive for: Type II Diabetes Negative for: Type I Diabetes Time with diabetes: 70 Treated with: Insulin Blood sugar tested every day: No Genitourinary Medical History: Negative for: End Stage Renal Disease Immunological Medical History: Negative for: Lupus Erythematosus; Raynaudos; Scleroderma Integumentary (Skin) Medical History: Negative for: History of Burn; History of pressure wounds Musculoskeletal Medical History: Positive for: Osteoarthritis Negative for: Gout; Rheumatoid Arthritis; Osteomyelitis Neurologic Medical History: Negative for: Dementia; Neuropathy; Quadriplegia; Paraplegia; Seizure Disorder Oncologic Medical History: Negative for: Received Chemotherapy; Received Radiation Psychiatric Complaints and Symptoms: No Complaints or Symptoms Oakley, Haley M. ([id number]) Medical History: Negative for: Anorexia/bulimia; Confinement Anxiety HBO Extended History Items Eyes: Eyes: Cataracts Glaucoma  Immunizations Pneumococcal Vaccine: Received Pneumococcal Vaccination: Yes Implantable Devices Family and Social History Cancer: Yes - Child; Diabetes: Yes - Child; Heart Disease: Yes - Child; Hypertension: No; Kidney Disease: No; Lung Disease: No; Seizures: No; Stroke: No; Thyroid Problems: No; Tuberculosis: No; Never smoker; Marital Status - Widowed; Alcohol Use: Never; Drug Use: No History; Caffeine Use: Daily; Advanced Directives: No; Patient does not want information on Advanced Directives; Living Will:  No; Medical Power of Attorney: No Physician Affirmation I have reviewed and agree with the above information. Electronic Signature(s) Signed: 11/01/2017 4:20:45 AM By: Worthy Keeler PA-C Signed: 11/03/2017 4:43:23 PM By: Gretta Cool BSN, RN, CWS, Kim RN, BSN Entered By: Worthy Keeler on 10/31/2017 10:24:55 Lazaro, Ofilia Neas. ([id number]) -------------------------------------------------------------------------------- SuperBill Details Patient Name: SKYRAH, KRUPP. Date of Service: 10/31/2017 Medical Record Number: [id number] Patient Account Number: [id number] Date of Birth/Sex: [birth year] (84 y.o. 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 Facility Procedures CPT4 Code: [id number] Description: [phone] - WOUND CARE VISIT-LEV 2 EST PT Modifier: Quantity: 1 Physician Procedures CPT4 Code: [id number] Description: 44967 - WC PHYS LEVEL 3 - EST PT ICD-10 Diagnosis Description E11.622 Type 2 diabetes mellitus with other skin ulcer L97.112 Non-pressure chronic ulcer of right thigh with fat layer e I10 Essential (primary) hypertension E11.21 Type 2  diabetes mellitus with diabetic nephropathy Modifier: xposed Quantity: 1 Electronic Signature(s) Signed: 11/01/2017 4:20:45 AM By: Worthy Keeler PA-C Entered By: Worthy Keeler on 10/31/2017 10:26:37

## 2017-11-06 DIAGNOSIS — Z7982 Long term (current) use of aspirin: Secondary | ICD-10-CM | POA: Diagnosis not present

## 2017-11-06 DIAGNOSIS — E11622 Type 2 diabetes mellitus with other skin ulcer: Secondary | ICD-10-CM | POA: Diagnosis not present

## 2017-11-06 DIAGNOSIS — Z96641 Presence of right artificial hip joint: Secondary | ICD-10-CM | POA: Diagnosis not present

## 2017-11-06 DIAGNOSIS — Z794 Long term (current) use of insulin: Secondary | ICD-10-CM | POA: Diagnosis not present

## 2017-11-06 DIAGNOSIS — I10 Essential (primary) hypertension: Secondary | ICD-10-CM | POA: Diagnosis not present

## 2017-11-06 DIAGNOSIS — Z791 Long term (current) use of non-steroidal anti-inflammatories (NSAID): Secondary | ICD-10-CM | POA: Diagnosis not present

## 2017-11-06 DIAGNOSIS — L97112 Non-pressure chronic ulcer of right thigh with fat layer exposed: Secondary | ICD-10-CM | POA: Diagnosis not present

## 2017-11-07 ENCOUNTER — Ambulatory Visit: Payer: Medicare Other | Admitting: Physician Assistant

## 2017-11-10 DIAGNOSIS — Z7982 Long term (current) use of aspirin: Secondary | ICD-10-CM | POA: Diagnosis not present

## 2017-11-10 DIAGNOSIS — Z794 Long term (current) use of insulin: Secondary | ICD-10-CM | POA: Diagnosis not present

## 2017-11-10 DIAGNOSIS — L97112 Non-pressure chronic ulcer of right thigh with fat layer exposed: Secondary | ICD-10-CM | POA: Diagnosis not present

## 2017-11-10 DIAGNOSIS — I10 Essential (primary) hypertension: Secondary | ICD-10-CM | POA: Diagnosis not present

## 2017-11-10 DIAGNOSIS — Z96641 Presence of right artificial hip joint: Secondary | ICD-10-CM | POA: Diagnosis not present

## 2017-11-10 DIAGNOSIS — E11622 Type 2 diabetes mellitus with other skin ulcer: Secondary | ICD-10-CM | POA: Diagnosis not present

## 2017-11-10 DIAGNOSIS — Z791 Long term (current) use of non-steroidal anti-inflammatories (NSAID): Secondary | ICD-10-CM | POA: Diagnosis not present

## 2017-11-14 DIAGNOSIS — I10 Essential (primary) hypertension: Secondary | ICD-10-CM | POA: Diagnosis not present

## 2017-11-14 DIAGNOSIS — Z96641 Presence of right artificial hip joint: Secondary | ICD-10-CM | POA: Diagnosis not present

## 2017-11-14 DIAGNOSIS — Z7982 Long term (current) use of aspirin: Secondary | ICD-10-CM | POA: Diagnosis not present

## 2017-11-14 DIAGNOSIS — Z791 Long term (current) use of non-steroidal anti-inflammatories (NSAID): Secondary | ICD-10-CM | POA: Diagnosis not present

## 2017-11-14 DIAGNOSIS — E11622 Type 2 diabetes mellitus with other skin ulcer: Secondary | ICD-10-CM | POA: Diagnosis not present

## 2017-11-14 DIAGNOSIS — Z794 Long term (current) use of insulin: Secondary | ICD-10-CM | POA: Diagnosis not present

## 2017-11-14 DIAGNOSIS — L97112 Non-pressure chronic ulcer of right thigh with fat layer exposed: Secondary | ICD-10-CM | POA: Diagnosis not present

## 2017-11-15 DIAGNOSIS — E11622 Type 2 diabetes mellitus with other skin ulcer: Secondary | ICD-10-CM | POA: Diagnosis not present

## 2017-11-16 DIAGNOSIS — E11622 Type 2 diabetes mellitus with other skin ulcer: Secondary | ICD-10-CM | POA: Diagnosis not present

## 2017-11-16 DIAGNOSIS — L97112 Non-pressure chronic ulcer of right thigh with fat layer exposed: Secondary | ICD-10-CM | POA: Diagnosis not present

## 2017-11-17 DIAGNOSIS — L97112 Non-pressure chronic ulcer of right thigh with fat layer exposed: Secondary | ICD-10-CM | POA: Diagnosis not present

## 2017-11-17 DIAGNOSIS — E11622 Type 2 diabetes mellitus with other skin ulcer: Secondary | ICD-10-CM | POA: Diagnosis not present

## 2017-11-17 DIAGNOSIS — I10 Essential (primary) hypertension: Secondary | ICD-10-CM | POA: Diagnosis not present

## 2017-11-17 DIAGNOSIS — Z791 Long term (current) use of non-steroidal anti-inflammatories (NSAID): Secondary | ICD-10-CM | POA: Diagnosis not present

## 2017-11-17 DIAGNOSIS — Z96641 Presence of right artificial hip joint: Secondary | ICD-10-CM | POA: Diagnosis not present

## 2017-11-17 DIAGNOSIS — Z7982 Long term (current) use of aspirin: Secondary | ICD-10-CM | POA: Diagnosis not present

## 2017-11-17 DIAGNOSIS — Z794 Long term (current) use of insulin: Secondary | ICD-10-CM | POA: Diagnosis not present

## 2017-11-21 DIAGNOSIS — Z791 Long term (current) use of non-steroidal anti-inflammatories (NSAID): Secondary | ICD-10-CM | POA: Diagnosis not present

## 2017-11-21 DIAGNOSIS — Z7982 Long term (current) use of aspirin: Secondary | ICD-10-CM | POA: Diagnosis not present

## 2017-11-21 DIAGNOSIS — Z794 Long term (current) use of insulin: Secondary | ICD-10-CM | POA: Diagnosis not present

## 2017-11-21 DIAGNOSIS — L97112 Non-pressure chronic ulcer of right thigh with fat layer exposed: Secondary | ICD-10-CM | POA: Diagnosis not present

## 2017-11-21 DIAGNOSIS — Z96641 Presence of right artificial hip joint: Secondary | ICD-10-CM | POA: Diagnosis not present

## 2017-11-21 DIAGNOSIS — I10 Essential (primary) hypertension: Secondary | ICD-10-CM | POA: Diagnosis not present

## 2017-11-21 DIAGNOSIS — E11622 Type 2 diabetes mellitus with other skin ulcer: Secondary | ICD-10-CM | POA: Diagnosis not present

## 2017-11-23 DIAGNOSIS — E11622 Type 2 diabetes mellitus with other skin ulcer: Secondary | ICD-10-CM | POA: Diagnosis not present

## 2017-11-23 DIAGNOSIS — L97112 Non-pressure chronic ulcer of right thigh with fat layer exposed: Secondary | ICD-10-CM | POA: Diagnosis not present

## 2017-11-24 DIAGNOSIS — E11622 Type 2 diabetes mellitus with other skin ulcer: Secondary | ICD-10-CM | POA: Diagnosis not present

## 2017-11-24 DIAGNOSIS — Z791 Long term (current) use of non-steroidal anti-inflammatories (NSAID): Secondary | ICD-10-CM | POA: Diagnosis not present

## 2017-11-24 DIAGNOSIS — I10 Essential (primary) hypertension: Secondary | ICD-10-CM | POA: Diagnosis not present

## 2017-11-24 DIAGNOSIS — L97112 Non-pressure chronic ulcer of right thigh with fat layer exposed: Secondary | ICD-10-CM | POA: Diagnosis not present

## 2017-11-24 DIAGNOSIS — Z96641 Presence of right artificial hip joint: Secondary | ICD-10-CM | POA: Diagnosis not present

## 2017-11-24 DIAGNOSIS — Z7982 Long term (current) use of aspirin: Secondary | ICD-10-CM | POA: Diagnosis not present

## 2017-11-24 DIAGNOSIS — Z794 Long term (current) use of insulin: Secondary | ICD-10-CM | POA: Diagnosis not present

## 2017-11-27 DIAGNOSIS — Z791 Long term (current) use of non-steroidal anti-inflammatories (NSAID): Secondary | ICD-10-CM | POA: Diagnosis not present

## 2017-11-27 DIAGNOSIS — Z96641 Presence of right artificial hip joint: Secondary | ICD-10-CM | POA: Diagnosis not present

## 2017-11-27 DIAGNOSIS — L97112 Non-pressure chronic ulcer of right thigh with fat layer exposed: Secondary | ICD-10-CM | POA: Diagnosis not present

## 2017-11-27 DIAGNOSIS — I10 Essential (primary) hypertension: Secondary | ICD-10-CM | POA: Diagnosis not present

## 2017-11-27 DIAGNOSIS — Z794 Long term (current) use of insulin: Secondary | ICD-10-CM | POA: Diagnosis not present

## 2017-11-27 DIAGNOSIS — E11622 Type 2 diabetes mellitus with other skin ulcer: Secondary | ICD-10-CM | POA: Diagnosis not present

## 2017-11-27 DIAGNOSIS — Z7982 Long term (current) use of aspirin: Secondary | ICD-10-CM | POA: Diagnosis not present

## 2017-11-29 DIAGNOSIS — E11622 Type 2 diabetes mellitus with other skin ulcer: Secondary | ICD-10-CM | POA: Diagnosis not present

## 2017-11-29 DIAGNOSIS — L97112 Non-pressure chronic ulcer of right thigh with fat layer exposed: Secondary | ICD-10-CM | POA: Diagnosis not present

## 2017-11-30 ENCOUNTER — Ambulatory Visit: Payer: Medicare Other | Admitting: Nurse Practitioner

## 2017-11-30 DIAGNOSIS — H40153 Residual stage of open-angle glaucoma, bilateral: Secondary | ICD-10-CM | POA: Diagnosis not present

## 2017-12-01 DIAGNOSIS — Z791 Long term (current) use of non-steroidal anti-inflammatories (NSAID): Secondary | ICD-10-CM | POA: Diagnosis not present

## 2017-12-01 DIAGNOSIS — I10 Essential (primary) hypertension: Secondary | ICD-10-CM | POA: Diagnosis not present

## 2017-12-01 DIAGNOSIS — Z7982 Long term (current) use of aspirin: Secondary | ICD-10-CM | POA: Diagnosis not present

## 2017-12-01 DIAGNOSIS — Z96641 Presence of right artificial hip joint: Secondary | ICD-10-CM | POA: Diagnosis not present

## 2017-12-01 DIAGNOSIS — Z794 Long term (current) use of insulin: Secondary | ICD-10-CM | POA: Diagnosis not present

## 2017-12-01 DIAGNOSIS — E11622 Type 2 diabetes mellitus with other skin ulcer: Secondary | ICD-10-CM | POA: Diagnosis not present

## 2017-12-01 DIAGNOSIS — L97112 Non-pressure chronic ulcer of right thigh with fat layer exposed: Secondary | ICD-10-CM | POA: Diagnosis not present

## 2017-12-04 DIAGNOSIS — Z96641 Presence of right artificial hip joint: Secondary | ICD-10-CM | POA: Diagnosis not present

## 2017-12-04 DIAGNOSIS — Z7982 Long term (current) use of aspirin: Secondary | ICD-10-CM | POA: Diagnosis not present

## 2017-12-04 DIAGNOSIS — Z791 Long term (current) use of non-steroidal anti-inflammatories (NSAID): Secondary | ICD-10-CM | POA: Diagnosis not present

## 2017-12-04 DIAGNOSIS — Z794 Long term (current) use of insulin: Secondary | ICD-10-CM | POA: Diagnosis not present

## 2017-12-04 DIAGNOSIS — I10 Essential (primary) hypertension: Secondary | ICD-10-CM | POA: Diagnosis not present

## 2017-12-04 DIAGNOSIS — E11622 Type 2 diabetes mellitus with other skin ulcer: Secondary | ICD-10-CM | POA: Diagnosis not present

## 2017-12-04 DIAGNOSIS — L97112 Non-pressure chronic ulcer of right thigh with fat layer exposed: Secondary | ICD-10-CM | POA: Diagnosis not present

## 2017-12-05 DIAGNOSIS — H401112 Primary open-angle glaucoma, right eye, moderate stage: Secondary | ICD-10-CM | POA: Diagnosis not present

## 2017-12-05 DIAGNOSIS — H401123 Primary open-angle glaucoma, left eye, severe stage: Secondary | ICD-10-CM | POA: Diagnosis not present

## 2017-12-06 ENCOUNTER — Other Ambulatory Visit
Admission: RE | Admit: 2017-12-06 | Discharge: 2017-12-06 | Disposition: A | Discharge status: Home / Self Care | Payer: Medicare Other | Source: Ambulatory Visit | Attending: Internal Medicine | Admitting: Internal Medicine

## 2017-12-06 ENCOUNTER — Encounter: Payer: Medicare Other | Attending: Internal Medicine | Admitting: Internal Medicine

## 2017-12-06 DIAGNOSIS — I10 Essential (primary) hypertension: Secondary | ICD-10-CM | POA: Diagnosis not present

## 2017-12-06 DIAGNOSIS — E11622 Type 2 diabetes mellitus with other skin ulcer: Secondary | ICD-10-CM | POA: Diagnosis not present

## 2017-12-06 DIAGNOSIS — L089 Local infection of the skin and subcutaneous tissue, unspecified: Secondary | ICD-10-CM | POA: Insufficient documentation

## 2017-12-06 DIAGNOSIS — M1990 Unspecified osteoarthritis, unspecified site: Secondary | ICD-10-CM | POA: Diagnosis not present

## 2017-12-06 DIAGNOSIS — Z794 Long term (current) use of insulin: Secondary | ICD-10-CM | POA: Diagnosis not present

## 2017-12-06 DIAGNOSIS — E1151 Type 2 diabetes mellitus with diabetic peripheral angiopathy without gangrene: Secondary | ICD-10-CM | POA: Insufficient documentation

## 2017-12-06 DIAGNOSIS — Z96641 Presence of right artificial hip joint: Secondary | ICD-10-CM | POA: Diagnosis not present

## 2017-12-06 DIAGNOSIS — L97112 Non-pressure chronic ulcer of right thigh with fat layer exposed: Secondary | ICD-10-CM | POA: Diagnosis not present

## 2017-12-07 DIAGNOSIS — Z791 Long term (current) use of non-steroidal anti-inflammatories (NSAID): Secondary | ICD-10-CM | POA: Diagnosis not present

## 2017-12-07 DIAGNOSIS — Z96641 Presence of right artificial hip joint: Secondary | ICD-10-CM | POA: Diagnosis not present

## 2017-12-07 DIAGNOSIS — L97112 Non-pressure chronic ulcer of right thigh with fat layer exposed: Secondary | ICD-10-CM | POA: Diagnosis not present

## 2017-12-07 DIAGNOSIS — I10 Essential (primary) hypertension: Secondary | ICD-10-CM | POA: Diagnosis not present

## 2017-12-07 DIAGNOSIS — Z794 Long term (current) use of insulin: Secondary | ICD-10-CM | POA: Diagnosis not present

## 2017-12-07 DIAGNOSIS — Z7982 Long term (current) use of aspirin: Secondary | ICD-10-CM | POA: Diagnosis not present

## 2017-12-07 DIAGNOSIS — E11622 Type 2 diabetes mellitus with other skin ulcer: Secondary | ICD-10-CM | POA: Diagnosis not present

## 2017-12-09 LAB — AEROBIC CULTURE W GRAM STAIN (SUPERFICIAL SPECIMEN)

## 2017-12-09 LAB — AEROBIC CULTURE  (SUPERFICIAL SPECIMEN): GRAM STAIN: NONE SEEN

## 2017-12-10 NOTE — Progress Notes (Signed)
[name], [name] ([id number]) Visit Report for 12/06/2017 HPI Details Patient Name: Gabriela [name], [name] Date of Service: 12/06/2017 8:45 AM Medical Record Number: [id number] Patient Account Number: [id number] Date of Birth/Sex: 1926/02/03 (84 y.o. F) Treating RN: Cornell Barman Primary Care Provider: Cranford Mon, Delfino Lovett Other Clinician: Referring Provider: Wilhemena Durie Treating Provider/Extender: Tito Dine in Treatment: 13 History of Present Illness HPI Description: 84 year old female with right thigh abscess and ulceration of the right thigh noted by her orthopedic surgeon Dr. Kurtis Bushman. The patient has had right total hip arthroplasty with ostial lysis done recently in June 2017 and x-ray done postoperatively looked good. Patient was previously treated with Keflex and Septra. reviewing the orthopedic notes Dr. Sabra Heck had done a IandD of the abscess somewhere in June and at some stage had cauterized hyperbaric granulation tissue which continues to have drainage from the wound. 07/12/17; this is a patient who was seen one time by Dr. Con Memos on 13/04/2017. She did not return to our clinic. She was dressed at that point with [facility]. I think she has been followed by orthopedics A Dr Harlow Mares and Dr. Sabra Heck of orthopedics for this area since then. He has been using peroxide to the area. The history here is a bit difficult to follow. It sounds as though she had an abscess develop roughly 6 months ago. There was no trauma. Apparently she had an IandD done but I don't see any culture results. X-rays have been done that show her previous total hip replacement to be in good position. Her total hip replacement was done 20 years ago per the patient and she had not had any problems in the interim. She has not had any pain in the area. She has no systemic symptoms including fever or chills. She is a type II diabetic on oral agents and Levemir 09/05/17 patient presents concerning her  right thigh separation which has for the most part on inspection today closed over with epithelium although there is a small problematic for her. Nonetheless when she last saw Dr. Dellia Nims on July 12, 2017 he didn't do an incision and drainage and happened to mention that she may need additional testing depending on how things proceeded. However she did not come back until just now when I'm seeing her for reevaluation today. With that being said she does not seem to have any severe pain. She does continue to have drainage from the site and it has never really fully cleared and closed out as would be appropriate for a wound of this nature. Some of this may be due to the fact that she is not having appropriate follow-up and so we are not able to truly have this granulated appropriately. With that being said it does seem that she may have something more deeply draining and causing problems at this point. The good news is she's not having as much pain as previous. Patient is seen along with her granddaughter in the office at this point in time. The issue that seems to be underlying this wound was explained to both patient as well as her granddaughter No fevers, chills, nausea, or vomiting noted at this time. Again this sounds to have started initially has managed by Dr. Sabra Heck according to patient as an abscess that unfortunately has had a very difficult time healing. After reviewing the notes here further we may need to consider an MRI depending on how she is progressing at the next follow-up. We will see how things are doing.  09/19/17 on evaluation today patient appears to be doing excellent in regard to her right thigh ulcer. She has been tolerating the dressing changes without complication she does seem to be granulating in from the base and there is no deeper tunneling at this point as there was after I initially open this area up. Overall this seems to be doing very well and is feeling much  better than it did last time around I do believe. Fortunately she has no significant discomfort in fact she has no pain at all except one we are probing around in the base of the wound. We are using Prisma currently. 10/10/17 on evaluation today patient appears to be doing a little worse in regard to her right thigh ulcer. We did receive a call last week from home health nurse due to the fact that the wound had opened up more significantly and was tunneling at the 11 o'clock to 12 o'clock location. Subsequently we switch to packing this with Iodoform gauze until patient was seen today in follow-up. She does not seem to be having any significant discomfort in regard to the ulcer. Nonetheless there has been pus drainage noted although during the evaluation today I was not able to visualize this personally. She does have no evidence of erythema surrounding the wound at this point. 10/24/17 on evaluation today patient appears to be doing a little better in my opinion concerning her right thigh ulcer. Her daughter is present during the evaluation today this is the first time that I have met her daughter. She did want to be present [name], [name] ([id number]) for review of the MRI findings. I had previously ordered an MRI of the right femur as well as the pelvis to evaluate for any evidence of abscess or potential osteomyelitis that could be leading to a sinus tract in this right thigh ulcer due to the fact that the tunneling at roughly the 11 to 1 o'clock location did not seem to have an end when I was probing during my last evaluation on October 10, 2017. Subsequently the good news is the MRIs did not show any evidence of abscess, osteomyelitis, active infection, or issues obviously with the previous hip replacement. With that being said the good news is on probing today I was able to actually get an actual depth to the tunnel whereas on last evaluation this tunnel continued as far as I can probe  and further. Hopefully this is a good sign and this one is starting to really fill in. Obviously we have been fooled before and that is the reason that she is still with [place] under care and treatment at this point. There is no evidence of purulent discharge or infection. 10/31/17 on evaluation today patient continues to have drainage from her right thigh ulcer. This region is still open at this point and she does have an original death as well as a tunnel at 12 o'clock that still extends more deeply even than the overall depth of the wound. With that being said I do believe that there is no evidence of significant infection at this point time which is good news. I think the biggest issue is just fluid collection as far as the packing is concerned I'm also wondering if we are packing this as well especially in regard to the 12 o'clock location at this point. Utilizing the skinny probe's are better. Patient states she's not had as much pain since we have been performing the packing in that manner using  the skinny probe. 12/06/17; the patient is still having home health using iodoform packing. I have not seen this patient since the fall of 2018 and her attendance at our clinic has been spotty. I note that she had an MRI of the hip on 10/29/17 that did not show any evidence of osteomyelitis or a septic joint. Nevertheless she has a nonhealing draining sinus on the right anterior thigh. This actually started apparently is an IandD site sometime in the summer of 2018. And it is not closed since. Culture I did in November was negative. She is still having serosanguineous drainage which is scant today nevertheless I have cultured this. Electronic Signature(s) Signed: 12/06/2017 5:40:08 PM By: Linton Ham MD Entered By: Linton Ham on 12/06/2017 09:36:36 [name], [name] ([id number]) -------------------------------------------------------------------------------- Physical Exam Details Patient Name:  Gabriela [name], [name] Date of Service: 12/06/2017 8:45 AM Medical Record Number: [id number] Patient Account Number: [id number] Date of Birth/Sex: May 23, 1926 (84 y.o. F) Treating RN: Cornell Barman Primary Care Provider: Cranford Mon, Delfino Lovett Other Clinician: Referring Provider: Cranford Mon, RICHARD Treating Provider/Extender: Tito Dine in Treatment: 46 Constitutional Patient is hypertensive.. Pulse regular and within target range for patient.Marland Kitchen Respirations regular, non-labored and within target range.. Temperature is normal and within the target range for the patient.Marland Kitchen appears in no distress. she does not appear to be systemically unwell. Respiratory Respiratory effort is easy and symmetric bilaterally. Rate is normal at rest and on room air.. Bilateral breath sounds are clear and equal in all lobes with no wheezes, rales or rhonchi.. Cardiovascular Heart rhythm and rate regular, without murmur or gallop.. Musculoskeletal she has very limited movement in the right hip. External rotation of the hip is very painful. She cannot lift a hip up off the bed in flexion without lifting it with her hands. Integumentary (Hair, Skin) no systemic rashes seen. Psychiatric Patient appears depressed today.. Notes wound exam; small opening at the right anterior thigh. Still some sanguinous drainage which I have cultured. She has a probing tunnel at 12:00 which measured with a skinny today is over 11-1/2 cm probing superiorly. It looks as though she has some swelling around this area but no palpable tenderness. As noted very limited motion in the right hip Electronic Signature(s) Signed: 12/06/2017 5:40:08 PM By: Linton Ham MD Entered By: Linton Ham on 12/06/2017 09:39:18 Taborda, Zandrea M. ([id number]) -------------------------------------------------------------------------------- Physician Orders Details Patient Name: Gabriela [name], [name] Date of Service: 12/06/2017 8:45 AM Medical Record  Number: [id number] Patient Account Number: [id number] Date of Birth/Sex: 27-Aug-1925 (84 y.o. F) Treating RN: Roger Shelter Primary Care Provider: Cranford Mon, Delfino Lovett Other Clinician: Referring Provider: Cranford Mon, RICHARD Treating Provider/Extender: Tito Dine in Treatment: 22 Verbal / Phone Orders: No Diagnosis Coding Wound Cleansing Wound #3 Right,Anterior Upper Leg o Clean wound with Normal Saline. Anesthetic (add to Medication List) Wound #3 Right,Anterior Upper Leg o Topical Lidocaine 4% cream applied to wound bed prior to debridement (In Clinic Only). Primary Wound Dressing Wound #3 Right,Anterior Upper Leg o Iodoform packing Gauze Secondary Dressing Wound #3 Right,Anterior Upper Leg o Boardered Foam Dressing Dressing Change Frequency Wound #3 Right,Anterior Upper Leg o Change dressing every day. Follow-up Appointments Wound #3 Right,Anterior Upper Leg o Return Appointment in 1 week. Consults o General Surgery - Ortho Electronic Signature(s) Signed: 12/06/2017 4:28:16 PM By: Roger Shelter Signed: 12/06/2017 5:40:08 PM By: Linton Ham MD Entered By: Roger Shelter on 12/06/2017 09:12:54 [name], [name] ([id number]) -------------------------------------------------------------------------------- Progress Note Details Patient Name: Gabriela [name], [name] Date of  Service: 12/06/2017 8:45 AM Medical Record Number: [id number] Patient Account Number: [id number] Date of Birth/Sex: 1925/08/22 (84 y.o. F) Treating RN: Cornell Barman Primary Care Provider: Cranford Mon, Delfino Lovett Other Clinician: Referring Provider: Cranford Mon, RICHARD Treating Provider/Extender: Tito Dine in Treatment: 13 Subjective History of Present Illness (HPI) 84 year old female with right thigh abscess and ulceration of the right thigh noted by her orthopedic surgeon Dr. Kurtis Bushman. The patient has had right total hip arthroplasty with ostial lysis done recently  in June 2017 and x-ray done postoperatively looked good. Patient was previously treated with Keflex and Septra. reviewing the orthopedic notes Dr. Sabra Heck had done a IandD of the abscess somewhere in June and at some stage had cauterized hyperbaric granulation tissue which continues to have drainage from the wound. 07/12/17; this is a patient who was seen one time by Dr. Con Memos on 13/04/2017. She did not return to our clinic. She was dressed at that point with [facility] I think she has been followed by orthopedics A Dr Harlow Mares and Dr. Sabra Heck of orthopedics for this area since then. He has been using peroxide to the area. The history here is a bit difficult to follow. It sounds as though she had an abscess develop roughly 6 months ago. There was no trauma. Apparently she had an IandD done but I don't see any culture results. X-rays have been done that show her previous total hip replacement to be in good position. Her total hip replacement was done 20 years ago per the patient and she had not had any problems in the interim. She has not had any pain in the area. She has no systemic symptoms including fever or chills. She is a type II diabetic on oral agents and Levemir 09/05/17 patient presents concerning her right thigh separation which has for the most part on inspection today closed over with epithelium although there is a small problematic for her. Nonetheless when she last saw Dr. Dellia Nims on July 12, 2017 he didn't do an incision and drainage and happened to mention that she may need additional testing depending on how things proceeded. However she did not come back until just now when I'm seeing her for reevaluation today. With that being said she does not seem to have any severe pain. She does continue to have drainage from the site and it has never really fully cleared and closed out as would be appropriate for a wound of this nature. Some of this may be due to the fact that she is not  having appropriate follow-up and so we are not able to truly have this granulated appropriately. With that being said it does seem that she may have something more deeply draining and causing problems at this point. The good news is she's not having as much pain as previous. Patient is seen along with her granddaughter in the office at this point in time. The issue that seems to be underlying this wound was explained to both patient as well as her granddaughter No fevers, chills, nausea, or vomiting noted at this time. Again this sounds to have started initially has managed by Dr. Sabra Heck according to patient as an abscess that unfortunately has had a very difficult time healing. After reviewing the notes here further we may need to consider an MRI depending on how she is progressing at the next follow-up. We will see how things are doing. 09/19/17 on evaluation today patient appears to be doing excellent in regard to her right thigh  ulcer. She has been tolerating the dressing changes without complication she does seem to be granulating in from the base and there is no deeper tunneling at this point as there was after I initially open this area up. Overall this seems to be doing very well and is feeling much better than it did last time around I do believe. Fortunately she has no significant discomfort in fact she has no pain at all except one we are probing around in the base of the wound. We are using Prisma currently. 10/10/17 on evaluation today patient appears to be doing a little worse in regard to her right thigh ulcer. We did receive a call last week from home health nurse due to the fact that the wound had opened up more significantly and was tunneling at the 11 o'clock to 12 o'clock location. Subsequently we switch to packing this with Iodoform gauze until patient was seen today in follow-up. She does not seem to be having any significant discomfort in regard to the ulcer. Nonetheless there has  been pus drainage noted although during the evaluation today I was not able to visualize this personally. She does have no evidence of erythema surrounding the wound at this point. 10/24/17 on evaluation today patient appears to be doing a little better in my opinion concerning her right thigh ulcer. Her daughter is present during the evaluation today this is the first time that I have met her daughter. She did want to be present for review of the MRI findings. I had previously ordered an MRI of the right femur as well as the pelvis to evaluate for any evidence of abscess or potential osteomyelitis that could be leading to a sinus tract in this right thigh ulcer due to the fact that Gabriela [name], [name] ([id number]) the tunneling at roughly the 11 to 1 o'clock location did not seem to have an end when I was probing during my last evaluation on October 10, 2017. Subsequently the good news is the MRIs did not show any evidence of abscess, osteomyelitis, active infection, or issues obviously with the previous hip replacement. With that being said the good news is on probing today I was able to actually get an actual depth to the tunnel whereas on last evaluation this tunnel continued as far as I can probe and further. Hopefully this is a good sign and this one is starting to really fill in. Obviously we have been fooled before and that is the reason that she is still with [place] under care and treatment at this point. There is no evidence of purulent discharge or infection. 10/31/17 on evaluation today patient continues to have drainage from her right thigh ulcer. This region is still open at this point and she does have an original death as well as a tunnel at 12 o'clock that still extends more deeply even than the overall depth of the wound. With that being said I do believe that there is no evidence of significant infection at this point time which is good news. I think the biggest issue is just fluid  collection as far as the packing is concerned I'm also wondering if we are packing this as well especially in regard to the 12 o'clock location at this point. Utilizing the skinny probe's are better. Patient states she's not had as much pain since we have been performing the packing in that manner using the skinny probe. 12/06/17; the patient is still having home health using iodoform packing. I have  not seen this patient since the fall of 2018 and her attendance at our clinic has been spotty. I note that she had an MRI of the hip on 10/29/17 that did not show any evidence of osteomyelitis or a septic joint. Nevertheless she has a nonhealing draining sinus on the right anterior thigh. This actually started apparently is an IandD site sometime in the summer of 2018. And it is not closed since. Culture I did in November was negative. She is still having serosanguineous drainage which is scant today nevertheless I have cultured this. Objective Constitutional Patient is hypertensive.. Pulse regular and within target range for patient.Marland Kitchen Respirations regular, non-labored and within target range.. Temperature is normal and within the target range for the patient.Marland Kitchen appears in no distress. she does not appear to be systemically unwell. Vitals Time Taken: 8:42 AM, Height: 63 in, Weight: 145 lbs, BMI: 25.7, Temperature: 98.2 F, Pulse: 67 bpm, Respiratory Rate: 18 breaths/min, Blood Pressure: 166/78 mmHg. Respiratory Respiratory effort is easy and symmetric bilaterally. Rate is normal at rest and on room air.. Bilateral breath sounds are clear and equal in all lobes with no wheezes, rales or rhonchi.. Cardiovascular Heart rhythm and rate regular, without murmur or gallop.. Musculoskeletal she has very limited movement in the right hip. External rotation of the hip is very painful. She cannot lift a hip up off the bed in flexion without lifting it with her hands. Psychiatric Patient appears depressed  today.. General Notes: wound exam; small opening at the right anterior thigh. Still some sanguinous drainage which I have cultured. She has a probing tunnel at 12:00 which measured with a skinny today is over 11-1/2 cm probing superiorly. It looks as though she has some swelling around this area but no palpable tenderness. As noted very limited motion in the right hip Integumentary (Hair, Skin) [name], [name] ([id number]) no systemic rashes seen. Wound #3 status is Open. Original cause of wound was Bump. The wound is located on the Right,Anterior Upper Leg. The wound measures 0.2cm length x 0.4cm width x 2.5cm depth; 0.063cm^2 area and 0.157cm^3 volume. There is Fat Layer (Subcutaneous Tissue) Exposed exposed. There is tunneling at 12:00 with a maximum distance of 11.9cm. There is a large amount of purulent drainage noted. The wound margin is well defined and not attached to the wound base. There is large (67- 100%) red granulation within the wound bed. There is no necrotic tissue within the wound bed. The periwound skin appearance exhibited: Hemosiderin Staining. The periwound skin appearance did not exhibit: Callus, Crepitus, Excoriation, Induration, Rash, Scarring, Dry/Scaly, Maceration, Atrophie Blanche, Cyanosis, Ecchymosis, Mottled, Pallor, Rubor, Erythema. Periwound temperature was noted as No Abnormality. The periwound has tenderness on palpation. Plan Wound Cleansing: Wound #3 Right,Anterior Upper Leg: Clean wound with Normal Saline. Anesthetic (add to Medication List): Wound #3 Right,Anterior Upper Leg: Topical Lidocaine 4% cream applied to wound bed prior to debridement (In Clinic Only). Primary Wound Dressing: Wound #3 Right,Anterior Upper Leg: Iodoform packing Gauze Secondary Dressing: Wound #3 Right,Anterior Upper Leg: Boardered Foam Dressing Dressing Change Frequency: Wound #3 Right,Anterior Upper Leg: Change dressing every day. Follow-up Appointments: Wound #3  Right,Anterior Upper Leg: Return Appointment in 1 week. Consults ordered were: General Surgery - Ortho #1 at this point I'm gonna send this patient back for the orthopedic surgeons to review this. I'm concerned about the tunneling of this area superiorly towards the hip. Even though the MRI that was done early last month did not show anything. #2 I've done another  culture of this area but no empiric antibiotics #3 the other concerning issue is the complete absence of a normal range of motion of the right hip with pain on motion. #4 I've continued with the iodoform packing, I don't think there is much of an option here Electronic Signature(s) Signed: 12/06/2017 5:40:08 PM By: Linton Ham MD Entered By: Linton Ham on 12/06/2017 09:40:41 [name], [name] ([id number]) -------------------------------------------------------------------------------- SuperBill Details Patient Name: Gabriela [name], [name] Date of Service: 12/06/2017 Medical Record Number: [id number] Patient Account Number: [id number] Date of Birth/Sex: 08-04-1926 (84 y.o. 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

## 2017-12-10 NOTE — Progress Notes (Addendum)
Worrell, Xariah M. ([id number]) Visit Report for 12/06/2017 Arrival Information Details Patient Name: Gabriela [name], [name] Date of Service: 12/06/2017 8:45 AM Medical Record Number: [id number] Patient Account Number: [id number] Date of Birth/Sex: [birth year] (84 y.o. F) Treating RN: Gabriela [name] Primary Care Gabriela [name]: Gabriela [name] Other Clinician: Referring Roxi Hlavaty: Gabriela [name] Treating Annell Canty/Extender: Gabriela [name] in Treatment: 13 Visit Information History Since Last Visit All ordered tests and consults were completed: No Patient Arrived: Gilford Rile Added or deleted any medications: No Arrival Time: 08:41 Any new allergies or adverse reactions: No Accompanied By: daughter Had a fall or experienced change in No Transfer Assistance: None activities of daily living that may affect Patient Identification Verified: Yes risk of falls: Secondary Verification Process Yes Signs or symptoms of abuse/neglect since last visito No Completed: Hospitalized since last visit: No Patient Has Alerts: Yes Implantable device outside of the clinic excluding No Patient Alerts: DMII cellular tissue based products placed in the center ABI [place] BILATERAL since last visit: >220 Pain Present Now: No Electronic Signature(s) Signed: 12/06/2017 4:28:16 PM By: Gabriela [name] Entered By: Gabriela [name] on 12/06/2017 08:41:55 [name], [name] ([id number]) -------------------------------------------------------------------------------- Clinic Level of Care Assessment Details Patient Name: Gabriela [name], [name] Date of Service: 12/06/2017 8:45 AM Medical Record Number: [id number] Patient Account Number: [id number] Date of Birth/Sex: 14-Jun-1926 (84 y.o. F) Treating RN: Gabriela [name] Primary Care Kasiyah Platter: Gabriela [name] Other Clinician: Referring Renate Danh: Gabriela [name] Treating Gabriela [name]/Extender: Gabriela [name] in Treatment: 13 Clinic Level of Care Assessment  Items TOOL 4 Quantity Score []  - Use when only an EandM is performed on FOLLOW-UP visit 0 ASSESSMENTS - Nursing Assessment / Reassessment X - Reassessment of Co-morbidities (includes updates in patient status) 1 10 X- 1 5 Reassessment of Adherence to Treatment Plan ASSESSMENTS - Wound and Skin Assessment / Reassessment X - Simple Wound Assessment / Reassessment - one wound 1 5 []  - 0 Complex Wound Assessment / Reassessment - multiple wounds []  - 0 Dermatologic / Skin Assessment (not related to wound area) ASSESSMENTS - Focused Assessment []  - Circumferential Edema Measurements - multi extremities 0 []  - 0 Nutritional Assessment / Counseling / Intervention []  - 0 Lower Extremity Assessment (monofilament, tuning fork, pulses) []  - 0 Peripheral Arterial Disease Assessment (using hand held doppler) ASSESSMENTS - Ostomy and/or Continence Assessment and Care []  - Incontinence Assessment and Management 0 []  - 0 Ostomy Care Assessment and Management (repouching, etc.) PROCESS - Coordination of Care []  - Simple Patient / Family Education for ongoing care 0 X- 1 20 Complex (extensive) Patient / Family Education for ongoing care X- 1 10 Staff obtains Programmer, systems, Records, Test Results / Process Orders []  - 0 Staff telephones HHA, Nursing Homes / Clarify orders / etc []  - 0 Routine Transfer to another Facility (non-emergent condition) []  - 0 Routine Hospital Admission (non-emergent condition) []  - 0 New Admissions / Biomedical engineer / Ordering NPWT, Apligraf, etc. []  - 0 Emergency Hospital Admission (emergent condition) X- 1 10 Simple Discharge Coordination [name], [name] ([id number]) []  - 0 Complex (extensive) Discharge Coordination PROCESS - Special Needs []  - Pediatric / Minor Patient Management 0 []  - 0 Isolation Patient Management []  - 0 Hearing / Language / Visual special needs []  - 0 Assessment of Community assistance (transportation, D/C planning, etc.) []  -  0 Additional assistance / Altered mentation []  - 0 Support Surface(s) Assessment (bed, cushion, seat, etc.) INTERVENTIONS - Wound Cleansing / Measurement X - Simple Wound Cleansing - one wound 1  5 []  - 0 Complex Wound Cleansing - multiple wounds X- 1 5 Wound Imaging (photographs - any number of wounds) []  - 0 Wound Tracing (instead of photographs) X- 1 5 Simple Wound Measurement - one wound []  - 0 Complex Wound Measurement - multiple wounds INTERVENTIONS - Wound Dressings []  - Small Wound Dressing one or multiple wounds 0 X- 1 15 Medium Wound Dressing one or multiple wounds []  - 0 Large Wound Dressing one or multiple wounds []  - 0 Application of Medications - topical []  - 0 Application of Medications - injection INTERVENTIONS - Miscellaneous []  - External ear exam 0 X- 1 5 Specimen Collection (cultures, biopsies, blood, body fluids, etc.) X- 1 5 Specimen(s) / Culture(s) sent or taken to Lab for analysis []  - 0 Patient Transfer (multiple staff / Civil Service fast streamer / Similar devices) []  - 0 Simple Staple / Suture removal (25 or less) []  - 0 Complex Staple / Suture removal (26 or more) []  - 0 Hypo / Hyperglycemic Management (close monitor of Blood Glucose) []  - 0 Ankle / Brachial Index (ABI) - do not check if billed separately X- 1 5 Vital Signs [name], [name] ([id number]) Has the patient been seen at the hospital within the last three years: Yes Total Score: 105 Level Of Care: New/Established - Level 3 Electronic Signature(s) Signed: 12/06/2017 4:28:16 PM By: Gabriela [name] Entered By: Gabriela [name] on 12/06/2017 09:14:14 [name], [name] ([id number]) -------------------------------------------------------------------------------- Complex / Palliative Patient Assessment Details Patient Name: Gabriela [name], [name] Date of Service: 12/06/2017 8:45 AM Medical Record Number: [id number] Patient Account Number: [id number] Date of Birth/Sex: [birth year] (84 y.o. F) Treating  RN: Gabriela [name] Primary Care Keanen Dohse: Gabriela [name] Other Clinician: Referring Gabriela [name] Treating Abdulrahman Bracey/Extender: Gabriela [name] in Treatment: 13 Palliative Management Criteria Complex Wound Management Criteria Patient requires a surgical procedure in order to achieve wound healing: hip replacement However, the patient does not wish to undergo the recommended surgical procedure. Care Approach Wound Care Plan: Complex Wound Management Electronic Signature(s) Signed: 12/25/2017 5:27:33 PM By: Gretta Cool, BSN, RN, CWS, Kim RN, BSN Signed: 12/27/2017 4:50:03 PM By: Linton Ham MD Entered By: Gretta Cool, BSN, RN, CWS, Kim on 12/25/2017 17:27:33 Knowles, Ofilia Neas. ([id number]) -------------------------------------------------------------------------------- Encounter Discharge Information Details Patient Name: Gabriela [name], [name] Date of Service: 12/06/2017 8:45 AM Medical Record Number: [id number] Patient Account Number: [id number] Date of Birth/Sex: 13-Jun-1926 (84 y.o. F) Treating RN: Gabriela [name] Primary Care Stefany Starace: Gabriela [name] Other Clinician: Referring Marquarius Lofton: Gabriela [name] Treating Hanin Decook/Extender: Gabriela [name] in Treatment: 13 Encounter Discharge Information Items Discharge Pain Level: 0 Discharge Condition: Stable Ambulatory Status: Walker Discharge Destination: Home Transportation: Private Auto Accompanied By: granddaughter Schedule Follow-up Appointment: Yes Medication Reconciliation completed and No provided to Patient/Care Mordechai Matuszak: Provided on Clinical Summary of Care: 12/06/2017 Form Type Recipient Paper Patient LB Electronic Signature(s) Signed: 12/06/2017 12:39:01 PM By: Montey Hora Entered By: Montey Hora on 12/06/2017 12:39:01 Petkus, Brunette M. ([id number]) -------------------------------------------------------------------------------- Lower Extremity Assessment Details Patient Name: Gabriela [name], [name] Date of Service: 12/06/2017 8:45 AM Medical Record Number: [id number] Patient Account Number: [id number] Date of Birth/Sex: January 22, 1926 (84 y.o. F) Treating RN: Gabriela [name] Primary Care Dream Nodal: Gabriela [name] Other Clinician: Referring Laree Garron: Gabriela [name] Treating Cariah Salatino/Extender: Gabriela [name] in Treatment: 13 Electronic Signature(s) Signed: 12/06/2017 4:28:16 PM By: Gabriela [name] Entered By: Gabriela [name] on 12/06/2017 09:04:37 Franklin, Fruma M. ([id number]) -------------------------------------------------------------------------------- Multi Wound Chart Details Patient Name: Gabriela [name], [name] Date of Service: 12/06/2017 8:45  AM Medical Record Number: [id number] Patient Account Number: [id number] Date of Birth/Sex: 1926-02-25 (84 y.o. 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] Secondary Etiology: [3:Cyst] [N/A:N/A] Comorbid History: [3:Cataracts, Glaucoma, Anemia, N/A Hypertension, Type II Diabetes, Osteoarthritis] Date Acquired: [3:12/19/2016] [N/A:N/A] Weeks of Treatment: [3:13] [N/A:N/A] Wound Status: [3:Open] [N/A:N/A] Measurements L x W x D [3:0.2x0.4x2.5] [N/A:N/A] (cm) Area (cm) : [3:0.063] [N/A:N/A] Volume (cm) : [3:0.157] [N/A:N/A] % Reduction in Area: [3:88.50%] [N/A:N/A] % Reduction in Volume: [3:-185.50%] [N/A:N/A] Position 1 (o'clock): [3:12] Maximum Distance 1 (cm): [3:11.9] Tunneling:  [3:Yes] [N/A:N/A] Classification: [3:Grade 2] [N/A:N/A] Exudate Amount: [3:Large] [N/A:N/A] Exudate Type: [3:Purulent] [N/A:N/A] Exudate Color: [3:yellow, brown, green] [N/A:N/A] Wound Margin: [3:Well defined, not attached] [N/A:N/A] Granulation Amount: [3:Large (67-100%)] [N/A:N/A] Granulation Quality: [3:Red] [N/A:N/A] Necrotic Amount: [3:None Present (0%)] [N/A:N/A] Exposed Structures: [3:Fat Layer (Subcutaneous Tissue) Exposed: Yes Fascia: No Tendon: No Muscle: No Joint: No Bone: No] [N/A:N/A] Epithelialization: [3:None] [N/A:N/A] Periwound Skin Texture: Excoriation: No N/A N/A Induration: No Callus: No Crepitus: No Rash: No Scarring: No Periwound Skin Moisture: Maceration: No N/A N/A Dry/Scaly: No Periwound Skin Color: Hemosiderin Staining: Yes N/A N/A Atrophie Blanche: No Cyanosis: No Ecchymosis: No Erythema: No Mottled: No Pallor: No Rubor: No Temperature: No Abnormality N/A N/A Tenderness on Palpation: Yes N/A N/A Wound Preparation: Ulcer Cleansing: N/A N/A Rinsed/Irrigated with Saline Topical Anesthetic Applied: Other: lidocaine 4% Treatment Notes Electronic Signature(s) Signed: 12/06/2017 4:28:16 PM By: Gabriela [name] Entered By: Gabriela [name] on 12/06/2017 09:04:52 Gaffin, Brennan M. ([id number]) -------------------------------------------------------------------------------- [place] Details Patient Name: Gabriela [name], [name] Date of Service: 12/06/2017 8:45 AM Medical Record Number: [id number] Patient Account Number: [id number] Date of Birth/Sex: 11/14/1925 (84 y.o. F) Treating RN: Gabriela [name] Primary Care Geraldina Parrott: Gabriela [name] Other Clinician: Referring Larie Mathes: Gabriela [name] Treating Ameena Vesey/Extender: Gabriela [name] in Treatment: 13 Active Inactive ` Abuse / Safety / Falls / Self Care Management Nursing Diagnoses: Potential for falls Goals: Patient will remain injury free related to falls Date  Initiated: 09/05/2017 Target Resolution Date: 11/18/2017 Goal Status: Active Interventions: Assess fall risk on admission and as needed Notes: ` Orientation to the Wound Care Program Nursing Diagnoses: Knowledge deficit related to the wound healing center program Goals: Patient/caregiver will verbalize understanding of the [place] Date Initiated: 09/05/2017 Target Resolution Date: 11/18/2017 Goal Status: Active Interventions: Provide education on orientation to the wound center Notes: ` Wound/Skin Impairment Nursing Diagnoses: Impaired tissue integrity Goals: Ulcer/skin breakdown will heal within 14 weeks Date Initiated: 09/05/2017 Target Resolution Date: 11/18/2017 Goal Status: Active Interventions: Molder, Steph M. ([id number]) Assess patient/caregiver ability to obtain necessary supplies Assess patient/caregiver ability to perform ulcer/skin care regimen upon admission and as needed Assess ulceration(s) every visit Notes: Electronic Signature(s) Signed: 12/06/2017 4:28:16 PM By: Gabriela [name] Entered By: Gabriela [name] on 12/06/2017 09:04:44 Harth, Teleshia M. ([id number]) -------------------------------------------------------------------------------- Pain Assessment Details Patient Name: Gabriela [name], [name] Date of Service: 12/06/2017 8:45 AM Medical Record Number: [id number] Patient Account Number: [id number] Date of Birth/Sex: 1925-12-04 (84 y.o. F) Treating RN: Gabriela [name] Primary Care Jaliyah Fotheringham: Gabriela [name] Other Clinician: Referring Jaelene Garciagarcia: Gabriela [name] Treating Genowefa Morga/Extender: Ricard Dillon  Weeks in Treatment: 13 Active Problems Location of Pain Severity and Description of Pain Patient Has Paino No Site Locations Pain Management and Medication Current Pain Management: Electronic Signature(s) Signed: 12/06/2017 4:28:16 PM By: Gabriela [name] Entered By: Gabriela [name] on 12/06/2017 08:42:03 Marsico, Toryn M.  ([id number]) -------------------------------------------------------------------------------- Patient/Caregiver Education Details Patient Name: Gabriela [name], [name] Date of Service: 12/06/2017 8:45 AM Medical Record Number: [id number] Patient Account Number: [id number] Date of Birth/Gender: [birth year] (84 y.o. F) Treating RN: Montey Hora Primary Care Physician: Gabriela [name] Other Clinician: Referring Physician: Wilhemena Durie Treating Physician/Extender: Gabriela [name] in Treatment: 13 Education Assessment Education Provided To: Patient Education Topics Provided Venous: Handouts: Other: wound care as ordered Methods: Explain/Verbal Responses: State content correctly Electronic Signature(s) Signed: 12/06/2017 5:00:43 PM By: Montey Hora Entered By: Montey Hora on 12/06/2017 12:39:24 Merendino, Shigeko M. ([id number]) -------------------------------------------------------------------------------- Wound Assessment Details Patient Name: Gabriela [name], [name] Date of Service: 12/06/2017 8:45 AM Medical Record Number: [id number] Patient Account Number: [id number] Date of Birth/Sex: [birth year] (84 y.o. F) Treating RN: Gabriela [name] Primary Care Tashaya Ancrum: Gabriela [name] Other Clinician: Referring Dominika Losey: Gabriela [name] Treating Amayah Staheli/Extender: Gabriela [name] in Treatment: 13 Wound Status Wound Number: 3 Primary Diabetic Wound/Ulcer of the Lower Extremity Etiology: Wound Location: Right Upper Leg - Anterior Secondary Cyst Wounding Event: Bump Etiology: Date Acquired: 12/19/2016 Wound Status: Open Weeks Of Treatment: 13 Comorbid Cataracts, Glaucoma, Anemia, Hypertension, Clustered Wound: No History: Type II Diabetes, Osteoarthritis Photos Photo Uploaded By: Gabriela [name] on 12/06/2017 16:50:20 Wound Measurements Length: (cm) 0.2 Width: (cm) 0.4 Depth: (cm) 2.5 Area: (cm) 0.063 Volume: (cm) 0.157 % Reduction in Area: 88.5% %  Reduction in Volume: -185.5% Epithelialization: None Tunneling: Yes Position (o'clock): 12 Maximum Distance: (cm) 11.9 Wound Description Classification: Grade 2 Wound Margin: Well defined, not attached Exudate Amount: Large Exudate Type: Purulent Exudate Color: yellow, brown, green Foul Odor After Cleansing: No Slough/Fibrino No Wound Bed Granulation Amount: Large (67-100%) Exposed Structure Granulation Quality: Red Fascia Exposed: No Necrotic Amount: None Present (0%) Fat Layer (Subcutaneous Tissue) Exposed: Yes Tendon Exposed: No Muscle Exposed: No Joint Exposed: No Bone Exposed: No Salone, Dnasia M. ([id number]) Periwound Skin Texture Texture Color No Abnormalities Noted: No No Abnormalities Noted: No Callus: No Atrophie Blanche: No Crepitus: No Cyanosis: No Excoriation: No Ecchymosis: No Induration: No Erythema: No Rash: No Hemosiderin Staining: Yes Scarring: No Mottled: No Pallor: No Moisture Rubor: No No Abnormalities Noted: No Dry / Scaly: No Temperature / Pain Maceration: No Temperature: No Abnormality Tenderness on Palpation: Yes Wound Preparation Ulcer Cleansing: Rinsed/Irrigated with Saline Topical Anesthetic Applied: Other: lidocaine 4%, Electronic Signature(s) Signed: 12/06/2017 4:28:16 PM By: Gabriela [name] Entered By: Gabriela [name] on 12/06/2017 09:03:57 Merkey, Marjie M. ([id number]) -------------------------------------------------------------------------------- Vitals Details Patient Name: Gabriela [name], [name] Date of Service: 12/06/2017 8:45 AM Medical Record Number: [id number] Patient Account Number: [id number] Date of Birth/Sex: [birth year] (84 y.o. F) Treating RN: Gabriela [name] Primary Care Decorian Schuenemann: Gabriela [name] Other Clinician: Referring Dasja Brase: Gabriela [name] Treating Kanyon Seibold/Extender: Gabriela [name] in Treatment: 13 Vital Signs Time Taken: 08:42 Temperature (F): 98.2 Height (in): 63 Pulse  (bpm): 67 Weight (lbs): 145 Respiratory Rate (breaths/min): 18 Body Mass Index (BMI): 25.7 Blood Pressure (mmHg): 166/78 Reference Range: 80 - 120 mg / dl Electronic Signature(s) Signed: 12/06/2017 4:28:16 PM By: Gabriela [name] Entered By: Gabriela [name] on 12/06/2017 63:84:66

## 2017-12-11 DIAGNOSIS — Z794 Long term (current) use of insulin: Secondary | ICD-10-CM | POA: Diagnosis not present

## 2017-12-11 DIAGNOSIS — Z7982 Long term (current) use of aspirin: Secondary | ICD-10-CM | POA: Diagnosis not present

## 2017-12-11 DIAGNOSIS — I10 Essential (primary) hypertension: Secondary | ICD-10-CM | POA: Diagnosis not present

## 2017-12-11 DIAGNOSIS — Z791 Long term (current) use of non-steroidal anti-inflammatories (NSAID): Secondary | ICD-10-CM | POA: Diagnosis not present

## 2017-12-11 DIAGNOSIS — E11622 Type 2 diabetes mellitus with other skin ulcer: Secondary | ICD-10-CM | POA: Diagnosis not present

## 2017-12-11 DIAGNOSIS — L97112 Non-pressure chronic ulcer of right thigh with fat layer exposed: Secondary | ICD-10-CM | POA: Diagnosis not present

## 2017-12-11 DIAGNOSIS — Z96641 Presence of right artificial hip joint: Secondary | ICD-10-CM | POA: Diagnosis not present

## 2017-12-13 ENCOUNTER — Ambulatory Visit: Payer: Self-pay

## 2017-12-13 ENCOUNTER — Ambulatory Visit: Payer: Self-pay | Admitting: Family Medicine

## 2017-12-13 ENCOUNTER — Ambulatory Visit: Payer: Medicare Other | Admitting: Internal Medicine

## 2017-12-15 ENCOUNTER — Telehealth: Payer: Self-pay

## 2017-12-15 DIAGNOSIS — Z7982 Long term (current) use of aspirin: Secondary | ICD-10-CM | POA: Diagnosis not present

## 2017-12-15 DIAGNOSIS — Z794 Long term (current) use of insulin: Secondary | ICD-10-CM | POA: Diagnosis not present

## 2017-12-15 DIAGNOSIS — Z96641 Presence of right artificial hip joint: Secondary | ICD-10-CM | POA: Diagnosis not present

## 2017-12-15 DIAGNOSIS — I10 Essential (primary) hypertension: Secondary | ICD-10-CM | POA: Diagnosis not present

## 2017-12-15 DIAGNOSIS — E11622 Type 2 diabetes mellitus with other skin ulcer: Secondary | ICD-10-CM | POA: Diagnosis not present

## 2017-12-15 DIAGNOSIS — L97112 Non-pressure chronic ulcer of right thigh with fat layer exposed: Secondary | ICD-10-CM | POA: Diagnosis not present

## 2017-12-15 DIAGNOSIS — Z791 Long term (current) use of non-steroidal anti-inflammatories (NSAID): Secondary | ICD-10-CM | POA: Diagnosis not present

## 2017-12-18 DIAGNOSIS — Z7982 Long term (current) use of aspirin: Secondary | ICD-10-CM | POA: Diagnosis not present

## 2017-12-18 DIAGNOSIS — Z794 Long term (current) use of insulin: Secondary | ICD-10-CM | POA: Diagnosis not present

## 2017-12-18 DIAGNOSIS — E11622 Type 2 diabetes mellitus with other skin ulcer: Secondary | ICD-10-CM | POA: Diagnosis not present

## 2017-12-18 DIAGNOSIS — Z791 Long term (current) use of non-steroidal anti-inflammatories (NSAID): Secondary | ICD-10-CM | POA: Diagnosis not present

## 2017-12-18 DIAGNOSIS — I10 Essential (primary) hypertension: Secondary | ICD-10-CM | POA: Diagnosis not present

## 2017-12-18 DIAGNOSIS — Z96641 Presence of right artificial hip joint: Secondary | ICD-10-CM | POA: Diagnosis not present

## 2017-12-18 DIAGNOSIS — L97112 Non-pressure chronic ulcer of right thigh with fat layer exposed: Secondary | ICD-10-CM | POA: Diagnosis not present

## 2017-12-19 DIAGNOSIS — H401112 Primary open-angle glaucoma, right eye, moderate stage: Secondary | ICD-10-CM | POA: Diagnosis not present

## 2017-12-19 DIAGNOSIS — H401123 Primary open-angle glaucoma, left eye, severe stage: Secondary | ICD-10-CM | POA: Diagnosis not present

## 2017-12-21 DIAGNOSIS — I10 Essential (primary) hypertension: Secondary | ICD-10-CM | POA: Diagnosis not present

## 2017-12-21 DIAGNOSIS — E11622 Type 2 diabetes mellitus with other skin ulcer: Secondary | ICD-10-CM | POA: Diagnosis not present

## 2017-12-21 DIAGNOSIS — L97112 Non-pressure chronic ulcer of right thigh with fat layer exposed: Secondary | ICD-10-CM | POA: Diagnosis not present

## 2017-12-21 DIAGNOSIS — Z96641 Presence of right artificial hip joint: Secondary | ICD-10-CM | POA: Diagnosis not present

## 2017-12-21 DIAGNOSIS — Z791 Long term (current) use of non-steroidal anti-inflammatories (NSAID): Secondary | ICD-10-CM | POA: Diagnosis not present

## 2017-12-21 DIAGNOSIS — Z794 Long term (current) use of insulin: Secondary | ICD-10-CM | POA: Diagnosis not present

## 2017-12-21 DIAGNOSIS — Z7982 Long term (current) use of aspirin: Secondary | ICD-10-CM | POA: Diagnosis not present

## 2017-12-25 DIAGNOSIS — Z96641 Presence of right artificial hip joint: Secondary | ICD-10-CM | POA: Diagnosis not present

## 2017-12-25 DIAGNOSIS — I10 Essential (primary) hypertension: Secondary | ICD-10-CM | POA: Diagnosis not present

## 2017-12-25 DIAGNOSIS — Z794 Long term (current) use of insulin: Secondary | ICD-10-CM | POA: Diagnosis not present

## 2017-12-25 DIAGNOSIS — L97112 Non-pressure chronic ulcer of right thigh with fat layer exposed: Secondary | ICD-10-CM | POA: Diagnosis not present

## 2017-12-25 DIAGNOSIS — E11622 Type 2 diabetes mellitus with other skin ulcer: Secondary | ICD-10-CM | POA: Diagnosis not present

## 2017-12-25 DIAGNOSIS — Z791 Long term (current) use of non-steroidal anti-inflammatories (NSAID): Secondary | ICD-10-CM | POA: Diagnosis not present

## 2017-12-25 DIAGNOSIS — Z7982 Long term (current) use of aspirin: Secondary | ICD-10-CM | POA: Diagnosis not present

## 2017-12-27 ENCOUNTER — Encounter: Payer: Medicare Other | Attending: Internal Medicine | Admitting: Internal Medicine

## 2017-12-27 DIAGNOSIS — L97112 Non-pressure chronic ulcer of right thigh with fat layer exposed: Secondary | ICD-10-CM | POA: Insufficient documentation

## 2017-12-27 DIAGNOSIS — E1121 Type 2 diabetes mellitus with diabetic nephropathy: Secondary | ICD-10-CM | POA: Insufficient documentation

## 2017-12-27 DIAGNOSIS — I10 Essential (primary) hypertension: Secondary | ICD-10-CM | POA: Diagnosis not present

## 2017-12-27 DIAGNOSIS — E11622 Type 2 diabetes mellitus with other skin ulcer: Secondary | ICD-10-CM | POA: Diagnosis not present

## 2017-12-29 DIAGNOSIS — Z96641 Presence of right artificial hip joint: Secondary | ICD-10-CM | POA: Diagnosis not present

## 2017-12-29 DIAGNOSIS — Z794 Long term (current) use of insulin: Secondary | ICD-10-CM | POA: Diagnosis not present

## 2017-12-29 DIAGNOSIS — Z7982 Long term (current) use of aspirin: Secondary | ICD-10-CM | POA: Diagnosis not present

## 2017-12-29 DIAGNOSIS — E11622 Type 2 diabetes mellitus with other skin ulcer: Secondary | ICD-10-CM | POA: Diagnosis not present

## 2017-12-29 DIAGNOSIS — Z791 Long term (current) use of non-steroidal anti-inflammatories (NSAID): Secondary | ICD-10-CM | POA: Diagnosis not present

## 2017-12-29 DIAGNOSIS — L97112 Non-pressure chronic ulcer of right thigh with fat layer exposed: Secondary | ICD-10-CM | POA: Diagnosis not present

## 2017-12-29 DIAGNOSIS — I10 Essential (primary) hypertension: Secondary | ICD-10-CM | POA: Diagnosis not present

## 2017-12-29 NOTE — Progress Notes (Addendum)
Steeber, Desmond M. ([id number]) Visit Report for 12/27/2017 Arrival Information Details Patient Name: Gabriela [name], [name] Date of Service: 12/27/2017 10:30 AM Medical Record Number: [id number] Patient Account Number: [id number] Date of Birth/Sex: 1925-09-03 (84 y.o. F) Treating RN: Roger Shelter Primary Care Dontarius Sheley: Cranford Mon, Delfino Lovett Other Clinician: Referring Kairie Vangieson: Cranford Mon, RICHARD Treating Che Below/Extender: Tito Dine in Treatment: 16 Visit Information History Since Last Visit All ordered tests and consults were completed: No Patient Arrived: Gilford Rile Added or deleted any medications: No Arrival Time: 11:15 Any new allergies or adverse reactions: No Accompanied By: dtr Had a fall or experienced change in No Transfer Assistance: None activities of daily living that may affect Patient Identification Verified: Yes risk of falls: Secondary Verification Process Yes Signs or symptoms of abuse/neglect since last visito No Completed: Hospitalized since last visit: No Patient Has Alerts: Yes Implantable device outside of the clinic excluding No Patient Alerts: DMII cellular tissue based products placed in the center ABI [place] BILATERAL since last visit: >220 Pain Present Now: No Electronic Signature(s) Signed: 12/27/2017 3:23:03 PM By: Roger Shelter Entered By: Roger Shelter on 12/27/2017 11:16:20 Zaremba, Mariaelena M. ([id number]) -------------------------------------------------------------------------------- Clinic Level of Care Assessment Details Patient Name: TERRY, ABILA. Date of Service: 12/27/2017 10:30 AM Medical Record Number: [id number] Patient Account Number: [id number] Date of Birth/Sex: 07-16-1926 (84 y.o. F) Treating RN: Cornell Barman Primary Care Tauri Ethington: Cranford Mon, Delfino Lovett Other Clinician: Referring Rilya Longo: Cranford Mon, RICHARD Treating Landrum Carbonell/Extender: Tito Dine in Treatment: 16 Clinic Level of Care Assessment  Items TOOL 4 Quantity Score []  - Use when only an EandM is performed on FOLLOW-UP visit 0 ASSESSMENTS - Nursing Assessment / Reassessment []  - Reassessment of Co-morbidities (includes updates in patient status) 0 X- 1 5 Reassessment of Adherence to Treatment Plan ASSESSMENTS - Wound and Skin Assessment / Reassessment X - Simple Wound Assessment / Reassessment - one wound 1 5 []  - 0 Complex Wound Assessment / Reassessment - multiple wounds []  - 0 Dermatologic / Skin Assessment (not related to wound area) ASSESSMENTS - Focused Assessment []  - Circumferential Edema Measurements - multi extremities 0 []  - 0 Nutritional Assessment / Counseling / Intervention []  - 0 Lower Extremity Assessment (monofilament, tuning fork, pulses) []  - 0 Peripheral Arterial Disease Assessment (using hand held doppler) ASSESSMENTS - Ostomy and/or Continence Assessment and Care []  - Incontinence Assessment and Management 0 []  - 0 Ostomy Care Assessment and Management (repouching, etc.) PROCESS - Coordination of Care X - Simple Patient / Family Education for ongoing care 1 15 []  - 0 Complex (extensive) Patient / Family Education for ongoing care []  - 0 Staff obtains Programmer, systems, Records, Test Results / Process Orders []  - 0 Staff telephones HHA, Nursing Homes / Clarify orders / etc []  - 0 Routine Transfer to another Facility (non-emergent condition) []  - 0 Routine Hospital Admission (non-emergent condition) []  - 0 New Admissions / Biomedical engineer / Ordering NPWT, Apligraf, etc. []  - 0 Emergency Hospital Admission (emergent condition) X- 1 10 Simple Discharge Coordination Wonder, Ece M. ([id number]) []  - 0 Complex (extensive) Discharge Coordination PROCESS - Special Needs []  - Pediatric / Minor Patient Management 0 []  - 0 Isolation Patient Management []  - 0 Hearing / Language / Visual special needs []  - 0 Assessment of Community assistance (transportation, D/C planning, etc.) []  -  0 Additional assistance / Altered mentation []  - 0 Support Surface(s) Assessment (bed, cushion, seat, etc.) INTERVENTIONS - Wound Cleansing / Measurement X - Simple Wound Cleansing - one wound 1  5 []  - 0 Complex Wound Cleansing - multiple wounds X- 1 5 Wound Imaging (photographs - any number of wounds) []  - 0 Wound Tracing (instead of photographs) X- 1 5 Simple Wound Measurement - one wound []  - 0 Complex Wound Measurement - multiple wounds INTERVENTIONS - Wound Dressings X - Small Wound Dressing one or multiple wounds 1 10 []  - 0 Medium Wound Dressing one or multiple wounds []  - 0 Large Wound Dressing one or multiple wounds []  - 0 Application of Medications - topical []  - 0 Application of Medications - injection INTERVENTIONS - Miscellaneous []  - External ear exam 0 []  - 0 Specimen Collection (cultures, biopsies, blood, body fluids, etc.) []  - 0 Specimen(s) / Culture(s) sent or taken to Lab for analysis []  - 0 Patient Transfer (multiple staff / Civil Service fast streamer / Similar devices) []  - 0 Simple Staple / Suture removal (25 or less) []  - 0 Complex Staple / Suture removal (26 or more) []  - 0 Hypo / Hyperglycemic Management (close monitor of Blood Glucose) []  - 0 Ankle / Brachial Index (ABI) - do not check if billed separately X- 1 5 Vital Signs Minshall, Riyan M. ([id number]) Has the patient been seen at the hospital within the last three years: Yes Total Score: 65 Level Of Care: New/Established - Level 2 Electronic Signature(s) Signed: 12/27/2017 4:52:44 PM By: Gretta Cool, BSN, RN, CWS, Kim RN, BSN Entered By: Gretta Cool, BSN, RN, CWS, Kim on 12/27/2017 11:49:18 Bucio, Ofilia Neas. ([id number]) -------------------------------------------------------------------------------- Encounter Discharge Information Details Patient Name: KALEESI, GUYTON. Date of Service: 12/27/2017 10:30 AM Medical Record Number: [id number] Patient Account Number: [id number] Date of Birth/Sex: 21-Aug-1925 (84  y.o. F) Treating RN: Montey Hora Primary Care Damarious Holtsclaw: Cranford Mon, Delfino Lovett Other Clinician: Referring Darrion Macaulay: Cranford Mon, RICHARD Treating Moody Robben/Extender: Tito Dine in Treatment: 16 Encounter Discharge Information Items Discharge Condition: Stable Ambulatory Status: Walker Discharge Destination: Home Transportation: Private Auto Accompanied By: dtr Schedule Follow-up Appointment: Yes Clinical Summary of Care: Electronic Signature(s) Signed: 12/27/2017 4:31:04 PM By: Montey Hora Entered By: Montey Hora on 12/27/2017 12:05:34 Leever, Livvy M. ([id number]) -------------------------------------------------------------------------------- Lower Extremity Assessment Details Patient Name: ATHALEE, ESTERLINE. Date of Service: 12/27/2017 10:30 AM Medical Record Number: [id number] Patient Account Number: [id number] Date of Birth/Sex: April 23, 1926 (84 y.o. F) Treating RN: Roger Shelter Primary Care Leeloo Silverthorne: Cranford Mon, Delfino Lovett Other Clinician: Referring Prateek Knipple: Cranford Mon, RICHARD Treating Jamica Woodyard/Extender: Tito Dine in Treatment: 16 Edema Assessment Assessed: [Left: No] [Right: No] Edema: [Left: N] [Right: o] Vascular Assessment Claudication: Claudication Assessment [Right:None] Pulses: Dorsalis Pedis Palpable: [Right:Yes] Posterior Tibial Extremity colors, hair growth, and conditions: Extremity Color: [Right:Normal] Hair Growth on Extremity: [Right:No] Temperature of Extremity: [Right:Warm] Capillary Refill: [Right:< 3 seconds] Electronic Signature(s) Signed: 12/27/2017 3:23:03 PM By: Roger Shelter Entered By: Roger Shelter on 12/27/2017 11:28:11 Arthurs, Sondi M. ([id number]) -------------------------------------------------------------------------------- Multi Wound Chart Details Patient Name: AMARISA, WILINSKI. Date of Service: 12/27/2017 10:30 AM Medical Record Number: [id number] Patient Account Number: [id number] Date of  Birth/Sex: 26-Dec-1925 (84 y.o. 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] Secondary Etiology: [3:Cyst] [N/A:N/A] Comorbid History: [3:Cataracts, Glaucoma, Anemia, N/A Hypertension, Type  II Diabetes, Osteoarthritis] Date Acquired: [3:12/19/2016] [N/A:N/A] Weeks of Treatment: [3:16] [N/A:N/A] Wound Status: [3:Open] [N/A:N/A] Measurements L x W x D [3:0.3x0.3x2.5] [N/A:N/A] (cm) Area (cm) : [3:0.071] [N/A:N/A] Volume (cm) : [3:0.177] [N/A:N/A] % Reduction in Area: [3:87.10%] [N/A:N/A] % Reduction in Volume: [3:-221.80%] [N/A:N/A] Position 1 (o'clock): [3:12] Maximum Distance 1 (cm): [3:2.5] Tunneling: [3:Yes] [N/A:N/A] Classification: [3:Grade 2] [N/A:N/A] Exudate Amount: [3:Large] [N/A:N/A] Exudate Type: [3:Serosanguineous] [N/A:N/A] Exudate Color: [3:red, brown] [N/A:N/A] Wound Margin: [3:Well defined, not attached] [N/A:N/A] Granulation Amount: [3:Large (67-100%)] [N/A:N/A] Granulation Quality: [3:Red] [N/A:N/A] Necrotic Amount: [3:None Present (0%)] [N/A:N/A] Exposed Structures: [3:Fat Layer (Subcutaneous Tissue) Exposed: Yes Fascia: No Tendon: No Muscle: No Joint: No Bone: No] [N/A:N/A] Epithelialization: [3:None] [N/A:N/A] Periwound Skin Texture: Excoriation: No N/A N/A Induration: No Callus: No Crepitus: No Rash: No Scarring: No Periwound Skin Moisture: Maceration: No N/A N/A Dry/Scaly: No Periwound Skin Color: Hemosiderin Staining: Yes N/A  N/A Atrophie Blanche: No Cyanosis: No Ecchymosis: No Erythema: No Mottled: No Pallor: No Rubor: No Temperature: No Abnormality N/A N/A Tenderness on Palpation: Yes N/A N/A Wound Preparation: Ulcer Cleansing: N/A N/A Rinsed/Irrigated with Saline Topical Anesthetic Applied: Other: lidocaine 4% Treatment Notes Wound #3 (Right, Anterior Upper Leg) 1. Cleansed with: Clean wound with Normal Saline 2. Anesthetic Topical Lidocaine 4% cream to wound bed prior to debridement 4. Dressing Applied: Other dressing (specify in notes) 5. Secondary Dressing Applied Bordered Foam Dressing Notes silvercel Electronic Signature(s) Signed: 12/27/2017 4:50:03 PM By: Linton Ham MD Entered By: Linton Ham on 12/27/2017 13:43:15 Creighton, Arretta M. ([id number]) -------------------------------------------------------------------------------- [place] Details Patient Name: LAUNA, GOEDKEN. Date of Service: 12/27/2017 10:30 AM Medical Record Number: [id number] Patient Account Number: [id number] Date of Birth/Sex: Oct 18, 1925 (84 y.o. F) Treating RN: Cornell Barman Primary Care Josefina Rynders: Cranford Mon, Delfino Lovett Other Clinician: Referring Oran Dillenburg: Cranford Mon, RICHARD Treating Dontrez Pettis/Extender: Tito Dine in Treatment: 16 Active Inactive Electronic Signature(s) Signed: 02/01/2018 4:23:48 PM By: Gretta Cool, BSN, RN, CWS, Kim RN, BSN Previous Signature: 12/27/2017 4:52:44 PM Version By: Gretta Cool, BSN, RN, CWS, Kim RN, BSN Entered By: Gretta Cool, BSN, RN, CWS, Kim on 02/01/2018 16:23:48 Ackroyd, Ofilia Neas. ([id number]) -------------------------------------------------------------------------------- Pain Assessment Details Patient Name: KAYDENCE, MENARD. Date of Service: 12/27/2017 10:30 AM Medical Record Number: [id number] Patient Account Number: [id number] Date of Birth/Sex: 06/30/1926 (84 y.o. F) Treating RN: Roger Shelter Primary Care Marializ Ferrebee: Cranford Mon, Delfino Lovett Other  Clinician: Referring Takerra Lupinacci: Cranford Mon, RICHARD Treating Ferdie Bakken/Extender: Tito Dine in Treatment: 16 Active Problems Location of Pain Severity and Description of Pain Patient Has Paino No Site Locations Pain Management and Medication Current Pain Management: Electronic Signature(s) Signed: 12/27/2017 3:23:03 PM By: Roger Shelter Entered By: Roger Shelter on 12/27/2017 11:16:30 Marcos, Bronda M. ([id number]) -------------------------------------------------------------------------------- Patient/Caregiver Education Details Patient Name: MARGARETHA, MAHAN. Date of Service: 12/27/2017 10:30 AM Medical Record Number: [id number] Patient Account Number: [id number] Date of Birth/Gender: August 11, 1926 (84 y.o. F) Treating RN: Montey Hora Primary Care Physician: Cranford Mon, Delfino Lovett Other Clinician: Referring Physician: Wilhemena Durie Treating Physician/Extender: Tito Dine in Treatment: 16 Education Assessment Education Provided To: Patient Education Topics Provided Wound/Skin Impairment: Handouts: Other: wound care as ordered Methods: Demonstration, Explain/Verbal Responses: State content correctly Electronic Signature(s) Signed: 12/27/2017 4:31:04 PM By: Montey Hora Entered By: Montey Hora on 12/27/2017 12:05:53 Zertuche, Edra M. ([id number]) -------------------------------------------------------------------------------- Wound Assessment Details Patient Name: RAEANNE, DESCHLER. Date of Service: 12/27/2017 10:30 AM Medical Record Number: [id number] Patient Account Number: [id number] Date of Birth/Sex: [birth year] (84 y.o. F) Treating RN: Roger Shelter Primary Care Raziya Aveni:  Wilhemena Durie Other Clinician: Referring Abisola Carrero: Cranford Mon, RICHARD Treating Darriel Sinquefield/Extender: Tito Dine in Treatment: 16 Wound Status Wound Number: 3 Primary Diabetic Wound/Ulcer of the Lower Extremity Etiology: Wound Location: Right  Upper Leg - Anterior Secondary Cyst Wounding Event: Bump Etiology: Date Acquired: 12/19/2016 Wound Status: Open Weeks Of Treatment: 16 Comorbid Cataracts, Glaucoma, Anemia, Hypertension, Clustered Wound: No History: Type II Diabetes, Osteoarthritis Photos Photo Uploaded By: Roger Shelter on 12/27/2017 15:43:41 Wound Measurements Length: (cm) 0.3 Width: (cm) 0.3 Depth: (cm) 2.5 Area: (cm) 0.071 Volume: (cm) 0.177 % Reduction in Area: 87.1% % Reduction in Volume: -221.8% Epithelialization: None Tunneling: Yes Position (o'clock): 12 Maximum Distance: (cm) 2.5 Undermining: No Wound Description Classification: Grade 2 Wound Margin: Well defined, not attached Exudate Amount: Large Exudate Type: Serosanguineous Exudate Color: red, brown Foul Odor After Cleansing: No Slough/Fibrino No Wound Bed Granulation Amount: Large (67-100%) Exposed Structure Granulation Quality: Red Fascia Exposed: No Necrotic Amount: None Present (0%) Fat Layer (Subcutaneous Tissue) Exposed: Yes Tendon Exposed: No Muscle Exposed: No Joint Exposed: No Shawler, Allaina M. ([id number]) Bone Exposed: No Periwound Skin Texture Texture Color No Abnormalities Noted: No No Abnormalities Noted: No Callus: No Atrophie Blanche: No Crepitus: No Cyanosis: No Excoriation: No Ecchymosis: No Induration: No Erythema: No Rash: No Hemosiderin Staining: Yes Scarring: No Mottled: No Pallor: No Moisture Rubor: No No Abnormalities Noted: No Dry / Scaly: No Temperature / Pain Maceration: No Temperature: No Abnormality Tenderness on Palpation: Yes Wound Preparation Ulcer Cleansing: Rinsed/Irrigated with Saline Topical Anesthetic Applied: Other: lidocaine 4%, Electronic Signature(s) Signed: 12/27/2017 3:23:03 PM By: Roger Shelter Entered By: Roger Shelter on 12/27/2017 11:26:18 Berrones, Myley M.  ([id number]) -------------------------------------------------------------------------------- Vitals Details Patient Name: PASCALE, MAVES. Date of Service: 12/27/2017 10:30 AM Medical Record Number: [id number] Patient Account Number: [id number] Date of Birth/Sex: March 03, 1926 (84 y.o. F) Treating RN: Roger Shelter Primary Care Marcine Gadway: Cranford Mon, Delfino Lovett Other Clinician: Referring Joycelin Radloff: Cranford Mon, RICHARD Treating Braxtyn Bojarski/Extender: Tito Dine in Treatment: 16 Vital Signs Time Taken: 11:16 Temperature (F): 97.9 Height (in): 63 Pulse (bpm): 57 Weight (lbs): 145 Respiratory Rate (breaths/min): 18 Body Mass Index (BMI): 25.7 Blood Pressure (mmHg): 134/50 Reference Range: 80 - 120 mg / dl Electronic Signature(s) Signed: 12/27/2017 3:23:03 PM By: Roger Shelter Entered By: Roger Shelter on 12/27/2017 11:21:43

## 2017-12-29 NOTE — Progress Notes (Signed)
Gabriela [name], [name] ([id number]) Visit Report for 12/27/2017 HPI Details Patient Name: Gabriela [name], [name] Date of Service: 12/27/2017 10:30 AM Medical Record Number: [id number] Patient Account Number: [id number] Date of Birth/Sex: 01/17/1926 (84 y.o. F) Treating RN: Gabriela [name] Primary Care Provider: Cranford Mon, Delfino [name] Other Clinician: Referring Provider: Wilhemena [name] Treating Provider/Extender: Gabriela [name] in Treatment: 16 History of Present Illness HPI Description: 84 year old female with right thigh abscess and ulceration of the right thigh noted by her orthopedic surgeon [name] The patient has had right total hip arthroplasty with ostial lysis done recently in June 2017 and x-ray done postoperatively looked good. Patient was previously treated with Keflex and Septra. reviewing the orthopedic notes Gabriela [name] had done a IandD of the abscess somewhere in June and at some stage had cauterized hyperbaric granulation tissue which continues to have drainage from the wound. 07/12/17; this is a patient who was seen one time by Gabriela [name] on 13/04/2017. She did not return to our clinic. She was dressed at that point with [name] I think she has been followed by orthopedics A Dr Gabriela [name] and Gabriela [name] of orthopedics for this area since then. He has been using peroxide to the area. The history here is a bit difficult to follow. It sounds as though she had an abscess develop roughly 6 months ago. There was no trauma. Apparently she had an IandD done but I don't see any culture results. X-rays have been done that show her previous total hip replacement to be in good position. Her total hip replacement was done 20 years ago per the patient and she had not had any problems in the interim. She has not had any pain in the area. She has no systemic symptoms including fever or chills. She is a type II diabetic on oral agents and Levemir 09/05/17 patient presents concerning  her right thigh separation which has for the most part on inspection today closed over with epithelium although there is a small problematic for her. Nonetheless when she last saw Gabriela [name] on July 12, 2017 he didn't do an incision and drainage and happened to mention that she may need additional testing depending on how things proceeded. However she did not come back until just now when [name] seeing her for reevaluation today. With that being said she does not seem to have any severe pain. She does continue to have drainage from the site and it has never really fully cleared and closed out as would be appropriate for a wound of this nature. Some of this may be due to the fact that she is not having appropriate follow-up and so we are not able to truly have this granulated appropriately. With that being said it does seem that she may have something more deeply draining and causing problems at this point. The good news is she's not having as much pain as previous. Patient is seen along with her granddaughter in the office at this point in time. The issue that seems to be underlying this wound was explained to both patient as well as her granddaughter No fevers, chills, nausea, or vomiting noted at this time. Again this sounds to have started initially has managed by Gabriela [name] according to patient as an abscess that unfortunately has had a very difficult time healing. After reviewing the notes here further we may need to consider an MRI depending on how she is progressing at the next follow-up. We will see how things are doing.  09/19/17 on evaluation today patient appears to be doing excellent in regard to her right thigh ulcer. She has been tolerating the dressing changes without complication she does seem to be granulating in from the base and there is no deeper tunneling at this point as there was after I initially open this area up. Overall this seems to be doing very well and is feeling much  better than it did last time around I do believe. Fortunately she has no significant discomfort in fact she has no pain at all except one we are probing around in the base of the wound. We are using Prisma currently. 10/10/17 on evaluation today patient appears to be doing a little worse in regard to her right thigh ulcer. We did receive a call last week from home health nurse due to the fact that the wound had opened up more significantly and was tunneling at the 11 o'clock to 12 o'clock location. Subsequently we switch to packing this with Iodoform gauze until patient was seen today in follow-up. She does not seem to be having any significant discomfort in regard to the ulcer. Nonetheless there has been pus drainage noted although during the evaluation today I was not able to visualize this personally. She does have no evidence of erythema surrounding the wound at this point. 10/24/17 on evaluation today patient appears to be doing a little better in my opinion concerning her right thigh ulcer. Her daughter is present during the evaluation today this is the first time that I have met her daughter. She did want to be present Clapper, [name] ([id number]) for review of the MRI findings. I had previously ordered an MRI of the right femur as well as the pelvis to evaluate for any evidence of abscess or potential osteomyelitis that could be leading to a sinus tract in this right thigh ulcer due to the fact that the tunneling at roughly the 11 to 1 o'clock location did not seem to have an end when I was probing during my last evaluation on October 10, 2017. Subsequently the good news is the MRIs did not show any evidence of abscess, osteomyelitis, active infection, or issues obviously with the previous hip replacement. With that being said the good news is on probing today I was able to actually get an actual depth to the tunnel whereas on last evaluation this tunnel continued as far as I can probe  and further. Hopefully this is a good sign and this one is starting to really fill in. Obviously we have been fooled before and that is the reason that she is still with [place] under care and treatment at this point. There is no evidence of purulent discharge or infection. 10/31/17 on evaluation today patient continues to have drainage from her right thigh ulcer. This region is still open at this point and she does have an original death as well as a tunnel at 12 o'clock that still extends more deeply even than the overall depth of the wound. With that being said I do believe that there is no evidence of significant infection at this point time which is good news. I think the biggest issue is just fluid collection as far as the packing is concerned [name] also wondering if we are packing this as well especially in regard to the 12 o'clock location at this point. Utilizing the skinny probe's are better. Patient states she's not had as much pain since we have been performing the packing in that manner using  the skinny probe. 12/06/17; the patient is still having home health using iodoform packing. I have not seen this patient since the fall of 2018 and her attendance at our clinic has been spotty. I note that she had an MRI of the hip on 10/29/17 that did not show any evidence of osteomyelitis or a septic joint. Nevertheless she has a nonhealing draining sinus on the right anterior thigh. This actually started apparently is an IandD site sometime in the summer of 2018. And it is not closed since. Culture I did in November was negative. She is still having serosanguineous drainage which is scant today nevertheless I have cultured this. 12/26/17; 3 week follow-up. The patient is using iodoform packing. She has a probing wound on the right anterior thigh that tunnels superiorly further then the skin he [name] using to attempt to measure it. She has some dark brown sanguinous drainage. Previous cultures have been  negative including last visit. The patient does not complain of being systemically unwell. She does not complain a lot of pain or much pain even with ambulation. Electronic Signature(s) Signed: 12/27/2017 4:50:03 PM By: Linton Ham MD Entered By: Linton Ham on 12/27/2017 13:46:00 Gabriela [name], [name] ([id number]) -------------------------------------------------------------------------------- Physical Exam Details Patient Name: Gabriela [name], [name] Date of Service: 12/27/2017 10:30 AM Medical Record Number: [id number] Patient Account Number: [id number] Date of Birth/Sex: 1925-12-24 (84 y.o. F) Treating RN: Gabriela [name] Primary Care Provider: Cranford Mon, Delfino [name] Other Clinician: Referring Provider: Cranford Mon, RICHARD Treating Provider/Extender: Gabriela [name] in Treatment: 16 Constitutional Sitting or standing Blood Pressure is within target range for patient.. Pulse regular and within target range for patient.Marland Kitchen Respirations regular, non-labored and within target range.. Temperature is normal and within the target range for the patient.Marland Kitchen appears in no distress. Notes wound exam; small opening in the right anterior thigh superior aspect. Some dark serosanguineous drainage. I did not reculture this. There is no tenderness no crepitus this probes at 12:00 further then the skinny that I was attempting to measure it with. Electronic Signature(s) Signed: 12/27/2017 4:50:03 PM By: Linton Ham MD Entered By: Linton Ham on 12/27/2017 13:47:00 [place], [name] ([id number]) -------------------------------------------------------------------------------- Physician Orders Details Patient Name: Gabriela [name], [name] Date of Service: 12/27/2017 10:30 AM Medical Record Number: [id number] Patient Account Number: [id number] Date of Birth/Sex: 1926/01/07 (84 y.o. F) Treating RN: Gabriela [name] Primary Care Provider: Cranford Mon, Delfino [name] Other Clinician: Referring Provider: Cranford Mon,  RICHARD Treating Provider/Extender: Gabriela [name] in Treatment: 49 Verbal / Phone Orders: No Diagnosis Coding Wound Cleansing Wound #3 Right,Anterior Upper Leg o Clean wound with Normal Saline. Anesthetic (add to Medication List) Wound #3 Right,Anterior Upper Leg o Topical Lidocaine 4% cream applied to wound bed prior to debridement (In Clinic Only). Primary Wound Dressing Wound #3 Right,Anterior Upper Leg o Silver Alginate - over top of wound Secondary Dressing Wound #3 Right,Anterior Upper Leg o Boardered Foam Dressing Dressing Change Frequency Wound #3 Right,Anterior Upper Leg o Change dressing every day. Follow-up Appointments Wound #3 Right,Anterior Upper Leg o Return Appointment in 2 weeks. [facility] o Orthopeadics - Is fluid coming from the hip jointo Electronic Signature(s) Signed: 12/27/2017 4:50:03 PM By: Linton Ham MD Signed: 12/27/2017 4:52:44 PM By: Gretta Cool, BSN, RN, CWS, Kim RN, BSN Entered By: Gretta Cool, BSN, RN, CWS, Kim on 12/27/2017 11:47:58 Gabriela [name], [name] ([id number]) -------------------------------------------------------------------------------- Problem List Details Patient Name: Gabriela [name], [name] Date of Service: 12/27/2017 10:30 AM Medical Record Number: [id number] Patient Account Number: [id number] Date of Birth/Sex: [birth year] (84  y.o. 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 ([id number]) -------------------------------------------------------------------------------- Progress Note Details Patient Name: Gabriela [name], [name] Date of Service: 12/27/2017 10:30 AM Medical Record Number: [id number] Patient Account Number: [id number] Date of Birth/Sex: 07-13-1926 (84 y.o. F) Treating RN: Gabriela [name] Primary Care Provider: Cranford Mon, Delfino [name] Other Clinician: Referring Provider: Cranford Mon, RICHARD Treating Provider/Extender: Gabriela [name] in Treatment: 16 Subjective History of Present Illness (HPI) 84 year old female with right thigh abscess and ulceration of the right thigh noted by her orthopedic surgeon [name] The patient has had right total hip arthroplasty with ostial lysis done recently in June 2017 and x-ray done postoperatively looked good. Patient was previously treated with Keflex and Septra. reviewing the orthopedic notes Gabriela [name] had done a IandD of the abscess somewhere in June and at some stage had cauterized hyperbaric granulation tissue which continues to have drainage from the wound. 07/12/17; this is a patient who was seen one time by Gabriela [name] on 13/04/2017. She did not return to our clinic. She was dressed at that point with [facility]. I think she has been followed by orthopedics A Dr Gabriela [name] and Gabriela [name] of orthopedics for this area since then. He has been using peroxide to the area. The history here is a bit difficult to follow. It sounds as though she had an abscess develop roughly 6 months ago. There was no trauma. Apparently she had an IandD done but I don't see any culture results. X-rays have been done that show her previous total hip replacement to be in good position. Her total hip replacement was done 20 years ago per the patient and she had  not had any problems in the interim. She has not had any pain in the area. She has no systemic symptoms including fever or chills. She is a type II diabetic on oral agents and Levemir 09/05/17 patient presents concerning her right thigh separation which has for the most part on inspection today closed over with epithelium although there is a small problematic for her. Nonetheless when she last saw Gabriela [name] on July 12, 2017 he didn't do an incision and drainage and happened to mention that she may need additional testing depending on how things proceeded. However she did not come back until just now when [name] seeing her for reevaluation today. With that being said she does not seem to have any severe pain. She does continue to have drainage from the site and it has never really fully cleared and closed out as would be appropriate for a wound of this nature. Some of this may be due to the fact that she is not having appropriate follow-up and so we are not able to truly have this granulated appropriately. With that being said it does seem that she may have something more deeply draining and causing problems at this point. The good news is she's not having as much pain as previous. Patient  is seen along with her granddaughter in the office at this point in time. The issue that seems to be underlying this wound was explained to both patient as well as her granddaughter No fevers, chills, nausea, or vomiting noted at this time. Again this sounds to have started initially has managed by Gabriela [name] according to patient as an abscess that unfortunately has had a very difficult time healing. After reviewing the notes here further we may need to consider an MRI depending on how she is progressing at the next follow-up. We will see how things are doing. 09/19/17 on evaluation today patient appears to be doing excellent in regard to her right thigh ulcer. She has been tolerating the dressing changes without  complication she does seem to be granulating in from the base and there is no deeper tunneling at this point as there was after I initially open this area up. Overall this seems to be doing very well and is feeling much better than it did last time around I do believe. Fortunately she has no significant discomfort in fact she has no pain at all except one we are probing around in the base of the wound. We are using Prisma currently. 10/10/17 on evaluation today patient appears to be doing a little worse in regard to her right thigh ulcer. We did receive a call last week from home health nurse due to the fact that the wound had opened up more significantly and was tunneling at the 11 o'clock to 12 o'clock location. Subsequently we switch to packing this with Iodoform gauze until patient was seen today in follow-up. She does not seem to be having any significant discomfort in regard to the ulcer. Nonetheless there has been pus drainage noted although during the evaluation today I was not able to visualize this personally. She does have no evidence of erythema surrounding the wound at this point. 10/24/17 on evaluation today patient appears to be doing a little better in my opinion concerning her right thigh ulcer. Her daughter is present during the evaluation today this is the first time that I have met her daughter. She did want to be present for review of the MRI findings. I had previously ordered an MRI of the right femur as well as the pelvis to evaluate for any evidence of abscess or potential osteomyelitis that could be leading to a sinus tract in this right thigh ulcer due to the fact that Gabriela [name], [name] ([id number]) the tunneling at roughly the 11 to 1 o'clock location did not seem to have an end when I was probing during my last evaluation on October 10, 2017. Subsequently the good news is the MRIs did not show any evidence of abscess, osteomyelitis, active infection, or issues obviously  with the previous hip replacement. With that being said the good news is on probing today I was able to actually get an actual depth to the tunnel whereas on last evaluation this tunnel continued as far as I can probe and further. Hopefully this is a good sign and this one is starting to really fill in. Obviously we have been fooled before and that is the reason that she is still with [place] under care and treatment at this point. There is no evidence of purulent discharge or infection. 10/31/17 on evaluation today patient continues to have drainage from her right thigh ulcer. This region is still open at this point and she does have an original death as well as a tunnel at  12 o'clock that still extends more deeply even than the overall depth of the wound. With that being said I do believe that there is no evidence of significant infection at this point time which is good news. I think the biggest issue is just fluid collection as far as the packing is concerned [name] also wondering if we are packing this as well especially in regard to the 12 o'clock location at this point. Utilizing the skinny probe's are better. Patient states she's not had as much pain since we have been performing the packing in that manner using the skinny probe. 12/06/17; the patient is still having home health using iodoform packing. I have not seen this patient since the fall of 2018 and her attendance at our clinic has been spotty. I note that she had an MRI of the hip on 10/29/17 that did not show any evidence of osteomyelitis or a septic joint. Nevertheless she has a nonhealing draining sinus on the right anterior thigh. This actually started apparently is an IandD site sometime in the summer of 2018. And it is not closed since. Culture I did in November was negative. She is still having serosanguineous drainage which is scant today nevertheless I have cultured this. 12/26/17; 3 week follow-up. The patient is using iodoform  packing. She has a probing wound on the right anterior thigh that tunnels superiorly further then the skin he [name] using to attempt to measure it. She has some dark brown sanguinous drainage. Previous cultures have been negative including last visit. The patient does not complain of being systemically unwell. She does not complain a lot of pain or much pain even with ambulation. Objective Constitutional Sitting or standing Blood Pressure is within target range for patient.. Pulse regular and within target range for patient.Marland Kitchen Respirations regular, non-labored and within target range.. Temperature is normal and within the target range for the patient.Marland Kitchen appears in no distress. Vitals Time Taken: 11:16 AM, Height: 63 in, Weight: 145 lbs, BMI: 25.7, Temperature: 97.9 F, Pulse: 57 bpm, Respiratory Rate: 18 breaths/min, Blood Pressure: 134/50 mmHg. General Notes: wound exam; small opening in the right anterior thigh superior aspect. Some dark serosanguineous drainage. I did not reculture this. There is no tenderness no crepitus this probes at 12:00 further then the skinny that I was attempting to measure it with. Integumentary (Hair, Skin) Wound #3 status is Open. Original cause of wound was Bump. The wound is located on the Right,Anterior Upper Leg. The wound measures 0.3cm length x 0.3cm width x 2.5cm depth; 0.071cm^2 area and 0.177cm^3 volume. There is Fat Layer (Subcutaneous Tissue) Exposed exposed. There is no undermining noted, however, there is tunneling at 12:00 with a maximum distance of 2.5cm. There is a large amount of serosanguineous drainage noted. The wound margin is well defined and not attached to the wound base. There is large (67-100%) red granulation within the wound bed. There is no necrotic tissue within the wound bed. The periwound skin appearance exhibited: Hemosiderin Staining. The periwound skin appearance did not exhibit: Callus, Crepitus, Excoriation, Induration, Rash,  Scarring, Dry/Scaly, Maceration, Atrophie Blanche, Cyanosis, Ecchymosis, Mottled, Pallor, Rubor, Erythema. Periwound temperature was noted as No Abnormality. The periwound has tenderness on palpation. Gabriela [name], [name] 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 Anesthetic (add to Medication List): Wound #3 Right,Anterior Upper Leg: Topical Lidocaine 4% cream applied to wound bed prior to debridement (In Clinic Only). Primary Wound Dressing: Wound #3 Right,Anterior Upper Leg: Silver Alginate - over top of wound Secondary Dressing: Wound #3 Right,Anterior Upper Leg: Boardered Foam Dressing Dressing Change Frequency: Wound #3 Right,Anterior Upper Leg: Change dressing every day. Follow-up Appointments: Wound #3 Right,Anterior Upper Leg: Return Appointment in 2 weeks. ordered were: Orthopeadics - Is fluid coming from the hip jointo #1 I don't think this needs to be packed Will simply put alginate and gauze of the top Up to a think this patient needs to see orthopedics previous MRI did not really show was the definition of this area. I wonder if she needs a fistulogram done in interventional radiology but I would like this to be ordered by orthopedics if they feel this is necessary. If they don't think this is a joint issue than [name] not sure where to look in terms of following an etiology for this Electronic Signature(s) Signed: 12/27/2017 4:50:03 PM By: Linton Ham MD Entered By: Linton Ham on 12/27/2017 13:48:20 Gabriela [name], [name] ([id number]) -------------------------------------------------------------------------------- SuperBill Details Patient Name:  CHIANNA, SPIRITO. Date of Service: 12/27/2017 Medical Record Number: [id number] Patient Account Number: [id number] Date of Birth/Sex: 13-Sep-1925 (84 y.o. 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

## 2018-01-01 DIAGNOSIS — L97112 Non-pressure chronic ulcer of right thigh with fat layer exposed: Secondary | ICD-10-CM | POA: Diagnosis not present

## 2018-01-01 DIAGNOSIS — E11622 Type 2 diabetes mellitus with other skin ulcer: Secondary | ICD-10-CM | POA: Diagnosis not present

## 2018-01-02 DIAGNOSIS — L02419 Cutaneous abscess of limb, unspecified: Secondary | ICD-10-CM | POA: Diagnosis not present

## 2018-01-02 DIAGNOSIS — Z96641 Presence of right artificial hip joint: Secondary | ICD-10-CM | POA: Diagnosis not present

## 2018-01-02 DIAGNOSIS — S7001XA Contusion of right hip, initial encounter: Secondary | ICD-10-CM | POA: Diagnosis not present

## 2018-01-03 DIAGNOSIS — Z791 Long term (current) use of non-steroidal anti-inflammatories (NSAID): Secondary | ICD-10-CM | POA: Diagnosis not present

## 2018-01-03 DIAGNOSIS — I10 Essential (primary) hypertension: Secondary | ICD-10-CM | POA: Diagnosis not present

## 2018-01-03 DIAGNOSIS — Z96641 Presence of right artificial hip joint: Secondary | ICD-10-CM | POA: Diagnosis not present

## 2018-01-03 DIAGNOSIS — L97112 Non-pressure chronic ulcer of right thigh with fat layer exposed: Secondary | ICD-10-CM | POA: Diagnosis not present

## 2018-01-03 DIAGNOSIS — Z7982 Long term (current) use of aspirin: Secondary | ICD-10-CM | POA: Diagnosis not present

## 2018-01-03 DIAGNOSIS — E11622 Type 2 diabetes mellitus with other skin ulcer: Secondary | ICD-10-CM | POA: Diagnosis not present

## 2018-01-03 DIAGNOSIS — Z794 Long term (current) use of insulin: Secondary | ICD-10-CM | POA: Diagnosis not present

## 2018-01-05 DIAGNOSIS — Z794 Long term (current) use of insulin: Secondary | ICD-10-CM | POA: Diagnosis not present

## 2018-01-05 DIAGNOSIS — Z7982 Long term (current) use of aspirin: Secondary | ICD-10-CM | POA: Diagnosis not present

## 2018-01-05 DIAGNOSIS — Z791 Long term (current) use of non-steroidal anti-inflammatories (NSAID): Secondary | ICD-10-CM | POA: Diagnosis not present

## 2018-01-05 DIAGNOSIS — Z96641 Presence of right artificial hip joint: Secondary | ICD-10-CM | POA: Diagnosis not present

## 2018-01-05 DIAGNOSIS — L97112 Non-pressure chronic ulcer of right thigh with fat layer exposed: Secondary | ICD-10-CM | POA: Diagnosis not present

## 2018-01-05 DIAGNOSIS — E11622 Type 2 diabetes mellitus with other skin ulcer: Secondary | ICD-10-CM | POA: Diagnosis not present

## 2018-01-05 DIAGNOSIS — I10 Essential (primary) hypertension: Secondary | ICD-10-CM | POA: Diagnosis not present

## 2018-01-08 DIAGNOSIS — Z791 Long term (current) use of non-steroidal anti-inflammatories (NSAID): Secondary | ICD-10-CM | POA: Diagnosis not present

## 2018-01-08 DIAGNOSIS — Z794 Long term (current) use of insulin: Secondary | ICD-10-CM | POA: Diagnosis not present

## 2018-01-08 DIAGNOSIS — Z7982 Long term (current) use of aspirin: Secondary | ICD-10-CM | POA: Diagnosis not present

## 2018-01-08 DIAGNOSIS — E11622 Type 2 diabetes mellitus with other skin ulcer: Secondary | ICD-10-CM | POA: Diagnosis not present

## 2018-01-08 DIAGNOSIS — L97112 Non-pressure chronic ulcer of right thigh with fat layer exposed: Secondary | ICD-10-CM | POA: Diagnosis not present

## 2018-01-08 DIAGNOSIS — I10 Essential (primary) hypertension: Secondary | ICD-10-CM | POA: Diagnosis not present

## 2018-01-08 DIAGNOSIS — Z96641 Presence of right artificial hip joint: Secondary | ICD-10-CM | POA: Diagnosis not present

## 2018-01-11 DIAGNOSIS — E11622 Type 2 diabetes mellitus with other skin ulcer: Secondary | ICD-10-CM | POA: Diagnosis not present

## 2018-01-11 DIAGNOSIS — I10 Essential (primary) hypertension: Secondary | ICD-10-CM | POA: Diagnosis not present

## 2018-01-11 DIAGNOSIS — Z794 Long term (current) use of insulin: Secondary | ICD-10-CM | POA: Diagnosis not present

## 2018-01-11 DIAGNOSIS — L97112 Non-pressure chronic ulcer of right thigh with fat layer exposed: Secondary | ICD-10-CM | POA: Diagnosis not present

## 2018-01-11 DIAGNOSIS — Z791 Long term (current) use of non-steroidal anti-inflammatories (NSAID): Secondary | ICD-10-CM | POA: Diagnosis not present

## 2018-01-11 DIAGNOSIS — Z7982 Long term (current) use of aspirin: Secondary | ICD-10-CM | POA: Diagnosis not present

## 2018-01-11 DIAGNOSIS — Z96641 Presence of right artificial hip joint: Secondary | ICD-10-CM | POA: Diagnosis not present

## 2018-01-15 DIAGNOSIS — L02419 Cutaneous abscess of limb, unspecified: Secondary | ICD-10-CM | POA: Diagnosis not present

## 2018-01-15 DIAGNOSIS — Z96641 Presence of right artificial hip joint: Secondary | ICD-10-CM | POA: Diagnosis not present

## 2018-01-16 DIAGNOSIS — I10 Essential (primary) hypertension: Secondary | ICD-10-CM | POA: Diagnosis not present

## 2018-01-16 DIAGNOSIS — Z794 Long term (current) use of insulin: Secondary | ICD-10-CM | POA: Diagnosis not present

## 2018-01-16 DIAGNOSIS — Z7982 Long term (current) use of aspirin: Secondary | ICD-10-CM | POA: Diagnosis not present

## 2018-01-16 DIAGNOSIS — Z96641 Presence of right artificial hip joint: Secondary | ICD-10-CM | POA: Diagnosis not present

## 2018-01-16 DIAGNOSIS — L97112 Non-pressure chronic ulcer of right thigh with fat layer exposed: Secondary | ICD-10-CM | POA: Diagnosis not present

## 2018-01-16 DIAGNOSIS — E11622 Type 2 diabetes mellitus with other skin ulcer: Secondary | ICD-10-CM | POA: Diagnosis not present

## 2018-01-16 DIAGNOSIS — Z791 Long term (current) use of non-steroidal anti-inflammatories (NSAID): Secondary | ICD-10-CM | POA: Diagnosis not present

## 2018-01-17 ENCOUNTER — Ambulatory Visit: Payer: Medicare Other | Admitting: Internal Medicine

## 2018-01-17 DIAGNOSIS — E11622 Type 2 diabetes mellitus with other skin ulcer: Secondary | ICD-10-CM | POA: Diagnosis not present

## 2018-01-18 DIAGNOSIS — Z96641 Presence of right artificial hip joint: Secondary | ICD-10-CM | POA: Diagnosis not present

## 2018-01-18 DIAGNOSIS — E11622 Type 2 diabetes mellitus with other skin ulcer: Secondary | ICD-10-CM | POA: Diagnosis not present

## 2018-01-18 DIAGNOSIS — Z794 Long term (current) use of insulin: Secondary | ICD-10-CM | POA: Diagnosis not present

## 2018-01-18 DIAGNOSIS — I10 Essential (primary) hypertension: Secondary | ICD-10-CM | POA: Diagnosis not present

## 2018-01-18 DIAGNOSIS — Z791 Long term (current) use of non-steroidal anti-inflammatories (NSAID): Secondary | ICD-10-CM | POA: Diagnosis not present

## 2018-01-18 DIAGNOSIS — L97112 Non-pressure chronic ulcer of right thigh with fat layer exposed: Secondary | ICD-10-CM | POA: Diagnosis not present

## 2018-01-18 DIAGNOSIS — Z7982 Long term (current) use of aspirin: Secondary | ICD-10-CM | POA: Diagnosis not present

## 2018-01-22 DIAGNOSIS — Z791 Long term (current) use of non-steroidal anti-inflammatories (NSAID): Secondary | ICD-10-CM | POA: Diagnosis not present

## 2018-01-22 DIAGNOSIS — Z794 Long term (current) use of insulin: Secondary | ICD-10-CM | POA: Diagnosis not present

## 2018-01-22 DIAGNOSIS — I10 Essential (primary) hypertension: Secondary | ICD-10-CM | POA: Diagnosis not present

## 2018-01-22 DIAGNOSIS — Z96641 Presence of right artificial hip joint: Secondary | ICD-10-CM | POA: Diagnosis not present

## 2018-01-22 DIAGNOSIS — E11622 Type 2 diabetes mellitus with other skin ulcer: Secondary | ICD-10-CM | POA: Diagnosis not present

## 2018-01-22 DIAGNOSIS — Z7982 Long term (current) use of aspirin: Secondary | ICD-10-CM | POA: Diagnosis not present

## 2018-01-22 DIAGNOSIS — L97112 Non-pressure chronic ulcer of right thigh with fat layer exposed: Secondary | ICD-10-CM | POA: Diagnosis not present

## 2018-01-23 DIAGNOSIS — H401123 Primary open-angle glaucoma, left eye, severe stage: Secondary | ICD-10-CM | POA: Diagnosis not present

## 2018-01-23 DIAGNOSIS — E11622 Type 2 diabetes mellitus with other skin ulcer: Secondary | ICD-10-CM | POA: Diagnosis not present

## 2018-01-23 DIAGNOSIS — H401112 Primary open-angle glaucoma, right eye, moderate stage: Secondary | ICD-10-CM | POA: Diagnosis not present

## 2018-01-25 DIAGNOSIS — L97112 Non-pressure chronic ulcer of right thigh with fat layer exposed: Secondary | ICD-10-CM | POA: Diagnosis not present

## 2018-01-25 DIAGNOSIS — Z794 Long term (current) use of insulin: Secondary | ICD-10-CM | POA: Diagnosis not present

## 2018-01-25 DIAGNOSIS — E11622 Type 2 diabetes mellitus with other skin ulcer: Secondary | ICD-10-CM | POA: Diagnosis not present

## 2018-01-25 DIAGNOSIS — Z96641 Presence of right artificial hip joint: Secondary | ICD-10-CM | POA: Diagnosis not present

## 2018-01-25 DIAGNOSIS — Z7982 Long term (current) use of aspirin: Secondary | ICD-10-CM | POA: Diagnosis not present

## 2018-01-25 DIAGNOSIS — Z791 Long term (current) use of non-steroidal anti-inflammatories (NSAID): Secondary | ICD-10-CM | POA: Diagnosis not present

## 2018-01-25 DIAGNOSIS — I10 Essential (primary) hypertension: Secondary | ICD-10-CM | POA: Diagnosis not present

## 2018-01-29 DIAGNOSIS — Z96641 Presence of right artificial hip joint: Secondary | ICD-10-CM | POA: Diagnosis not present

## 2018-01-29 DIAGNOSIS — Z794 Long term (current) use of insulin: Secondary | ICD-10-CM | POA: Diagnosis not present

## 2018-01-29 DIAGNOSIS — I10 Essential (primary) hypertension: Secondary | ICD-10-CM | POA: Diagnosis not present

## 2018-01-29 DIAGNOSIS — Z791 Long term (current) use of non-steroidal anti-inflammatories (NSAID): Secondary | ICD-10-CM | POA: Diagnosis not present

## 2018-01-29 DIAGNOSIS — Z7982 Long term (current) use of aspirin: Secondary | ICD-10-CM | POA: Diagnosis not present

## 2018-01-29 DIAGNOSIS — L97112 Non-pressure chronic ulcer of right thigh with fat layer exposed: Secondary | ICD-10-CM | POA: Diagnosis not present

## 2018-01-29 DIAGNOSIS — E11622 Type 2 diabetes mellitus with other skin ulcer: Secondary | ICD-10-CM | POA: Diagnosis not present

## 2018-01-29 NOTE — Telephone Encounter (Signed)
Last attempt - MM

## 2018-01-30 DIAGNOSIS — E11622 Type 2 diabetes mellitus with other skin ulcer: Secondary | ICD-10-CM | POA: Diagnosis not present

## 2018-01-31 DIAGNOSIS — Z794 Long term (current) use of insulin: Secondary | ICD-10-CM | POA: Diagnosis not present

## 2018-01-31 DIAGNOSIS — Z96641 Presence of right artificial hip joint: Secondary | ICD-10-CM | POA: Diagnosis not present

## 2018-01-31 DIAGNOSIS — E11622 Type 2 diabetes mellitus with other skin ulcer: Secondary | ICD-10-CM | POA: Diagnosis not present

## 2018-01-31 DIAGNOSIS — L97112 Non-pressure chronic ulcer of right thigh with fat layer exposed: Secondary | ICD-10-CM | POA: Diagnosis not present

## 2018-01-31 DIAGNOSIS — Z791 Long term (current) use of non-steroidal anti-inflammatories (NSAID): Secondary | ICD-10-CM | POA: Diagnosis not present

## 2018-01-31 DIAGNOSIS — Z7982 Long term (current) use of aspirin: Secondary | ICD-10-CM | POA: Diagnosis not present

## 2018-01-31 DIAGNOSIS — I10 Essential (primary) hypertension: Secondary | ICD-10-CM | POA: Diagnosis not present

## 2018-02-01 DIAGNOSIS — L97112 Non-pressure chronic ulcer of right thigh with fat layer exposed: Secondary | ICD-10-CM | POA: Diagnosis not present

## 2018-02-01 DIAGNOSIS — E11622 Type 2 diabetes mellitus with other skin ulcer: Secondary | ICD-10-CM | POA: Diagnosis not present

## 2018-02-19 ENCOUNTER — Other Ambulatory Visit: Payer: Self-pay | Admitting: Family Medicine

## 2018-02-19 MED ORDER — INSULIN DETEMIR 100 UNIT/ML FLEXPEN: PEN_INJECTOR | SUBCUTANEOUS | 0 refills | Status: DC

## 2018-02-19 NOTE — Telephone Encounter (Signed)
Please review. Thanks!  

## 2018-02-19 NOTE — Telephone Encounter (Signed)
[facility] [address]  [place]  Fax [phone] Phone [phone]  Pt's call back is [phone]  Thanks Con Memos

## 2018-02-19 NOTE — Telephone Encounter (Signed)
Pt is out of town in [place] and needs a refill on her insulin Levemir Flextouch  She wants it sent to

## 2018-02-20 NOTE — Telephone Encounter (Signed)
OK  To send--needs appt for further refills.

## 2018-02-28 DIAGNOSIS — Z96641 Presence of right artificial hip joint: Secondary | ICD-10-CM | POA: Diagnosis not present

## 2018-02-28 DIAGNOSIS — L02419 Cutaneous abscess of limb, unspecified: Secondary | ICD-10-CM | POA: Diagnosis not present

## 2018-03-03 DIAGNOSIS — E119 Type 2 diabetes mellitus without complications: Secondary | ICD-10-CM | POA: Diagnosis not present

## 2018-03-03 DIAGNOSIS — Z791 Long term (current) use of non-steroidal anti-inflammatories (NSAID): Secondary | ICD-10-CM | POA: Diagnosis not present

## 2018-03-03 DIAGNOSIS — M8978 Major osseous defect, other site: Secondary | ICD-10-CM | POA: Diagnosis not present

## 2018-03-03 DIAGNOSIS — H409 Unspecified glaucoma: Secondary | ICD-10-CM | POA: Diagnosis not present

## 2018-03-03 DIAGNOSIS — I10 Essential (primary) hypertension: Secondary | ICD-10-CM | POA: Diagnosis not present

## 2018-03-03 DIAGNOSIS — Z9181 History of falling: Secondary | ICD-10-CM | POA: Diagnosis not present

## 2018-03-03 DIAGNOSIS — L02415 Cutaneous abscess of right lower limb: Secondary | ICD-10-CM | POA: Diagnosis not present

## 2018-03-03 DIAGNOSIS — L03115 Cellulitis of right lower limb: Secondary | ICD-10-CM | POA: Diagnosis not present

## 2018-03-03 DIAGNOSIS — T84050D Periprosthetic osteolysis of internal prosthetic right hip joint, subsequent encounter: Secondary | ICD-10-CM | POA: Diagnosis not present

## 2018-03-03 DIAGNOSIS — Z794 Long term (current) use of insulin: Secondary | ICD-10-CM | POA: Diagnosis not present

## 2018-03-04 ENCOUNTER — Other Ambulatory Visit: Payer: Self-pay | Admitting: Family Medicine

## 2018-03-05 NOTE — Telephone Encounter (Signed)
Will forward to PCP 

## 2018-03-07 DIAGNOSIS — T84050D Periprosthetic osteolysis of internal prosthetic right hip joint, subsequent encounter: Secondary | ICD-10-CM | POA: Diagnosis not present

## 2018-03-07 DIAGNOSIS — I10 Essential (primary) hypertension: Secondary | ICD-10-CM | POA: Diagnosis not present

## 2018-03-07 DIAGNOSIS — Z791 Long term (current) use of non-steroidal anti-inflammatories (NSAID): Secondary | ICD-10-CM | POA: Diagnosis not present

## 2018-03-07 DIAGNOSIS — M8978 Major osseous defect, other site: Secondary | ICD-10-CM | POA: Diagnosis not present

## 2018-03-07 DIAGNOSIS — H409 Unspecified glaucoma: Secondary | ICD-10-CM | POA: Diagnosis not present

## 2018-03-07 DIAGNOSIS — L02415 Cutaneous abscess of right lower limb: Secondary | ICD-10-CM | POA: Diagnosis not present

## 2018-03-07 DIAGNOSIS — Z794 Long term (current) use of insulin: Secondary | ICD-10-CM | POA: Diagnosis not present

## 2018-03-07 DIAGNOSIS — Z9181 History of falling: Secondary | ICD-10-CM | POA: Diagnosis not present

## 2018-03-07 DIAGNOSIS — E119 Type 2 diabetes mellitus without complications: Secondary | ICD-10-CM | POA: Diagnosis not present

## 2018-03-07 DIAGNOSIS — L03115 Cellulitis of right lower limb: Secondary | ICD-10-CM | POA: Diagnosis not present

## 2018-03-09 DIAGNOSIS — L03115 Cellulitis of right lower limb: Secondary | ICD-10-CM | POA: Diagnosis not present

## 2018-03-09 DIAGNOSIS — Z791 Long term (current) use of non-steroidal anti-inflammatories (NSAID): Secondary | ICD-10-CM | POA: Diagnosis not present

## 2018-03-09 DIAGNOSIS — Z9181 History of falling: Secondary | ICD-10-CM | POA: Diagnosis not present

## 2018-03-09 DIAGNOSIS — M8978 Major osseous defect, other site: Secondary | ICD-10-CM | POA: Diagnosis not present

## 2018-03-09 DIAGNOSIS — I10 Essential (primary) hypertension: Secondary | ICD-10-CM | POA: Diagnosis not present

## 2018-03-09 DIAGNOSIS — T84050D Periprosthetic osteolysis of internal prosthetic right hip joint, subsequent encounter: Secondary | ICD-10-CM | POA: Diagnosis not present

## 2018-03-09 DIAGNOSIS — L02415 Cutaneous abscess of right lower limb: Secondary | ICD-10-CM | POA: Diagnosis not present

## 2018-03-09 DIAGNOSIS — H409 Unspecified glaucoma: Secondary | ICD-10-CM | POA: Diagnosis not present

## 2018-03-09 DIAGNOSIS — E119 Type 2 diabetes mellitus without complications: Secondary | ICD-10-CM | POA: Diagnosis not present

## 2018-03-09 DIAGNOSIS — Z794 Long term (current) use of insulin: Secondary | ICD-10-CM | POA: Diagnosis not present

## 2018-03-11 DIAGNOSIS — E119 Type 2 diabetes mellitus without complications: Secondary | ICD-10-CM | POA: Diagnosis not present

## 2018-03-11 DIAGNOSIS — M8978 Major osseous defect, other site: Secondary | ICD-10-CM | POA: Diagnosis not present

## 2018-03-11 DIAGNOSIS — L03115 Cellulitis of right lower limb: Secondary | ICD-10-CM | POA: Diagnosis not present

## 2018-03-11 DIAGNOSIS — I10 Essential (primary) hypertension: Secondary | ICD-10-CM | POA: Diagnosis not present

## 2018-03-11 DIAGNOSIS — Z794 Long term (current) use of insulin: Secondary | ICD-10-CM | POA: Diagnosis not present

## 2018-03-11 DIAGNOSIS — L02415 Cutaneous abscess of right lower limb: Secondary | ICD-10-CM | POA: Diagnosis not present

## 2018-03-11 DIAGNOSIS — H409 Unspecified glaucoma: Secondary | ICD-10-CM | POA: Diagnosis not present

## 2018-03-11 DIAGNOSIS — T84050D Periprosthetic osteolysis of internal prosthetic right hip joint, subsequent encounter: Secondary | ICD-10-CM | POA: Diagnosis not present

## 2018-03-11 DIAGNOSIS — Z791 Long term (current) use of non-steroidal anti-inflammatories (NSAID): Secondary | ICD-10-CM | POA: Diagnosis not present

## 2018-03-11 DIAGNOSIS — Z9181 History of falling: Secondary | ICD-10-CM | POA: Diagnosis not present

## 2018-03-13 ENCOUNTER — Other Ambulatory Visit: Payer: Self-pay | Admitting: Family Medicine

## 2018-03-13 DIAGNOSIS — Z794 Long term (current) use of insulin: Secondary | ICD-10-CM | POA: Diagnosis not present

## 2018-03-13 DIAGNOSIS — L03115 Cellulitis of right lower limb: Secondary | ICD-10-CM | POA: Diagnosis not present

## 2018-03-13 DIAGNOSIS — M8978 Major osseous defect, other site: Secondary | ICD-10-CM | POA: Diagnosis not present

## 2018-03-13 DIAGNOSIS — E119 Type 2 diabetes mellitus without complications: Secondary | ICD-10-CM | POA: Diagnosis not present

## 2018-03-13 DIAGNOSIS — T84050D Periprosthetic osteolysis of internal prosthetic right hip joint, subsequent encounter: Secondary | ICD-10-CM | POA: Diagnosis not present

## 2018-03-13 DIAGNOSIS — L02415 Cutaneous abscess of right lower limb: Secondary | ICD-10-CM | POA: Diagnosis not present

## 2018-03-13 DIAGNOSIS — I10 Essential (primary) hypertension: Secondary | ICD-10-CM | POA: Diagnosis not present

## 2018-03-13 DIAGNOSIS — Z791 Long term (current) use of non-steroidal anti-inflammatories (NSAID): Secondary | ICD-10-CM | POA: Diagnosis not present

## 2018-03-13 DIAGNOSIS — H409 Unspecified glaucoma: Secondary | ICD-10-CM | POA: Diagnosis not present

## 2018-03-13 DIAGNOSIS — Z9181 History of falling: Secondary | ICD-10-CM | POA: Diagnosis not present

## 2018-03-15 DIAGNOSIS — Z794 Long term (current) use of insulin: Secondary | ICD-10-CM | POA: Diagnosis not present

## 2018-03-15 DIAGNOSIS — H409 Unspecified glaucoma: Secondary | ICD-10-CM | POA: Diagnosis not present

## 2018-03-15 DIAGNOSIS — I10 Essential (primary) hypertension: Secondary | ICD-10-CM | POA: Diagnosis not present

## 2018-03-15 DIAGNOSIS — Z791 Long term (current) use of non-steroidal anti-inflammatories (NSAID): Secondary | ICD-10-CM | POA: Diagnosis not present

## 2018-03-15 DIAGNOSIS — Z9181 History of falling: Secondary | ICD-10-CM | POA: Diagnosis not present

## 2018-03-15 DIAGNOSIS — L02415 Cutaneous abscess of right lower limb: Secondary | ICD-10-CM | POA: Diagnosis not present

## 2018-03-15 DIAGNOSIS — E119 Type 2 diabetes mellitus without complications: Secondary | ICD-10-CM | POA: Diagnosis not present

## 2018-03-15 DIAGNOSIS — L03115 Cellulitis of right lower limb: Secondary | ICD-10-CM | POA: Diagnosis not present

## 2018-03-15 DIAGNOSIS — T84050D Periprosthetic osteolysis of internal prosthetic right hip joint, subsequent encounter: Secondary | ICD-10-CM | POA: Diagnosis not present

## 2018-03-15 DIAGNOSIS — M8978 Major osseous defect, other site: Secondary | ICD-10-CM | POA: Diagnosis not present

## 2018-03-16 DIAGNOSIS — Z791 Long term (current) use of non-steroidal anti-inflammatories (NSAID): Secondary | ICD-10-CM | POA: Diagnosis not present

## 2018-03-16 DIAGNOSIS — E119 Type 2 diabetes mellitus without complications: Secondary | ICD-10-CM | POA: Diagnosis not present

## 2018-03-16 DIAGNOSIS — T84050D Periprosthetic osteolysis of internal prosthetic right hip joint, subsequent encounter: Secondary | ICD-10-CM | POA: Diagnosis not present

## 2018-03-16 DIAGNOSIS — Z794 Long term (current) use of insulin: Secondary | ICD-10-CM | POA: Diagnosis not present

## 2018-03-16 DIAGNOSIS — Z9181 History of falling: Secondary | ICD-10-CM | POA: Diagnosis not present

## 2018-03-16 DIAGNOSIS — L03115 Cellulitis of right lower limb: Secondary | ICD-10-CM | POA: Diagnosis not present

## 2018-03-16 DIAGNOSIS — I10 Essential (primary) hypertension: Secondary | ICD-10-CM | POA: Diagnosis not present

## 2018-03-16 DIAGNOSIS — H409 Unspecified glaucoma: Secondary | ICD-10-CM | POA: Diagnosis not present

## 2018-03-16 DIAGNOSIS — L02415 Cutaneous abscess of right lower limb: Secondary | ICD-10-CM | POA: Diagnosis not present

## 2018-03-16 DIAGNOSIS — M8978 Major osseous defect, other site: Secondary | ICD-10-CM | POA: Diagnosis not present

## 2018-03-17 ENCOUNTER — Other Ambulatory Visit: Payer: Self-pay | Admitting: Family Medicine

## 2018-03-19 ENCOUNTER — Ambulatory Visit: Payer: Medicare Other | Admitting: Internal Medicine

## 2018-03-20 ENCOUNTER — Telehealth: Payer: Self-pay

## 2018-03-20 DIAGNOSIS — L02415 Cutaneous abscess of right lower limb: Secondary | ICD-10-CM | POA: Diagnosis not present

## 2018-03-20 DIAGNOSIS — Z9181 History of falling: Secondary | ICD-10-CM | POA: Diagnosis not present

## 2018-03-20 DIAGNOSIS — Z791 Long term (current) use of non-steroidal anti-inflammatories (NSAID): Secondary | ICD-10-CM | POA: Diagnosis not present

## 2018-03-20 DIAGNOSIS — M8978 Major osseous defect, other site: Secondary | ICD-10-CM | POA: Diagnosis not present

## 2018-03-20 DIAGNOSIS — Z794 Long term (current) use of insulin: Secondary | ICD-10-CM | POA: Diagnosis not present

## 2018-03-20 DIAGNOSIS — H409 Unspecified glaucoma: Secondary | ICD-10-CM | POA: Diagnosis not present

## 2018-03-20 DIAGNOSIS — T84050D Periprosthetic osteolysis of internal prosthetic right hip joint, subsequent encounter: Secondary | ICD-10-CM | POA: Diagnosis not present

## 2018-03-20 DIAGNOSIS — I10 Essential (primary) hypertension: Secondary | ICD-10-CM | POA: Diagnosis not present

## 2018-03-20 DIAGNOSIS — E119 Type 2 diabetes mellitus without complications: Secondary | ICD-10-CM | POA: Diagnosis not present

## 2018-03-20 DIAGNOSIS — L03115 Cellulitis of right lower limb: Secondary | ICD-10-CM | POA: Diagnosis not present

## 2018-03-20 NOTE — Telephone Encounter (Signed)
Home health nurse called today to schedule a cancelled appt pt had with Dr. Baxter Flattery on 03/19/18. Home health nurse stated pt's wound has increased in size and looks more rigid. She also stated she is unsure what do for the pt and feels like she needs to be seen ASAP. Advised home health nurse that if pt needs to be seen urgently to have pt go to ED to have wound looked at. PT is not complaining of any pain, states there is no drainage, does not feel like the area around the wound is swollen or warm. Pt would like for advice to be called to her daughter at [phone]. Will route message to Dr. Baxter Flattery for advice on what pt should do. Pt is currently scheduled to see Terri Piedra, Np 03/27/18 @ 10:30 [place]

## 2018-03-22 DIAGNOSIS — L03115 Cellulitis of right lower limb: Secondary | ICD-10-CM | POA: Diagnosis not present

## 2018-03-22 DIAGNOSIS — E119 Type 2 diabetes mellitus without complications: Secondary | ICD-10-CM | POA: Diagnosis not present

## 2018-03-22 DIAGNOSIS — T84050D Periprosthetic osteolysis of internal prosthetic right hip joint, subsequent encounter: Secondary | ICD-10-CM | POA: Diagnosis not present

## 2018-03-22 DIAGNOSIS — Z791 Long term (current) use of non-steroidal anti-inflammatories (NSAID): Secondary | ICD-10-CM | POA: Diagnosis not present

## 2018-03-22 DIAGNOSIS — I10 Essential (primary) hypertension: Secondary | ICD-10-CM | POA: Diagnosis not present

## 2018-03-22 DIAGNOSIS — Z9181 History of falling: Secondary | ICD-10-CM | POA: Diagnosis not present

## 2018-03-22 DIAGNOSIS — H409 Unspecified glaucoma: Secondary | ICD-10-CM | POA: Diagnosis not present

## 2018-03-22 DIAGNOSIS — M8978 Major osseous defect, other site: Secondary | ICD-10-CM | POA: Diagnosis not present

## 2018-03-22 DIAGNOSIS — L02415 Cutaneous abscess of right lower limb: Secondary | ICD-10-CM | POA: Diagnosis not present

## 2018-03-22 DIAGNOSIS — Z794 Long term (current) use of insulin: Secondary | ICD-10-CM | POA: Diagnosis not present

## 2018-03-23 DIAGNOSIS — L03115 Cellulitis of right lower limb: Secondary | ICD-10-CM | POA: Diagnosis not present

## 2018-03-23 DIAGNOSIS — T84050D Periprosthetic osteolysis of internal prosthetic right hip joint, subsequent encounter: Secondary | ICD-10-CM | POA: Diagnosis not present

## 2018-03-23 DIAGNOSIS — M8978 Major osseous defect, other site: Secondary | ICD-10-CM | POA: Diagnosis not present

## 2018-03-23 DIAGNOSIS — I10 Essential (primary) hypertension: Secondary | ICD-10-CM | POA: Diagnosis not present

## 2018-03-23 DIAGNOSIS — Z794 Long term (current) use of insulin: Secondary | ICD-10-CM | POA: Diagnosis not present

## 2018-03-23 DIAGNOSIS — Z9181 History of falling: Secondary | ICD-10-CM | POA: Diagnosis not present

## 2018-03-23 DIAGNOSIS — L02415 Cutaneous abscess of right lower limb: Secondary | ICD-10-CM | POA: Diagnosis not present

## 2018-03-23 DIAGNOSIS — Z791 Long term (current) use of non-steroidal anti-inflammatories (NSAID): Secondary | ICD-10-CM | POA: Diagnosis not present

## 2018-03-23 DIAGNOSIS — H409 Unspecified glaucoma: Secondary | ICD-10-CM | POA: Diagnosis not present

## 2018-03-23 DIAGNOSIS — E119 Type 2 diabetes mellitus without complications: Secondary | ICD-10-CM | POA: Diagnosis not present

## 2018-03-26 DIAGNOSIS — I10 Essential (primary) hypertension: Secondary | ICD-10-CM | POA: Diagnosis not present

## 2018-03-26 DIAGNOSIS — E119 Type 2 diabetes mellitus without complications: Secondary | ICD-10-CM | POA: Diagnosis not present

## 2018-03-26 DIAGNOSIS — Z794 Long term (current) use of insulin: Secondary | ICD-10-CM | POA: Diagnosis not present

## 2018-03-26 DIAGNOSIS — Z791 Long term (current) use of non-steroidal anti-inflammatories (NSAID): Secondary | ICD-10-CM | POA: Diagnosis not present

## 2018-03-26 DIAGNOSIS — T84050D Periprosthetic osteolysis of internal prosthetic right hip joint, subsequent encounter: Secondary | ICD-10-CM | POA: Diagnosis not present

## 2018-03-26 DIAGNOSIS — Z9181 History of falling: Secondary | ICD-10-CM | POA: Diagnosis not present

## 2018-03-26 DIAGNOSIS — L03115 Cellulitis of right lower limb: Secondary | ICD-10-CM | POA: Diagnosis not present

## 2018-03-26 DIAGNOSIS — H409 Unspecified glaucoma: Secondary | ICD-10-CM | POA: Diagnosis not present

## 2018-03-26 DIAGNOSIS — L02415 Cutaneous abscess of right lower limb: Secondary | ICD-10-CM | POA: Diagnosis not present

## 2018-03-26 DIAGNOSIS — M8978 Major osseous defect, other site: Secondary | ICD-10-CM | POA: Diagnosis not present

## 2018-03-27 ENCOUNTER — Ambulatory Visit: Payer: Medicare Other | Admitting: Family

## 2018-03-27 ENCOUNTER — Ambulatory Visit (INDEPENDENT_AMBULATORY_CARE_PROVIDER_SITE_OTHER): Payer: Medicare Other | Admitting: Family Medicine

## 2018-03-27 ENCOUNTER — Encounter: Payer: Self-pay | Admitting: Family Medicine

## 2018-03-27 VITALS — BP 148/68 | HR 68 | Temp 98.5°F | Resp 16 | Wt 136.0 lb

## 2018-03-27 DIAGNOSIS — I10 Essential (primary) hypertension: Secondary | ICD-10-CM | POA: Diagnosis not present

## 2018-03-27 DIAGNOSIS — E119 Type 2 diabetes mellitus without complications: Secondary | ICD-10-CM

## 2018-03-27 DIAGNOSIS — R7989 Other specified abnormal findings of blood chemistry: Secondary | ICD-10-CM

## 2018-03-27 DIAGNOSIS — E1121 Type 2 diabetes mellitus with diabetic nephropathy: Secondary | ICD-10-CM

## 2018-03-27 DIAGNOSIS — R5383 Other fatigue: Secondary | ICD-10-CM | POA: Diagnosis not present

## 2018-03-27 DIAGNOSIS — N289 Disorder of kidney and ureter, unspecified: Secondary | ICD-10-CM

## 2018-03-27 DIAGNOSIS — L02415 Cutaneous abscess of right lower limb: Secondary | ICD-10-CM

## 2018-03-27 DIAGNOSIS — Z794 Long term (current) use of insulin: Secondary | ICD-10-CM | POA: Diagnosis not present

## 2018-03-27 DIAGNOSIS — R945 Abnormal results of liver function studies: Secondary | ICD-10-CM

## 2018-03-27 DIAGNOSIS — E7800 Pure hypercholesterolemia, unspecified: Secondary | ICD-10-CM

## 2018-03-27 DIAGNOSIS — I2510 Atherosclerotic heart disease of native coronary artery without angina pectoris: Secondary | ICD-10-CM

## 2018-03-27 NOTE — Progress Notes (Signed)
[name] Female    DOB: 02-12-1926   84 y.o.   MRN: [id number] Visit Date: 03/27/2018  Today's Provider: Wilhemena Durie, MD   Chief Complaint  Patient presents with  . Diabetes   Subjective:    HPI  Diabetes Mellitus Type II, Follow-up:   Lab Results  Component Value Date   HGBA1C 6.2 (H) 10/10/2017   HGBA1C 6.0 06/13/2017   HGBA1C 6.2 (H) 05/09/2016    Last seen for diabetes 6 months ago.  Management since then includes no changes. She reports good compliance with treatment. She is not having side effects.  Current symptoms include none and have been stable. Home blood sugar records: fasting range: 110s  Episodes of hypoglycemia? no   Current Insulin Regimen: 20 units daily.  Most Recent Eye Exam: up to date Weight trend: stable Prior visit with dietician: no Current diet: well balanced Current exercise: no regular exercise  Pertinent Labs:    Component Value Date/Time   CHOL 111 05/09/2016 1502   TRIG 96 05/09/2016 1502   HDL 40 05/09/2016 1502   LDLCALC 52 05/09/2016 1502   CREATININE 1.37 (H) 10/10/2017 0954   CREATININE 1.33 (H) 06/13/2017 1156    Wt Readings from Last 3 Encounters:  03/27/18 136 lb (61.7 kg)  06/13/17 149 lb (67.6 kg)  11/01/16 142 lb (64.4 kg)       Hypertension, follow-up:  BP Readings from Last 3 Encounters:  03/27/18 (!) 148/68  06/13/17 140/68  11/01/16 (!) 142/52    She was last seen for hypertension 6 months ago.  BP at that visit was 142/52. Management since that visit includes no changes. She reports good compliance with treatment. She is not having side effects.  She is not exercising. She is adherent to low salt diet.   Outside blood pressures are checked occasionally. She is experiencing none.  Patient denies lower extremity edema and palpitations.   Cardiovascular risk factors include diabetes mellitus.  Pt has been followed by surgery and wound care for right thigh  abscess. Allergies  Allergen Reactions  . Sulfa Antibiotics Rash     Current Outpatient Medications:  .  ACCU-CHEK AVIVA PLUS test strip, USE TO TEST SUGAR TWICE A DAY, Disp: 100 each, Rfl: 3 .  aspirin EC 325 MG EC tablet, Take 1 tablet (325 mg total) by mouth daily., Disp: 30 tablet, Rfl: 0 .  Blood Glucose Monitoring Suppl (ACCU-CHEK AVIVA PLUS) w/Device KIT, 1 each by Does not apply route daily. Dx E11.9, Disp: 1 kit, Rfl: 0 .  gabapentin (NEURONTIN) 100 MG capsule, TAKE ONE CAPSULE BY MOUTH TWICE A DAY, Disp: 180 capsule, Rfl: 3 .  glucose blood (ACCU-CHEK AVIVA PLUS) test strip, Test glucose TWICE DAILY DX: E11.9, Disp: 100 each, Rfl: 2 .  glucose blood (BAYER CONTOUR NEXT TEST) test strip, Check sugar twice daily DX E11.9, Disp: 100 each, Rfl: 12 .  hydrochlorothiazide (HYDRODIURIL) 25 MG tablet, TAKE 1 TABLET BY MOUTH ONCE A DAY, Disp: 30 tablet, Rfl: 0 .  latanoprost (XALATAN) 0.005 % ophthalmic solution, PLACE 1 DROP INTO BOTH EYES EVERY NIGHT AT BEDTIME, Disp: , Rfl: 4 .  LEVEMIR FLEXTOUCH 100 UNIT/ML Pen, INJECT 20 UNITS SUBCUTANEOUSLY DAILY, Disp: 15 pen, Rfl: 0 .  meloxicam (MOBIC) 7.5 MG tablet, Take 7.5 mg by mouth daily. with food, Disp: , Rfl: 2 .  pioglitazone (ACTOS) 45 MG tablet, TAKE 1 TABLET BY MOUTH EVERY DAY, Disp: 30 tablet, Rfl: 12 .  prednisoLONE acetate (PRED FORTE) 1 % ophthalmic suspension, PLACE 1 DROP IN LEFT EYE 4 TIMES A DAY, Disp: , Rfl: 4 .  SIMBRINZA 1-0.2 % SUSP, INSTILL 1 DROP INTO BOTH EYES TWICE A DAY, Disp: , Rfl: 4 .  SURE COMFORT PEN NEEDLES 31G X 5 MM MISC, USE AS DIRECTED **PATIENT NEEDS APPOINTMENT FOR FOLLOW UP**, Disp: 100 each, Rfl: 1 .  SURE COMFORT PEN NEEDLES 31G X 5 MM MISC, USE AS DIRECTED **PATIENT NEEDS APPOINTMENT FOR FOLLOW UP**, Disp: 100 each, Rfl: 7 .  travoprost, benzalkonium, (TRAVATAN) 0.004 % ophthalmic solution, Place 1 drop into both eyes at bedtime., Disp: , Rfl:  .  brompheniramine-pseudoephedrine-DM 30-2-10 MG/5ML syrup,  Take 5 mLs by mouth 3 (three) times daily as needed. (Patient not taking: Reported on 06/13/2017), Disp: 120 mL, Rfl: 0 .  doxycycline (VIBRA-TABS) 100 MG tablet, Take 1 tablet (100 mg total) by mouth 2 (two) times daily. (Patient not taking: Reported on 06/13/2017), Disp: 14 tablet, Rfl: 0 .  hydrochlorothiazide (HYDRODIURIL) 25 MG tablet, Take 1 tablet (25 mg total) by mouth daily., Disp: 90 tablet, Rfl: 3 .  metoprolol tartrate (LOPRESSOR) 25 MG tablet, TAKE 1/2 TABLET BY MOUTH DAILY, Disp: 45 tablet, Rfl: 3  Review of Systems  Constitutional: Negative for activity change, appetite change, chills, diaphoresis, fatigue, fever and unexpected weight change.  HENT: Negative.   Eyes: Negative.   Respiratory: Negative for cough and shortness of breath.   Cardiovascular: Negative for chest pain and leg swelling.  Gastrointestinal: Negative.   Endocrine: Negative for cold intolerance, heat intolerance, polydipsia, polyphagia and polyuria.  Musculoskeletal: Positive for arthralgias. Negative for myalgias.  Skin: Positive for wound.  Allergic/Immunologic: Negative.   Neurological: Negative for dizziness and headaches.  Psychiatric/Behavioral: Negative.     Social History   Tobacco Use  . Smoking status: Never Smoker  . Smokeless tobacco: Never Used  Substance Use Topics  . Alcohol use: No   Objective:   BP (!) 148/68 (BP Location: Right Arm, Patient Position: Sitting, Cuff Size: Normal)   Pulse 68   Temp 98.5 F (36.9 C)   Resp 16   Wt 136 lb (61.7 kg)   SpO2 98%   BMI 23.90 kg/m  Vitals:   03/27/18 1352  BP: (!) 148/68  Pulse: 68  Resp: 16  Temp: 98.5 F (36.9 C)  SpO2: 98%  Weight: 136 lb (61.7 kg)     Physical Exam  Constitutional: She appears well-developed and well-nourished.  HENT:  Head: Normocephalic and atraumatic.  Right Ear: External ear normal.  Left Ear: External ear normal.  Nose: Nose normal.  Eyes: Conjunctivae are normal. No scleral icterus.  Neck:  No thyromegaly present.  Cardiovascular: Normal rate, regular rhythm and normal heart sounds.  Pulmonary/Chest: Effort normal and breath sounds normal.  Abdominal: Soft.  Musculoskeletal: She exhibits no edema.  Skin: Skin is warm and dry.  Psychiatric: She has a normal mood and affect. Her behavior is normal. Judgment and thought content normal.        Assessment & Plan:     1. Type 2 diabetes mellitus without complication, with long-term current use of insulin (HCC)  - POCT glycosylated hemoglobin (Hb A1C) - Hemoglobin A1c  2. Essential hypertension  - Comprehensive metabolic panel  3. Other fatigue  - CBC with Differential/Platelet - TSH 4.CAD 5.Diabetic nephropathy 6.Anemia of chronic disease Will follow clinically==pt asymptomatic. 7.Thigh Abscess 8.Osteoarthritis Rx written for [facility].     I have done the exam  and reviewed the above chart and it is accurate to the best of my knowledge. Development worker, community has been used in this note in any air is in the dictation or transcription are unintentional.  Wilhemena Durie, MD  [place]

## 2018-03-28 DIAGNOSIS — M8978 Major osseous defect, other site: Secondary | ICD-10-CM | POA: Diagnosis not present

## 2018-03-28 DIAGNOSIS — E119 Type 2 diabetes mellitus without complications: Secondary | ICD-10-CM | POA: Diagnosis not present

## 2018-03-28 DIAGNOSIS — L02415 Cutaneous abscess of right lower limb: Secondary | ICD-10-CM | POA: Diagnosis not present

## 2018-03-28 DIAGNOSIS — Z9181 History of falling: Secondary | ICD-10-CM | POA: Diagnosis not present

## 2018-03-28 DIAGNOSIS — H409 Unspecified glaucoma: Secondary | ICD-10-CM | POA: Diagnosis not present

## 2018-03-28 DIAGNOSIS — Z791 Long term (current) use of non-steroidal anti-inflammatories (NSAID): Secondary | ICD-10-CM | POA: Diagnosis not present

## 2018-03-28 DIAGNOSIS — T84050D Periprosthetic osteolysis of internal prosthetic right hip joint, subsequent encounter: Secondary | ICD-10-CM | POA: Diagnosis not present

## 2018-03-28 DIAGNOSIS — I10 Essential (primary) hypertension: Secondary | ICD-10-CM | POA: Diagnosis not present

## 2018-03-28 DIAGNOSIS — Z794 Long term (current) use of insulin: Secondary | ICD-10-CM | POA: Diagnosis not present

## 2018-03-28 DIAGNOSIS — L03115 Cellulitis of right lower limb: Secondary | ICD-10-CM | POA: Diagnosis not present

## 2018-03-28 LAB — CBC WITH DIFFERENTIAL/PLATELET
BASOS: 0 %
Basophils Absolute: 0 10*3/uL (ref 0.0–0.2)
EOS (ABSOLUTE): 0 10*3/uL (ref 0.0–0.4)
EOS: 0 %
HEMATOCRIT: 27.8 % — AB (ref 34.0–46.6)
Hemoglobin: 8.7 g/dL — ABNORMAL LOW (ref 11.1–15.9)
IMMATURE GRANULOCYTES: 0 %
Immature Grans (Abs): 0 10*3/uL (ref 0.0–0.1)
LYMPHS ABS: 1.9 10*3/uL (ref 0.7–3.1)
Lymphs: 39 %
MCH: 27.6 pg (ref 26.6–33.0)
MCHC: 31.3 g/dL — ABNORMAL LOW (ref 31.5–35.7)
MCV: 88 fL (ref 79–97)
MONOS ABS: 0.6 10*3/uL (ref 0.1–0.9)
Monocytes: 12 %
NEUTROS ABS: 2.4 10*3/uL (ref 1.4–7.0)
NEUTROS PCT: 49 %
Platelets: 164 10*3/uL (ref 150–450)
RBC: 3.15 x10E6/uL — ABNORMAL LOW (ref 3.77–5.28)
RDW: 13.7 % (ref 12.3–15.4)
WBC: 4.9 10*3/uL (ref 3.4–10.8)

## 2018-03-28 LAB — COMPREHENSIVE METABOLIC PANEL
ALBUMIN: 4.2 g/dL (ref 3.5–4.7)
ALT: 7 IU/L (ref 0–32)
AST: 13 IU/L (ref 0–40)
Albumin/Globulin Ratio: 1.2 (ref 1.2–2.2)
Alkaline Phosphatase: 102 IU/L (ref 39–117)
BUN / CREAT RATIO: 32 — AB (ref 12–28)
BUN: 46 mg/dL — ABNORMAL HIGH (ref 8–27)
Bilirubin Total: 0.4 mg/dL (ref 0.0–1.2)
CALCIUM: 9.3 mg/dL (ref 8.7–10.3)
CO2: 19 mmol/L — AB (ref 20–29)
CREATININE: 1.42 mg/dL — AB (ref 0.57–1.00)
Chloride: 108 mmol/L — ABNORMAL HIGH (ref 96–106)
GFR, EST AFRICAN AMERICAN: 38 mL/min/{1.73_m2} — AB (ref >59)
GFR, EST NON AFRICAN AMERICAN: 33 mL/min/{1.73_m2} — AB (ref >59)
GLOBULIN, TOTAL: 3.6 g/dL (ref 1.5–4.5)
Glucose: 102 mg/dL — ABNORMAL HIGH (ref 65–99)
POTASSIUM: 4.7 mmol/L (ref 3.5–5.2)
SODIUM: 142 mmol/L (ref 134–144)
Total Protein: 7.8 g/dL (ref 6.0–8.5)

## 2018-03-28 LAB — HEMOGLOBIN A1C
Est. average glucose Bld gHb Est-mCnc: 123 mg/dL
Hgb A1c MFr Bld: 5.9 % — ABNORMAL HIGH (ref 4.8–5.6)

## 2018-03-28 LAB — TSH: TSH: 1.36 u[IU]/mL (ref 0.450–4.500)

## 2018-03-29 ENCOUNTER — Telehealth: Payer: Self-pay | Admitting: Family Medicine

## 2018-03-29 ENCOUNTER — Encounter: Payer: Self-pay | Admitting: Family

## 2018-03-29 ENCOUNTER — Ambulatory Visit (INDEPENDENT_AMBULATORY_CARE_PROVIDER_SITE_OTHER): Payer: Medicare Other | Admitting: Family

## 2018-03-29 ENCOUNTER — Other Ambulatory Visit: Payer: Self-pay

## 2018-03-29 VITALS — BP 196/78 | HR 98 | Temp 98.0°F | Ht 64.0 in | Wt 136.0 lb

## 2018-03-29 DIAGNOSIS — S71109A Unspecified open wound, unspecified thigh, initial encounter: Secondary | ICD-10-CM | POA: Diagnosis not present

## 2018-03-29 DIAGNOSIS — E118 Type 2 diabetes mellitus with unspecified complications: Secondary | ICD-10-CM

## 2018-03-29 DIAGNOSIS — Z794 Long term (current) use of insulin: Principal | ICD-10-CM

## 2018-03-29 MED ORDER — ACCU-CHEK AVIVA PLUS W/DEVICE KIT: 1.0000 | PACK | Freq: Every day | 0 refills | Status: DC

## 2018-03-29 MED ORDER — DOXYCYCLINE HYCLATE 100 MG PO TABS: 100.0000 mg | ORAL_TABLET | Freq: Two times a day (BID) | ORAL | 0 refills | Status: DC

## 2018-03-29 MED ORDER — CLINDAMYCIN PHOSPHATE 1 % EX LOTN: TOPICAL_LOTION | Freq: Three times a day (TID) | CUTANEOUS | 0 refills | Status: DC

## 2018-03-29 NOTE — Patient Instructions (Addendum)
Nice to meet you.   Please continue to take your doxycycline as prescribed.  Clindamycin cream twice daily  Cleans site with soap and water daily. No peroxide or alcohol.  If your symptoms worsen before your next appointment, please let me know.  Follow up in 1 month or sooner if needed.

## 2018-03-29 NOTE — Progress Notes (Signed)
Subjective:    Patient ID: Gabriela [name], female    DOB: 1926/02/20, 84 y.o.   MRN: [id number]  Chief Complaint  Patient presents with  . Thigh Abscess    HPI:  Gabriela [name] is a 84 y.o. female who presents today for initial office visit for evaluation of chronic thigh abscess.   Gabriela [name] was initially seen by Dr. Ola Spurr on 01/15/18 for the right thigh abscess that had been going on for over 1 year. The site was incised and drained in 2018 and she completed an oral course of Bactrim and Keflex. She continued to follow wound care with difficulty getting the wound to heal. Cultures had been negative to this point. MRI in March 2018 showing stranding subcutaneous fat negative for abscess, osteomyelitis or septic joint. She is s/p right hip replacement 20 years ago. Cultures were once again obtained and positive for dipthroids. She was started on doxycycline and instructed to follow up in 2 months.   Gabriela [name] has been taking the doxycycline as prescribed with no adverse side effects. Per family, her wound is no longer draining however has increased in size initially being the size of a pin. She has had no fevers, chills or sweats. Current wound care consists of guaze and bandage.t   Allergies  Allergen Reactions  . Sulfa Antibiotics Rash      Outpatient Medications Prior to Visit  Medication Sig Dispense Refill  . aspirin EC 325 MG EC tablet Take 1 tablet (325 mg total) by mouth daily. 30 tablet 0  . brompheniramine-pseudoephedrine-DM 30-2-10 MG/5ML syrup Take 5 mLs by mouth 3 (three) times daily as needed. 120 mL 0  . gabapentin (NEURONTIN) 100 MG capsule TAKE ONE CAPSULE BY MOUTH TWICE A DAY 180 capsule 3  . glucose blood (ACCU-CHEK AVIVA PLUS) test strip Test glucose TWICE DAILY DX: E11.9 100 each 2  . hydrochlorothiazide (HYDRODIURIL) 25 MG tablet Take 1 tablet (25 mg total) by mouth daily. 90 tablet 3  . latanoprost (XALATAN) 0.005 % ophthalmic solution PLACE 1 DROP  INTO BOTH EYES EVERY NIGHT AT BEDTIME  4  . LEVEMIR FLEXTOUCH 100 UNIT/ML Pen INJECT 20 UNITS SUBCUTANEOUSLY DAILY 15 pen 0  . meloxicam (MOBIC) 7.5 MG tablet Take 7.5 mg by mouth daily. with food  2  . metoprolol tartrate (LOPRESSOR) 25 MG tablet TAKE 1/2 TABLET BY MOUTH DAILY 45 tablet 3  . pioglitazone (ACTOS) 45 MG tablet TAKE 1 TABLET BY MOUTH EVERY DAY 30 tablet 12  . prednisoLONE acetate (PRED FORTE) 1 % ophthalmic suspension PLACE 1 DROP IN LEFT EYE 4 TIMES A DAY  4  . SIMBRINZA 1-0.2 % SUSP INSTILL 1 DROP INTO BOTH EYES TWICE A DAY  4  . SURE COMFORT PEN NEEDLES 31G X 5 MM MISC USE AS DIRECTED **PATIENT NEEDS APPOINTMENT FOR FOLLOW UP** 100 each 7  . travoprost, benzalkonium, (TRAVATAN) 0.004 % ophthalmic solution Place 1 drop into both eyes at bedtime.    Marland Kitchen ACCU-CHEK AVIVA PLUS test strip USE TO TEST SUGAR TWICE A DAY 100 each 3  . Blood Glucose Monitoring Suppl (ACCU-CHEK AVIVA PLUS) w/Device KIT 1 each by Does not apply route daily. Dx E11.9 1 kit 0  . doxycycline (VIBRA-TABS) 100 MG tablet Take 1 tablet (100 mg total) by mouth 2 (two) times daily. 14 tablet 0  . glucose blood (BAYER CONTOUR NEXT TEST) test strip Check sugar twice daily DX E11.9 100 each 12  . hydrochlorothiazide (HYDRODIURIL) 25 MG tablet TAKE  1 TABLET BY MOUTH ONCE A DAY 30 tablet 0  . SURE COMFORT PEN NEEDLES 31G X 5 MM MISC USE AS DIRECTED **PATIENT NEEDS APPOINTMENT FOR FOLLOW UP** 100 each 1   No facility-administered medications prior to visit.      Past Medical History:  Diagnosis Date  . Arthritis   . Diabetes mellitus ([place])   . Glaucoma   . Hyperlipidemia   . Hypertension   . Shortness of breath    with exertion      Past Surgical History:  Procedure Laterality Date  . CARDIAC CATHETERIZATION  04/22/13  . CHOLECYSTECTOMY    . CORONARY ARTERY BYPASS GRAFT N/A 05/31/2013   Procedure: Coronary Artery Bypass Grafting Times Three Using Left Internal Mammary Artery and Right Saphenous Leg Vein  Harvested Endoscopically;  Surgeon: Ivin Poot, MD;  Location: [facility];  Service: Open Heart Surgery;  Laterality: N/A;  . HIP SURGERY     right  . INTRAOPERATIVE TRANSESOPHAGEAL ECHOCARDIOGRAM N/A 05/31/2013   Procedure: INTRAOPERATIVE TRANSESOPHAGEAL ECHOCARDIOGRAM;  Surgeon: Ivin Poot, MD;  Location: [facility];  Service: Open Heart Surgery;  Laterality: N/A;  . JOINT REPLACEMENT    . MITRAL VALVE REPAIR N/A 05/31/2013   Procedure: MITRAL VALVE REPAIR (MVR);  Surgeon: Ivin Poot, MD;  Location: [facility];  Service: Open Heart Surgery;  Laterality: N/A;  . TONSILLECTOMY        Family History  Problem Relation Age of Onset  . Diabetes Sister   . Hypertension Mother       Social History   Socioeconomic History  . Marital status: Widowed    Spouse name: Not on file  . Number of children: 7  . Years of education: Not on file  . Highest education level: Not on file  Occupational History  . Occupation: retired  Scientific laboratory technician  . Financial resource strain: Not on file  . Food insecurity:    Worry: Not on file    Inability: Not on file  . Transportation needs:    Medical: Not on file    Non-medical: Not on file  Tobacco Use  . Smoking status: Never Smoker  . Smokeless tobacco: Never Used  Substance and Sexual Activity  . Alcohol use: No  . Drug use: No  . Sexual activity: Never  Lifestyle  . Physical activity:    Days per week: Not on file    Minutes per session: Not on file  . Stress: Not on file  Relationships  . Social connections:    Talks on phone: Not on file    Gets together: Not on file    Attends religious service: Not on file    Active member of club or organization: Not on file    Attends meetings of clubs or organizations: Not on file    Relationship status: Not on file  . Intimate partner violence:    Fear of current or ex partner: Not on file    Emotionally abused: Not on file    Physically abused: Not on file    Forced sexual activity: Not on  file  Other Topics Concern  . Not on file  Social History Narrative  . Not on file      Review of Systems  Constitutional: Negative for chills, diaphoresis, fatigue and fever.  Respiratory: Negative for cough, chest tightness and shortness of breath.   Cardiovascular: Negative for chest pain.  Skin: Positive for wound.       Objective:  BP (!) 196/78   Pulse 98   Temp 98 F (36.7 C)   Ht _0  (1.626 m)   Wt 136 lb (61.7 kg)   BMI 23.34 kg/m  Nursing note and vital signs reviewed.  Physical Exam  Constitutional: She is oriented to person, place, and time. She appears well-developed and well-nourished. No distress.  Cardiovascular: Normal rate, regular rhythm and intact distal pulses. Exam reveals no gallop and no friction rub.  Murmur heard. Pulmonary/Chest: Effort normal and breath sounds normal. No stridor. No respiratory distress. She has no wheezes. She has no rales. She exhibits no tenderness.  Neurological: She is alert and oriented to person, place, and time.  Skin: Skin is warm and dry.  Psychiatric: She has a normal mood and affect. Her behavior is normal. Judgment and thought content normal.        Red 1.75 cm x 1.75 cm annular wound with induration noted surrounding the site. Appears superficial with no significant depth. Borders appear dark with no evidence of necrosis.  There is mild discomfort. No drainage or odor is noted.       Assessment & Plan:   Problem List Items Addressed This Visit      Other   Wound of thigh - Primary    Gabriela [name] has a wound located on her right thigh that has been complicated to heal. She is currently taking doxycyline for positive dipthroids culture. Question possible resistance as speciation was not performed. There is no drainage currently. Will start clindamycin cream in addition to doxycycline. May consider recommending Medihoney in the near future to help with debridement and healing if indicated. No systemic  symptoms and minimal concern for right hip at this time although has to remain a consideration. Follow up in one month or sooner if needed.       Relevant Medications   clindamycin (CLEOCIN T) 1 % lotion   doxycycline (VIBRA-TABS) 100 MG tablet       I am having Ofilia Neas. Wachtel start on clindamycin. I am also having her maintain her travoprost (benzalkonium), aspirin, meloxicam, SIMBRINZA, prednisoLONE acetate, gabapentin, latanoprost, brompheniramine-pseudoephedrine-DM, glucose blood, hydrochlorothiazide, pioglitazone, metoprolol tartrate, SURE COMFORT PEN NEEDLES, LEVEMIR FLEXTOUCH, and doxycycline.   Meds ordered this encounter  Medications  . clindamycin (CLEOCIN T) 1 % lotion    Sig: Apply topically 3 (three) times daily.    Dispense:  60 mL    Refill:  0    Order Specific Question:   Supervising Provider    Answer:   Carlyle Basques [[id number]]  . doxycycline (VIBRA-TABS) 100 MG tablet    Sig: Take 1 tablet (100 mg total) by mouth 2 (two) times daily.    Dispense:  60 tablet    Refill:  0    Order Specific Question:   Supervising Provider    Answer:   Carlyle Basques [[id number]]     Follow-up: Return in about 1 month (around 04/29/2018), or if symptoms worsen or fail to improve.    Terri Piedra, MSN, FNP-C Nurse Practitioner [facility] Office phone: [phone] Pager: [place] number: [phone]

## 2018-03-29 NOTE — Telephone Encounter (Signed)
Pt needs to order a new blood glucose meter kit.  Hers is not working any longer,  [facility] [phone]  Thanks Con Memos

## 2018-03-30 ENCOUNTER — Encounter: Payer: Self-pay | Admitting: Family

## 2018-03-30 DIAGNOSIS — S71109A Unspecified open wound, unspecified thigh, initial encounter: Secondary | ICD-10-CM | POA: Insufficient documentation

## 2018-03-30 NOTE — Assessment & Plan Note (Signed)
Gabriela [name] has a wound located on her right thigh that has been complicated to heal. She is currently taking doxycyline for positive dipthroids culture. Question possible resistance as speciation was not performed. There is no drainage currently. Will start clindamycin cream in addition to doxycycline. May consider recommending Medihoney in the near future to help with debridement and healing if indicated. No systemic symptoms and minimal concern for right hip at this time although has to remain a consideration. Follow up in one month or sooner if needed.

## 2018-04-03 DIAGNOSIS — L02415 Cutaneous abscess of right lower limb: Secondary | ICD-10-CM | POA: Diagnosis not present

## 2018-04-03 DIAGNOSIS — M8978 Major osseous defect, other site: Secondary | ICD-10-CM | POA: Diagnosis not present

## 2018-04-03 DIAGNOSIS — T84050D Periprosthetic osteolysis of internal prosthetic right hip joint, subsequent encounter: Secondary | ICD-10-CM | POA: Diagnosis not present

## 2018-04-03 DIAGNOSIS — L03115 Cellulitis of right lower limb: Secondary | ICD-10-CM | POA: Diagnosis not present

## 2018-04-03 DIAGNOSIS — H409 Unspecified glaucoma: Secondary | ICD-10-CM | POA: Diagnosis not present

## 2018-04-03 DIAGNOSIS — Z794 Long term (current) use of insulin: Secondary | ICD-10-CM | POA: Diagnosis not present

## 2018-04-03 DIAGNOSIS — I10 Essential (primary) hypertension: Secondary | ICD-10-CM | POA: Diagnosis not present

## 2018-04-03 DIAGNOSIS — Z791 Long term (current) use of non-steroidal anti-inflammatories (NSAID): Secondary | ICD-10-CM | POA: Diagnosis not present

## 2018-04-03 DIAGNOSIS — E119 Type 2 diabetes mellitus without complications: Secondary | ICD-10-CM | POA: Diagnosis not present

## 2018-04-03 DIAGNOSIS — Z9181 History of falling: Secondary | ICD-10-CM | POA: Diagnosis not present

## 2018-04-05 DIAGNOSIS — H401112 Primary open-angle glaucoma, right eye, moderate stage: Secondary | ICD-10-CM | POA: Diagnosis not present

## 2018-04-05 DIAGNOSIS — Z961 Presence of intraocular lens: Secondary | ICD-10-CM | POA: Diagnosis not present

## 2018-04-05 DIAGNOSIS — H401123 Primary open-angle glaucoma, left eye, severe stage: Secondary | ICD-10-CM | POA: Diagnosis not present

## 2018-04-19 ENCOUNTER — Other Ambulatory Visit: Payer: Self-pay | Admitting: Family Medicine

## 2018-05-07 ENCOUNTER — Telehealth: Payer: Self-pay | Admitting: Family Medicine

## 2018-05-07 NOTE — Telephone Encounter (Signed)
Patient should be seen

## 2018-05-07 NOTE — Telephone Encounter (Signed)
Daughter Vickii Chafe called saying mom's feet, legs, and is swollen.  No other symptoms.  She is not on fluid pills.  Please advise   [phone]  Thanks teri

## 2018-05-08 ENCOUNTER — Other Ambulatory Visit: Payer: Self-pay | Admitting: Family

## 2018-05-08 DIAGNOSIS — S71109A Unspecified open wound, unspecified thigh, initial encounter: Secondary | ICD-10-CM

## 2018-05-08 NOTE — Telephone Encounter (Signed)
Appt made.  Pt coming in Wed 25th to see Dr. Rosanna Randy

## 2018-05-09 ENCOUNTER — Ambulatory Visit (INDEPENDENT_AMBULATORY_CARE_PROVIDER_SITE_OTHER): Payer: Medicare Other | Admitting: Family Medicine

## 2018-05-09 ENCOUNTER — Encounter: Payer: Self-pay | Admitting: Family Medicine

## 2018-05-09 VITALS — BP 148/60 | HR 61 | Temp 97.6°F | Resp 16 | Wt 145.4 lb

## 2018-05-09 DIAGNOSIS — Z794 Long term (current) use of insulin: Secondary | ICD-10-CM

## 2018-05-09 DIAGNOSIS — I509 Heart failure, unspecified: Secondary | ICD-10-CM | POA: Diagnosis not present

## 2018-05-09 DIAGNOSIS — N289 Disorder of kidney and ureter, unspecified: Secondary | ICD-10-CM | POA: Diagnosis not present

## 2018-05-09 DIAGNOSIS — I2510 Atherosclerotic heart disease of native coronary artery without angina pectoris: Secondary | ICD-10-CM | POA: Diagnosis not present

## 2018-05-09 DIAGNOSIS — E785 Hyperlipidemia, unspecified: Secondary | ICD-10-CM | POA: Diagnosis not present

## 2018-05-09 DIAGNOSIS — E118 Type 2 diabetes mellitus with unspecified complications: Secondary | ICD-10-CM | POA: Diagnosis not present

## 2018-05-09 NOTE — Patient Instructions (Addendum)
Stop Meloxicam, Pioglitazone, decrease Levemir from 20 to 15U.

## 2018-05-09 NOTE — Progress Notes (Signed)
[name] Female    DOB: 1926/04/01   84 y.o.   MRN: [id number] Visit Date: 05/09/2018  Today's Provider: Wilhemena Durie, MD   Chief Complaint  Patient presents with  . Edema   Subjective:    HPI   Patient comes in office today with concerns today of swelling to her face and lower extremities for the past two weeks. Patient denies any changes to medication. Patient reports that he has been trying to elevate legs in the evenings but it does not help improve swelling.  No chest pain /PND/orthopnea.  Allergies  Allergen Reactions  . Sulfa Antibiotics Rash     Current Outpatient Medications:  .  ACCU-CHEK AVIVA PLUS test strip, USE TO TEST SUGAR TWICE A DAY, Disp: 100 each, Rfl: 5 .  aspirin EC 325 MG EC tablet, Take 1 tablet (325 mg total) by mouth daily., Disp: 30 tablet, Rfl: 0 .  Blood Glucose Monitoring Suppl (ACCU-CHEK AVIVA PLUS) w/Device KIT, 1 each by Does not apply route daily. Dx E11.9, Disp: 1 kit, Rfl: 0 .  brompheniramine-pseudoephedrine-DM 30-2-10 MG/5ML syrup, Take 5 mLs by mouth 3 (three) times daily as needed., Disp: 120 mL, Rfl: 0 .  clindamycin (CLEOCIN T) 1 % lotion, Apply topically 3 (three) times daily., Disp: 60 mL, Rfl: 0 .  doxycycline (VIBRA-TABS) 100 MG tablet, Take 1 tablet (100 mg total) by mouth 2 (two) times daily., Disp: 60 tablet, Rfl: 0 .  gabapentin (NEURONTIN) 100 MG capsule, TAKE ONE CAPSULE BY MOUTH TWICE A DAY, Disp: 180 capsule, Rfl: 3 .  glucose blood (ACCU-CHEK AVIVA PLUS) test strip, Test glucose TWICE DAILY DX: E11.9, Disp: 100 each, Rfl: 2 .  hydrochlorothiazide (HYDRODIURIL) 25 MG tablet, Take 1 tablet (25 mg total) by mouth daily., Disp: 90 tablet, Rfl: 3 .  latanoprost (XALATAN) 0.005 % ophthalmic solution, PLACE 1 DROP INTO BOTH EYES EVERY NIGHT AT BEDTIME, Disp: , Rfl: 4 .  LEVEMIR FLEXTOUCH 100 UNIT/ML Pen, INJECT 20 UNITS SUBCUTANEOUSLY DAILY, Disp: 15 pen, Rfl: 0 .  meloxicam (MOBIC) 7.5 MG tablet, Take  7.5 mg by mouth daily. with food, Disp: , Rfl: 2 .  metoprolol tartrate (LOPRESSOR) 25 MG tablet, TAKE 1/2 TABLET BY MOUTH DAILY, Disp: 45 tablet, Rfl: 3 .  pioglitazone (ACTOS) 45 MG tablet, TAKE 1 TABLET BY MOUTH EVERY DAY, Disp: 30 tablet, Rfl: 12 .  prednisoLONE acetate (PRED FORTE) 1 % ophthalmic suspension, PLACE 1 DROP IN LEFT EYE 4 TIMES A DAY, Disp: , Rfl: 4 .  SIMBRINZA 1-0.2 % SUSP, INSTILL 1 DROP INTO BOTH EYES TWICE A DAY, Disp: , Rfl: 4 .  SURE COMFORT PEN NEEDLES 31G X 5 MM MISC, USE AS DIRECTED **PATIENT NEEDS APPOINTMENT FOR FOLLOW UP**, Disp: 100 each, Rfl: 7 .  travoprost, benzalkonium, (TRAVATAN) 0.004 % ophthalmic solution, Place 1 drop into both eyes at bedtime., Disp: , Rfl:   Review of Systems  Constitutional: Negative.   HENT: Negative.   Eyes: Negative.   Respiratory: Negative.   Cardiovascular: Positive for leg swelling.  Gastrointestinal: Negative.   Endocrine: Negative.   Musculoskeletal: Positive for back pain and joint swelling. Negative for arthralgias, myalgias, neck pain and neck stiffness.  Skin: Negative.   Allergic/Immunologic: Negative.   Neurological: Negative.   Psychiatric/Behavioral: Negative.     Social History   Tobacco Use  . Smoking status: Never Smoker  . Smokeless tobacco: Never Used  Substance Use Topics  . Alcohol use: No  Objective:   BP (!) 148/60   Pulse 61   Temp 97.6 F (36.4 C) (Oral)   Resp 16   Wt 145 lb 6.4 oz (66 kg)   SpO2 98%   BMI 24.96 kg/m  Vitals:   05/09/18 1007  BP: (!) 148/60  Pulse: 61  Resp: 16  Temp: 97.6 F (36.4 C)  TempSrc: Oral  SpO2: 98%  Weight: 145 lb 6.4 oz (66 kg)     Physical Exam  Constitutional: She is oriented to person, place, and time. She appears well-developed and well-nourished.  HENT:  Head: Normocephalic and atraumatic.  Right Ear: External ear normal.  Left Ear: External ear normal.  Nose: Nose normal.  Eyes: Conjunctivae are normal. No scleral icterus.  Neck: No  thyromegaly present.  Cardiovascular: Normal rate, regular rhythm and normal heart sounds.  Pulmonary/Chest: Effort normal and breath sounds normal.  Abdominal: Soft.  Musculoskeletal: She exhibits edema.  Trace LE edema.  Neurological: She is alert and oriented to person, place, and time.  Skin: Skin is warm and dry.  Psychiatric: She has a normal mood and affect. Her behavior is normal. Judgment and thought content normal.        Assessment & Plan:     1. Type 2 diabetes mellitus with complication, with long-term current use of insulin ([place])   2. Hyperlipidemia, unspecified hyperlipidemia type  - EKG 12-Lead - Comprehensive metabolic panel  3. Left main coronary artery disease S/p CABG - TSH - Pro b natriuretic peptide (BNP)  4. Disorder of kidney/CKD More than 50% of 30 minute visit spent discussing issues with pt/daughter,  - CBC with Differential/Platelet - Comprehensive metabolic panel 5.Possible mild CHF 6.Edema Stop Actos/Mobic.RTC 1 week.     I have done the exam and reviewed the chart and it is accurate to the best of my knowledge. Development worker, community has been used and  any errors in dictation or transcription are unintentional. Miguel Aschoff M.D. [place], MD  [facility]

## 2018-05-10 LAB — CBC WITH DIFFERENTIAL/PLATELET
BASOS ABS: 0 10*3/uL (ref 0.0–0.2)
Basos: 0 %
EOS (ABSOLUTE): 0 10*3/uL (ref 0.0–0.4)
Eos: 0 %
HEMATOCRIT: 26.9 % — AB (ref 34.0–46.6)
Hemoglobin: 8.5 g/dL — ABNORMAL LOW (ref 11.1–15.9)
Immature Grans (Abs): 0 10*3/uL (ref 0.0–0.1)
Immature Granulocytes: 0 %
LYMPHS ABS: 1.8 10*3/uL (ref 0.7–3.1)
Lymphs: 36 %
MCH: 27 pg (ref 26.6–33.0)
MCHC: 31.6 g/dL (ref 31.5–35.7)
MCV: 85 fL (ref 79–97)
MONOS ABS: 0.6 10*3/uL (ref 0.1–0.9)
Monocytes: 12 %
Neutrophils Absolute: 2.5 10*3/uL (ref 1.4–7.0)
Neutrophils: 52 %
Platelets: 129 10*3/uL — ABNORMAL LOW (ref 150–450)
RBC: 3.15 x10E6/uL — AB (ref 3.77–5.28)
RDW: 14.3 % (ref 12.3–15.4)
WBC: 4.9 10*3/uL (ref 3.4–10.8)

## 2018-05-10 LAB — PRO B NATRIURETIC PEPTIDE: NT-Pro BNP: 3949 pg/mL — ABNORMAL HIGH (ref 0–738)

## 2018-05-10 LAB — COMPREHENSIVE METABOLIC PANEL
ALK PHOS: 96 IU/L (ref 39–117)
ALT: 10 IU/L (ref 0–32)
AST: 18 IU/L (ref 0–40)
Albumin/Globulin Ratio: 0.9 — ABNORMAL LOW (ref 1.2–2.2)
Albumin: 3.3 g/dL — ABNORMAL LOW (ref 3.5–4.7)
BUN/Creatinine Ratio: 29 — ABNORMAL HIGH (ref 12–28)
BUN: 50 mg/dL — ABNORMAL HIGH (ref 8–27)
Bilirubin Total: 0.4 mg/dL (ref 0.0–1.2)
CO2: 16 mmol/L — AB (ref 20–29)
CREATININE: 1.72 mg/dL — AB (ref 0.57–1.00)
Calcium: 8.3 mg/dL — ABNORMAL LOW (ref 8.7–10.3)
Chloride: 108 mmol/L — ABNORMAL HIGH (ref 96–106)
GFR calc Af Amer: 30 mL/min/{1.73_m2} — ABNORMAL LOW (ref >59)
GFR calc non Af Amer: 26 mL/min/{1.73_m2} — ABNORMAL LOW (ref >59)
GLOBULIN, TOTAL: 3.6 g/dL (ref 1.5–4.5)
GLUCOSE: 93 mg/dL (ref 65–99)
Potassium: 4.2 mmol/L (ref 3.5–5.2)
SODIUM: 138 mmol/L (ref 134–144)
Total Protein: 6.9 g/dL (ref 6.0–8.5)

## 2018-05-10 LAB — TSH: TSH: 1.98 u[IU]/mL (ref 0.450–4.500)

## 2018-05-14 ENCOUNTER — Telehealth: Payer: Self-pay

## 2018-05-14 NOTE — Telephone Encounter (Signed)
Attempted to contact patient. No answer phone was a busy signal.

## 2018-05-14 NOTE — Telephone Encounter (Signed)
-----   Message from Jerrol Banana., MD sent at 05/10/2018  5:36 PM EDT ----- Start Lasix 20mg  q am--RTC next week.

## 2018-05-15 NOTE — Telephone Encounter (Signed)
No answer

## 2018-05-16 ENCOUNTER — Ambulatory Visit: Payer: Self-pay | Admitting: Family Medicine

## 2018-05-17 ENCOUNTER — Ambulatory Visit: Payer: Medicare Other | Admitting: Family

## 2018-05-17 ENCOUNTER — Other Ambulatory Visit: Payer: Self-pay

## 2018-05-17 MED ORDER — FUROSEMIDE 20 MG PO TABS: 20.0000 mg | ORAL_TABLET | Freq: Every day | ORAL | 3 refills | Status: DC

## 2018-05-17 NOTE — Telephone Encounter (Signed)
Rx sent to pharmacy and patient rescheduled appointment

## 2018-05-19 ENCOUNTER — Encounter: Payer: Self-pay | Admitting: Emergency Medicine

## 2018-05-19 ENCOUNTER — Emergency Department
Admission: EM | Admit: 2018-05-19 | Discharge: 2018-05-19 | Disposition: A | Discharge status: Home / Self Care | Payer: Medicare Other | Attending: Emergency Medicine | Admitting: Emergency Medicine

## 2018-05-19 ENCOUNTER — Other Ambulatory Visit: Payer: Self-pay

## 2018-05-19 ENCOUNTER — Emergency Department: Payer: Medicare Other

## 2018-05-19 DIAGNOSIS — E1122 Type 2 diabetes mellitus with diabetic chronic kidney disease: Secondary | ICD-10-CM | POA: Insufficient documentation

## 2018-05-19 DIAGNOSIS — Z7982 Long term (current) use of aspirin: Secondary | ICD-10-CM | POA: Diagnosis not present

## 2018-05-19 DIAGNOSIS — M25511 Pain in right shoulder: Secondary | ICD-10-CM | POA: Diagnosis not present

## 2018-05-19 DIAGNOSIS — M25512 Pain in left shoulder: Secondary | ICD-10-CM | POA: Diagnosis not present

## 2018-05-19 DIAGNOSIS — S79911A Unspecified injury of right hip, initial encounter: Secondary | ICD-10-CM | POA: Diagnosis not present

## 2018-05-19 DIAGNOSIS — S199XXA Unspecified injury of neck, initial encounter: Secondary | ICD-10-CM | POA: Diagnosis not present

## 2018-05-19 DIAGNOSIS — Z79899 Other long term (current) drug therapy: Secondary | ICD-10-CM | POA: Insufficient documentation

## 2018-05-19 DIAGNOSIS — I2510 Atherosclerotic heart disease of native coronary artery without angina pectoris: Secondary | ICD-10-CM | POA: Insufficient documentation

## 2018-05-19 DIAGNOSIS — M25551 Pain in right hip: Secondary | ICD-10-CM | POA: Diagnosis not present

## 2018-05-19 DIAGNOSIS — S8991XA Unspecified injury of right lower leg, initial encounter: Secondary | ICD-10-CM | POA: Diagnosis not present

## 2018-05-19 DIAGNOSIS — N189 Chronic kidney disease, unspecified: Secondary | ICD-10-CM | POA: Insufficient documentation

## 2018-05-19 DIAGNOSIS — I129 Hypertensive chronic kidney disease with stage 1 through stage 4 chronic kidney disease, or unspecified chronic kidney disease: Secondary | ICD-10-CM | POA: Diagnosis not present

## 2018-05-19 DIAGNOSIS — R778 Other specified abnormalities of plasma proteins: Secondary | ICD-10-CM

## 2018-05-19 DIAGNOSIS — M25561 Pain in right knee: Secondary | ICD-10-CM | POA: Diagnosis not present

## 2018-05-19 DIAGNOSIS — M542 Cervicalgia: Secondary | ICD-10-CM | POA: Diagnosis not present

## 2018-05-19 DIAGNOSIS — S4992XA Unspecified injury of left shoulder and upper arm, initial encounter: Secondary | ICD-10-CM | POA: Diagnosis not present

## 2018-05-19 DIAGNOSIS — R7989 Other specified abnormal findings of blood chemistry: Secondary | ICD-10-CM

## 2018-05-19 DIAGNOSIS — Z794 Long term (current) use of insulin: Secondary | ICD-10-CM | POA: Diagnosis not present

## 2018-05-19 DIAGNOSIS — S4991XA Unspecified injury of right shoulder and upper arm, initial encounter: Secondary | ICD-10-CM | POA: Diagnosis not present

## 2018-05-19 DIAGNOSIS — S299XXA Unspecified injury of thorax, initial encounter: Secondary | ICD-10-CM | POA: Diagnosis not present

## 2018-05-19 DIAGNOSIS — R52 Pain, unspecified: Secondary | ICD-10-CM

## 2018-05-19 DIAGNOSIS — W19XXXA Unspecified fall, initial encounter: Secondary | ICD-10-CM

## 2018-05-19 LAB — CBC WITH DIFFERENTIAL/PLATELET
BASOS PCT: 1 %
Basophils Absolute: 0 10*3/uL (ref 0–0.1)
EOS ABS: 0 10*3/uL (ref 0–0.7)
Eosinophils Relative: 0 %
HEMATOCRIT: 27.4 % — AB (ref 35.0–47.0)
Hemoglobin: 9.1 g/dL — ABNORMAL LOW (ref 12.0–16.0)
LYMPHS ABS: 1.6 10*3/uL (ref 1.0–3.6)
Lymphocytes Relative: 22 %
MCH: 28.7 pg (ref 26.0–34.0)
MCHC: 33.1 g/dL (ref 32.0–36.0)
MCV: 86.8 fL (ref 80.0–100.0)
MONOS PCT: 18 %
Monocytes Absolute: 1.3 10*3/uL — ABNORMAL HIGH (ref 0.2–0.9)
NEUTROS PCT: 59 %
Neutro Abs: 4.3 10*3/uL (ref 1.4–6.5)
Platelets: 153 10*3/uL (ref 150–440)
RBC: 3.15 MIL/uL — AB (ref 3.80–5.20)
RDW: 15.8 % — ABNORMAL HIGH (ref 11.5–14.5)
WBC: 7.2 10*3/uL (ref 3.6–11.0)

## 2018-05-19 LAB — BASIC METABOLIC PANEL
Anion gap: 9 (ref 5–15)
BUN: 47 mg/dL — ABNORMAL HIGH (ref 8–23)
CHLORIDE: 108 mmol/L (ref 98–111)
CO2: 23 mmol/L (ref 22–32)
Calcium: 8.6 mg/dL — ABNORMAL LOW (ref 8.9–10.3)
Creatinine, Ser: 1.74 mg/dL — ABNORMAL HIGH (ref 0.44–1.00)
GFR calc non Af Amer: 25 mL/min — ABNORMAL LOW (ref >60)
GFR, EST AFRICAN AMERICAN: 29 mL/min — AB (ref >60)
Glucose, Bld: 213 mg/dL — ABNORMAL HIGH (ref 70–99)
POTASSIUM: 4.1 mmol/L (ref 3.5–5.1)
SODIUM: 140 mmol/L (ref 135–145)

## 2018-05-19 LAB — TROPONIN I
TROPONIN I: 0.03 ng/mL — AB (ref <0.03)
TROPONIN I: 0.03 ng/mL — AB (ref <0.03)

## 2018-05-19 MED ORDER — TRAMADOL HCL 50 MG PO TABS: 25.0000 mg | ORAL_TABLET | Freq: Four times a day (QID) | ORAL | 0 refills | Status: DC | PRN

## 2018-05-19 MED ORDER — INSULIN DETEMIR 100 UNIT/ML ~~LOC~~ SOLN
20.0000 [IU] | Freq: Once | SUBCUTANEOUS | Status: AC
  Administered 2018-05-19: 20 [IU] via SUBCUTANEOUS
  Filled 2018-05-19: qty 0.2

## 2018-05-19 MED ORDER — TRAMADOL HCL 50 MG PO TABS
50.0000 mg | ORAL_TABLET | Freq: Once | ORAL | Status: AC
  Administered 2018-05-19: 50 mg via ORAL
  Filled 2018-05-19: qty 1

## 2018-05-19 MED ORDER — PREDNISONE 50 MG PO TABS: 50.0000 mg | ORAL_TABLET | Freq: Every day | ORAL | 0 refills | Status: DC

## 2018-05-19 NOTE — ED Notes (Addendum)
Critical lab reported to [place], RN of troponin 0.03

## 2018-05-19 NOTE — ED Provider Notes (Signed)
-----------------------------------------   7:28 PM on 05/19/2018 -----------------------------------------   Blood pressure 134/62, pulse 67, temperature 98.7 F (37.1 C), temperature source Oral, weight 65.8 kg, SpO2 98 %.  Assuming care from [facility], [place].  In short, Gabriela [name] is a 84 y.o. female with a chief complaint of Fall .  Refer to the original H&P for additional details.    The current plan of care is to wait for labs and imaging to return.  I am assuming care from previous provider.  Patient had an overall reassuring exam.  At this time, awaiting results of labs and imaging.  Majority of patient's labs, imaging returned with reassuring results.  Patient did have a critical lab, namely positive troponin at 0.03.  On review of medical records, no previous troponin is identified.  Patient is currently asymptomatic in regards to chest pain or shortness of breath.  At this time, plan is to redraw troponin in 2 hours.  If troponin remains the same, likely incidental and patient will be discharged.  If troponin returns higher, patient will be admitted.  ----------------------------------------- 9:31 PM on 05/19/2018 -----------------------------------------  On redraw of troponin, patient had an elevated blood sugar at 213.  Patient takes long-acting Levemir daily at nighttime.  Patient takes 20 units per medical record.  At this time, patient will be given Levemir as this is her normal medication.  Still awaiting troponin for final disposition.  ----------------------------------------- 10:09 PM on 05/19/2018 -----------------------------------------  Second troponin came back at 0.03.  Patient is asymptomatic at this time.  Likely chronically elevated.  At this time, patient is stable for discharge.  I discussed the patient's labs, imaging results with patient's daughter.  Patient will have family member with her consistently for the next several days.  Patient will  be prescribed short course of prednisone for inflammation control.  Patient is a diabetic and I discussed blood glucose changes with the daughter.  In addition, patient will be prescribed 25 mg tramadol to be taken every 6 hours for pain if needed.  Patient will only have prescribed tramadol with family member present as I have discussed side effects of narcotic medication.  They verbalized understanding of same.  Darletta Moll, PA-C 05/19/18 2210    Nena Polio, MD 05/20/18 [phone]

## 2018-05-19 NOTE — ED Triage Notes (Signed)
Pt to ED via POV for fall that happened Sunday. Pt was not seen at the time of the fall. Pt states that soreness has continued to get worse. Pt is having pain in her right knee, left shoulder, and right upper chest. Pt has bruising noted to the right upper chest.

## 2018-05-19 NOTE — ED Provider Notes (Signed)
ED ECG REPORT I, Anne-Caroline Mariea Clonts, the attending physician, personally viewed and interpreted this ECG.   Date: 05/19/2018  EKG Time: 1851  Rate: 70  Rhythm: normal sinus rhythm  Axis: normal  Intervals:none  ST&T Change: No STEMI    Eula Listen, MD 05/19/18 1904

## 2018-05-19 NOTE — ED Provider Notes (Signed)
[facility] EMERGENCY DEPARTMENT Provider Note   CSN: [id number] Arrival date & time: 05/19/18  1653     History   Chief Complaint Chief Complaint  Patient presents with  . Fall    HPI Gabriela [name] is a 84 y.o. female presents to the emergency department with family members for a fall that occurred on May 13 2018.  Patient states she was at home, attempting to get out of bed when she accidentally slid out of the bed and fell on her right side, hitting her right shoulder against her dresser.  She denies hitting her head or losing consciousness.  No reports of headache nausea or vomiting.  She is complained of bilateral shoulder pain left greater than right as aching.  She is also had some left-sided neck pain that is been mild.  No weakness numbness tingling or radicular symptoms.  In the upper extremities.  She is denied any chest pain shortness of breath or dizziness or lightheadedness.  She has complained of right lateral knee pain as well, right knee pain is moderate.  Patient has a history of a right total hip arthroplasty, she denies any current buttocks, back, groin pain.  No thigh pain.  Patient saw her PCP on September 25 was noted to have bilateral peripheral edema and placed on Lasix which has significantly improved lower extremity edema.  Patient lives at home alone but has aides throughout the day as well as a sister who lives next door.  Patient is able to ambulate with a walker.  Family members were concerned because she was still having pain throughout bilateral shoulders in the right knee.  She has not had any medications for pain.  HPI  Past Medical History:  Diagnosis Date  . Arthritis   . Diabetes mellitus ([place] )   . Glaucoma   . Hyperlipidemia   . Hypertension   . Shortness of breath    with exertion    Patient Active Problem List   Diagnosis Date Noted  . Wound of thigh 03/30/2018  . History of revision of total replacement of  right hip joint 01/20/2016  . Abnormal LFTs (liver function tests) 04/17/2015  . Anemia of diabetes 04/17/2015  . Atherosclerosis of coronary artery 04/17/2015  . Diabetic kidney ([place]) 04/17/2015  . Essential (primary) hypertension 04/17/2015  . Glaucoma 04/17/2015  . HLD (hyperlipidemia) 04/17/2015  . Disorder of kidney 04/17/2015  . Arthritis, degenerative 04/17/2015  . Arthritis, hip 04/26/2013  . Left main coronary artery disease 04/26/2013  . Ischemic mitral regurgitation 04/26/2013  . Hypertension   . Diabetes mellitus ([place])   . Hyperlipidemia     Past Surgical History:  Procedure Laterality Date  . CARDIAC CATHETERIZATION  04/22/13  . CHOLECYSTECTOMY    . CORONARY ARTERY BYPASS GRAFT N/A 05/31/2013   Procedure: Coronary Artery Bypass Grafting Times Three Using Left Internal Mammary Artery and Right Saphenous Leg Vein Harvested Endoscopically;  Surgeon: Ivin Poot, MD;  Location: [facility];  Service: Open Heart Surgery;  Laterality: N/A;  . EYE SURGERY    . HIP SURGERY     right  . INTRAOPERATIVE TRANSESOPHAGEAL ECHOCARDIOGRAM N/A 05/31/2013   Procedure: INTRAOPERATIVE TRANSESOPHAGEAL ECHOCARDIOGRAM;  Surgeon: Ivin Poot, MD;  Location: [facility];  Service: Open Heart Surgery;  Laterality: N/A;  . JOINT REPLACEMENT    . MITRAL VALVE REPAIR N/A 05/31/2013   Procedure: MITRAL VALVE REPAIR (MVR);  Surgeon: Ivin Poot, MD;  Location: [facility];  Service: Open Heart  Surgery;  Laterality: N/A;  . TONSILLECTOMY       OB History   None      Home Medications    Prior to Admission medications   Medication Sig Start Date End Date Taking? Authorizing Provider  ACCU-CHEK AVIVA PLUS test strip USE TO TEST SUGAR TWICE A DAY 04/19/18   Jerrol Banana., MD  aspirin EC 325 MG EC tablet Take 1 tablet (325 mg total) by mouth daily. 06/08/13   Barrett, Erin R, PA-C  Blood Glucose Monitoring Suppl (ACCU-CHEK AVIVA PLUS) w/Device KIT 1 each by Does not apply route daily. Dx E11.9  03/29/18   Jerrol Banana., MD  brompheniramine-pseudoephedrine-DM 30-2-10 MG/5ML syrup Take 5 mLs by mouth 3 (three) times daily as needed. 10/02/15   Chrismon, Vickki Muff, PA  clindamycin (CLEOCIN T) 1 % lotion APPLY TO AFFECTED AREA 3 TIMES A DAY 05/09/18   Gold[place] Circle, FNP  doxycycline (VIBRA-TABS) 100 MG tablet Take 1 tablet (100 mg total) by mouth 2 (two) times daily. 03/29/18   [place] Circle, FNP  furosemide (LASIX) 20 MG tablet Take 1 tablet (20 mg total) by mouth daily. 05/17/18   Jerrol Banana., MD  gabapentin (NEURONTIN) 100 MG capsule TAKE ONE CAPSULE BY MOUTH TWICE A DAY 05/15/15   Jerrol Banana., MD  glucose blood (ACCU-CHEK AVIVA PLUS) test strip Test glucose TWICE DAILY DX: E11.9 09/27/16   Jerrol Banana., MD  hydrochlorothiazide (HYDRODIURIL) 25 MG tablet Take 1 tablet (25 mg total) by mouth daily. 06/15/17   Jerrol Banana., MD  latanoprost (XALATAN) 0.005 % ophthalmic solution PLACE 1 DROP INTO BOTH EYES EVERY NIGHT AT BEDTIME 09/06/15   [provider]  LEVEMIR FLEXTOUCH 100 UNIT/ML Pen INJECT 20 UNITS SUBCUTANEOUSLY DAILY 03/17/18   Birdie Sons, MD  meloxicam (MOBIC) 7.5 MG tablet Take 7.5 mg by mouth daily. with food 03/31/15   [provider]  metoprolol tartrate (LOPRESSOR) 25 MG tablet TAKE 1/2 TABLET BY MOUTH DAILY 07/20/17   Jerrol Banana., MD  pioglitazone (ACTOS) 45 MG tablet TAKE 1 TABLET BY MOUTH EVERY DAY 07/17/17   Jerrol Banana., MD  prednisoLONE acetate (PRED FORTE) 1 % ophthalmic suspension PLACE 1 DROP IN LEFT EYE 4 TIMES A DAY 03/10/15   [provider]  SIMBRINZA 1-0.2 % SUSP INSTILL 1 DROP INTO BOTH EYES TWICE A DAY 03/28/15   [provider]  SURE COMFORT PEN NEEDLES 31G X 5 MM MISC USE AS DIRECTED **PATIENT NEEDS APPOINTMENT FOR FOLLOW UP** 07/31/17   Jerrol Banana., MD  travoprost, benzalkonium, (TRAVATAN) 0.004 % ophthalmic solution Place 1 drop into both eyes at  bedtime.    [provider]    Family History Family History  Problem Relation Age of Onset  . Diabetes Sister   . Hypertension Mother     Social History Social History   Tobacco Use  . Smoking status: Never Smoker  . Smokeless tobacco: Never Used  Substance Use Topics  . Alcohol use: No  . Drug use: No     Allergies   Sulfa antibiotics   Review of Systems Review of Systems  Constitutional: Negative for activity change, chills, fatigue and fever.  HENT: Negative for congestion, sinus pressure and sore throat.   Eyes: Negative for visual disturbance.  Respiratory: Negative for cough, chest tightness and shortness of breath.   Cardiovascular: Negative for chest pain and leg swelling.  Gastrointestinal: Negative for abdominal pain, diarrhea, nausea and vomiting.  Genitourinary: Negative for dysuria.  Musculoskeletal: Positive for arthralgias, myalgias and neck pain. Negative for gait problem, joint swelling and neck stiffness.  Skin: Negative for rash.  Neurological: Negative for dizziness, weakness, numbness and headaches.  Hematological: Negative for adenopathy.  Psychiatric/Behavioral: Negative for agitation, behavioral problems and confusion.     Physical Exam Updated Vital Signs BP 134/62   Pulse 67   Temp 98.7 F (37.1 C) (Oral)   Wt 65.8 kg   SpO2 98%   BMI 24.89 kg/m   Physical Exam  Constitutional: She is oriented to person, place, and time. She appears well-developed and well-nourished. No distress.  HENT:  Head: Normocephalic and atraumatic.  Mouth/Throat: Oropharynx is clear and moist.  Eyes: Pupils are equal, round, and reactive to light. Conjunctivae and EOM are normal. Right eye exhibits no discharge. Left eye exhibits no discharge.  Neck: Normal range of motion. Neck supple.  Cardiovascular: Normal rate, regular rhythm and intact distal pulses.  Pulmonary/Chest: Effort normal and breath sounds normal. No respiratory distress. She  exhibits no tenderness.  Abdominal: Soft. She exhibits no distension. There is no tenderness. There is no guarding.  Musculoskeletal: Normal range of motion. She exhibits no edema.  Patient with no cervical thoracic or lumbar spinous process tenderness palpation.  She has mild pain with left-sided cervical rotation but normal range of motion of cervical spine.  She has good range of motion of the shoulders but is tender along the glenohumeral joints bilaterally.  She has mild tenderness to the right clavicle midshaft with mild ecchymosis noted.  No tenting of the skin.  No wounds noted.  She is nontender throughout the sternum or ribs.  Examination of the pelvis shows no tenderness palpation.  Mild stiffness with right hip internal rotation, mild stiffness with left hip internal rotation.  No pain with bilateral hip internal rotation.  Patient has knee pain that is tender to touch along the medial and lateral joint line.  She is able to straight leg raise with the right lower extremity.  Knee is stable to valgus and varus stress testing.  She has good ankle plantar flexion dorsiflexion.  No swelling or edema throughout bilateral lower extremities.  Neurological: She is alert and oriented to person, place, and time. She has normal reflexes.  Skin: Skin is warm and dry.  Psychiatric: She has a normal mood and affect. Her behavior is normal. Thought content normal.     ED Treatments / Results  Labs (all labs ordered are listed, but only abnormal results are displayed) Labs Reviewed  CBC WITH DIFFERENTIAL/PLATELET  BASIC METABOLIC PANEL  TROPONIN I    EKG None  Radiology Dg Chest 2 View  Result Date: 05/19/2018 CLINICAL DATA:  Patient reports fall out of bed 1 week ago. Reports pain to neck, bilateral shoulders, right hip, right knee and right chest wall. Denies any previous injury to any above listed site. Hx right hip replacement. EXAM: CHEST - 2 VIEW COMPARISON:  07/31/2013 FINDINGS: Cardiac  silhouette is mildly enlarged. Changes from CABG surgery and mitral valve replacement are stable. No mediastinal or hilar masses. No evidence of adenopathy. Lungs are clear.  No pleural effusion or pneumothorax. Skeletal structures are demineralized but intact. IMPRESSION: No acute cardiopulmonary disease. Electronically Signed   By: Lajean Manes M.D.   On: 05/19/2018 18:40   Dg Cervical Spine 2-3 Views  Result Date: 05/19/2018 CLINICAL DATA:  [place] out of bed 1 week  ago, neck pain, BILATERAL shoulder pain EXAM: CERVICAL SPINE - 2-3 VIEW COMPARISON:  None FINDINGS: Prevertebral soft tissues upper normal thickness at C5-C6. Bones demineralized. Diffuse disc space narrowing and endplate spur formation throughout cervical spine. Multilevel facet degenerative changes throughout cervical spine. Minimal lateral cervical flexion to the RIGHT. Vertebral body heights maintained without fracture or subluxation. No bone destruction. Prior median sternotomy. Atherosclerotic calcifications of the carotid systems bilaterally. IMPRESSION: Multilevel degenerative disc and facet disease changes of the cervical spine with lateral cervical flexion to the RIGHT. No definite acute fracture or subluxation. Electronically Signed   By: Lavonia Dana M.D.   On: 05/19/2018 18:34   Dg Shoulder Right  Result Date: 05/19/2018 CLINICAL DATA:  Patient reports fall out of bed 1 week ago. Reports pain to neck, bilateral shoulders, right hip, right knee and right chest wall. Denies any previous injury to any above listed site. Hx right hip replacement. EXAM: RIGHT SHOULDER - 2+ VIEW COMPARISON:  None. FINDINGS: No fracture, bone lesion or dislocation. Mild AC joint osteoarthritis.  Subacromial spur. Bones are diffusely demineralized. Soft tissues are unremarkable. IMPRESSION: No fracture or dislocation Electronically Signed   By: Lajean Manes M.D.   On: 05/19/2018 18:36   Dg Shoulder Left  Result Date: 05/19/2018 CLINICAL DATA:  Patient  reports fall out of bed 1 week ago. Reports pain to neck, bilateral shoulders, right hip, right knee and right chest wall. Denies any previous injury to any above listed site. Hx right hip replacement. EXAM: LEFT SHOULDER - 2+ VIEW COMPARISON:  None. FINDINGS: No fracture. No bone lesion. Glenohumeral joint normally spaced and aligned. Mild AC joint osteoarthritis. Probable small subacromial spur. Bones are diffusely demineralized. Soft tissues are unremarkable. IMPRESSION: No fracture or dislocation Electronically Signed   By: Lajean Manes M.D.   On: 05/19/2018 18:36   Dg Knee Complete 4 Views Right  Result Date: 05/19/2018 CLINICAL DATA:  Patient reports fall out of bed 1 week ago. Reports pain to neck, bilateral shoulders, right hip, right knee and right chest wall. Denies any previous injury to any above listed site. Hx right hip replacement. EXAM: RIGHT KNEE - COMPLETE 4+ VIEW COMPARISON:  None. FINDINGS: No fracture.  No bone lesion. Knee joint is normally spaced and aligned. No significant arthropathic change. No joint effusion. Bones are diffusely demineralized.  Should There are dense vascular calcifications posteriorly. IMPRESSION: No fracture or dislocation Electronically Signed   By: Lajean Manes M.D.   On: 05/19/2018 18:38   Dg Hip Unilat With Pelvis 2-3 Views Right  Result Date: 05/19/2018 CLINICAL DATA:  Patient reports fall out of bed 1 week ago. Reports pain to neck, bilateral shoulders, right hip, right knee and right chest wall. Denies any previous injury to any above listed site. Hx right hip replacement. EXAM: DG HIP (WITH OR WITHOUT PELVIS) 2-3V RIGHT COMPARISON:  01/17/2016 FINDINGS: No fracture.  No dislocation.  No bone lesion. Right total hip arthroplasty appears well seated and aligned and unchanged from the prior exam. Moderate arthropathic changes of the left hip are stable. SI joints and pubic symphysis are normally aligned. Bones are extensively demineralized. There are iliac  and femoral artery vascular calcifications. IMPRESSION: 1. No fracture or dislocation. No evidence of disruption/loosening of the right total hip arthroplasty. Stable appearance from the prior study Electronically Signed   By: Lajean Manes M.D.   On: 05/19/2018 18:40    Procedures Procedures (including critical care time)  Medications Ordered in ED Medications  traMADol (  ULTRAM) tablet 50 mg (50 mg Oral Given 05/19/18 1827)     Initial Impression / Assessment and Plan / ED Course  I have reviewed the triage vital signs and the nursing notes.  Pertinent labs & imaging results that were available during my care of the patient were reviewed by me and considered in my medical decision making (see chart for details).     84 year old female with cardiac disease suffered a mechanical fall on May 13, 2018.  Patient has been ambulatory with a walker but is having continued soreness throughout bilateral shoulders, right knee and the cervical spine.  Family was a little concerned because she needed some assistance getting out of the bed which is new for the patient.  Patient's vital signs are stable.  No chest pain or shortness of breath.  Labs are ordered along with EKG and x-rays of the cervical spine, left and right shoulder, right knee and right hip.  Labs and x-rays pending, care transferred to [facility].  If x-rays, labs and EKG are all within normal limits.  Patient can be discharged to home.  Family members prefer patient to discharge home as she has aides and family members who can assist her. Final Clinical Impressions(s) / ED Diagnoses   Final diagnoses:  Fall, initial encounter  Acute pain of left shoulder  Acute pain of right shoulder  Cervicalgia  Acute pain of right knee    ED Discharge Orders    None       Renata Caprice 05/19/18 Nilda Calamity, [place], MD 05/20/18 [phone]

## 2018-05-23 ENCOUNTER — Ambulatory Visit: Payer: Self-pay | Admitting: Family Medicine

## 2018-06-01 ENCOUNTER — Telehealth: Payer: Self-pay | Admitting: Family Medicine

## 2018-06-01 NOTE — Telephone Encounter (Signed)
Pt's daughter wanting a nurse to let her know if she needs to bring pt to office for her flu shot or if she can take her to [facility] for pt's flu shot?  Please advise.  Thanks, [employer]

## 2018-06-03 ENCOUNTER — Other Ambulatory Visit: Payer: Self-pay | Admitting: Family

## 2018-06-03 DIAGNOSIS — S71109A Unspecified open wound, unspecified thigh, initial encounter: Secondary | ICD-10-CM

## 2018-06-04 ENCOUNTER — Other Ambulatory Visit: Payer: Self-pay | Admitting: Family Medicine

## 2018-06-04 NOTE — Telephone Encounter (Signed)
Advised it is ok to get it either place.

## 2018-06-09 ENCOUNTER — Other Ambulatory Visit: Payer: Self-pay | Admitting: Family Medicine

## 2018-06-13 DIAGNOSIS — H40153 Residual stage of open-angle glaucoma, bilateral: Secondary | ICD-10-CM | POA: Diagnosis not present

## 2018-07-03 ENCOUNTER — Ambulatory Visit: Payer: Medicare Other | Admitting: Infectious Diseases

## 2018-07-05 ENCOUNTER — Ambulatory Visit: Payer: Medicare Other | Attending: Infectious Diseases | Admitting: Infectious Diseases

## 2018-07-05 ENCOUNTER — Other Ambulatory Visit
Admission: RE | Admit: 2018-07-05 | Discharge: 2018-07-05 | Disposition: A | Discharge status: Home / Self Care | Payer: Medicare Other | Source: Ambulatory Visit | Attending: Infectious Diseases | Admitting: Infectious Diseases

## 2018-07-05 VITALS — BP 142/74 | HR 67 | Temp 97.8°F

## 2018-07-05 DIAGNOSIS — H42 Glaucoma in diseases classified elsewhere: Secondary | ICD-10-CM | POA: Diagnosis not present

## 2018-07-05 DIAGNOSIS — I2510 Atherosclerotic heart disease of native coronary artery without angina pectoris: Secondary | ICD-10-CM

## 2018-07-05 DIAGNOSIS — Z794 Long term (current) use of insulin: Secondary | ICD-10-CM

## 2018-07-05 DIAGNOSIS — X58XXXA Exposure to other specified factors, initial encounter: Secondary | ICD-10-CM

## 2018-07-05 DIAGNOSIS — S71109A Unspecified open wound, unspecified thigh, initial encounter: Secondary | ICD-10-CM | POA: Insufficient documentation

## 2018-07-05 DIAGNOSIS — E1139 Type 2 diabetes mellitus with other diabetic ophthalmic complication: Secondary | ICD-10-CM

## 2018-07-05 DIAGNOSIS — Z96641 Presence of right artificial hip joint: Secondary | ICD-10-CM

## 2018-07-05 DIAGNOSIS — S71101A Unspecified open wound, right thigh, initial encounter: Secondary | ICD-10-CM

## 2018-07-05 DIAGNOSIS — Z952 Presence of prosthetic heart valve: Secondary | ICD-10-CM

## 2018-07-05 DIAGNOSIS — Z882 Allergy status to sulfonamides status: Secondary | ICD-10-CM

## 2018-07-05 DIAGNOSIS — H409 Unspecified glaucoma: Secondary | ICD-10-CM | POA: Diagnosis not present

## 2018-07-05 DIAGNOSIS — Z951 Presence of aortocoronary bypass graft: Secondary | ICD-10-CM

## 2018-07-05 DIAGNOSIS — I10 Essential (primary) hypertension: Secondary | ICD-10-CM

## 2018-07-05 NOTE — Progress Notes (Signed)
NAME: Gabriela [name]  DOB: 1926-07-06  MRN: [id number]  Date/Time: 07/05/2018 10:40 AM Subjective:  REASON FOR CONSULT:  ? Gabriela [name] is a 84 y.o.female  with a history of DM, HTN, glaucoma, CABG/mitral valve repairchronic rt thigh wound is here to see me for the same.She was followed by  [facility] clinic ID who is no longer there. PT is here with her daughter and according to them she ahs had this wound for atleast 2 years if not more. She had rt hip replacement many years ago and later had a revision. The site healed well.  She has had this wound and cultures taken from the wound had no bacteria- last time when she saw Dr.Fitzgerald he did an AFB culture and that was negative. She was given Doxy in June but she ahs complete dit now. Since a few days the scab that wa son the wound has fallen and there is a fleshy wound No fever or chills MRI of the thigh done in march 2019 showed Stranding in subcutaneous fat of the anterior right thigh Negative for abscess, osteomyelitis, myositis or septic joint. She does not inject insulin into her rt thigh She has restricted mobility but lives on her own with help of aids visiting her everyday  Past Medical History:  Diagnosis Date  . Arthritis   . Diabetes mellitus ([place])   . Glaucoma   . Hyperlipidemia   . Hypertension   . Shortness of breath    with exertion    Past Surgical History:  Procedure Laterality Date  . CARDIAC CATHETERIZATION  04/22/13  . CHOLECYSTECTOMY    . CORONARY ARTERY BYPASS GRAFT N/A 05/31/2013   Procedure: Coronary Artery Bypass Grafting Times Three Using Left Internal Mammary Artery and Right Saphenous Leg Vein Harvested Endoscopically;  Surgeon: Ivin Poot, MD;  Location: [place];  Service: Open Heart Surgery;  Laterality: N/A;  . EYE SURGERY    . HIP SURGERY     right  . INTRAOPERATIVE TRANSESOPHAGEAL ECHOCARDIOGRAM N/A 05/31/2013   Procedure: INTRAOPERATIVE TRANSESOPHAGEAL ECHOCARDIOGRAM;  Surgeon: Ivin Poot, MD;  Location: [place];  Service: Open Heart Surgery;  Laterality: N/A;  . JOINT REPLACEMENT    . MITRAL VALVE REPAIR N/A 05/31/2013   Procedure: MITRAL VALVE REPAIR (MVR);  Surgeon: Ivin Poot, MD;  Location: [place];  Service: Open Heart Surgery;  Laterality: N/A;  . TONSILLECTOMY      Social History   Socioeconomic History  . Marital status: Widowed    Spouse name: Not on file  . Number of children: 7  . Years of education: Not on file  . Highest education level: Not on file  Occupational History  . Occupation: retired  Scientific laboratory technician  . Financial resource strain: Not on file  . Food insecurity:    Worry: Not on file    Inability: Not on file  . Transportation needs:    Medical: Not on file    Non-medical: Not on file  Tobacco Use  . Smoking status: Never Smoker  . Smokeless tobacco: Never Used  Substance and Sexual Activity  . Alcohol use: No  . Drug use: No  . Sexual activity: Never  Lifestyle  . Physical activity:    Days per week: Not on file    Minutes per session: Not on file  . Stress: Not on file  Relationships  . Social connections:    Talks on phone: Not on file    Gets together: Not on  file    Attends religious service: Not on file    Active member of club or organization: Not on file    Attends meetings of clubs or organizations: Not on file    Relationship status: Not on file  . Intimate partner violence:    Fear of current or ex partner: Not on file    Emotionally abused: Not on file    Physically abused: Not on file    Forced sexual activity: Not on file  Other Topics Concern  . Not on file  Social History Narrative  . Not on file    Family History  Problem Relation Age of Onset  . Diabetes Sister   . Hypertension Mother    Allergies  Allergen Reactions  . Sulfa Antibiotics Rash    ? Current Outpatient Medications  Medication Sig Dispense Refill  . ACCU-CHEK AVIVA PLUS test strip USE TO TEST SUGAR TWICE A DAY 100 each 5  .  aspirin EC 325 MG EC tablet Take 1 tablet (325 mg total) by mouth daily. 30 tablet 0  . Blood Glucose Monitoring Suppl (ACCU-CHEK AVIVA PLUS) w/Device KIT 1 each by Does not apply route daily. Dx E11.9 1 kit 0  . brompheniramine-pseudoephedrine-DM 30-2-10 MG/5ML syrup Take 5 mLs by mouth 3 (three) times daily as needed. 120 mL 0  . clindamycin (CLEOCIN T) 1 % lotion APPLY TO AFFECTED AREA 3 TIMES A DAY 60 mL 1  . doxycycline (VIBRA-TABS) 100 MG tablet Take 1 tablet (100 mg total) by mouth 2 (two) times daily. 60 tablet 0  . furosemide (LASIX) 20 MG tablet Take 1 tablet (20 mg total) by mouth daily. 30 tablet 3  . gabapentin (NEURONTIN) 100 MG capsule TAKE ONE CAPSULE BY MOUTH TWICE A DAY 180 capsule 3  . glucose blood (ACCU-CHEK AVIVA PLUS) test strip Test glucose TWICE DAILY DX: E11.9 100 each 2  . hydrochlorothiazide (HYDRODIURIL) 25 MG tablet TAKE 1 TABLET BY MOUTH EVERY DAY 90 tablet 3  . latanoprost (XALATAN) 0.005 % ophthalmic solution PLACE 1 DROP INTO BOTH EYES EVERY NIGHT AT BEDTIME  4  . LEVEMIR FLEXTOUCH 100 UNIT/ML Pen INJECT 20 UNITS SUBCUTANEOUSLY DAILY 15 mL 5  . meloxicam (MOBIC) 7.5 MG tablet Take 7.5 mg by mouth daily. with food  2  . metoprolol tartrate (LOPRESSOR) 25 MG tablet TAKE 1/2 TABLET BY MOUTH DAILY 45 tablet 3  . pioglitazone (ACTOS) 45 MG tablet TAKE 1 TABLET BY MOUTH EVERY DAY 30 tablet 12  . prednisoLONE acetate (PRED FORTE) 1 % ophthalmic suspension PLACE 1 DROP IN LEFT EYE 4 TIMES A DAY  4  . predniSONE (DELTASONE) 50 MG tablet Take 1 tablet (50 mg total) by mouth daily with breakfast. 5 tablet 0  . SIMBRINZA 1-0.2 % SUSP INSTILL 1 DROP INTO BOTH EYES TWICE A DAY  4  . SURE COMFORT PEN NEEDLES 31G X 5 MM MISC USE AS DIRECTED **PATIENT NEEDS APPOINTMENT FOR FOLLOW UP** 100 each 7  . traMADol (ULTRAM) 50 MG tablet Take 0.5 tablets (25 mg total) by mouth every 6 (six) hours as needed. 10 tablet 0  . travoprost, benzalkonium, (TRAVATAN) 0.004 % ophthalmic solution  Place 1 drop into both eyes at bedtime.     No current facility-administered medications for this visit.      Abtx:  Anti-infectives (From admission, onward)   None      REVIEW OF SYSTEMS:  Const: negative fever, negative chills, negative weight loss Eyes: negative diplopia or visual changes,  negative eye pain ENT: negative coryza, negative sore throat Resp: negative cough, hemoptysis, dyspnea Cards: negative for chest pain, palpitations, lower extremity edema GU: negative for frequency, dysuria and hematuria GI: Negative for abdominal pain, diarrhea, bleeding, constipation Skin: negative for rash and pruritus Heme: negative for easy bruising and gum/nose bleeding MS: negative for myalgias, arthralgias, back pain and muscle weakness Neurolo:negative for headaches, dizziness, vertigo, memory problems  Psych: negative for feelings of anxiety, depression  Endocrine: negative for thyroid, diabetes Allergy/Immunology- negative for any medication or food allergies ? Objective:  VITALS:  BP (!) 142/74 (BP Location: Left Arm, Patient Position: Sitting, Cuff Size: Normal)   Pulse 67   Temp 97.8 F (36.6 C) (Oral)  PHYSICAL EXAM:  General: Alert, cooperative, no distress, appears stated age. In wheel chair, hard of hearing Head: Normocephalic, without obvious abnormality, atraumatic. Eyes: Conjunctivae clear, anicteric sclerae. Pupils are equal ENT Nares normal. No drainage or sinus tenderness. Lips, mucosa, and tongue normal. No Thrush Neck: Supple, symmetrical, no adenopathy, thyroid: non tender no carotid bruit and no JVD. Back: No CVA tenderness. Lungs: Clear to auscultation bilaterally. No Wheezing or Rhonchi. No rales. Heart: Regular rate and rhythm, no murmur, rub or gallop. Abdomen: Soft, non-tender,not distended. Bowel sounds normal. No masses Extremities: rt thigh wound-   Some granulation tissue- does not probe, no erythema. Surrounding dimpling of soft tissue Skin:  No rashes or lesions. Or bruising Lymph: Cervical, supraclavicular normal. Neurologic: Grossly non-focal Pertinent Labs Lab Results CBC    Component Value Date/Time   WBC 7.2 05/19/2018 1849   RBC 3.15 (L) 05/19/2018 1849   HGB 9.1 (L) 05/19/2018 1849   HGB 8.5 (L) 05/09/2018 1143   HCT 27.4 (L) 05/19/2018 1849   HCT 26.9 (L) 05/09/2018 1143   PLT 153 05/19/2018 1849   PLT 129 (L) 05/09/2018 1143   MCV 86.8 05/19/2018 1849   MCV 85 05/09/2018 1143   MCH 28.7 05/19/2018 1849   MCHC 33.1 05/19/2018 1849   RDW 15.8 (H) 05/19/2018 1849   RDW 14.3 05/09/2018 1143   LYMPHSABS 1.6 05/19/2018 1849   LYMPHSABS 1.8 05/09/2018 1143   MONOABS 1.3 (H) 05/19/2018 1849   EOSABS 0.0 05/19/2018 1849   EOSABS 0.0 05/09/2018 1143   BASOSABS 0.0 05/19/2018 1849   BASOSABS 0.0 05/09/2018 1143    CMP Latest Ref Rng & Units 05/19/2018 05/09/2018 03/27/2018  Glucose 70 - 99 mg/dL 213(H) 93 102(H)  BUN 8 - 23 mg/dL 47(H) 50(H) 46(H)  Creatinine 0.44 - 1.00 mg/dL 1.74(H) 1.72(H) 1.42(H)  Sodium 135 - 145 mmol/L 140 138 142  Potassium 3.5 - 5.1 mmol/L 4.1 4.2 4.7  Chloride 98 - 111 mmol/L 108 108(H) 108(H)  CO2 22 - 32 mmol/L 23 16(L) 19(L)  Calcium 8.9 - 10.3 mg/dL 8.6(L) 8.3(L) 9.3  Total Protein 6.0 - 8.5 g/dL - 6.9 7.8  Total Bilirubin 0.0 - 1.2 mg/dL - 0.4 0.4  Alkaline Phos 39 - 117 IU/L - 96 102  AST 0 - 40 IU/L - 18 13  ALT 0 - 32 IU/L - 10 7      Microbiology: No results found for this or any previous visit (from the past 240 hour(s)). IMAGING RESULTS: March 2019 MRI reviewed personally ? Impression/Recommendation ?84 y.o.female  with a history of DM, HTN, glaucoma, CABG/mitral valve repairchronic rt thigh wound is here to see me for the same.She was followed by  [facility] clinic ID who is no longer there.  ?  Chronic rt thigh wound- with  surrounding fatty tissue atrophy. unclear etiology- MRI thigh done in March 2019 does not show underlying osteomyelitis. never had a positive  wound culture- this time around does not look like infection. There is granulation tissue, does not probe . Would only do local wound care. No need for antibiotics. Sent a culture today. If it gets worse may need punch biopsy to understand the pathology ? DM- on insulin  HTn  CAD- S/p CABG   Discussed with patient and her daughter Follow as needed

## 2018-07-05 NOTE — Patient Instructions (Addendum)
You are here for a small wound on the rt thih which has bene present on and off for 2 years- in June you were seen by Dr.Fittzgerald and he gave you doxycycine ( all cultures have been negative and AFB neg as well) The wound scabbed but now the scab has fallen off and there is a small fleshy clean area I did culture today- recommend stop antibiotics and observe. Cover it with dressing like you are doing now We may do a punch biopsy of the wound if needed . Please take serial pictures of the wound. Will see you as needed

## 2018-07-08 LAB — AEROBIC CULTURE W GRAM STAIN (SUPERFICIAL SPECIMEN): Culture: NO GROWTH

## 2018-07-08 LAB — AEROBIC CULTURE  (SUPERFICIAL SPECIMEN): GRAM STAIN: NONE SEEN

## 2018-07-15 ENCOUNTER — Other Ambulatory Visit: Payer: Self-pay | Admitting: Family Medicine

## 2018-07-16 ENCOUNTER — Other Ambulatory Visit: Payer: Self-pay | Admitting: Family Medicine

## 2018-07-16 DIAGNOSIS — I10 Essential (primary) hypertension: Secondary | ICD-10-CM

## 2018-07-24 ENCOUNTER — Other Ambulatory Visit: Payer: Self-pay | Admitting: Family

## 2018-07-24 DIAGNOSIS — S71109A Unspecified open wound, unspecified thigh, initial encounter: Secondary | ICD-10-CM

## 2018-07-26 ENCOUNTER — Other Ambulatory Visit: Payer: Self-pay | Admitting: Family Medicine

## 2018-07-26 DIAGNOSIS — H6123 Impacted cerumen, bilateral: Secondary | ICD-10-CM | POA: Diagnosis not present

## 2018-07-26 DIAGNOSIS — H903 Sensorineural hearing loss, bilateral: Secondary | ICD-10-CM | POA: Diagnosis not present

## 2018-07-26 NOTE — Telephone Encounter (Signed)
[place], faxed refill request for the following medications:  furosemide (LASIX) 20 MG tablet  90 day supply  Last Rx: 05/17/18 was 30 day supply with 3 refills Please advise. Thanks TNP

## 2018-07-27 ENCOUNTER — Other Ambulatory Visit: Payer: Self-pay | Admitting: Family Medicine

## 2018-07-30 MED ORDER — FUROSEMIDE 20 MG PO TABS: 20.0000 mg | ORAL_TABLET | Freq: Every day | ORAL | 0 refills | Status: DC

## 2018-07-31 ENCOUNTER — Ambulatory Visit: Payer: Self-pay | Admitting: Family Medicine

## 2018-08-16 ENCOUNTER — Other Ambulatory Visit: Payer: Self-pay | Admitting: Family Medicine

## 2018-08-23 ENCOUNTER — Encounter: Payer: Self-pay | Admitting: Family Medicine

## 2018-08-23 ENCOUNTER — Ambulatory Visit (INDEPENDENT_AMBULATORY_CARE_PROVIDER_SITE_OTHER): Payer: Medicare Other | Admitting: Family Medicine

## 2018-08-23 ENCOUNTER — Other Ambulatory Visit: Payer: Self-pay

## 2018-08-23 VITALS — BP 142/70 | HR 72 | Temp 98.1°F | Resp 16 | Wt 134.0 lb

## 2018-08-23 DIAGNOSIS — Z794 Long term (current) use of insulin: Secondary | ICD-10-CM | POA: Diagnosis not present

## 2018-08-23 DIAGNOSIS — E785 Hyperlipidemia, unspecified: Secondary | ICD-10-CM | POA: Diagnosis not present

## 2018-08-23 DIAGNOSIS — I2510 Atherosclerotic heart disease of native coronary artery without angina pectoris: Secondary | ICD-10-CM

## 2018-08-23 DIAGNOSIS — E118 Type 2 diabetes mellitus with unspecified complications: Secondary | ICD-10-CM | POA: Diagnosis not present

## 2018-08-23 DIAGNOSIS — I10 Essential (primary) hypertension: Secondary | ICD-10-CM

## 2018-08-23 DIAGNOSIS — E1121 Type 2 diabetes mellitus with diabetic nephropathy: Secondary | ICD-10-CM

## 2018-08-23 DIAGNOSIS — E1159 Type 2 diabetes mellitus with other circulatory complications: Secondary | ICD-10-CM

## 2018-08-23 DIAGNOSIS — M1610 Unilateral primary osteoarthritis, unspecified hip: Secondary | ICD-10-CM

## 2018-08-23 LAB — POCT GLYCOSYLATED HEMOGLOBIN (HGB A1C): HEMOGLOBIN A1C: 8.2 % — AB (ref 4.0–5.6)

## 2018-08-23 NOTE — Progress Notes (Signed)
[name] Female    DOB: 1926/03/21   85 y.o.   MRN: [id number] Visit Date: 08/23/2018  Today's Provider: Wilhemena Durie, MD   Chief Complaint  Patient presents with  . Diabetes  . Hypertension  . Hyperlipidemia   Subjective:     HPI   Diabetes Mellitus Type II, Follow-up:  Last seen for diabetes 4 months ago. Management since then includes no changes. She reports good compliance with treatment.She is not having side effects. Current symptoms include none and have been stable.Home blood sugar records: fasting range: 180s. Patient brought in a log book to review. She reports that for the last few weeks, she has noticed her blood sugar levels increasing. She is currently taking 15units of insulin daily.   Lab Results  Component Value Date   HGBA1C 8.2 (A) 08/23/2018   HGBA1C 5.9 (H) 03/27/2018   HGBA1C 6.2 (H) 10/10/2017   Pertinent Labs:    Component Value Date/Time   CHOL 111 05/09/2016 1502   TRIG 96 05/09/2016 1502   HDL 40 05/09/2016 1502   LDLCALC 52 05/09/2016 1502   CREATININE 1.74 (H) 05/19/2018 1849   CREATININE 1.33 (H) 06/13/2017 1156    Wt Readings from Last 3 Encounters:  08/23/18 134 lb (60.8 kg)  05/19/18 145 lb (65.8 kg)  05/09/18 145 lb 6.4 oz (66 kg)    Hypertension, follow-up: She was last seen for hypertension 4 months ago. BP at that visit was 142/74. Management since that visit includes no changes. She reports good compliance with treatment. She is not having side effects. She is not exercising. She is adherent to low salt diet. Outside blood pressures are checked daily.  Patient denies exertional chest pressure/discomfort, lower extremity edema and palpitations.    BP Readings from Last 3 Encounters:  08/23/18 (!) 142/70  07/05/18 (!) 142/74  05/19/18 (!) 179/64      Allergies  Allergen Reactions  . Sulfa Antibiotics Rash     Current Outpatient Medications:  .  ACCU-CHEK AVIVA PLUS test strip, USE TO TEST  SUGAR TWICE A DAY, Disp: 100 each, Rfl: 5 .  aspirin EC 325 MG EC tablet, Take 1 tablet (325 mg total) by mouth daily., Disp: 30 tablet, Rfl: 0 .  Blood Glucose Monitoring Suppl (ACCU-CHEK AVIVA PLUS) w/Device KIT, 1 each by Does not apply route daily. Dx E11.9, Disp: 1 kit, Rfl: 0 .  brompheniramine-pseudoephedrine-DM 30-2-10 MG/5ML syrup, Take 5 mLs by mouth 3 (three) times daily as needed., Disp: 120 mL, Rfl: 0 .  clindamycin (CLEOCIN T) 1 % lotion, APPLY TO AFFECTED AREA 3 TIMES A DAY, Disp: 60 mL, Rfl: 1 .  furosemide (LASIX) 20 MG tablet, Take 1 tablet (20 mg total) by mouth daily., Disp: 90 tablet, Rfl: 0 .  gabapentin (NEURONTIN) 100 MG capsule, TAKE ONE CAPSULE BY MOUTH TWICE A DAY, Disp: 180 capsule, Rfl: 3 .  glucose blood (ACCU-CHEK AVIVA PLUS) test strip, Test glucose TWICE DAILY DX: E11.9, Disp: 100 each, Rfl: 2 .  hydrochlorothiazide (HYDRODIURIL) 25 MG tablet, TAKE 1 TABLET BY MOUTH EVERY DAY, Disp: 90 tablet, Rfl: 3 .  Insulin Pen Needle (SURE COMFORT PEN NEEDLES) 31G X 5 MM MISC, USE AS DIRECTED **PATIENT NEEDS APPOINTMENT FOR FOLLOW UP**, Disp: 100 each, Rfl: 0 .  latanoprost (XALATAN) 0.005 % ophthalmic solution, PLACE 1 DROP INTO BOTH EYES EVERY NIGHT AT BEDTIME, Disp: , Rfl: 4 .  LEVEMIR FLEXTOUCH 100 UNIT/ML Pen, INJECT 20 UNITS  SUBCUTANEOUSLY DAILY, Disp: 15 mL, Rfl: 5 .  metoprolol tartrate (LOPRESSOR) 25 MG tablet, TAKE 1/2 TABLET BY MOUTH DAILY, Disp: 45 tablet, Rfl: 3 .  pioglitazone (ACTOS) 45 MG tablet, TAKE 1 TABLET BY MOUTH EVERY DAY, Disp: 90 tablet, Rfl: 4 .  prednisoLONE acetate (PRED FORTE) 1 % ophthalmic suspension, PLACE 1 DROP IN LEFT EYE 4 TIMES A DAY, Disp: , Rfl: 4 .  predniSONE (DELTASONE) 50 MG tablet, Take 1 tablet (50 mg total) by mouth daily with breakfast., Disp: 5 tablet, Rfl: 0 .  SIMBRINZA 1-0.2 % SUSP, INSTILL 1 DROP INTO BOTH EYES TWICE A DAY, Disp: , Rfl: 4 .  traMADol (ULTRAM) 50 MG tablet, Take 0.5 tablets (25 mg total) by mouth every 6 (six)  hours as needed., Disp: 10 tablet, Rfl: 0 .  travoprost, benzalkonium, (TRAVATAN) 0.004 % ophthalmic solution, Place 1 drop into both eyes at bedtime., Disp: , Rfl:  .  doxycycline (VIBRA-TABS) 100 MG tablet, Take 1 tablet (100 mg total) by mouth 2 (two) times daily. (Patient not taking: Reported on 08/23/2018), Disp: 60 tablet, Rfl: 0 .  meloxicam (MOBIC) 7.5 MG tablet, Take 7.5 mg by mouth daily. with food, Disp: , Rfl: 2  Review of Systems  Constitutional: Negative for activity change, appetite change, chills, fatigue, fever and unexpected weight change.  HENT: Negative.   Eyes: Negative.   Respiratory: Negative for cough and shortness of breath.   Cardiovascular: Negative for chest pain, palpitations and leg swelling.  Endocrine: Negative for cold intolerance, heat intolerance, polydipsia, polyphagia and polyuria.  Musculoskeletal: Positive for arthralgias and myalgias.  Allergic/Immunologic: Negative.   Neurological: Negative for dizziness, numbness and headaches.  Psychiatric/Behavioral: Negative.     Social History   Tobacco Use  . Smoking status: Never Smoker  . Smokeless tobacco: Never Used  Substance Use Topics  . Alcohol use: No      Objective:   BP (!) 142/70 (BP Location: Left Arm, Patient Position: Sitting, Cuff Size: Normal)   Pulse 72   Temp 98.1 F (36.7 C)   Resp 16   Wt 134 lb (60.8 kg)   SpO2 100%   BMI 23.00 kg/m  Vitals:   08/23/18 1438  BP: (!) 142/70  Pulse: 72  Resp: 16  Temp: 98.1 F (36.7 C)  SpO2: 100%  Weight: 134 lb (60.8 kg)     Physical Exam Constitutional:      Appearance: Normal appearance. She is well-developed.  HENT:     Head: Normocephalic and atraumatic.     Right Ear: External ear normal.     Left Ear: External ear normal.     Nose: Nose normal.  Eyes:     General: No scleral icterus.    Conjunctiva/sclera: Conjunctivae normal.  Neck:     Thyroid: No thyromegaly.  Cardiovascular:     Rate and Rhythm: Normal rate and  regular rhythm.     Heart sounds: Normal heart sounds.  Pulmonary:     Effort: Pulmonary effort is normal.     Breath sounds: Normal breath sounds.  Abdominal:     Palpations: Abdomen is soft.  Skin:    General: Skin is warm and dry.  Neurological:     General: No focal deficit present.     Mental Status: She is alert and oriented to person, place, and time. Mental status is at baseline.  Psychiatric:        Mood and Affect: Mood normal.        Behavior:  Behavior normal.        Thought Content: Thought content normal.        Judgment: Judgment normal.         Assessment & Plan    1. Type 2 diabetes mellitus with complication, with long-term current use of insulin ([place]) Fair control at 8.2 in 85 yo. - POCT glycosylated hemoglobin (Hb A1C)  2. Hyperlipidemia, unspecified hyperlipidemia type   3. Atherosclerosis of native coronary artery of native heart without angina pectoris   4. Essential hypertension Controlled.  5. Diabetic nephropathy associated with type 2 diabetes mellitus ([place]) Labs on next visit.  6. Type 2 diabetes mellitus with other circulatory complication, unspecified whether long term insulin use ([place])   7. Arthritis, hip Increases fall risk.    I have done the exam and reviewed the above chart and it is accurate to the best of my knowledge. Development worker, community has been used in this note in any air is in the dictation or transcription are unintentional.  Wilhemena Durie, MD  [place]

## 2018-08-23 NOTE — Telephone Encounter (Signed)
The pharmacy states pt's Accu-chek Aviva will be discontinued and needs to be changed to [place].  Please send to [facility].  Please call when complete [phone]   Thanks,   -Mickel Baas

## 2018-08-24 MED ORDER — GLUCOSE BLOOD VI STRP: ORAL_STRIP | 12 refills | Status: DC

## 2018-08-24 MED ORDER — ACCU-CHEK FASTCLIX LANCETS MISC: 12 refills | Status: DC

## 2018-08-24 MED ORDER — ACCU-CHEK GUIDE W/DEVICE KIT: 1.0000 | PACK | Freq: Every day | 0 refills | Status: DC

## 2018-08-30 DIAGNOSIS — H1132 Conjunctival hemorrhage, left eye: Secondary | ICD-10-CM | POA: Diagnosis not present

## 2018-08-30 DIAGNOSIS — H40153 Residual stage of open-angle glaucoma, bilateral: Secondary | ICD-10-CM | POA: Diagnosis not present

## 2018-09-24 ENCOUNTER — Other Ambulatory Visit: Payer: Self-pay | Admitting: Family Medicine

## 2018-09-24 NOTE — Telephone Encounter (Signed)
Pharmacy requesting a Rx for a meter to be sent in. Thanks!

## 2018-10-16 ENCOUNTER — Ambulatory Visit: Payer: Medicare Other | Admitting: Infectious Diseases

## 2018-10-24 ENCOUNTER — Other Ambulatory Visit: Payer: Self-pay | Admitting: Family Medicine

## 2018-10-24 ENCOUNTER — Other Ambulatory Visit: Payer: Self-pay | Admitting: *Deleted

## 2018-10-24 MED ORDER — INSULIN PEN NEEDLE 31G X 5 MM MISC: 0 refills | Status: DC

## 2018-10-24 NOTE — Telephone Encounter (Signed)
Patient called office stating she needs a refill for insulin needles? Sent to [facility]. Please advise?

## 2018-10-24 NOTE — Telephone Encounter (Signed)
Pharmacy requesting refills. Thanks!  

## 2018-12-24 ENCOUNTER — Ambulatory Visit: Payer: Self-pay | Admitting: Family Medicine

## 2019-01-21 ENCOUNTER — Other Ambulatory Visit: Payer: Self-pay | Admitting: Family Medicine

## 2019-01-21 NOTE — Telephone Encounter (Signed)
Please review

## 2019-01-30 ENCOUNTER — Telehealth: Payer: Self-pay | Admitting: Family Medicine

## 2019-01-30 NOTE — Chronic Care Management (AMB) (Signed)
°  Chronic Care Management   Outreach Note  01/30/2019 Name: Gabriela [name] MRN: [id number] DOB: Jul 10, 1926  Referred by: Jerrol Banana., MD Reason for referral : Chronic Care Management (Initial CCM outreach was unsuccessful)   An unsuccessful telephone outreach was attempted today. The patient was referred to the case management team by for assistance with chronic care management and care coordination.   Follow Up Plan: The care management team will reach out to the patient again over the next 7 days.   [place]  [email or web address]   ??[phone]

## 2019-02-11 NOTE — Chronic Care Management (AMB) (Signed)
°  Chronic Care Management   Outreach Note  02/11/2019 Name: Gabriela [name] MRN: [id number] DOB: 22-Jun-1926  Referred by: Jerrol Banana., MD Reason for referral : Chronic Care Management (Initial CCM outreach was unsuccessful) and Chronic Care Management (Second CCM outreach was unsuccessful.)   A second unsuccessful telephone outreach was attempted today. The patient was referred to the case management team for assistance with chronic care management and care coordination.   Follow Up Plan: A HIPPA compliant phone message was left for the patient providing contact information and requesting a return call.  The care management team will reach out to the patient again over the next 7 days.  If patient returns call to provider office, please advise to call [place] at [place]  [email or web address]   ??[phone]

## 2019-02-22 NOTE — Chronic Care Management (AMB) (Signed)
Chronic Care Management   Note  02/22/2019 Name: Gabriela [name] MRN: [id number] DOB: July 15, 1926  Gabriela [name] is a 85 y.o. year old female who is a primary care patient of Jerrol Banana., MD. I reached out to [place] by phone today in response to a referral sent by Gabriela [name]'s health plan.    Gabriela [name] was given information about Chronic Care Management services today including:  1. CCM service includes personalized support from designated clinical staff supervised by her physician, including individualized plan of care and coordination with other care providers 2. 24/7 contact phone numbers for assistance for urgent and routine care needs. 3. Service will only be billed when office clinical staff spend 20 minutes or more in a month to coordinate care. 4. Only one practitioner may furnish and bill the service in a calendar month. 5. The patient may stop CCM services at any time (effective at the end of the month) by phone call to the office staff. 6. The patient will be responsible for cost sharing (co-pay) of up to 20% of the service fee (after annual deductible is met).  Patient agreed to services and verbal consent obtained.   Follow up plan: Telephone appointment with CCM team member scheduled for: 03/04/2019  [place]  [email or web address]   ??[phone]

## 2019-02-27 ENCOUNTER — Other Ambulatory Visit: Payer: Self-pay | Admitting: Family Medicine

## 2019-03-04 ENCOUNTER — Telehealth: Payer: Medicare Other

## 2019-03-05 ENCOUNTER — Ambulatory Visit: Payer: Self-pay | Admitting: Pharmacist

## 2019-03-05 DIAGNOSIS — E785 Hyperlipidemia, unspecified: Secondary | ICD-10-CM

## 2019-03-05 DIAGNOSIS — Z794 Long term (current) use of insulin: Secondary | ICD-10-CM

## 2019-03-05 DIAGNOSIS — E118 Type 2 diabetes mellitus with unspecified complications: Secondary | ICD-10-CM

## 2019-03-05 NOTE — Chronic Care Management (AMB) (Signed)
Chronic Care Management   Note  03/05/2019 Name: Gabriela [name] MRN: [id number] DOB: 1926/01/06  Subjective:   Gabriela [name] is a 85 year old female patient of Dr. Miguel Aschoff. Gabriela [name] was referred to [facility] clinical pharmacy services by health plan. Successful telephone outreach today for initial pharmacy consult. HIPAA identifiers verified.  Gabriela. [name] reports no side effects from medications. Able to afford all medications at this time. Utilizes [employer] OTC catalog benefit. Reports FBG in morning ranging from 110-154 mg/dL.   Objective: Lab Results  Component Value Date   CREATININE 1.74 (H) 05/19/2018   CREATININE 1.72 (H) 05/09/2018   CREATININE 1.42 (H) 03/27/2018    Lab Results  Component Value Date   HGBA1C 8.2 (A) 08/23/2018    Lipid Panel     Component Value Date/Time   CHOL 111 05/09/2016 1502   TRIG 96 05/09/2016 1502   HDL 40 05/09/2016 1502   LDLCALC 52 05/09/2016 1502    BP Readings from Last 3 Encounters:  08/23/18 (!) 142/70  07/05/18 (!) 142/74  05/19/18 (!) 179/64    Allergies  Allergen Reactions  . Sulfa Antibiotics Rash    Medications Reviewed Today    Reviewed by Cathi Roan, [facility] (Pharmacist) on 03/05/19 at 1133  Med List Status: <None>  Medication Order Taking? Sig Documenting Provider Last Dose Status Informant  ACCU-CHEK AVIVA PLUS test strip [id number] Yes USE TO TEST SUGAR TWICE A DAY Jerrol Banana., MD Taking Active   [place] [id number] Yes To check blood sugars once a day. DX E11.9 Jerrol Banana., MD Taking Active   aspirin EC 81 MG tablet [id number] Yes Take 81 mg by mouth daily. [provider] Taking Active   Blood Glucose Monitoring Suppl (ACCU-CHEK AVIVA PLUS) w/Device KIT [id number] Yes 1 each by Does not apply route daily. Dx E11.9 Jerrol Banana., MD Taking Active   furosemide (LASIX) 20 MG tablet [id number] Yes TAKE 1 TABLET BY MOUTH EVERY DAY Jerrol Banana.,  MD Taking Active   hydrochlorothiazide (HYDRODIURIL) 25 MG tablet [id number] Yes TAKE 1 TABLET BY MOUTH EVERY DAY Jerrol Banana., MD Taking Active   Insulin Pen Needle (SURE COMFORT PEN NEEDLES) 31G X 5 MM MISC [id number] Yes USE WITH INSULIN DOSES Jerrol Banana., MD Taking Active   LEVEMIR FLEXTOUCH 100 UNIT/ML Pen [id number] Yes INJECT 20 UNITS SUBCUTANEOUSLY DAILY  Patient taking differently: Inject 15 Units into the skin daily. In the afternoon   Birdie Sons, MD Taking Active   metoprolol tartrate (LOPRESSOR) 25 MG tablet [id number] Yes TAKE 1/2 TABLET BY MOUTH DAILY Jerrol Banana., MD Taking Active   timolol (TIMOPTIC) 0.5 % ophthalmic solution [id number] Yes Place 1 drop into both eyes every morning. [provider] Taking Active         Discontinued 03/05/19 1133 (Change in therapy)            Assessment:    Goals Addressed            This Visit's Progress   . Medication optimization (pt-stated)       Current Barriers:  . Low Vision and Diminished manual dexterity  Pharmacist Clinical Goal(s):  Marland Kitchen Over the next 60 days, patient will demonstrate improved understanding of prescribed medications and rationale for usage as evidenced by patient report  Interventions: . Comprehensive medication review performed. Marland Kitchen Updated medication list in EMR . Provided personalized medication guide . Referred  to provider follow up visit, patient missed follow up in May and has not rescheduled  Patient Self Care Activities:  . Self administers medications as prescribed . Attends all scheduled provider appointments . Calls pharmacy for medication refills  Initial goal documentation        Plan:   Recommendations discussed with patient - Missed follow up with Dr. Rosanna Randy in May; please collaborate with your daughters to find a time when you can come in. I will have front desk contact you.   Follow up: Telephone follow up appointment with care  management team member scheduled for: 30 days to assess progression of goals  Ruben Reason, PharmD Clinical Pharmacist [place] [phone]

## 2019-03-05 NOTE — Patient Instructions (Signed)
Goals Addressed            This Visit's Progress   . Medication optimization (pt-stated)       Current Barriers:  . Low Vision and Diminished manual dexterity  Pharmacist Clinical Goal(s):  Marland Kitchen Over the next 60 days, patient will demonstrate improved understanding of prescribed medications and rationale for usage as evidenced by patient report  Interventions: . Comprehensive medication review performed. Marland Kitchen Updated medication list in EMR . Provided personalized medication guide . Referred to provider follow up visit, patient missed follow up in May and has not rescheduled  Patient Self Care Activities:  . Self administers medications as prescribed . Attends all scheduled provider appointments . Calls pharmacy for medication refills  Initial goal documentation

## 2019-04-15 ENCOUNTER — Other Ambulatory Visit: Payer: Self-pay | Admitting: Family Medicine

## 2019-04-17 ENCOUNTER — Other Ambulatory Visit: Payer: Self-pay | Admitting: Family Medicine

## 2019-06-08 ENCOUNTER — Other Ambulatory Visit: Payer: Self-pay | Admitting: Family Medicine

## 2019-06-26 ENCOUNTER — Ambulatory Visit: Payer: Medicare Other | Admitting: Family Medicine

## 2019-07-24 ENCOUNTER — Other Ambulatory Visit: Payer: Self-pay | Admitting: Family Medicine

## 2019-07-24 DIAGNOSIS — I10 Essential (primary) hypertension: Secondary | ICD-10-CM

## 2019-07-24 NOTE — Telephone Encounter (Signed)
Requested medication (s) are due for refill today: yes   Requested medication (s) are on the active medication list:yes  Last refill:  04/07/2019  Future visit scheduled: no  Notes to clinic:  No valid encounter in last 6 months    Requested Prescriptions  Pending Prescriptions Disp Refills   metoprolol tartrate (LOPRESSOR) 25 MG tablet [Pharmacy Med Name: METOPROLOL TARTRATE 25 MG TAB] 45 tablet 3    Sig: TAKE 1/2 TABLET BY MOUTH DAILY     Cardiovascular:  Beta Blockers Failed - 07/24/2019 10:40 AM      Failed - Last BP in normal range    BP Readings from Last 1 Encounters:  08/23/18 (!) 142/70         Failed - Valid encounter within last 6 months    Recent Outpatient Visits          11 months ago Type 2 diabetes mellitus with complication, with long-term current use of insulin [facility])   [facility] Jerrol Banana., MD   1 year ago Type 2 diabetes mellitus with complication, with long-term current use of insulin [facility])   [facility] Jerrol Banana., MD   1 year ago Type 2 diabetes mellitus without complication, with long-term current use of insulin [facility])   [facility] Jerrol Banana., MD   2 years ago Type 2 diabetes mellitus without complication, with long-term current use of insulin [facility])   [facility] Jerrol Banana., MD   2 years ago Simple chronic bronchitis [facility])   [facility] Jerrol Banana., MD             Passed - Last Heart Rate in normal range    Pulse Readings from Last 1 Encounters:  08/23/18 72

## 2019-08-01 ENCOUNTER — Other Ambulatory Visit: Payer: Self-pay | Admitting: Family Medicine

## 2019-08-01 NOTE — Telephone Encounter (Signed)
Requested medication (s) are due for refill today: yes  Requested medication (s) are on the active medication list: yes  Last refill:  04/29/2019  Future visit scheduled: no  Notes to clinic: LOV-08/23/2018 Overdue for office visit    Requested Prescriptions  Pending Prescriptions Disp Refills   furosemide (LASIX) 20 MG tablet [Pharmacy Med Name: FUROSEMIDE 20 MG TABLET] 90 tablet 0    Sig: TAKE 1 TABLET BY MOUTH EVERY DAY      Cardiovascular:  Diuretics - Loop Failed - 08/01/2019  3:51 AM      Failed - K in normal range and within 360 days    Potassium  Date Value Ref Range Status  05/19/2018 4.1 3.5 - 5.1 mmol/L Final          Failed - Ca in normal range and within 360 days    Calcium  Date Value Ref Range Status  05/19/2018 8.6 (L) 8.9 - 10.3 mg/dL Final   Calcium, Ion  Date Value Ref Range Status  06/01/2013 1.28 1.13 - 1.30 mmol/L Final          Failed - Na in normal range and within 360 days    Sodium  Date Value Ref Range Status  05/19/2018 140 135 - 145 mmol/L Final  05/09/2018 138 134 - 144 mmol/L Final          Failed - Cr in normal range and within 360 days    Creat  Date Value Ref Range Status  06/13/2017 1.33 (H) 0.60 - 0.88 mg/dL Final    Comment:    For patients >58 years of age, the reference limit for Creatinine is approximately 13% higher for people identified as African-American. .    Creatinine, Ser  Date Value Ref Range Status  05/19/2018 1.74 (H) 0.44 - 1.00 mg/dL Final          Failed - Last BP in normal range    BP Readings from Last 1 Encounters:  08/23/18 (!) 142/70          Failed - Valid encounter within last 6 months    Recent Outpatient Visits           11 months ago Type 2 diabetes mellitus with complication, with long-term current use of insulin [facility])   [facility] Jerrol Banana., MD   1 year ago Type 2 diabetes mellitus with complication, with long-term current use of insulin [facility])   [facility] Jerrol Banana., MD   1 year ago Type 2 diabetes mellitus without complication, with long-term current use of insulin [facility])   [facility] Jerrol Banana., MD   2 years ago Type 2 diabetes mellitus without complication, with long-term current use of insulin [facility])   [facility] Jerrol Banana., MD   2 years ago Simple chronic bronchitis [facility])   [facility] Jerrol Banana., MD

## 2019-08-02 ENCOUNTER — Other Ambulatory Visit: Payer: Self-pay | Admitting: Family Medicine

## 2019-08-06 DIAGNOSIS — H6123 Impacted cerumen, bilateral: Secondary | ICD-10-CM | POA: Diagnosis not present

## 2019-08-06 DIAGNOSIS — H903 Sensorineural hearing loss, bilateral: Secondary | ICD-10-CM | POA: Diagnosis not present

## 2019-08-16 ENCOUNTER — Other Ambulatory Visit: Payer: Self-pay | Admitting: Family Medicine

## 2019-09-05 ENCOUNTER — Encounter: Payer: Self-pay | Admitting: Family Medicine

## 2019-09-05 ENCOUNTER — Other Ambulatory Visit: Payer: Self-pay | Admitting: Family Medicine

## 2019-09-05 NOTE — Telephone Encounter (Signed)
This encounter was created in error - please disregard.

## 2019-09-05 NOTE — Telephone Encounter (Signed)
Requested medication (s) are due for refill today: yes  Requested medication (s) are on the active medication list: yes  Last refill: 08/01/2019  Future visit scheduled:No  Notes to clinic:  Patient needs OV for refills      Requested Prescriptions  Pending Prescriptions Disp Refills   furosemide (LASIX) 20 MG tablet [Pharmacy Med Name: FUROSEMIDE 20 MG TABLET] 90 tablet 0    Sig: TAKE 1 TABLET BY MOUTH EVERY DAY      Cardiovascular:  Diuretics - Loop Failed - 09/05/2019 10:12 AM      Failed - K in normal range and within 360 days    Potassium  Date Value Ref Range Status  05/19/2018 4.1 3.5 - 5.1 mmol/L Final          Failed - Ca in normal range and within 360 days    Calcium  Date Value Ref Range Status  05/19/2018 8.6 (L) 8.9 - 10.3 mg/dL Final   Calcium, Ion  Date Value Ref Range Status  06/01/2013 1.28 1.13 - 1.30 mmol/L Final          Failed - Na in normal range and within 360 days    Sodium  Date Value Ref Range Status  05/19/2018 140 135 - 145 mmol/L Final  05/09/2018 138 134 - 144 mmol/L Final          Failed - Cr in normal range and within 360 days    Creat  Date Value Ref Range Status  06/13/2017 1.33 (H) 0.60 - 0.88 mg/dL Final    Comment:    For patients >83 years of age, the reference limit for Creatinine is approximately 13% higher for people identified as African-American. .    Creatinine, Ser  Date Value Ref Range Status  05/19/2018 1.74 (H) 0.44 - 1.00 mg/dL Final          Failed - Last BP in normal range    BP Readings from Last 1 Encounters:  08/23/18 (!) 142/70          Failed - Valid encounter within last 6 months    Recent Outpatient Visits           1 year ago Type 2 diabetes mellitus with complication, with long-term current use of insulin [facility])   [facility] Jerrol Banana., MD   1 year ago Type 2 diabetes mellitus with complication, with long-term current use of insulin [facility])   [facility] Jerrol Banana., MD   1 year ago Type 2 diabetes mellitus without complication, with long-term current use of insulin [facility])   [facility] Jerrol Banana., MD   2 years ago Type 2 diabetes mellitus without complication, with long-term current use of insulin [facility])   [facility] Jerrol Banana., MD   2 years ago Simple chronic bronchitis [facility])   [facility] Jerrol Banana., MD

## 2019-09-05 NOTE — Telephone Encounter (Signed)
Requested medication (s) are due for refill today: yes  Requested medication (s) are on the active medication list: yes  Last refill:  06/10/18  Future visit scheduled:No  Notes to clinic:Expired RX Needs OV    Requested Prescriptions  Pending Prescriptions Disp Refills   LEVEMIR FLEXTOUCH 100 UNIT/ML Pen [Pharmacy Med Name: LEVEMIR FLEXTOUCH 100 UNIT/ML] 15 mL 5    Sig: INJECT [place]      Endocrinology:  Diabetes - Insulins Failed - 09/05/2019 10:12 AM      Failed - HBA1C is between 0 and 7.9 and within 180 days    Hemoglobin A1C  Date Value Ref Range Status  08/23/2018 8.2 (A) 4.0 - 5.6 % Final   Hgb A1c MFr Bld  Date Value Ref Range Status  03/27/2018 5.9 (H) 4.8 - 5.6 % Final    Comment:             Prediabetes: 5.7 - 6.4          Diabetes: >6.4          Glycemic control for adults with diabetes: <7.0           Failed - Valid encounter within last 6 months    Recent Outpatient Visits           1 year ago Type 2 diabetes mellitus with complication, with long-term current use of insulin [facility])   [facility] Jerrol Banana., MD   1 year ago Type 2 diabetes mellitus with complication, with long-term current use of insulin [facility])   [facility] Jerrol Banana., MD   1 year ago Type 2 diabetes mellitus without complication, with long-term current use of insulin [facility])   [facility] Jerrol Banana., MD   2 years ago Type 2 diabetes mellitus without complication, with long-term current use of insulin [facility])   [facility] Jerrol Banana., MD   2 years ago Simple chronic bronchitis [facility])   [facility] Jerrol Banana., MD

## 2019-09-18 ENCOUNTER — Other Ambulatory Visit: Payer: Self-pay | Admitting: Family Medicine

## 2019-09-18 MED ORDER — LEVEMIR FLEXTOUCH 100 UNIT/ML ~~LOC~~ SOPN: 15.0000 [IU] | PEN_INJECTOR | Freq: Every day | SUBCUTANEOUS | 1 refills | Status: DC

## 2019-09-18 NOTE — Telephone Encounter (Signed)
Requested medication (s) are due for refill today: yes  Requested medication (s) are on the active medication list: yes  Last refill:  09/05/19  Future visit scheduled: yes  Notes to clinic:  no valid encounter within last 6 months     Requested Prescriptions  Pending Prescriptions Disp Refills   Insulin Detemir (LEVEMIR FLEXTOUCH) 100 UNIT/ML Pen 15 mL 5      Endocrinology:  Diabetes - Insulins Failed - 09/18/2019  1:07 PM      Failed - HBA1C is between 0 and 7.9 and within 180 days    Hemoglobin A1C  Date Value Ref Range Status  08/23/2018 8.2 (A) 4.0 - 5.6 % Final   Hgb A1c MFr Bld  Date Value Ref Range Status  03/27/2018 5.9 (H) 4.8 - 5.6 % Final    Comment:             Prediabetes: 5.7 - 6.4          Diabetes: >6.4          Glycemic control for adults with diabetes: <7.0           Failed - Valid encounter within last 6 months    Recent Outpatient Visits           1 year ago Type 2 diabetes mellitus with complication, with long-term current use of insulin [facility])   [facility] Jerrol Banana., MD   1 year ago Type 2 diabetes mellitus with complication, with long-term current use of insulin [facility])   [facility] Jerrol Banana., MD   1 year ago Type 2 diabetes mellitus without complication, with long-term current use of insulin [facility])   [facility] Jerrol Banana., MD   2 years ago Type 2 diabetes mellitus without complication, with long-term current use of insulin [facility])   [facility] Jerrol Banana., MD   2 years ago Simple chronic bronchitis [facility])   [facility] Jerrol Banana., MD

## 2019-09-18 NOTE — Telephone Encounter (Signed)
Copied from [place] [phone]. Topic: Quick Communication - Rx Refill/Question >> Sep 18, 2019 12:56 PM Leward Quan A wrote: Medication: LEVEMIR FLEXTOUCH 100 UNIT/ML Pen  Appointment scheduled 09/30/19  Has the patient contacted their pharmacy? Yes.   (Agent: If no, request that the patient contact the pharmacy for the refill.) (Agent: If yes, when and what did the pharmacy advise?)  Preferred Pharmacy (with phone number or street name): [id number] [place], [place] - [address]  Phone:  [phone] Fax:  [phone]     Agent: Please be advised that RX refills may take up to 3 business days. We ask that you follow-up with your pharmacy.

## 2019-09-30 ENCOUNTER — Ambulatory Visit (INDEPENDENT_AMBULATORY_CARE_PROVIDER_SITE_OTHER): Payer: Medicare Other | Admitting: Family Medicine

## 2019-09-30 ENCOUNTER — Other Ambulatory Visit: Payer: Self-pay

## 2019-09-30 DIAGNOSIS — E1121 Type 2 diabetes mellitus with diabetic nephropathy: Secondary | ICD-10-CM

## 2019-09-30 DIAGNOSIS — I2510 Atherosclerotic heart disease of native coronary artery without angina pectoris: Secondary | ICD-10-CM

## 2019-09-30 DIAGNOSIS — E1159 Type 2 diabetes mellitus with other circulatory complications: Secondary | ICD-10-CM | POA: Diagnosis not present

## 2019-09-30 DIAGNOSIS — I10 Essential (primary) hypertension: Secondary | ICD-10-CM

## 2019-09-30 DIAGNOSIS — M1610 Unilateral primary osteoarthritis, unspecified hip: Secondary | ICD-10-CM

## 2019-09-30 DIAGNOSIS — E782 Mixed hyperlipidemia: Secondary | ICD-10-CM

## 2019-09-30 NOTE — Progress Notes (Signed)
[name] Female    DOB: [birth year]   86 y.o.   MRN: [id number] Visit Date: 09/30/2019  Today's Provider: Wilhemena Durie, MD   No chief complaint on file.  Subjective:    Virtual Visit via Telephone Note  I connected with Gabriela [name] on 09/30/19 at  1:20 PM EST by telephone and verified that I am speaking with the correct person using two identifiers.  Location: Patient: Home Provider: Office   I discussed the limitations, risks, security and privacy concerns of performing an evaluation and management service by telephone and the availability of in person appointments. I also discussed with the patient that there may be a patient responsible charge related to this service. The patient expressed understanding and agreed to proceed.    HPI  86 year old diabetic patient still living at home.  She states her blood sugars are fine and she just got her insulin refilled.  She does not leave the house much.  She is not had her Covid vaccine nor does she have it scheduled.  She has not been seen in the office in over a year and has not had blood work in almost a year and a half.  Again, she feels well and has had no chest pain no syncope no hypoglycemia no falls.  She even states she sleeps well.  She knows the answer when asked her how much insulin she is taken, she states 15 units.  Allergies  Allergen Reactions  . Sulfa Antibiotics Rash     Current Outpatient Medications:  .  ACCU-CHEK AVIVA PLUS test strip, USE TO TEST SUGAR TWICE A DAY, Disp: 100 strip, Rfl: 1 .  ACCU-CHEK FASTCLIX LANCETS MISC, To check blood sugars once a day. DX E11.9, Disp: 102 each, Rfl: 12 .  aspirin EC 81 MG tablet, Take 81 mg by mouth daily., Disp: , Rfl:  .  Blood Glucose Monitoring Suppl (ACCU-CHEK AVIVA PLUS) w/Device KIT, 1 each by Does not apply route daily. Dx E11.9, Disp: 1 kit, Rfl: 0 .  furosemide (LASIX) 20 MG tablet, TAKE 1 TABLET BY MOUTH EVERY DAY, Disp: 90 tablet,  Rfl: 0 .  hydrochlorothiazide (HYDRODIURIL) 25 MG tablet, TAKE 1 TABLET BY MOUTH EVERY DAY, Disp: 90 tablet, Rfl: 3 .  Insulin Detemir (LEVEMIR FLEXTOUCH) 100 UNIT/ML Pen, Inject 15 Units into the skin daily. In the afternoon, Disp: 3 mL, Rfl: 1 .  Insulin Pen Needle (SURE COMFORT PEN NEEDLES) 31G X 5 MM MISC, USE WITH INSULIN DOSES, Disp: 100 each, Rfl: 5 .  metoprolol tartrate (LOPRESSOR) 25 MG tablet, TAKE 1/2 TABLET BY MOUTH DAILY, Disp: 45 tablet, Rfl: 3 .  timolol (TIMOPTIC) 0.5 % ophthalmic solution, Place 1 drop into both eyes every morning., Disp: , Rfl:   Review of Systems  Social History   Tobacco Use  . Smoking status: Never Smoker  . Smokeless tobacco: Never Used  Substance Use Topics  . Alcohol use: No      Objective:   There were no vitals taken for this visit. There were no vitals filed for this visit.There is no height or weight on file to calculate BMI.   Physical Exam   No results found for any visits on 09/30/19.     Assessment & Plan     1. Diabetic nephropathy associated with type 2 diabetes mellitus ([place]) We will obtain lab work on next visit whether she is fasting or not. Advised patient we will schedule  this in May or June. She also needs to have a Covid vaccine arranged.  2. Type 2 diabetes mellitus with other circulatory complication, unspecified whether long term insulin use (HCC) A1c on next visit.  Insulin has been refilled.  3. Arthritis, hip   4. Essential (primary) hypertension   5. Left main coronary artery disease Asymptomatic.  6. Mixed hyperlipidemia   I discussed the assessment and treatment plan with the patient. The patient was provided an opportunity to ask questions and all were answered. The patient agreed with the plan and demonstrated an understanding of the instructions.   The patient was advised to call back or seek an in-person evaluation if the symptoms worsen or if the condition fails to improve as  anticipated.  I provided 12 minutes of non-face-to-face time during this encounter.     Richard Cranford Mon, MD  [facility]

## 2019-11-13 ENCOUNTER — Telehealth: Payer: Self-pay | Admitting: Family Medicine

## 2019-11-13 NOTE — Telephone Encounter (Signed)
Left message for patient to schedule Annual Wellness Visit.  Please schedule with Nurse Health Advisor Victoria Britt, RN at [place]  

## 2019-11-26 ENCOUNTER — Other Ambulatory Visit: Payer: Self-pay

## 2019-11-26 ENCOUNTER — Other Ambulatory Visit
Admission: RE | Admit: 2019-11-26 | Discharge: 2019-11-26 | Disposition: A | Discharge status: Home / Self Care | Payer: Medicare Other | Source: Ambulatory Visit | Attending: Infectious Diseases | Admitting: Infectious Diseases

## 2019-11-26 ENCOUNTER — Encounter: Payer: Self-pay | Admitting: Infectious Diseases

## 2019-11-26 ENCOUNTER — Ambulatory Visit: Payer: Medicare Other | Attending: Infectious Diseases | Admitting: Infectious Diseases

## 2019-11-26 VITALS — BP 123/72 | HR 67 | Temp 98.8°F | Resp 16 | Ht 64.0 in | Wt 134.0 lb

## 2019-11-26 DIAGNOSIS — L089 Local infection of the skin and subcutaneous tissue, unspecified: Secondary | ICD-10-CM | POA: Insufficient documentation

## 2019-11-26 DIAGNOSIS — Y929 Unspecified place or not applicable: Secondary | ICD-10-CM | POA: Insufficient documentation

## 2019-11-26 DIAGNOSIS — L988 Other specified disorders of the skin and subcutaneous tissue: Secondary | ICD-10-CM | POA: Insufficient documentation

## 2019-11-26 DIAGNOSIS — Y939 Activity, unspecified: Secondary | ICD-10-CM | POA: Insufficient documentation

## 2019-11-26 DIAGNOSIS — X58XXXA Exposure to other specified factors, initial encounter: Secondary | ICD-10-CM | POA: Insufficient documentation

## 2019-11-26 DIAGNOSIS — T148XXA Other injury of unspecified body region, initial encounter: Secondary | ICD-10-CM | POA: Insufficient documentation

## 2019-11-26 NOTE — Patient Instructions (Signed)
You are here for follow up of the sinus wound you have on your rt thigh for many years now- your main complaint is the rt hip pain- I did culture from the discharge today and will give antibiotics as per the result- for pain I recommend you contact your PCP. Meanwhile would recommend tylenol for pain control.

## 2019-11-26 NOTE — Progress Notes (Signed)
NAME: Gabriela [name]  DOB: Aug 05, 1926  MRN: [id number]  Date/Time: 11/26/2019 12:16 PM Subjective:   Patient is here for follow-up.  She was last seen in 2019.  She has a history of her right thigh wound. ? Gabriela [name] is a 86 y.o.female  with a history of DM, HTN, glaucoma, CABG/mitral valve repair chronic rt thigh wound for more than 3 years  PT is here with her daughter .  She complains of pain in the right hip.  She has chronic arthritis. She had rt hip replacement many years ago and later had a revision. The site healed well.  She has had this wound for 3 years and cultures taken from the wound had no bacteria- last time when she saw Dr.Fitzgerald he did an AFB culture and that was negative. She was given Doxy in June 2019 but she has not been on any antibiotic since then.   No fever or chills MRI of the thigh done in march 2019 showed Stranding in subcutaneous fat of the anterior right thigh and negative for abscess, osteomyelitis, myositis or septic joint.  She has restricted mobility but lives on her own with help of aids visiting her everyday  Past Medical History:  Diagnosis Date  . Arthritis   . Diabetes mellitus ([place])   . Glaucoma   . Hyperlipidemia   . Hypertension   . Shortness of breath    with exertion    Past Surgical History:  Procedure Laterality Date  . CARDIAC CATHETERIZATION  04/22/13  . CHOLECYSTECTOMY    . CORONARY ARTERY BYPASS GRAFT N/A 05/31/2013   Procedure: Coronary Artery Bypass Grafting Times Three Using Left Internal Mammary Artery and Right Saphenous Leg Vein Harvested Endoscopically;  Surgeon: Ivin Poot, MD;  Location: [place];  Service: Open Heart Surgery;  Laterality: N/A;  . EYE SURGERY    . HIP SURGERY     right  . INTRAOPERATIVE TRANSESOPHAGEAL ECHOCARDIOGRAM N/A 05/31/2013   Procedure: INTRAOPERATIVE TRANSESOPHAGEAL ECHOCARDIOGRAM;  Surgeon: Ivin Poot, MD;  Location: [place];  Service: Open Heart Surgery;  Laterality: N/A;  .  JOINT REPLACEMENT    . MITRAL VALVE REPAIR N/A 05/31/2013   Procedure: MITRAL VALVE REPAIR (MVR);  Surgeon: Ivin Poot, MD;  Location: [place];  Service: Open Heart Surgery;  Laterality: N/A;  . TONSILLECTOMY      Social History   Socioeconomic History  . Marital status: Widowed    Spouse name: Not on file  . Number of children: 7  . Years of education: Not on file  . Highest education level: Not on file  Occupational History  . Occupation: retired  Tobacco Use  . Smoking status: Never Smoker  . Smokeless tobacco: Never Used  Substance and Sexual Activity  . Alcohol use: No  . Drug use: No  . Sexual activity: Never  Other Topics Concern  . Not on file  Social History Narrative  . Not on file   Social Determinants of Health   Financial Resource Strain:   . Difficulty of Paying Living Expenses:   Food Insecurity:   . Worried About Charity fundraiser in the Last Year:   . Arboriculturist in the Last Year:   Transportation Needs:   . Film/video editor (Medical):   Marland Kitchen Lack of Transportation (Non-Medical):   Physical Activity:   . Days of Exercise per Week:   . Minutes of Exercise per Session:   Stress:   . Feeling  of Stress :   Social Connections:   . Frequency of Communication with Friends and Family:   . Frequency of Social Gatherings with Friends and Family:   . Attends Religious Services:   . Active Member of Clubs or Organizations:   . Attends Archivist Meetings:   Marland Kitchen Marital Status:   Intimate Partner Violence:   . Fear of Current or Ex-Partner:   . Emotionally Abused:   Marland Kitchen Physically Abused:   . Sexually Abused:     Family History  Problem Relation Age of Onset  . Diabetes Sister   . Hypertension Mother    Allergies  Allergen Reactions  . Sulfa Antibiotics Rash    ? Current Outpatient Medications  Medication Sig Dispense Refill  . ACCU-CHEK AVIVA PLUS test strip USE TO TEST SUGAR TWICE A DAY 100 strip 1  . ACCU-CHEK FASTCLIX  LANCETS MISC To check blood sugars once a day. DX E11.9 102 each 12  . aspirin EC 81 MG tablet Take 81 mg by mouth daily.    . Blood Glucose Monitoring Suppl (ACCU-CHEK AVIVA PLUS) w/Device KIT 1 each by Does not apply route daily. Dx E11.9 1 kit 0  . furosemide (LASIX) 20 MG tablet TAKE 1 TABLET BY MOUTH EVERY DAY 90 tablet 0  . hydrochlorothiazide (HYDRODIURIL) 25 MG tablet TAKE 1 TABLET BY MOUTH EVERY DAY 90 tablet 3  . Insulin Detemir (LEVEMIR FLEXTOUCH) 100 UNIT/ML Pen Inject 15 Units into the skin daily. In the afternoon 5 pen 2  . Insulin Pen Needle (SURE COMFORT PEN NEEDLES) 31G X 5 MM MISC USE WITH INSULIN DOSES 100 each 5  . metoprolol tartrate (LOPRESSOR) 25 MG tablet TAKE 1/2 TABLET BY MOUTH DAILY 45 tablet 3  . timolol (TIMOPTIC) 0.5 % ophthalmic solution Place 1 drop into both eyes every morning.     No current facility-administered medications for this visit.     Abtx:  Anti-infectives (From admission, onward)   None     In wheelchair REVIEW OF SYSTEMS:  Const: negative fever, negative chills, negative weight loss Eyes: negative diplopia or visual changes, negative eye pain ENT: negative coryza, negative sore throat Resp: negative cough, hemoptysis, dyspnea Cards: negative for chest pain, palpitations, lower extremity edema GU: negative for frequency, dysuria and hematuria GI: Negative for abdominal pain, diarrhea, bleeding, constipation Skin: negative for rash and pruritus Heme: negative for easy bruising and gum/nose bleeding MS: Wheelchair-bound Generalized weakness Pain in joints Neurolo:negative for headaches, dizziness, vertigo, memory problems  Psych: negative for feelings of anxiety, depression  Endocrine: negative for thyroid, diabetes Allergy/Immunology-allergic to sulfa ? Objective:  VITALS:  BP 123/72   Pulse 67   Temp 98.8 F (37.1 C) (Oral)   Resp 16   Ht '5\' 4"'  (1.626 m)   Wt 134 lb (60.8 kg)   SpO2 98%   BMI 23.00 kg/m  PHYSICAL EXAM:  Patient in wheelchair and hence examination is limited General: Alert, cooperative, no distress, appears stated age. In wheel chair, hard of hearing Head: Normocephalic, without obvious abnormality, atraumatic. Eyes: Conjunctivae clear, anicteric sclerae. Pupils are equal ENT Nares normal. No drainage or sinus tenderness. Lips, mucosa, and tongue normal. No Thrush Neck: Supple,  Lungs: Clear to auscultation bilaterally. No Wheezing or Rhonchi. No rales. Heart: Regular rate and rhythm, no murmur, rub or gallop. Abdomen: Soft,  Extremities: rt thigh wound- 11/26/19 Minimal clear discharge from the small sinus No erythema around No tenderness No warmth     2019  Skin: No  rashes or lesions. Or bruising Lymph: Cervical, supraclavicular normal. Neurologic: Grossly non-focal Pertinent Labs Lab Results None recently    Impression/Recommendation ?86 y.o.female  with a history of DM, HTN, glaucoma, CABG/mitral valve repairchronic rt thigh wound is here to see me for the same.She was followed by  [facility] clinic ID who is no longer there.  ?  Chronic rt thigh wound- with surrounding fatty tissue atrophy. unclear etiology- MRI thigh done in March 2019 does not show underlying osteomyelitis. never had a positive wound culture-does not look infected.  Center clear fluid for culture and depending on the results we will treat her with antibiotic if needed.  Chronic right hip pain Asked her to take Tylenol until she sees her PCP ? DM- on insulin  HTn on metoprolol  CAD- S/p CABG   Discussed with patient and her daughter Will call her once I have the results of the culture. Follow up as needed

## 2019-11-29 LAB — AEROBIC CULTURE W GRAM STAIN (SUPERFICIAL SPECIMEN)

## 2019-12-06 NOTE — Progress Notes (Signed)
Established patient visit   I,[name],acting as a scribe for Wilhemena Durie, MD.,have documented all relevant documentation on the behalf of Wilhemena Durie, MD,as directed by  Wilhemena Durie, MD while in the presence of Wilhemena Durie, MD.   [name]   DOB: 13-May-1926   86 y.o. Female  MRN: [id number] Visit Date: 12/09/2019  Today's healthcare provider: Wilhemena Durie, MD   Chief Complaint  Patient presents with  . Leg Pain   Subjective    Patient is here concerning right hip pain. Patient has been seeing wound care for wound on right hip from a hip surgery years ago. Wound care has been treating with antibiotics but they are unable to treat pain.  When asked specifically about the pain she puts her hand on the area of her right greater trochanter and down the outside of the right leg to the knee.  She said it hurts all the time but weightbearing seems to make it worse.  She has follow-up appointment with orthopedics.  She states her sugars have been doing well she has no other complaints.  Due to the pandemic she has not been into the office for routine follow-up and has not had lab work since late 2019.     Medications: Outpatient Medications Prior to Visit  Medication Sig  . ACCU-CHEK AVIVA PLUS test strip USE TO TEST SUGAR TWICE A DAY  . ACCU-CHEK FASTCLIX LANCETS MISC To check blood sugars once a day. DX E11.9  . aspirin EC 81 MG tablet Take 81 mg by mouth daily.  . Blood Glucose Monitoring Suppl (ACCU-CHEK AVIVA PLUS) w/Device KIT 1 each by Does not apply route daily. Dx E11.9  . Insulin Detemir (LEVEMIR FLEXTOUCH) 100 UNIT/ML Pen Inject 15 Units into the skin daily. In the afternoon  . Insulin Pen Needle (SURE COMFORT PEN NEEDLES) 31G X 5 MM MISC USE WITH INSULIN DOSES  . metoprolol tartrate (LOPRESSOR) 25 MG tablet TAKE 1/2 TABLET BY MOUTH DAILY  . timolol (TIMOPTIC) 0.5 % ophthalmic solution Place 1 drop into both eyes every  morning.  . furosemide (LASIX) 20 MG tablet TAKE 1 TABLET BY MOUTH EVERY DAY (Patient not taking: Reported on 12/09/2019)  . hydrochlorothiazide (HYDRODIURIL) 25 MG tablet TAKE 1 TABLET BY MOUTH EVERY DAY (Patient not taking: Reported on 12/09/2019)   No facility-administered medications prior to visit.    Review of Systems  Constitutional: Negative for appetite change, chills, fatigue and fever.  Eyes: Negative.   Respiratory: Negative for chest tightness and shortness of breath.   Cardiovascular: Negative for chest pain and palpitations.  Gastrointestinal: Negative for abdominal pain, nausea and vomiting.  Endocrine: Negative.   Musculoskeletal: Positive for arthralgias.  Allergic/Immunologic: Negative.   Neurological: Negative for dizziness and weakness.  Hematological: Negative.   Psychiatric/Behavioral: Negative.     Last metabolic panel Lab Results  Component Value Date   GLUCOSE 141 (H) 12/09/2019   NA 144 12/09/2019   K 4.4 12/09/2019   CL 108 (H) 12/09/2019   CO2 19 (L) 12/09/2019   BUN 46 (H) 12/09/2019   CREATININE 1.34 (H) 12/09/2019   GFRNONAA 35 (L) 12/09/2019   GFRAA 40 (L) 12/09/2019   CALCIUM 8.7 12/09/2019   PROT 8.2 12/09/2019   ALBUMIN 4.3 12/09/2019   LABGLOB 3.9 12/09/2019   AGRATIO 1.1 (L) 12/09/2019   BILITOT 0.5 12/09/2019   ALKPHOS 109 12/09/2019   AST 14 12/09/2019   ALT 5 12/09/2019  ANIONGAP 9 05/19/2018       Objective    BP 113/68 (BP Location: Right Arm, Patient Position: Sitting, Cuff Size: Large)   Pulse 64   Temp (!) 96.6 F (35.9 C) (Other (Comment))   Resp 18   Ht '5\' 4"'  (1.626 m)   Wt 134 lb (60.8 kg)   SpO2 99%   BMI 23.00 kg/m     Physical Exam Vitals and nursing note reviewed.  Constitutional:      Appearance: Normal appearance. She is normal weight.  HENT:     Right Ear: Tympanic membrane normal.     Left Ear: Tympanic membrane normal.     Nose: Nose normal.     Mouth/Throat:     Mouth: Mucous membranes are  moist.     Pharynx: Oropharynx is clear.  Eyes:     Conjunctiva/sclera: Conjunctivae normal.  Cardiovascular:     Rate and Rhythm: Normal rate and regular rhythm.     Pulses: Normal pulses.     Heart sounds: Normal heart sounds.  Pulmonary:     Effort: Pulmonary effort is normal.     Breath sounds: Normal breath sounds.  Abdominal:     General: Bowel sounds are normal.     Palpations: Abdomen is soft.  Musculoskeletal:     Cervical back: Normal range of motion and neck supple.     Comments: She has mild tenderness over the right greater trochanter and some tenderness along the right knee joint line.  There is no effusion.  No warmth.  Skin:    General: Skin is warm and dry.  Neurological:     Mental Status: She is alert and oriented to person, place, and time.  Psychiatric:        Mood and Affect: Mood normal.        Behavior: Behavior normal.        Thought Content: Thought content normal.        Judgment: Judgment normal.       No results found for any visits on 12/09/19.  Assessment & Plan    1. Type 2 diabetes mellitus with other circulatory complication, unspecified whether long term insulin use (HCC) Continue present regimen of Levemir 15 units daily depending on A1c. - CBC w/Diff/Platelet - Comprehensive Metabolic Panel (CMET) - Hemoglobin A1C - Sed Rate (ESR) - C-reactive protein  2. Mixed hyperlipidemia She is not presently on a statin.  I think she has been statin intolerant in the past.  Check LDL today - CBC w/Diff/Platelet - Comprehensive Metabolic Panel (CMET) - Hemoglobin A1C - Sed Rate (ESR) - C-reactive protein  3. Essential (primary) hypertension On metoprolol and HCTZ.  Consider changing HCTZ to losartan. - CBC w/Diff/Platelet - Comprehensive Metabolic Panel (CMET) - Hemoglobin A1C - Sed Rate (ESR) - C-reactive protein  4. 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 Cellulitis, unspecified cellulitis site Followed by orthopedics and also recently seen by infectious disease.  Sed rate and C-reactive protein obtained for ID - CBC w/Diff/Platelet - Comprehensive Metabolic Panel (CMET) - Hemoglobin A1C - Sed Rate (ESR) - C-reactive protein  6. Anemia, unspecified type Anemia of chronic disease - CBC w/Diff/Platelet - Comprehensive Metabolic Panel (CMET) - Hemoglobin A1C - Sed Rate (ESR) - C-reactive protein  7. Pain of lower extremity, unspecified laterality I think this is bursitis but also probably knee  OA.  She could have an LS radiculopathy but she has appointment with Ortho in the near future.  We will presently treat her pain with tramadol.  Patient and her daughter are advised of risk and benefits.  Avoid NSAID, she has been taking Tylenol. - traMADol (ULTRAM) 50 MG tablet; Take 1 tablet (50 mg total) by mouth every 6 (six) hours as needed for up to 5 days.  Dispense: 100 tablet; Refill: 4   No follow-ups on file.         Susana Gripp Cranford Mon, MD  [facility] [phone] (phone) [phone] (fax)  [place]

## 2019-12-09 ENCOUNTER — Ambulatory Visit (INDEPENDENT_AMBULATORY_CARE_PROVIDER_SITE_OTHER): Payer: Medicare Other | Admitting: Family Medicine

## 2019-12-09 ENCOUNTER — Other Ambulatory Visit: Payer: Self-pay

## 2019-12-09 ENCOUNTER — Encounter: Payer: Self-pay | Admitting: Family Medicine

## 2019-12-09 VITALS — BP 113/68 | HR 64 | Temp 96.6°F | Resp 18 | Ht 64.0 in | Wt 134.0 lb

## 2019-12-09 DIAGNOSIS — I10 Essential (primary) hypertension: Secondary | ICD-10-CM

## 2019-12-09 DIAGNOSIS — M79606 Pain in leg, unspecified: Secondary | ICD-10-CM

## 2019-12-09 DIAGNOSIS — L0390 Cellulitis, unspecified: Secondary | ICD-10-CM

## 2019-12-09 DIAGNOSIS — E782 Mixed hyperlipidemia: Secondary | ICD-10-CM | POA: Diagnosis not present

## 2019-12-09 DIAGNOSIS — E1159 Type 2 diabetes mellitus with other circulatory complications: Secondary | ICD-10-CM

## 2019-12-09 DIAGNOSIS — N289 Disorder of kidney and ureter, unspecified: Secondary | ICD-10-CM | POA: Diagnosis not present

## 2019-12-09 DIAGNOSIS — D649 Anemia, unspecified: Secondary | ICD-10-CM

## 2019-12-10 LAB — COMPREHENSIVE METABOLIC PANEL
ALT: 5 IU/L (ref 0–32)
AST: 14 IU/L (ref 0–40)
Albumin/Globulin Ratio: 1.1 — ABNORMAL LOW (ref 1.2–2.2)
Albumin: 4.3 g/dL (ref 3.5–4.6)
Alkaline Phosphatase: 109 IU/L (ref 39–117)
BUN/Creatinine Ratio: 34 — ABNORMAL HIGH (ref 12–28)
BUN: 46 mg/dL — ABNORMAL HIGH (ref 10–36)
Bilirubin Total: 0.5 mg/dL (ref 0.0–1.2)
CO2: 19 mmol/L — ABNORMAL LOW (ref 20–29)
Calcium: 8.7 mg/dL (ref 8.7–10.3)
Chloride: 108 mmol/L — ABNORMAL HIGH (ref 96–106)
Creatinine, Ser: 1.34 mg/dL — ABNORMAL HIGH (ref 0.57–1.00)
GFR calc Af Amer: 40 mL/min/{1.73_m2} — ABNORMAL LOW (ref >59)
GFR calc non Af Amer: 35 mL/min/{1.73_m2} — ABNORMAL LOW (ref >59)
Globulin, Total: 3.9 g/dL (ref 1.5–4.5)
Glucose: 141 mg/dL — ABNORMAL HIGH (ref 65–99)
Potassium: 4.4 mmol/L (ref 3.5–5.2)
Sodium: 144 mmol/L (ref 134–144)
Total Protein: 8.2 g/dL (ref 6.0–8.5)

## 2019-12-10 LAB — C-REACTIVE PROTEIN: CRP: 5 mg/L (ref 0–10)

## 2019-12-10 LAB — CBC WITH DIFFERENTIAL/PLATELET
Basophils Absolute: 0 10*3/uL (ref 0.0–0.2)
Basos: 1 %
EOS (ABSOLUTE): 0 10*3/uL (ref 0.0–0.4)
Eos: 0 %
Hematocrit: 28.2 % — ABNORMAL LOW (ref 34.0–46.6)
Hemoglobin: 9.2 g/dL — ABNORMAL LOW (ref 11.1–15.9)
Immature Grans (Abs): 0 10*3/uL (ref 0.0–0.1)
Immature Granulocytes: 1 %
Lymphocytes Absolute: 2.6 10*3/uL (ref 0.7–3.1)
Lymphs: 41 %
MCH: 26.7 pg (ref 26.6–33.0)
MCHC: 32.6 g/dL (ref 31.5–35.7)
MCV: 82 fL (ref 79–97)
Monocytes Absolute: 0.5 10*3/uL (ref 0.1–0.9)
Monocytes: 8 %
Neutrophils Absolute: 3.1 10*3/uL (ref 1.4–7.0)
Neutrophils: 49 %
Platelets: 130 10*3/uL — ABNORMAL LOW (ref 150–450)
RBC: 3.45 x10E6/uL — ABNORMAL LOW (ref 3.77–5.28)
RDW: 14.4 % (ref 11.7–15.4)
WBC: 6.3 10*3/uL (ref 3.4–10.8)

## 2019-12-10 LAB — SEDIMENTATION RATE: Sed Rate: 51 mm/hr — ABNORMAL HIGH (ref 0–40)

## 2019-12-10 LAB — HEMOGLOBIN A1C
Est. average glucose Bld gHb Est-mCnc: 157 mg/dL
Hgb A1c MFr Bld: 7.1 % — ABNORMAL HIGH (ref 4.8–5.6)

## 2019-12-10 MED ORDER — TRAMADOL HCL 50 MG PO TABS: 50.0000 mg | ORAL_TABLET | Freq: Four times a day (QID) | ORAL | 4 refills | Status: AC | PRN

## 2019-12-11 ENCOUNTER — Telehealth: Payer: Self-pay

## 2019-12-11 NOTE — Telephone Encounter (Signed)
-----   Message from Jerrol Banana., MD sent at 12/11/2019  2:32 PM EDT ----- Labs all stable in 86 year old with chronic diseases.

## 2019-12-11 NOTE — Telephone Encounter (Signed)
Patient's daughter advised.  

## 2019-12-30 ENCOUNTER — Other Ambulatory Visit: Payer: Self-pay | Admitting: Family Medicine

## 2020-02-04 ENCOUNTER — Other Ambulatory Visit: Payer: Self-pay | Admitting: Family Medicine

## 2020-02-04 NOTE — Telephone Encounter (Signed)
Requested Prescriptions  Pending Prescriptions Disp Refills  . furosemide (LASIX) 20 MG tablet [Pharmacy Med Name: FUROSEMIDE 20 MG TABLET] 90 tablet 0    Sig: TAKE 1 TABLET BY MOUTH EVERY DAY     Cardiovascular:  Diuretics - Loop Failed - 02/04/2020  2:01 AM      Failed - Cr in normal range and within 360 days    Creat  Date Value Ref Range Status  06/13/2017 1.33 (H) 0.60 - 0.88 mg/dL Final    Comment:    For patients >84 years of age, the reference limit for Creatinine is approximately 13% higher for people identified as African-American. .    Creatinine, Ser  Date Value Ref Range Status  12/09/2019 1.34 (H) 0.57 - 1.00 mg/dL Final         Passed - K in normal range and within 360 days    Potassium  Date Value Ref Range Status  12/09/2019 4.4 3.5 - 5.2 mmol/L Final         Passed - Ca in normal range and within 360 days    Calcium  Date Value Ref Range Status  12/09/2019 8.7 8.7 - 10.3 mg/dL Final   Calcium, Ion  Date Value Ref Range Status  06/01/2013 1.28 1.13 - 1.30 mmol/L Final         Passed - Na in normal range and within 360 days    Sodium  Date Value Ref Range Status  12/09/2019 144 134 - 144 mmol/L Final         Passed - Last BP in normal range    BP Readings from Last 1 Encounters:  12/09/19 113/68         Passed - Valid encounter within last 6 months    Recent Outpatient Visits          1 month ago Type 2 diabetes mellitus with other circulatory complication, unspecified whether long term insulin use [facility])   [facility] Jerrol Banana., MD   4 months ago Diabetic nephropathy associated with type 2 diabetes mellitus [facility])   [facility] Jerrol Banana., MD   1 year ago Type 2 diabetes mellitus with complication, with long-term current use of insulin [facility])   [facility] Jerrol Banana., MD   1 year ago Type 2 diabetes mellitus with complication, with long-term current use of insulin  [facility])   [facility] Jerrol Banana., MD   1 year ago Type 2 diabetes mellitus without complication, with long-term current use of insulin [facility])   [facility] Jerrol Banana., MD      Future Appointments            In 1 month Jerrol Banana., MD [facility]

## 2020-02-14 DIAGNOSIS — H903 Sensorineural hearing loss, bilateral: Secondary | ICD-10-CM | POA: Diagnosis not present

## 2020-02-14 DIAGNOSIS — H6123 Impacted cerumen, bilateral: Secondary | ICD-10-CM | POA: Diagnosis not present

## 2020-02-26 DIAGNOSIS — H905 Unspecified sensorineural hearing loss: Secondary | ICD-10-CM | POA: Diagnosis not present

## 2020-02-28 ENCOUNTER — Telehealth: Payer: Self-pay

## 2020-02-28 NOTE — Telephone Encounter (Signed)
Copied from [place] [id number]. Topic: General - Other >> Feb 28, 2020  1:12 PM Alanda Slim E wrote: Reason for CRM: [facility] called about some paper they had faxed over/ person stated they will call office back after lunch to speak with someone

## 2020-02-28 NOTE — Telephone Encounter (Signed)
Lorriane Shire w/ [employer] needing a call back regarding patient needing incontinence supplies. A form was faxed over for approval on 02-11-2020.  Please call Lorriane Shire back to let her know the status the form as soon as possible at [phone].  Thanks, [employer]

## 2020-03-05 NOTE — Progress Notes (Signed)
Trena Platt Cummings,acting as a scribe for Wilhemena Durie, MD.,have documented all relevant documentation on the behalf of Wilhemena Durie, MD,as directed by  Wilhemena Durie, MD while in the presence of Wilhemena Durie, MD.  Established patient visit   [name]   DOB: 08-01-1926   86 y.o. Female  MRN: [id number] Visit Date: 03/09/2020  Today's healthcare provider: Wilhemena Durie, MD   Chief Complaint  Patient presents with  . Diabetes  . Hyperlipidemia  . Hypertension   Subjective    HPI patient still lives at home. She was the mother of 7 and has lost 4 children in her lifetime.  She cannot count the number of grandchildren and great-grandchildren she has.  She has had her Covid vaccine.  She feels well and has no new complaints today.  She has not fallen.  She does not feel depressed.  She states her blood sugars have been okay. She has had her Covid vaccine. Diabetes Mellitus Type II, follow-up   Lab Results  Component Value Date   HGBA1C 7.1 (H) 12/09/2019   HGBA1C 8.2 (A) 08/23/2018   HGBA1C 5.9 (H) 03/27/2018   Last seen for diabetes 3 months ago.  Management since then includes; Continue present regimen of Levemir 15 units daily depending on A1c. She reports excellent compliance with treatment. She is not having side effects.   Home blood sugar records: fasting range: 118  Episodes of hypoglycemia? No    Current insulin regiment: Levemir 100 units daily Most Recent Eye Exam:   --------------------------------------------------------------------------------------------------- Hypertension, follow-up  BP Readings from Last 3 Encounters:  03/09/20 (!) 158/71  12/09/19 113/68  11/26/19 123/72   Wt Readings from Last 3 Encounters:  03/09/20 123 lb 12.8 oz (56.2 kg)  12/09/19 134 lb (60.8 kg)  11/26/19 134 lb (60.8 kg)     She was last seen for hypertension 3 months ago.  BP at that visit was 113/68. Management since that  visit includes; On metoprolol and HCTZ.  Consider changing HCTZ to losartan. She reports excellent compliance with treatment. She is not having side effects.  She is not exercising. She is adherent to low salt diet.   Outside blood pressures are being checked.  She does not smoke.  Use of agents associated with hypertension: NSAIDS.   --------------------------------------------------------------------------------------------------- Lipid/Cholesterol, follow-up  Last Lipid Panel: Lab Results  Component Value Date   CHOL 111 05/09/2016   LDLCALC 52 05/09/2016   HDL 40 05/09/2016   TRIG 96 05/09/2016    She was last seen for this 3 months ago.  Management since that visit includes; She is not presently on a statin.  I think she has been statin intolerant in the past.  Check LDL today. She reports excellent compliance with treatment. She is not having side effects.  She is following a Regular, Low fat, Low Sodium diet. Current exercise: none  Last metabolic panel Lab Results  Component Value Date   GLUCOSE 141 (H) 12/09/2019   NA 144 12/09/2019   K 4.4 12/09/2019   BUN 46 (H) 12/09/2019   CREATININE 1.34 (H) 12/09/2019   GFRNONAA 35 (L) 12/09/2019   GFRAA 40 (L) 12/09/2019   CALCIUM 8.7 12/09/2019   AST 14 12/09/2019   ALT 5 12/09/2019   The ASCVD Risk score Mikey Bussing DC Jr., et al., 2013) failed to calculate for the following reasons:   The 2013 ASCVD risk score is only valid for ages 78 to 5  ---------------------------------------------------------------------------------------------------  Disorder of kidney From 12/09/2019-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 Labs checked showing-stable for her age.  Anemia, unspecified type From 12/09/2019-Anemia of chronic disease. Labs checked showing-stable for her age.   Pain of lower extremity, unspecified laterality From 12/09/2019-I think this is bursitis but also probably knee OA.  She could have an LS radiculopathy but  she has appointment with Ortho in the near future.  We will presently treat her pain with tramadol.  Patient and her daughter are advised of risk and benefits.  Avoid NSAID, she has been taking Tylenol.   Social History   Tobacco Use  . Smoking status: Never Smoker  . Smokeless tobacco: Never Used  Substance Use Topics  . Alcohol use: No  . Drug use: No       Medications: Outpatient Medications Prior to Visit  Medication Sig  . ACCU-CHEK AVIVA PLUS test strip USE TO TEST SUGAR TWICE A DAY  . ACCU-CHEK FASTCLIX LANCETS MISC To check blood sugars once a day. DX E11.9  . aspirin EC 81 MG tablet Take 81 mg by mouth daily.  . Blood Glucose Monitoring Suppl (ACCU-CHEK AVIVA PLUS) w/Device KIT 1 each by Does not apply route daily. Dx E11.9  . hydrochlorothiazide (HYDRODIURIL) 25 MG tablet TAKE 1 TABLET BY MOUTH EVERY DAY  . Insulin Detemir (LEVEMIR FLEXTOUCH) 100 UNIT/ML Pen Inject 15 Units into the skin daily. In the afternoon  . Insulin Pen Needle (SURE COMFORT PEN NEEDLES) 31G X 5 MM MISC USE WITH INSULIN DOSES  . metoprolol tartrate (LOPRESSOR) 25 MG tablet TAKE 1/2 TABLET BY MOUTH DAILY  . timolol (TIMOPTIC) 0.5 % ophthalmic solution Place 1 drop into both eyes every morning.  . furosemide (LASIX) 20 MG tablet TAKE 1 TABLET BY MOUTH EVERY DAY (Patient not taking: Reported on 03/09/2020)   No facility-administered medications prior to visit.    Review of Systems  Constitutional: Negative for appetite change, chills, fatigue and fever.  Eyes: Negative.   Respiratory: Negative for chest tightness and shortness of breath.   Cardiovascular: Negative for chest pain and palpitations.  Gastrointestinal: Negative for abdominal pain, nausea and vomiting.  Endocrine: Negative.   Genitourinary: Negative.   Musculoskeletal: Positive for arthralgias.  Allergic/Immunologic: Negative.   Neurological: Negative for dizziness and weakness.  Hematological: Negative.   Psychiatric/Behavioral:  Negative.        Objective    BP (!) 158/71 (BP Location: Right Arm, Patient Position: Sitting, Cuff Size: Normal)   Pulse 61   Temp (!) 96.9 F (36.1 C) (Temporal)   Ht _0  (1.626 m)   Wt 123 lb 12.8 oz (56.2 kg)   BMI 21.25 kg/m  BP Readings from Last 3 Encounters:  03/09/20 (!) 158/71  12/09/19 113/68  11/26/19 123/72   Wt Readings from Last 3 Encounters:  03/09/20 123 lb 12.8 oz (56.2 kg)  12/09/19 134 lb (60.8 kg)  11/26/19 134 lb (60.8 kg)      Physical Exam Vitals and nursing note reviewed.  Constitutional:      Appearance: Normal appearance. She is normal weight.  HENT:     Right Ear: Tympanic membrane normal.     Left Ear: Tympanic membrane normal.     Nose: Nose normal.     Mouth/Throat:     Mouth: Mucous membranes are moist.     Pharynx: Oropharynx is clear.  Eyes:     Conjunctiva/sclera: Conjunctivae normal.  Cardiovascular:     Rate and Rhythm: Normal rate and regular rhythm.  Pulses: Normal pulses.     Heart sounds: Normal heart sounds.  Pulmonary:     Effort: Pulmonary effort is normal.     Breath sounds: Normal breath sounds.  Abdominal:     General: Bowel sounds are normal.     Palpations: Abdomen is soft.  Musculoskeletal:     Cervical back: Normal range of motion and neck supple.     Right lower leg: No edema.     Left lower leg: No edema.     Comments: She has mild tenderness over the right greater trochanter and some tenderness along the right knee joint line.  There is no effusion.  No warmth.  Skin:    General: Skin is warm and dry.  Neurological:     General: No focal deficit present.     Mental Status: She is alert and oriented to person, place, and time.  Psychiatric:        Mood and Affect: Mood normal.        Behavior: Behavior normal.        Thought Content: Thought content normal.        Judgment: Judgment normal.       No results found for any visits on 03/09/20.  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        Musculoskeletal and Integument   Arthritis, hip     Genitourinary   Disorder of kidney     Other   Hyperlipidemia   HLD (hyperlipidemia)   Wound of thigh    Other Visit Diagnoses    Anemia, unspecified type       Relevant Orders   CBC with Differential/Platelet (Completed)     Follow-up 4 months.  She actually is functioning well is a 86 year old.  No follow-ups on file.         Dearra Myhand Cranford Mon, MD  [facility] [phone] (phone) [phone] (fax)  [place]

## 2020-03-09 ENCOUNTER — Other Ambulatory Visit: Payer: Self-pay

## 2020-03-09 ENCOUNTER — Ambulatory Visit (INDEPENDENT_AMBULATORY_CARE_PROVIDER_SITE_OTHER): Payer: Medicare Other | Admitting: Family Medicine

## 2020-03-09 ENCOUNTER — Encounter: Payer: Self-pay | Admitting: Family Medicine

## 2020-03-09 ENCOUNTER — Telehealth: Payer: Self-pay

## 2020-03-09 VITALS — BP 158/71 | HR 61 | Temp 96.9°F | Ht 64.0 in | Wt 123.8 lb

## 2020-03-09 DIAGNOSIS — I10 Essential (primary) hypertension: Secondary | ICD-10-CM | POA: Diagnosis not present

## 2020-03-09 DIAGNOSIS — E782 Mixed hyperlipidemia: Secondary | ICD-10-CM

## 2020-03-09 DIAGNOSIS — E1121 Type 2 diabetes mellitus with diabetic nephropathy: Secondary | ICD-10-CM

## 2020-03-09 DIAGNOSIS — M1610 Unilateral primary osteoarthritis, unspecified hip: Secondary | ICD-10-CM

## 2020-03-09 DIAGNOSIS — E1159 Type 2 diabetes mellitus with other circulatory complications: Secondary | ICD-10-CM | POA: Diagnosis not present

## 2020-03-09 DIAGNOSIS — N289 Disorder of kidney and ureter, unspecified: Secondary | ICD-10-CM

## 2020-03-09 DIAGNOSIS — E7800 Pure hypercholesterolemia, unspecified: Secondary | ICD-10-CM

## 2020-03-09 DIAGNOSIS — I2510 Atherosclerotic heart disease of native coronary artery without angina pectoris: Secondary | ICD-10-CM

## 2020-03-09 DIAGNOSIS — D649 Anemia, unspecified: Secondary | ICD-10-CM | POA: Diagnosis not present

## 2020-03-09 DIAGNOSIS — S71109A Unspecified open wound, unspecified thigh, initial encounter: Secondary | ICD-10-CM

## 2020-03-09 NOTE — Telephone Encounter (Signed)
yes

## 2020-03-09 NOTE — Telephone Encounter (Signed)
Patient's daughter would like to know if it could be sent to the pharmacy as a prescription?

## 2020-03-09 NOTE — Telephone Encounter (Signed)
Patient wanted to know what they could do to help out with patients low hemoglobin since we were unable to run a1c. Please advise?

## 2020-03-09 NOTE — Progress Notes (Addendum)
Subjective:   Gabriela [name] is a 86 y.o. female who presents for Medicare Annual (Subsequent) preventive examination.  I connected with Gabriela [name] today by telephone and verified that I am speaking with the correct person using two identifiers. Location patient: home Location provider: work Persons participating in the virtual visit: patient, provider.   I discussed the limitations, risks, security and privacy concerns of performing an evaluation and management service by telephone and the availability of in person appointments. I also discussed with the patient that there may be a patient responsible charge related to this service. The patient expressed understanding and verbally consented to this telephonic visit.    Interactive audio and video telecommunications were attempted between this provider and patient, however failed, due to patient having technical difficulties OR patient did not have access to video capability.  We continued and completed visit with audio only.   Review of Systems    N/A  Cardiac Risk Factors include: advanced age (>46mn, >>2women);hypertension;diabetes mellitus     Objective:    There were no vitals filed for this visit. There is no height or weight on file to calculate BMI.  Advanced Directives 03/10/2020 05/19/2018 06/01/2013 05/28/2013  Does Patient Have a Medical Advance Directive? No No Patient does not have advance directive;Patient would not like information Patient does not have advance directive;Patient would not like information  Would patient like information on creating a medical advance directive? No - Patient declined No - Patient declined - -  Pre-existing out of facility DNR order (yellow form or pink MOST form) - - No -    Current Medications (verified) Outpatient Encounter Medications as of 03/10/2020  Medication Sig   ACCU-CHEK AVIVA PLUS test strip USE TO TEST SUGAR TWICE A DAY   ACCU-CHEK FASTCLIX LANCETS MISC To check  blood sugars once a day. DX E11.9   aspirin EC 81 MG tablet Take 81 mg by mouth daily.   Blood Glucose Monitoring Suppl (ACCU-CHEK AVIVA PLUS) w/Device KIT 1 each by Does not apply route daily. Dx E11.9   furosemide (LASIX) 20 MG tablet TAKE 1 TABLET BY MOUTH EVERY DAY   hydrochlorothiazide (HYDRODIURIL) 25 MG tablet TAKE 1 TABLET BY MOUTH EVERY DAY   Insulin Detemir (LEVEMIR FLEXTOUCH) 100 UNIT/ML Pen Inject 15 Units into the skin daily. In the afternoon   Insulin Pen Needle (SURE COMFORT PEN NEEDLES) 31G X 5 MM MISC USE WITH INSULIN DOSES   metoprolol tartrate (LOPRESSOR) 25 MG tablet TAKE 1/2 TABLET BY MOUTH DAILY   timolol (TIMOPTIC) 0.5 % ophthalmic solution Place 1 drop into both eyes every morning.   No facility-administered encounter medications on file as of 03/10/2020.    Allergies (verified) Sulfa antibiotics   History: Past Medical History:  Diagnosis Date   Arthritis    Diabetes mellitus [place]    Glaucoma    Hyperlipidemia    Hypertension    Shortness of breath    with exertion   Past Surgical History:  Procedure Laterality Date   CARDIAC CATHETERIZATION  04/22/13   CHOLECYSTECTOMY     CORONARY ARTERY BYPASS GRAFT N/A 05/31/2013   Procedure: Coronary Artery Bypass Grafting Times Three Using Left Internal Mammary Artery and Right Saphenous Leg Vein Harvested Endoscopically;  Surgeon: PIvin Poot MD;  Location[place] [facility]  Service: Open Heart Surgery;  Laterality: N/A;   EYE SURGERY     HIP SURGERY     right   INTRAOPERATIVE TRANSESOPHAGEAL ECHOCARDIOGRAM N/A 05/31/2013   Procedure:  INTRAOPERATIVE TRANSESOPHAGEAL ECHOCARDIOGRAM;  Surgeon: Ivin Poot, MD;  Location: [facility];  Service: Open Heart Surgery;  Laterality: N/A;   JOINT REPLACEMENT     MITRAL VALVE REPAIR N/A 05/31/2013   Procedure: MITRAL VALVE REPAIR (MVR);  Surgeon: Ivin Poot, MD;  Location: [facility];  Service: Open Heart Surgery;  Laterality: N/A;   TONSILLECTOMY     Family  History  Problem Relation Age of Onset   Diabetes Sister    Hypertension Mother    Social History   Socioeconomic History   Marital status: Widowed    Spouse name: Not on file   Number of children: 7   Years of education: Not on file   Highest education level: 11th grade  Occupational History   Occupation: retired  Tobacco Use   Smoking status: Never Smoker   Smokeless tobacco: Never Used  Substance and Sexual Activity   Alcohol use: No   Drug use: No   Sexual activity: Never  Other Topics Concern   Not on file  Social History Narrative   Not on file   Social Determinants of Health   Financial Resource Strain: Low Risk    Difficulty of Paying Living Expenses: Not hard at all  Food Insecurity: No Food Insecurity   Worried About Charity fundraiser in the Last Year: Never true   [place] in the Last Year: Never true  Transportation Needs: No Transportation Needs   Lack of Transportation (Medical): No   Lack of Transportation (Non-Medical): No  Physical Activity: Inactive   Days of Exercise per Week: 0 days   Minutes of Exercise per Session: 0 min  Stress: No Stress Concern Present   Feeling of Stress : Not at all  Social Connections: Moderately Isolated   Frequency of Communication with Friends and Family: More than three times a week   Frequency of Social Gatherings with Friends and Family: More than three times a week   Attends Religious Services: More than 4 times per year   Active Member of [employer] or Organizations: No   Attends Archivist Meetings: Never   Marital Status: Widowed    Tobacco Counseling Counseling given: Not Answered   Clinical Intake:  Pre-visit preparation completed: Yes  Pain : No/denies pain     Nutritional Risks: None Diabetes: Yes  How often do you need to have someone help you when you read instructions, pamphlets, or other written materials from your doctor or pharmacy?: 1 -  Never  Diabetic? Yes  Nutrition Risk Assessment:  Has the patient had any N/V/D within the last 2 months?  No  Does the patient have any non-healing wounds?  No  Has the patient had any unintentional weight loss or weight gain?  No   Diabetes:  Is the patient diabetic?  Yes  If diabetic, was a CBG obtained today?  No  Did the patient bring in their glucometer from home?  No  How often do you monitor your CBG's? Twice a day.   Financial Strains and Diabetes Management:  Are you having any financial strains with the device, your supplies or your medication? No .  Does the patient want to be seen by Chronic Care Management for management of their diabetes?  No  Would the patient like to be referred to a Nutritionist or for Diabetic Management?  No   Diabetic Exams:  Diabetic Eye Exam: Overdue for diabetic eye exam. Pt has been advised about the importance in  completing this exam. Patient advised to call and schedule an eye exam. Diabetic Foot Exam: Overdue, Pt has been advised about the importance in completing this exam. Note made to follow up on this at next in office apt.   Interpreter Needed?: No  Information entered by :: [facility], LPN   Activities of Daily Living In your present state of health, do you have any difficulty performing the following activities: 03/10/2020  Hearing? N  Vision? N  Difficulty concentrating or making decisions? N  Walking or climbing stairs? Y  Comment Due to hip pains.  Dressing or bathing? N  Doing errands, shopping? Y  Comment Does not drive.  Preparing Food and eating ? N  Using the Toilet? N  In the past six months, have you accidently leaked urine? N  Do you have problems with loss of bowel control? N  Managing your Medications? N  Managing your Finances? N  Housekeeping or managing your Housekeeping? Y  Comment Has an aid that helps with housework.  Some recent data might be hidden    Patient Care Team: Jerrol Banana., MD as PCP - General (Family Medicine) Corey Skains, MD as Referring Physician (Internal Medicine) Lorelee Cover., MD (Ophthalmology)  Indicate any recent Medical Services you may have received from other than [facility] providers in the past year (date may be approximate).     Assessment:   This is a routine wellness examination for [place].  Hearing/Vision screen No exam data present  Dietary issues and exercise activities discussed: Current Exercise Habits: The patient does not participate in regular exercise at present, Exercise limited by: orthopedic condition(s)  Goals      Patient Stated     Medication optimization (pt-stated)      Current Barriers:   Low Vision and Diminished manual dexterity  Pharmacist Clinical Goal(s):   Over the next 60 days, patient will demonstrate improved understanding of prescribed medications and rationale for usage as evidenced by patient report  Interventions:  Comprehensive medication review performed.  Updated medication list in EMR  Provided personalized medication guide  Referred to provider follow up visit, patient missed follow up in May and has not rescheduled  Patient Self Care Activities:   Self administers medications as prescribed  Attends all scheduled provider appointments  Calls pharmacy for medication refills  Initial goal documentation       Other     Obtain Annual Eye (retinal)  Exam       Recommend completing a yearly diabetic eye exam. Pt to schedule an apt with Dr Gloriann Loan this year.       Depression Screen PHQ 2/9 Scores 03/10/2020 03/29/2018 06/13/2017 05/09/2016 05/06/2015  PHQ - 2 Score 0 0 0 0 0    Fall Risk Fall Risk  03/10/2020 03/29/2018 06/13/2017 05/09/2016 05/06/2015  Falls in the past year? 0 No No Yes No  Number falls in past yr: 0 - - 1 -  Injury with Fall? 0 - - Yes -  Follow up - - - Falls evaluation completed -    Any stairs in or around the home? No  If so, are there any without  handrails? No  Home free of loose throw rugs in walkways, pet beds, electrical cords, etc? Yes  Adequate lighting in your home to reduce risk of falls? Yes   ASSISTIVE DEVICES UTILIZED TO PREVENT FALLS:  Life alert? No  Use of a cane, walker or w/c? Yes  Grab bars in the bathroom?  Yes  Shower chair or bench in shower? Yes  Elevated toilet seat or a handicapped toilet? Yes    Cognitive Function:     6CIT Screen 03/10/2020 05/09/2016  What Year? 0 points 0 points  What month? 0 points 0 points  What time? 0 points 0 points  Count back from 20 0 points 0 points  Months in reverse 2 points 4 points  Repeat phrase 6 points 8 points  Total Score 8 12    Immunizations Immunization History  Administered Date(s) Administered   Influenza, High Dose Seasonal PF 05/06/2015, 05/09/2016, 06/13/2017   Pneumococcal Conjugate-13 03/11/2014   Pneumococcal Polysaccharide-23 07/27/2005   Td 11/10/1994   Tdap 03/05/2013   Zoster 03/05/2013    TDAP status: Up to date Flu Vaccine status: Due fall 2021 Pneumococcal vaccine status: Up to date Covid-19 vaccine status: Completed vaccines  Qualifies for Shingles Vaccine? Yes   Zostavax completed Yes   Shingrix Completed?: No.    Education has been provided regarding the importance of this vaccine. Patient has been advised to call insurance company to determine out of pocket expense if they have not yet received this vaccine. Advised may also receive vaccine at local pharmacy or Health Dept. Verbalized acceptance and understanding.  Screening Tests Health Maintenance  Topic Date Due   FOOT EXAM  Never done   OPHTHALMOLOGY EXAM  Never done   URINE MICROALBUMIN  Never done   COVID-19 Vaccine (1) Never done   DEXA SCAN  09/14/2007   INFLUENZA VACCINE  03/15/2020   HEMOGLOBIN A1C  09/09/2020   TETANUS/TDAP  03/06/2023   PNA vac Low Risk Adult  Completed    Health Maintenance  Health Maintenance Due  Topic Date Due   FOOT  EXAM  Never done   OPHTHALMOLOGY EXAM  Never done   URINE MICROALBUMIN  Never done   COVID-19 Vaccine (1) Never done   DEXA SCAN  09/14/2007    Colorectal cancer screening: No longer required.  Mammogram status: No longer required.  Bone Density status: Currently due. Previous DEXA showed osteoporosis. Pt declined an order at this time.  Lung Cancer Screening: (Low Dose CT Chest recommended if Age 41-80 years, 30 pack-year currently smoking OR have quit w/in 15years.) does not qualify.   Additional Screening:  Vision Screening: Recommended annual ophthalmology exams for early detection of glaucoma and other disorders of the eye. Is the patient up to date with their annual eye exam? No, pt plans to call and set up an eye exam this year. Who is the provider or what is the name of the office in which the patient attends annual eye exams? Dr Gloriann Loan If pt is not established with a provider, would they like to be referred to a provider to establish care? No .   Dental Screening: Recommended annual dental exams for proper oral hygiene  Community Resource Referral / Chronic Care Management: CRR required this visit?  No   CCM required this visit?  No      Plan:     I have personally reviewed and noted the following in the patients chart:    Medical and social history  Use of alcohol, tobacco or illicit drugs   Current medications and supplements  Functional ability and status  Nutritional status  Physical activity  Advanced directives  List of other physicians  Hospitalizations, surgeries, and ER visits in previous 12 months  Vitals  Screenings to include cognitive, depression, and falls  Referrals and appointments  In  addition, I have reviewed and discussed with patient certain preventive protocols, quality metrics, and best practice recommendations. A written personalized care plan for preventive services as well as general preventive health recommendations were  provided to patient.     Gabriela [name], [place]   03/30/8386   Nurse Notes: Pt needs a diabetic foot exam and urine check at next in office apt. Pt plans to schedule an eye exam this year. Pt declined a DEXA order at this time. Advised pt to bring her COVID vaccine card into her next apt to update in HM. Vaccine series was completed.

## 2020-03-09 NOTE — Telephone Encounter (Signed)
Try iron every other day.

## 2020-03-10 ENCOUNTER — Ambulatory Visit (INDEPENDENT_AMBULATORY_CARE_PROVIDER_SITE_OTHER): Payer: Medicare Other

## 2020-03-10 DIAGNOSIS — Z0000 Encounter for general adult medical examination without abnormal findings: Secondary | ICD-10-CM

## 2020-03-10 LAB — HEMOGLOBIN A1C
Est. average glucose Bld gHb Est-mCnc: 154 mg/dL
Hgb A1c MFr Bld: 7 % — ABNORMAL HIGH (ref 4.8–5.6)

## 2020-03-10 LAB — CBC WITH DIFFERENTIAL/PLATELET
Basophils Absolute: 0 10*3/uL (ref 0.0–0.2)
Basos: 0 %
EOS (ABSOLUTE): 0 10*3/uL (ref 0.0–0.4)
Eos: 0 %
Hematocrit: 28.4 % — ABNORMAL LOW (ref 34.0–46.6)
Hemoglobin: 9.2 g/dL — ABNORMAL LOW (ref 11.1–15.9)
Immature Grans (Abs): 0 10*3/uL (ref 0.0–0.1)
Immature Granulocytes: 1 %
Lymphocytes Absolute: 3.2 10*3/uL — ABNORMAL HIGH (ref 0.7–3.1)
Lymphs: 50 %
MCH: 26 pg — ABNORMAL LOW (ref 26.6–33.0)
MCHC: 32.4 g/dL (ref 31.5–35.7)
MCV: 80 fL (ref 79–97)
Monocytes Absolute: 0.5 10*3/uL (ref 0.1–0.9)
Monocytes: 7 %
Neutrophils Absolute: 2.7 10*3/uL (ref 1.4–7.0)
Neutrophils: 42 %
Platelets: 117 10*3/uL — ABNORMAL LOW (ref 150–450)
RBC: 3.54 x10E6/uL — ABNORMAL LOW (ref 3.77–5.28)
RDW: 15.2 % (ref 11.7–15.4)
WBC: 6.4 10*3/uL (ref 3.4–10.8)

## 2020-03-10 NOTE — Telephone Encounter (Signed)
Peggy calling back. Please advise.  Unable to reach assistance.

## 2020-03-10 NOTE — Patient Instructions (Signed)
Gabriela [name] , Thank you for taking time to come for your Medicare Wellness Visit. I appreciate your ongoing commitment to your health goals. Please review the following plan we discussed and let me know if I can assist you in the future.   Screening recommendations/referrals: Colonoscopy: No longer required.  Mammogram: No longer required.  Bone Density: Currently due, declined order today.  Recommended yearly ophthalmology/optometry visit for glaucoma screening and checkup Recommended yearly dental visit for hygiene and checkup  Vaccinations: Influenza vaccine: Due fall 2021 Pneumococcal vaccine: Completed series Tdap vaccine: Up to date, due 02/2023 Shingles vaccine: Shingrix discussed. Please contact your pharmacy for coverage information.     Advanced directives: Advance directive discussed with you today. Even though you declined this today please call our office should you change your mind and we can give you the proper paperwork for you to fill out.  Conditions/risks identified: Recommend completing a yearly diabetic eye exam. Pt to schedule an apt with Dr Gloriann Loan this year.   Next appointment: 07/13/20 @ 11:00 AM with Dr Rosanna Randy    Preventive Care 65 Years and Older, Female Preventive care refers to lifestyle choices and visits with your health care provider that can promote health and wellness. What does preventive care include?  A yearly physical exam. This is also called an annual well check.  Dental exams once or twice a year.  Routine eye exams. Ask your health care provider how often you should have your eyes checked.  Personal lifestyle choices, including:  Daily care of your teeth and gums.  Regular physical activity.  Eating a healthy diet.  Avoiding tobacco and drug use.  Limiting alcohol use.  Practicing safe sex.  Taking low-dose aspirin every day.  Taking vitamin and mineral supplements as recommended by your health care provider. What happens during  an annual well check? The services and screenings done by your health care provider during your annual well check will depend on your age, overall health, lifestyle risk factors, and family history of disease. Counseling  Your health care provider may ask you questions about your:  Alcohol use.  Tobacco use.  Drug use.  Emotional well-being.  Home and relationship well-being.  Sexual activity.  Eating habits.  History of falls.  Memory and ability to understand (cognition).  Work and work Statistician.  Reproductive health. Screening  You may have the following tests or measurements:  Height, weight, and BMI.  Blood pressure.  Lipid and cholesterol levels. These may be checked every 5 years, or more frequently if you are over 45 years old.  Skin check.  Lung cancer screening. You may have this screening every year starting at age 60 if you have a 30-pack-year history of smoking and currently smoke or have quit within the past 15 years.  Fecal occult blood test (FOBT) of the stool. You may have this test every year starting at age 43.  Flexible sigmoidoscopy or colonoscopy. You may have a sigmoidoscopy every 5 years or a colonoscopy every 10 years starting at age 57.  Hepatitis C blood test.  Hepatitis B blood test.  Sexually transmitted disease (STD) testing.  Diabetes screening. This is done by checking your blood sugar (glucose) after you have not eaten for a while (fasting). You may have this done every 1-3 years.  Bone density scan. This is done to screen for osteoporosis. You may have this done starting at age 54.  Mammogram. This may be done every 1-2 years. Talk to your health care  provider about how often you should have regular mammograms. Talk with your health care provider about your test results, treatment options, and if necessary, the need for more tests. Vaccines  Your health care provider may recommend certain vaccines, such as:  Influenza  vaccine. This is recommended every year.  Tetanus, diphtheria, and acellular pertussis (Tdap, Td) vaccine. You may need a Td booster every 10 years.  Zoster vaccine. You may need this after age 67.  Pneumococcal 13-valent conjugate (PCV13) vaccine. One dose is recommended after age 65.  Pneumococcal polysaccharide (PPSV23) vaccine. One dose is recommended after age 32. Talk to your health care provider about which screenings and vaccines you need and how often you need them. This information is not intended to replace advice given to you by your health care provider. Make sure you discuss any questions you have with your health care provider. Document Released: 08/28/2015 Document Revised: 04/20/2016 Document Reviewed: 06/02/2015 Elsevier Interactive Patient Education  2017 [place] Prevention in the Home Falls can cause injuries. They can happen to people of all ages. There are many things you can do to make your home safe and to help prevent falls. What can I do on the outside of my home?  Regularly fix the edges of walkways and driveways and fix any cracks.  Remove anything that might make you trip as you walk through a door, such as a raised step or threshold.  Trim any bushes or trees on the path to your home.  Use bright outdoor lighting.  Clear any walking paths of anything that might make someone trip, such as rocks or tools.  Regularly check to see if handrails are loose or broken. Make sure that both sides of any steps have handrails.  Any raised decks and porches should have guardrails on the edges.  Have any leaves, snow, or ice cleared regularly.  Use sand or salt on walking paths during winter.  Clean up any spills in your garage right away. This includes oil or grease spills. What can I do in the bathroom?  Use night lights.  Install grab bars by the toilet and in the tub and shower. Do not use towel bars as grab bars.  Use non-skid mats or decals  in the tub or shower.  If you need to sit down in the shower, use a plastic, non-slip stool.  Keep the floor dry. Clean up any water that spills on the floor as soon as it happens.  Remove soap buildup in the tub or shower regularly.  Attach bath mats securely with double-sided non-slip rug tape.  Do not have throw rugs and other things on the floor that can make you trip. What can I do in the bedroom?  Use night lights.  Make sure that you have a light by your bed that is easy to reach.  Do not use any sheets or blankets that are too big for your bed. They should not hang down onto the floor.  Have a firm chair that has side arms. You can use this for support while you get dressed.  Do not have throw rugs and other things on the floor that can make you trip. What can I do in the kitchen?  Clean up any spills right away.  Avoid walking on wet floors.  Keep items that you use a lot in easy-to-reach places.  If you need to reach something above you, use a strong step stool that has a grab bar.  Keep electrical cords out of the way.  Do not use floor polish or wax that makes floors slippery. If you must use wax, use non-skid floor wax.  Do not have throw rugs and other things on the floor that can make you trip. What can I do with my stairs?  Do not leave any items on the stairs.  Make sure that there are handrails on both sides of the stairs and use them. Fix handrails that are broken or loose. Make sure that handrails are as long as the stairways.  Check any carpeting to make sure that it is firmly attached to the stairs. Fix any carpet that is loose or worn.  Avoid having throw rugs at the top or bottom of the stairs. If you do have throw rugs, attach them to the floor with carpet tape.  Make sure that you have a light switch at the top of the stairs and the bottom of the stairs. If you do not have them, ask someone to add them for you. What else can I do to help  prevent falls?  Wear shoes that:  Do not have high heels.  Have rubber bottoms.  Are comfortable and fit you well.  Are closed at the toe. Do not wear sandals.  If you use a stepladder:  Make sure that it is fully opened. Do not climb a closed stepladder.  Make sure that both sides of the stepladder are locked into place.  Ask someone to hold it for you, if possible.  Clearly mark and make sure that you can see:  Any grab bars or handrails.  First and last steps.  Where the edge of each step is.  Use tools that help you move around (mobility aids) if they are needed. These include:  Canes.  Walkers.  Scooters.  Crutches.  Turn on the lights when you go into a dark area. Replace any light bulbs as soon as they burn out.  Set up your furniture so you have a clear path. Avoid moving your furniture around.  If any of your floors are uneven, fix them.  If there are any pets around you, be aware of where they are.  Review your medicines with your doctor. Some medicines can make you feel dizzy. This can increase your chance of falling. Ask your doctor what other things that you can do to help prevent falls. This information is not intended to replace advice given to you by your health care provider. Make sure you discuss any questions you have with your health care provider. Document Released: 05/28/2009 Document Revised: 01/07/2016 Document Reviewed: 09/05/2014 Elsevier Interactive Patient Education  2017 [employer].

## 2020-03-11 ENCOUNTER — Telehealth: Payer: Self-pay

## 2020-03-11 DIAGNOSIS — E1159 Type 2 diabetes mellitus with other circulatory complications: Secondary | ICD-10-CM

## 2020-03-11 DIAGNOSIS — D649 Anemia, unspecified: Secondary | ICD-10-CM

## 2020-03-11 MED ORDER — IRON (FERROUS SULFATE) 325 (65 FE) MG PO TABS: ORAL_TABLET | ORAL | 3 refills | Status: DC

## 2020-03-11 NOTE — Telephone Encounter (Signed)
Patient's daughter peggy advised of lab results and advised that iron has been sent to patients pharmacy.

## 2020-03-11 NOTE — Telephone Encounter (Signed)
-----   Message from Jerrol Banana., MD sent at 03/10/2020 12:48 PM EDT ----- Anemia of chronic disease stable.  Can try iron every other day for this.  I think this was called in yesterday.

## 2020-03-11 NOTE — Telephone Encounter (Signed)
Gabriela [name] advised that medication was sent to pharmacy.

## 2020-03-11 NOTE — Telephone Encounter (Signed)
yes

## 2020-03-13 NOTE — Assessment & Plan Note (Signed)
Status post CABG 2014

## 2020-03-13 NOTE — Assessment & Plan Note (Signed)
Levemir 15 units daily

## 2020-03-13 NOTE — Assessment & Plan Note (Signed)
Chronic kidney disease secondary to diabetes and hypertension and subsequent anemia of chronic disease.

## 2020-03-19 ENCOUNTER — Other Ambulatory Visit: Payer: Self-pay | Admitting: Family Medicine

## 2020-03-20 ENCOUNTER — Other Ambulatory Visit: Payer: Self-pay | Admitting: Family Medicine

## 2020-06-23 ENCOUNTER — Other Ambulatory Visit: Payer: Self-pay | Admitting: Family Medicine

## 2020-06-23 DIAGNOSIS — I10 Essential (primary) hypertension: Secondary | ICD-10-CM

## 2020-07-08 NOTE — Progress Notes (Deleted)
Established patient visit   [name]   DOB: [birth year]   86 y.o. Female  MRN: [id number] Visit Date: 07/13/2020  Today's healthcare provider: Wilhemena Durie, MD   No chief complaint on file.  Subjective    HPI  Diabetes Mellitus Type II, follow-up  Lab Results  Component Value Date   HGBA1C 7.0 (H) 03/09/2020   HGBA1C 7.1 (H) 12/09/2019   HGBA1C 8.2 (A) 08/23/2018   Last seen for diabetes 4 months ago.  Management since then includes; Levemir 15 units daily. She reports {excellent/good/fair/poor:19665} compliance with treatment. She {is/is not:21021397} having side effects. {document side effects if present:1}  Home blood sugar records: {diabetes glucometry results:16657}  Episodes of hypoglycemia? {Yes/No:20286} {enter details if yes:1}   Current insulin regiment: Levemir 15 units daily. Most Recent Eye Exam: ***  --------------------------------------------------------------------------------------------------- Hypertension, follow-up  BP Readings from Last 3 Encounters:  03/09/20 (!) 158/71  12/09/19 113/68  11/26/19 123/72   Wt Readings from Last 3 Encounters:  03/09/20 123 lb 12.8 oz (56.2 kg)  12/09/19 134 lb (60.8 kg)  11/26/19 134 lb (60.8 kg)     She was last seen for hypertension 4 months ago.  BP at that visit was 158/71. Management since that visit includes; on metoprolol and HCTZ. She reports {excellent/good/fair/poor:19665} compliance with treatment. She {is/is not:9024} having side effects. {document side effects if present:1} She {is/is not:9024} exercising. She {is/is not:9024} adherent to low salt diet.   Outside blood pressures are {enter patient reported home BP, or 'not being checked':1}.  She {does/does not:200015} smoke.  Use of agents associated with hypertension: {bp agents assoc with hypertension:511::"none"}.    ---------------------------------------------------------------------------------------------------   {Show patient history (optional):23778::" "}   Medications: Outpatient Medications Prior to Visit  Medication Sig  . ACCU-CHEK AVIVA PLUS test strip USE TO TEST SUGAR TWICE A DAY  . ACCU-CHEK FASTCLIX LANCETS MISC To check blood sugars once a day. DX E11.9  . aspirin EC 81 MG tablet Take 81 mg by mouth daily.  . Blood Glucose Monitoring Suppl (ACCU-CHEK AVIVA PLUS) w/Device KIT 1 each by Does not apply route daily. Dx E11.9  . furosemide (LASIX) 20 MG tablet TAKE 1 TABLET BY MOUTH EVERY DAY  . hydrochlorothiazide (HYDRODIURIL) 25 MG tablet TAKE 1 TABLET BY MOUTH EVERY DAY  . Insulin Pen Needle (SURE COMFORT PEN NEEDLES) 31G X 5 MM MISC USE WITH INSULIN DOSES  . Iron, Ferrous Sulfate, 325 (65 Fe) MG TABS Take 1 tablet by mouth every other day.  Marland Kitchen LEVEMIR FLEXTOUCH 100 UNIT/ML FlexPen INJECT 15 UNITS INTO THE SKIN DAILY. IN THE AFTERNOON  . metoprolol tartrate (LOPRESSOR) 25 MG tablet TAKE 1/2 TABLET BY MOUTH DAILY  . timolol (TIMOPTIC) 0.5 % ophthalmic solution Place 1 drop into both eyes every morning.   No facility-administered medications prior to visit.    Review of Systems  Constitutional: Negative for appetite change, chills, fatigue and fever.  Respiratory: Negative for chest tightness and shortness of breath.   Cardiovascular: Negative for chest pain and palpitations.  Gastrointestinal: Negative for abdominal pain, nausea and vomiting.  Neurological: Negative for dizziness and weakness.    {Heme  Chem  Endocrine  Serology  Results Review (optional):23779::" "}  Objective    There were no vitals taken for this visit. {Show previous vital signs (optional):23777::" "}  Physical Exam  ***  No results found for any visits on 07/13/20.  Assessment & Plan     ***  No follow-ups on  file.      {provider attestation***:1}   Wilhemena Durie, MD  [facility] [phone] (phone) [phone] (fax)  [place]

## 2020-07-13 ENCOUNTER — Other Ambulatory Visit: Payer: Self-pay | Admitting: Family Medicine

## 2020-07-13 ENCOUNTER — Ambulatory Visit: Payer: Self-pay | Admitting: Family Medicine

## 2020-07-13 DIAGNOSIS — D649 Anemia, unspecified: Secondary | ICD-10-CM

## 2020-07-13 DIAGNOSIS — E1159 Type 2 diabetes mellitus with other circulatory complications: Secondary | ICD-10-CM

## 2020-07-25 ENCOUNTER — Other Ambulatory Visit: Payer: Self-pay | Admitting: Family Medicine

## 2020-07-25 DIAGNOSIS — I10 Essential (primary) hypertension: Secondary | ICD-10-CM

## 2020-07-28 ENCOUNTER — Encounter: Payer: Self-pay | Admitting: Family Medicine

## 2020-07-28 ENCOUNTER — Other Ambulatory Visit: Payer: Self-pay

## 2020-07-28 ENCOUNTER — Ambulatory Visit (INDEPENDENT_AMBULATORY_CARE_PROVIDER_SITE_OTHER): Payer: Medicare Other | Admitting: Family Medicine

## 2020-07-28 VITALS — BP 140/68 | HR 63 | Temp 98.4°F | Resp 16 | Wt 123.0 lb

## 2020-07-28 DIAGNOSIS — E785 Hyperlipidemia, unspecified: Secondary | ICD-10-CM

## 2020-07-28 DIAGNOSIS — Z23 Encounter for immunization: Secondary | ICD-10-CM | POA: Diagnosis not present

## 2020-07-28 DIAGNOSIS — N289 Disorder of kidney and ureter, unspecified: Secondary | ICD-10-CM | POA: Diagnosis not present

## 2020-07-28 DIAGNOSIS — E1159 Type 2 diabetes mellitus with other circulatory complications: Secondary | ICD-10-CM | POA: Diagnosis not present

## 2020-07-28 DIAGNOSIS — I10 Essential (primary) hypertension: Secondary | ICD-10-CM

## 2020-07-28 DIAGNOSIS — D649 Anemia, unspecified: Secondary | ICD-10-CM

## 2020-07-28 DIAGNOSIS — E1121 Type 2 diabetes mellitus with diabetic nephropathy: Secondary | ICD-10-CM

## 2020-07-28 LAB — POCT GLYCOSYLATED HEMOGLOBIN (HGB A1C)
Est. average glucose Bld gHb Est-mCnc: 143
Hemoglobin A1C: 6.6 % — AB (ref 4.0–5.6)

## 2020-07-28 NOTE — Patient Instructions (Addendum)
PLEASE DECREASE INSULINE FROM 15 UNITS TO 12 UNITS.  Influenza (Flu) Vaccine (Inactivated or Recombinant): What You Need to Know 1. Why get vaccinated? Influenza vaccine can prevent influenza (flu). Flu is a contagious disease that spreads around the [place] every year, usually between October and May. Anyone can get the flu, but it is more dangerous for some people. Infants and young children, people 84 years of age and older, pregnant women, and people with certain health conditions or a weakened immune system are at greatest risk of flu complications. Pneumonia, bronchitis, sinus infections and ear infections are examples of flu-related complications. If you have a medical condition, such as heart disease, cancer or diabetes, flu can make it worse. Flu can cause fever and chills, sore throat, muscle aches, fatigue, cough, headache, and runny or stuffy nose. Some people may have vomiting and diarrhea, though this is more common in children than adults. Each year thousands of people in the [place] die from flu, and many more are hospitalized. Flu vaccine prevents millions of illnesses and flu-related visits to the doctor each year. 2. Influenza vaccine CDC recommends everyone 74 months of age and older get vaccinated every flu season. Children 6 months through 27 years of age may need 2 doses during a single flu season. Everyone else needs only 1 dose each flu season. It takes about 2 weeks for protection to develop after vaccination. There are many flu viruses, and they are always changing. Each year a new flu vaccine is made to protect against three or four viruses that are likely to cause disease in the upcoming flu season. Even when the vaccine doesn't exactly match these viruses, it may still provide some protection. Influenza vaccine does not cause flu. Influenza vaccine may be given at the same time as other vaccines. 3. Talk with your health care provider Tell your vaccine  provider if the person getting the vaccine:  Has had an allergic reaction after a previous dose of influenza vaccine, or has any severe, life-threatening allergies.  Has ever had Guillain-Barr Syndrome (also called GBS). In some cases, your health care provider may decide to postpone influenza vaccination to a future visit. People with minor illnesses, such as a cold, may be vaccinated. People who are moderately or severely ill should usually wait until they recover before getting influenza vaccine. Your health care provider can give you more information. 4. Risks of a vaccine reaction  Soreness, redness, and swelling where shot is given, fever, muscle aches, and headache can happen after influenza vaccine.  There may be a very small increased risk of Guillain-Barr Syndrome (GBS) after inactivated influenza vaccine (the flu shot). Young children who get the flu shot along with pneumococcal vaccine (PCV13), and/or DTaP vaccine at the same time might be slightly more likely to have a seizure caused by fever. Tell your health care provider if a child who is getting flu vaccine has ever had a seizure. People sometimes faint after medical procedures, including vaccination. Tell your provider if you feel dizzy or have vision changes or ringing in the ears. As with any medicine, there is a very remote chance of a vaccine causing a severe allergic reaction, other serious injury, or death. 5. What if there is a serious problem? An allergic reaction could occur after the vaccinated person leaves the clinic. If you see signs of a severe allergic reaction (hives, swelling of the face and throat, difficulty breathing, a fast heartbeat, dizziness, or weakness), call 9-1-1 and get  the person to the nearest hospital. For other signs that concern you, call your health care provider. Adverse reactions should be reported to the Vaccine Adverse Event Reporting System (VAERS). Your health care provider will usually  file this report, or you can do it yourself. Visit the VAERS website at www.vaers.SamedayNews.es or call 3396941144.VAERS is only for reporting reactions, and VAERS staff do not give medical advice. 6. The National Vaccine Injury Compensation Program The [employer] Vaccine Injury Compensation Program (VICP) is a federal program that was created to compensate people who may have been injured by certain vaccines. Visit the VICP website at GoldCloset.com.ee or call 2506801554 to learn about the program and about filing a claim. There is a time limit to file a claim for compensation. 7. How can I learn more?  Ask your healthcare provider.  Call your local or state health department.  Contact the Centers for Disease Control and Prevention (CDC): ? Call (732)588-2733 (1-800-CDC-INFO) or ? Visit CDC's https://gibson.com/ Vaccine Information Statement (Interim) Inactivated Influenza Vaccine (03/29/2018) This information is not intended to replace advice given to you by your health care provider. Make sure you discuss any questions you have with your health care provider. Document Revised: 11/20/2018 Document Reviewed: 04/02/2018 Elsevier Patient Education  [place].  Diabetes Mellitus and Nutrition, Adult When you have diabetes (diabetes mellitus), it is very important to have healthy eating habits because your blood sugar (glucose) levels are greatly affected by what you eat and drink. Eating healthy foods in the appropriate amounts, at about the same times every day, can help you:  Control your blood glucose.  Lower your risk of heart disease.  Improve your blood pressure.  Reach or maintain a healthy weight. Every person with diabetes is different, and each person has different needs for a meal plan. Your health care provider may recommend that you work with a diet and nutrition specialist (dietitian) to make a meal plan that is best for you. Your meal plan may vary  depending on factors such as:  The calories you need.  The medicines you take.  Your weight.  Your blood glucose, blood pressure, and cholesterol levels.  Your activity level.  Other health conditions you have, such as heart or kidney disease. How do carbohydrates affect me? Carbohydrates, also called carbs, affect your blood glucose level more than any other type of food. Eating carbs naturally raises the amount of glucose in your blood. Carb counting is a method for keeping track of how many carbs you eat. Counting carbs is important to keep your blood glucose at a healthy level, especially if you use insulin or take certain oral diabetes medicines. It is important to know how many carbs you can safely have in each meal. This is different for every person. Your dietitian can help you calculate how many carbs you should have at each meal and for each snack. Foods that contain carbs include:  Bread, cereal, rice, pasta, and crackers.  Potatoes and corn.  Peas, beans, and lentils.  Milk and yogurt.  Fruit and juice.  Desserts, such as cakes, cookies, ice cream, and candy. How does alcohol affect me? Alcohol can cause a sudden decrease in blood glucose (hypoglycemia), especially if you use insulin or take certain oral diabetes medicines. Hypoglycemia can be a life-threatening condition. Symptoms of hypoglycemia (sleepiness, dizziness, and confusion) are similar to symptoms of having too much alcohol. If your health care provider says that alcohol is safe for you, follow these guidelines:  Limit  alcohol intake to no more than 1 drink per day for nonpregnant women and 2 drinks per day for men. One drink equals 12 oz of beer, 5 oz of wine, or 1 oz of hard liquor.  Do not drink on an empty stomach.  Keep yourself hydrated with water, diet soda, or unsweetened iced tea.  Keep in mind that regular soda, juice, and other mixers may contain a lot of sugar and must be counted as  carbs. What are tips for following this plan?  Reading food labels  Start by checking the serving size on the "Nutrition Facts" label of packaged foods and drinks. The amount of calories, carbs, fats, and other nutrients listed on the label is based on one serving of the item. Many items contain more than one serving per package.  Check the total grams (g) of carbs in one serving. You can calculate the number of servings of carbs in one serving by dividing the total carbs by 15. For example, if a food has 30 g of total carbs, it would be equal to 2 servings of carbs.  Check the number of grams (g) of saturated and trans fats in one serving. Choose foods that have low or no amount of these fats.  Check the number of milligrams (mg) of salt (sodium) in one serving. Most people should limit total sodium intake to less than 2,300 mg per day.  Always check the nutrition information of foods labeled as "low-fat" or "nonfat". These foods may be higher in added sugar or refined carbs and should be avoided.  Talk to your dietitian to identify your daily goals for nutrients listed on the label. Shopping  Avoid buying canned, premade, or processed foods. These foods tend to be high in fat, sodium, and added sugar.  Shop around the outside edge of the grocery store. This includes fresh fruits and vegetables, bulk grains, fresh meats, and fresh dairy. Cooking  Use low-heat cooking methods, such as baking, instead of high-heat cooking methods like deep frying.  Cook using healthy oils, such as olive, canola, or sunflower oil.  Avoid cooking with butter, cream, or high-fat meats. Meal planning  Eat meals and snacks regularly, preferably at the same times every day. Avoid going long periods of time without eating.  Eat foods high in fiber, such as fresh fruits, vegetables, beans, and whole grains. Talk to your dietitian about how many servings of carbs you can eat at each meal.  Eat 4-6 ounces (oz)  of lean protein each day, such as lean meat, chicken, fish, eggs, or tofu. One oz of lean protein is equal to: ? 1 oz of meat, chicken, or fish. ? 1 egg. ?  cup of tofu.  Eat some foods each day that contain healthy fats, such as avocado, nuts, seeds, and fish. Lifestyle  Check your blood glucose regularly.  Exercise regularly as told by your health care provider. This may include: ? 150 minutes of moderate-intensity or vigorous-intensity exercise each week. This could be brisk walking, biking, or water aerobics. ? Stretching and doing strength exercises, such as yoga or weightlifting, at least 2 times a week.  Take medicines as told by your health care provider.  Do not use any products that contain nicotine or tobacco, such as cigarettes and e-cigarettes. If you need help quitting, ask your health care provider.  Work with a Social worker or diabetes educator to identify strategies to manage stress and any emotional and social challenges. Questions to ask a health  care provider  Do I need to meet with a diabetes educator?  Do I need to meet with a dietitian?  What number can I call if I have questions?  When are the best times to check my blood glucose? Where to find more information:  American Diabetes Association: diabetes.org  Academy of Nutrition and Dietetics: www.eatright.[employer] of Diabetes and Digestive and Kidney Diseases (NIH): [employer].dk Summary  A healthy meal plan will help you control your blood glucose and maintain a healthy lifestyle.  Working with a diet and nutrition specialist (dietitian) can help you make a meal plan that is best for you.  Keep in mind that carbohydrates (carbs) and alcohol have immediate effects on your blood glucose levels. It is important to count carbs and to use alcohol carefully. This information is not intended to replace advice given to you by your health care provider. Make sure you discuss any questions you  have with your health care provider. Document Revised: 07/14/2017 Document Reviewed: 09/05/2016 Elsevier Patient Education  2020 [employer].

## 2020-07-28 NOTE — Progress Notes (Signed)
Established patient visit   [name]   DOB: 01/12/1926   86 y.o. Female  MRN: [id number] Visit Date: 07/28/2020  Today's healthcare provider: Wilhemena Durie, MD   Chief Complaint  Patient presents with  . Diabetes   Subjective    HPI  86 year old patient is actually doing well.  She is having no discernible hypoglycemia at this time. Patient feels well and has no complaints.  She has had no falls. Diabetes Mellitus Type II, follow-up  Lab Results  Component Value Date   HGBA1C 6.6 (A) 07/28/2020   HGBA1C 7.0 (H) 03/09/2020   HGBA1C 7.1 (H) 12/09/2019   Last seen for diabetes 5 months ago.  Management since then includes continuing the same treatment. She reports excellent compliance with treatment. She is not having side effects.   Home blood sugar records: fasting range: 120's  Episodes of hypoglycemia? No    Current insulin regiment: none Most Recent Eye Exam: UTD  --------------------------------------------------------------------------------------------------- Hypertension, follow-up  BP Readings from Last 3 Encounters:  07/28/20 140/68  03/09/20 (!) 158/71  12/09/19 113/68   Wt Readings from Last 3 Encounters:  07/28/20 123 lb (55.8 kg)  03/09/20 123 lb 12.8 oz (56.2 kg)  12/09/19 134 lb (60.8 kg)     She was last seen for hypertension 5 months ago.  BP at that visit was 158/71. Management since that visit includes; on metoprolol and HCTZ. She reports excellent compliance with treatment. She is not having side effects.  She is not exercising. She is adherent to low salt diet.   Outside blood pressures are not being checked.  She does not smoke.  Use of agents associated with hypertension: none.   ---------------------------------------------------------------------------------------------------  Patient Active Problem List   Diagnosis Date Noted  . Wound of thigh 03/30/2018  . History of revision of total  replacement of right hip joint 01/20/2016  . Abnormal LFTs (liver function tests) 04/17/2015  . Anemia of diabetes 04/17/2015  . Atherosclerosis of coronary artery 04/17/2015  . Diabetic kidney ([place]) 04/17/2015  . Essential (primary) hypertension 04/17/2015  . Glaucoma 04/17/2015  . HLD (hyperlipidemia) 04/17/2015  . Disorder of kidney 04/17/2015  . Arthritis, degenerative 04/17/2015  . Arthritis, hip 04/26/2013  . Left main coronary artery disease 04/26/2013  . Ischemic mitral regurgitation 04/26/2013  . Hypertension   . Diabetes mellitus ([place])   . Hyperlipidemia    Social History   Tobacco Use  . Smoking status: Never Smoker  . Smokeless tobacco: Never Used  Substance Use Topics  . Alcohol use: No  . Drug use: No   Allergies  Allergen Reactions  . Sulfa Antibiotics Rash       Medications: Outpatient Medications Prior to Visit  Medication Sig  . ACCU-CHEK AVIVA PLUS test strip USE TO TEST SUGAR TWICE A DAY  . ACCU-CHEK FASTCLIX LANCETS MISC To check blood sugars once a day. DX E11.9  . aspirin EC 81 MG tablet Take 81 mg by mouth daily.  . Blood Glucose Monitoring Suppl (ACCU-CHEK AVIVA PLUS) w/Device KIT 1 each by Does not apply route daily. Dx E11.9  . furosemide (LASIX) 20 MG tablet TAKE 1 TABLET BY MOUTH EVERY DAY  . hydrochlorothiazide (HYDRODIURIL) 25 MG tablet TAKE 1 TABLET BY MOUTH EVERY DAY  . Insulin Pen Needle (SURE COMFORT PEN NEEDLES) 31G X 5 MM MISC USE WITH INSULIN DOSES  . Iron, Ferrous Sulfate, 325 (65 Fe) MG TABS Take 1 tablet by mouth every other day.  [place] Kitchen  LEVEMIR FLEXTOUCH 100 UNIT/ML FlexPen INJECT 15 UNITS INTO THE SKIN DAILY. IN THE AFTERNOON  . metoprolol tartrate (LOPRESSOR) 25 MG tablet TAKE 1/2 TABLET BY MOUTH DAILY  . timolol (TIMOPTIC) 0.5 % ophthalmic solution Place 1 drop into both eyes every morning.   No facility-administered medications prior to visit.    Review of Systems  Constitutional: Negative for appetite change, chills, fatigue  and fever.  Respiratory: Negative for chest tightness and shortness of breath.   Cardiovascular: Negative for chest pain and palpitations.  Gastrointestinal: Negative for abdominal pain, nausea and vomiting.  Neurological: Negative for dizziness and weakness.    Last CBC Lab Results  Component Value Date   WBC 6.4 03/09/2020   HGB 9.2 (L) 03/09/2020   HCT 28.4 (L) 03/09/2020   MCV 80 03/09/2020   MCH 26.0 (L) 03/09/2020   RDW 15.2 03/09/2020   PLT 117 (L) 03/09/2020   Last thyroid functions Lab Results  Component Value Date   TSH 1.980 05/09/2018      Objective    BP 140/68 (BP Location: Left Arm, Patient Position: Sitting, Cuff Size: Normal)   Pulse 63   Temp 98.4 F (36.9 C) (Oral)   Resp 16   Wt 123 lb (55.8 kg)   SpO2 99%   BMI 21.11 kg/m  BP Readings from Last 3 Encounters:  07/28/20 140/68  03/09/20 (!) 158/71  12/09/19 113/68   Wt Readings from Last 3 Encounters:  07/28/20 123 lb (55.8 kg)  03/09/20 123 lb 12.8 oz (56.2 kg)  12/09/19 134 lb (60.8 kg)      Physical Exam Vitals and nursing note reviewed.  Constitutional:      Appearance: Normal appearance. She is normal weight.  HENT:     Right Ear: Tympanic membrane normal.     Left Ear: Tympanic membrane normal.     Nose: Nose normal.     Mouth/Throat:     Mouth: Mucous membranes are moist.     Pharynx: Oropharynx is clear.  Eyes:     Conjunctiva/sclera: Conjunctivae normal.  Cardiovascular:     Rate and Rhythm: Normal rate and regular rhythm.     Pulses: Normal pulses.     Heart sounds: Normal heart sounds.  Pulmonary:     Effort: Pulmonary effort is normal.     Breath sounds: Normal breath sounds.  Abdominal:     General: Bowel sounds are normal.     Palpations: Abdomen is soft.  Musculoskeletal:     Cervical back: Normal range of motion and neck supple.     Right lower leg: No edema.     Left lower leg: No edema.     Comments: She has mild tenderness over the right greater trochanter  and some tenderness along the right knee joint line.  There is no effusion.  No warmth.  Skin:    General: Skin is warm and dry.  Neurological:     General: No focal deficit present.     Mental Status: She is alert and oriented to person, place, and time.  Psychiatric:        Mood and Affect: Mood normal.        Behavior: Behavior normal.        Thought Content: Thought content normal.        Judgment: Judgment normal.       Results for orders placed or performed in visit on 07/28/20  POCT glycosylated hemoglobin (Hb A1C)  Result Value Ref Range  Hemoglobin A1C 6.6 (A) 4.0 - 5.6 %   Est. average glucose Bld gHb Est-mCnc 143     Assessment & Plan     1. Type 2 diabetes mellitus with other circulatory complication, unspecified whether long term insulin use (HCC)  A1c is 6.6 so we will decrease insulin from 15 to 12 units daily to avoid potential hypoglycemia in this 86 year old.  Patient and her daughter both understand - POCT glycosylated hemoglobin (Hb A1C)  2. Need for influenza vaccination She has had Covid vaccines also. - Flu Vaccine QUAD High Dose(Fluad)  3. Anemia, unspecified type Anemia secondary to chronic kidney disease most likely - CBC with Differential/Platelet  4. Essential (primary) hypertension Controlled - Renal Function Panel  5. Disorder of kidney  - Renal Function Panel  6. Diabetic nephropathy associated with type 2 diabetes mellitus (HCC) Avoid NSAID use.  Stay hydrated.  7. Hyperlipidemia, unspecified hyperlipidemia type    No follow-ups on file.         Journiee Feldkamp Cranford Mon, MD  [facility] [phone] (phone) [phone] (fax)  [place]

## 2020-07-29 LAB — RENAL FUNCTION PANEL
Albumin: 4.4 g/dL (ref 3.5–4.6)
BUN/Creatinine Ratio: 27 (ref 12–28)
BUN: 46 mg/dL — ABNORMAL HIGH (ref 10–36)
CO2: 20 mmol/L (ref 20–29)
Calcium: 9.1 mg/dL (ref 8.7–10.3)
Chloride: 101 mmol/L (ref 96–106)
Creatinine, Ser: 1.68 mg/dL — ABNORMAL HIGH (ref 0.57–1.00)
GFR calc Af Amer: 30 mL/min/{1.73_m2} — ABNORMAL LOW (ref >59)
GFR calc non Af Amer: 26 mL/min/{1.73_m2} — ABNORMAL LOW (ref >59)
Glucose: 112 mg/dL — ABNORMAL HIGH (ref 65–99)
Phosphorus: 4.1 mg/dL (ref 3.0–4.3)
Potassium: 4.6 mmol/L (ref 3.5–5.2)
Sodium: 137 mmol/L (ref 134–144)

## 2020-07-29 LAB — CBC WITH DIFFERENTIAL/PLATELET
Basophils Absolute: 0 10*3/uL (ref 0.0–0.2)
Basos: 1 %
EOS (ABSOLUTE): 0 10*3/uL (ref 0.0–0.4)
Eos: 0 %
Hematocrit: 28.2 % — ABNORMAL LOW (ref 34.0–46.6)
Hemoglobin: 9 g/dL — ABNORMAL LOW (ref 11.1–15.9)
Immature Grans (Abs): 0 10*3/uL (ref 0.0–0.1)
Immature Granulocytes: 1 %
Lymphocytes Absolute: 2.8 10*3/uL (ref 0.7–3.1)
Lymphs: 46 %
MCH: 25.3 pg — ABNORMAL LOW (ref 26.6–33.0)
MCHC: 31.9 g/dL (ref 31.5–35.7)
MCV: 79 fL (ref 79–97)
Monocytes Absolute: 0.6 10*3/uL (ref 0.1–0.9)
Monocytes: 9 %
Neutrophils Absolute: 2.7 10*3/uL (ref 1.4–7.0)
Neutrophils: 43 %
Platelets: 95 10*3/uL — CL (ref 150–450)
RBC: 3.56 x10E6/uL — ABNORMAL LOW (ref 3.77–5.28)
RDW: 15.5 % — ABNORMAL HIGH (ref 11.7–15.4)
WBC: 6.1 10*3/uL (ref 3.4–10.8)

## 2020-07-30 ENCOUNTER — Other Ambulatory Visit: Payer: Self-pay | Admitting: Family Medicine

## 2020-07-30 DIAGNOSIS — D649 Anemia, unspecified: Secondary | ICD-10-CM

## 2020-07-30 DIAGNOSIS — E1159 Type 2 diabetes mellitus with other circulatory complications: Secondary | ICD-10-CM

## 2020-07-30 NOTE — Telephone Encounter (Signed)
Please review PEC triage Notes to clinic:  Patient had labs 07/28/20. Not addressed by Dr Rosanna Randy. I have no indication from office note what medications he wants to continue. New information so I'll let office be advised by Dr Rosanna Randy as to dosage, continuation etc.

## 2020-07-30 NOTE — Telephone Encounter (Signed)
Requested medication (s) are due for refill today: yes  Requested medication (s) are on the active medication list: yes  Last refill:  levemir #15  0 refills 06/23/20, Iron #30 3 refills 03/11/20, HCTZ #90 1 refill 03/20/20  Future visit scheduled:yes  Notes to clinic:  Patient had labs 07/28/20. Not addressed by Dr Rosanna Randy. I have no indication from office note what medications he wants to continue. New information so I'll let office be advised by Dr Rosanna Randy as to dosage, continuation etc.    Requested Prescriptions  Pending Prescriptions Disp Refills   LEVEMIR FLEXTOUCH 100 UNIT/ML FlexPen [Pharmacy Med Name: LEVEMIR FLEXTOUCH 100 UNIT/ML] 15 mL     Sig: INJECT 15 UNITS INTO THE SKIN DAILY. IN THE AFTERNOON      Endocrinology:  Diabetes - Insulins Passed - 07/30/2020 10:42 AM      Passed - HBA1C is between 0 and 7.9 and within 180 days    Hemoglobin A1C  Date Value Ref Range Status  07/28/2020 6.6 (A) 4.0 - 5.6 % Final   Hgb A1c MFr Bld  Date Value Ref Range Status  03/09/2020 7.0 (H) 4.8 - 5.6 % Final    Comment:             Prediabetes: 5.7 - 6.4          Diabetes: >6.4          Glycemic control for adults with diabetes: <7.0           Passed - Valid encounter within last 6 months    Recent Outpatient Visits           2 days ago Type 2 diabetes mellitus with other circulatory complication, unspecified whether long term insulin use ([place])   [facility] Jerrol Banana., MD   4 months ago Type 2 diabetes mellitus with other circulatory complication, unspecified whether long term insulin use ([place])   [facility] Jerrol Banana., MD   7 months ago Type 2 diabetes mellitus with other circulatory complication, unspecified whether long term insulin use ([place])   [facility] Jerrol Banana., MD   10 months ago Diabetic nephropathy associated with type 2 diabetes mellitus [facility])   [facility] Jerrol Banana., MD   1 year ago Type 2 diabetes mellitus with complication, with long-term current use of insulin [facility])   [facility] Jerrol Banana., MD       Future Appointments             In 3 months Jerrol Banana., MD [facility]               ferrous sulfate 325 (65 FE) MG tablet [Pharmacy Med Name: FERROUS SULFATE 325 MG TABLET] 45 tablet 2    Sig: TAKE 1 [place]      Endocrinology:  Minerals - Iron Supplementation Failed - 07/30/2020 10:42 AM      Failed - HGB in normal range and within 360 days    Hemoglobin  Date Value Ref Range Status  07/28/2020 9.0 (L) 11.1 - 15.9 g/dL Final   Total hemoglobin  Date Value Ref Range Status  06/03/2013 8.4 (L) 12.0 - 16.0 g/dL Final          Failed - HCT in normal range and within 360 days    Hematocrit  Date Value Ref Range Status  07/28/2020 28.2 (  L) 34.0 - 46.6 % Final          Failed - RBC in normal range and within 360 days    RBC  Date Value Ref Range Status  07/28/2020 3.56 (L) 3.77 - 5.28 x10E6/uL Final  05/19/2018 3.15 (L) 3.80 - 5.20 MIL/uL Final          Failed - Fe (serum) in normal range and within 360 days    No results found for: IRON, IRONPCTSAT        Failed - Ferritin in normal range and within 360 days    No results found for: FERRITIN        Passed - Valid encounter within last 12 months    Recent Outpatient Visits           2 days ago Type 2 diabetes mellitus with other circulatory complication, unspecified whether long term insulin use [facility])   [facility] Jerrol Banana., MD   4 months ago Type 2 diabetes mellitus with other circulatory complication, unspecified whether long term insulin use ([place])   [facility] Jerrol Banana., MD   7 months ago Type 2 diabetes mellitus with other circulatory complication, unspecified whether long term insulin use [facility])   [facility] Jerrol Banana., MD   10 months ago Diabetic nephropathy associated with type 2 diabetes mellitus [facility])   [facility] Jerrol Banana., MD   1 year ago Type 2 diabetes mellitus with complication, with long-term current use of insulin [facility])   [facility] Jerrol Banana., MD       Future Appointments             In 3 months Jerrol Banana., MD [facility]               hydrochlorothiazide (HYDRODIURIL) 25 MG tablet [Pharmacy Med Name: HYDROCHLOROTHIAZIDE 25 MG TAB] 90 tablet 1    Sig: TAKE 1 TABLET BY MOUTH EVERY DAY      Cardiovascular: Diuretics - Thiazide Failed - 07/30/2020 10:42 AM      Failed - Cr in normal range and within 360 days    Creat  Date Value Ref Range Status  06/13/2017 1.33 (H) 0.60 - 0.88 mg/dL Final    Comment:    For patients >93 years of age, the reference limit for Creatinine is approximately 13% higher for people identified as African-American. .    Creatinine, Ser  Date Value Ref Range Status  07/28/2020 1.68 (H) 0.57 - 1.00 mg/dL Final          Failed - Last BP in normal range    BP Readings from Last 1 Encounters:  07/28/20 140/68          Passed - Ca in normal range and within 360 days    Calcium  Date Value Ref Range Status  07/28/2020 9.1 8.7 - 10.3 mg/dL Final   Calcium, Ion  Date Value Ref Range Status  06/01/2013 1.28 1.13 - 1.30 mmol/L Final          Passed - K in normal range and within 360 days    Potassium  Date Value Ref Range Status  07/28/2020 4.6 3.5 - 5.2 mmol/L Final          Passed - Na in normal range and within 360 days    Sodium  Date Value Ref Range Status  07/28/2020 137 134 -  144 mmol/L Final          Passed - Valid encounter within last 6 months    Recent Outpatient Visits           2 days ago Type 2 diabetes mellitus with other circulatory complication, unspecified whether long term insulin use [facility])    [facility] Jerrol Banana., MD   4 months ago Type 2 diabetes mellitus with other circulatory complication, unspecified whether long term insulin use [facility])   [facility] Jerrol Banana., MD   7 months ago Type 2 diabetes mellitus with other circulatory complication, unspecified whether long term insulin use [facility])   [facility] Jerrol Banana., MD   10 months ago Diabetic nephropathy associated with type 2 diabetes mellitus [facility])   [facility] Jerrol Banana., MD   1 year ago Type 2 diabetes mellitus with complication, with long-term current use of insulin [facility])   [facility] Jerrol Banana., MD       Future Appointments             In 3 months Jerrol Banana., MD [facility]

## 2020-08-18 ENCOUNTER — Other Ambulatory Visit: Payer: Self-pay | Admitting: Family Medicine

## 2020-08-22 ENCOUNTER — Other Ambulatory Visit: Payer: Medicare Other

## 2020-08-22 DIAGNOSIS — Z20822 Contact with and (suspected) exposure to covid-19: Secondary | ICD-10-CM

## 2020-08-24 DIAGNOSIS — Z20822 Contact with and (suspected) exposure to covid-19: Secondary | ICD-10-CM | POA: Diagnosis not present

## 2020-08-25 ENCOUNTER — Other Ambulatory Visit: Payer: Self-pay | Admitting: Family Medicine

## 2020-08-25 DIAGNOSIS — I10 Essential (primary) hypertension: Secondary | ICD-10-CM

## 2020-08-26 LAB — NOVEL CORONAVIRUS, NAA: SARS-CoV-2, NAA: NOT DETECTED

## 2020-08-26 LAB — SARS-COV-2, NAA 2 DAY TAT

## 2020-09-10 ENCOUNTER — Other Ambulatory Visit: Payer: Medicare Other

## 2020-09-11 ENCOUNTER — Other Ambulatory Visit: Payer: Self-pay

## 2020-09-11 ENCOUNTER — Other Ambulatory Visit: Payer: Medicare Other

## 2020-09-11 DIAGNOSIS — Z20822 Contact with and (suspected) exposure to covid-19: Secondary | ICD-10-CM | POA: Diagnosis not present

## 2020-09-12 ENCOUNTER — Other Ambulatory Visit: Payer: Medicare Other

## 2020-09-12 LAB — SARS-COV-2, NAA 2 DAY TAT

## 2020-09-12 LAB — NOVEL CORONAVIRUS, NAA: SARS-CoV-2, NAA: NOT DETECTED

## 2020-10-01 DIAGNOSIS — H40153 Residual stage of open-angle glaucoma, bilateral: Secondary | ICD-10-CM | POA: Diagnosis not present

## 2020-10-06 ENCOUNTER — Telehealth: Payer: Self-pay

## 2020-10-06 ENCOUNTER — Other Ambulatory Visit: Payer: Self-pay | Admitting: Family Medicine

## 2020-10-06 MED ORDER — HYDROCHLOROTHIAZIDE 25 MG PO TABS: 25.0000 mg | ORAL_TABLET | Freq: Every day | ORAL | 1 refills | Status: DC

## 2020-10-06 NOTE — Telephone Encounter (Signed)
Patient's granddaughter was advised that patient will need to sigh DPR in person in the office.

## 2020-10-06 NOTE — Telephone Encounter (Signed)
Medication Refill - Medication: hydrochlorothiazide (HYDRODIURIL) 25 MG tablet (patient is out)   Has the patient contacted their pharmacy? Yes.    (Agent: If yes, when and what did the pharmacy advise?) Pharmacy advised no refills   Preferred Pharmacy (with phone number or street name):  [id number] [place], [place] - [address] Phone:  [phone]  Fax:  [phone]       Agent: Please be advised that RX refills may take up to 3 business days. We ask that you follow-up with your pharmacy.

## 2020-10-06 NOTE — Telephone Encounter (Signed)
Copied from [place] [phone]. Topic: General - Other >> Oct 06, 2020  9:16 AM Oneta Rack wrote: Ailene Ravel grad daughter would like PCP to know patient daughter Eldred Manges passed away therefore grand daughter Ailene Ravel and [name] will be the new point of contact. Advised caller about DPR form, caller would like to know if form can be picked up and patient will sign and will return due to patient not being able to come in due to her being 37 if not necessary, please advise [name] at

## 2020-10-18 ENCOUNTER — Inpatient Hospital Stay
Admission: EM | Admit: 2020-10-18 | Discharge: 2020-10-22 | DRG: 871 | Disposition: A | Discharge status: Home Health | Payer: Medicare Other | Attending: Internal Medicine | Admitting: Internal Medicine

## 2020-10-18 ENCOUNTER — Emergency Department: Payer: Medicare Other

## 2020-10-18 DIAGNOSIS — I129 Hypertensive chronic kidney disease with stage 1 through stage 4 chronic kidney disease, or unspecified chronic kidney disease: Secondary | ICD-10-CM | POA: Diagnosis not present

## 2020-10-18 DIAGNOSIS — K229 Disease of esophagus, unspecified: Secondary | ICD-10-CM | POA: Diagnosis not present

## 2020-10-18 DIAGNOSIS — R5381 Other malaise: Secondary | ICD-10-CM | POA: Diagnosis not present

## 2020-10-18 DIAGNOSIS — K8030 Calculus of bile duct with cholangitis, unspecified, without obstruction: Secondary | ICD-10-CM | POA: Diagnosis present

## 2020-10-18 DIAGNOSIS — D638 Anemia in other chronic diseases classified elsewhere: Secondary | ICD-10-CM | POA: Diagnosis not present

## 2020-10-18 DIAGNOSIS — I2510 Atherosclerotic heart disease of native coronary artery without angina pectoris: Secondary | ICD-10-CM | POA: Diagnosis present

## 2020-10-18 DIAGNOSIS — N183 Chronic kidney disease, stage 3 unspecified: Secondary | ICD-10-CM | POA: Diagnosis not present

## 2020-10-18 DIAGNOSIS — D631 Anemia in chronic kidney disease: Secondary | ICD-10-CM

## 2020-10-18 DIAGNOSIS — I509 Heart failure, unspecified: Secondary | ICD-10-CM | POA: Diagnosis not present

## 2020-10-18 DIAGNOSIS — R1110 Vomiting, unspecified: Secondary | ICD-10-CM | POA: Diagnosis not present

## 2020-10-18 DIAGNOSIS — R932 Abnormal findings on diagnostic imaging of liver and biliary tract: Secondary | ICD-10-CM | POA: Diagnosis not present

## 2020-10-18 DIAGNOSIS — Z833 Family history of diabetes mellitus: Secondary | ICD-10-CM | POA: Diagnosis not present

## 2020-10-18 DIAGNOSIS — E119 Type 2 diabetes mellitus without complications: Secondary | ICD-10-CM | POA: Diagnosis not present

## 2020-10-18 DIAGNOSIS — R0602 Shortness of breath: Secondary | ICD-10-CM

## 2020-10-18 DIAGNOSIS — Z96641 Presence of right artificial hip joint: Secondary | ICD-10-CM | POA: Diagnosis not present

## 2020-10-18 DIAGNOSIS — Z743 Need for continuous supervision: Secondary | ICD-10-CM | POA: Diagnosis not present

## 2020-10-18 DIAGNOSIS — K838 Other specified diseases of biliary tract: Secondary | ICD-10-CM | POA: Diagnosis not present

## 2020-10-18 DIAGNOSIS — R6521 Severe sepsis with septic shock: Secondary | ICD-10-CM | POA: Diagnosis present

## 2020-10-18 DIAGNOSIS — N179 Acute kidney failure, unspecified: Secondary | ICD-10-CM | POA: Diagnosis not present

## 2020-10-18 DIAGNOSIS — M25451 Effusion, right hip: Secondary | ICD-10-CM | POA: Diagnosis present

## 2020-10-18 DIAGNOSIS — K449 Diaphragmatic hernia without obstruction or gangrene: Secondary | ICD-10-CM | POA: Diagnosis present

## 2020-10-18 DIAGNOSIS — R6889 Other general symptoms and signs: Secondary | ICD-10-CM | POA: Diagnosis not present

## 2020-10-18 DIAGNOSIS — N189 Chronic kidney disease, unspecified: Secondary | ICD-10-CM

## 2020-10-18 DIAGNOSIS — R7989 Other specified abnormal findings of blood chemistry: Secondary | ICD-10-CM | POA: Diagnosis not present

## 2020-10-18 DIAGNOSIS — H409 Unspecified glaucoma: Secondary | ICD-10-CM | POA: Diagnosis present

## 2020-10-18 DIAGNOSIS — Z7982 Long term (current) use of aspirin: Secondary | ICD-10-CM

## 2020-10-18 DIAGNOSIS — D509 Iron deficiency anemia, unspecified: Secondary | ICD-10-CM | POA: Diagnosis present

## 2020-10-18 DIAGNOSIS — R1111 Vomiting without nausea: Secondary | ICD-10-CM | POA: Diagnosis not present

## 2020-10-18 DIAGNOSIS — Z20822 Contact with and (suspected) exposure to covid-19: Secondary | ICD-10-CM | POA: Diagnosis not present

## 2020-10-18 DIAGNOSIS — Z951 Presence of aortocoronary bypass graft: Secondary | ICD-10-CM | POA: Diagnosis not present

## 2020-10-18 DIAGNOSIS — A419 Sepsis, unspecified organism: Secondary | ICD-10-CM | POA: Diagnosis present

## 2020-10-18 DIAGNOSIS — K8050 Calculus of bile duct without cholangitis or cholecystitis without obstruction: Secondary | ICD-10-CM | POA: Diagnosis present

## 2020-10-18 DIAGNOSIS — R0902 Hypoxemia: Secondary | ICD-10-CM | POA: Diagnosis not present

## 2020-10-18 DIAGNOSIS — Z9049 Acquired absence of other specified parts of digestive tract: Secondary | ICD-10-CM | POA: Diagnosis not present

## 2020-10-18 DIAGNOSIS — Z882 Allergy status to sulfonamides status: Secondary | ICD-10-CM

## 2020-10-18 DIAGNOSIS — Z79899 Other long term (current) drug therapy: Secondary | ICD-10-CM

## 2020-10-18 DIAGNOSIS — K851 Biliary acute pancreatitis without necrosis or infection: Secondary | ICD-10-CM | POA: Diagnosis present

## 2020-10-18 DIAGNOSIS — R109 Unspecified abdominal pain: Secondary | ICD-10-CM | POA: Diagnosis not present

## 2020-10-18 DIAGNOSIS — E1122 Type 2 diabetes mellitus with diabetic chronic kidney disease: Secondary | ICD-10-CM | POA: Diagnosis present

## 2020-10-18 DIAGNOSIS — I700 Atherosclerosis of aorta: Secondary | ICD-10-CM | POA: Diagnosis present

## 2020-10-18 DIAGNOSIS — K8309 Other cholangitis: Secondary | ICD-10-CM | POA: Diagnosis not present

## 2020-10-18 DIAGNOSIS — E785 Hyperlipidemia, unspecified: Secondary | ICD-10-CM | POA: Diagnosis not present

## 2020-10-18 DIAGNOSIS — Z794 Long term (current) use of insulin: Secondary | ICD-10-CM

## 2020-10-18 DIAGNOSIS — I10 Essential (primary) hypertension: Secondary | ICD-10-CM | POA: Diagnosis not present

## 2020-10-18 LAB — CBC
HCT: 26.1 % — ABNORMAL LOW (ref 36.0–46.0)
Hemoglobin: 8.2 g/dL — ABNORMAL LOW (ref 12.0–15.0)
MCH: 25.5 pg — ABNORMAL LOW (ref 26.0–34.0)
MCHC: 31.4 g/dL (ref 30.0–36.0)
MCV: 81.3 fL (ref 80.0–100.0)
Platelets: 105 10*3/uL — ABNORMAL LOW (ref 150–400)
RBC: 3.21 MIL/uL — ABNORMAL LOW (ref 3.87–5.11)
RDW: 17.3 % — ABNORMAL HIGH (ref 11.5–15.5)
WBC: 7.5 10*3/uL (ref 4.0–10.5)
nRBC: 0 % (ref 0.0–0.2)

## 2020-10-18 LAB — COMPREHENSIVE METABOLIC PANEL
ALT: 163 U/L — ABNORMAL HIGH (ref 0–44)
AST: 456 U/L — ABNORMAL HIGH (ref 15–41)
Albumin: 4 g/dL (ref 3.5–5.0)
Alkaline Phosphatase: 527 U/L — ABNORMAL HIGH (ref 38–126)
Anion gap: 13 (ref 5–15)
BUN: 56 mg/dL — ABNORMAL HIGH (ref 8–23)
CO2: 21 mmol/L — ABNORMAL LOW (ref 22–32)
Calcium: 8.9 mg/dL (ref 8.9–10.3)
Chloride: 105 mmol/L (ref 98–111)
Creatinine, Ser: 1.72 mg/dL — ABNORMAL HIGH (ref 0.44–1.00)
GFR, Estimated: 28 mL/min — ABNORMAL LOW (ref >60)
Glucose, Bld: 168 mg/dL — ABNORMAL HIGH (ref 70–99)
Potassium: 4.1 mmol/L (ref 3.5–5.1)
Sodium: 139 mmol/L (ref 135–145)
Total Bilirubin: 1.4 mg/dL — ABNORMAL HIGH (ref 0.3–1.2)
Total Protein: 8.8 g/dL — ABNORMAL HIGH (ref 6.5–8.1)

## 2020-10-18 LAB — LIPASE, BLOOD: Lipase: 97 U/L — ABNORMAL HIGH (ref 11–51)

## 2020-10-18 MED ORDER — SODIUM CHLORIDE 0.9 % IV SOLN: INTRAVENOUS | Status: DC

## 2020-10-18 MED ORDER — ONDANSETRON HCL 4 MG PO TABS: 4.0000 mg | ORAL_TABLET | Freq: Four times a day (QID) | ORAL | Status: DC | PRN

## 2020-10-18 MED ORDER — ONDANSETRON HCL 4 MG/2ML IJ SOLN
4.0000 mg | Freq: Once | INTRAMUSCULAR | Status: AC
  Administered 2020-10-18: 4 mg via INTRAVENOUS
  Filled 2020-10-18: qty 2

## 2020-10-18 MED ORDER — ALBUTEROL SULFATE (2.5 MG/3ML) 0.083% IN NEBU: 2.5000 mg | INHALATION_SOLUTION | RESPIRATORY_TRACT | Status: DC | PRN

## 2020-10-18 MED ORDER — ONDANSETRON HCL 4 MG/2ML IJ SOLN: 4.0000 mg | Freq: Four times a day (QID) | INTRAMUSCULAR | Status: DC | PRN

## 2020-10-18 MED ORDER — OXYCODONE HCL 5 MG PO TABS: 2.5000 mg | ORAL_TABLET | Freq: Four times a day (QID) | ORAL | Status: DC | PRN

## 2020-10-18 MED ORDER — IOHEXOL 300 MG/ML  SOLN
75.0000 mL | Freq: Once | INTRAMUSCULAR | Status: AC | PRN
  Administered 2020-10-18: 75 mL via INTRAVENOUS

## 2020-10-18 MED ORDER — HEPARIN SODIUM (PORCINE) 5000 UNIT/ML IJ SOLN
5000.0000 [IU] | Freq: Three times a day (TID) | INTRAMUSCULAR | Status: DC
  Administered 2020-10-19 – 2020-10-22 (×9): 5000 [IU] via SUBCUTANEOUS
  Filled 2020-10-18 (×10): qty 1

## 2020-10-18 MED ORDER — PANTOPRAZOLE SODIUM 40 MG IV SOLR
40.0000 mg | INTRAVENOUS | Status: DC
  Administered 2020-10-19 – 2020-10-20 (×2): 40 mg via INTRAVENOUS
  Filled 2020-10-18 (×2): qty 40

## 2020-10-18 MED ORDER — MORPHINE SULFATE (PF) 2 MG/ML IV SOLN: 1.0000 mg | INTRAVENOUS | Status: DC | PRN

## 2020-10-18 MED ORDER — SODIUM CHLORIDE 0.9 % IV BOLUS
500.0000 mL | Freq: Once | INTRAVENOUS | Status: AC
  Administered 2020-10-18: 500 mL via INTRAVENOUS

## 2020-10-18 NOTE — ED Provider Notes (Addendum)
[facility] Emergency Department Provider Note   ____________________________________________    I have reviewed the triage vital signs and the nursing notes.   HISTORY  Chief Complaint Vomiting (X 2 days , vomiting x 3 episodes )     HPI Gabriela [name] is a 87 y.o. female who presents with nausea and vomiting.  Patient presents via EMS, reportedly has had nausea vomiting over the last 2 days.  Does describe some periumbilical abdominal pain which only started recently.  No reports of fevers or chills.  Denies dysuria.  Has not taken anything for this.  Family members are apparently also feeling ill.  Past Medical History:  Diagnosis Date  . Arthritis   . Diabetes mellitus ([place])   . Glaucoma   . Hyperlipidemia   . Hypertension   . Shortness of breath    with exertion    Patient Active Problem List   Diagnosis Date Noted  . Wound of thigh 03/30/2018  . History of revision of total replacement of right hip joint 01/20/2016  . Abnormal LFTs (liver function tests) 04/17/2015  . Anemia of diabetes 04/17/2015  . Atherosclerosis of coronary artery 04/17/2015  . Diabetic kidney ([place]) 04/17/2015  . Essential (primary) hypertension 04/17/2015  . Glaucoma 04/17/2015  . HLD (hyperlipidemia) 04/17/2015  . Disorder of kidney 04/17/2015  . Arthritis, degenerative 04/17/2015  . Arthritis, hip 04/26/2013  . Left main coronary artery disease 04/26/2013  . Ischemic mitral regurgitation 04/26/2013  . Hypertension   . Diabetes mellitus ([place])   . Hyperlipidemia     Past Surgical History:  Procedure Laterality Date  . CARDIAC CATHETERIZATION  04/22/13  . CHOLECYSTECTOMY    . CORONARY ARTERY BYPASS GRAFT N/A 05/31/2013   Procedure: Coronary Artery Bypass Grafting Times Three Using Left Internal Mammary Artery and Right Saphenous Leg Vein Harvested Endoscopically;  Surgeon: Ivin Poot, MD;  Location: [place];  Service: Open Heart Surgery;  Laterality:  N/A;  . EYE SURGERY    . HIP SURGERY     right  . INTRAOPERATIVE TRANSESOPHAGEAL ECHOCARDIOGRAM N/A 05/31/2013   Procedure: INTRAOPERATIVE TRANSESOPHAGEAL ECHOCARDIOGRAM;  Surgeon: Ivin Poot, MD;  Location: [place];  Service: Open Heart Surgery;  Laterality: N/A;  . JOINT REPLACEMENT    . MITRAL VALVE REPAIR N/A 05/31/2013   Procedure: MITRAL VALVE REPAIR (MVR);  Surgeon: Ivin Poot, MD;  Location: [place];  Service: Open Heart Surgery;  Laterality: N/A;  . TONSILLECTOMY      Prior to Admission medications   Medication Sig Start Date End Date Taking? Authorizing Provider  ACCU-CHEK AVIVA PLUS test strip USE TO TEST SUGAR TWICE A DAY 12/30/19   Jerrol Banana., MD  ACCU-CHEK FASTCLIX LANCETS MISC To check blood sugars once a day. DX E11.9 08/24/18   Jerrol Banana., MD  aspirin EC 81 MG tablet Take 81 mg by mouth daily.    [provider]  Blood Glucose Monitoring Suppl (ACCU-CHEK AVIVA PLUS) w/Device KIT 1 each by Does not apply route daily. Dx E11.9 03/29/18   Jerrol Banana., MD  ferrous sulfate 325 (65 FE) MG tablet TAKE 1 TABLET BY MOUTH EVERY OTHER DAY 08/03/20   Jerrol Banana., MD  furosemide (LASIX) 20 MG tablet TAKE 1 TABLET BY MOUTH EVERY DAY 08/25/20   Jerrol Banana., MD  hydrochlorothiazide (HYDRODIURIL) 25 MG tablet Take 1 tablet (25 mg total) by mouth daily. 10/06/20   Eulas Post  Brooke Bonito., MD  Insulin Pen Needle (B-D UF III MINI PEN NEEDLES) 31G X 5 MM MISC USE WITH INSULIN DOSES 08/18/20   Jerrol Banana., MD  LEVEMIR FLEXTOUCH 100 UNIT/ML FlexPen INJECT 15 UNITS INTO THE SKIN DAILY. IN THE AFTERNOON 08/03/20   Jerrol Banana., MD  metoprolol tartrate (LOPRESSOR) 25 MG tablet TAKE 1/2 TABLET BY MOUTH DAILY 08/25/20   Jerrol Banana., MD  timolol (TIMOPTIC) 0.5 % ophthalmic solution Place 1 drop into both eyes every morning. 01/03/19   [provider]     Allergies Sulfa antibiotics  Family History   Problem Relation Age of Onset  . Diabetes Sister   . Hypertension Mother     Social History Social History   Tobacco Use  . Smoking status: Never Smoker  . Smokeless tobacco: Never Used  Substance Use Topics  . Alcohol use: No  . Drug use: No    Review of Systems  Constitutional: No fever/chills Eyes: No visual changes.  ENT: No sore throat. Cardiovascular: Denies chest pain. Respiratory: Denies shortness of breath. Gastrointestinal: As above Genitourinary: Negative for dysuria. Musculoskeletal: Negative for back pain. Skin: Negative for rash. Neurological: Negative for headaches   ____________________________________________   PHYSICAL EXAM:  VITAL SIGNS: ED Triage Vitals  Enc Vitals Group     BP --      Pulse Rate 10/18/20 1928 100     Resp 10/18/20 1928 17     Temp 10/18/20 1928 98.5 F (36.9 C)     Temp Source 10/18/20 1928 Oral     SpO2 10/18/20 1928 98 %     Weight 10/18/20 1929 50 kg (110 lb 3.7 oz)     Height 10/18/20 1929 1.702 m (_0 )     Head Circumference --      Peak Flow --      Pain Score 10/18/20 1929 0     Pain Loc --      Pain Edu? --      Excl. in [place]? --     Constitutional: Alert and oriented.  Nose: No congestion/rhinnorhea. Mouth/Throat: Mucous membranes are moist.   Neck:  Painless ROM Cardiovascular: Normal rate, regular rhythm. Grossly normal heart sounds.  Good peripheral circulation. Respiratory: Normal respiratory effort.  No retractions. Lungs CTAB. Gastrointestinal: Soft and nontender. No distention.  No CVA tenderness.  Musculoskeletal:Warm and well perfused Neurologic:  Normal speech and language. No gross focal neurologic deficits are appreciated.  Skin:  Skin is warm, dry and intact. No rash noted. Psychiatric: Mood and affect are normal. Speech and behavior are normal.  ____________________________________________   LABS (all labs ordered are listed, but only abnormal results are displayed)  Labs Reviewed   CBC - Abnormal; Notable for the following components:      Result Value   RBC 3.21 (*)    Hemoglobin 8.2 (*)    HCT 26.1 (*)    MCH 25.5 (*)    RDW 17.3 (*)    Platelets 105 (*)    All other components within normal limits  COMPREHENSIVE METABOLIC PANEL - Abnormal; Notable for the following components:   CO2 21 (*)    Glucose, Bld 168 (*)    BUN 56 (*)    Creatinine, Ser 1.72 (*)    Total Protein 8.8 (*)    AST 456 (*)    ALT 163 (*)    Alkaline Phosphatase 527 (*)    Total Bilirubin 1.4 (*)    GFR, Estimated  28 (*)    All other components within normal limits  LIPASE, BLOOD - Abnormal; Notable for the following components:   Lipase 97 (*)    All other components within normal limits  SARS CORONAVIRUS 2 (TAT 6-24 HRS)   ____________________________________________  EKG  None ____________________________________________  RADIOLOGY  CT abdomen pelvis ____________________________________________   PROCEDURES  Procedure(s) performed: No  Procedures   Critical Care performed: No ____________________________________________   INITIAL IMPRESSION / ASSESSMENT AND PLAN / ED COURSE  Pertinent labs & imaging results that were available during my care of the patient were reviewed by me and considered in my medical decision making (see chart for details).  Patient presents with periumbilical abdominal pain, nausea and vomiting x2 days.  Currently with sick contacts as well, differential includes viral gastritis/gastroenteritis but given reports of abdominal pain, will give IV fluids, IV Zofran, obtain labs and obtain CT abdomen pelvis  Lab work is notable for elevated BUN to creatinine ratio, elevated AST ALT alk phos and mildly elevated lipase, pending CT scan  CT demonstrates concerning findings for possible choledocholithiasis although the patient has had a cholecystectomy.  ERCP is recommended.  We will discuss with the hospitalist for admission     ____________________________________________   FINAL CLINICAL IMPRESSION(S) / ED DIAGNOSES  Final diagnoses:  Choledocholithiasis        Note:  This document was prepared using Dragon voice recognition software and may include unintentional dictation errors.   Lavonia Drafts, MD 10/18/20 2217    Lavonia Drafts, MD 10/18/20 2251

## 2020-10-18 NOTE — H&P (Addendum)
History and Physical    Gabriela [name] FUX:[id number] DOB: 09/11/1925 DOA: 10/18/2020  PCP: Jerrol Banana., MD  Patient coming from: home  I have personally briefly reviewed patient's old medical records in [place]  Chief Complaint: n/v/abdominal pain  HPI: Gabriela [name] is a 87 y.o. 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  She notes associated n/v/d, chills no fever. She denies cough, uri sxs, or chest pain. She does endorse chronic doe but no worsening of her chronic symptoms. She denies dysuria or flank pain. History take from patient and grand daughter [name] [phone].  ED Course:  Vitals: afeb, 125/78, rr17, hr 95  Sat 98% on ra Labs: Wbc:7.5,hgb8.2 baseline 8-9,plt105, Na:139, k4.1,bicarb 21,glu168,cr 1.72 baseline 1.4-1.7, Alkphos:527, t-bili:1.4 Lipase 97 CT scan: IMPRESSION: 1. Cholecystectomy with intra and extrahepatic biliary ductal dilatation. There are least 2 intraluminal densities within the distal common bile duct, more proximal measuring 15 mm. This likely represents choledocholithiasis, but is nonspecific. The possibility of polyp or mass is not excluded. Recommend further evaluation with ERCP. MRCP is not recommended in this patient given inability to stay still for CT. 2. Mild proximal pancreatic ductal dilatation at 5 mm, no discrete pancreatic mass, although evaluation is limited by motion. 3. Questionable posterior rectal wall thickening measuring up to 2.1 cm. Recommend correlation with physical exam. 4. Right hip arthroplasty with  lucency adjacent to the acetabular cup, most consistent with particle disease. Moderate joint effusion. Findings were also seen on 01/17/2016 pelvis CT. 5. Small hiatal hernia with mild distal esophageal wall thickening, can be seen with reflux or esophagitis. 6. Possible mild bladder wall thickening. 7. Possible punctate nonobstructing stone in the upper left kidney.  Aortic Atherosclerosis (ICD10-I70.0). Review of Systems: As per HPI otherwise 10 point review of systems negative.   Past Medical History:  Diagnosis Date  . Arthritis   . Diabetes mellitus ([place])   . Glaucoma   . Hyperlipidemia   . Hypertension   . Shortness of breath    with exertion    Past Surgical History:  Procedure Laterality Date  . CARDIAC CATHETERIZATION  04/22/13  . CHOLECYSTECTOMY    . CORONARY ARTERY BYPASS GRAFT N/A 05/31/2013   Procedure: Coronary Artery Bypass Grafting Times Three Using Left Internal Mammary Artery and Right Saphenous Leg Vein Harvested Endoscopically;  Surgeon: Ivin Poot, MD;  Location: [facility];  Service: Open Heart Surgery;  Laterality: N/A;  . EYE SURGERY    . HIP SURGERY     right  . INTRAOPERATIVE TRANSESOPHAGEAL ECHOCARDIOGRAM N/A 05/31/2013   Procedure: INTRAOPERATIVE TRANSESOPHAGEAL ECHOCARDIOGRAM;  Surgeon: Ivin Poot, MD;  Location: [facility];  Service: Open Heart Surgery;  Laterality: N/A;  . JOINT REPLACEMENT    . MITRAL VALVE REPAIR N/A 05/31/2013   Procedure: MITRAL VALVE REPAIR (MVR);  Surgeon: Ivin Poot, MD;  Location: [facility];  Service: Open Heart Surgery;  Laterality: N/A;  . TONSILLECTOMY       reports that she has never smoked. She has never used smokeless tobacco. She reports that she does not drink alcohol and does not use drugs.  Allergies  Allergen Reactions  .  Sulfa Antibiotics Rash    Family History  Problem Relation Age of Onset  . Diabetes Sister   . Hypertension Mother     Prior to Admission medications   Medication Sig Start Date  End Date Taking? Authorizing Provider  ACCU-CHEK AVIVA PLUS test strip USE TO TEST SUGAR TWICE A DAY 12/30/19   Jerrol Banana., MD  ACCU-CHEK FASTCLIX LANCETS MISC To check blood sugars once a day. DX E11.9 08/24/18   Jerrol Banana., MD  aspirin EC 81 MG tablet Take 81 mg by mouth daily.    [provider]  Blood Glucose Monitoring Suppl (ACCU-CHEK AVIVA PLUS) w/Device KIT 1 each by Does not apply route daily. Dx E11.9 03/29/18   Jerrol Banana., MD  ferrous sulfate 325 (65 FE) MG tablet TAKE 1 TABLET BY MOUTH EVERY OTHER DAY 08/03/20   Jerrol Banana., MD  furosemide (LASIX) 20 MG tablet TAKE 1 TABLET BY MOUTH EVERY DAY 08/25/20   Jerrol Banana., MD  hydrochlorothiazide (HYDRODIURIL) 25 MG tablet Take 1 tablet (25 mg total) by mouth daily. 10/06/20   Jerrol Banana., MD  Insulin Pen Needle (B-D UF III MINI PEN NEEDLES) 31G X 5 MM MISC USE WITH INSULIN DOSES 08/18/20   Jerrol Banana., MD  LEVEMIR FLEXTOUCH 100 UNIT/ML FlexPen INJECT 15 UNITS INTO THE SKIN DAILY. IN THE AFTERNOON 08/03/20   Jerrol Banana., MD  metoprolol tartrate (LOPRESSOR) 25 MG tablet TAKE 1/2 TABLET BY MOUTH DAILY 08/25/20   Jerrol Banana., MD  timolol (TIMOPTIC) 0.5 % ophthalmic solution Place 1 drop into both eyes every morning. 01/03/19   [provider]    Physical Exam: Vitals:   10/18/20 1928 10/18/20 1929 10/18/20 2114 10/18/20 2231  BP:   125/78 130/74  Pulse: 100  95 88  Resp: _0 Temp: 98.5 F (36.9 C)     TempSrc: Oral     SpO2: 98%  98% 96%  Weight:  50 kg    Height:  _1  (1.702 m)      Vitals:   10/18/20 1928 10/18/20 1929 10/18/20 2114 10/18/20 2231  BP:   125/78 130/74  Pulse: 100  95 88  Resp: _2 Temp: 98.5 F (36.9 C)     TempSrc: Oral     SpO2: 98%  98% 96%  Weight:  50 kg    Height:  _3  (1.702 m)    Constitutional: NAD, calm, comfortable Eyes: PERRL, lids and conjunctivae normal ENMT: Mucous  membranes are dry. Posterior pharynx clear of any exudate or lesions.Normal dentition.  Neck: normal, supple, no masses, no thyromegaly Respiratory: clear to auscultation bilaterally, no wheezing, no crackles. Normal respiratory effort. No accessory muscle use.  Cardiovascular: Regular rate and rhythm, no murmurs / rubs / gallops. No extremity edema. 2+ pedal pulses. No carotid bruits.  Abdomen: + RUQ and epigastric tenderness , no masses palpated. No hepatosplenomegaly. Bowel sounds positive.  Musculoskeletal: no clubbing / cyanosis. No joint deformity upper and lower extremities. Good ROM, no contractures. Normal muscle tone.  Skin: no rashes, healing wound on right upper thigh,  No induration Neurologic: CN 2-12 grossly intact. Sensation intact Strength 4+/5 in all 4.  Psychiatric: Normal judgment and insight. Alert and oriented x 3. Normal mood.    Labs on Admission: I have personally reviewed following labs and imaging studies  CBC: Recent Labs  Lab 10/18/20  1933  WBC 7.5  HGB 8.2*  HCT 26.1*  MCV 81.3  PLT 270*   Basic Metabolic Panel: Recent Labs  Lab 10/18/20 1933  NA 139  K 4.1  CL 105  CO2 21*  GLUCOSE 168*  BUN 56*  CREATININE 1.72*  CALCIUM 8.9   GFR: Estimated Creatinine Clearance: 16.5 mL/min (A) (by C-G formula based on SCr of 1.72 mg/dL (H)). Liver Function Tests: Recent Labs  Lab 10/18/20 1933  AST 456*  ALT 163*  ALKPHOS 527*  BILITOT 1.4*  PROT 8.8*  ALBUMIN 4.0   Recent Labs  Lab 10/18/20 1933  LIPASE 97*   No results for input(s): AMMONIA in the last 168 hours. Coagulation Profile: No results for input(s): INR, PROTIME in the last 168 hours. Cardiac Enzymes: No results for input(s): CKTOTAL, CKMB, CKMBINDEX, TROPONINI in the last 168 hours. BNP (last 3 results) No results for input(s): PROBNP in the last 8760 hours. HbA1C: No results for input(s): HGBA1C in the last 72 hours. CBG: No results for input(s): GLUCAP in the last 168  hours. Lipid Profile: No results for input(s): CHOL, HDL, LDLCALC, TRIG, CHOLHDL, LDLDIRECT in the last 72 hours. Thyroid Function Tests: No results for input(s): TSH, T4TOTAL, FREET4, T3FREE, THYROIDAB in the last 72 hours. Anemia Panel: No results for input(s): VITAMINB12, FOLATE, FERRITIN, TIBC, IRON, RETICCTPCT in the last 72 hours. Urine analysis:    Component Value Date/Time   COLORURINE YELLOW 05/28/2013 1328   APPEARANCEUR CLEAR 05/28/2013 1328   LABSPEC 1.012 05/28/2013 1328   PHURINE 7.0 05/28/2013 1328   GLUCOSEU NEGATIVE 05/28/2013 1328   HGBUR NEGATIVE 05/28/2013 [place] 05/28/2013 [place] 05/28/2013 1328   PROTEINUR NEGATIVE 05/28/2013 1328   UROBILINOGEN 0.2 05/28/2013 1328   NITRITE NEGATIVE 05/28/2013 1328   LEUKOCYTESUR SMALL (A) 05/28/2013 1328    Radiological Exams on Admission: CT ABDOMEN PELVIS W CONTRAST  Result Date: 10/18/2020 CLINICAL DATA:  87 year old with acute abdominal pain. Elevated LFTs and lipase. Nausea and vomiting. EXAM: CT ABDOMEN AND PELVIS WITH CONTRAST TECHNIQUE: Multidetector CT imaging of the abdomen and pelvis was performed using the standard protocol following bolus administration of intravenous contrast. CONTRAST:  53m OMNIPAQUE IOHEXOL 300 MG/ML  SOLN COMPARISON:  None. FINDINGS: Lower chest: Small hiatal hernia. Motion artifact in the lung bases limits parenchymal evaluation. Coronary artery calcifications. 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 No pleural fluid. Hepatobiliary: Cholecystectomy. Moderate intrahepatic biliary ductal dilatation. Dilated common bile duct at 17 mm. There are least 2 intraluminal densities within the distal common bile duct, more proximal measuring 15 mm. This likely represents choledocholithiasis, but is nonspecific. No focal hepatic lesion. Pancreas: Pancreas is atrophic. Mild proximal pancreatic ductal dilatation at 5 mm. No definite peripancreatic fat stranding. No discrete  pancreatic mass, although evaluation is limited by motion. Spleen: Normal in size without focal abnormality. Adrenals/Urinary Tract: Normal adrenal glands. Bilateral extrarenal pelvis configuration of both kidneys. No perinephric edema. No focal renal abnormality. Probable punctate nonobstructing stone in the upper left kidney. There may be mild bladder wall thickening. Stomach/Bowel: Small hiatal hernia. Mild distal esophageal wall thickening. Stomach otherwise unremarkable. No small bowel obstruction or inflammation. Normal appendix. Majority of the colon is decompressed. No colonic inflammation. Questionable posterior rectal wall thickening measuring up to 2.1 cm. Vascular/Lymphatic: Moderate aortic and branch atherosclerosis. No aortic aneurysm. No enlarged lymph nodes in the abdomen or pelvis. Reproductive: Hysterectomy without adnexal mass. Other: No abdominal ascites.  No free air or focal fluid collection. Musculoskeletal:  Right hip arthroplasty. Lobulated lucency adjacent to the acetabular cup, series 2, image 63 with moderate joint effusion. Advanced left hip osteoarthritis. Multilevel degenerative change in the spine. IMPRESSION: 1. Cholecystectomy with intra and extrahepatic biliary ductal dilatation. There are least 2 intraluminal densities within the distal common bile duct, more proximal measuring 15 mm. This likely represents choledocholithiasis, but is nonspecific. The possibility of polyp or mass is not excluded. Recommend further evaluation with ERCP. MRCP is not recommended in this patient given inability to stay still for CT. 2. Mild proximal pancreatic ductal dilatation at 5 mm, no discrete pancreatic mass, although evaluation is limited by motion. 3. Questionable posterior rectal wall thickening measuring up to 2.1 cm. Recommend correlation with physical exam. 4. Right hip arthroplasty with lucency adjacent to the acetabular cup, most consistent with particle disease. Moderate joint effusion.  Findings were also seen on 01/17/2016 pelvis CT. 5. Small hiatal hernia with mild distal esophageal wall thickening, can be seen with reflux or esophagitis. 6. Possible mild bladder wall thickening. 7. Possible punctate nonobstructing stone in the upper left kidney. Aortic Atherosclerosis (ICD10-I70.0). Electronically Signed   By: Keith Rake M.D.   On: 10/18/2020 22:02    EKG: Independently reviewed. n/a  Assessment/Plan   ?Choledocholithiasis without apparent cholangitis vs mass -patient w/o fever/or sirs criteria -+RUQ pain /n/v -s/p cholecystectomy with noted dilation and what appears to be retained stones on imaging -elevated lfts , with tibili <2,but noting alkphos of 527,ast456, alt 163 -npo for possible intervention  -no need for abx,currenlty, however if patient should spike fever would have low threshold for starting antibiotics  -will cycle inflammatory markers, check lactate and procalcitonin -monitor lfts  -check inr  -f/u gi rec in am   Milk AKI on CKDIII -hold nephrotoxic medications  -gently ivfs overnight    posterior rectal wall thickening -f/u further gi recs  Mild bladder wall thickening UA with LE -urine culture ordered   ?Esophageal wall thinking  -possible esophagitis /gerd - ppi iv daily  CAD s/p CABG -continue home regimen   S/p mitral valve repair   Ischemic CMY  -lasix daily  -resume lasix as able  -strict I/o daily weights  -followed by Dr Cammie Sickle  Chronic thrombocytopenia -monitor counts   DMII insulin dependent -check a1c  -place on glucose surviellance -hold long acting overnight , start low dose iss  HLD -check lipids  -diet controlled   HTN -continue metoprolol as bp tolerates  -hold hctz,lasix   Glaucoma -no active issues  -eye drops will need to be restarted once med rec completed  Arthritis/ Failing Right Hip prothesis  -with hx of chronic right thigh wound due to this -followed by out patient  orthopedics -supportive care   Anemia of Chronic disease -hgb around baseline  -check iron studies, epo level   DVT prophylaxis: heparin  Code Status: Full Family Communication: n/a Disposition Plan: patient  expected to be admitted greater than 2 midnights Consults called: GI ,[place]  Admission status: inpatient , med-tele   Clance Boll MD [facility]   If 7PM-7AM, please contact night-coverage [email or web address] Password [facility]  10/18/2020, 10:52 PM

## 2020-10-18 NOTE — ED Triage Notes (Signed)
X 2 days nausea and vomiting , x 3 episodes

## 2020-10-18 NOTE — ED Notes (Signed)
Pt soiled herself all sheets and brief changed

## 2020-10-19 ENCOUNTER — Encounter: Payer: Self-pay | Admitting: Internal Medicine

## 2020-10-19 ENCOUNTER — Inpatient Hospital Stay: Payer: Medicare Other

## 2020-10-19 DIAGNOSIS — N179 Acute kidney failure, unspecified: Secondary | ICD-10-CM

## 2020-10-19 DIAGNOSIS — K851 Biliary acute pancreatitis without necrosis or infection: Secondary | ICD-10-CM

## 2020-10-19 DIAGNOSIS — K8309 Other cholangitis: Secondary | ICD-10-CM

## 2020-10-19 LAB — CBC
HCT: 21.8 % — ABNORMAL LOW (ref 36.0–46.0)
HCT: 25.4 % — ABNORMAL LOW (ref 36.0–46.0)
Hemoglobin: 6.8 g/dL — ABNORMAL LOW (ref 12.0–15.0)
Hemoglobin: 8 g/dL — ABNORMAL LOW (ref 12.0–15.0)
MCH: 25.2 pg — ABNORMAL LOW (ref 26.0–34.0)
MCH: 26.3 pg (ref 26.0–34.0)
MCHC: 31.2 g/dL (ref 30.0–36.0)
MCHC: 31.5 g/dL (ref 30.0–36.0)
MCV: 80.7 fL (ref 80.0–100.0)
MCV: 83.6 fL (ref 80.0–100.0)
Platelets: 91 10*3/uL — ABNORMAL LOW (ref 150–400)
Platelets: 79 10*3/uL — ABNORMAL LOW (ref 150–400)
RBC: 2.7 MIL/uL — ABNORMAL LOW (ref 3.87–5.11)
RBC: 3.04 MIL/uL — ABNORMAL LOW (ref 3.87–5.11)
RDW: 17.5 % — ABNORMAL HIGH (ref 11.5–15.5)
RDW: 17.5 % — ABNORMAL HIGH (ref 11.5–15.5)
WBC: 25 10*3/uL — ABNORMAL HIGH (ref 4.0–10.5)
WBC: 28.5 10*3/uL — ABNORMAL HIGH (ref 4.0–10.5)
nRBC: 0 % (ref 0.0–0.2)
nRBC: 0 % (ref 0.0–0.2)

## 2020-10-19 LAB — COMPREHENSIVE METABOLIC PANEL
ALT: 357 U/L — ABNORMAL HIGH (ref 0–44)
ALT: 317 U/L — ABNORMAL HIGH (ref 0–44)
AST: 751 U/L — ABNORMAL HIGH (ref 15–41)
AST: 414 U/L — ABNORMAL HIGH (ref 15–41)
Albumin: 3.2 g/dL — ABNORMAL LOW (ref 3.5–5.0)
Albumin: 3.1 g/dL — ABNORMAL LOW (ref 3.5–5.0)
Alkaline Phosphatase: 496 U/L — ABNORMAL HIGH (ref 38–126)
Alkaline Phosphatase: 408 U/L — ABNORMAL HIGH (ref 38–126)
Anion gap: 12 (ref 5–15)
Anion gap: 12 (ref 5–15)
BUN: 58 mg/dL — ABNORMAL HIGH (ref 8–23)
BUN: 59 mg/dL — ABNORMAL HIGH (ref 8–23)
CO2: 20 mmol/L — ABNORMAL LOW (ref 22–32)
CO2: 15 mmol/L — ABNORMAL LOW (ref 22–32)
Calcium: 8.2 mg/dL — ABNORMAL LOW (ref 8.9–10.3)
Calcium: 7.3 mg/dL — ABNORMAL LOW (ref 8.9–10.3)
Chloride: 107 mmol/L (ref 98–111)
Chloride: 109 mmol/L (ref 98–111)
Creatinine, Ser: 2.07 mg/dL — ABNORMAL HIGH (ref 0.44–1.00)
Creatinine, Ser: 2.2 mg/dL — ABNORMAL HIGH (ref 0.44–1.00)
GFR, Estimated: 22 mL/min — ABNORMAL LOW (ref >60)
GFR, Estimated: 21 mL/min — ABNORMAL LOW (ref >60)
Glucose, Bld: 158 mg/dL — ABNORMAL HIGH (ref 70–99)
Glucose, Bld: 167 mg/dL — ABNORMAL HIGH (ref 70–99)
Potassium: 3.3 mmol/L — ABNORMAL LOW (ref 3.5–5.1)
Potassium: 4.1 mmol/L (ref 3.5–5.1)
Sodium: 139 mmol/L (ref 135–145)
Sodium: 136 mmol/L (ref 135–145)
Total Bilirubin: 2.5 mg/dL — ABNORMAL HIGH (ref 0.3–1.2)
Total Bilirubin: 3.5 mg/dL — ABNORMAL HIGH (ref 0.3–1.2)
Total Protein: 7.4 g/dL (ref 6.5–8.1)
Total Protein: 7.1 g/dL (ref 6.5–8.1)

## 2020-10-19 LAB — HEMOGLOBIN A1C
Hgb A1c MFr Bld: 6.6 % — ABNORMAL HIGH (ref 4.8–5.6)
Mean Plasma Glucose: 142.72 mg/dL

## 2020-10-19 LAB — APTT: aPTT: 32 seconds (ref 24–36)

## 2020-10-19 LAB — HEPATITIS PANEL, ACUTE
HCV Ab: NONREACTIVE
Hep A IgM: NONREACTIVE
Hep B C IgM: NONREACTIVE
Hepatitis B Surface Ag: NONREACTIVE

## 2020-10-19 LAB — GLUCOSE, CAPILLARY
Glucose-Capillary: 130 mg/dL — ABNORMAL HIGH (ref 70–99)
Glucose-Capillary: 153 mg/dL — ABNORMAL HIGH (ref 70–99)
Glucose-Capillary: 163 mg/dL — ABNORMAL HIGH (ref 70–99)
Glucose-Capillary: 169 mg/dL — ABNORMAL HIGH (ref 70–99)

## 2020-10-19 LAB — PROTIME-INR
INR: 1.3 — ABNORMAL HIGH (ref 0.8–1.2)
Prothrombin Time: 15.7 seconds — ABNORMAL HIGH (ref 11.4–15.2)

## 2020-10-19 LAB — PROCALCITONIN: Procalcitonin: 52.19 ng/mL

## 2020-10-19 LAB — LACTIC ACID, PLASMA
Lactic Acid, Venous: 2.6 mmol/L (ref 0.5–1.9)
Lactic Acid, Venous: 2.4 mmol/L (ref 0.5–1.9)
Lactic Acid, Venous: 2.4 mmol/L (ref 0.5–1.9)

## 2020-10-19 LAB — MRSA PCR SCREENING: MRSA by PCR: NEGATIVE

## 2020-10-19 LAB — PREPARE RBC (CROSSMATCH)

## 2020-10-19 LAB — LIPASE, BLOOD: Lipase: 647 U/L — ABNORMAL HIGH (ref 11–51)

## 2020-10-19 LAB — SARS CORONAVIRUS 2 (TAT 6-24 HRS): SARS Coronavirus 2: NEGATIVE

## 2020-10-19 LAB — TSH: TSH: 1.006 u[IU]/mL (ref 0.350–4.500)

## 2020-10-19 MED ORDER — NOREPINEPHRINE 4 MG/250ML-% IV SOLN
2.0000 ug/min | INTRAVENOUS | Status: DC
  Administered 2020-10-19: 1 ug/min via INTRAVENOUS

## 2020-10-19 MED ORDER — SODIUM CHLORIDE 0.9 % IV SOLN: 250.0000 mL | INTRAVENOUS | Status: DC

## 2020-10-19 MED ORDER — LACTATED RINGERS IV SOLN: INTRAVENOUS | Status: DC

## 2020-10-19 MED ORDER — PIPERACILLIN-TAZOBACTAM IN DEX 2-0.25 GM/50ML IV SOLN
2.2500 g | Freq: Four times a day (QID) | INTRAVENOUS | Status: DC
  Administered 2020-10-19 – 2020-10-20 (×4): 2.25 g via INTRAVENOUS
  Filled 2020-10-19 (×8): qty 50

## 2020-10-19 MED ORDER — SODIUM CHLORIDE 0.9% IV SOLUTION: Freq: Once | INTRAVENOUS | Status: AC

## 2020-10-19 MED ORDER — SODIUM CHLORIDE 0.9 % IV BOLUS
500.0000 mL | Freq: Once | INTRAVENOUS | Status: AC
  Administered 2020-10-19: 500 mL via INTRAVENOUS

## 2020-10-19 MED ORDER — SODIUM CHLORIDE 0.9 % IV BOLUS
1000.0000 mL | Freq: Once | INTRAVENOUS | Status: AC
  Administered 2020-10-19: 1000 mL via INTRAVENOUS

## 2020-10-19 MED ORDER — NOREPINEPHRINE 4 MG/250ML-% IV SOLN
INTRAVENOUS | Status: AC
  Filled 2020-10-19: qty 250

## 2020-10-19 MED ORDER — SODIUM CHLORIDE 0.9 % IV BOLUS: 500.0000 mL | Freq: Once | INTRAVENOUS | Status: DC

## 2020-10-19 MED ORDER — CHLORHEXIDINE GLUCONATE CLOTH 2 % EX PADS
6.0000 | MEDICATED_PAD | Freq: Every day | CUTANEOUS | Status: DC
  Administered 2020-10-19 – 2020-10-20 (×2): 6 via TOPICAL

## 2020-10-19 NOTE — Progress Notes (Signed)
OT Cancellation Note  Patient Details Name: Gabriela [name] MRN: [id number] DOB: 01-Oct-1925   Cancelled Treatment:    Reason Eval/Treat Not Completed: Medical issues which prohibited therapy;Patient not medically ready. Pt noted with transfer to the ICU due to change in medical status. No continue at transfer noted in OT consult. Will sign off. Please re-consult when pt is medically stable and appropriate for participation in therapy.   Hanley Hays, MPH, MS, OTR/L ascom [phone] 10/19/20, 4:19 PM

## 2020-10-19 NOTE — Plan of Care (Signed)
  Problem: Education: Goal: Knowledge of General Education information will improve Description: Including pain rating scale, medication(s)/side effects and non-pharmacologic comfort measures 10/19/2020 0938 by Vergie Living, RN Outcome: Progressing 10/19/2020 0937 by Jannifer Rodney A, RN Outcome: Progressing   Problem: Health Behavior/Discharge Planning: Goal: Ability to manage health-related needs will improve 10/19/2020 0938 by Vergie Living, RN Outcome: Progressing 10/19/2020 0937 by Jannifer Rodney A, RN Outcome: Progressing   Problem: Clinical Measurements: Goal: Ability to maintain clinical measurements within normal limits will improve 10/19/2020 0938 by Vergie Living, RN Outcome: Progressing 10/19/2020 0937 by Jannifer Rodney A, RN Outcome: Progressing Goal: Respiratory complications will improve 10/19/2020 0938 by Vergie Living, RN Outcome: Progressing 10/19/2020 0937 by Jannifer Rodney A, RN Outcome: Progressing Goal: Cardiovascular complication will be avoided 10/19/2020 0938 by Jannifer Rodney A, RN Outcome: Progressing 10/19/2020 0937 by Vergie Living, RN Outcome: Progressing   Problem: Activity: Goal: Risk for activity intolerance will decrease 10/19/2020 0938 by Vergie Living, RN Outcome: Progressing 10/19/2020 0937 by Jannifer Rodney A, RN Outcome: Progressing   Problem: Nutrition: Goal: Adequate nutrition will be maintained 10/19/2020 0938 by Vergie Living, RN Outcome: Progressing 10/19/2020 0937 by Jannifer Rodney A, RN Outcome: Progressing   Problem: Coping: Goal: Level of anxiety will decrease 10/19/2020 0938 by Vergie Living, RN Outcome: Progressing 10/19/2020 0937 by Jannifer Rodney A, RN Outcome: Progressing   Problem: Elimination: Goal: Will not experience complications related to bowel motility 10/19/2020 0938 by Vergie Living, RN Outcome: Progressing 10/19/2020 0937 by Jannifer Rodney A, RN Outcome: Progressing Goal: Will not experience  complications related to urinary retention 10/19/2020 0938 by Vergie Living, RN Outcome: Progressing 10/19/2020 0937 by Jannifer Rodney A, RN Outcome: Progressing   Problem: Pain Managment: Goal: General experience of comfort will improve 10/19/2020 0938 by Vergie Living, RN Outcome: Progressing 10/19/2020 0937 by Jannifer Rodney A, RN Outcome: Progressing   Problem: Safety: Goal: Ability to remain free from injury will improve 10/19/2020 0938 by Jannifer Rodney A, RN Outcome: Progressing 10/19/2020 0937 by Jannifer Rodney A, RN Outcome: Progressing   Problem: Skin Integrity: Goal: Risk for impaired skin integrity will decrease 10/19/2020 0938 by Vergie Living, RN Outcome: Progressing 10/19/2020 0937 by Vergie Living, RN Outcome: Progressing

## 2020-10-19 NOTE — Progress Notes (Signed)
   10/19/20 0938  Assess: MEWS Score  Temp 99.1 F (37.3 C)  BP (!) 93/37  Pulse Rate 75  Resp (!) 22  SpO2 100 %  O2 Device Room Air  Assess: MEWS Score  MEWS Temp 0  MEWS Systolic 1  MEWS Pulse 0  MEWS RR 1  MEWS LOC 0  MEWS Score 2  MEWS Score Color Yellow  Assess: if the MEWS score is Yellow or Red  Were vital signs taken at a resting state? Yes  Focused Assessment No change from prior assessment  Early Detection of Sepsis Score *See Row Information* High  MEWS guidelines implemented *See Row Information* Yes  Take Vital Signs  Increase Vital Sign Frequency  Yellow: Q 2hr X 2 then Q 4hr X 2, if remains yellow, continue Q 4hrs  Escalate  MEWS: Escalate Yellow: discuss with charge nurse/RN and consider discussing with provider and RRT  Notify: Charge Nurse/RN  Name of Charge Nurse/RN Notified Brandi, Therapist, sports (Director also notified)  Date Charge Nurse/RN Notified 10/19/20  Time Charge Nurse/RN Notified 1000  Notify: Provider  Provider Name/Title DR. Billie Ruddy  Date Provider Notified 10/19/20  Time Provider Notified 0945  Notification Type Page  Notification Reason Change in status (Possible sepsis)  Provider response See new orders  Date of Provider Response 10/19/20  Time of Provider Response [phone]  Document  Patient Outcome Other (Comment) (unknown at this time)  Progress note created (see row info) Yes

## 2020-10-19 NOTE — Progress Notes (Signed)
Patient transferred from the ED via stretcher to room 104 between 1a-1:30am.. Educated and informed patient of fall risk policy and oriented patient to the room and room equipment. Telemetry box placed and verified with CCMD with NT.  Upon admission to the floor, patient had temp of 101.4. Notified on-call NP. Orders given to administer 560ml bolus and labs were drawn. Bolus given late due to patient's PIV was dislodged and therefore removed. RN attempted twice at getting a new PIV then IV Team was ordered and notified. Now has Left forearm PIV and is tolerating it well.  Skin assessment completed has a small healed flesh-color scar to sacral area. Sacral dressing applied. Grand-dtr [name] called to check on patient. Patient complained of no pain or discomfort during the night. Patient not a good historian and therefore admission question not completed at this time.

## 2020-10-19 NOTE — Progress Notes (Signed)
OT Cancellation Note  Patient Details Name: Gabriela [name] MRN: [id number] DOB: 1926-04-20   Cancelled Treatment:    Reason Eval/Treat Not Completed: Medical issues which prohibited therapy. Consult received, chart reviewed. Pt noted with drop in Hgb from 8.2 to 6.8 <24 hrs. BP low (most recent 89/43). Will hold OT evaluation at this time and re-attempt at later date/time as medically appropriate.   Hanley Hays, MPH, MS, OTR/L ascom [phone] 10/19/20, 8:20 AM

## 2020-10-19 NOTE — Consult Note (Signed)
Gabriela Darby, MD [address]  [place]  [place], [place] [address]  Main: [phone]  Fax: [phone] Pager: [phone]   Consultation  Referring Provider:     No ref. provider found Primary Care Physician:  Jerrol Banana., MD Primary Gastroenterologist:  Althia Forts       Reason for Consultation:     choledocholithiasis  Date of Admission:  10/18/2020 Date of Consultation:  10/19/2020         HPI:   Gabriela [name] is a 87 y.o. 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  She has mild tachycardic in the ER.  Labs revealed AKI, elevated LFTs, alkaline phosphatase 527, AST 456, ALT 163, total bilirubin 1.4, lactic acid 2.6, lipase 97, CBC revealed thrombocytopenia which is chronic, normocytic anemia hemoglobin 8.2, normal WBC count.  She subsequently underwent CT abdomen and pelvis, history of cholecystectomy, dilated common bile duct to 17 mm, at least 2 intraluminal densities within distal CBD, more proximal measuring 15 mm, likely choledocholithiasis.  Atrophic pancreas with mildly dilated pancreatic duct.  Patient is admitted to the floor and GI is consulted to evaluate for ERCP.  This morning, patient found to have mild hypotension, new onset of leukocytosis, WBC count 20 5K, hemoglobin 6.8, worsening LFTs and BUN/creatinine, alkaline phosphatase 496, AST 751, ALT 357, total bilirubin 2.5 and serum lipase 647.  Patient is started on IV fluids, 2 units of PRBCs are ordered, started on IV Zosyn.  Her lactic acid remains at 2.4.  She was febrile overnight, T-max 101.4, tachypneic, however oxygenating 100% on room air.  Patient is currently mentating well, able to answer questions  appropriately.  Patient son and her granddaughter are at bedside.  Apparently, patient is functionally independent, able to cook and take care of herself at home, she lives alone and her granddaughter lives across her home.  Cholecystectomy in 2014  NSAIDs: None  Antiplts/Anticoagulants/Anti thrombotics: None  GI Procedures: None  Past Medical History:  Diagnosis Date  . Arthritis   . Diabetes mellitus ([place])   . Glaucoma   . Hyperlipidemia   . Hypertension   . Shortness of breath    with exertion    Past Surgical History:  Procedure Laterality Date  . CARDIAC CATHETERIZATION  04/22/13  . CHOLECYSTECTOMY    . CORONARY ARTERY BYPASS GRAFT N/A 05/31/2013   Procedure: Coronary Artery Bypass Grafting Times Three Using Left Internal Mammary Artery and Right Saphenous Leg Vein Harvested Endoscopically;  Surgeon: Ivin Poot, MD;  Location: [facility];  Service: Open Heart Surgery;  Laterality: N/A;  . EYE SURGERY    . HIP SURGERY     right  . INTRAOPERATIVE TRANSESOPHAGEAL ECHOCARDIOGRAM N/A 05/31/2013   Procedure: INTRAOPERATIVE TRANSESOPHAGEAL ECHOCARDIOGRAM;  Surgeon: Ivin Poot, MD;  Location: [facility];  Service: Open Heart Surgery;  Laterality: N/A;  . JOINT REPLACEMENT    . MITRAL VALVE REPAIR N/A 05/31/2013   Procedure: MITRAL VALVE REPAIR (MVR);  Surgeon: Ivin Poot, MD;  Location: [facility];  Service: Open Heart Surgery;  Laterality: N/A;  . TONSILLECTOMY      Current Facility-Administered Medications:  .  albuterol (PROVENTIL) (2.5 MG/3ML) 0.083% nebulizer solution 2.5 mg, 2.5 mg, Nebulization, Q2H PRN, Marcello Moores,  Quincy Carnes, MD .  heparin injection 5,000 Units, 5,000 Units, Subcutaneous, Q8H, Clance Boll, MD, 5,000 Units at 10/19/20 1445 .  morphine 2 MG/ML injection 1 mg, 1 mg, Intravenous, Q4H PRN, Myles Rosenthal A, MD .  ondansetron (ZOFRAN) tablet 4 mg, 4 mg, Oral, Q6H PRN **OR** ondansetron (ZOFRAN) injection 4 mg, 4 mg, Intravenous, Q6H PRN, Myles Rosenthal A, MD .  oxyCODONE (Oxy IR/ROXICODONE) immediate release tablet 2.5 mg, 2.5 mg, Oral, Q6H PRN, Myles Rosenthal A, MD .  pantoprazole (PROTONIX) injection 40 mg, 40 mg, Intravenous, Q24H, Myles Rosenthal A, MD, 40 mg at 10/19/20 0206 .  piperacillin-tazobactam (ZOSYN) IVPB 2.25 g, 2.25 g, Intravenous, Q6H, Shanlever, Pierce Crane, RPH, Last Rate: 100 mL/hr at 10/19/20 1118, 2.25 g at 10/19/20 1118   Family History  Problem Relation Age of Onset  . Diabetes Sister   . Hypertension Mother      Social History   Tobacco Use  . Smoking status: Never Smoker  . Smokeless tobacco: Never Used  Substance Use Topics  . Alcohol use: No  . Drug use: No    Allergies as of 10/18/2020 - Review Complete 10/18/2020  Allergen Reaction Noted  . Sulfa antibiotics Rash 04/17/2015    Review of Systems:    All systems reviewed and negative except where noted in HPI.   Physical Exam:  Vital signs in last 24 hours: Temp:  [97.7 F (36.5 C)-101.4 F (38.6 C)] 98.7 F (37.1 C) (03/07 1357) Pulse Rate:  [65-100] 65 (03/07 1441) Resp:  [16-25] 24 (03/07 1357) BP: (73-130)/(34-78) 75/43 (03/07 1441) SpO2:  [96 %-100 %] 100 % (03/07 1441) Weight:  [50 kg] 50 kg (03/06 1929)   General:   Pleasant, cooperative in NAD Head:  Normocephalic and atraumatic. Eyes:   No icterus.   Conjunctiva pink. PERRLA. Ears:  Normal auditory acuity. Neck:  Supple; no masses or thyroidomegaly Lungs: Tachypnea, lungs bilateral crackles  Heart:  Regular rate and rhythm;  Without murmur, clicks, rubs or gallops Abdomen:  Soft, nondistended, nontender. Normal bowel sounds. No appreciable masses or hepatomegaly.  No rebound or guarding.  Rectal:  Not performed. Msk:  Symmetrical without gross deformities.  Strength generalized weakness Extremities:  Without edema, cyanosis or clubbing. Neurologic:  Alert and oriented x3;  grossly normal neurologically. Skin:  Intact without significant lesions or  rashes. Psych:  Alert and cooperative. Normal affect.  LAB RESULTS: CBC Latest Ref Rng & Units 10/19/2020 10/18/2020 07/28/2020  WBC 4.0 - 10.5 K/uL 25.0(H) 7.5 6.1  Hemoglobin 12.0 - 15.0 g/dL 6.8(L) 8.2(L) 9.0(L)  Hematocrit 36.0 - 46.0 % 21.8(L) 26.1(L) 28.2(L)  Platelets 150 - 400 K/uL 91(L) 105(L) 95(LL)    BMET BMP Latest Ref Rng & Units 10/19/2020 10/18/2020 07/28/2020  Glucose 70 - 99 mg/dL 158(H) 168(H) 112(H)  BUN 8 - 23 mg/dL 58(H) 56(H) 46(H)  Creatinine 0.44 - 1.00 mg/dL 2.07(H) 1.72(H) 1.68(H)  BUN/Creat Ratio 12 - 28 - - 27  Sodium 135 - 145 mmol/L 139 139 137  Potassium 3.5 - 5.1 mmol/L 3.3(L) 4.1 4.6  Chloride 98 - 111 mmol/L 107 105 101  CO2 22 - 32 mmol/L 20(L) 21(L) 20  Calcium 8.9 - 10.3 mg/dL 8.2(L) 8.9 9.1    LFT Hepatic Function Latest Ref Rng & Units 10/19/2020 10/18/2020 07/28/2020  Total Protein 6.5 - 8.1 g/dL 7.4 8.8(H) -  Albumin 3.5 - 5.0 g/dL 3.2(L) 4.0 4.4  AST 15 - 41 U/L 751(H) 456(H) -  ALT 0 - 44  U/L 357(H) 163(H) -  Alk Phosphatase 38 - 126 U/L 496(H) 527(H) -  Total Bilirubin 0.3 - 1.2 mg/dL 2.5(H) 1.4(H) -     STUDIES: CT ABDOMEN PELVIS W CONTRAST  Result Date: 10/18/2020 CLINICAL DATA:  87 year old with acute abdominal pain. Elevated LFTs and lipase. Nausea and vomiting. EXAM: CT ABDOMEN AND PELVIS WITH CONTRAST TECHNIQUE: Multidetector CT imaging of the abdomen and pelvis was performed using the standard protocol following bolus administration of intravenous contrast. CONTRAST:  88m OMNIPAQUE IOHEXOL 300 MG/ML  SOLN COMPARISON:  None. FINDINGS: Lower chest: Small hiatal hernia. Motion artifact in the lung bases limits parenchymal evaluation. Coronary artery calcifications. 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 No pleural fluid. Hepatobiliary: Cholecystectomy. Moderate intrahepatic biliary ductal dilatation. Dilated common bile duct at 17 mm. There are least 2 intraluminal densities within the distal common bile duct, more proximal measuring 15 mm. This  likely represents choledocholithiasis, but is nonspecific. No focal hepatic lesion. Pancreas: Pancreas is atrophic. Mild proximal pancreatic ductal dilatation at 5 mm. No definite peripancreatic fat stranding. No discrete pancreatic mass, although evaluation is limited by motion. Spleen: Normal in size without focal abnormality. Adrenals/Urinary Tract: Normal adrenal glands. Bilateral extrarenal pelvis configuration of both kidneys. No perinephric edema. No focal renal abnormality. Probable punctate nonobstructing stone in the upper left kidney. There may be mild bladder wall thickening. Stomach/Bowel: Small hiatal hernia. Mild distal esophageal wall thickening. Stomach otherwise unremarkable. No small bowel obstruction or inflammation. Normal appendix. Majority of the colon is decompressed. No colonic inflammation. Questionable posterior rectal wall thickening measuring up to 2.1 cm. Vascular/Lymphatic: Moderate aortic and branch atherosclerosis. No aortic aneurysm. No enlarged lymph nodes in the abdomen or pelvis. Reproductive: Hysterectomy without adnexal mass. Other: No abdominal ascites.  No free air or focal fluid collection. Musculoskeletal: Right hip arthroplasty. Lobulated lucency adjacent to the acetabular cup, series 2, image 63 with moderate joint effusion. Advanced left hip osteoarthritis. Multilevel degenerative change in the spine. IMPRESSION: 1. Cholecystectomy with intra and extrahepatic biliary ductal dilatation. There are least 2 intraluminal densities within the distal common bile duct, more proximal measuring 15 mm. This likely represents choledocholithiasis, but is nonspecific. The possibility of polyp or mass is not excluded. Recommend further evaluation with ERCP. MRCP is not recommended in this patient given inability to stay still for CT. 2. Mild proximal pancreatic ductal dilatation at 5 mm, no discrete pancreatic mass, although evaluation is limited by motion. 3. Questionable posterior  rectal wall thickening measuring up to 2.1 cm. Recommend correlation with physical exam. 4. Right hip arthroplasty with lucency adjacent to the acetabular cup, most consistent with particle disease. Moderate joint effusion. Findings were also seen on 01/17/2016 pelvis CT. 5. Small hiatal hernia with mild distal esophageal wall thickening, can be seen with reflux or esophagitis. 6. Possible mild bladder wall thickening. 7. Possible punctate nonobstructing stone in the upper left kidney. Aortic Atherosclerosis (ICD10-I70.0). Electronically Signed   By: MKeith RakeM.D.   On: 10/18/2020 22:02   DG Chest Port 1 View  Result Date: 10/19/2020 CLINICAL DATA:  Shortness of breath for 2 days EXAM: PORTABLE CHEST 1 VIEW COMPARISON:  05/19/2018 FINDINGS: Cardiac shadow is enlarged but stable. Postsurgical changes are again seen. Mild vascular congestion is noted without significant interstitial edema. No focal infiltrate is noted. IMPRESSION: Early CHF without significant edema. Electronically Signed   By: MInez CatalinaM.D.   On: 10/19/2020 01:08      Impression / Plan:   LBERRY GALLACHERis a 87y.o. female  with history of diabetes, glaucoma, CKD, hypertension, cholecystectomy in 2014 is admitted with symptomatic choledocholithiasis and acute gallstone pancreatitis, patient developed fever overnight, currently in severe sepsis, concern for ascending cholangitis  Elevated LFTs secondary to choledocholithiasis and probable ascending cholangitis Continue Zosyn, renally dosed Monitor LFTs closely Recommend blood cultures Maintain n.p.o. except medications Patient is currently resuscitated for low blood pressure as well as anemia 2 units of PRBCs have been ordered If patient is adequately resuscitated, will plan to do ERCP tomorrow.  If patient is unstable to undergo ERCP with anesthesia, recommend IR consult for PTC drain placement in setting of ascending cholangitis Recommend ICU transfer for higher level  of care and patient might need pressor support, I have personally discussed my recommendations with the hospitalist, Dr. Billie Ruddy  Acute gallstone pancreatitis Recommend aggressive resuscitation with LR at 100 mL/hr Maintain n.p.o. except meds Tentative plan for ERCP as above  I have discussed in detail the patient's condition, my recommendations with patient's son as well as her granddaughter who are bedside   Thank you for involving me in the care of this patient.  GI will follow along with you    LOS: 1 day   Sherri Sear, MD  10/19/2020, 3:26 PM   Note: This dictation was prepared with Dragon dictation along with smaller phrase technology. Any transcriptional errors that result from this process are unintentional.

## 2020-10-19 NOTE — Progress Notes (Signed)
PROGRESS NOTE    Gabriela [name]  YTR:[id number] DOB: 22-Oct-1925 DOA: 10/18/2020 PCP: Jerrol Banana., MD    Assessment & Plan:   Active Problems:   Elevated LFTs   Gabriela [name] is a 87 y.o. 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  She notes associated n/v/d, chills no fever.    Septic shock 2/2 cholangitis  --overnight, developed fever, leukocytosis, low BP MAP<65 despite 2L IVF boluses.   Plan: --transfer to stepdown for low-dose pressor levophed to maintain systolic >10 and MAP >30 --treat cholangitis --cont LR'@100'   Cholangitis Choledocholithiasis  --CT a/p showed "Cholecystectomy with intra and extrahepatic biliary ductal dilatation. There are least 2 intraluminal densities within the distal common bile duct, more proximal measuring 15 mm. This likely represents choledocholithiasis". --worsening LFT's, alk phos, bili and lipase Plan: --start IV zosyn this morning --obtain blood cx x2 --GI consult today, will need to hold ERCP until pt more stable, tentatively tomorrow --Keep NPO  Gallstone pancreatitis --lipase went from 97 to 647 this morning Plan: --cont LR'@100'  --NPO, per GI rec  AKI on CKDIII --due to infection --cont LR'@100'  --low dose pressor Levophed to maintain BP for perfusion  Acute on chronic anemia --Hgb dropped from 8.2 to 6.8 this morning --transfuse 1u pRBC   posterior rectal wall thickening -f/u further gi recs  Mild bladder wall thickening UA with LE -urine culture ordered   ?Esophageal wall thinking  -possible esophagitis /gerd - ppi iv  daily  CAD s/p CABG -continue home regimen   S/p mitral valve repair   Ischemic CMY  -followed by Dr Cammie Sickle --Hold home lasix  Chronic thrombocytopenia -monitor counts   DMII insulin dependent, well controlled -a1c 6.6 --BG q4h --Hold insulin for now  HLD -check lipids  -diet controlled   HTN --Hold home BP meds due to hypotension  Glaucoma -no active issues  -eye drops will need to be restarted once med rec completed  Arthritis/ Failing Right Hip prothesis  -with hx of chronic right thigh wound due to this -followed by out patient orthopedics -supportive care    DVT prophylaxis: Heparin SQ Code Status: Full code  Family Communication: granddaughter POA updated at the bedside today Level of care: Stepdown Dispo:   The patient is from: home Anticipated d/c is to: to be determined Anticipated d/c date is: > 3 days Patient currently is not medically ready to d/c due to: cholangitis, septic shock, still pending ERCP   Subjective and Interval History:  Overnight, pt developed a fever.   This morning, BP dropping, lab showed leukocytosis.  Pt given IV boluses, started on IV zosyn, and transferred to stepdown for pressor due to low BP.  Pt though appeared comfortable, no N/V this morning.  No abdominal pain.     Objective: Vitals:   10/19/20 1700 10/19/20 1745 10/19/20 1800 10/19/20 1815  BP: (!) 95/46 (!) 108/45 (!) 89/65 (!) 111/52  Pulse: 70 68 67 70  Resp: (!) 22 (!) 30 17 (!) 34  Temp:      TempSrc:      SpO2: 100% 99% 100% 99%  Weight:      Height:  Intake/Output Summary (Last 24 hours) at 10/19/2020 1905 Last data filed at 10/19/2020 1441 Gross per 24 hour  Intake 2282.96 ml  Output --  Net 2282.96 ml   Filed Weights   10/18/20 1929  Weight: 50 kg    Examination:   Constitutional: NAD, AAOx3 HEENT: conjunctivae and lids normal, EOMI CV: No cyanosis.   RESP: no distress, slightly elevated RR, on RA Extremities: No  effusions, edema in BLE SKIN: warm, dry Neuro: II - XII grossly intact.   Psych: Normal mood and affect.  Appropriate judgement and reason   Data Reviewed: I have personally reviewed following labs and imaging studies  CBC: Recent Labs  Lab 10/18/20 1933 10/19/20 0439 10/19/20 1750  WBC 7.5 25.0* 28.5*  HGB 8.2* 6.8* 8.0*  HCT 26.1* 21.8* 25.4*  MCV 81.3 80.7 83.6  PLT 105* 91* 79*   Basic Metabolic Panel: Recent Labs  Lab 10/18/20 1933 10/19/20 0439 10/19/20 1750  NA 139 139 136  K 4.1 3.3* 4.1  CL 105 107 109  CO2 21* 20* 15*  GLUCOSE 168* 158* 167*  BUN 56* 58* 59*  CREATININE 1.72* 2.07* 2.20*  CALCIUM 8.9 8.2* 7.3*   GFR: Estimated Creatinine Clearance: 12.9 mL/min (A) (by C-G formula based on SCr of 2.2 mg/dL (H)). Liver Function Tests: Recent Labs  Lab 10/18/20 1933 10/19/20 0439 10/19/20 1750  AST 456* 751* 414*  ALT 163* 357* 317*  ALKPHOS 527* 496* 408*  BILITOT 1.4* 2.5* 3.5*  PROT 8.8* 7.4 7.1  ALBUMIN 4.0 3.2* 3.1*   Recent Labs  Lab 10/18/20 1933 10/19/20 0428  LIPASE 97* 647*   No results for input(s): AMMONIA in the last 168 hours. Coagulation Profile: Recent Labs  Lab 10/19/20 0439  INR 1.3*   Cardiac Enzymes: No results for input(s): CKTOTAL, CKMB, CKMBINDEX, TROPONINI in the last 168 hours. BNP (last 3 results) No results for input(s): PROBNP in the last 8760 hours. HbA1C: Recent Labs    10/19/20 0439  HGBA1C 6.6*   CBG: Recent Labs  Lab 10/19/20 0829 10/19/20 1633  GLUCAP 169* 163*   Lipid Profile: No results for input(s): CHOL, HDL, LDLCALC, TRIG, CHOLHDL, LDLDIRECT in the last 72 hours. Thyroid Function Tests: Recent Labs    10/19/20 0439  TSH 1.006   Anemia Panel: No results for input(s): VITAMINB12, FOLATE, FERRITIN, TIBC, IRON, RETICCTPCT in the last 72 hours. Sepsis Labs: Recent Labs  Lab 10/19/20 0217 10/19/20 0439 10/19/20 1750  PROCALCITON 52.19  --   --   LATICACIDVEN 2.6* 2.4* 2.4*     Recent Results (from the past 240 hour(s))  SARS CORONAVIRUS 2 (TAT 6-24 HRS) Nasopharyngeal Nasopharyngeal Swab     Status: None   Collection Time: 10/18/20  7:26 PM   Specimen: Nasopharyngeal Swab  Result Value Ref Range Status   SARS Coronavirus 2 NEGATIVE NEGATIVE Final    Comment: (NOTE) SARS-CoV-2 target nucleic acids are NOT DETECTED.  The SARS-CoV-2 RNA is generally detectable in upper and lower respiratory specimens during the acute phase of infection. Negative results do not preclude SARS-CoV-2 infection, do not rule out co-infections with other pathogens, and should not be used as the sole basis for treatment or other patient management decisions. Negative results must be combined with clinical observations, patient history, and epidemiological information. The expected result is Negative.  Fact Sheet for Patients: SugarRoll.be  Fact Sheet for Healthcare Providers: https://www.woods-mathews.com/  This test is not yet approved or cleared by the [place] FDA and  has been  authorized for detection and/or diagnosis of SARS-CoV-2 by FDA under an Emergency Use Authorization (EUA). This EUA will remain  in effect (meaning this test can be used) for the duration of the COVID-19 declaration under Se ction 564(b)(1) of the Act, 21 U.S.C. section 360bbb-3(b)(1), unless the authorization is terminated or revoked sooner.  Performed at [facility], [place] [address], [place], [place] [address]   MRSA PCR Screening     Status: None   Collection Time: 10/19/20  4:29 PM   Specimen: Nasal Mucosa; Nasopharyngeal  Result Value Ref Range Status   MRSA by PCR NEGATIVE NEGATIVE Final    Comment:        The GeneXpert MRSA Assay (FDA approved for NASAL specimens only), is one component of a comprehensive MRSA colonization surveillance program. It is not intended to diagnose MRSA infection nor to guide or monitor treatment  for MRSA infections. Performed at [facility], [address] [place],  [address]       Radiology Studies: CT ABDOMEN PELVIS W CONTRAST  Result Date: 10/18/2020 CLINICAL DATA:  87 year old with acute abdominal pain. Elevated LFTs and lipase. Nausea and vomiting. EXAM: CT ABDOMEN AND PELVIS WITH CONTRAST TECHNIQUE: Multidetector CT imaging of the abdomen and pelvis was performed using the standard protocol following bolus administration of intravenous contrast. CONTRAST:  67m OMNIPAQUE IOHEXOL 300 MG/ML  SOLN COMPARISON:  None. FINDINGS: Lower chest: Small hiatal hernia. Motion artifact in the lung bases limits parenchymal evaluation. Coronary artery calcifications. 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 No pleural fluid. Hepatobiliary: Cholecystectomy. Moderate intrahepatic biliary ductal dilatation. Dilated common bile duct at 17 mm. There are least 2 intraluminal densities within the distal common bile duct, more proximal measuring 15 mm. This likely represents choledocholithiasis, but is nonspecific. No focal hepatic lesion. Pancreas: Pancreas is atrophic. Mild proximal pancreatic ductal dilatation at 5 mm. No definite peripancreatic fat stranding. No discrete pancreatic mass, although evaluation is limited by motion. Spleen: Normal in size without focal abnormality. Adrenals/Urinary Tract: Normal adrenal glands. Bilateral extrarenal pelvis configuration of both kidneys. No perinephric edema. No focal renal abnormality. Probable punctate nonobstructing stone in the upper left kidney. There may be mild bladder wall thickening. Stomach/Bowel: Small hiatal hernia. Mild distal esophageal wall thickening. Stomach otherwise unremarkable. No small bowel obstruction or inflammation. Normal appendix. Majority of the colon is decompressed. No colonic inflammation. Questionable posterior rectal wall thickening measuring up to 2.1 cm. Vascular/Lymphatic: Moderate aortic and branch atherosclerosis. No  aortic aneurysm. No enlarged lymph nodes in the abdomen or pelvis. Reproductive: Hysterectomy without adnexal mass. Other: No abdominal ascites.  No free air or focal fluid collection. Musculoskeletal: Right hip arthroplasty. Lobulated lucency adjacent to the acetabular cup, series 2, image 63 with moderate joint effusion. Advanced left hip osteoarthritis. Multilevel degenerative change in the spine. IMPRESSION: 1. Cholecystectomy with intra and extrahepatic biliary ductal dilatation. There are least 2 intraluminal densities within the distal common bile duct, more proximal measuring 15 mm. This likely represents choledocholithiasis, but is nonspecific. The possibility of polyp or mass is not excluded. Recommend further evaluation with ERCP. MRCP is not recommended in this patient given inability to stay still for CT. 2. Mild proximal pancreatic ductal dilatation at 5 mm, no discrete pancreatic mass, although evaluation is limited by motion. 3. Questionable posterior rectal wall thickening measuring up to 2.1 cm. Recommend correlation with physical exam. 4. Right hip arthroplasty with lucency adjacent to the acetabular cup, most consistent with particle disease. Moderate joint effusion. Findings were also seen  on 01/17/2016 pelvis CT. 5. Small hiatal hernia with mild distal esophageal wall thickening, can be seen with reflux or esophagitis. 6. Possible mild bladder wall thickening. 7. Possible punctate nonobstructing stone in the upper left kidney. Aortic Atherosclerosis (ICD10-I70.0). Electronically Signed   By: Keith Rake M.D.   On: 10/18/2020 22:02   DG Chest Port 1 View  Result Date: 10/19/2020 CLINICAL DATA:  Shortness of breath for 2 days EXAM: PORTABLE CHEST 1 VIEW COMPARISON:  05/19/2018 FINDINGS: Cardiac shadow is enlarged but stable. Postsurgical changes are again seen. Mild vascular congestion is noted without significant interstitial edema. No focal infiltrate is noted. IMPRESSION: Early CHF  without significant edema. Electronically Signed   By: Inez Catalina M.D.   On: 10/19/2020 01:08     Scheduled Meds: . Chlorhexidine Gluconate Cloth  6 each Topical Daily  . heparin  5,000 Units Subcutaneous Q8H  . pantoprazole (PROTONIX) IV  40 mg Intravenous Q24H   Continuous Infusions: . sodium chloride    . norepinephrine (LEVOPHED) Adult infusion Stopped (10/19/20 1749)  . piperacillin-tazobactam (ZOSYN)  IV 2.25 g (10/19/20 1118)     LOS: 1 day     Enzo Bi, MD [facility] If 7PM-7AM, please contact night-coverage 10/19/2020, 7:05 PM

## 2020-10-19 NOTE — Consult Note (Signed)
Pharmacy Antibiotic Note  Gabriela [name] is a 87 y.o. female admitted on 10/18/2020 with intra-abdominal infection.    T max 24h 101.72F.  Pharmacy has been consulted for Zosyn dosing.  Plan: Will start Zosyn 2.25g q6h   Height: 5\' 7"  (170.2 cm) Weight: 50 kg (110 lb 3.7 oz) IBW/kg (Calculated) : 61.6  Temp (24hrs), Avg:99.1 F (37.3 C), Min:97.7 F (36.5 C), Max:101.4 F (38.6 C)  Recent Labs  Lab 10/18/20 1933 10/19/20 0217 10/19/20 0439  WBC 7.5  --  25.0*  CREATININE 1.72*  --  2.07*  LATICACIDVEN  --  2.6* 2.4*    Estimated Creatinine Clearance: 13.7 mL/min (A) (by C-G formula based on SCr of 2.07 mg/dL (H)).    Allergies  Allergen Reactions  . Sulfa Antibiotics Rash    Antimicrobials this admission: Zosyn 3/7 >>  Dose adjustments this admission: n/a  Microbiology results: COVID NEG  Thank you for allowing pharmacy to be a part of this patient's care.  Lu Duffel, PharmD, BCPS Clinical Pharmacist 10/19/2020 9:44 AM

## 2020-10-19 NOTE — Progress Notes (Signed)
PT Cancellation Note  Patient Details Name: Gabriela [name] MRN: [id number] DOB: 07/01/1926   Cancelled Treatment:    Reason Eval/Treat Not Completed: Medical issues which prohibited therapy (Per chart review, patient transferred to CCU due to change in medical status (possible need for pressor support). Per guidelines will require new PT orders to resume services. Please re-consult as medically appropriate.)  Jamiee Milholland H. Owens Shark, PT, DPT, NCS 10/19/20, 9:19 PM [phone]

## 2020-10-19 NOTE — Progress Notes (Signed)
PT Cancellation Note  Patient Details Name: Gabriela [name] MRN: [id number] DOB: 1926-06-10   Cancelled Treatment:    Reason Eval/Treat Not Completed: Medical issues which prohibited therapy (Consult received and chart reviewed. Patient noted with current HgB (6.8), downtrending from previous date; plans for transfusion noted.  Will hold PT eval at this time and re-attempt at later time/date as medically appropriate and available.)   Treyson Axel H. Owens Shark, PT, DPT, NCS 10/19/20, 10:47 AM [phone]

## 2020-10-19 NOTE — Progress Notes (Signed)
Patient has temp of 101.4. on-call hospitalist notified.  Orders given to administer bolus.  Unfortunately PIV has dislodged. Nurse has tried on 2 attempts and now will notify IV team for insertion of PIV.

## 2020-10-20 ENCOUNTER — Encounter
Admission: EM | Disposition: A | Discharge status: Home Health | Payer: Self-pay | Source: Home / Self Care | Attending: Hospitalist

## 2020-10-20 ENCOUNTER — Telehealth: Payer: Self-pay

## 2020-10-20 ENCOUNTER — Inpatient Hospital Stay: Payer: Medicare Other | Admitting: Registered Nurse

## 2020-10-20 ENCOUNTER — Other Ambulatory Visit: Payer: Self-pay

## 2020-10-20 ENCOUNTER — Encounter: Payer: Self-pay | Admitting: Internal Medicine

## 2020-10-20 ENCOUNTER — Inpatient Hospital Stay: Payer: Medicare Other

## 2020-10-20 DIAGNOSIS — K8050 Calculus of bile duct without cholangitis or cholecystitis without obstruction: Secondary | ICD-10-CM | POA: Diagnosis not present

## 2020-10-20 DIAGNOSIS — K838 Other specified diseases of biliary tract: Secondary | ICD-10-CM

## 2020-10-20 DIAGNOSIS — R932 Abnormal findings on diagnostic imaging of liver and biliary tract: Secondary | ICD-10-CM | POA: Diagnosis not present

## 2020-10-20 HISTORY — PX: ERCP: SHX5425

## 2020-10-20 LAB — CBC
HCT: 24.9 % — ABNORMAL LOW (ref 36.0–46.0)
Hemoglobin: 8.5 g/dL — ABNORMAL LOW (ref 12.0–15.0)
MCH: 26.8 pg (ref 26.0–34.0)
MCHC: 34.1 g/dL (ref 30.0–36.0)
MCV: 78.5 fL — ABNORMAL LOW (ref 80.0–100.0)
Platelets: 80 10*3/uL — ABNORMAL LOW (ref 150–400)
RBC: 3.17 MIL/uL — ABNORMAL LOW (ref 3.87–5.11)
RDW: 17.4 % — ABNORMAL HIGH (ref 11.5–15.5)
WBC: 23.3 10*3/uL — ABNORMAL HIGH (ref 4.0–10.5)
nRBC: 0 % (ref 0.0–0.2)

## 2020-10-20 LAB — LACTIC ACID, PLASMA: Lactic Acid, Venous: 1.4 mmol/L (ref 0.5–1.9)

## 2020-10-20 LAB — TYPE AND SCREEN
ABO/RH(D): O POS
Antibody Screen: NEGATIVE
Unit division: 0

## 2020-10-20 LAB — COMPREHENSIVE METABOLIC PANEL
ALT: 241 U/L — ABNORMAL HIGH (ref 0–44)
AST: 273 U/L — ABNORMAL HIGH (ref 15–41)
Albumin: 2.7 g/dL — ABNORMAL LOW (ref 3.5–5.0)
Alkaline Phosphatase: 374 U/L — ABNORMAL HIGH (ref 38–126)
Anion gap: 10 (ref 5–15)
BUN: 55 mg/dL — ABNORMAL HIGH (ref 8–23)
CO2: 18 mmol/L — ABNORMAL LOW (ref 22–32)
Calcium: 7.6 mg/dL — ABNORMAL LOW (ref 8.9–10.3)
Chloride: 112 mmol/L — ABNORMAL HIGH (ref 98–111)
Creatinine, Ser: 1.9 mg/dL — ABNORMAL HIGH (ref 0.44–1.00)
GFR, Estimated: 24 mL/min — ABNORMAL LOW (ref >60)
Glucose, Bld: 102 mg/dL — ABNORMAL HIGH (ref 70–99)
Potassium: 3.5 mmol/L (ref 3.5–5.1)
Sodium: 140 mmol/L (ref 135–145)
Total Bilirubin: 3.5 mg/dL — ABNORMAL HIGH (ref 0.3–1.2)
Total Protein: 6.5 g/dL (ref 6.5–8.1)

## 2020-10-20 LAB — IRON AND TIBC
Iron: 36 ug/dL (ref 28–170)
Saturation Ratios: 20 % (ref 10.4–31.8)
TIBC: 185 ug/dL — ABNORMAL LOW (ref 250–450)
UIBC: 149 ug/dL

## 2020-10-20 LAB — GLUCOSE, CAPILLARY
Glucose-Capillary: 86 mg/dL (ref 70–99)
Glucose-Capillary: 93 mg/dL (ref 70–99)
Glucose-Capillary: 123 mg/dL — ABNORMAL HIGH (ref 70–99)
Glucose-Capillary: 107 mg/dL — ABNORMAL HIGH (ref 70–99)

## 2020-10-20 LAB — BPAM RBC
Blood Product Expiration Date: 202204062359
ISSUE DATE / TIME: 202203071411
Unit Type and Rh: 5100

## 2020-10-20 LAB — LIPID PANEL
Cholesterol: 70 mg/dL (ref 0–200)
HDL: 15 mg/dL — ABNORMAL LOW (ref >40)
LDL Cholesterol: 34 mg/dL (ref 0–99)
Total CHOL/HDL Ratio: 4.7 RATIO
Triglycerides: 106 mg/dL (ref <150)
VLDL: 21 mg/dL (ref 0–40)

## 2020-10-20 LAB — VITAMIN B12: Vitamin B-12: 666 pg/mL (ref 180–914)

## 2020-10-20 LAB — MAGNESIUM: Magnesium: 1.8 mg/dL (ref 1.7–2.4)

## 2020-10-20 LAB — FOLATE: Folate: 12.1 ng/mL (ref >5.9)

## 2020-10-20 LAB — LIPASE, BLOOD: Lipase: 117 U/L — ABNORMAL HIGH (ref 11–51)

## 2020-10-20 SURGERY — ERCP, WITH INTERVENTION IF INDICATED
Anesthesia: General

## 2020-10-20 MED ORDER — INDOMETHACIN 50 MG RE SUPP
RECTAL | Status: AC
  Administered 2020-10-20: 100 mg via RECTAL
  Filled 2020-10-20: qty 2

## 2020-10-20 MED ORDER — LIDOCAINE 2% (20 MG/ML) 5 ML SYRINGE
INTRAMUSCULAR | Status: DC | PRN
  Administered 2020-10-20: 25 mg via INTRAVENOUS

## 2020-10-20 MED ORDER — TIMOLOL MALEATE 0.5 % OP SOLN
1.0000 [drp] | OPHTHALMIC | Status: DC
  Administered 2020-10-20 – 2020-10-22 (×3): 1 [drp] via OPHTHALMIC
  Filled 2020-10-20 (×2): qty 5

## 2020-10-20 MED ORDER — ASPIRIN EC 81 MG PO TBEC
81.0000 mg | DELAYED_RELEASE_TABLET | Freq: Every day | ORAL | Status: DC
  Administered 2020-10-21 – 2020-10-22 (×2): 81 mg via ORAL
  Filled 2020-10-20 (×2): qty 1

## 2020-10-20 MED ORDER — PIPERACILLIN-TAZOBACTAM 3.375 G IVPB
3.3750 g | Freq: Three times a day (TID) | INTRAVENOUS | Status: DC
  Administered 2020-10-20 – 2020-10-21 (×3): 3.375 g via INTRAVENOUS
  Filled 2020-10-20 (×3): qty 50

## 2020-10-20 MED ORDER — INDOMETHACIN 50 MG RE SUPP
100.0000 mg | Freq: Once | RECTAL | Status: AC
  Filled 2020-10-20: qty 2

## 2020-10-20 MED ORDER — PROPOFOL 500 MG/50ML IV EMUL
INTRAVENOUS | Status: DC | PRN
  Administered 2020-10-20: 100 ug/kg/min via INTRAVENOUS

## 2020-10-20 MED ORDER — PROPOFOL 10 MG/ML IV BOLUS
INTRAVENOUS | Status: DC | PRN
  Administered 2020-10-20: 50 mg via INTRAVENOUS
  Administered 2020-10-20: 20 mg via INTRAVENOUS

## 2020-10-20 MED ORDER — SODIUM CHLORIDE 0.9 % IV SOLN: INTRAVENOUS | Status: DC

## 2020-10-20 MED ORDER — EPHEDRINE SULFATE 50 MG/ML IJ SOLN
INTRAMUSCULAR | Status: DC | PRN
  Administered 2020-10-20: 10 mg via INTRAVENOUS

## 2020-10-20 NOTE — Progress Notes (Signed)
Dr. Marius Ditch gave order for clear liquid diet.

## 2020-10-20 NOTE — Progress Notes (Signed)
Gabriela Darby, MD [address]  [place]  [place], [place] [address]  Main: [phone]  Fax: [phone] Pager: [phone]   Subjective: Patient is transferred to ICU yesterday evening due to septic shock.  She required Levophed along with IV fluids.  She received 2 units of PRBCs.  She reports mild epigastric discomfort.  Granddaughter is bedside.  Has been afebrile.  Her blood pressure has improved and patient is off Levophed this morning.   Objective: Vital signs in last 24 hours: Vitals:   10/20/20 1030 10/20/20 1045 10/20/20 1100 10/20/20 1119  BP: (!) 120/55 97/82 137/63 134/86  Pulse: 64 66 63 69  Resp: (!) 23 (!) 24 (!) 25 18  Temp:    97.7 F (36.5 C)  TempSrc:    Temporal  SpO2: 100% 100% 100% 100%  Weight:    60.8 kg  Height:    _0  (1.626 m)   Weight change:   Intake/Output Summary (Last 24 hours) at 10/20/2020 1146 Last data filed at 10/20/2020 1000 Gross per 24 hour  Intake 2592.19 ml  Output 500 ml  Net 2092.19 ml     Exam: Heart:: Regular rate and rhythm, S1S2 present or without murmur or extra heart sounds Lungs: normal and clear to auscultation Abdomen: Soft, mild epigastric tenderness, nondistended   Lab Results: CBC Latest Ref Rng & Units 10/20/2020 10/19/2020 10/19/2020  WBC 4.0 - 10.5 K/uL 23.3(H) 28.5(H) 25.0(H)  Hemoglobin 12.0 - 15.0 g/dL 8.5(L) 8.0(L) 6.8(L)  Hematocrit 36.0 - 46.0 % 24.9(L) 25.4(L) 21.8(L)  Platelets 150 - 400 K/uL 80(L) 79(L) 91(L)   CMP Latest Ref Rng & Units 10/20/2020 10/19/2020 10/19/2020  Glucose 70 - 99 mg/dL 102(H) 167(H) 158(H)  BUN 8 - 23 mg/dL 55(H) 59(H) 58(H)  Creatinine 0.44 - 1.00 mg/dL 1.90(H) 2.20(H) 2.07(H)  Sodium 135 - 145 mmol/L 140 136 139  Potassium 3.5 - 5.1 mmol/L 3.5 4.1 3.3(L)  Chloride 98 - 111 mmol/L 112(H) 109 107  CO2 22 - 32 mmol/L 18(L) 15(L) 20(L)  Calcium 8.9 - 10.3 mg/dL 7.6(L) 7.3(L) 8.2(L)  Total Protein 6.5 - 8.1 g/dL 6.5 7.1 7.4  Total Bilirubin 0.3 - 1.2 mg/dL 3.5(H)  3.5(H) 2.5(H)  Alkaline Phos 38 - 126 U/L 374(H) 408(H) 496(H)  AST 15 - 41 U/L 273(H) 414(H) 751(H)  ALT 0 - 44 U/L 241(H) 317(H) 357(H)    Micro Results: Recent Results (from the past 240 hour(s))  SARS CORONAVIRUS 2 (TAT 6-24 HRS) Nasopharyngeal Nasopharyngeal Swab     Status: None   Collection Time: 10/18/20  7:26 PM   Specimen: Nasopharyngeal Swab  Result Value Ref Range Status   SARS Coronavirus 2 NEGATIVE NEGATIVE Final    Comment: (NOTE) SARS-CoV-2 target nucleic acids are NOT DETECTED.  The SARS-CoV-2 RNA is generally detectable in upper and lower respiratory specimens during the acute phase of infection. Negative results do not preclude SARS-CoV-2 infection, do not rule out co-infections with other pathogens, and should not be used as the sole basis for treatment or other patient management decisions. Negative results must be combined with clinical observations, patient history, and epidemiological information. The expected result is Negative.  Fact Sheet for Patients: SugarRoll.be  Fact Sheet for Healthcare Providers: https://www.woods-mathews.com/  This test is not yet approved or cleared by the [place] FDA and  has been authorized for detection and/or diagnosis of SARS-CoV-2 by FDA under an Emergency Use Authorization (EUA). This EUA will remain  in effect (meaning this test can  be used) for the duration of the COVID-19 declaration under Se ction 564(b)(1) of the Act, 21 U.S.C. section 360bbb-3(b)(1), unless the authorization is terminated or revoked sooner.  Performed at [facility], [place] [address] [place], [place] [address]   MRSA PCR Screening     Status: None   Collection Time: 10/19/20  4:29 PM   Specimen: Nasal Mucosa; Nasopharyngeal  Result Value Ref Range Status   MRSA by PCR NEGATIVE NEGATIVE Final    Comment:        The GeneXpert MRSA Assay (FDA approved for NASAL specimens only), is one  component of a comprehensive MRSA colonization surveillance program. It is not intended to diagnose MRSA infection nor to guide or monitor treatment for MRSA infections. Performed at [facility], [address] [place], [place] [address]   Culture, blood (routine x 2)     Status: None (Preliminary result)   Collection Time: 10/19/20  5:49 PM   Specimen: BLOOD  Result Value Ref Range Status   Specimen Description BLOOD BLOOD LEFT HAND  Final   Special Requests   Final    BOTTLES DRAWN AEROBIC AND ANAEROBIC Blood Culture adequate volume   Culture   Final    NO GROWTH < 24 HOURS Performed at [facility], [address] [place], [place] [address]    Report Status PENDING  Incomplete  Culture, blood (routine x 2)     Status: None (Preliminary result)   Collection Time: 10/19/20  6:53 PM   Specimen: BLOOD  Result Value Ref Range Status   Specimen Description BLOOD BLOOD RIGHT HAND  Final   Special Requests   Final    BOTTLES DRAWN AEROBIC ONLY Blood Culture adequate volume   Culture   Final    NO GROWTH < 12 HOURS Performed at [facility], [address] [place], [place] [address]    Report Status PENDING  Incomplete   Studies/Results: CT ABDOMEN PELVIS W CONTRAST  Result Date: 10/18/2020 CLINICAL DATA:  87 year old with acute abdominal pain. Elevated LFTs and lipase. Nausea and vomiting. EXAM: CT ABDOMEN AND PELVIS WITH CONTRAST TECHNIQUE: Multidetector CT imaging of the abdomen and pelvis was performed using the standard protocol following bolus administration of intravenous contrast. CONTRAST:  24m OMNIPAQUE IOHEXOL 300 MG/ML  SOLN COMPARISON:  None. FINDINGS: Lower chest: Small hiatal hernia. Motion artifact in the lung bases limits parenchymal evaluation. Coronary artery calcifications. 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 No pleural fluid. Hepatobiliary: Cholecystectomy. Moderate intrahepatic biliary ductal dilatation. Dilated common bile duct at  17 mm. There are least 2 intraluminal densities within the distal common bile duct, more proximal measuring 15 mm. This likely represents choledocholithiasis, but is nonspecific. No focal hepatic lesion. Pancreas: Pancreas is atrophic. Mild proximal pancreatic ductal dilatation at 5 mm. No definite peripancreatic fat stranding. No discrete pancreatic mass, although evaluation is limited by motion. Spleen: Normal in size without focal abnormality. Adrenals/Urinary Tract: Normal adrenal glands. Bilateral extrarenal pelvis configuration of both kidneys. No perinephric edema. No focal renal abnormality. Probable punctate nonobstructing stone in the upper left kidney. There may be mild bladder wall thickening. Stomach/Bowel: Small hiatal hernia. Mild distal esophageal wall thickening. Stomach otherwise unremarkable. No small bowel obstruction or inflammation. Normal appendix. Majority of the colon is decompressed. No colonic inflammation. Questionable posterior rectal wall thickening measuring up to 2.1 cm. Vascular/Lymphatic: Moderate aortic and branch atherosclerosis. No aortic aneurysm. No enlarged lymph nodes in the abdomen or pelvis. Reproductive: Hysterectomy without adnexal mass. Other: No  abdominal ascites.  No free air or focal fluid collection. Musculoskeletal: Right hip arthroplasty. Lobulated lucency adjacent to the acetabular cup, series 2, image 63 with moderate joint effusion. Advanced left hip osteoarthritis. Multilevel degenerative change in the spine. IMPRESSION: 1. Cholecystectomy with intra and extrahepatic biliary ductal dilatation. There are least 2 intraluminal densities within the distal common bile duct, more proximal measuring 15 mm. This likely represents choledocholithiasis, but is nonspecific. The possibility of polyp or mass is not excluded. Recommend further evaluation with ERCP. MRCP is not recommended in this patient given inability to stay still for CT. 2. Mild proximal pancreatic ductal  dilatation at 5 mm, no discrete pancreatic mass, although evaluation is limited by motion. 3. Questionable posterior rectal wall thickening measuring up to 2.1 cm. Recommend correlation with physical exam. 4. Right hip arthroplasty with lucency adjacent to the acetabular cup, most consistent with particle disease. Moderate joint effusion. Findings were also seen on 01/17/2016 pelvis CT. 5. Small hiatal hernia with mild distal esophageal wall thickening, can be seen with reflux or esophagitis. 6. Possible mild bladder wall thickening. 7. Possible punctate nonobstructing stone in the upper left kidney. Aortic Atherosclerosis (ICD10-I70.0). Electronically Signed   By: Keith Rake M.D.   On: 10/18/2020 22:02   DG Chest Port 1 View  Result Date: 10/19/2020 CLINICAL DATA:  Shortness of breath for 2 days EXAM: PORTABLE CHEST 1 VIEW COMPARISON:  05/19/2018 FINDINGS: Cardiac shadow is enlarged but stable. Postsurgical changes are again seen. Mild vascular congestion is noted without significant interstitial edema. No focal infiltrate is noted. IMPRESSION: Early CHF without significant edema. Electronically Signed   By: Inez Catalina M.D.   On: 10/19/2020 01:08   Medications:  I have reviewed the patient's current medications. Prior to Admission:  Medications Prior to Admission  Medication Sig Dispense Refill Last Dose  . ferrous sulfate 325 (65 FE) MG tablet TAKE 1 TABLET BY MOUTH EVERY OTHER DAY 45 tablet 2   . furosemide (LASIX) 20 MG tablet TAKE 1 TABLET BY MOUTH EVERY DAY (Patient taking differently: Take 20 mg by mouth daily.) 90 tablet 0   . hydrochlorothiazide (HYDRODIURIL) 25 MG tablet Take 1 tablet (25 mg total) by mouth daily. 90 tablet 1   . LEVEMIR FLEXTOUCH 100 UNIT/ML FlexPen INJECT 15 UNITS INTO THE SKIN DAILY. IN THE AFTERNOON 15 mL 5   . metoprolol tartrate (LOPRESSOR) 25 MG tablet TAKE 1/2 TABLET BY MOUTH DAILY 45 tablet 0   . timolol (TIMOPTIC) 0.5 % ophthalmic solution Place 1 drop into  both eyes every morning.     Marland Kitchen ACCU-CHEK AVIVA PLUS test strip USE TO TEST SUGAR TWICE A DAY 100 strip 1   . ACCU-CHEK FASTCLIX LANCETS MISC To check blood sugars once a day. DX E11.9 102 each 12   . aspirin EC 81 MG tablet Take 81 mg by mouth daily.     . Blood Glucose Monitoring Suppl (ACCU-CHEK AVIVA PLUS) w/Device KIT 1 each by Does not apply route daily. Dx E11.9 1 kit 0   . Insulin Pen Needle (B-D UF III MINI PEN NEEDLES) 31G X 5 MM MISC USE WITH INSULIN DOSES 100 each 5    Scheduled: . [MAR Hold] Chlorhexidine Gluconate Cloth  6 each Topical Daily  . [MAR Hold] heparin  5,000 Units Subcutaneous Q8H  . [MAR Hold] pantoprazole (PROTONIX) IV  40 mg Intravenous Q24H   Continuous: . sodium chloride Stopped (10/19/20 1944)  . sodium chloride    . lactated ringers 50 mL/hr  at 10/20/20 1000  . [MAR Hold] norepinephrine (LEVOPHED) Adult infusion Stopped (10/20/20 0736)  . [MAR Hold] piperacillin-tazobactam (ZOSYN)  IV     PRN:[MAR Hold] albuterol, [MAR Hold]  morphine injection, [MAR Hold] ondansetron **OR** [MAR Hold] ondansetron (ZOFRAN) IV, [MAR Hold] oxyCODONE Anti-infectives (From admission, onward)   Start     Dose/Rate Route Frequency Ordered Stop   10/20/20 1400  [MAR Hold]  piperacillin-tazobactam (ZOSYN) IVPB 3.375 g        (MAR Hold since Tue 10/20/2020 at 1105.Hold Reason: Transfer to a Procedural area.)   3.375 g 12.5 mL/hr over 240 Minutes Intravenous Every 8 hours 10/20/20 0755     10/19/20 1030  piperacillin-tazobactam (ZOSYN) IVPB 2.25 g  Status:  Discontinued        2.25 g 100 mL/hr over 30 Minutes Intravenous Every 6 hours 10/19/20 0942 10/20/20 0755     Scheduled Meds: . [MAR Hold] Chlorhexidine Gluconate Cloth  6 each Topical Daily  . [MAR Hold] heparin  5,000 Units Subcutaneous Q8H  . indomethacin  100 mg Rectal Once  . indomethacin      . [MAR Hold] pantoprazole (PROTONIX) IV  40 mg Intravenous Q24H   Continuous Infusions: . sodium chloride Stopped (10/19/20  1944)  . sodium chloride    . lactated ringers 50 mL/hr at 10/20/20 1000  . [MAR Hold] norepinephrine (LEVOPHED) Adult infusion Stopped (10/20/20 0736)  . [MAR Hold] piperacillin-tazobactam (ZOSYN)  IV     PRN Meds:.[MAR Hold] albuterol, [MAR Hold]  morphine injection, [MAR Hold] ondansetron **OR** [MAR Hold] ondansetron (ZOFRAN) IV, [MAR Hold] oxyCODONE   Assessment: Active Problems:   Elevated LFTs  Plan: Choledocholithiasis with ascending cholangitis and gallstone pancreatitis Continue IV Zosyn Maintain n.p.o. Follow-up blood cultures Monitor LFTs closely Plan for ERCP today as patient is adequately resuscitated and has been hemodynamically stable, off pressors.  Case discussed with anesthesiology today  Anemia and thrombocytopenia: Secondary to worsening kidney function as well as in the setting of sepsis No evidence of GI bleed S/p 2 units of PRBCs, responded appropriately   LOS: 2 days   Tyrome Donatelli 10/20/2020, 11:46 AM

## 2020-10-20 NOTE — Progress Notes (Signed)
Pt remains on 1 of Levo and LR (100 ml/hr) overnight. Attempted to stop Levo overnight w/o success. Pt remains Ox4. Purewick collected 500 urine output overnight. Pt continues to receive IV Zosyn. Pt in no acute distress @ this time. Will continue to monitor.

## 2020-10-20 NOTE — Progress Notes (Signed)
PROGRESS NOTE    Gabriela [name]  RNH:[id number] DOB: [birth year] DOA: 10/18/2020 PCP: Jerrol Banana., MD    Assessment & Plan:   Active Problems:   Elevated LFTs   Choledocholithiasis   Common bile duct dilatation   Abnormal findings on imaging of biliary tract   Gabriela [name] is a 87 y.o. 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  She notes associated n/v/d, chills no fever.    Septic shock 2/2 cholangitis, resolved --overnight, developed fever, leukocytosis, low BP MAP<65 despite 2L IVF boluses.   --transferred to stepdown on 3/7 for low-dose pressor levophed, weaned off morning of 3/8. Plan: --treat cholangitis --d/c MIVF  Cholangitis Choledocholithiasis  --CT a/p showed "Cholecystectomy with intra and extrahepatic biliary ductal dilatation. There are least 2 intraluminal densities within the distal common bile duct, more proximal measuring 15 mm. This likely represents choledocholithiasis". --worsening LFT's, alk phos, bili and lipase Plan: --ERCP today, received a stent to common bile duct --cont IV zosyn --clear liquid diet for now, advance as tolerated --ERCP in 2 months for stent removal.  Gallstone pancreatitis --lipase went from 97 to 647 next day, back down to 117 with MIVF. Plan: --d/c MIVF --clear liquid diet for now, advance as tolerated  AKI on CKDIII --due to infection and hypotension --Cr improved with MIVF and low-dose pressor --d/c MIVF and encourage oral hydration  Acute on chronic anemia --Hgb dropped from 8.2 to 6.8 next morning --transfused 1u  pRBC Plan: --Monitor Hgb and transfuse to keep Hgb >7 --anemia workup  possible esophagitis, ruled out --upper GI track grossly normal seen during ERCP --d/c IV PPI (started on admission)  CAD s/p CABG --cont home ASA --hold home metop for now due to hypotension  S/p mitral valve repair   Ischemic CMY  -followed by Dr Cammie Sickle --Hold home lasix and metop for now due to hypotension  Chronic thrombocytopenia -monitor counts   DMII insulin dependent, well controlled -a1c 6.6 --BG has been within acceptable range --d/c BG q4h checks --Hold insulin for now  HLD -check lipids   HTN --Hold home BP meds due to hypotension  Glaucoma -no active issues  --cont home eye drops  Arthritis/ Failing Right Hip prothesis  -with hx of chronic right thigh wound due to this -followed by out patient orthopedics -supportive care    DVT prophylaxis: Heparin SQ Code Status: Full code  Family Communication: granddaughter POA updated by GI doc today Level of care: Med-Surg Dispo:   The patient is from: home Anticipated d/c is to: likely home Anticipated d/c date is: 2-3 days Patient currently is not medically ready to d/c due to: cholangitis, just had ERCP, on IV zosyn   Subjective and Interval History:  Pt was weaned off levo this morning.  BP stable.  Pt went for ERCP today and received a stent in common bile duct.  Pt tolerated it well, and already had clear liquid diet afterwards.  No pain.  No N/V.    Transfer order in to transfer out of stepdown.   Objective: Vitals:   10/20/20 1345 10/20/20 1400 10/20/20 1430 10/20/20 1445  BP:   (!) 159/62  125/76  Pulse: 61 64 66 64  Resp: (!) 23 17 (!) 27 (!) 24  Temp:      TempSrc:      SpO2: 100% 100% 100% 99%  Weight:      Height:        Intake/Output Summary (Last 24 hours) at 10/20/2020 1539 Last data filed at 10/20/2020 1400 Gross per 24 hour  Intake 1638.64 ml  Output 850 ml  Net 788.64 ml   Filed Weights    10/18/20 1929 10/20/20 1119  Weight: 50 kg 60.8 kg    Examination:   Constitutional: NAD, AAOx3, smiling HEENT: conjunctivae and lids normal, EOMI CV: No cyanosis.   RESP: normal respiratory effort, on RA Extremities: No effusions, edema in BLE SKIN: warm, dry Neuro: II - XII grossly intact.   Psych: Normal mood and affect.  Appropriate judgement and reason   Data Reviewed: I have personally reviewed following labs and imaging studies  CBC: Recent Labs  Lab 10/18/20 1933 10/19/20 0439 10/19/20 1750 10/20/20 0443  WBC 7.5 25.0* 28.5* 23.3*  HGB 8.2* 6.8* 8.0* 8.5*  HCT 26.1* 21.8* 25.4* 24.9*  MCV 81.3 80.7 83.6 78.5*  PLT 105* 91* 79* 80*   Basic Metabolic Panel: Recent Labs  Lab 10/18/20 1933 10/19/20 0439 10/19/20 1750 10/20/20 0443  NA 139 139 136 140  K 4.1 3.3* 4.1 3.5  CL 105 107 109 112*  CO2 21* 20* 15* 18*  GLUCOSE 168* 158* 167* 102*  BUN 56* 58* 59* 55*  CREATININE 1.72* 2.07* 2.20* 1.90*  CALCIUM 8.9 8.2* 7.3* 7.6*  MG  --   --   --  1.8   GFR: Estimated Creatinine Clearance: 16.3 mL/min (A) (by C-G formula based on SCr of 1.9 mg/dL (H)). Liver Function Tests: Recent Labs  Lab 10/18/20 1933 10/19/20 0439 10/19/20 1750 10/20/20 0443  AST 456* 751* 414* 273*  ALT 163* 357* 317* 241*  ALKPHOS 527* 496* 408* 374*  BILITOT 1.4* 2.5* 3.5* 3.5*  PROT 8.8* 7.4 7.1 6.5  ALBUMIN 4.0 3.2* 3.1* 2.7*   Recent Labs  Lab 10/18/20 1933 10/19/20 0428 10/20/20 0443  LIPASE 97* 647* 117*   No results for input(s): AMMONIA in the last 168 hours. Coagulation Profile: Recent Labs  Lab 10/19/20 0439  INR 1.3*   Cardiac Enzymes: No results for input(s): CKTOTAL, CKMB, CKMBINDEX, TROPONINI in the last 168 hours. BNP (last 3 results) No results for input(s): PROBNP in the last 8760 hours. HbA1C: Recent Labs    10/19/20 0439  HGBA1C 6.6*   CBG: Recent Labs  Lab 10/19/20 1633 10/19/20 1937 10/19/20 2336 10/20/20 0343 10/20/20 0733   GLUCAP 163* 153* 130* 86 93   Lipid Profile: No results for input(s): CHOL, HDL, LDLCALC, TRIG, CHOLHDL, LDLDIRECT in the last 72 hours. Thyroid Function Tests: Recent Labs    10/19/20 0439  TSH 1.006   Anemia Panel: No results for input(s): VITAMINB12, FOLATE, FERRITIN, TIBC, IRON, RETICCTPCT in the last 72 hours. Sepsis Labs: Recent Labs  Lab 10/19/20 0217 10/19/20 0439 10/19/20 1750 10/20/20 1457  PROCALCITON 52.19  --   --   --   LATICACIDVEN 2.6* 2.4* 2.4* 1.4    Recent Results (from the past 240 hour(s))  SARS CORONAVIRUS 2 (TAT 6-24 HRS) Nasopharyngeal Nasopharyngeal Swab     Status: None   Collection Time: 10/18/20  7:26 PM   Specimen: Nasopharyngeal Swab  Result Value Ref Range Status   SARS Coronavirus 2 NEGATIVE NEGATIVE Final  Comment: (NOTE) SARS-CoV-2 target nucleic acids are NOT DETECTED.  The SARS-CoV-2 RNA is generally detectable in upper and lower respiratory specimens during the acute phase of infection. Negative results do not preclude SARS-CoV-2 infection, do not rule out co-infections with other pathogens, and should not be used as the sole basis for treatment or other patient management decisions. Negative results must be combined with clinical observations, patient history, and epidemiological information. The expected result is Negative.  Fact Sheet for Patients: SugarRoll.be  Fact Sheet for Healthcare Providers: https://www.woods-mathews.com/  This test is not yet approved or cleared by the [place] FDA and  has been authorized for detection and/or diagnosis of SARS-CoV-2 by FDA under an Emergency Use Authorization (EUA). This EUA will remain  in effect (meaning this test can be used) for the duration of the COVID-19 declaration under Se ction 564(b)(1) of the Act, 21 U.S.C. section 360bbb-3(b)(1), unless the authorization is terminated or revoked sooner.  Performed at [facility], [place] [address] [place], [place] [address]   MRSA PCR Screening     Status: None   Collection Time: 10/19/20  4:29 PM   Specimen: Nasal Mucosa; Nasopharyngeal  Result Value Ref Range Status   MRSA by PCR NEGATIVE NEGATIVE Final    Comment:        The GeneXpert MRSA Assay (FDA approved for NASAL specimens only), is one component of a comprehensive MRSA colonization surveillance program. It is not intended to diagnose MRSA infection nor to guide or monitor treatment for MRSA infections. Performed at [facility], [address] [place], [place] [address]   Culture, blood (routine x 2)     Status: None (Preliminary result)   Collection Time: 10/19/20  5:49 PM   Specimen: BLOOD  Result Value Ref Range Status   Specimen Description BLOOD BLOOD LEFT HAND  Final   Special Requests   Final    BOTTLES DRAWN AEROBIC AND ANAEROBIC Blood Culture adequate volume   Culture   Final    NO GROWTH < 24 HOURS Performed at [facility], [address], [place], [place] [address]    Report Status PENDING  Incomplete  Culture, blood (routine x 2)     Status: None (Preliminary result)   Collection Time: 10/19/20  6:53 PM   Specimen: BLOOD  Result Value Ref Range Status   Specimen Description BLOOD BLOOD RIGHT HAND  Final   Special Requests   Final    BOTTLES DRAWN AEROBIC ONLY Blood Culture adequate volume   Culture   Final    NO GROWTH < 12 HOURS Performed at [facility], [address], [place],  [address]    Report Status PENDING  Incomplete      Radiology Studies: CT ABDOMEN PELVIS W CONTRAST  Result Date: 10/18/2020 CLINICAL DATA:  87 year old with acute abdominal pain. Elevated LFTs and lipase. Nausea and vomiting. EXAM: CT ABDOMEN AND PELVIS WITH CONTRAST TECHNIQUE: Multidetector CT imaging of the abdomen and pelvis was performed using the standard protocol following bolus administration of intravenous contrast. CONTRAST:  25m OMNIPAQUE IOHEXOL  300 MG/ML  SOLN COMPARISON:  None. FINDINGS: Lower chest: Small hiatal hernia. Motion artifact in the lung bases limits parenchymal evaluation. Coronary artery calcifications. 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 No pleural fluid. Hepatobiliary: Cholecystectomy. Moderate intrahepatic biliary ductal dilatation. Dilated common bile duct at 17 mm. There are least 2 intraluminal densities within the distal common bile duct, more proximal measuring 15 mm. This likely represents choledocholithiasis, but is  nonspecific. No focal hepatic lesion. Pancreas: Pancreas is atrophic. Mild proximal pancreatic ductal dilatation at 5 mm. No definite peripancreatic fat stranding. No discrete pancreatic mass, although evaluation is limited by motion. Spleen: Normal in size without focal abnormality. Adrenals/Urinary Tract: Normal adrenal glands. Bilateral extrarenal pelvis configuration of both kidneys. No perinephric edema. No focal renal abnormality. Probable punctate nonobstructing stone in the upper left kidney. There may be mild bladder wall thickening. Stomach/Bowel: Small hiatal hernia. Mild distal esophageal wall thickening. Stomach otherwise unremarkable. No small bowel obstruction or inflammation. Normal appendix. Majority of the colon is decompressed. No colonic inflammation. Questionable posterior rectal wall thickening measuring up to 2.1 cm. Vascular/Lymphatic: Moderate aortic and branch atherosclerosis. No aortic aneurysm. No enlarged lymph nodes in the abdomen or pelvis. Reproductive: Hysterectomy without adnexal mass. Other: No abdominal ascites.  No free air or focal fluid collection. Musculoskeletal: Right hip arthroplasty. Lobulated lucency adjacent to the acetabular cup, series 2, image 63 with moderate joint effusion. Advanced left hip osteoarthritis. Multilevel degenerative change in the spine. IMPRESSION: 1. Cholecystectomy with intra and extrahepatic biliary ductal dilatation. There are least 2 intraluminal  densities within the distal common bile duct, more proximal measuring 15 mm. This likely represents choledocholithiasis, but is nonspecific. The possibility of polyp or mass is not excluded. Recommend further evaluation with ERCP. MRCP is not recommended in this patient given inability to stay still for CT. 2. Mild proximal pancreatic ductal dilatation at 5 mm, no discrete pancreatic mass, although evaluation is limited by motion. 3. Questionable posterior rectal wall thickening measuring up to 2.1 cm. Recommend correlation with physical exam. 4. Right hip arthroplasty with lucency adjacent to the acetabular cup, most consistent with particle disease. Moderate joint effusion. Findings were also seen on 01/17/2016 pelvis CT. 5. Small hiatal hernia with mild distal esophageal wall thickening, can be seen with reflux or esophagitis. 6. Possible mild bladder wall thickening. 7. Possible punctate nonobstructing stone in the upper left kidney. Aortic Atherosclerosis (ICD10-I70.0). Electronically Signed   By: Keith Rake M.D.   On: 10/18/2020 22:02   DG Chest Port 1 View  Result Date: 10/19/2020 CLINICAL DATA:  Shortness of breath for 2 days EXAM: PORTABLE CHEST 1 VIEW COMPARISON:  05/19/2018 FINDINGS: Cardiac shadow is enlarged but stable. Postsurgical changes are again seen. Mild vascular congestion is noted without significant interstitial edema. No focal infiltrate is noted. IMPRESSION: Early CHF without significant edema. Electronically Signed   By: Inez Catalina M.D.   On: 10/19/2020 01:08   DG C-Arm 1-60 Min-No Report  Result Date: 10/20/2020 Fluoroscopy was utilized by the requesting physician.  No radiographic interpretation.     Scheduled Meds: . [START ON 10/21/2020] aspirin EC  81 mg Oral Daily  . Chlorhexidine Gluconate Cloth  6 each Topical Daily  . heparin  5,000 Units Subcutaneous Q8H  . pantoprazole (PROTONIX) IV  40 mg Intravenous Q24H  . timolol  1 drop Both Eyes [id number]   Continuous  Infusions: . sodium chloride Stopped (10/19/20 1944)  . piperacillin-tazobactam (ZOSYN)  IV 12.5 mL/hr at 10/20/20 1400     LOS: 2 days     Enzo Bi, MD [facility] If 7PM-7AM, please contact night-coverage 10/20/2020, 3:39 PM

## 2020-10-20 NOTE — Telephone Encounter (Signed)
She is in the hospital.  They will get appropriate referrals involved.

## 2020-10-20 NOTE — Telephone Encounter (Signed)
Patient's legal guardian [name] called wanting to know what can we do for treatment for patient's chronic anemia? She reports that the patient is currently in the ICU and is needing to have a procedure done. She reports that she has a stone in her bile duct. She reports that they can not proceed with the procedure because her Hgb is too low. She states that she had a blood transfusion in the ER and her Hgb was still at 6.8. She was wanting to know if it is time to get a specialist involved such as a hematologist?  Please advise.   It was noted back in 02/2020 that it was recommended that patient start iron supplements daily. She reports that the patient has not been on any supplements. Just FYI.

## 2020-10-20 NOTE — Progress Notes (Signed)
Pt transferred to [facility] with transport. Telemetry monitoring discontinued.

## 2020-10-20 NOTE — Telephone Encounter (Signed)
Copied from [place] [id number]. Topic: General - Other >> Oct 20, 2020  8:56 AM Tessa Lerner A wrote: Reason for CRM: Patient is currently hospitalized at [facility] since 10/18/20 and receiving blood transfusions.  Patient has been under the care of Dr. Billie Ruddy while at [facility] Patient's granddaughter  [facility]) has concerns related to patient's hemoglobin and potential anemia  Patient's granddaughter would like to discuss patient's past bloodwork and labs in depth with clinical staff Please contact to advise

## 2020-10-20 NOTE — Plan of Care (Signed)
Pt's BP has been stable on Levophed 1 mcg/min.  RR elevated, however, no distress and O2 sat 100% on room air.    In my opinion, benefits of receiving ERCP outweigh the risks.  I recommend proceeding with ERCP today.

## 2020-10-20 NOTE — Op Note (Signed)
[facility] Gastroenterology Patient Name: Gabriela [name] Procedure Date: 10/20/2020 11:34 AM MRN: [id number] Account #: [id number] Date of Birth: [birth year] Admit Type: Inpatient Age: 87 Room: [facility] ENDO ROOM 4 Gender: Female Note Status: Finalized Procedure:             ERCP Indications:           Common bile duct stone(s) Providers:             Lucilla Lame MD, MD Medicines:             Propofol per Anesthesia Complications:         No immediate complications. Procedure:             Pre-Anesthesia Assessment:                        - Prior to the procedure, a History and Physical was                         performed, and patient medications and allergies were                         reviewed. The patient's tolerance of previous                         anesthesia was also reviewed. The risks and benefits                         of the procedure and the sedation options and risks                         were discussed with the patient. All questions were                         answered, and informed consent was obtained. Prior                         Anticoagulants: The patient has taken no previous                         anticoagulant or antiplatelet agents. ASA Grade                         Assessment: II - A patient with mild systemic disease.                         After reviewing the risks and benefits, the patient                         was deemed in satisfactory condition to undergo the                         procedure.                        After obtaining informed consent, the scope was passed                         under direct vision. Throughout the procedure, the  patient's blood pressure, pulse, and oxygen                         saturations were monitored continuously. The [employer] D single use duodenoscope was                         introduced through the mouth, and used to  inject                         contrast into and used to inject contrast into the                         bile duct. The ERCP was accomplished without                         difficulty. The patient tolerated the procedure well. Findings:      A scout film of the abdomen was obtained. Surgical clips, consistent       with a previous cholecystectomy, were seen in the area of the right       upper quadrant of the abdomen. The esophagus was successfully intubated       under direct vision. The scope was advanced to a normal major papilla in       the descending duodenum without detailed examination of the pharynx,       larynx and associated structures, and upper GI tract. The upper GI tract       was grossly normal. The bile duct was deeply cannulated with the       short-nosed traction sphincterotome. Contrast was injected. I personally       interpreted the bile duct images. There was brisk flow of contrast       through the ducts. Image quality was excellent. Contrast extended to the       entire biliary tree. The main bile duct was diffusely dilated. The lower       third of the main bile duct contained filling defect(s) thought to be a       stone. A wire was passed into the biliary tree. A 6 mm biliary       sphincterotomy was made with a traction (standard) sphincterotome using       ERBE electrocautery. There was no post-sphincterotomy bleeding. One 10       Fr by 9 cm plastic stent with a single external flap and a single       internal flap was placed 7 cm into the common bile duct. Bile flowed       through the stent. The stent was in good position. Impression:            - A filling defect consistent with a stone was seen on                         the cholangiogram.                        - The entire main bile duct was dilated.                        -  The examination was suspicious for                         choledocholithiasis. Removal was not attempted; a                          stent was inserted.                        - A biliary sphincterotomy was performed.                        - One plastic stent was placed into the common bile                         duct. Recommendation:        - Return patient to hospital ward for ongoing care.                        - Clear liquid diet.                        - Repeat ERCP in 2 months to remove stent. Procedure Code(s):     --- Professional ---                        [phone], Endoscopic retrograde cholangiopancreatography                         (ERCP); with placement of endoscopic stent into                         biliary or pancreatic duct, including pre- and                         post-dilation and guide wire passage, when performed,                         including sphincterotomy, when performed, each stent                        [id number], Endoscopic catheterization of the biliary                         ductal system, radiological supervision and                         interpretation Diagnosis Code(s):     --- Professional ---                        K80.50, Calculus of bile duct without cholangitis or                         cholecystitis without obstruction                        K83.8, Other specified diseases of biliary tract                        R93.2, Abnormal findings on diagnostic imaging of  liver and biliary tract CPT copyright 2019 American Medical Association. All rights reserved. The codes documented in this report are preliminary and upon coder review may  be revised to meet current compliance requirements. Lucilla Lame MD, MD 10/20/2020 12:18:23 PM This report has been signed electronically. Number of Addenda: 0 Note Initiated On: 10/20/2020 11:34 AM Estimated Blood Loss:  Estimated blood loss: none.      [facility]

## 2020-10-20 NOTE — Progress Notes (Signed)
Transferred patient to endo on monitor with daughter at side. Bedside report given to Almyra Free, [place].

## 2020-10-20 NOTE — Progress Notes (Signed)
Report called to Celso Amy, RN on 2C.  Patient will be moved to room 221 once room is cleaned.

## 2020-10-20 NOTE — Transfer of Care (Signed)
Immediate Anesthesia Transfer of Care Note  [name]  Procedure(s) Performed: ENDOSCOPIC RETROGRADE CHOLANGIOPANCREATOGRAPHY (ERCP) (N/A )  Patient Location: Endoscopy Unit  Anesthesia Type:General  Level of Consciousness: awake, alert  and oriented  Airway & Oxygen Therapy: Patient Spontanous Breathing  Post-op Assessment: Report given to RN and Post -op Vital signs reviewed and stable  Post vital signs: Reviewed  Last Vitals:  Vitals Value Taken Time  BP 218/194 10/20/20 1214  Temp    Pulse 67 10/20/20 1221  Resp 34 10/20/20 1221  SpO2 100 % 10/20/20 1221  Vitals shown include unvalidated device data.  Last Pain:  Vitals:   10/20/20 1216  TempSrc:   PainSc: 0-No pain         Complications: No complications documented.

## 2020-10-20 NOTE — Anesthesia Preprocedure Evaluation (Signed)
Anesthesia Evaluation  Patient identified by MRN, date of birth, ID band Patient awake    Reviewed: Allergy & Precautions, NPO status , Patient's Chart, lab work & pertinent test results  History of Anesthesia Complications Negative for: history of anesthetic complications  Airway Mallampati: II       Dental  (+) Edentulous Upper, Missing, Poor Dentition, Chipped   Pulmonary neg sleep apnea, neg COPD, Not current smoker,           Cardiovascular hypertension, Pt. on medications and Pt. on home beta blockers + CAD and + CABG  (-) CHF (-) dysrhythmias (-) Valvular Problems/Murmurs     Neuro/Psych neg Seizures    GI/Hepatic Neg liver ROS, neg GERD  ,  Endo/Other  diabetes, Type 2, Oral Hypoglycemic Agents  Renal/GU Renal InsufficiencyRenal disease     Musculoskeletal   Abdominal   Peds  Hematology   Anesthesia Other Findings   Reproductive/Obstetrics                             Anesthesia Physical Anesthesia Plan  ASA: III and emergent  Anesthesia Plan: General   Post-op Pain Management:    Induction: Intravenous  PONV Risk Score and Plan: 3 and Propofol infusion and TIVA  Airway Management Planned: Nasal Cannula  Additional Equipment:   Intra-op Plan:   Post-operative Plan:   Informed Consent: I have reviewed the patients History and Physical, chart, labs and discussed the procedure including the risks, benefits and alternatives for the proposed anesthesia with the patient or authorized representative who has indicated his/her understanding and acceptance.       Plan Discussed with:   Anesthesia Plan Comments:         Anesthesia Quick Evaluation

## 2020-10-20 NOTE — Anesthesia Postprocedure Evaluation (Signed)
Anesthesia Post Note  [name]  Procedure(s) Performed: ENDOSCOPIC RETROGRADE CHOLANGIOPANCREATOGRAPHY (ERCP) (N/A )  Patient location during evaluation: Endoscopy Anesthesia Type: General Level of consciousness: awake and alert Pain management: pain level controlled Vital Signs Assessment: post-procedure vital signs reviewed and stable Respiratory status: spontaneous breathing and respiratory function stable Cardiovascular status: stable Anesthetic complications: no   No complications documented.   Last Vitals:  Vitals:   10/20/20 1216 10/20/20 1228  BP: (!) 98/38 99/64  Pulse: 68 65  Resp: (!) 37 (!) 27  Temp: 36.6 C   SpO2: 100% 100%    Last Pain:  Vitals:   10/20/20 1228  TempSrc:   PainSc: 0-No pain                 Tip Atienza K

## 2020-10-21 ENCOUNTER — Encounter: Payer: Self-pay | Admitting: Gastroenterology

## 2020-10-21 ENCOUNTER — Other Ambulatory Visit: Payer: Self-pay

## 2020-10-21 DIAGNOSIS — R7989 Other specified abnormal findings of blood chemistry: Secondary | ICD-10-CM

## 2020-10-21 DIAGNOSIS — K8050 Calculus of bile duct without cholangitis or cholecystitis without obstruction: Secondary | ICD-10-CM

## 2020-10-21 LAB — CBC
HCT: 25.1 % — ABNORMAL LOW (ref 36.0–46.0)
Hemoglobin: 8.4 g/dL — ABNORMAL LOW (ref 12.0–15.0)
MCH: 25.9 pg — ABNORMAL LOW (ref 26.0–34.0)
MCHC: 33.5 g/dL (ref 30.0–36.0)
MCV: 77.5 fL — ABNORMAL LOW (ref 80.0–100.0)
Platelets: 73 10*3/uL — ABNORMAL LOW (ref 150–400)
RBC: 3.24 MIL/uL — ABNORMAL LOW (ref 3.87–5.11)
RDW: 17.9 % — ABNORMAL HIGH (ref 11.5–15.5)
WBC: 15.8 10*3/uL — ABNORMAL HIGH (ref 4.0–10.5)
nRBC: 0 % (ref 0.0–0.2)

## 2020-10-21 LAB — COMPREHENSIVE METABOLIC PANEL
ALT: 167 U/L — ABNORMAL HIGH (ref 0–44)
AST: 145 U/L — ABNORMAL HIGH (ref 15–41)
Albumin: 2.7 g/dL — ABNORMAL LOW (ref 3.5–5.0)
Alkaline Phosphatase: 298 U/L — ABNORMAL HIGH (ref 38–126)
Anion gap: 7 (ref 5–15)
BUN: 47 mg/dL — ABNORMAL HIGH (ref 8–23)
CO2: 22 mmol/L (ref 22–32)
Calcium: 8.1 mg/dL — ABNORMAL LOW (ref 8.9–10.3)
Chloride: 111 mmol/L (ref 98–111)
Creatinine, Ser: 1.83 mg/dL — ABNORMAL HIGH (ref 0.44–1.00)
GFR, Estimated: 26 mL/min — ABNORMAL LOW (ref >60)
Glucose, Bld: 91 mg/dL (ref 70–99)
Potassium: 3.4 mmol/L — ABNORMAL LOW (ref 3.5–5.1)
Sodium: 140 mmol/L (ref 135–145)
Total Bilirubin: 4.4 mg/dL — ABNORMAL HIGH (ref 0.3–1.2)
Total Protein: 6.4 g/dL — ABNORMAL LOW (ref 6.5–8.1)

## 2020-10-21 LAB — GLUCOSE, CAPILLARY
Glucose-Capillary: 113 mg/dL — ABNORMAL HIGH (ref 70–99)
Glucose-Capillary: 98 mg/dL (ref 70–99)

## 2020-10-21 LAB — MAGNESIUM: Magnesium: 1.9 mg/dL (ref 1.7–2.4)

## 2020-10-21 MED ORDER — POTASSIUM CHLORIDE CRYS ER 20 MEQ PO TBCR
40.0000 meq | EXTENDED_RELEASE_TABLET | Freq: Once | ORAL | Status: AC
  Administered 2020-10-21: 40 meq via ORAL
  Filled 2020-10-21: qty 2

## 2020-10-21 MED ORDER — PIPERACILLIN-TAZOBACTAM IN DEX 2-0.25 GM/50ML IV SOLN
2.2500 g | Freq: Four times a day (QID) | INTRAVENOUS | Status: DC
  Administered 2020-10-21 – 2020-10-22 (×3): 2.25 g via INTRAVENOUS
  Filled 2020-10-21 (×6): qty 50

## 2020-10-21 MED ORDER — BISACODYL 10 MG RE SUPP: 10.0000 mg | Freq: Every day | RECTAL | Status: DC | PRN

## 2020-10-21 MED ORDER — INSULIN ASPART 100 UNIT/ML ~~LOC~~ SOLN: 0.0000 [IU] | Freq: Three times a day (TID) | SUBCUTANEOUS | Status: DC

## 2020-10-21 MED ORDER — METOPROLOL TARTRATE 25 MG PO TABS
12.5000 mg | ORAL_TABLET | Freq: Every day | ORAL | Status: DC
  Administered 2020-10-21 – 2020-10-22 (×2): 12.5 mg via ORAL
  Filled 2020-10-21 (×2): qty 1

## 2020-10-21 MED ORDER — INSULIN ASPART 100 UNIT/ML ~~LOC~~ SOLN: 0.0000 [IU] | Freq: Every day | SUBCUTANEOUS | Status: DC

## 2020-10-21 MED ORDER — PIPERACILLIN-TAZOBACTAM 3.375 G IVPB: 3.3750 g | Freq: Two times a day (BID) | INTRAVENOUS | Status: DC

## 2020-10-21 NOTE — Progress Notes (Signed)
PHARMACY NOTE:  ANTIMICROBIAL RENAL DOSAGE ADJUSTMENT  Current antimicrobial regimen includes a mismatch between antimicrobial dosage and estimated renal function.  As per policy approved by the Pharmacy & Therapeutics and Medical Executive Committees, the antimicrobial dosage will be adjusted accordingly.  Current antimicrobial dosage:  Zosyn 3.375 g IV q12h extended infusion  Indication: intra-abdominal infection  Renal Function:  Estimated Creatinine Clearance: 16.9 mL/min (A) (by C-G formula based on SCr of 1.83 mg/dL (H)).    Antimicrobial dosage has been changed to:  Zosyn 2.25 g IV q6h (30 min infusion)  Additional comments: per [facility] recommendations for CrCl < 20 ml/min, making urine   Thank you for allowing pharmacy to be a part of this patient's care.  Dorena Bodo, PharmD 10/21/2020 3:50 PM

## 2020-10-21 NOTE — Progress Notes (Signed)
Progress Note    Gabriela [name]  HQI:[id number] DOB: 05/01/1926  DOA: 10/18/2020 PCP: Jerrol Banana., MD    Brief Narrative:     Medical records reviewed and are as summarized below:  Gabriela [name] is an 87 y.o. 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  Found to have choledocholithiasis.  S/p ERCP and stent.    Assessment/Plan:   Active Problems:   Elevated LFTs   Choledocholithiasis   Common bile duct dilatation   Abnormal findings on imaging of biliary tract   Septic shock 2/2 cholangitis, resolved --on levophed for short time   Cholangitis/Choledocholithiasis  --CT a/p showed "Cholecystectomy with intra and extrahepatic biliary ductal dilatation. There are least 2 intraluminal densities within the distal common bile duct, more proximal measuring 15 mm. This likely represents choledocholithiasis". --s/p ERCP 3/8, received a stent to common bile duct --cont IV zosyn --advance to full liquid diet --ERCP in 2 months for stent removal.   Gallstone pancreatitis --lipase went from 97 to 647 next day, back down to 117 with MIVF. -slowly advance diet   AKI on CKDIII --due to infection and hypotension --Cr improved with MIVF and low-dose pressor -- encourage oral hydration   Acute on chronic anemia --Hgb dropped from 8.2 to 6.8 next morning --transfused 1u pRBC -outpatient referral to hematology per family request    CAD s/p CABG --cont home ASA    Chronic thrombocytopenia -suspect worsened due to sepsis -monitor -outpatient follow up    DMII insulin dependent, well controlled --Hold insulin for now   HLD -check lipids    HTN --resume  BB   Glaucoma -no active issues  --cont home eye drops   Arthritis/ Failing Right Hip prothesis  -with hx of chronic right thigh wound due to this -followed by out patient orthopedics -supportive care    Family Communication/Anticipated D/C date and plan/Code Status   DVT prophylaxis: heparin Code Status: Full Code.  Disposition Plan: Status is: Inpatient  Remains inpatient appropriate because:Inpatient level of care appropriate due to severity of illness   Dispo: The patient is from: Home              Anticipated d/c is to: Home              Patient currently is not medically stable to d/c.   Difficult to place patient No         Medical Consultants:    GI     Subjective:   Has not been out of bed Occasionally get abdominal discomfort-- not triggered by food  Objective:    Vitals:   10/21/20 0443 10/21/20 0554 10/21/20 0836 10/21/20 1239  BP: 135/67  (!) 142/60 (!) 141/64  Pulse: 62  (!) 56 60  Resp: 16   16  Temp: 97.6 F (36.4 C)  97.6 F (36.4 C) 97.8 F (36.6 C)  TempSrc: Oral  Oral Oral  SpO2: 100%  100% 100%  Weight:  58.7 kg    Height:        Intake/Output Summary (Last 24 hours) at 10/21/2020 1249 Last data filed at 10/21/2020 0557 Gross per 24 hour  Intake 212.16 ml  Output 475 ml  Net -262.84 ml   Danley Danker  Weights   10/18/20 1929 10/20/20 1119 10/21/20 0554  Weight: 50 kg 60.8 kg 58.7 kg    Exam:  General: Appearance:    Well developed, well nourished female in no acute distress   +BS, minimal tenderness  Lungs:     respirations unlabored  Heart:    Normal heart rate. Normal rhythm.    MS:   All extremities are intact.   Neurologic:   Awake, alert, oriented x 3 pleasant and cooperative    Data Reviewed:   I have personally reviewed following labs and imaging studies:  Labs: Labs show the following:   Basic Metabolic Panel: Recent Labs  Lab 10/18/20 1933 10/19/20 0439 10/19/20 1750 10/20/20 0443 10/21/20 0525  NA  139 139 136 140 140  K 4.1 3.3* 4.1 3.5 3.4*  CL 105 107 109 112* 111  CO2 21* 20* 15* 18* 22  GLUCOSE 168* 158* 167* 102* 91  BUN 56* 58* 59* 55* 47*  CREATININE 1.72* 2.07* 2.20* 1.90* 1.83*  CALCIUM 8.9 8.2* 7.3* 7.6* 8.1*  MG  --   --   --  1.8 1.9   GFR Estimated Creatinine Clearance: 16.9 mL/min (A) (by C-G formula based on SCr of 1.83 mg/dL (H)). Liver Function Tests: Recent Labs  Lab 10/18/20 1933 10/19/20 0439 10/19/20 1750 10/20/20 0443 10/21/20 0525  AST 456* 751* 414* 273* 145*  ALT 163* 357* 317* 241* 167*  ALKPHOS 527* 496* 408* 374* 298*  BILITOT 1.4* 2.5* 3.5* 3.5* 4.4*  PROT 8.8* 7.4 7.1 6.5 6.4*  ALBUMIN 4.0 3.2* 3.1* 2.7* 2.7*   Recent Labs  Lab 10/18/20 1933 10/19/20 0428 10/20/20 0443  LIPASE 97* 647* 117*   No results for input(s): AMMONIA in the last 168 hours. Coagulation profile Recent Labs  Lab 10/19/20 0439  INR 1.3*    CBC: Recent Labs  Lab 10/18/20 1933 10/19/20 0439 10/19/20 1750 10/20/20 0443 10/21/20 0525  WBC 7.5 25.0* 28.5* 23.3* 15.8*  HGB 8.2* 6.8* 8.0* 8.5* 8.4*  HCT 26.1* 21.8* 25.4* 24.9* 25.1*  MCV 81.3 80.7 83.6 78.5* 77.5*  PLT 105* 91* 79* 80* 73*   Cardiac Enzymes: No results for input(s): CKTOTAL, CKMB, CKMBINDEX, TROPONINI in the last 168 hours. BNP (last 3 results) No results for input(s): PROBNP in the last 8760 hours. CBG: Recent Labs  Lab 10/19/20 2336 10/20/20 0343 10/20/20 0733 10/20/20 1606 10/20/20 1906  GLUCAP 130* 86 93 123* 107*   D-Dimer: No results for input(s): DDIMER in the last 72 hours. Hgb A1c: Recent Labs    10/19/20 0439  HGBA1C 6.6*   Lipid Profile: Recent Labs    10/20/20 1654  CHOL 70  HDL 15*  LDLCALC 34  TRIG 106  CHOLHDL 4.7   Thyroid function studies: Recent Labs    10/19/20 0439  TSH 1.006   Anemia work up: Recent Labs    10/20/20 0428 10/20/20 0443  VITAMINB12 666  --   FOLATE  --  12.1  TIBC  --  185*  IRON  --  36   Sepsis Labs: Recent  Labs  Lab 10/19/20 0217 10/19/20 0439 10/19/20 1750 10/20/20 0443 10/20/20 1457 10/21/20 0525  PROCALCITON 52.19  --   --   --   --   --   WBC  --  25.0* 28.5* 23.3*  --  15.8*  LATICACIDVEN 2.6* 2.4* 2.4*  --  1.4  --     Microbiology Recent Results (from the past 240 hour(s))  SARS CORONAVIRUS 2 (TAT  6-24 HRS) Nasopharyngeal Nasopharyngeal Swab     Status: None   Collection Time: 10/18/20  7:26 PM   Specimen: Nasopharyngeal Swab  Result Value Ref Range Status   SARS Coronavirus 2 NEGATIVE NEGATIVE Final    Comment: (NOTE) SARS-CoV-2 target nucleic acids are NOT DETECTED.  The SARS-CoV-2 RNA is generally detectable in upper and lower respiratory specimens during the acute phase of infection. Negative results do not preclude SARS-CoV-2 infection, do not rule out co-infections with other pathogens, and should not be used as the sole basis for treatment or other patient management decisions. Negative results must be combined with clinical observations, patient history, and epidemiological information. The expected result is Negative.  Fact Sheet for Patients: SugarRoll.be  Fact Sheet for Healthcare Providers: https://www.woods-mathews.com/  This test is not yet approved or cleared by the [place] FDA and  has been authorized for detection and/or diagnosis of SARS-CoV-2 by FDA under an Emergency Use Authorization (EUA). This EUA will remain  in effect (meaning this test can be used) for the duration of the COVID-19 declaration under Se ction 564(b)(1) of the Act, 21 U.S.C. section 360bbb-3(b)(1), unless the authorization is terminated or revoked sooner.  Performed at [facility], [place] [address] [place], [place] [address]   MRSA PCR Screening     Status: None   Collection Time: 10/19/20  4:29 PM   Specimen: Nasal Mucosa; Nasopharyngeal  Result Value Ref Range Status   MRSA by PCR NEGATIVE NEGATIVE Final    Comment:         The GeneXpert MRSA Assay (FDA approved for NASAL specimens only), is one component of a comprehensive MRSA colonization surveillance program. It is not intended to diagnose MRSA infection nor to guide or monitor treatment for MRSA infections. Performed at [facility], [address] [place], [place] [address]   Culture, blood (routine x 2)     Status: None (Preliminary result)   Collection Time: 10/19/20  5:49 PM   Specimen: BLOOD  Result Value Ref Range Status   Specimen Description BLOOD BLOOD LEFT HAND  Final   Special Requests   Final    BOTTLES DRAWN AEROBIC AND ANAEROBIC Blood Culture adequate volume   Culture   Final    NO GROWTH 2 DAYS Performed at [facility], [address], [place], [place] [address]    Report Status PENDING  Incomplete  Culture, blood (routine x 2)     Status: None (Preliminary result)   Collection Time: 10/19/20  6:53 PM   Specimen: BLOOD  Result Value Ref Range Status   Specimen Description BLOOD BLOOD RIGHT HAND  Final   Special Requests   Final    BOTTLES DRAWN AEROBIC ONLY Blood Culture adequate volume   Culture   Final    NO GROWTH 2 DAYS Performed at [facility], [address] [place],  [address]    Report Status PENDING  Incomplete    Procedures and diagnostic studies:  DG C-Arm 1-60 Min-No Report  Result Date: 10/20/2020 Fluoroscopy was utilized by the requesting physician.  No radiographic interpretation.    Medications:   . aspirin EC  81 mg Oral Daily  . heparin  5,000 Units Subcutaneous Q8H  . timolol  1 drop Both Eyes BH-[id number]   Continuous Infusions: . sodium chloride Stopped (10/19/20 1944)  . piperacillin-tazobactam (ZOSYN)  IV       LOS: 3 days   Geradine Girt  [facility]   How  to contact the [facility] Attending or Consulting provider [place] or covering provider during after hours [place], for this patient?  1. Check the care team in [facility] and look for a)  attending/consulting TR[facility] provider listed and b) the [facility] team listed 2. Log into [email or web address] and use [facility]'s universal password to access. If you do not have the password, please contact the hospital operator. 3. Locate the [facility] provider you are looking for under [facility] and page to a number that you can be directly reached. 4. If you still have difficulty reaching the provider, please page the [facility] (Director on Call) for the Hospitalists listed on amion for assistance.  10/21/2020, 12:49 PM

## 2020-10-21 NOTE — Care Management Important Message (Signed)
Important Message  Patient Details  Name: Gabriela [name] MRN: [id number] Date of Birth: 12-02-1925   Medicare Important Message Given:  Yes     Dannette Barbara 10/21/2020, 11:07 AM

## 2020-10-21 NOTE — Evaluation (Signed)
Physical Therapy Evaluation Patient Details Name: Gabriela [name] MRN: [id number] DOB: Feb 06, 1926 Today's Date: 10/21/2020   History of Present Illness  presented to ER secondary to nausea, vomiting and abdominal pain; admitted for management of cholangitis, choledocholithiasis.  s/p ERCP with stent to CBD (3/8)  Clinical Impression  Upon evaluation, patient alert and oriented; follows commands and demonstrates good effort with mobility tasks.  Refers to R LE as "bad leg" (due to previous history of hip repair, failing prosthesis per chart), but denies current pain.  Bilat UE/LE strength and ROM grossly symmetrical and WFL for basic transfers and gait; no focal weakness appreciated.  Currently completes bed mobility with mod indep; sit/stand, basic transfers and gait (100') with RW, cga/min assist.  Demonstrates forward flexed posture, slow/guarded cadence, but no overt buckling or LOB; good awareness of safety needs and use of compensatory strategies as needed. Would benefit from skilled PT to address above deficits and promote optimal return to PLOF.; Recommend transition to HHPT upon discharge from acute hospitalization.     Follow Up Recommendations Home health PT    Equipment Recommendations       Recommendations for Other Services       Precautions / Restrictions Precautions Precautions: Fall Restrictions Weight Bearing Restrictions: No      Mobility  Bed Mobility Overal bed mobility: Modified Independent                  Transfers Overall transfer level: Needs assistance Equipment used: Rolling walker (2 wheeled) Transfers: Sit to/from Stand Sit to Stand: Min guard         General transfer comment: good hand placement, safety awareness  Ambulation/Gait Ambulation/Gait assistance: Min guard Gait Distance (Feet): 100 Feet Assistive device: Rolling walker (2 wheeled)       General Gait Details: forward flexed posture, slow/guarded cadence, but no overt  buckling or LOB; good awareness of safety needs and use of compensatory strategies as needed.  Stairs            Wheelchair Mobility    Modified Rankin (Stroke Patients Only)       Balance Overall balance assessment: Needs assistance Sitting-balance support: No upper extremity supported;Feet supported Sitting balance-Leahy Scale: Good     Standing balance support: Bilateral upper extremity supported Standing balance-Leahy Scale: Fair                               Pertinent Vitals/Pain Pain Assessment: No/denies pain    Home Living Family/patient expects to be discharged to:: Private residence Living Arrangements: Alone Available Help at Discharge: Family;Available PRN/intermittently Type of Home: House Home Access: Level entry     Home Layout: One level Home Equipment: Walker - 4 wheels Additional Comments: Granddaughter planning to stay with patient for several days upon discharge to ensure smooth transition home    Prior Function Level of Independence: Independent with assistive device(s)         Comments: Mod indep with 4WRW for ADLs, household mobilization; no home O2; denies fall history.  Does have aide 2-3 hours/day, 7 days/week to assist with household chores/upkeep.  Family lives local (and next door); provides frequent check in.     Hand Dominance        Extremity/Trunk Assessment   Upper Extremity Assessment Upper Extremity Assessment: Overall WFL for tasks assessed    Lower Extremity Assessment Lower Extremity Assessment: Overall WFL for tasks assessed (grossly 4/5 throughout)  Communication   Communication: No difficulties  Cognition Arousal/Alertness: Awake/alert Behavior During Therapy: WFL for tasks assessed/performed Overall Cognitive Status: Within Functional Limits for tasks assessed                                        General Comments      Exercises Other Exercises Other Exercises:  Toilet transfer, ambulatory with RW, cga/close sup; sit/stand from [facility] (placed over standard toilet), cga/close sup.  Standing balance at sink for hand hygiene, close sup; using counter top for external stabilization.  Limited functional reach evident, but patient with good awareness of limits of stability and overall safety needs.   Assessment/Plan    PT Assessment Patient needs continued PT services  PT Problem List Decreased activity tolerance;Decreased balance;Decreased mobility;Decreased knowledge of use of DME;Decreased safety awareness;Decreased knowledge of precautions       PT Treatment Interventions DME instruction;Gait training;Functional mobility training;Balance training;Therapeutic exercise;Therapeutic activities;Patient/family education    PT Goals (Current goals can be found in the Care Plan section)  Acute Rehab PT Goals Patient Stated Goal: to go home PT Goal Formulation: With patient/family Time For Goal Achievement: 11/04/20 Potential to Achieve Goals: Good    Frequency Min 2X/week   Barriers to discharge        Co-evaluation               AM-PAC PT "6 Clicks" Mobility  Outcome Measure Help needed turning from your back to your side while in a flat bed without using bedrails?: None   Help needed moving to and from a bed to a chair (including a wheelchair)?: None Help needed standing up from a chair using your arms (e.g., wheelchair or bedside chair)?: A Little Help needed to walk in hospital room?: A Little Help needed climbing 3-5 steps with a railing? : A Little 6 Click Score: 17    End of Session Equipment Utilized During Treatment: Gait belt Activity Tolerance: Patient tolerated treatment well Patient left: in chair;with call bell/phone within reach;with chair alarm set;with family/visitor present Nurse Communication: Mobility status PT Visit Diagnosis: Muscle weakness (generalized) (M62.81);Difficulty in walking, not elsewhere classified  (R26.2)    Time: 7741-2878 PT Time Calculation (min) (ACUTE ONLY): 36 min   Charges:   PT Evaluation $PT Eval Moderate Complexity: 1 Mod PT Treatments $Therapeutic Activity: 8-22 mins        Aquarius Latouche H. Owens Shark, PT, DPT, NCS 10/21/20, 2:49 PM [phone]

## 2020-10-21 NOTE — Consult Note (Signed)
Pharmacy Antibiotic Note  Gabriela [name] is a 87 y.o. female with medical history including CAD s/p CABG, mitral valve repair, ischemic cardiomyopathy, diabetes, HTN, CKD admitted on 10/18/2020 with choledocholithiasis, probable ascending cholangitis, and acute gallstone pancreatitis. Patient is status post ERCP with stenting of CBD on 3/8. Pharmacy has been consulted for Zosyn dosing.  Today, 10/21/20  --Day # 3 Zosyn --Afebrile, leukocytosis improving --Blood cultures remain negative  Plan:  Adjust Zosyn to 3.375 g IV q12h (extended infusion) --Renally adjusted based on CrCl < 20 mL/min  Height: 5\' 4"  (162.6 cm) Weight: 58.7 kg (129 lb 6.6 oz) IBW/kg (Calculated) : 54.7  Temp (24hrs), Avg:97.9 F (36.6 C), Min:97.6 F (36.4 C), Max:98.8 F (37.1 C)  Recent Labs  Lab 10/18/20 1933 10/19/20 0217 10/19/20 0439 10/19/20 1750 10/20/20 0443 10/20/20 1457 10/21/20 0525  WBC 7.5  --  25.0* 28.5* 23.3*  --  15.8*  CREATININE 1.72*  --  2.07* 2.20* 1.90*  --  1.83*  LATICACIDVEN  --  2.6* 2.4* 2.4*  --  1.4  --     Estimated Creatinine Clearance: 16.9 mL/min (A) (by C-G formula based on SCr of 1.83 mg/dL (H)).    Allergies  Allergen Reactions  . Sulfa Antibiotics Rash    Antimicrobials this admission: Zosyn 3/7 >>  Microbiology results: 3/7 BCx: NGTD 3/7 MRSA PCR: (-)  Thank you for allowing pharmacy to be a part of this patient's care.  Benita Gutter 10/21/2020 3:10 PM

## 2020-10-21 NOTE — Telephone Encounter (Signed)
Spoke to patient's legal guardian about this on 10/20/2020.

## 2020-10-22 DIAGNOSIS — K838 Other specified diseases of biliary tract: Secondary | ICD-10-CM

## 2020-10-22 LAB — CBC
HCT: 24.6 % — ABNORMAL LOW (ref 36.0–46.0)
Hemoglobin: 8.1 g/dL — ABNORMAL LOW (ref 12.0–15.0)
MCH: 26 pg (ref 26.0–34.0)
MCHC: 32.9 g/dL (ref 30.0–36.0)
MCV: 78.8 fL — ABNORMAL LOW (ref 80.0–100.0)
Platelets: 72 10*3/uL — ABNORMAL LOW (ref 150–400)
RBC: 3.12 MIL/uL — ABNORMAL LOW (ref 3.87–5.11)
RDW: 17.5 % — ABNORMAL HIGH (ref 11.5–15.5)
WBC: 7.3 10*3/uL (ref 4.0–10.5)
nRBC: 0.3 % — ABNORMAL HIGH (ref 0.0–0.2)

## 2020-10-22 LAB — COMPREHENSIVE METABOLIC PANEL
ALT: 123 U/L — ABNORMAL HIGH (ref 0–44)
AST: 88 U/L — ABNORMAL HIGH (ref 15–41)
Albumin: 2.6 g/dL — ABNORMAL LOW (ref 3.5–5.0)
Alkaline Phosphatase: 296 U/L — ABNORMAL HIGH (ref 38–126)
Anion gap: 9 (ref 5–15)
BUN: 36 mg/dL — ABNORMAL HIGH (ref 8–23)
CO2: 20 mmol/L — ABNORMAL LOW (ref 22–32)
Calcium: 8.4 mg/dL — ABNORMAL LOW (ref 8.9–10.3)
Chloride: 113 mmol/L — ABNORMAL HIGH (ref 98–111)
Creatinine, Ser: 1.51 mg/dL — ABNORMAL HIGH (ref 0.44–1.00)
GFR, Estimated: 32 mL/min — ABNORMAL LOW (ref >60)
Glucose, Bld: 91 mg/dL (ref 70–99)
Potassium: 4.2 mmol/L (ref 3.5–5.1)
Sodium: 142 mmol/L (ref 135–145)
Total Bilirubin: 4.4 mg/dL — ABNORMAL HIGH (ref 0.3–1.2)
Total Protein: 6.7 g/dL (ref 6.5–8.1)

## 2020-10-22 LAB — MAGNESIUM: Magnesium: 1.9 mg/dL (ref 1.7–2.4)

## 2020-10-22 LAB — GLUCOSE, CAPILLARY
Glucose-Capillary: 89 mg/dL (ref 70–99)
Glucose-Capillary: 106 mg/dL — ABNORMAL HIGH (ref 70–99)

## 2020-10-22 MED ORDER — PIPERACILLIN-TAZOBACTAM 3.375 G IVPB
3.3750 g | Freq: Three times a day (TID) | INTRAVENOUS | Status: DC
  Administered 2020-10-22: 3.375 g via INTRAVENOUS
  Filled 2020-10-22: qty 50

## 2020-10-22 MED ORDER — CIPROFLOXACIN HCL 500 MG PO TABS: 500.0000 mg | ORAL_TABLET | Freq: Every day | ORAL | Status: DC

## 2020-10-22 MED ORDER — METRONIDAZOLE 500 MG PO TABS: 500.0000 mg | ORAL_TABLET | Freq: Three times a day (TID) | ORAL | 0 refills | Status: DC

## 2020-10-22 MED ORDER — FUROSEMIDE 20 MG PO TABS: 20.0000 mg | ORAL_TABLET | Freq: Every day | ORAL | Status: DC

## 2020-10-22 MED ORDER — METRONIDAZOLE 500 MG PO TABS
500.0000 mg | ORAL_TABLET | Freq: Four times a day (QID) | ORAL | Status: DC
  Filled 2020-10-22 (×3): qty 1

## 2020-10-22 MED ORDER — CIPROFLOXACIN HCL 500 MG PO TABS: 500.0000 mg | ORAL_TABLET | Freq: Every day | ORAL | 0 refills | Status: DC

## 2020-10-22 NOTE — Progress Notes (Signed)
Gabriela Darby, MD [address]  [place], [place] [address]  Main: [phone]  Fax: [phone] Pager: [phone]   Subjective: Patient is doing well, moved out of ICU.  She is tolerating diet well.  Has been afebrile and hemodynamically stable.  She denies any abdominal pain.  Her granddaughter is bedside   Objective: Vital signs in last 24 hours: Vitals:   10/22/20 0500 10/22/20 0521 10/22/20 0716 10/22/20 1133  BP:  (!) 134/48 (!) 156/67 (!) 158/67  Pulse:  (!) 58 (!) 57 (!) 55  Resp:  16 20 16   Temp:  (!) 97.5 F (36.4 C) 98.2 F (36.8 C) 98.1 F (36.7 C)  TempSrc:  Oral Oral   SpO2:  100% 100% 100%  Weight: 59.1 kg     Height:       Weight change: -1.682 kg  Intake/Output Summary (Last 24 hours) at 10/22/2020 1816 Last data filed at 10/22/2020 2500 Gross per 24 hour  Intake 230.83 ml  Output 600 ml  Net -369.17 ml     Exam: Heart:: Regular rate and rhythm, S1S2 present or without murmur or extra heart sounds Lungs: normal and clear to auscultation Abdomen: soft, nontender, normal bowel sounds   Lab Results: CBC Latest Ref Rng & Units 10/22/2020 10/21/2020 10/20/2020  WBC 4.0 - 10.5 K/uL 7.3 15.8(H) 23.3(H)  Hemoglobin 12.0 - 15.0 g/dL 8.1(L) 8.4(L) 8.5(L)  Hematocrit 36.0 - 46.0 % 24.6(L) 25.1(L) 24.9(L)  Platelets 150 - 400 K/uL 72(L) 73(L) 80(L)   CMP Latest Ref Rng & Units 10/22/2020 10/21/2020 10/20/2020  Glucose 70 - 99 mg/dL 91 91 102(H)  BUN 8 - 23 mg/dL 36(H) 47(H) 55(H)  Creatinine 0.44 - 1.00 mg/dL 1.51(H) 1.83(H) 1.90(H)  Sodium 135 - 145 mmol/L 142 140 140  Potassium 3.5 - 5.1 mmol/L 4.2 3.4(L) 3.5  Chloride 98 - 111 mmol/L 113(H) 111 112(H)  CO2 22 - 32 mmol/L 20(L) 22 18(L)  Calcium 8.9 - 10.3 mg/dL 8.4(L) 8.1(L) 7.6(L)  Total Protein 6.5 - 8.1 g/dL 6.7 6.4(L) 6.5  Total Bilirubin 0.3 - 1.2 mg/dL 4.4(H) 4.4(H) 3.5(H)  Alkaline Phos 38 - 126 U/L 296(H) 298(H) 374(H)  AST 15 - 41 U/L 88(H) 145(H) 273(H)  ALT 0 - 44 U/L  123(H) 167(H) 241(H)     Micro Results: Recent Results (from the past 240 hour(s))  SARS CORONAVIRUS 2 (TAT 6-24 HRS) Nasopharyngeal Nasopharyngeal Swab     Status: None   Collection Time: 10/18/20  7:26 PM   Specimen: Nasopharyngeal Swab  Result Value Ref Range Status   SARS Coronavirus 2 NEGATIVE NEGATIVE Final    Comment: (NOTE) SARS-CoV-2 target nucleic acids are NOT DETECTED.  The SARS-CoV-2 RNA is generally detectable in upper and lower respiratory specimens during the acute phase of infection. Negative results do not preclude SARS-CoV-2 infection, do not rule out co-infections with other pathogens, and should not be used as the sole basis for treatment or other patient management decisions. Negative results must be combined with clinical observations, patient history, and epidemiological information. The expected result is Negative.  Fact Sheet for Patients: SugarRoll.be  Fact Sheet for Healthcare Providers: https://www.woods-mathews.com/  This test is not yet approved or cleared by the [place] FDA and  has been authorized for detection and/or diagnosis of SARS-CoV-2 by FDA under an Emergency Use Authorization (EUA). This EUA will remain  in effect (meaning this test can be used) for the duration of the COVID-19 declaration under Se ction 564(b)(1)  of the Act, 21 U.S.C. section 360bbb-3(b)(1), unless the authorization is terminated or revoked sooner.  Performed at [facility], [place] [address] [place], [place] [address]   MRSA PCR Screening     Status: None   Collection Time: 10/19/20  4:29 PM   Specimen: Nasal Mucosa; Nasopharyngeal  Result Value Ref Range Status   MRSA by PCR NEGATIVE NEGATIVE Final    Comment:        The GeneXpert MRSA Assay (FDA approved for NASAL specimens only), is one component of a comprehensive MRSA colonization surveillance program. It is not intended to diagnose MRSA infection nor  to guide or monitor treatment for MRSA infections. Performed at [facility], [address] [place], [place] [address]   Culture, blood (routine x 2)     Status: None (Preliminary result)   Collection Time: 10/19/20  5:49 PM   Specimen: BLOOD  Result Value Ref Range Status   Specimen Description BLOOD BLOOD LEFT HAND  Final   Special Requests   Final    BOTTLES DRAWN AEROBIC AND ANAEROBIC Blood Culture adequate volume   Culture   Final    NO GROWTH 3 DAYS Performed at [facility], [address] [place], [place] [address]    Report Status PENDING  Incomplete  Culture, blood (routine x 2)     Status: None (Preliminary result)   Collection Time: 10/19/20  6:53 PM   Specimen: BLOOD  Result Value Ref Range Status   Specimen Description BLOOD BLOOD RIGHT HAND  Final   Special Requests   Final    BOTTLES DRAWN AEROBIC ONLY Blood Culture adequate volume   Culture   Final    NO GROWTH 3 DAYS Performed at [facility], [address] [place], [place] [address]    Report Status PENDING  Incomplete   Studies/Results: No results found. Medications:  I have reviewed the patient's current medications. Prior to Admission:  No medications prior to admission.   Scheduled: . aspirin EC  81 mg Oral Daily  . [START ON 10/23/2020] ciprofloxacin  500 mg Oral Q breakfast  . heparin  5,000 Units Subcutaneous Q8H  . insulin aspart  0-5 Units Subcutaneous QHS  . insulin aspart  0-9 Units Subcutaneous TID WC  . metoprolol tartrate  12.5 mg Oral Daily  . metroNIDAZOLE  500 mg Oral Q6H  . timolol  1 drop Both Eyes BH-q7a   Continuous: . sodium chloride Stopped (10/19/20 1944)   HKV:[id number], bisacodyl, ondansetron **OR** ondansetron (ZOFRAN) IV, oxyCODONE Anti-infectives (From admission, onward)   Start     Dose/Rate Route Frequency Ordered Stop   10/23/20 0800  ciprofloxacin (CIPRO) tablet 500 mg        500 mg Oral Daily with breakfast 10/22/20 1411      10/23/20 0000  ciprofloxacin (CIPRO) 500 MG tablet        500 mg Oral Daily with breakfast 10/22/20 1416     10/22/20 1800  metroNIDAZOLE (FLAGYL) tablet 500 mg        500 mg Oral Every 6 hours 10/22/20 1411     10/22/20 1200  piperacillin-tazobactam (ZOSYN) IVPB 3.375 g  Status:  Discontinued        3.375 g 12.5 mL/hr over 240 Minutes Intravenous Every 8 hours 10/22/20 0830 10/22/20 1411   10/22/20 0000  metroNIDAZOLE (FLAGYL) 500 MG tablet        500 mg Oral 3 times daily 10/22/20 1416     10/21/20  2000  piperacillin-tazobactam (ZOSYN) IVPB 3.375 g  Status:  Discontinued        3.375 g 12.5 mL/hr over 240 Minutes Intravenous Every 12 hours 10/21/20 0731 10/21/20 1549   10/21/20 1800  piperacillin-tazobactam (ZOSYN) IVPB 2.25 g  Status:  Discontinued        2.25 g 100 mL/hr over 30 Minutes Intravenous Every 6 hours 10/21/20 1549 10/22/20 0830   10/20/20 1400  piperacillin-tazobactam (ZOSYN) IVPB 3.375 g  Status:  Discontinued        3.375 g 12.5 mL/hr over 240 Minutes Intravenous Every 8 hours 10/20/20 0755 10/21/20 0731   10/19/20 1030  piperacillin-tazobactam (ZOSYN) IVPB 2.25 g  Status:  Discontinued        2.25 g 100 mL/hr over 30 Minutes Intravenous Every 6 hours 10/19/20 0942 10/20/20 0755     Scheduled Meds: . aspirin EC  81 mg Oral Daily  . [START ON 10/23/2020] ciprofloxacin  500 mg Oral Q breakfast  . heparin  5,000 Units Subcutaneous Q8H  . insulin aspart  0-5 Units Subcutaneous QHS  . insulin aspart  0-9 Units Subcutaneous TID WC  . metoprolol tartrate  12.5 mg Oral Daily  . metroNIDAZOLE  500 mg Oral Q6H  . timolol  1 drop Both Eyes BH-q7a   Continuous Infusions: . sodium chloride Stopped (10/19/20 1944)   PRN Meds:.albuterol, bisacodyl, ondansetron **OR** ondansetron (ZOFRAN) IV, oxyCODONE   Assessment: Active Problems:   Elevated LFTs   Choledocholithiasis   Common bile duct dilatation   Abnormal findings on imaging of biliary tract  ERCP on 10/20/2020 - A  filling defect consistent with a stone was seen on the cholangiogram. - The entire main bile duct was dilated. - The examination was suspicious for choledocholithiasis. Removal was not attempted; a stent was inserted. - A biliary sphincterotomy was performed. - One plastic stent was placed into the common bile duct.  Plan: S/p cholecystectomy, presents with choledocholithiasis with acute gallstone pancreatitis and ascending cholangitis s/p ERCP with biliary sphincterotomy and plastic stent in the CBD duct in order to facilitate biliary drainage Patient has been responding well post procedure as well as with antibiotics, septic shock has currently resolved Blood cultures no growth to date Leukocytosis has resolved Patient can go home today on 1 week course of Cipro and Flagyl  We will schedule outpatient ERCP in 2 months Recommend to recheck LFTs by PCP in 1 week  Severe microcytic anemia Recommend iron panel, B12 and folate levels  Patient can be discharged home today     LOS: 4 days   Gabriela [name] 10/22/2020, 6:16 PM

## 2020-10-22 NOTE — Progress Notes (Signed)
Verified D/C home order. Ailene Ravel and Katy Apo Gruel present, reviewed AVS with them, discharge instructions, follow up appointments, and medications.  Message left with Dr. Dorothey Baseman office to call patient's granddaughter and set up procedure in 2 months per order.  VSS, assessment stable.  Discharge from room in wheelchair pushed by volunteer, [name] to take patient home.

## 2020-10-22 NOTE — Discharge Summary (Addendum)
Physician Discharge Summary  Gabriela [name] PRF:[id number] DOB: 1925-12-09 DOA: 10/18/2020  PCP: Jerrol Banana., MD  Admit date: 10/18/2020 Discharge date: 10/22/2020  Admitted From: home Discharge disposition: home   Recommendations for Outpatient Follow-Up:   1. Home health 2. CMP 1 week re: LFTs, CBC as well re: hgb/plts 3. In 2 months needs repeat ERCP and stent removal   Discharge Diagnosis:   Active Problems:   Elevated LFTs   Choledocholithiasis   Common bile duct dilatation   Abnormal findings on imaging of biliary tract    Discharge Condition: Improved.  Diet recommendation: Low sodium, heart healthy.  Low residue  Wound care: None.  Code status: Full.   History of Present Illness:   Gabriela [name] is a 87 y.o. 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  She notes associated n/v/d, chills no fever. She denies cough, uri sxs, or chest pain. She does endorse chronic doe but no worsening of her chronic symptoms. She denies dysuria or flank pain. History take from patient and grand daughter [name] [phone].   Hospital Course by Problem:   Septic shock 2/2 cholangitis, resolved- sepsis was POA --on levophed for short time  Cholangitis/Choledocholithiasis  --CT a/p showed "Cholecystectomy with intra and extrahepatic biliary ductal dilatation. There are least 2 intraluminal densities within the distal common bile duct, more proximal measuring 15 mm. This likely represents choledocholithiasis". --s/p ERCP 3/8, received a stent to common bile  duct --change IV zosyn to PO cipro/flagyl --diet advanced and tolerated --ERCP in 2 months for stent removal.  Gallstone pancreatitis --lipase went from 97 to 647next day, back down to 117 with MIVF. -slowly advance diet  AKI on CKDIII --due to infectionand hypotension --Cr improved with MIVF and low-dose pressor -- encourage oral hydration  Acute on chronic anemia --Hgb dropped from 8.2 to 6.8nextmorning --transfused1u pRBC -outpatient referral to hematology per family request  CAD s/p CABG --cont home ASA  Chronic thrombocytopenia -suspect worsened due to sepsis -monitor -outpatient follow upwith hematology  DMII insulin dependent, well controlled --Hold insulin for now  HLD -check lipids   HTN --resume BB  Glaucoma -no active issues --cont home eye drops  Arthritis/ Failing Right Hip prothesis  -with hx of chronic right thigh wound due to this -followed by out patient orthopedics -supportive care     Medical Consultants:    GI   Discharge Exam:   Vitals:   10/22/20 0716 10/22/20 1133  BP: (!) 156/67 (!) 158/67  Pulse: (!) 57 (!) 55  Resp: 20 16  Temp: 98.2 F (36.8 C) 98.1 F (36.7 C)  SpO2: 100% 100%   Vitals:   10/22/20 0500 10/22/20 0521 10/22/20 0716 10/22/20 1133  BP:  (!) 134/48 (!) 156/67 (!) 158/67  Pulse:  (!) 58 (!) 57 (!) 55  Resp:  _0 Temp:  (!) 97.5 F (36.4 C) 98.2 F (36.8 C) 98.1 F (36.7 C)  TempSrc:  Oral Oral   SpO2:  100% 100% 100%  Weight: 59.1 kg     Height:        General exam: Appears calm and  comfortable.  The results of significant diagnostics from this hospitalization (including imaging, microbiology, ancillary and laboratory) are listed below for reference.     Procedures and Diagnostic Studies:   CT ABDOMEN PELVIS W CONTRAST  Result Date: 10/18/2020 CLINICAL DATA:  87 year old with acute abdominal pain. Elevated LFTs and lipase. Nausea and vomiting. EXAM: CT ABDOMEN AND  PELVIS WITH CONTRAST TECHNIQUE: Multidetector CT imaging of the abdomen and pelvis was performed using the standard protocol following bolus administration of intravenous contrast. CONTRAST:  21m OMNIPAQUE IOHEXOL 300 MG/ML  SOLN COMPARISON:  None. FINDINGS: Lower chest: Small hiatal hernia. Motion artifact in the lung bases limits parenchymal evaluation. Coronary artery calcifications. 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 No pleural fluid. Hepatobiliary: Cholecystectomy. Moderate intrahepatic biliary ductal dilatation. Dilated common bile duct at 17 mm. There are least 2 intraluminal densities within the distal common bile duct, more proximal measuring 15 mm. This likely represents choledocholithiasis, but is nonspecific. No focal hepatic lesion. Pancreas: Pancreas is atrophic. Mild proximal pancreatic ductal dilatation at 5 mm. No definite peripancreatic fat stranding. No discrete pancreatic mass, although evaluation is limited by motion. Spleen: Normal in size without focal abnormality. Adrenals/Urinary Tract: Normal adrenal glands. Bilateral extrarenal pelvis configuration of both kidneys. No perinephric edema. No focal renal abnormality. Probable punctate nonobstructing stone in the upper left kidney. There may be mild bladder wall thickening. Stomach/Bowel: Small hiatal hernia. Mild distal esophageal wall thickening. Stomach otherwise unremarkable. No small bowel obstruction or inflammation. Normal appendix. Majority of the colon is decompressed. No colonic inflammation. Questionable posterior rectal wall thickening measuring up to 2.1 cm. Vascular/Lymphatic: Moderate aortic and branch atherosclerosis. No aortic aneurysm. No enlarged lymph nodes in the abdomen or pelvis. Reproductive: Hysterectomy without adnexal mass. Other: No abdominal ascites.  No free air or focal fluid collection. Musculoskeletal: Right hip arthroplasty. Lobulated lucency adjacent to the acetabular cup, series 2, image 63 with moderate  joint effusion. Advanced left hip osteoarthritis. Multilevel degenerative change in the spine. IMPRESSION: 1. Cholecystectomy with intra and extrahepatic biliary ductal dilatation. There are least 2 intraluminal densities within the distal common bile duct, more proximal measuring 15 mm. This likely represents choledocholithiasis, but is nonspecific. The possibility of polyp or mass is not excluded. Recommend further evaluation with ERCP. MRCP is not recommended in this patient given inability to stay still for CT. 2. Mild proximal pancreatic ductal dilatation at 5 mm, no discrete pancreatic mass, although evaluation is limited by motion. 3. Questionable posterior rectal wall thickening measuring up to 2.1 cm. Recommend correlation with physical exam. 4. Right hip arthroplasty with lucency adjacent to the acetabular cup, most consistent with particle disease. Moderate joint effusion. Findings were also seen on 01/17/2016 pelvis CT. 5. Small hiatal hernia with mild distal esophageal wall thickening, can be seen with reflux or esophagitis. 6. Possible mild bladder wall thickening. 7. Possible punctate nonobstructing stone in the upper left kidney. Aortic Atherosclerosis (ICD10-I70.0). Electronically Signed   By: MKeith RakeM.D.   On: 10/18/2020 22:02   DG Chest Port 1 View  Result Date: 10/19/2020 CLINICAL DATA:  Shortness of breath for 2 days EXAM: PORTABLE CHEST 1 VIEW COMPARISON:  05/19/2018 FINDINGS: Cardiac shadow is enlarged but stable. Postsurgical changes are again seen. Mild vascular congestion is noted without significant interstitial edema. No focal infiltrate is noted. IMPRESSION: Early CHF without significant edema. Electronically Signed   By: MInez CatalinaM.D.   On: 10/19/2020 01:08     Labs:   Basic Metabolic Panel: Recent Labs  Lab 10/19/20 [id number]  10/19/20 1750 10/20/20 0443 10/21/20 0525 10/22/20 0604  NA 139 136 140 140 142  K 3.3* 4.1 3.5 3.4* 4.2  CL 107 109 112* 111 113*   CO2 20* 15* 18* 22 20*  GLUCOSE 158* 167* 102* 91 91  BUN 58* 59* 55* 47* 36*  CREATININE 2.07* 2.20* 1.90* 1.83* 1.51*  CALCIUM 8.2* 7.3* 7.6* 8.1* 8.4*  MG  --   --  1.8 1.9 1.9   GFR Estimated Creatinine Clearance: 20.5 mL/min (A) (by C-G formula based on SCr of 1.51 mg/dL (H)). Liver Function Tests: Recent Labs  Lab 10/19/20 0439 10/19/20 1750 10/20/20 0443 10/21/20 0525 10/22/20 0604  AST 751* 414* 273* 145* 88*  ALT 357* 317* 241* 167* 123*  ALKPHOS 496* 408* 374* 298* 296*  BILITOT 2.5* 3.5* 3.5* 4.4* 4.4*  PROT 7.4 7.1 6.5 6.4* 6.7  ALBUMIN 3.2* 3.1* 2.7* 2.7* 2.6*   Recent Labs  Lab 10/18/20 1933 10/19/20 0428 10/20/20 0443  LIPASE 97* 647* 117*   No results for input(s): AMMONIA in the last 168 hours. Coagulation profile Recent Labs  Lab 10/19/20 0439  INR 1.3*    CBC: Recent Labs  Lab 10/19/20 0439 10/19/20 1750 10/20/20 0443 10/21/20 0525 10/22/20 0604  WBC 25.0* 28.5* 23.3* 15.8* 7.3  HGB 6.8* 8.0* 8.5* 8.4* 8.1*  HCT 21.8* 25.4* 24.9* 25.1* 24.6*  MCV 80.7 83.6 78.5* 77.5* 78.8*  PLT 91* 79* 80* 73* 72*   Cardiac Enzymes: No results for input(s): CKTOTAL, CKMB, CKMBINDEX, TROPONINI in the last 168 hours. BNP: Invalid input(s): POCBNP CBG: Recent Labs  Lab 10/20/20 1906 10/21/20 1638 10/21/20 2119 10/22/20 0733 10/22/20 1127  GLUCAP 107* 113* 98 89 106*   D-Dimer No results for input(s): DDIMER in the last 72 hours. Hgb A1c No results for input(s): HGBA1C in the last 72 hours. Lipid Profile Recent Labs    10/20/20 1654  CHOL 70  HDL 15*  LDLCALC 34  TRIG 106  CHOLHDL 4.7   Thyroid function studies No results for input(s): TSH, T4TOTAL, T3FREE, THYROIDAB in the last 72 hours.  Invalid input(s): FREET3 Anemia work up [employer]    10/20/20 0428 10/20/20 0443  VITAMINB12 666  --   FOLATE  --  12.1  TIBC  --  185*  IRON  --  36   Microbiology Recent Results (from the past 240 hour(s))  SARS CORONAVIRUS 2 (TAT  6-24 HRS) Nasopharyngeal Nasopharyngeal Swab     Status: None   Collection Time: 10/18/20  7:26 PM   Specimen: Nasopharyngeal Swab  Result Value Ref Range Status   SARS Coronavirus 2 NEGATIVE NEGATIVE Final    Comment: (NOTE) SARS-CoV-2 target nucleic acids are NOT DETECTED.  The SARS-CoV-2 RNA is generally detectable in upper and lower respiratory specimens during the acute phase of infection. Negative results do not preclude SARS-CoV-2 infection, do not rule out co-infections with other pathogens, and should not be used as the sole basis for treatment or other patient management decisions. Negative results must be combined with clinical observations, patient history, and epidemiological information. The expected result is Negative.  Fact Sheet for Patients: SugarRoll.be  Fact Sheet for Healthcare Providers: https://www.woods-mathews.com/  This test is not yet approved or cleared by the [place] FDA and  has been authorized for detection and/or diagnosis of SARS-CoV-2 by FDA under an Emergency Use Authorization (EUA). This EUA will remain  in effect (meaning this test can be used) for the duration of the COVID-19 declaration under Se ction  564(b)(1) of the Act, 21 U.S.C. section 360bbb-3(b)(1), unless the authorization is terminated or revoked sooner.  Performed at [facility], [place] [address] [place], [place] [address]   MRSA PCR Screening     Status: None   Collection Time: 10/19/20  4:29 PM   Specimen: Nasal Mucosa; Nasopharyngeal  Result Value Ref Range Status   MRSA by PCR NEGATIVE NEGATIVE Final    Comment:        The GeneXpert MRSA Assay (FDA approved for NASAL specimens only), is one component of a comprehensive MRSA colonization surveillance program. It is not intended to diagnose MRSA infection nor to guide or monitor treatment for MRSA infections. Performed at [facility], [address]  [place], [place] [address]   Culture, blood (routine x 2)     Status: None (Preliminary result)   Collection Time: 10/19/20  5:49 PM   Specimen: BLOOD  Result Value Ref Range Status   Specimen Description BLOOD BLOOD LEFT HAND  Final   Special Requests   Final    BOTTLES DRAWN AEROBIC AND ANAEROBIC Blood Culture adequate volume   Culture   Final    NO GROWTH 3 DAYS Performed at [facility], [address], [place], [place] [address]    Report Status PENDING  Incomplete  Culture, blood (routine x 2)     Status: None (Preliminary result)   Collection Time: 10/19/20  6:53 PM   Specimen: BLOOD  Result Value Ref Range Status   Specimen Description BLOOD BLOOD RIGHT HAND  Final   Special Requests   Final    BOTTLES DRAWN AEROBIC ONLY Blood Culture adequate volume   Culture   Final    NO GROWTH 3 DAYS Performed at [facility], [address], [place], [place] [address]    Report Status PENDING  Incomplete     Discharge Instructions:   Discharge Instructions    Ambulatory referral to Hematology / Oncology   Complete by: As directed    Discharge instructions   Complete by: As directed    soft   Discharge wound care:   Complete by: As directed    Resume prior wound care   Increase activity slowly   Complete by: As directed    No wound care   Complete by: As directed      Allergies as of 10/22/2020      Reactions   Sulfa Antibiotics Rash      Medication List    TAKE these medications   Accu-Chek Aviva Plus test strip Generic drug: glucose blood USE TO TEST SUGAR TWICE A DAY   Accu-Chek Aviva Plus w/Device Kit 1 each by Does not apply route daily. Dx E11.9   Accu-Chek FastClix Lancets Misc To check blood sugars once a day. DX E11.9   aspirin EC 81 MG tablet Take 81 mg by mouth daily.   B-D UF III MINI PEN NEEDLES 31G X 5 MM Misc Generic drug: Insulin Pen Needle USE WITH INSULIN DOSES   ciprofloxacin 500 MG tablet Commonly known as:  CIPRO Take 1 tablet (500 mg total) by mouth daily with breakfast. Start taking on: October 23, 2020   ferrous sulfate 325 (65 FE) MG tablet TAKE 1 TABLET BY MOUTH EVERY OTHER DAY   furosemide 20 MG tablet Commonly known as: LASIX Take 1 tablet (20 mg total) by mouth daily. Start taking on: October 23, 2020   hydrochlorothiazide 25 MG tablet Commonly known as: HYDRODIURIL Take 1 tablet (  25 mg total) by mouth daily.   Levemir FlexTouch 100 UNIT/ML FlexPen Generic drug: insulin detemir INJECT 15 UNITS INTO THE SKIN DAILY. IN THE AFTERNOON   metoprolol tartrate 25 MG tablet Commonly known as: LOPRESSOR TAKE 1/2 TABLET BY MOUTH DAILY   metroNIDAZOLE 500 MG tablet Commonly known as: FLAGYL Take 1 tablet (500 mg total) by mouth 3 (three) times daily.   timolol 0.5 % ophthalmic solution Commonly known as: TIMOPTIC Place 1 drop into both eyes every morning.            Discharge Care Instructions  (From admission, onward)         Start     Ordered   10/22/20 0000  Discharge wound care:       Comments: Resume prior wound care   10/22/20 1416          Follow-up Information    Jerrol Banana., MD Follow up in 1 week(s).   Specialty: Family Medicine Contact information: [address] [place] [address] [phone]        [facility] Follow up.   Specialty: Home Health Services Why: They will follow up with you for your home health needs. Contact information: [place] [address] [phone]                Time coordinating discharge: 35 min  Signed:  Geradine Girt DO  [facility] 10/22/2020, 2:16 PM

## 2020-10-22 NOTE — TOC Initial Note (Signed)
Transition of Care [facility]) - Initial/Assessment Note    Patient Details  Name: Gabriela [name] MRN: [id number] Date of Birth: 16-Nov-1925  Transition of Care [facility]) CM/SW Contact:    Candie Chroman, LCSW Phone Number: 10/22/2020, 1:44 PM  Clinical Narrative: CSW met with patient. Granddaughter at bedside. CSW introduced role and explained that PT recommendations would be discussed. Patient and her granddaughter are agreeable to home health services. No agency preferences. Referral made to and accepted by [facility]. No further concerns. CSW encouraged patient and her granddaughter to contact CSW as needed. CSW will continue to follow patient and her family for support and facilitate return home once discharged. Per granddaughter, they are just waiting on GI to clear her.                 Expected Discharge Plan: [place] Barriers to Discharge: Continued Medical Work up   Patient Goals and CMS Choice     Choice offered to / list presented to : Patient (Granddaughter)  Expected Discharge Plan and Services Expected Discharge Plan: [place] Acute Care Choice: [place] arrangements for the past 2 months: [place]: PT [place] Agency: [facility] Date [place]: 10/22/20   Representative spoke with at [place]: Glennis Brink  Prior Living Arrangements/Services Living arrangements for the past 2 months: [place] Lives with:: Self Patient language and need for interpreter reviewed:: Yes Do you feel safe going back to the place where you live?: Yes      Need for Family Participation in Patient Care: Yes (Comment) Care giver support system in place?: Yes (comment) Current home services: DME,Homehealth aide Criminal Activity/Legal Involvement Pertinent to Current Situation/Hospitalization: No - Comment as needed  Activities of Daily Living Home  Assistive Devices/Equipment: Gilford Rile (specify type) ADL Screening (condition at time of admission) Patient's cognitive ability adequate to safely complete daily activities?: Yes Is the patient deaf or have difficulty hearing?: No Does the patient have difficulty seeing, even when wearing glasses/contacts?: No Does the patient have difficulty concentrating, remembering, or making decisions?: No Patient able to express need for assistance with ADLs?: Yes Does the patient have difficulty dressing or bathing?: No Independently performs ADLs?: Yes (appropriate for developmental age) Does the patient have difficulty walking or climbing stairs?: No Weakness of Legs: None Weakness of Arms/Hands: None  Permission Sought/Granted Permission sought to share information with : Facility Contact Representative,Family Supports Permission granted to share information with : Yes, Verbal Permission Granted  Share Information with NAME: Ailene Ravel  Permission granted to share info w AGENCY: [place] granted to share info w Relationship: Granddaughter  Permission granted to share info w Contact Information: [phone]  Emotional Assessment Appearance:: Appears stated age Attitude/Demeanor/Rapport: Engaged,Gracious Affect (typically observed): Accepting,Appropriate,Calm,Pleasant Orientation: : Oriented to Self,Oriented to Place,Oriented to  Time,Oriented to Situation Alcohol / Substance Use: Not Applicable Psych Involvement: No (comment)  Admission diagnosis:  Choledocholithiasis [K80.50] SOB (shortness of breath) [R06.02] Elevated LFTs [R79.89] Patient Active Problem List   Diagnosis Date Noted  . Choledocholithiasis   . Common bile duct dilatation   . Abnormal findings on imaging of biliary tract   . Elevated LFTs 10/18/2020  . Wound of thigh 03/30/2018  . History of revision of  total replacement of right hip joint 01/20/2016  . Abnormal LFTs (liver function tests)  04/17/2015  . Anemia of diabetes 04/17/2015  . Atherosclerosis of coronary artery 04/17/2015  . Diabetic kidney ([place]) 04/17/2015  . Essential (primary) hypertension 04/17/2015  . Glaucoma 04/17/2015  . HLD (hyperlipidemia) 04/17/2015  . Disorder of kidney 04/17/2015  . Arthritis, degenerative 04/17/2015  . Arthritis, hip 04/26/2013  . Left main coronary artery disease 04/26/2013  . Ischemic mitral regurgitation 04/26/2013  . Hypertension   . Diabetes mellitus ([place])   . Hyperlipidemia    PCP:  Jerrol Banana., MD Pharmacy:   [id number] [place] [id number]: [phone]: [phone] [id number] [id number]: [phone]: [id number]: [phone]: [phone]  Social Determinants of Health (SDOH) Interventions    Readmission Risk Interventions No flowsheet data found.

## 2020-10-22 NOTE — TOC Transition Note (Signed)
Transition of Care [facility]) - CM/SW Discharge Note   Patient Details  Name: Gabriela [name] MRN: [id number] Date of Birth: [birth year]  [facility]) CM/SW Contact:  Candie Chroman, LCSW Phone Number: 10/22/2020, 2:49 PM   Clinical Narrative:  Patient has orders to discharge home today. [facility] is aware. No further concerns. CSW signing off.   Final next level of care: Home w Home Health Services Barriers to Discharge: No Barriers Identified   Patient Goals and CMS Choice     Choice offered to / list presented to : Patient (Granddaughter)  Discharge Placement                Patient to be transferred to facility by: Granddaughter will take her home Name of family member notified: Ailene Ravel Patient and family notified of of transfer: 10/22/20  Discharge Plan and Services     Post Acute Care Choice: [place]: PT [place]: [facility] Date [place]: 10/22/20   Representative spoke with at [place]: Glennis Brink  Social Determinants of Health (SDOH) Interventions     Readmission Risk Interventions No flowsheet data found.

## 2020-10-23 ENCOUNTER — Other Ambulatory Visit: Payer: Self-pay

## 2020-10-23 DIAGNOSIS — I2510 Atherosclerotic heart disease of native coronary artery without angina pectoris: Secondary | ICD-10-CM | POA: Diagnosis not present

## 2020-10-23 DIAGNOSIS — Z4582 Encounter for adjustment or removal of myringotomy device (stent) (tube): Secondary | ICD-10-CM

## 2020-10-23 DIAGNOSIS — Z48815 Encounter for surgical aftercare following surgery on the digestive system: Secondary | ICD-10-CM | POA: Diagnosis not present

## 2020-10-23 DIAGNOSIS — N183 Chronic kidney disease, stage 3 unspecified: Secondary | ICD-10-CM | POA: Diagnosis not present

## 2020-10-23 DIAGNOSIS — E1122 Type 2 diabetes mellitus with diabetic chronic kidney disease: Secondary | ICD-10-CM | POA: Diagnosis not present

## 2020-10-23 DIAGNOSIS — K8050 Calculus of bile duct without cholangitis or cholecystitis without obstruction: Secondary | ICD-10-CM | POA: Diagnosis not present

## 2020-10-23 DIAGNOSIS — I129 Hypertensive chronic kidney disease with stage 1 through stage 4 chronic kidney disease, or unspecified chronic kidney disease: Secondary | ICD-10-CM | POA: Diagnosis not present

## 2020-10-23 DIAGNOSIS — M6281 Muscle weakness (generalized): Secondary | ICD-10-CM | POA: Diagnosis not present

## 2020-10-23 DIAGNOSIS — R932 Abnormal findings on diagnostic imaging of liver and biliary tract: Secondary | ICD-10-CM | POA: Diagnosis not present

## 2020-10-23 DIAGNOSIS — K838 Other specified diseases of biliary tract: Secondary | ICD-10-CM | POA: Diagnosis not present

## 2020-10-23 DIAGNOSIS — Z794 Long term (current) use of insulin: Secondary | ICD-10-CM | POA: Diagnosis not present

## 2020-10-24 LAB — CULTURE, BLOOD (ROUTINE X 2)
Culture: NO GROWTH
Culture: NO GROWTH
Special Requests: ADEQUATE
Special Requests: ADEQUATE

## 2020-10-27 DIAGNOSIS — N183 Chronic kidney disease, stage 3 unspecified: Secondary | ICD-10-CM | POA: Diagnosis not present

## 2020-10-27 DIAGNOSIS — K838 Other specified diseases of biliary tract: Secondary | ICD-10-CM | POA: Diagnosis not present

## 2020-10-27 DIAGNOSIS — K8050 Calculus of bile duct without cholangitis or cholecystitis without obstruction: Secondary | ICD-10-CM | POA: Diagnosis not present

## 2020-10-27 DIAGNOSIS — E1122 Type 2 diabetes mellitus with diabetic chronic kidney disease: Secondary | ICD-10-CM | POA: Diagnosis not present

## 2020-10-27 DIAGNOSIS — I2510 Atherosclerotic heart disease of native coronary artery without angina pectoris: Secondary | ICD-10-CM | POA: Diagnosis not present

## 2020-10-27 DIAGNOSIS — Z794 Long term (current) use of insulin: Secondary | ICD-10-CM | POA: Diagnosis not present

## 2020-10-27 DIAGNOSIS — M6281 Muscle weakness (generalized): Secondary | ICD-10-CM | POA: Diagnosis not present

## 2020-10-27 DIAGNOSIS — I129 Hypertensive chronic kidney disease with stage 1 through stage 4 chronic kidney disease, or unspecified chronic kidney disease: Secondary | ICD-10-CM | POA: Diagnosis not present

## 2020-10-27 DIAGNOSIS — R932 Abnormal findings on diagnostic imaging of liver and biliary tract: Secondary | ICD-10-CM | POA: Diagnosis not present

## 2020-10-27 DIAGNOSIS — Z48815 Encounter for surgical aftercare following surgery on the digestive system: Secondary | ICD-10-CM | POA: Diagnosis not present

## 2020-10-28 ENCOUNTER — Encounter: Payer: Self-pay | Admitting: Family Medicine

## 2020-10-28 ENCOUNTER — Ambulatory Visit (INDEPENDENT_AMBULATORY_CARE_PROVIDER_SITE_OTHER): Payer: Medicare Other | Admitting: Family Medicine

## 2020-10-28 ENCOUNTER — Other Ambulatory Visit: Payer: Self-pay

## 2020-10-28 VITALS — BP 128/66 | HR 71 | Temp 98.3°F | Resp 20 | Wt 126.0 lb

## 2020-10-28 DIAGNOSIS — D649 Anemia, unspecified: Secondary | ICD-10-CM

## 2020-10-28 DIAGNOSIS — I10 Essential (primary) hypertension: Secondary | ICD-10-CM | POA: Diagnosis not present

## 2020-10-28 DIAGNOSIS — K8050 Calculus of bile duct without cholangitis or cholecystitis without obstruction: Secondary | ICD-10-CM

## 2020-10-28 DIAGNOSIS — E1159 Type 2 diabetes mellitus with other circulatory complications: Secondary | ICD-10-CM | POA: Diagnosis not present

## 2020-10-28 DIAGNOSIS — I2510 Atherosclerotic heart disease of native coronary artery without angina pectoris: Secondary | ICD-10-CM | POA: Diagnosis not present

## 2020-10-28 DIAGNOSIS — E1121 Type 2 diabetes mellitus with diabetic nephropathy: Secondary | ICD-10-CM | POA: Diagnosis not present

## 2020-10-28 DIAGNOSIS — E559 Vitamin D deficiency, unspecified: Secondary | ICD-10-CM

## 2020-10-28 DIAGNOSIS — N289 Disorder of kidney and ureter, unspecified: Secondary | ICD-10-CM

## 2020-10-28 NOTE — Progress Notes (Signed)
Established patient visit   [name]   DOB: May 28, 1926   87 y.o. Female  MRN: [id number] Visit Date: 10/28/2020  Today's healthcare provider: Wilhemena Durie, MD   Chief Complaint  Patient presents with  . Hospitalization Follow-up   Subjective    HPI  Follow up Hospitalization  Patient was admitted to [facility] on 10/18/2020 and discharged on 10/22/2020. She was treated for; Choledocholithiasis, Elevated LFTs, Common bile duct dilatation, and Abnormal findings on imaging of biliary tract.  During the hospitalization she also had dyspnea aggravated by anemia due to chronic kidney disease Treatment for this included; see notes in chart. Telephone follow up was done on none She reports good compliance with treatment. She reports this condition is improved.  Of note is that her daughter who was her primary caregiver died suddenly of heart attack during this time .  Her granddaughter brings her in today.  She is now living with the patient     Medications: Outpatient Medications Prior to Visit  Medication Sig  . aspirin EC 81 MG tablet Take 81 mg by mouth daily.  . Blood Glucose Monitoring Suppl (ACCU-CHEK AVIVA PLUS) w/Device KIT 1 each by Does not apply route daily. Dx E11.9  . ACCU-CHEK AVIVA PLUS test strip USE TO TEST SUGAR TWICE A DAY  . ACCU-CHEK FASTCLIX LANCETS MISC To check blood sugars once a day. DX E11.9  . ciprofloxacin (CIPRO) 500 MG tablet Take 1 tablet (500 mg total) by mouth daily with breakfast.  . ferrous sulfate 325 (65 FE) MG tablet TAKE 1 TABLET BY MOUTH EVERY OTHER DAY  . furosemide (LASIX) 20 MG tablet Take 1 tablet (20 mg total) by mouth daily.  . hydrochlorothiazide (HYDRODIURIL) 25 MG tablet Take 1 tablet (25 mg total) by mouth daily.  . Insulin Pen Needle (B-D UF III MINI PEN NEEDLES) 31G X 5 MM MISC USE WITH INSULIN DOSES  . LEVEMIR FLEXTOUCH 100 UNIT/ML FlexPen INJECT 15 UNITS INTO THE SKIN DAILY. IN THE AFTERNOON  . metoprolol  tartrate (LOPRESSOR) 25 MG tablet TAKE 1/2 TABLET BY MOUTH DAILY  . metroNIDAZOLE (FLAGYL) 500 MG tablet Take 1 tablet (500 mg total) by mouth 3 (three) times daily.  . timolol (TIMOPTIC) 0.5 % ophthalmic solution Place 1 drop into both eyes every morning.   No facility-administered medications prior to visit.    Review of Systems  Constitutional: Negative for appetite change, chills, fatigue and fever.  Respiratory: Negative for chest tightness and shortness of breath.   Cardiovascular: Negative for chest pain and palpitations.  Gastrointestinal: Negative for abdominal pain, nausea and vomiting.  Neurological: Negative for dizziness and weakness.        Objective    BP 128/66   Pulse 71   Temp 98.3 F (36.8 C)   Resp 20   Wt 126 lb (57.2 kg)   BMI 21.63 kg/m  BP Readings from Last 3 Encounters:  10/28/20 128/66  10/22/20 (!) 158/67  07/28/20 140/68   Wt Readings from Last 3 Encounters:  10/28/20 126 lb (57.2 kg)  10/22/20 130 lb 4.7 oz (59.1 kg)  07/28/20 123 lb (55.8 kg)       Physical Exam Vitals and nursing note reviewed.  Constitutional:      Appearance: Normal appearance. She is normal weight.  HENT:     Right Ear: Tympanic membrane normal.     Left Ear: Tympanic membrane normal.     Nose: Nose normal.  Mouth/Throat:     Mouth: Mucous membranes are moist.     Pharynx: Oropharynx is clear.  Eyes:     Conjunctiva/sclera: Conjunctivae normal.  Cardiovascular:     Rate and Rhythm: Normal rate and regular rhythm.     Pulses: Normal pulses.     Heart sounds: Normal heart sounds.  Pulmonary:     Effort: Pulmonary effort is normal.     Breath sounds: Normal breath sounds.  Abdominal:     General: Bowel sounds are normal.     Palpations: Abdomen is soft.  Musculoskeletal:     Cervical back: Normal range of motion and neck supple.     Right lower leg: No edema.     Left lower leg: No edema.  Skin:    General: Skin is warm and dry.  Neurological:      General: No focal deficit present.     Mental Status: She is alert and oriented to person, place, and time.  Psychiatric:        Mood and Affect: Mood normal.        Behavior: Behavior normal.        Thought Content: Thought content normal.        Judgment: Judgment normal.       No results found for any visits on 10/28/20.  Assessment & Plan     1. Choledocholithiasis For follow-up ERCP in a couple of months. - CBC with Differential/Platelet  2. Essential (primary) hypertension Good control - Comprehensive metabolic panel - TSH  3. Anemia, unspecified type Follow-up CBC for anemia of chronic disease secondary to CKD - CBC with Differential/Platelet  4. Vitamin D deficiency  - VITAMIN D 25 Hydroxy (Vit-D Deficiency, Fractures)  5. Type 2 diabetes mellitus with other circulatory complication, unspecified whether long term insulin use ([place])   6. Diabetic nephropathy associated with type 2 diabetes mellitus ([place])   7. Disorder of kidney   8. Left main coronary artery disease That is post CABG.  Continue low-dose aspirin.   No follow-ups on file.      I, Wilhemena Durie, MD, have reviewed all documentation for this visit. The documentation on 11/01/20 for the exam, diagnosis, procedures, and orders are all accurate and complete.    Gabriela Tasso Cranford Mon, MD  [facility] [phone] (phone) [phone] (fax)  [place]

## 2020-10-29 ENCOUNTER — Inpatient Hospital Stay: Payer: Medicare Other | Admitting: Family Medicine

## 2020-10-29 DIAGNOSIS — Z48815 Encounter for surgical aftercare following surgery on the digestive system: Secondary | ICD-10-CM | POA: Diagnosis not present

## 2020-10-29 DIAGNOSIS — I129 Hypertensive chronic kidney disease with stage 1 through stage 4 chronic kidney disease, or unspecified chronic kidney disease: Secondary | ICD-10-CM | POA: Diagnosis not present

## 2020-10-29 DIAGNOSIS — K8050 Calculus of bile duct without cholangitis or cholecystitis without obstruction: Secondary | ICD-10-CM | POA: Diagnosis not present

## 2020-10-29 DIAGNOSIS — E1122 Type 2 diabetes mellitus with diabetic chronic kidney disease: Secondary | ICD-10-CM | POA: Diagnosis not present

## 2020-10-29 DIAGNOSIS — N183 Chronic kidney disease, stage 3 unspecified: Secondary | ICD-10-CM | POA: Diagnosis not present

## 2020-10-29 DIAGNOSIS — Z794 Long term (current) use of insulin: Secondary | ICD-10-CM | POA: Diagnosis not present

## 2020-10-29 DIAGNOSIS — R932 Abnormal findings on diagnostic imaging of liver and biliary tract: Secondary | ICD-10-CM | POA: Diagnosis not present

## 2020-10-29 DIAGNOSIS — K838 Other specified diseases of biliary tract: Secondary | ICD-10-CM | POA: Diagnosis not present

## 2020-10-29 DIAGNOSIS — I2510 Atherosclerotic heart disease of native coronary artery without angina pectoris: Secondary | ICD-10-CM | POA: Diagnosis not present

## 2020-10-29 DIAGNOSIS — M6281 Muscle weakness (generalized): Secondary | ICD-10-CM | POA: Diagnosis not present

## 2020-10-29 LAB — COMPREHENSIVE METABOLIC PANEL
ALT: 40 IU/L — ABNORMAL HIGH (ref 0–32)
AST: 33 IU/L (ref 0–40)
Albumin/Globulin Ratio: 0.9 — ABNORMAL LOW (ref 1.2–2.2)
Albumin: 3.9 g/dL (ref 3.5–4.6)
Alkaline Phosphatase: 393 IU/L — ABNORMAL HIGH (ref 44–121)
BUN/Creatinine Ratio: 16 (ref 12–28)
BUN: 27 mg/dL (ref 10–36)
Bilirubin Total: 1.1 mg/dL (ref 0.0–1.2)
CO2: 15 mmol/L — ABNORMAL LOW (ref 20–29)
Calcium: 8.4 mg/dL — ABNORMAL LOW (ref 8.7–10.3)
Chloride: 106 mmol/L (ref 96–106)
Creatinine, Ser: 1.69 mg/dL — ABNORMAL HIGH (ref 0.57–1.00)
Globulin, Total: 4.3 g/dL (ref 1.5–4.5)
Glucose: 167 mg/dL — ABNORMAL HIGH (ref 65–99)
Potassium: 4.2 mmol/L (ref 3.5–5.2)
Sodium: 139 mmol/L (ref 134–144)
Total Protein: 8.2 g/dL (ref 6.0–8.5)
eGFR: 28 mL/min/{1.73_m2} — ABNORMAL LOW (ref >59)

## 2020-10-29 LAB — CBC WITH DIFFERENTIAL/PLATELET
Basophils Absolute: 0 10*3/uL (ref 0.0–0.2)
Basos: 0 %
EOS (ABSOLUTE): 0 10*3/uL (ref 0.0–0.4)
Eos: 0 %
Hematocrit: 27.7 % — ABNORMAL LOW (ref 34.0–46.6)
Hemoglobin: 9 g/dL — ABNORMAL LOW (ref 11.1–15.9)
Immature Grans (Abs): 0.1 10*3/uL (ref 0.0–0.1)
Immature Granulocytes: 1 %
Lymphocytes Absolute: 2.5 10*3/uL (ref 0.7–3.1)
Lymphs: 36 %
MCH: 26 pg — ABNORMAL LOW (ref 26.6–33.0)
MCHC: 32.5 g/dL (ref 31.5–35.7)
MCV: 80 fL (ref 79–97)
Monocytes Absolute: 0.7 10*3/uL (ref 0.1–0.9)
Monocytes: 10 %
Neutrophils Absolute: 3.6 10*3/uL (ref 1.4–7.0)
Neutrophils: 53 %
Platelets: 147 10*3/uL — ABNORMAL LOW (ref 150–450)
RBC: 3.46 x10E6/uL — ABNORMAL LOW (ref 3.77–5.28)
RDW: 16.4 % — ABNORMAL HIGH (ref 11.7–15.4)
WBC: 6.8 10*3/uL (ref 3.4–10.8)

## 2020-10-29 LAB — VITAMIN D 25 HYDROXY (VIT D DEFICIENCY, FRACTURES): Vit D, 25-Hydroxy: 7 ng/mL — ABNORMAL LOW (ref 30.0–100.0)

## 2020-10-29 LAB — TSH: TSH: 4.07 u[IU]/mL (ref 0.450–4.500)

## 2020-11-03 DIAGNOSIS — E1122 Type 2 diabetes mellitus with diabetic chronic kidney disease: Secondary | ICD-10-CM | POA: Diagnosis not present

## 2020-11-03 DIAGNOSIS — K838 Other specified diseases of biliary tract: Secondary | ICD-10-CM | POA: Diagnosis not present

## 2020-11-03 DIAGNOSIS — I129 Hypertensive chronic kidney disease with stage 1 through stage 4 chronic kidney disease, or unspecified chronic kidney disease: Secondary | ICD-10-CM | POA: Diagnosis not present

## 2020-11-03 DIAGNOSIS — Z794 Long term (current) use of insulin: Secondary | ICD-10-CM | POA: Diagnosis not present

## 2020-11-03 DIAGNOSIS — I2510 Atherosclerotic heart disease of native coronary artery without angina pectoris: Secondary | ICD-10-CM | POA: Diagnosis not present

## 2020-11-03 DIAGNOSIS — N183 Chronic kidney disease, stage 3 unspecified: Secondary | ICD-10-CM | POA: Diagnosis not present

## 2020-11-03 DIAGNOSIS — Z48815 Encounter for surgical aftercare following surgery on the digestive system: Secondary | ICD-10-CM | POA: Diagnosis not present

## 2020-11-03 DIAGNOSIS — K8050 Calculus of bile duct without cholangitis or cholecystitis without obstruction: Secondary | ICD-10-CM | POA: Diagnosis not present

## 2020-11-03 DIAGNOSIS — M6281 Muscle weakness (generalized): Secondary | ICD-10-CM | POA: Diagnosis not present

## 2020-11-03 DIAGNOSIS — R932 Abnormal findings on diagnostic imaging of liver and biliary tract: Secondary | ICD-10-CM | POA: Diagnosis not present

## 2020-11-04 ENCOUNTER — Inpatient Hospital Stay: Payer: Medicare Other

## 2020-11-04 ENCOUNTER — Inpatient Hospital Stay: Payer: Medicare Other | Admitting: Oncology

## 2020-11-06 DIAGNOSIS — K838 Other specified diseases of biliary tract: Secondary | ICD-10-CM | POA: Diagnosis not present

## 2020-11-06 DIAGNOSIS — M6281 Muscle weakness (generalized): Secondary | ICD-10-CM | POA: Diagnosis not present

## 2020-11-06 DIAGNOSIS — I129 Hypertensive chronic kidney disease with stage 1 through stage 4 chronic kidney disease, or unspecified chronic kidney disease: Secondary | ICD-10-CM | POA: Diagnosis not present

## 2020-11-06 DIAGNOSIS — K8050 Calculus of bile duct without cholangitis or cholecystitis without obstruction: Secondary | ICD-10-CM | POA: Diagnosis not present

## 2020-11-06 DIAGNOSIS — N183 Chronic kidney disease, stage 3 unspecified: Secondary | ICD-10-CM | POA: Diagnosis not present

## 2020-11-06 DIAGNOSIS — Z48815 Encounter for surgical aftercare following surgery on the digestive system: Secondary | ICD-10-CM | POA: Diagnosis not present

## 2020-11-06 DIAGNOSIS — E1122 Type 2 diabetes mellitus with diabetic chronic kidney disease: Secondary | ICD-10-CM | POA: Diagnosis not present

## 2020-11-06 DIAGNOSIS — R932 Abnormal findings on diagnostic imaging of liver and biliary tract: Secondary | ICD-10-CM | POA: Diagnosis not present

## 2020-11-06 DIAGNOSIS — Z794 Long term (current) use of insulin: Secondary | ICD-10-CM | POA: Diagnosis not present

## 2020-11-06 DIAGNOSIS — I2510 Atherosclerotic heart disease of native coronary artery without angina pectoris: Secondary | ICD-10-CM | POA: Diagnosis not present

## 2020-11-10 ENCOUNTER — Inpatient Hospital Stay: Payer: Medicare Other | Admitting: Oncology

## 2020-11-10 ENCOUNTER — Inpatient Hospital Stay: Payer: Medicare Other

## 2020-11-11 DIAGNOSIS — N183 Chronic kidney disease, stage 3 unspecified: Secondary | ICD-10-CM | POA: Diagnosis not present

## 2020-11-11 DIAGNOSIS — R932 Abnormal findings on diagnostic imaging of liver and biliary tract: Secondary | ICD-10-CM | POA: Diagnosis not present

## 2020-11-11 DIAGNOSIS — K8050 Calculus of bile duct without cholangitis or cholecystitis without obstruction: Secondary | ICD-10-CM | POA: Diagnosis not present

## 2020-11-11 DIAGNOSIS — I129 Hypertensive chronic kidney disease with stage 1 through stage 4 chronic kidney disease, or unspecified chronic kidney disease: Secondary | ICD-10-CM | POA: Diagnosis not present

## 2020-11-11 DIAGNOSIS — Z48815 Encounter for surgical aftercare following surgery on the digestive system: Secondary | ICD-10-CM | POA: Diagnosis not present

## 2020-11-11 DIAGNOSIS — M6281 Muscle weakness (generalized): Secondary | ICD-10-CM | POA: Diagnosis not present

## 2020-11-11 DIAGNOSIS — I2510 Atherosclerotic heart disease of native coronary artery without angina pectoris: Secondary | ICD-10-CM | POA: Diagnosis not present

## 2020-11-11 DIAGNOSIS — E1122 Type 2 diabetes mellitus with diabetic chronic kidney disease: Secondary | ICD-10-CM | POA: Diagnosis not present

## 2020-11-11 DIAGNOSIS — Z794 Long term (current) use of insulin: Secondary | ICD-10-CM | POA: Diagnosis not present

## 2020-11-11 DIAGNOSIS — K838 Other specified diseases of biliary tract: Secondary | ICD-10-CM | POA: Diagnosis not present

## 2020-11-13 DIAGNOSIS — E1122 Type 2 diabetes mellitus with diabetic chronic kidney disease: Secondary | ICD-10-CM | POA: Diagnosis not present

## 2020-11-13 DIAGNOSIS — N183 Chronic kidney disease, stage 3 unspecified: Secondary | ICD-10-CM | POA: Diagnosis not present

## 2020-11-13 DIAGNOSIS — K838 Other specified diseases of biliary tract: Secondary | ICD-10-CM | POA: Diagnosis not present

## 2020-11-13 DIAGNOSIS — M6281 Muscle weakness (generalized): Secondary | ICD-10-CM | POA: Diagnosis not present

## 2020-11-13 DIAGNOSIS — I129 Hypertensive chronic kidney disease with stage 1 through stage 4 chronic kidney disease, or unspecified chronic kidney disease: Secondary | ICD-10-CM | POA: Diagnosis not present

## 2020-11-13 DIAGNOSIS — I2510 Atherosclerotic heart disease of native coronary artery without angina pectoris: Secondary | ICD-10-CM | POA: Diagnosis not present

## 2020-11-13 DIAGNOSIS — Z794 Long term (current) use of insulin: Secondary | ICD-10-CM | POA: Diagnosis not present

## 2020-11-13 DIAGNOSIS — R932 Abnormal findings on diagnostic imaging of liver and biliary tract: Secondary | ICD-10-CM | POA: Diagnosis not present

## 2020-11-13 DIAGNOSIS — K8050 Calculus of bile duct without cholangitis or cholecystitis without obstruction: Secondary | ICD-10-CM | POA: Diagnosis not present

## 2020-11-13 DIAGNOSIS — Z48815 Encounter for surgical aftercare following surgery on the digestive system: Secondary | ICD-10-CM | POA: Diagnosis not present

## 2020-11-16 DIAGNOSIS — R932 Abnormal findings on diagnostic imaging of liver and biliary tract: Secondary | ICD-10-CM | POA: Diagnosis not present

## 2020-11-16 DIAGNOSIS — I2510 Atherosclerotic heart disease of native coronary artery without angina pectoris: Secondary | ICD-10-CM | POA: Diagnosis not present

## 2020-11-16 DIAGNOSIS — M6281 Muscle weakness (generalized): Secondary | ICD-10-CM | POA: Diagnosis not present

## 2020-11-16 DIAGNOSIS — I129 Hypertensive chronic kidney disease with stage 1 through stage 4 chronic kidney disease, or unspecified chronic kidney disease: Secondary | ICD-10-CM | POA: Diagnosis not present

## 2020-11-16 DIAGNOSIS — Z48815 Encounter for surgical aftercare following surgery on the digestive system: Secondary | ICD-10-CM | POA: Diagnosis not present

## 2020-11-16 DIAGNOSIS — K838 Other specified diseases of biliary tract: Secondary | ICD-10-CM | POA: Diagnosis not present

## 2020-11-16 DIAGNOSIS — N183 Chronic kidney disease, stage 3 unspecified: Secondary | ICD-10-CM | POA: Diagnosis not present

## 2020-11-16 DIAGNOSIS — K8050 Calculus of bile duct without cholangitis or cholecystitis without obstruction: Secondary | ICD-10-CM | POA: Diagnosis not present

## 2020-11-16 DIAGNOSIS — E1122 Type 2 diabetes mellitus with diabetic chronic kidney disease: Secondary | ICD-10-CM | POA: Diagnosis not present

## 2020-11-16 DIAGNOSIS — Z794 Long term (current) use of insulin: Secondary | ICD-10-CM | POA: Diagnosis not present

## 2020-11-18 DIAGNOSIS — N183 Chronic kidney disease, stage 3 unspecified: Secondary | ICD-10-CM | POA: Diagnosis not present

## 2020-11-18 DIAGNOSIS — I2510 Atherosclerotic heart disease of native coronary artery without angina pectoris: Secondary | ICD-10-CM | POA: Diagnosis not present

## 2020-11-18 DIAGNOSIS — R932 Abnormal findings on diagnostic imaging of liver and biliary tract: Secondary | ICD-10-CM | POA: Diagnosis not present

## 2020-11-18 DIAGNOSIS — I129 Hypertensive chronic kidney disease with stage 1 through stage 4 chronic kidney disease, or unspecified chronic kidney disease: Secondary | ICD-10-CM | POA: Diagnosis not present

## 2020-11-18 DIAGNOSIS — Z48815 Encounter for surgical aftercare following surgery on the digestive system: Secondary | ICD-10-CM | POA: Diagnosis not present

## 2020-11-18 DIAGNOSIS — K838 Other specified diseases of biliary tract: Secondary | ICD-10-CM | POA: Diagnosis not present

## 2020-11-18 DIAGNOSIS — Z794 Long term (current) use of insulin: Secondary | ICD-10-CM | POA: Diagnosis not present

## 2020-11-18 DIAGNOSIS — E1122 Type 2 diabetes mellitus with diabetic chronic kidney disease: Secondary | ICD-10-CM | POA: Diagnosis not present

## 2020-11-18 DIAGNOSIS — M6281 Muscle weakness (generalized): Secondary | ICD-10-CM | POA: Diagnosis not present

## 2020-11-18 DIAGNOSIS — K8050 Calculus of bile duct without cholangitis or cholecystitis without obstruction: Secondary | ICD-10-CM | POA: Diagnosis not present

## 2020-11-23 ENCOUNTER — Inpatient Hospital Stay: Payer: Medicare Other

## 2020-11-23 ENCOUNTER — Inpatient Hospital Stay: Payer: Medicare Other | Attending: Oncology | Admitting: Oncology

## 2020-11-23 ENCOUNTER — Encounter: Payer: Self-pay | Admitting: Oncology

## 2020-11-23 VITALS — BP 122/60 | HR 60 | Temp 97.8°F | Resp 16 | Wt 121.1 lb

## 2020-11-23 DIAGNOSIS — D631 Anemia in chronic kidney disease: Secondary | ICD-10-CM | POA: Insufficient documentation

## 2020-11-23 DIAGNOSIS — R7989 Other specified abnormal findings of blood chemistry: Secondary | ICD-10-CM | POA: Insufficient documentation

## 2020-11-23 DIAGNOSIS — R748 Abnormal levels of other serum enzymes: Secondary | ICD-10-CM | POA: Diagnosis not present

## 2020-11-23 DIAGNOSIS — N183 Chronic kidney disease, stage 3 unspecified: Secondary | ICD-10-CM

## 2020-11-23 LAB — CBC WITH DIFFERENTIAL/PLATELET
Abs Immature Granulocytes: 0.32 10*3/uL — ABNORMAL HIGH (ref 0.00–0.07)
Basophils Absolute: 0 10*3/uL (ref 0.0–0.1)
Basophils Relative: 1 %
Eosinophils Absolute: 0 10*3/uL (ref 0.0–0.5)
Eosinophils Relative: 1 %
HCT: 30 % — ABNORMAL LOW (ref 36.0–46.0)
Hemoglobin: 9.5 g/dL — ABNORMAL LOW (ref 12.0–15.0)
Immature Granulocytes: 6 %
Lymphocytes Relative: 44 %
Lymphs Abs: 2.4 10*3/uL (ref 0.7–4.0)
MCH: 25.5 pg — ABNORMAL LOW (ref 26.0–34.0)
MCHC: 31.7 g/dL (ref 30.0–36.0)
MCV: 80.6 fL (ref 80.0–100.0)
Monocytes Absolute: 0.5 10*3/uL (ref 0.1–1.0)
Monocytes Relative: 9 %
Neutro Abs: 2.1 10*3/uL (ref 1.7–7.7)
Neutrophils Relative %: 39 %
Platelets: 91 10*3/uL — ABNORMAL LOW (ref 150–400)
RBC: 3.72 MIL/uL — ABNORMAL LOW (ref 3.87–5.11)
RDW: 17.8 % — ABNORMAL HIGH (ref 11.5–15.5)
Smear Review: NORMAL
WBC: 5.4 10*3/uL (ref 4.0–10.5)
nRBC: 0 % (ref 0.0–0.2)

## 2020-11-23 LAB — IRON AND TIBC
Iron: 77 ug/dL (ref 28–170)
Saturation Ratios: 28 % (ref 10.4–31.8)
TIBC: 274 ug/dL (ref 250–450)
UIBC: 197 ug/dL

## 2020-11-23 LAB — TECHNOLOGIST SMEAR REVIEW

## 2020-11-23 LAB — LACTATE DEHYDROGENASE: LDH: 153 U/L (ref 98–192)

## 2020-11-23 LAB — RETIC PANEL
Immature Retic Fract: 5.6 % (ref 2.3–15.9)
RBC.: 3.73 MIL/uL — ABNORMAL LOW (ref 3.87–5.11)
Retic Count, Absolute: 21.6 10*3/uL (ref 19.0–186.0)
Retic Ct Pct: 0.6 % (ref 0.4–3.1)
Reticulocyte Hemoglobin: 29 pg (ref >27.9)

## 2020-11-23 LAB — FERRITIN: Ferritin: 402 ng/mL — ABNORMAL HIGH (ref 11–307)

## 2020-11-23 NOTE — Progress Notes (Signed)
Hematology/Oncology Consult note [facility], [place] [phone] [phone]   Patient Care Team: Jerrol Banana., MD as PCP - General (Family Medicine) Corey Skains, MD as Referring Physician (Internal Medicine) Lorelee Cover., MD (Ophthalmology)  REFERRING PROVIDER: Geradine Girt, DO  CHIEF COMPLAINTS/REASON FOR VISIT:  Evaluation of anemia  HISTORY OF PRESENTING ILLNESS:  Gabriela [name] is a  87 y.o.  female with PMH listed below who was referred to me for evaluation of anemia Reviewed patient's recent labs that was done.  Labs revealed anemia with hemoglobin of 9, mcv 80.  .   Reviewed patient's previous labs ordered by primary care physician's office, anemia is chronic onset , duration is since at least 2014 with baseline 8-9s.  Patient was admitted from 10/18/20-10/22/20 due to cholangitis/choledocholithiasis/gallstone pancreatitis/acute anemia Patient is status post ERCP with a stent to the common bile duct.  Patient was treated with antibiotics. Her hemoglobin dropped to 6.8 and was transfused 1 unit of PRBC.  Patient was referred to establish care with hematology for further evaluation of anemia and management. Patient's chart says patient has a legal guardian.  Patient was accompanied by her granddaughter Gabriela [name] who is not able to come to the examination room because she brought her kit today and the cancer center policy does not allow children in the cancer center.Gabriela [name] has to stay downstairs with her gait.  Associated signs and symptoms: Patient reports fatigue. deneis SOB with exertion.  Denies weight loss, easy bruising, hematochezia, hemoptysis, hematuria.  Patient denies any pain, fever or chills.  Review of Systems  Constitutional: Positive for fatigue. Negative for appetite change, chills and fever.  HENT:   Negative for hearing loss and voice change.   Eyes: Negative for eye problems.  Respiratory: Negative for chest  tightness and cough.   Cardiovascular: Negative for chest pain.  Gastrointestinal: Negative for abdominal distention, abdominal pain and blood in stool.  Endocrine: Negative for hot flashes.  Genitourinary: Negative for difficulty urinating and frequency.   Musculoskeletal: Negative for arthralgias.  Skin: Negative for itching and rash.  Neurological: Negative for extremity weakness.  Hematological: Negative for adenopathy.  Psychiatric/Behavioral: Negative for confusion.     MEDICAL HISTORY:  Past Medical History:  Diagnosis Date  . Arthritis   . Diabetes mellitus ([place])   . Glaucoma   . Hyperlipidemia   . Hypertension   . Shortness of breath    with exertion    SURGICAL HISTORY: Past Surgical History:  Procedure Laterality Date  . CARDIAC CATHETERIZATION  04/22/13  . CHOLECYSTECTOMY    . CORONARY ARTERY BYPASS GRAFT N/A 05/31/2013   Procedure: Coronary Artery Bypass Grafting Times Three Using Left Internal Mammary Artery and Right Saphenous Leg Vein Harvested Endoscopically;  Surgeon: Ivin Poot, MD;  Location: [place];  Service: Open Heart Surgery;  Laterality: N/A;  . ERCP N/A 10/20/2020   Procedure: ENDOSCOPIC RETROGRADE CHOLANGIOPANCREATOGRAPHY (ERCP);  Surgeon: Lucilla Lame, MD;  Location[facility] END[facility]COPY;  Service: Endoscopy;  Laterality: N/A;  . EYE SURGERY    . HIP SURGERY     right  . INTRAOPERATIVE TRANSESOPHAGEAL ECHOCARDIOGRAM N/A 05/31/2013   Procedure: INTRAOPERATIVE TRANSESOPHAGEAL ECHOCARDIOGRAM;  Surgeon: Ivin Poot, MD;  Location: [place];  Service: Open Heart Surgery;  Laterality: N/A;  . JOINT REPLACEMENT    . MITRAL VALVE REPAIR N/A 05/31/2013   Procedure: MITRAL VALVE REPAIR (MVR);  Surgeon: Ivin Poot, MD;  Location: [place];  Service: Open Heart Surgery;  Laterality:  N/A;  . TONSILLECTOMY      SOCIAL HISTORY: Social History   Socioeconomic History  . Marital status: Widowed    Spouse name: Not on file  . Number of children: 7  . Years  of education: Not on file  . Highest education level: 11th grade  Occupational History  . Occupation: retired  Tobacco Use  . Smoking status: Never Smoker  . Smokeless tobacco: Never Used  Vaping Use  . Vaping Use: Never used  Substance and Sexual Activity  . Alcohol use: No  . Drug use: No  . Sexual activity: Never  Other Topics Concern  . Not on file  Social History Narrative  . Not on file   Social Determinants of Health   Financial Resource Strain: Low Risk   . Difficulty of Paying Living Expenses: Not hard at all  Food Insecurity: No Food Insecurity  . Worried About Charity fundraiser in the Last Year: Never true  . Ran Out of Food in the Last Year: Never true  Transportation Needs: No Transportation Needs  . Lack of Transportation (Medical): No  . Lack of Transportation (Non-Medical): No  Physical Activity: Inactive  . Days of Exercise per Week: 0 days  . Minutes of Exercise per Session: 0 min  Stress: No Stress Concern Present  . Feeling of Stress : Not at all  Social Connections: Moderately Isolated  . Frequency of Communication with Friends and Family: More than three times a week  . Frequency of Social Gatherings with Friends and Family: More than three times a week  . Attends Religious Services: More than 4 times per year  . Active Member of Clubs or Organizations: No  . Attends Archivist Meetings: Never  . Marital Status: Widowed  Intimate Partner Violence: Not At Risk  . Fear of Current or Ex-Partner: No  . Emotionally Abused: No  . Physically Abused: No  . Sexually Abused: No    FAMILY HISTORY: Family History  Problem Relation Age of Onset  . Diabetes Sister   . Hypertension Mother     ALLERGIES:  is allergic to sulfa antibiotics.  MEDICATIONS:  Current Outpatient Medications  Medication Sig Dispense Refill  . ACCU-CHEK AVIVA PLUS test strip USE TO TEST SUGAR TWICE A DAY 100 strip 1  . ACCU-CHEK FASTCLIX LANCETS MISC To check blood  sugars once a day. DX E11.9 102 each 12  . Blood Glucose Monitoring Suppl (ACCU-CHEK AVIVA PLUS) w/Device KIT 1 each by Does not apply route daily. Dx E11.9 1 kit 0  . furosemide (LASIX) 20 MG tablet Take 1 tablet (20 mg total) by mouth daily.    . hydrochlorothiazide (HYDRODIURIL) 25 MG tablet Take 1 tablet (25 mg total) by mouth daily. 90 tablet 1  . Insulin Pen Needle (B-D UF III MINI PEN NEEDLES) 31G X 5 MM MISC USE WITH INSULIN DOSES 100 each 5  . LEVEMIR FLEXTOUCH 100 UNIT/ML FlexPen INJECT 15 UNITS INTO THE SKIN DAILY. IN THE AFTERNOON 15 mL 5  . metoprolol tartrate (LOPRESSOR) 25 MG tablet TAKE 1/2 TABLET BY MOUTH DAILY 45 tablet 0  . metroNIDAZOLE (FLAGYL) 500 MG tablet Take 1 tablet (500 mg total) by mouth 3 (three) times daily. 21 tablet 0  . timolol (TIMOPTIC) 0.5 % ophthalmic solution Place 1 drop into both eyes every morning.    Marland Kitchen aspirin EC 81 MG tablet Take 81 mg by mouth daily. (Patient not taking: Reported on 11/23/2020)    . ciprofloxacin (CIPRO)  500 MG tablet Take 1 tablet (500 mg total) by mouth daily with breakfast. (Patient not taking: Reported on 11/23/2020) 7 tablet 0  . ferrous sulfate 325 (65 FE) MG tablet TAKE 1 TABLET BY MOUTH EVERY OTHER DAY (Patient not taking: Reported on 11/23/2020) 45 tablet 2   No current facility-administered medications for this visit.     PHYSICAL EXAMINATION: ECOG PERFORMANCE STATUS: 1 - Symptomatic but completely ambulatory Vitals:   11/23/20 0946  BP: 122/60  Pulse: 60  Resp: 16  Temp: 97.8 F (36.6 C)  SpO2: 98%   Filed Weights   11/23/20 0946  Weight: 121 lb 1.6 oz (54.9 kg)    Physical Exam Constitutional:      General: She is not in acute distress.    Comments: Patient walks with a walker  HENT:     Head: Normocephalic and atraumatic.  Eyes:     General: No scleral icterus. Cardiovascular:     Rate and Rhythm: Normal rate and regular rhythm.     Heart sounds: Normal heart sounds.  Pulmonary:     Effort: Pulmonary  effort is normal. No respiratory distress.     Breath sounds: No wheezing.  Abdominal:     General: Bowel sounds are normal. There is no distension.     Palpations: Abdomen is soft.  Musculoskeletal:        General: No deformity. Normal range of motion.     Cervical back: Normal range of motion and neck supple.  Skin:    General: Skin is warm and dry.     Findings: No erythema or rash.  Neurological:     Mental Status: She is alert and oriented to person, place, and time. Mental status is at baseline.     Cranial Nerves: No cranial nerve deficit.     Coordination: Coordination normal.  Psychiatric:        Mood and Affect: Mood normal.      LABORATORY DATA:  I have reviewed the data as listed Lab Results  Component Value Date   WBC 5.4 11/23/2020   HGB 9.5 (L) 11/23/2020   HCT 30.0 (L) 11/23/2020   MCV 80.6 11/23/2020   PLT 91 (L) 11/23/2020   Recent Labs    12/09/19 1146 07/28/20 1122 10/18/20 1933 10/20/20 0443 10/21/20 0525 10/22/20 0604 10/28/20 1411  NA 144 137   < > 140 140 142 139  K 4.4 4.6   < > 3.5 3.4* 4.2 4.2  CL 108* 101   < > 112* 111 113* 106  CO2 19* 20   < > 18* 22 20* 15*  GLUCOSE 141* 112*   < > 102* 91 91 167*  BUN 46* 46*   < > 55* 47* 36* 27  CREATININE 1.34* 1.68*   < > 1.90* 1.83* 1.51* 1.69*  CALCIUM 8.7 9.1   < > 7.6* 8.1* 8.4* 8.4*  GFRNONAA 35* 26*   < > 24* 26* 32*  --   GFRAA 40* 30*  --   --   --   --   --   PROT 8.2  --    < > 6.5 6.4* 6.7 8.2  ALBUMIN 4.3 4.4   < > 2.7* 2.7* 2.6* 3.9  AST 14  --    < > 273* 145* 88* 33  ALT 5  --    < > 241* 167* 123* 40*  ALKPHOS 109  --    < > 374* 298* 296* 393*  BILITOT 0.5  --    < >  3.5* 4.4* 4.4* 1.1   < > = values in this interval not displayed.   Iron/TIBC/Ferritin/ %Sat    Component Value Date/Time   IRON 77 11/23/2020 1015   TIBC 274 11/23/2020 1015   FERRITIN 402 (H) 11/23/2020 1015   IRONPCTSAT 28 11/23/2020 1015        ASSESSMENT & PLAN:  1. Anemia of chronic renal  failure, stage 3 (moderate), unspecified whether stage 3a or 3b CKD (HCC)   2. Elevated LFTs    Anemia: multifactorial with possible causes including chronic blood loss, hyper/hypothyroidism, nutritional deficiency, infection/chronic inflammation, hemolysis, underlying bone marrow disorders. Will check CBC w differential, retic panel, iron/TIBC, ferritin,LDH, monoclonal gammopathy evaluation.    Elevated alkaline phosphatase, check GGT. Likely due to recent cholangitis.   Patient denies that she has legal guardian.  I also called and talked to her granddaughter Gabriela [name] who is not aware that patient has legal guardian.  Discussed with above plan with her and her both are in agreement to proceed with blood work  Orders Placed This Encounter  Procedures  . Iron and TIBC    Standing Status:   Future    Number of Occurrences:   1    Standing Expiration Date:   11/23/2021  . Ferritin    Standing Status:   Future    Number of Occurrences:   1    Standing Expiration Date:   05/25/2021  . CBC with Differential/Platelet    Standing Status:   Future    Number of Occurrences:   1    Standing Expiration Date:   11/23/2021  . Technologist smear review    Standing Status:   Future    Number of Occurrences:   1    Standing Expiration Date:   11/23/2021  . Multiple Myeloma Panel (SPEP&IFE w/QIG)    Standing Status:   Future    Number of Occurrences:   1    Standing Expiration Date:   11/23/2021  . Kappa/lambda light chains    Standing Status:   Future    Number of Occurrences:   1    Standing Expiration Date:   11/23/2021  . Lactate dehydrogenase    Standing Status:   Future    Number of Occurrences:   1    Standing Expiration Date:   11/23/2021  . Gamma GT    Standing Status:   Future    Number of Occurrences:   1    Standing Expiration Date:   11/23/2021  . Retic Panel    Standing Status:   Future    Number of Occurrences:   1    Standing Expiration Date:   11/23/2021    All questions were  answered. The patient knows to call the clinic with any problems questions or concerns. Cc. Geradine Girt, DO  Return of visit: 2 weeks Thank you for this kind referral and the opportunity to participate in the care of this patient. A copy of today's note is routed to referring provider    Earlie Server, MD, PhD 11/23/2020

## 2020-11-23 NOTE — Progress Notes (Signed)
Due to low anemia, had transfusion and numbers did not come back to normal. Pts granddaughter is concerned due to pts daughter recently passing due to anemic issues.

## 2020-11-24 LAB — KAPPA/LAMBDA LIGHT CHAINS
Kappa free light chain: 171.3 mg/L — ABNORMAL HIGH (ref 3.3–19.4)
Kappa, lambda light chain ratio: 1.68 — ABNORMAL HIGH (ref 0.26–1.65)
Lambda free light chains: 101.9 mg/L — ABNORMAL HIGH (ref 5.7–26.3)

## 2020-11-24 LAB — GAMMA GT: GGT: 101 U/L — ABNORMAL HIGH (ref 7–50)

## 2020-11-25 DIAGNOSIS — Z48815 Encounter for surgical aftercare following surgery on the digestive system: Secondary | ICD-10-CM | POA: Diagnosis not present

## 2020-11-25 DIAGNOSIS — K8050 Calculus of bile duct without cholangitis or cholecystitis without obstruction: Secondary | ICD-10-CM | POA: Diagnosis not present

## 2020-11-25 DIAGNOSIS — N183 Chronic kidney disease, stage 3 unspecified: Secondary | ICD-10-CM | POA: Diagnosis not present

## 2020-11-25 DIAGNOSIS — R932 Abnormal findings on diagnostic imaging of liver and biliary tract: Secondary | ICD-10-CM | POA: Diagnosis not present

## 2020-11-25 DIAGNOSIS — I129 Hypertensive chronic kidney disease with stage 1 through stage 4 chronic kidney disease, or unspecified chronic kidney disease: Secondary | ICD-10-CM | POA: Diagnosis not present

## 2020-11-25 DIAGNOSIS — Z794 Long term (current) use of insulin: Secondary | ICD-10-CM | POA: Diagnosis not present

## 2020-11-25 DIAGNOSIS — M6281 Muscle weakness (generalized): Secondary | ICD-10-CM | POA: Diagnosis not present

## 2020-11-25 DIAGNOSIS — E1122 Type 2 diabetes mellitus with diabetic chronic kidney disease: Secondary | ICD-10-CM | POA: Diagnosis not present

## 2020-11-25 DIAGNOSIS — K838 Other specified diseases of biliary tract: Secondary | ICD-10-CM | POA: Diagnosis not present

## 2020-11-25 DIAGNOSIS — I2510 Atherosclerotic heart disease of native coronary artery without angina pectoris: Secondary | ICD-10-CM | POA: Diagnosis not present

## 2020-11-25 LAB — MULTIPLE MYELOMA PANEL, SERUM
Albumin SerPl Elph-Mcnc: 3.9 g/dL (ref 2.9–4.4)
Albumin/Glob SerPl: 0.9 (ref 0.7–1.7)
Alpha 1: 0.2 g/dL (ref 0.0–0.4)
Alpha2 Glob SerPl Elph-Mcnc: 0.8 g/dL (ref 0.4–1.0)
B-Globulin SerPl Elph-Mcnc: 1.3 g/dL (ref 0.7–1.3)
Gamma Glob SerPl Elph-Mcnc: 2.2 g/dL — ABNORMAL HIGH (ref 0.4–1.8)
Globulin, Total: 4.5 g/dL — ABNORMAL HIGH (ref 2.2–3.9)
IgA: 651 mg/dL — ABNORMAL HIGH (ref 64–422)
IgG (Immunoglobin G), Serum: 2399 mg/dL — ABNORMAL HIGH (ref 586–1602)
IgM (Immunoglobulin M), Srm: 103 mg/dL (ref 26–217)
M Protein SerPl Elph-Mcnc: 0.4 g/dL — ABNORMAL HIGH
Total Protein ELP: 8.4 g/dL (ref 6.0–8.5)

## 2020-11-26 ENCOUNTER — Ambulatory Visit: Payer: Self-pay | Admitting: Family Medicine

## 2020-11-30 DIAGNOSIS — Z794 Long term (current) use of insulin: Secondary | ICD-10-CM | POA: Diagnosis not present

## 2020-11-30 DIAGNOSIS — M6281 Muscle weakness (generalized): Secondary | ICD-10-CM | POA: Diagnosis not present

## 2020-11-30 DIAGNOSIS — Z48815 Encounter for surgical aftercare following surgery on the digestive system: Secondary | ICD-10-CM | POA: Diagnosis not present

## 2020-11-30 DIAGNOSIS — R932 Abnormal findings on diagnostic imaging of liver and biliary tract: Secondary | ICD-10-CM | POA: Diagnosis not present

## 2020-11-30 DIAGNOSIS — N183 Chronic kidney disease, stage 3 unspecified: Secondary | ICD-10-CM | POA: Diagnosis not present

## 2020-11-30 DIAGNOSIS — K8050 Calculus of bile duct without cholangitis or cholecystitis without obstruction: Secondary | ICD-10-CM | POA: Diagnosis not present

## 2020-11-30 DIAGNOSIS — K838 Other specified diseases of biliary tract: Secondary | ICD-10-CM | POA: Diagnosis not present

## 2020-11-30 DIAGNOSIS — I2510 Atherosclerotic heart disease of native coronary artery without angina pectoris: Secondary | ICD-10-CM | POA: Diagnosis not present

## 2020-11-30 DIAGNOSIS — I129 Hypertensive chronic kidney disease with stage 1 through stage 4 chronic kidney disease, or unspecified chronic kidney disease: Secondary | ICD-10-CM | POA: Diagnosis not present

## 2020-11-30 DIAGNOSIS — E1122 Type 2 diabetes mellitus with diabetic chronic kidney disease: Secondary | ICD-10-CM | POA: Diagnosis not present

## 2020-12-02 DIAGNOSIS — I2510 Atherosclerotic heart disease of native coronary artery without angina pectoris: Secondary | ICD-10-CM | POA: Diagnosis not present

## 2020-12-02 DIAGNOSIS — M6281 Muscle weakness (generalized): Secondary | ICD-10-CM | POA: Diagnosis not present

## 2020-12-02 DIAGNOSIS — I129 Hypertensive chronic kidney disease with stage 1 through stage 4 chronic kidney disease, or unspecified chronic kidney disease: Secondary | ICD-10-CM | POA: Diagnosis not present

## 2020-12-02 DIAGNOSIS — E1122 Type 2 diabetes mellitus with diabetic chronic kidney disease: Secondary | ICD-10-CM | POA: Diagnosis not present

## 2020-12-02 DIAGNOSIS — Z48815 Encounter for surgical aftercare following surgery on the digestive system: Secondary | ICD-10-CM | POA: Diagnosis not present

## 2020-12-02 DIAGNOSIS — K838 Other specified diseases of biliary tract: Secondary | ICD-10-CM | POA: Diagnosis not present

## 2020-12-02 DIAGNOSIS — Z794 Long term (current) use of insulin: Secondary | ICD-10-CM | POA: Diagnosis not present

## 2020-12-02 DIAGNOSIS — K8050 Calculus of bile duct without cholangitis or cholecystitis without obstruction: Secondary | ICD-10-CM | POA: Diagnosis not present

## 2020-12-02 DIAGNOSIS — N183 Chronic kidney disease, stage 3 unspecified: Secondary | ICD-10-CM | POA: Diagnosis not present

## 2020-12-02 DIAGNOSIS — R932 Abnormal findings on diagnostic imaging of liver and biliary tract: Secondary | ICD-10-CM | POA: Diagnosis not present

## 2020-12-08 ENCOUNTER — Other Ambulatory Visit: Payer: Self-pay

## 2020-12-08 ENCOUNTER — Encounter: Payer: Self-pay | Admitting: Oncology

## 2020-12-08 ENCOUNTER — Telehealth: Payer: Self-pay

## 2020-12-08 ENCOUNTER — Encounter: Payer: Self-pay | Admitting: Medical Oncology

## 2020-12-08 ENCOUNTER — Emergency Department
Admission: EM | Admit: 2020-12-08 | Discharge: 2020-12-08 | Disposition: A | Discharge status: Home / Self Care | Payer: Medicare Other | Attending: Student in an Organized Health Care Education/Training Program | Admitting: Student in an Organized Health Care Education/Training Program

## 2020-12-08 ENCOUNTER — Inpatient Hospital Stay (HOSPITAL_BASED_OUTPATIENT_CLINIC_OR_DEPARTMENT_OTHER): Payer: Medicare Other | Admitting: Oncology

## 2020-12-08 ENCOUNTER — Emergency Department: Payer: Medicare Other

## 2020-12-08 VITALS — BP 171/66 | HR 62 | Temp 97.8°F | Resp 18 | Wt 123.3 lb

## 2020-12-08 DIAGNOSIS — S0101XA Laceration without foreign body of scalp, initial encounter: Secondary | ICD-10-CM | POA: Diagnosis not present

## 2020-12-08 DIAGNOSIS — S199XXA Unspecified injury of neck, initial encounter: Secondary | ICD-10-CM | POA: Diagnosis not present

## 2020-12-08 DIAGNOSIS — D631 Anemia in chronic kidney disease: Secondary | ICD-10-CM

## 2020-12-08 DIAGNOSIS — N183 Chronic kidney disease, stage 3 unspecified: Secondary | ICD-10-CM | POA: Diagnosis not present

## 2020-12-08 DIAGNOSIS — R7989 Other specified abnormal findings of blood chemistry: Secondary | ICD-10-CM | POA: Diagnosis not present

## 2020-12-08 DIAGNOSIS — D696 Thrombocytopenia, unspecified: Secondary | ICD-10-CM

## 2020-12-08 DIAGNOSIS — I10 Essential (primary) hypertension: Secondary | ICD-10-CM | POA: Insufficient documentation

## 2020-12-08 DIAGNOSIS — W19XXXA Unspecified fall, initial encounter: Secondary | ICD-10-CM

## 2020-12-08 DIAGNOSIS — Z79899 Other long term (current) drug therapy: Secondary | ICD-10-CM | POA: Diagnosis not present

## 2020-12-08 DIAGNOSIS — E1169 Type 2 diabetes mellitus with other specified complication: Secondary | ICD-10-CM | POA: Diagnosis not present

## 2020-12-08 DIAGNOSIS — E785 Hyperlipidemia, unspecified: Secondary | ICD-10-CM | POA: Insufficient documentation

## 2020-12-08 DIAGNOSIS — Z794 Long term (current) use of insulin: Secondary | ICD-10-CM | POA: Insufficient documentation

## 2020-12-08 DIAGNOSIS — M4319 Spondylolisthesis, multiple sites in spine: Secondary | ICD-10-CM | POA: Diagnosis not present

## 2020-12-08 DIAGNOSIS — D472 Monoclonal gammopathy: Secondary | ICD-10-CM

## 2020-12-08 DIAGNOSIS — S0003XA Contusion of scalp, initial encounter: Secondary | ICD-10-CM

## 2020-12-08 DIAGNOSIS — Z96641 Presence of right artificial hip joint: Secondary | ICD-10-CM | POA: Diagnosis not present

## 2020-12-08 DIAGNOSIS — Z7982 Long term (current) use of aspirin: Secondary | ICD-10-CM | POA: Insufficient documentation

## 2020-12-08 DIAGNOSIS — M7989 Other specified soft tissue disorders: Secondary | ICD-10-CM | POA: Diagnosis not present

## 2020-12-08 DIAGNOSIS — G319 Degenerative disease of nervous system, unspecified: Secondary | ICD-10-CM | POA: Diagnosis not present

## 2020-12-08 DIAGNOSIS — E042 Nontoxic multinodular goiter: Secondary | ICD-10-CM | POA: Diagnosis not present

## 2020-12-08 DIAGNOSIS — I6782 Cerebral ischemia: Secondary | ICD-10-CM | POA: Diagnosis not present

## 2020-12-08 DIAGNOSIS — S0990XA Unspecified injury of head, initial encounter: Secondary | ICD-10-CM | POA: Diagnosis not present

## 2020-12-08 LAB — CBC
HCT: 29.1 % — ABNORMAL LOW (ref 36.0–46.0)
Hemoglobin: 9.5 g/dL — ABNORMAL LOW (ref 12.0–15.0)
MCH: 26 pg (ref 26.0–34.0)
MCHC: 32.6 g/dL (ref 30.0–36.0)
MCV: 79.5 fL — ABNORMAL LOW (ref 80.0–100.0)
Platelets: 86 10*3/uL — ABNORMAL LOW (ref 150–400)
RBC: 3.66 MIL/uL — ABNORMAL LOW (ref 3.87–5.11)
RDW: 17.5 % — ABNORMAL HIGH (ref 11.5–15.5)
WBC: 6.8 10*3/uL (ref 4.0–10.5)
nRBC: 0 % (ref 0.0–0.2)

## 2020-12-08 LAB — COMPREHENSIVE METABOLIC PANEL
ALT: 10 U/L (ref 0–44)
AST: 22 U/L (ref 15–41)
Albumin: 4 g/dL (ref 3.5–5.0)
Alkaline Phosphatase: 136 U/L — ABNORMAL HIGH (ref 38–126)
Anion gap: 11 (ref 5–15)
BUN: 44 mg/dL — ABNORMAL HIGH (ref 8–23)
CO2: 22 mmol/L (ref 22–32)
Calcium: 8.7 mg/dL — ABNORMAL LOW (ref 8.9–10.3)
Chloride: 100 mmol/L (ref 98–111)
Creatinine, Ser: 1.33 mg/dL — ABNORMAL HIGH (ref 0.44–1.00)
GFR, Estimated: 38 mL/min — ABNORMAL LOW (ref >60)
Glucose, Bld: 149 mg/dL — ABNORMAL HIGH (ref 70–99)
Potassium: 3.9 mmol/L (ref 3.5–5.1)
Sodium: 133 mmol/L — ABNORMAL LOW (ref 135–145)
Total Bilirubin: 1 mg/dL (ref 0.3–1.2)
Total Protein: 9 g/dL — ABNORMAL HIGH (ref 6.5–8.1)

## 2020-12-08 LAB — LIPASE, BLOOD: Lipase: 85 U/L — ABNORMAL HIGH (ref 11–51)

## 2020-12-08 MED ORDER — LIDOCAINE-EPINEPHRINE-TETRACAINE (LET) SOLUTION
3.0000 mL | Freq: Once | NASAL | Status: AC
  Administered 2020-12-08: 3 mL via TOPICAL
  Filled 2020-12-08 (×2): qty 3

## 2020-12-08 MED ORDER — ONDANSETRON 4 MG PO TBDP
4.0000 mg | ORAL_TABLET | Freq: Once | ORAL | Status: AC
  Administered 2020-12-08: 4 mg via ORAL
  Filled 2020-12-08: qty 1

## 2020-12-08 MED ORDER — BACITRACIN-NEOMYCIN-POLYMYXIN 400-5-5000 EX OINT
TOPICAL_OINTMENT | Freq: Once | CUTANEOUS | Status: AC
  Filled 2020-12-08: qty 1

## 2020-12-08 MED ORDER — ACETAMINOPHEN 325 MG PO TABS
650.0000 mg | ORAL_TABLET | Freq: Once | ORAL | Status: AC
  Administered 2020-12-08: 650 mg via ORAL
  Filled 2020-12-08: qty 2

## 2020-12-08 MED ORDER — ONDANSETRON 4 MG PO TBDP: 4.0000 mg | ORAL_TABLET | Freq: Three times a day (TID) | ORAL | 0 refills | Status: DC | PRN

## 2020-12-08 NOTE — Progress Notes (Signed)
Hematology/Oncology Consult note [facility] Telephone:([phone] [phone]   Patient Care Team: Jerrol Banana., MD as PCP - General (Family Medicine) Corey Skains, MD as Referring Physician (Internal Medicine) Lorelee Cover., MD (Ophthalmology)  REFERRING PROVIDER: Jerrol Banana.,*  CHIEF COMPLAINTS/REASON FOR VISIT:  Evaluation of anemia  HISTORY OF PRESENTING ILLNESS:  Gabriela [name] is a  87 y.o.  female with PMH listed below who was referred to me for evaluation of anemia Reviewed patient's recent labs that was done.  Labs revealed anemia with hemoglobin of 9, mcv 80.  .   Reviewed patient's previous labs ordered by primary care physician's office, anemia is chronic onset , duration is since at least 2014 with baseline 8-9s.  Patient was admitted from 10/18/20-10/22/20 due to cholangitis/choledocholithiasis/gallstone pancreatitis/acute anemia Patient is status post ERCP with a stent to the common bile duct.  Patient was treated with antibiotics. Her hemoglobin dropped to 6.8 and was transfused 1 unit of PRBC.  Patient was referred to establish care with hematology for further evaluation of anemia and management. Patient's chart says patient has a legal guardian.  Patient was accompanied by her granddaughter Gabriela [name] who is not able to come to the examination room because she brought her kit today and the cancer center policy does not allow children in the cancer center.Gabriela [name] has to stay downstairs with her gait.  Associated signs and symptoms: Patient reports fatigue. deneis SOB with exertion.  Denies weight loss, easy bruising, hematochezia, hemoptysis, hematuria.  Patient denies any pain, fever or chills.   INTERVAL HISTORY Gabriela [name] is a 87 y.o. female who has above history reviewed by me today presents for follow up visit for anemia Problems and complaints are listed below: Patient was accompanied by her  granddaughter Gabriela [name] to discuss lab results. Chronic fatigue at baseline.  Review of Systems  Constitutional: Positive for fatigue. Negative for appetite change, chills and fever.  HENT:   Negative for hearing loss and voice change.   Eyes: Negative for eye problems.  Respiratory: Negative for chest tightness and cough.   Cardiovascular: Negative for chest pain.  Gastrointestinal: Negative for abdominal distention, abdominal pain and blood in stool.  Endocrine: Negative for hot flashes.  Genitourinary: Negative for difficulty urinating and frequency.   Musculoskeletal: Negative for arthralgias.  Skin: Negative for itching and rash.  Neurological: Negative for extremity weakness.  Hematological: Negative for adenopathy.  Psychiatric/Behavioral: Negative for confusion.     MEDICAL HISTORY:  Past Medical History:  Diagnosis Date  . Arthritis   . Diabetes mellitus ([place])   . Glaucoma   . Hyperlipidemia   . Hypertension   . Shortness of breath    with exertion    SURGICAL HISTORY: Past Surgical History:  Procedure Laterality Date  . CARDIAC CATHETERIZATION  04/22/13  . CHOLECYSTECTOMY    . CORONARY ARTERY BYPASS GRAFT N/A 05/31/2013   Procedure: Coronary Artery Bypass Grafting Times Three Using Left Internal Mammary Artery and Right Saphenous Leg Vein Harvested Endoscopically;  Surgeon: Ivin Poot, MD;  Location: [facility];  Service: Open Heart Surgery;  Laterality: N/A;  . ERCP N/A 10/20/2020   Procedure: ENDOSCOPIC RETROGRADE CHOLANGIOPANCREATOGRAPHY (ERCP);  Surgeon: Lucilla Lame, MD;  Location: [facility] ENDOSCOPY;  Service: Endoscopy;  Laterality: N/A;  . EYE SURGERY    . HIP SURGERY     right  . INTRAOPERATIVE TRANSESOPHAGEAL ECHOCARDIOGRAM N/A 05/31/2013   Procedure: INTRAOPERATIVE TRANSESOPHAGEAL ECHOCARDIOGRAM;  Surgeon: Ivin Poot, MD;  Location: [facility] OR;  Service: Open Heart Surgery;  Laterality: N/A;  . JOINT REPLACEMENT    . MITRAL VALVE REPAIR N/A 05/31/2013    Procedure: MITRAL VALVE REPAIR (MVR);  Surgeon: Ivin Poot, MD;  Location: [facility];  Service: Open Heart Surgery;  Laterality: N/A;  . TONSILLECTOMY      SOCIAL HISTORY: Social History   Socioeconomic History  . Marital status: Widowed    Spouse name: Not on file  . Number of children: 7  . Years of education: Not on file  . Highest education level: 11th grade  Occupational History  . Occupation: retired  Tobacco Use  . Smoking status: Never Smoker  . Smokeless tobacco: Never Used  Vaping Use  . Vaping Use: Never used  Substance and Sexual Activity  . Alcohol use: No  . Drug use: No  . Sexual activity: Never  Other Topics Concern  . Not on file  Social History Narrative  . Not on file   Social Determinants of Health   Financial Resource Strain: Low Risk   . Difficulty of Paying Living Expenses: Not hard at all  Food Insecurity: No Food Insecurity  . Worried About Charity fundraiser in the Last Year: Never true  . Ran Out of Food in the Last Year: Never true  Transportation Needs: No Transportation Needs  . Lack of Transportation (Medical): No  . Lack of Transportation (Non-Medical): No  Physical Activity: Inactive  . Days of Exercise per Week: 0 days  . Minutes of Exercise per Session: 0 min  Stress: No Stress Concern Present  . Feeling of Stress : Not at all  Social Connections: Moderately Isolated  . Frequency of Communication with Friends and Family: More than three times a week  . Frequency of Social Gatherings with Friends and Family: More than three times a week  . Attends Religious Services: More than 4 times per year  . Active Member of Clubs or Organizations: No  . Attends Archivist Meetings: Never  . Marital Status: Widowed  Intimate Partner Violence: Not At Risk  . Fear of Current or Ex-Partner: No  . Emotionally Abused: No  . Physically Abused: No  . Sexually Abused: No    FAMILY HISTORY: Family History  Problem Relation Age of  Onset  . Diabetes Sister   . Hypertension Mother     ALLERGIES:  is allergic to sulfa antibiotics.  MEDICATIONS:  No current facility-administered medications for this visit.   Current Outpatient Medications  Medication Sig Dispense Refill  . ACCU-CHEK AVIVA PLUS test strip USE TO TEST SUGAR TWICE A DAY 100 strip 1  . ACCU-CHEK FASTCLIX LANCETS MISC To check blood sugars once a day. DX E11.9 102 each 12  . Blood Glucose Monitoring Suppl (ACCU-CHEK AVIVA PLUS) w/Device KIT 1 each by Does not apply route daily. Dx E11.9 1 kit 0  . furosemide (LASIX) 20 MG tablet Take 1 tablet (20 mg total) by mouth daily.    . hydrochlorothiazide (HYDRODIURIL) 25 MG tablet Take 1 tablet (25 mg total) by mouth daily. 90 tablet 1  . Insulin Pen Needle (B-D UF III MINI PEN NEEDLES) 31G X 5 MM MISC USE WITH INSULIN DOSES 100 each 5  . LEVEMIR FLEXTOUCH 100 UNIT/ML FlexPen INJECT 15 UNITS INTO THE SKIN DAILY. IN THE AFTERNOON 15 mL 5  . metoprolol tartrate (LOPRESSOR) 25 MG tablet TAKE 1/2 TABLET BY MOUTH DAILY 45 tablet 0  . timolol (TIMOPTIC) 0.5 % ophthalmic solution  Place 1 drop into both eyes every morning.    Marland Kitchen aspirin EC 81 MG tablet Take 81 mg by mouth daily. (Patient not taking: No sig reported)    . ferrous sulfate 325 (65 FE) MG tablet TAKE 1 TABLET BY MOUTH EVERY OTHER DAY (Patient not taking: No sig reported) 45 tablet 2   Facility-Administered Medications Ordered in Other Visits  Medication Dose Route Frequency Provider Last Rate Last Admin  . acetaminophen (TYLENOL) tablet 650 mg  650 mg Oral Once Menshew, Jenise V Bacon, PA-C      . lidocaine-EPINEPHrine-tetracaine (LET) solution  3 mL Topical Once Menshew, Jenise V Bacon, PA-C         PHYSICAL EXAMINATION: ECOG PERFORMANCE STATUS: 2 - Symptomatic, <50% confined to bed Vitals:   12/08/20 1005  BP: (!) 171/66  Pulse: 62  Resp: 18  Temp: 97.8 F (36.6 C)   Filed Weights   12/08/20 1005  Weight: 123 lb 4.8 oz (55.9 kg)    Physical  Exam Constitutional:      General: She is not in acute distress.    Comments: Patient sits in a wheelchair today.  HENT:     Head: Normocephalic and atraumatic.  Eyes:     General: No scleral icterus. Cardiovascular:     Rate and Rhythm: Normal rate and regular rhythm.     Heart sounds: Normal heart sounds.  Pulmonary:     Effort: Pulmonary effort is normal. No respiratory distress.     Breath sounds: No wheezing.  Abdominal:     General: Bowel sounds are normal. There is no distension.     Palpations: Abdomen is soft.  Musculoskeletal:        General: No deformity. Normal range of motion.     Cervical back: Normal range of motion and neck supple.  Skin:    General: Skin is warm and dry.     Findings: No erythema or rash.  Neurological:     Mental Status: She is alert and oriented to person, place, and time. Mental status is at baseline.     Cranial Nerves: No cranial nerve deficit.     Coordination: Coordination normal.  Psychiatric:        Mood and Affect: Mood normal.      LABORATORY DATA:  I have reviewed the data as listed Lab Results  Component Value Date   WBC 5.4 11/23/2020   HGB 9.5 (L) 11/23/2020   HCT 30.0 (L) 11/23/2020   MCV 80.6 11/23/2020   PLT 91 (L) 11/23/2020   Recent Labs    12/09/19 1146 07/28/20 1122 10/18/20 1933 10/20/20 0443 10/21/20 0525 10/22/20 0604 10/28/20 1411  NA 144 137   < > 140 140 142 139  K 4.4 4.6   < > 3.5 3.4* 4.2 4.2  CL 108* 101   < > 112* 111 113* 106  CO2 19* 20   < > 18* 22 20* 15*  GLUCOSE 141* 112*   < > 102* 91 91 167*  BUN 46* 46*   < > 55* 47* 36* 27  CREATININE 1.34* 1.68*   < > 1.90* 1.83* 1.51* 1.69*  CALCIUM 8.7 9.1   < > 7.6* 8.1* 8.4* 8.4*  GFRNONAA 35* 26*   < > 24* 26* 32*  --   GFRAA 40* 30*  --   --   --   --   --   PROT 8.2  --    < > 6.5 6.4* 6.7 8.2  ALBUMIN 4.3 4.4   < > 2.7* 2.7* 2.6* 3.9  AST 14  --    < > 273* 145* 88* 33  ALT 5  --    < > 241* 167* 123* 40*  ALKPHOS 109  --    < > 374*  298* 296* 393*  BILITOT 0.5  --    < > 3.5* 4.4* 4.4* 1.1   < > = values in this interval not displayed.   Iron/TIBC/Ferritin/ %Sat    Component Value Date/Time   IRON 77 11/23/2020 1015   TIBC 274 11/23/2020 1015   FERRITIN 402 (H) 11/23/2020 1015   IRONPCTSAT 28 11/23/2020 1015        ASSESSMENT & PLAN:  1. Anemia of chronic renal failure, stage 3 (moderate), unspecified whether stage 3a or 3b CKD (HCC)   2. Elevated LFTs   3. MGUS (monoclonal gammopathy of unknown significance)   4. Thrombocytopenia ([place])    #Anemia, Labs reviewed and discussed with patient.  Multifactorial Chronic kidney disease most likely contribute to the anemia.  Ferritin is adequate. Discussed about the rationale and potential side effects of the erythropoietin replacement therapy. Currently hemoglobin is 9.5 and the patient reports feeling well at baseline.  Given her age and multiple other comorbidities, patient and granddaughter decided not to initiate erythropoietin replacement therapy now and continue to be monitored.  #MGUS-IgG kappa I discussed with patient about the diagnosis of IgG MGUS which is an asymptomatic condition which has a small risk of progression to smoldering multiple myeloma and to symptomatic multiple myeloma. Less frequently, these patients progress to AL amyloidosis, light chain deposition disease, or another lymphoproliferative disorder.  #Thrombocytopenia, etiology unknown.  Normal vitamin B12 and folate level.?  Consumption due to recent infection/inflammation secondary to cholangitis.  Discussed with patient and granddaughter about bone marrow biopsy given that patient has both anemia, and thrombocytopenia and likely a premyeloma condition.  Patient and granddaughter decided to defer it now and plan to focus on cholangitis/choledocholithiasis follow-up.  I will see patient in 3 months and repeat her blood work.  Orders Placed This Encounter  Procedures  . IFE AND PE,  RANDOM URINE    Standing Status:   Future    Standing Expiration Date:   12/08/2021  . CBC with Differential/Platelet    Standing Status:   Future    Standing Expiration Date:   12/08/2021  . Comprehensive metabolic panel    Standing Status:   Future    Standing Expiration Date:   12/08/2021  . Immature Platelet Fraction    Standing Status:   Future    Standing Expiration Date:   12/08/2021  . Folate    Standing Status:   Future    Standing Expiration Date:   12/08/2021  . Lactate dehydrogenase    Standing Status:   Future    Standing Expiration Date:   12/08/2021  . Iron and TIBC    Standing Status:   Future    Standing Expiration Date:   12/08/2021  . Ferritin    Standing Status:   Future    Standing Expiration Date:   12/08/2021  . Retic Panel    Standing Status:   Future    Standing Expiration Date:   12/08/2021    All questions were answered. The patient knows to call the clinic with any problems questions or concerns. Cc. Jerrol Banana.,*  Return of visit: 3 months  Earlie Server, MD, PhD 12/08/2020

## 2020-12-08 NOTE — ED Triage Notes (Signed)
Pt was at a apt for cancer center when her wheel chair hit a bump and caused her to fall. Pt has lac to back of head. This RN arrived to parking lot, pt was placed on back board and stretcher brought to ED. Pt A/O x 4. Denies pain. Denies use of blood thinner.

## 2020-12-08 NOTE — Telephone Encounter (Signed)
Copied from [place] [id number]. Topic: Appointment Scheduling - Scheduling Inquiry for Clinic >> Dec 08, 2020  1:08 PM Leonides Schanz, [place] wrote: Reason for CRM: Pt granddaughter [name] called to schedule appt for removal of stitches within 5-7 days. There were no appts available with Dr. Rosanna Randy within 7 days. [name] stated they were told the stitches could not stay in longer than 7 days so she requests call back. Cb# [phone]

## 2020-12-08 NOTE — ED Notes (Signed)
Pt denies nausea and vomiting 30 min post PO challenge. Pt up and in chair. Pt denies dizziness upon standing.

## 2020-12-08 NOTE — Discharge Instructions (Signed)
Ms. Coppa has a normal exam, head, and neck CT following her fall.  The scalp laceration has been repaired using staples.  Keep the area clean, dry, and covered with a thin veil of antibiotic ointment.  Give Tylenol as needed for pain.  Her CT did reveal an incidental finding of thyroid nodules.  These should be followed up by the PCP with an outpatient ultrasound at some point.

## 2020-12-08 NOTE — ED Notes (Addendum)
ED Provider at bedside. 

## 2020-12-08 NOTE — Telephone Encounter (Signed)
Please advise. Thanks.  

## 2020-12-08 NOTE — ED Provider Notes (Signed)
[facility] Emergency Department Provider Note  ____________________________________________   Event Date/Time   First MD Initiated Contact with Patient 12/08/20 1115     (approximate)  I have reviewed the triage vital signs and the nursing notes.   HISTORY  Chief Complaint Fall  HPI Gabriela [name] is a 87 y.o. female presents to the ED for evaluation of report of fall.  Patient is a cancer center patient, who apparently had a mechanical fall today.  She apparently fell out of her rolling walker, and fell landing backwards on her head.  There was no reported LOC.  Patient presents now with a laceration to the back of the scalp and no other complaints.  She has been alert and oriented since her transfer from the cancer center to our ED.     Past Medical History:  Diagnosis Date  . Arthritis   . Diabetes mellitus ([place])   . Glaucoma   . Hyperlipidemia   . Hypertension   . Shortness of breath    with exertion    Patient Active Problem List   Diagnosis Date Noted  . Choledocholithiasis   . Common bile duct dilatation   . Abnormal findings on imaging of biliary tract   . Elevated LFTs 10/18/2020  . Wound of thigh 03/30/2018  . History of revision of total replacement of right hip joint 01/20/2016  . Abnormal LFTs (liver function tests) 04/17/2015  . Anemia of diabetes 04/17/2015  . Atherosclerosis of coronary artery 04/17/2015  . Diabetic kidney ([place]) 04/17/2015  . Essential (primary) hypertension 04/17/2015  . Glaucoma 04/17/2015  . HLD (hyperlipidemia) 04/17/2015  . Disorder of kidney 04/17/2015  . Arthritis, degenerative 04/17/2015  . Arthritis, hip 04/26/2013  . Left main coronary artery disease 04/26/2013  . Ischemic mitral regurgitation 04/26/2013  . Hypertension   . Diabetes mellitus ([place])   . Hyperlipidemia     Past Surgical History:  Procedure Laterality Date  . CARDIAC CATHETERIZATION  04/22/13  . CHOLECYSTECTOMY    . CORONARY  ARTERY BYPASS GRAFT N/A 05/31/2013   Procedure: Coronary Artery Bypass Grafting Times Three Using Left Internal Mammary Artery and Right Saphenous Leg Vein Harvested Endoscopically;  Surgeon: Ivin Poot, MD;  Location: [place];  Service: Open Heart Surgery;  Laterality: N/A;  . ERCP N/A 10/20/2020   Procedure: ENDOSCOPIC RETROGRADE CHOLANGIOPANCREATOGRAPHY (ERCP);  Surgeon: Lucilla Lame, MD;  Location: [facility] ENDOSCOPY;  Service: Endoscopy;  Laterality: N/A;  . EYE SURGERY    . HIP SURGERY     right  . INTRAOPERATIVE TRANSESOPHAGEAL ECHOCARDIOGRAM N/A 05/31/2013   Procedure: INTRAOPERATIVE TRANSESOPHAGEAL ECHOCARDIOGRAM;  Surgeon: Ivin Poot, MD;  Location: [place];  Service: Open Heart Surgery;  Laterality: N/A;  . JOINT REPLACEMENT    . MITRAL VALVE REPAIR N/A 05/31/2013   Procedure: MITRAL VALVE REPAIR (MVR);  Surgeon: Ivin Poot, MD;  Location: [place];  Service: Open Heart Surgery;  Laterality: N/A;  . TONSILLECTOMY      Prior to Admission medications   Medication Sig Start Date End Date Taking? Authorizing Provider  ondansetron (ZOFRAN ODT) 4 MG disintegrating tablet Take 1 tablet (4 mg total) by mouth every 8 (eight) hours as needed. 12/08/20  Yes Saxon Barich, Dannielle Karvonen, PA-C  ACCU-CHEK AVIVA PLUS test strip USE TO TEST SUGAR TWICE A DAY 12/30/19   Jerrol Banana., MD  ACCU-CHEK FASTCLIX LANCETS MISC To check blood sugars once a day. DX E11.9 08/24/18   Jerrol Banana., MD  aspirin EC 81 MG tablet Take 81 mg by mouth daily. Patient not taking: No sig reported    [provider]  Blood Glucose Monitoring Suppl (ACCU-CHEK AVIVA PLUS) w/Device KIT 1 each by Does not apply route daily. Dx E11.9 03/29/18   Jerrol Banana., MD  ferrous sulfate 325 (65 FE) MG tablet TAKE 1 TABLET BY MOUTH EVERY OTHER DAY Patient not taking: No sig reported 08/03/20   Jerrol Banana., MD  furosemide (LASIX) 20 MG tablet Take 1 tablet (20 mg total) by mouth daily. 10/23/20    Geradine Girt, DO  hydrochlorothiazide (HYDRODIURIL) 25 MG tablet Take 1 tablet (25 mg total) by mouth daily. 10/06/20   Jerrol Banana., MD  Insulin Pen Needle (B-D UF III MINI PEN NEEDLES) 31G X 5 MM MISC USE WITH INSULIN DOSES 08/18/20   Jerrol Banana., MD  LEVEMIR FLEXTOUCH 100 UNIT/ML FlexPen INJECT 15 UNITS INTO THE SKIN DAILY. IN THE AFTERNOON 08/03/20   Jerrol Banana., MD  metoprolol tartrate (LOPRESSOR) 25 MG tablet TAKE 1/2 TABLET BY MOUTH DAILY 08/25/20   Jerrol Banana., MD  timolol (TIMOPTIC) 0.5 % ophthalmic solution Place 1 drop into both eyes every morning. 01/03/19   [provider]    Allergies Sulfa antibiotics  Family History  Problem Relation Age of Onset  . Diabetes Sister   . Hypertension Mother     Social History Social History   Tobacco Use  . Smoking status: Never Smoker  . Smokeless tobacco: Never Used  Vaping Use  . Vaping Use: Never used  Substance Use Topics  . Alcohol use: No  . Drug use: No    Review of Systems  Constitutional: No fever/chills Eyes: No visual changes. ENT: No sore throat. Cardiovascular: Denies chest pain. Respiratory: Denies shortness of breath. Gastrointestinal: No abdominal pain.  No nausea, no vomiting.  No diarrhea.  No constipation. Genitourinary: Negative for dysuria. Musculoskeletal: Negative for back pain. Skin: Negative for rash.  Posterior scalp lac as above. Neurological: Negative for headaches, focal weakness or numbness. ____________________________________________   PHYSICAL EXAM:  VITAL SIGNS: ED Triage Vitals  Enc Vitals Group     BP      Pulse      Resp      Temp      Temp src      SpO2      Weight      Height      Head Circumference      Peak Flow      Pain Score      Pain Loc      Pain Edu?      Excl. in [place]?    Constitutional: Alert and oriented. Well appearing and in no acute distress. GCS =15 Eyes: Conjunctivae are normal. PERRL. EOMI. Head:  Atraumatic, except for a posterior scalp laceration and abrasion.  No active bleeding is appreciated.. Nose: No congestion/rhinnorhea. Mouth/Throat: Mucous membranes are moist.  Oropharynx non-erythematous. Neck: No stridor.  No cervical spine tenderness to palpation. Hematological/Lymphatic/Immunilogical: No cervical lymphadenopathy. Cardiovascular: Normal rate, regular rhythm. Grossly normal heart sounds.  Good peripheral circulation. Respiratory: Normal respiratory effort.  No retractions. Lungs CTAB. Gastrointestinal: Soft and nontender. No distention. No abdominal bruits. No CVA tenderness. Musculoskeletal: No lower extremity tenderness nor edema.  No joint effusions. Neurologic:  Normal speech and language. No gross focal neurologic deficits are appreciated. No gait instability. Skin:  Skin is warm, dry and intact. No  rash noted. Psychiatric: Mood and affect are normal. Speech and behavior are normal.  ____________________________________________   LABS (all labs ordered are listed, but only abnormal results are displayed)  Labs Reviewed  CBC - Abnormal; Notable for the following components:      Result Value   RBC 3.66 (*)    Hemoglobin 9.5 (*)    HCT 29.1 (*)    MCV 79.5 (*)    RDW 17.5 (*)    Platelets 86 (*)    All other components within normal limits  COMPREHENSIVE METABOLIC PANEL - Abnormal; Notable for the following components:   Sodium 133 (*)    Glucose, Bld 149 (*)    BUN 44 (*)    Creatinine, Ser 1.33 (*)    Calcium 8.7 (*)    Total Protein 9.0 (*)    Alkaline Phosphatase 136 (*)    GFR, Estimated 38 (*)    All other components within normal limits  LIPASE, BLOOD - Abnormal; Notable for the following components:   Lipase 85 (*)    All other components within normal limits   ____________________________________________  EKG  ____________________________________________  RADIOLOGY I, Melvenia Needles, personally viewed and evaluated these images  (plain radiographs) as part of my medical decision making, as well as reviewing the written report by the radiologist.  ED MD interpretation:  Agree with report  Official radiology report(s): CT Head Wo Contrast  Result Date: 12/08/2020 CLINICAL DATA:  Head trauma, minor. Additional history provided: Fall, laceration to back of head. EXAM: CT HEAD WITHOUT CONTRAST CT CERVICAL SPINE WITHOUT CONTRAST TECHNIQUE: Multidetector CT imaging of the head and cervical spine was performed following the standard protocol without intravenous contrast. Multiplanar CT image reconstructions of the cervical spine were also generated. COMPARISON:  Radiographs of the cervical spine 05/19/2018. FINDINGS: CT HEAD FINDINGS Brain: Mild cerebral and cerebellar atrophy. Moderate patchy and ill-defined hypoattenuation within the cerebral white matter is nonspecific, but compatible with chronic small vessel ischemic disease. There is no acute intracranial hemorrhage. No demarcated cortical infarct. No extra-axial fluid collection. No evidence of intracranial mass. No midline shift. Vascular: No hyperdense vessel.  Atherosclerotic calcifications Skull: Normal. Negative for fracture or focal lesion. Sinuses/Orbits: Visualized orbits show no acute finding. Mild bilateral ethmoid sinus mucosal thickening at the imaged levels. Other: Parietooccipital scalp soft tissue swelling. CT CERVICAL SPINE FINDINGS Alignment: Straightening of the expected cervical lordosis. 2 mm C2-C3 grade 1 anterolisthesis. Trace C7-T1 and T1-T2 grade 1 anterolisthesis. Skull base and vertebrae: The basion-dental and atlanto-dental intervals are maintained.No evidence of acute fracture to the cervical spine. Soft tissues and spinal canal: No prevertebral fluid or swelling. No visible canal hematoma. Disc levels: Cervical spondylosis with multilevel disc space narrowing, disc bulges, posterior disc osteophytes, endplate spurring, uncovertebral hypertrophy and facet  arthrosis. Disc space narrowing is advanced at C3-C4, C4-C5, C5-C6 and C6-C7. Early fusion across the C4-C5 disc space. Multilevel ventral osteophytes most prominent at C5-C6 and C6-C7. Multilevel spinal canal stenosis. Most notably, a posterior disc osteophyte contributes to suspected moderate spinal canal stenosis at C5-C6. Multilevel bony neural foraminal narrowing. Facet joint ankylosis on the left at C4-C5 and bilaterally at C7-T1. Upper chest: No consolidation within the imaged lung apices. No visible pneumothorax. Other: Bilateral thyroid nodules, the largest arising from the right lobe measuring 2 cm (series 3, image 74). Chronic, healed fracture deformity of the posterior left first rib. IMPRESSION: CT head: 1. No evidence of acute intracranial abnormality. 2. Parietooccipital scalp soft tissue swelling. 3. Moderate  cerebral white matter chronic small vessel ischemic disease. 4. Mild generalized parenchymal atrophy. CT cervical spine: 1. No evidence of acute fracture to the cervical spine. 2. Nonspecific straightening of the expected cervical lordosis. 3. Mild C2-C3, C7-T1 and T1-T2 grade 1 anterolisthesis. 4. Cervical and upper thoracic spondylosis, as described and with levels of degenerative appearing fusion. 5. Multiple thyroid lobe nodules, the largest arising from the right lobe (measuring 2 cm). Given the patient's advanced age, a follow-up thyroid ultrasound may be obtained for further evaluation only as clinically appropriate. Electronically Signed   By: Kellie Simmering DO   On: 12/08/2020 12:45   CT Cervical Spine Wo Contrast  Result Date: 12/08/2020 CLINICAL DATA:  Head trauma, minor. Additional history provided: Fall, laceration to back of head. EXAM: CT HEAD WITHOUT CONTRAST CT CERVICAL SPINE WITHOUT CONTRAST TECHNIQUE: Multidetector CT imaging of the head and cervical spine was performed following the standard protocol without intravenous contrast. Multiplanar CT image reconstructions of the  cervical spine were also generated. COMPARISON:  Radiographs of the cervical spine 05/19/2018. FINDINGS: CT HEAD FINDINGS Brain: Mild cerebral and cerebellar atrophy. Moderate patchy and ill-defined hypoattenuation within the cerebral white matter is nonspecific, but compatible with chronic small vessel ischemic disease. There is no acute intracranial hemorrhage. No demarcated cortical infarct. No extra-axial fluid collection. No evidence of intracranial mass. No midline shift. Vascular: No hyperdense vessel.  Atherosclerotic calcifications Skull: Normal. Negative for fracture or focal lesion. Sinuses/Orbits: Visualized orbits show no acute finding. Mild bilateral ethmoid sinus mucosal thickening at the imaged levels. Other: Parietooccipital scalp soft tissue swelling. CT CERVICAL SPINE FINDINGS Alignment: Straightening of the expected cervical lordosis. 2 mm C2-C3 grade 1 anterolisthesis. Trace C7-T1 and T1-T2 grade 1 anterolisthesis. Skull base and vertebrae: The basion-dental and atlanto-dental intervals are maintained.No evidence of acute fracture to the cervical spine. Soft tissues and spinal canal: No prevertebral fluid or swelling. No visible canal hematoma. Disc levels: Cervical spondylosis with multilevel disc space narrowing, disc bulges, posterior disc osteophytes, endplate spurring, uncovertebral hypertrophy and facet arthrosis. Disc space narrowing is advanced at C3-C4, C4-C5, C5-C6 and C6-C7. Early fusion across the C4-C5 disc space. Multilevel ventral osteophytes most prominent at C5-C6 and C6-C7. Multilevel spinal canal stenosis. Most notably, a posterior disc osteophyte contributes to suspected moderate spinal canal stenosis at C5-C6. Multilevel bony neural foraminal narrowing. Facet joint ankylosis on the left at C4-C5 and bilaterally at C7-T1. Upper chest: No consolidation within the imaged lung apices. No visible pneumothorax. Other: Bilateral thyroid nodules, the largest arising from the right  lobe measuring 2 cm (series 3, image 74). Chronic, healed fracture deformity of the posterior left first rib. IMPRESSION: CT head: 1. No evidence of acute intracranial abnormality. 2. Parietooccipital scalp soft tissue swelling. 3. Moderate cerebral white matter chronic small vessel ischemic disease. 4. Mild generalized parenchymal atrophy. CT cervical spine: 1. No evidence of acute fracture to the cervical spine. 2. Nonspecific straightening of the expected cervical lordosis. 3. Mild C2-C3, C7-T1 and T1-T2 grade 1 anterolisthesis. 4. Cervical and upper thoracic spondylosis, as described and with levels of degenerative appearing fusion. 5. Multiple thyroid lobe nodules, the largest arising from the right lobe (measuring 2 cm). Given the patient's advanced age, a follow-up thyroid ultrasound may be obtained for further evaluation only as clinically appropriate. Electronically Signed   By: Kellie Simmering DO   On: 12/08/2020 12:45    ____________________________________________   PROCEDURES  Procedure(s) performed (including Critical Care):  Marland KitchenMarland KitchenLaceration Repair  Date/Time: 12/08/2020 1:00 PM Performed by:  Rosana Hoes, Amy, [place] Authorized by: Melvenia Needles, PA-C   Consent:    Consent obtained:  Verbal   Consent given by:  Patient   Risks, benefits, and alternatives were discussed: yes     Risks discussed:  Poor wound healing   Alternatives discussed:  No treatment Universal protocol:    Test results available: yes     Site/side marked: yes     Patient identity confirmed:  Verbally with patient Anesthesia:    Anesthesia method:  Topical application   Topical anesthetic:  LET Laceration details:    Location:  Scalp   Scalp location:  Occipital   Length (cm):  2   Depth (mm):  5 Pre-procedure details:    Preparation:  Patient was prepped and draped in usual sterile fashion Exploration:    Contaminated: no   Treatment:    Area cleansed with:  Saline   Amount of cleaning:   Standard   Debridement:  None   Undermining:  None   Scar revision: no   Skin repair:    Repair method:  Staples   Number of staples:  4 Approximation:    Approximation:  Close Repair type:    Repair type:  Simple Post-procedure details:    Dressing:  Non-adherent dressing and bulky dressing   Procedure completion:  Tolerated well, no immediate complications     ____________________________________________   INITIAL IMPRESSION / ASSESSMENT AND PLAN / ED COURSE  As part of my medical decision making, I reviewed the following data within the electronic MEDICAL RECORD NUMBER History obtained from family, Labs reviewed WNL and Notes from prior ED visits    Differential diagnosis includes, but is not limited to, intracranial hemorrhage, previous head trauma, cavernous venous thrombosis, tension headache, temporal arteritis, migraine or migraine equivalent, idiopathic intracranial hypertension, and non-specific headache.  Geriatric patient ED evaluation of a mechanical fall resulting in posterior head contusion and laceration.  Patient was evaluated for her complaints in the ED and has been stable throughout her course.  Her only complaint was some mild tenderness at the scalp.  Patient tolerated a scalp lack repair using staples.  She was cleared with normal head and neck CT images.  Patient was discharged to the care of her granddaughter, when she experienced a episode of emesis after she transferred from the bed to the wheelchair.  She was brought back into the ED and evaluated with labs and observation.  She is again stable at this time without complaints.  Labs are normal and reassuring at this time.  Patient tolerated p.o. challenge and has no subsequent emesis.  She again denies any acute pain.  She is discharged to follow-up with primary provider for ongoing symptoms peer return precautions have been discussed. ____________________________________________   FINAL CLINICAL IMPRESSION(S) /  ED DIAGNOSES  Final diagnoses:  Fall, initial encounter  Contusion of scalp, initial encounter  Laceration of scalp, initial encounter     ED Discharge Orders         Ordered    ondansetron (ZOFRAN ODT) 4 MG disintegrating tablet  Every 8 hours PRN        12/08/20 1707          *Please note:  Gabriela [name] was evaluated in Emergency Department on 12/08/2020 for the symptoms described in the history of present illness. She was evaluated in the context of the global COVID-19 pandemic, which necessitated consideration that the patient might be at risk for infection with the SARS-CoV-2 virus  that causes COVID-19. Institutional protocols and algorithms that pertain to the evaluation of patients at risk for COVID-19 are in a state of rapid change based on information released by regulatory bodies including the CDC and federal and state organizations. These policies and algorithms were followed during the patient's care in the ED.  Some ED evaluations and interventions may be delayed as a result of limited staffing during and the pandemic.*   Note:  This document was prepared using Dragon voice recognition software and may include unintentional dictation errors.    Melvenia Needles, PA-C 12/08/20 2039    Merlyn Lot, MD 12/09/20 [phone]

## 2020-12-08 NOTE — ED Notes (Signed)
Patients wound redressed.

## 2020-12-08 NOTE — ED Notes (Signed)
Patient taken to vehicle in wheelchair. While attempting to get patient into car, patient became nauseous and vomited in the parking lot. Patients daughter expresses concern, so patient is brought back into the ER. Quentin Cornwall, MD made aware. See orders.

## 2020-12-08 NOTE — ED Notes (Signed)
PO challenge started with solids and liquids.

## 2020-12-08 NOTE — ED Notes (Signed)
Patient denies nausea. Pt resting in bed. Call light in reach. Fall precautions in place. Granddaughter at bedside.

## 2020-12-08 NOTE — Progress Notes (Signed)
Pt here for follow up. No new concerns voiced.   

## 2020-12-08 NOTE — ED Notes (Signed)
Patient transported to CT 

## 2020-12-09 NOTE — Telephone Encounter (Signed)
May 2 at 1120 or May 4 at 1 both ok

## 2020-12-09 NOTE — Telephone Encounter (Signed)
Called to schedule appt for 12/14/20 at 11:20. No answer and no vm. Appointment was scheduled.

## 2020-12-11 NOTE — Telephone Encounter (Signed)
Gabriela [name] was advised.

## 2020-12-14 ENCOUNTER — Encounter: Payer: Self-pay | Admitting: Family Medicine

## 2020-12-14 ENCOUNTER — Ambulatory Visit (INDEPENDENT_AMBULATORY_CARE_PROVIDER_SITE_OTHER): Payer: Medicare Other | Admitting: Family Medicine

## 2020-12-14 ENCOUNTER — Other Ambulatory Visit: Payer: Self-pay

## 2020-12-14 VITALS — BP 166/76 | HR 18 | Resp 18 | Wt 126.4 lb

## 2020-12-14 DIAGNOSIS — S0191XA Laceration without foreign body of unspecified part of head, initial encounter: Secondary | ICD-10-CM | POA: Diagnosis not present

## 2020-12-14 NOTE — Progress Notes (Signed)
I,[name],acting as a scribe for Wilhemena Durie, MD.,have documented all relevant documentation on the behalf of Wilhemena Durie, MD,as directed by  Wilhemena Durie, MD while in the presence of Wilhemena Durie, MD.   Established patient visit   [name]   DOB: 1925/12/09   87 y.o. Female  MRN: [id number] Visit Date: 12/14/2020  Today's healthcare provider: Wilhemena Durie, MD   Chief Complaint  Patient presents with  . Hospitalization Follow-up   Subjective    HPI  Patient fell and had 4 staples placed in the back of her head.  She has done well since her fall. No postconcussive symptoms. Follow up ER visit  Patient was seen in ER for fall on 12/08/2020. She was treated for; laceration on back of head from fall. Treatment for this included; 4 Staples on back of head. She reports good compliance with treatment. She reports this condition is Improved.  --------------------------------------------------------------------       Medications: Outpatient Medications Prior to Visit  Medication Sig  . ACCU-CHEK AVIVA PLUS test strip USE TO TEST SUGAR TWICE A DAY  . ACCU-CHEK FASTCLIX LANCETS MISC To check blood sugars once a day. DX E11.9  . aspirin EC 81 MG tablet Take 81 mg by mouth daily.  . Blood Glucose Monitoring Suppl (ACCU-CHEK AVIVA PLUS) w/Device KIT 1 each by Does not apply route daily. Dx E11.9  . furosemide (LASIX) 20 MG tablet Take 1 tablet (20 mg total) by mouth daily.  . hydrochlorothiazide (HYDRODIURIL) 25 MG tablet Take 1 tablet (25 mg total) by mouth daily.  . Insulin Pen Needle (B-D UF III MINI PEN NEEDLES) 31G X 5 MM MISC USE WITH INSULIN DOSES  . LEVEMIR FLEXTOUCH 100 UNIT/ML FlexPen INJECT 15 UNITS INTO THE SKIN DAILY. IN THE AFTERNOON  . metoprolol tartrate (LOPRESSOR) 25 MG tablet TAKE 1/2 TABLET BY MOUTH DAILY  . ondansetron (ZOFRAN ODT) 4 MG disintegrating tablet Take 1 tablet (4 mg total) by mouth every 8  (eight) hours as needed.  . timolol (TIMOPTIC) 0.5 % ophthalmic solution Place 1 drop into both eyes every morning.  . ferrous sulfate 325 (65 FE) MG tablet TAKE 1 TABLET BY MOUTH EVERY OTHER DAY (Patient not taking: No sig reported)   No facility-administered medications prior to visit.    Review of Systems  Constitutional: Negative for appetite change, chills, fatigue and fever.  Respiratory: Negative for chest tightness and shortness of breath.   Cardiovascular: Negative for chest pain and palpitations.  Gastrointestinal: Negative for abdominal pain, nausea and vomiting.  Neurological: Negative for dizziness and weakness.        Objective    BP (!) 166/76 (BP Location: Left Arm, Patient Position: Sitting, Cuff Size: Normal)   Pulse (!) 18   Resp 18   Wt 126 lb 6.4 oz (57.3 kg)   SpO2 96%   BMI 21.70 kg/m  Wt Readings from Last 3 Encounters:  12/14/20 126 lb 6.4 oz (57.3 kg)  12/08/20 121 lb 4.1 oz (55 kg)  12/08/20 123 lb 4.8 oz (55.9 kg)       Physical Exam Vitals reviewed.  Constitutional:      Appearance: Normal appearance.  HENT:     Head: Normocephalic.     Comments: 4 staples were removed from the back of the head with some difficulty as they are scabbed over.  Patient tolerated this well  Cardiovascular:     Rate and Rhythm: Regular rhythm.  Neurological:     General: No focal deficit present.     Mental Status: She is alert and oriented to person, place, and time.  Psychiatric:        Mood and Affect: Mood normal.        Behavior: Behavior normal.        Thought Content: Thought content normal.        Judgment: Judgment normal.       No results found for any visits on 12/14/20.  Assessment & Plan     1. Laceration of head without complication, initial encounter Staples removed and wound is dressed.  Patient appears to be without complication from the head trauma. Follow-up as needed   No follow-ups on file.      I, Wilhemena Durie, MD,  have reviewed all documentation for this visit. The documentation on 12/18/20 for the exam, diagnosis, procedures, and orders are all accurate and complete.    Jerome Otter Cranford Mon, MD  [facility] [phone] (phone) [phone] (fax)  [place]

## 2020-12-21 ENCOUNTER — Telehealth: Payer: Self-pay

## 2020-12-21 ENCOUNTER — Encounter: Payer: Self-pay | Admitting: Gastroenterology

## 2020-12-21 NOTE — Telephone Encounter (Signed)
Patient is having her stint removed on tomo. Hospital told her to call for any special instructions. Please call to advise patient and daughter. Thanks

## 2020-12-22 ENCOUNTER — Ambulatory Visit: Payer: Medicare Other | Admitting: Certified Registered"

## 2020-12-22 ENCOUNTER — Encounter
Admission: RE | Disposition: A | Discharge status: Home / Self Care | Payer: Self-pay | Source: Ambulatory Visit | Attending: Gastroenterology

## 2020-12-22 ENCOUNTER — Ambulatory Visit: Payer: Medicare Other

## 2020-12-22 ENCOUNTER — Ambulatory Visit
Admission: RE | Admit: 2020-12-22 | Discharge: 2020-12-22 | Disposition: A | Discharge status: Home / Self Care | Payer: Medicare Other | Source: Ambulatory Visit | Attending: Gastroenterology | Admitting: Gastroenterology

## 2020-12-22 ENCOUNTER — Encounter: Payer: Self-pay | Admitting: Gastroenterology

## 2020-12-22 DIAGNOSIS — Z4582 Encounter for adjustment or removal of myringotomy device (stent) (tube): Secondary | ICD-10-CM

## 2020-12-22 DIAGNOSIS — Z4659 Encounter for fitting and adjustment of other gastrointestinal appliance and device: Secondary | ICD-10-CM | POA: Insufficient documentation

## 2020-12-22 DIAGNOSIS — Z951 Presence of aortocoronary bypass graft: Secondary | ICD-10-CM | POA: Diagnosis not present

## 2020-12-22 DIAGNOSIS — K838 Other specified diseases of biliary tract: Secondary | ICD-10-CM | POA: Diagnosis not present

## 2020-12-22 DIAGNOSIS — Z7982 Long term (current) use of aspirin: Secondary | ICD-10-CM | POA: Diagnosis not present

## 2020-12-22 DIAGNOSIS — Z882 Allergy status to sulfonamides status: Secondary | ICD-10-CM | POA: Diagnosis not present

## 2020-12-22 DIAGNOSIS — Z79899 Other long term (current) drug therapy: Secondary | ICD-10-CM | POA: Insufficient documentation

## 2020-12-22 DIAGNOSIS — K8050 Calculus of bile duct without cholangitis or cholecystitis without obstruction: Secondary | ICD-10-CM | POA: Diagnosis not present

## 2020-12-22 DIAGNOSIS — E785 Hyperlipidemia, unspecified: Secondary | ICD-10-CM | POA: Diagnosis not present

## 2020-12-22 DIAGNOSIS — Z794 Long term (current) use of insulin: Secondary | ICD-10-CM | POA: Diagnosis not present

## 2020-12-22 HISTORY — PX: ERCP: SHX5425

## 2020-12-22 LAB — GLUCOSE, CAPILLARY: Glucose-Capillary: 98 mg/dL (ref 70–99)

## 2020-12-22 SURGERY — ERCP, WITH INTERVENTION IF INDICATED
Anesthesia: General

## 2020-12-22 MED ORDER — INDOMETHACIN 50 MG RE SUPP
RECTAL | Status: AC
  Filled 2020-12-22: qty 2

## 2020-12-22 MED ORDER — LIDOCAINE HCL (CARDIAC) PF 100 MG/5ML IV SOSY
PREFILLED_SYRINGE | INTRAVENOUS | Status: DC | PRN
  Administered 2020-12-22: 40 mg via INTRAVENOUS

## 2020-12-22 MED ORDER — FENTANYL CITRATE (PF) 100 MCG/2ML IJ SOLN
INTRAMUSCULAR | Status: AC
  Filled 2020-12-22: qty 2

## 2020-12-22 MED ORDER — PROPOFOL 10 MG/ML IV BOLUS
INTRAVENOUS | Status: AC
  Filled 2020-12-22: qty 20

## 2020-12-22 MED ORDER — PROPOFOL 10 MG/ML IV BOLUS
INTRAVENOUS | Status: DC | PRN
  Administered 2020-12-22: 60 mg via INTRAVENOUS

## 2020-12-22 MED ORDER — PROPOFOL 10 MG/ML IV BOLUS
INTRAVENOUS | Status: DC | PRN
  Administered 2020-12-22: 100 ug/kg/min via INTRAVENOUS

## 2020-12-22 MED ORDER — INDOMETHACIN 50 MG RE SUPP
100.0000 mg | Freq: Once | RECTAL | Status: AC
  Administered 2020-12-22: 100 mg via RECTAL

## 2020-12-22 MED ORDER — SODIUM CHLORIDE 0.9 % IV SOLN
INTRAVENOUS | Status: DC
Start: 2020-12-22 — End: 2020-12-22

## 2020-12-22 NOTE — Anesthesia Postprocedure Evaluation (Signed)
Anesthesia Post Note  [name]  Procedure(s) Performed: ENDOSCOPIC RETROGRADE CHOLANGIOPANCREATOGRAPHY (ERCP) (N/A )  Patient location during evaluation: Endoscopy Anesthesia Type: General Level of consciousness: awake and alert and oriented Pain management: pain level controlled Vital Signs Assessment: post-procedure vital signs reviewed and stable Respiratory status: spontaneous breathing Cardiovascular status: blood pressure returned to baseline Anesthetic complications: no   No complications documented.   Last Vitals:  Vitals:   12/22/20 1131 12/22/20 1141  BP: (!) 163/56 (!) 168/75  Pulse: (!) 56 (!) 54  Resp: (!) 27 (!) 24  Temp:    SpO2: 100% 100%    Last Pain:  Vitals:   12/22/20 1141  TempSrc:   PainSc: 0-No pain                 Sihaam Chrobak

## 2020-12-22 NOTE — Anesthesia Preprocedure Evaluation (Signed)
Anesthesia Evaluation  Patient identified by MRN, date of birth, ID band Patient awake    Reviewed: Allergy & Precautions, NPO status , Patient's Chart, lab work & pertinent test results  History of Anesthesia Complications Negative for: history of anesthetic complications  Airway Mallampati: II       Dental  (+) Edentulous Upper, Missing, Poor Dentition, Chipped   Pulmonary neg sleep apnea, neg COPD, Not current smoker,           Cardiovascular hypertension, Pt. on medications and Pt. on home beta blockers + CAD and + CABG  (-) CHF (-) dysrhythmias (-) Valvular Problems/Murmurs     Neuro/Psych neg Seizures    GI/Hepatic Neg liver ROS, neg GERD  ,  Endo/Other  diabetes, Type 2, Oral Hypoglycemic Agents  Renal/GU Renal InsufficiencyRenal disease     Musculoskeletal   Abdominal   Peds  Hematology   Anesthesia Other Findings   Reproductive/Obstetrics                             Anesthesia Physical  Anesthesia Plan  ASA: III  Anesthesia Plan: General   Post-op Pain Management:    Induction: Intravenous  PONV Risk Score and Plan: 3  Airway Management Planned:   Additional Equipment:   Intra-op Plan:   Post-operative Plan:   Informed Consent: I have reviewed the patients History and Physical, chart, labs and discussed the procedure including the risks, benefits and alternatives for the proposed anesthesia with the patient or authorized representative who has indicated his/her understanding and acceptance.       Plan Discussed with: CRNA and Surgeon  Anesthesia Plan Comments:         Anesthesia Quick Evaluation

## 2020-12-22 NOTE — H&P (Signed)
Lucilla Lame, MD [address] [place], [place] [address] [phone] Fax : [phone]  Primary Care Physician:  Jerrol Banana., MD Primary Gastroenterologist:  Dr. Allen Norris  Pre-Procedure History & Physical: HPI:  Gabriela [name] is a 87 y.o. female is here for an ERCP.   Past Medical History:  Diagnosis Date  . Arthritis   . Diabetes mellitus ([place])   . Glaucoma   . Hyperlipidemia   . Hypertension   . Shortness of breath    with exertion    Past Surgical History:  Procedure Laterality Date  . CARDIAC CATHETERIZATION  04/22/13  . CHOLECYSTECTOMY    . CORONARY ARTERY BYPASS GRAFT N/A 05/31/2013   Procedure: Coronary Artery Bypass Grafting Times Three Using Left Internal Mammary Artery and Right Saphenous Leg Vein Harvested Endoscopically;  Surgeon: Ivin Poot, MD;  Location: [place];  Service: Open Heart Surgery;  Laterality: N/A;  . ERCP N/A 10/20/2020   Procedure: ENDOSCOPIC RETROGRADE CHOLANGIOPANCREATOGRAPHY (ERCP);  Surgeon: Lucilla Lame, MD;  Location: [facility] ENDOSCOPY;  Service: Endoscopy;  Laterality: N/A;  . EYE SURGERY    . HIP SURGERY     right  . INTRAOPERATIVE TRANSESOPHAGEAL ECHOCARDIOGRAM N/A 05/31/2013   Procedure: INTRAOPERATIVE TRANSESOPHAGEAL ECHOCARDIOGRAM;  Surgeon: Ivin Poot, MD;  Location: [place];  Service: Open Heart Surgery;  Laterality: N/A;  . JOINT REPLACEMENT    . MITRAL VALVE REPAIR N/A 05/31/2013   Procedure: MITRAL VALVE REPAIR (MVR);  Surgeon: Ivin Poot, MD;  Location: [place];  Service: Open Heart Surgery;  Laterality: N/A;  . TONSILLECTOMY      Prior to Admission medications   Medication Sig Start Date End Date Taking? Authorizing Provider  aspirin EC 81 MG tablet Take 81 mg by mouth daily.   Yes [provider]  furosemide (LASIX) 20 MG tablet Take 1 tablet (20 mg total) by mouth daily. 10/23/20  Yes Eulogio Bear U, DO  hydrochlorothiazide (HYDRODIURIL) 25 MG tablet Take 1 tablet (25 mg  total) by mouth daily. 10/06/20  Yes Jerrol Banana., MD  LEVEMIR FLEXTOUCH 100 UNIT/ML FlexPen INJECT 15 UNITS INTO THE SKIN DAILY. IN THE AFTERNOON 08/03/20  Yes Jerrol Banana., MD  metoprolol tartrate (LOPRESSOR) 25 MG tablet TAKE 1/2 TABLET BY MOUTH DAILY 08/25/20  Yes Jerrol Banana., MD  ondansetron (ZOFRAN ODT) 4 MG disintegrating tablet Take 1 tablet (4 mg total) by mouth every 8 (eight) hours as needed. 12/08/20  Yes Menshew, Dannielle Karvonen, PA-C  timolol (TIMOPTIC) 0.5 % ophthalmic solution Place 1 drop into both eyes every morning. 01/03/19  Yes [provider]  ACCU-CHEK AVIVA PLUS test strip USE TO TEST SUGAR TWICE A DAY 12/30/19   Jerrol Banana., MD  ACCU-CHEK FASTCLIX LANCETS MISC To check blood sugars once a day. DX E11.9 08/24/18   Jerrol Banana., MD  Blood Glucose Monitoring Suppl (ACCU-CHEK AVIVA PLUS) w/Device KIT 1 each by Does not apply route daily. Dx E11.9 03/29/18   Jerrol Banana., MD  ferrous sulfate 325 (65 FE) MG tablet TAKE 1 TABLET BY MOUTH EVERY OTHER DAY Patient not taking: No sig reported 08/03/20   Jerrol Banana., MD  Insulin Pen Needle (B-D UF III MINI PEN NEEDLES) 31G X 5 MM MISC USE WITH INSULIN DOSES 08/18/20   Jerrol Banana., MD    Allergies as of 10/23/2020 - Review Complete 10/20/2020  Allergen Reaction Noted  . Sulfa  antibiotics Rash 04/17/2015    Family History  Problem Relation Age of Onset  . Diabetes Sister   . Hypertension Mother     Social History   Socioeconomic History  . Marital status: Widowed    Spouse name: Not on file  . Number of children: 7  . Years of education: Not on file  . Highest education level: 11th grade  Occupational History  . Occupation: retired  Tobacco Use  . Smoking status: Never Smoker  . Smokeless tobacco: Never Used  Vaping Use  . Vaping Use: Never used  Substance and Sexual Activity  . Alcohol use: No  . Drug use: No  . Sexual activity:  Never  Other Topics Concern  . Not on file  Social History Narrative  . Not on file   Social Determinants of Health   Financial Resource Strain: Low Risk   . Difficulty of Paying Living Expenses: Not hard at all  Food Insecurity: No Food Insecurity  . Worried About Charity fundraiser in the Last Year: Never true  . Ran Out of Food in the Last Year: Never true  Transportation Needs: No Transportation Needs  . Lack of Transportation (Medical): No  . Lack of Transportation (Non-Medical): No  Physical Activity: Inactive  . Days of Exercise per Week: 0 days  . Minutes of Exercise per Session: 0 min  Stress: No Stress Concern Present  . Feeling of Stress : Not at all  Social Connections: Moderately Isolated  . Frequency of Communication with Friends and Family: More than three times a week  . Frequency of Social Gatherings with Friends and Family: More than three times a week  . Attends Religious Services: More than 4 times per year  . Active Member of Clubs or Organizations: No  . Attends Archivist Meetings: Never  . Marital Status: Widowed  Intimate Partner Violence: Not At Risk  . Fear of Current or Ex-Partner: No  . Emotionally Abused: No  . Physically Abused: No  . Sexually Abused: No    Review of Systems: See HPI, otherwise negative ROS  Physical Exam: BP (!) 149/74   Pulse (!) 59   Temp (!) 96.8 F (36 C) (Temporal)   Resp 17   Ht '5\' 4"'  (1.626 m)   SpO2 100%   BMI 21.70 kg/m  General:   Alert,  pleasant and cooperative in NAD Head:  Normocephalic and atraumatic. Neck:  Supple; no masses or thyromegaly. Lungs:  Clear throughout to auscultation.    Heart:  Regular rate and rhythm. Abdomen:  Soft, nontender and nondistended. Normal bowel sounds, without guarding, and without rebound.   Neurologic:  Alert and  oriented x4;  grossly normal neurologically.  Impression/Plan: Gabriela [name] is here for an ERCP to be performed for CBD stone  Risks,  benefits, limitations, and alternatives regarding  colonoscopy have been reviewed with the patient.  Questions have been answered.  All parties agreeable.   Lucilla Lame, MD  12/22/2020, 10:15 AM

## 2020-12-22 NOTE — Transfer of Care (Signed)
Immediate Anesthesia Transfer of Care Note  [name]  Procedure(s) Performed: ENDOSCOPIC RETROGRADE CHOLANGIOPANCREATOGRAPHY (ERCP) (N/A )  Patient Location: PACU  Anesthesia Type:MAC  Level of Consciousness: drowsy  Airway & Oxygen Therapy: Patient Spontanous Breathing  Post-op Assessment: Report given to RN and Post -op Vital signs reviewed and stable  Post vital signs: stable  Last Vitals:  Vitals Value Taken Time  BP 112/46 12/22/20 1112  Temp 36 C 12/22/20 1111  Pulse 54 12/22/20 1113  Resp 25 12/22/20 1113  SpO2 100 % 12/22/20 1113  Vitals shown include unvalidated device data.  Last Pain:  Vitals:   12/22/20 1111  TempSrc: Temporal  PainSc: Asleep         Complications: No complications documented.

## 2020-12-22 NOTE — Op Note (Signed)
[facility] Patient Name: Gabriela [name] Procedure Date: 12/22/2020 10:13 AM MRN: [id number] Account #: [id number] Date of Birth: [birth year] Admit Type: Inpatient Age: 87 Room: [facility] ENDO ROOM 4 Gender: Female Note Status: Finalized Procedure:             ERCP Indications:           Common bile duct stone(s), Biliary stent removal Providers:             Lucilla Lame MD, MD Medicines:             Propofol per Anesthesia Complications:         No immediate complications. Procedure:             Pre-Anesthesia Assessment:                        - Prior to the procedure, a History and Physical was                         performed, and patient medications and allergies were                         reviewed. The patient's tolerance of previous                         anesthesia was also reviewed. The risks and benefits                         of the procedure and the sedation options and risks                         were discussed with the patient. All questions were                         answered, and informed consent was obtained. Prior                         Anticoagulants: The patient has taken no previous                         anticoagulant or antiplatelet agents. ASA Grade                         Assessment: II - A patient with mild systemic disease.                         After reviewing the risks and benefits, the patient                         was deemed in satisfactory condition to undergo the                         procedure.                        After obtaining informed consent, the scope was passed                         under direct vision. Throughout the procedure, the  patient's blood pressure, pulse, and oxygen                         saturations were monitored continuously. The [employer] D single use duodenoscope was                         introduced through  the mouth, and used to inject                         contrast into and used to inject contrast into the                         bile duct. The ERCP was accomplished without                         difficulty. The patient tolerated the procedure well. Findings:      A biliary stent was visible on the scout film. One plastic stent       originating in the common bile duct was emerging from the major papilla.       A wire was passed into the biliary tree. One stent was removed from the       common bile duct using a snare. The bile duct was deeply cannulated with       the short-nosed traction sphincterotome. Contrast was injected. I       personally interpreted the bile duct images. There was brisk flow of       contrast through the ducts. Image quality was excellent. Contrast       extended to the entire biliary tree. The lower third of the main bile       duct contained multiple stones. The main bile duct was moderately       dilated. An 8 mm biliary sphincterotomy was made with a traction       (standard) sphincterotome using ERBE electrocautery. There was no       post-sphincterotomy bleeding. The biliary tree was swept with a 15 mm       balloon starting at the bifurcation. Sludge was swept from the duct.       Three stones were removed. No stones remained. Impression:            - One stent from the common bile duct was seen in the                         major papilla.                        - The entire main bile duct was moderately dilated.                        - Choledocholithiasis was found. Complete removal was                         accomplished by biliary sphincterotomy and balloon                         extraction.                        -  One stent was removed from the common bile duct.                        - A biliary sphincterotomy was performed.                        - The biliary tree was swept. Recommendation:        - Discharge patient to home.                         - Clear liquid diet today. Procedure Code(s):     --- Professional ---                        [phone], Endoscopic retrograde cholangiopancreatography                         (ERCP); with removal of foreign body(s) or stent(s)                         from biliary/pancreatic duct(s)                        43264, Endoscopic retrograde cholangiopancreatography                         (ERCP); with removal of calculi/debris from                         biliary/pancreatic duct(s)                        43262, Endoscopic retrograde cholangiopancreatography                         (ERCP); with sphincterotomy/papillotomy                        [phone], Endoscopic catheterization of the biliary                         ductal system, radiological supervision and                         interpretation Diagnosis Code(s):     --- Professional ---                        K80.50, Calculus of bile duct without cholangitis or                         cholecystitis without obstruction                        Z46.59, Encounter for fitting and adjustment of other                         gastrointestinal appliance and device                        K83.8, Other specified diseases of biliary tract CPT copyright 2019 American Medical Association. All rights reserved. The codes documented in this report are preliminary and upon coder  review may  be revised to meet current compliance requirements. Lucilla Lame MD, MD 12/22/2020 11:10:07 AM This report has been signed electronically. Number of Addenda: 0 Note Initiated On: 12/22/2020 10:13 AM Estimated Blood Loss:  Estimated blood loss: none.      [facility]

## 2020-12-23 ENCOUNTER — Encounter: Payer: Self-pay | Admitting: Gastroenterology

## 2021-01-10 ENCOUNTER — Other Ambulatory Visit: Payer: Self-pay | Admitting: Family Medicine

## 2021-01-10 DIAGNOSIS — I10 Essential (primary) hypertension: Secondary | ICD-10-CM

## 2021-01-10 NOTE — Telephone Encounter (Signed)
Requested Prescriptions  Pending Prescriptions Disp Refills  . metoprolol tartrate (LOPRESSOR) 25 MG tablet [Pharmacy Med Name: METOPROLOL TARTRATE 25 MG TAB] 45 tablet 0    Sig: TAKE 1/2 TABLET BY MOUTH EVERY DAY     Cardiovascular:  Beta Blockers Failed - 01/10/2021 12:53 AM      Failed - Last BP in normal range    BP Readings from Last 1 Encounters:  12/22/20 (!) 168/75         Passed - Last Heart Rate in normal range    Pulse Readings from Last 1 Encounters:  12/22/20 (!) 54         Passed - Valid encounter within last 6 months    Recent Outpatient Visits          3 weeks ago Laceration of head without complication, initial encounter   [facility] Jerrol Banana., MD   2 months ago Choledocholithiasis   [facility] Jerrol Banana., MD   5 months ago Type 2 diabetes mellitus with other circulatory complication, unspecified whether long term insulin use [facility])   [facility] Jerrol Banana., MD   10 months ago Type 2 diabetes mellitus with other circulatory complication, unspecified whether long term insulin use [facility])   [facility] Jerrol Banana., MD   1 year ago Type 2 diabetes mellitus with other circulatory complication, unspecified whether long term insulin use [facility])   [facility] Jerrol Banana., MD      Future Appointments            In 2 months Jerrol Banana., MD [name]

## 2021-02-07 ENCOUNTER — Other Ambulatory Visit: Payer: Self-pay | Admitting: Family Medicine

## 2021-02-07 NOTE — Telephone Encounter (Signed)
Requested medication (s) are due for refill today: yes  Requested medication (s) are on the active medication list: yes  Last refill:  10/22/20  Future visit scheduled: yes  Notes to clinic:  last RF at hospital discharge Dr Eliseo Squires   Requested Prescriptions  Pending Prescriptions Disp Refills   furosemide (LASIX) 20 MG tablet [Pharmacy Med Name: FUROSEMIDE 20 MG TABLET] 90 tablet     Sig: TAKE 1 TABLET BY MOUTH EVERY DAY      Cardiovascular:  Diuretics - Loop Failed - 02/07/2021 12:47 AM      Failed - Ca in normal range and within 360 days    Calcium  Date Value Ref Range Status  12/08/2020 8.7 (L) 8.9 - 10.3 mg/dL Final   Calcium, Ion  Date Value Ref Range Status  06/01/2013 1.28 1.13 - 1.30 mmol/L Final          Failed - Na in normal range and within 360 days    Sodium  Date Value Ref Range Status  12/08/2020 133 (L) 135 - 145 mmol/L Final  10/28/2020 139 134 - 144 mmol/L Final          Failed - Cr in normal range and within 360 days    Creat  Date Value Ref Range Status  06/13/2017 1.33 (H) 0.60 - 0.88 mg/dL Final    Comment:    For patients >53 years of age, the reference limit for Creatinine is approximately 13% higher for people identified as African-American. .    Creatinine, Ser  Date Value Ref Range Status  12/08/2020 1.33 (H) 0.44 - 1.00 mg/dL Final          Failed - Last BP in normal range    BP Readings from Last 1 Encounters:  12/22/20 (!) 168/75          Passed - K in normal range and within 360 days    Potassium  Date Value Ref Range Status  12/08/2020 3.9 3.5 - 5.1 mmol/L Final          Passed - Valid encounter within last 6 months    Recent Outpatient Visits           1 month ago Laceration of head without complication, initial encounter   [facility] Jerrol Banana., MD   3 months ago Choledocholithiasis   [facility] Jerrol Banana., MD   6 months ago Type 2 diabetes mellitus with  other circulatory complication, unspecified whether long term insulin use [facility])   [facility] Jerrol Banana., MD   11 months ago Type 2 diabetes mellitus with other circulatory complication, unspecified whether long term insulin use [facility])   [facility] Jerrol Banana., MD   1 year ago Type 2 diabetes mellitus with other circulatory complication, unspecified whether long term insulin use [facility])   [facility] Jerrol Banana., MD       Future Appointments             In 1 month Jerrol Banana., MD [facility]

## 2021-02-09 ENCOUNTER — Other Ambulatory Visit: Payer: Self-pay | Admitting: Family Medicine

## 2021-02-10 ENCOUNTER — Other Ambulatory Visit: Payer: Self-pay

## 2021-02-10 ENCOUNTER — Emergency Department: Payer: Medicare Other

## 2021-02-10 ENCOUNTER — Emergency Department
Admission: EM | Admit: 2021-02-10 | Discharge: 2021-02-10 | Disposition: A | Discharge status: Home / Self Care | Payer: Medicare Other | Attending: Emergency Medicine | Admitting: Emergency Medicine

## 2021-02-10 DIAGNOSIS — R404 Transient alteration of awareness: Secondary | ICD-10-CM | POA: Diagnosis not present

## 2021-02-10 DIAGNOSIS — E119 Type 2 diabetes mellitus without complications: Secondary | ICD-10-CM | POA: Insufficient documentation

## 2021-02-10 DIAGNOSIS — R55 Syncope and collapse: Secondary | ICD-10-CM

## 2021-02-10 DIAGNOSIS — R42 Dizziness and giddiness: Secondary | ICD-10-CM | POA: Diagnosis not present

## 2021-02-10 DIAGNOSIS — Z951 Presence of aortocoronary bypass graft: Secondary | ICD-10-CM | POA: Insufficient documentation

## 2021-02-10 DIAGNOSIS — N390 Urinary tract infection, site not specified: Secondary | ICD-10-CM | POA: Diagnosis not present

## 2021-02-10 DIAGNOSIS — I2510 Atherosclerotic heart disease of native coronary artery without angina pectoris: Secondary | ICD-10-CM | POA: Diagnosis not present

## 2021-02-10 DIAGNOSIS — I10 Essential (primary) hypertension: Secondary | ICD-10-CM | POA: Insufficient documentation

## 2021-02-10 DIAGNOSIS — Z20822 Contact with and (suspected) exposure to covid-19: Secondary | ICD-10-CM | POA: Insufficient documentation

## 2021-02-10 DIAGNOSIS — Z7982 Long term (current) use of aspirin: Secondary | ICD-10-CM | POA: Diagnosis not present

## 2021-02-10 DIAGNOSIS — Z9660 Presence of unspecified orthopedic joint implant: Secondary | ICD-10-CM | POA: Diagnosis not present

## 2021-02-10 DIAGNOSIS — Z79899 Other long term (current) drug therapy: Secondary | ICD-10-CM | POA: Insufficient documentation

## 2021-02-10 DIAGNOSIS — Z743 Need for continuous supervision: Secondary | ICD-10-CM | POA: Diagnosis not present

## 2021-02-10 DIAGNOSIS — Z794 Long term (current) use of insulin: Secondary | ICD-10-CM | POA: Diagnosis not present

## 2021-02-10 DIAGNOSIS — R6889 Other general symptoms and signs: Secondary | ICD-10-CM | POA: Diagnosis not present

## 2021-02-10 LAB — URINALYSIS, COMPLETE (UACMP) WITH MICROSCOPIC
Bilirubin Urine: NEGATIVE
Glucose, UA: NEGATIVE mg/dL
Hgb urine dipstick: NEGATIVE
Ketones, ur: NEGATIVE mg/dL
Nitrite: NEGATIVE
Protein, ur: NEGATIVE mg/dL
Specific Gravity, Urine: 1.008 (ref 1.005–1.030)
pH: 7 (ref 5.0–8.0)

## 2021-02-10 LAB — BASIC METABOLIC PANEL
Anion gap: 13 (ref 5–15)
BUN: 52 mg/dL — ABNORMAL HIGH (ref 8–23)
CO2: 22 mmol/L (ref 22–32)
Calcium: 9.1 mg/dL (ref 8.9–10.3)
Chloride: 104 mmol/L (ref 98–111)
Creatinine, Ser: 1.35 mg/dL — ABNORMAL HIGH (ref 0.44–1.00)
GFR, Estimated: 37 mL/min — ABNORMAL LOW (ref >60)
Glucose, Bld: 120 mg/dL — ABNORMAL HIGH (ref 70–99)
Potassium: 3.6 mmol/L (ref 3.5–5.1)
Sodium: 139 mmol/L (ref 135–145)

## 2021-02-10 LAB — RESP PANEL BY RT-PCR (FLU A&B, COVID) ARPGX2
Influenza A by PCR: NEGATIVE
Influenza B by PCR: NEGATIVE
SARS Coronavirus 2 by RT PCR: NEGATIVE

## 2021-02-10 LAB — CBC
HCT: 26.4 % — ABNORMAL LOW (ref 36.0–46.0)
Hemoglobin: 8.6 g/dL — ABNORMAL LOW (ref 12.0–15.0)
MCH: 25.6 pg — ABNORMAL LOW (ref 26.0–34.0)
MCHC: 32.6 g/dL (ref 30.0–36.0)
MCV: 78.6 fL — ABNORMAL LOW (ref 80.0–100.0)
Platelets: 109 10*3/uL — ABNORMAL LOW (ref 150–400)
RBC: 3.36 MIL/uL — ABNORMAL LOW (ref 3.87–5.11)
RDW: 17 % — ABNORMAL HIGH (ref 11.5–15.5)
WBC: 6.6 10*3/uL (ref 4.0–10.5)
nRBC: 0 % (ref 0.0–0.2)

## 2021-02-10 LAB — TROPONIN I (HIGH SENSITIVITY): Troponin I (High Sensitivity): 11 ng/L (ref <18)

## 2021-02-10 MED ORDER — SODIUM CHLORIDE 0.9 % IV SOLN
1.0000 g | Freq: Once | INTRAVENOUS | Status: AC
  Administered 2021-02-10: 1 g via INTRAVENOUS
  Filled 2021-02-10: qty 10

## 2021-02-10 MED ORDER — CEPHALEXIN 250 MG PO CAPS: 250.0000 mg | ORAL_CAPSULE | Freq: Two times a day (BID) | ORAL | 0 refills | Status: AC

## 2021-02-10 MED ORDER — SODIUM CHLORIDE 0.9 % IV BOLUS
1000.0000 mL | Freq: Once | INTRAVENOUS | Status: AC
  Administered 2021-02-10: 1000 mL via INTRAVENOUS

## 2021-02-10 NOTE — ED Provider Notes (Signed)
[facility] Emergency Department Provider Note  Time seen: 10:22 AM  I have reviewed the triage vital signs and the nursing notes.   HISTORY  Chief Complaint Near Syncope   HPI Gabriela [name] is a 87 y.o. female with a past medical history of diabetes, hypertension, hyperlipidemia, arthritis, presents to the emergency department for dizziness.  Cording to the patient she woke up and she felt dizzy this morning.  Son is here with the patient.  Patient states she is actually feeling much better right now.  Denies any recent nausea vomiting or diarrhea.  Denies any dysuria.  Denies any chest pain or abdominal pain.  Denies any black or bloody stool.   Past Medical History:  Diagnosis Date   Arthritis    Diabetes mellitus ([place])    Glaucoma    Hyperlipidemia    Hypertension    Shortness of breath    with exertion    Patient Active Problem List   Diagnosis Date Noted   Encounter for adjustment or removal of myringotomy device (stent) (tube)    Choledocholithiasis    Common bile duct dilatation    Abnormal findings on imaging of biliary tract    Elevated LFTs 10/18/2020   Wound of thigh 03/30/2018   History of revision of total replacement of right hip joint 01/20/2016   Abnormal LFTs (liver function tests) 04/17/2015   Anemia of diabetes 04/17/2015   Atherosclerosis of coronary artery 04/17/2015   Diabetic kidney ([place]) 04/17/2015   Essential (primary) hypertension 04/17/2015   Glaucoma 04/17/2015   HLD (hyperlipidemia) 04/17/2015   Disorder of kidney 04/17/2015   Arthritis, degenerative 04/17/2015   Arthritis, hip 04/26/2013   Left main coronary artery disease 04/26/2013   Ischemic mitral regurgitation 04/26/2013   Hypertension    Diabetes mellitus ([place])    Hyperlipidemia     Past Surgical History:  Procedure Laterality Date   CARDIAC CATHETERIZATION  04/22/13   CHOLECYSTECTOMY     CORONARY ARTERY BYPASS GRAFT N/A 05/31/2013   Procedure:  Coronary Artery Bypass Grafting Times Three Using Left Internal Mammary Artery and Right Saphenous Leg Vein Harvested Endoscopically;  Surgeon: Ivin Poot, MD;  Location: [place];  Service: Open Heart Surgery;  Laterality: N/A;   ERCP N/A 10/20/2020   Procedure: ENDOSCOPIC RETROGRADE CHOLANGIOPANCREATOGRAPHY (ERCP);  Surgeon: Lucilla Lame, MD;  Location: [facility] ENDOSCOPY;  Service: Endoscopy;  Laterality: N/A;   ERCP N/A 12/22/2020   Procedure: ENDOSCOPIC RETROGRADE CHOLANGIOPANCREATOGRAPHY (ERCP);  Surgeon: Lucilla Lame, MD;  Location[facility] ENDOSCOPY;  Service: Endoscopy;  Laterality: N/A;   EYE SURGERY     HIP SURGERY     right   INTRAOPERATIVE TRANSESOPHAGEAL ECHOCARDIOGRAM N/A 05/31/2013   Procedure: INTRAOPERATIVE TRANSESOPHAGEAL ECHOCARDIOGRAM;  Surgeon: Ivin Poot, MD;  Location: [place];  Service: Open Heart Surgery;  Laterality: N/A;   JOINT REPLACEMENT     MITRAL VALVE REPAIR N/A 05/31/2013   Procedure: MITRAL VALVE REPAIR (MVR);  Surgeon: Ivin Poot, MD;  Location: [place];  Service: Open Heart Surgery;  Laterality: N/A;   TONSILLECTOMY      Prior to Admission medications   Medication Sig Start Date End Date Taking? Authorizing Provider  ACCU-CHEK AVIVA PLUS test strip USE TO TEST SUGAR TWICE A DAY 02/09/21   Jerrol Banana., MD  ACCU-CHEK FASTCLIX LANCETS MISC To check blood sugars once a day. DX E11.9 08/24/18   Jerrol Banana., MD  aspirin EC 81 MG tablet Take 81 mg by mouth  daily.    [provider]  Blood Glucose Monitoring Suppl (ACCU-CHEK AVIVA PLUS) w/Device KIT 1 each by Does not apply route daily. Dx E11.9 03/29/18   Jerrol Banana., MD  ferrous sulfate 325 (65 FE) MG tablet TAKE 1 TABLET BY MOUTH EVERY OTHER DAY Patient not taking: No sig reported 08/03/20   Jerrol Banana., MD  furosemide (LASIX) 20 MG tablet TAKE 1 TABLET BY MOUTH EVERY DAY 02/08/21   Bacigalupo, Dionne Bucy, MD  hydrochlorothiazide (HYDRODIURIL) 25 MG tablet Take 1  tablet (25 mg total) by mouth daily. 10/06/20   Jerrol Banana., MD  Insulin Pen Needle (B-D UF III MINI PEN NEEDLES) 31G X 5 MM MISC USE WITH INSULIN DOSES 08/18/20   Jerrol Banana., MD  LEVEMIR FLEXTOUCH 100 UNIT/ML FlexPen INJECT 15 UNITS INTO THE SKIN DAILY. IN THE AFTERNOON 08/03/20   Jerrol Banana., MD  metoprolol tartrate (LOPRESSOR) 25 MG tablet TAKE 1/2 TABLET BY MOUTH EVERY DAY 01/10/21   Jerrol Banana., MD  ondansetron (ZOFRAN ODT) 4 MG disintegrating tablet Take 1 tablet (4 mg total) by mouth every 8 (eight) hours as needed. 12/08/20   Menshew, Dannielle Karvonen, PA-C  timolol (TIMOPTIC) 0.5 % ophthalmic solution Place 1 drop into both eyes every morning. 01/03/19   [provider]    Allergies  Allergen Reactions   Sulfa Antibiotics Rash    Family History  Problem Relation Age of Onset   Diabetes Sister    Hypertension Mother     Social History Social History   Tobacco Use   Smoking status: Never   Smokeless tobacco: Never  Vaping Use   Vaping Use: Never used  Substance Use Topics   Alcohol use: No   Drug use: No    Review of Systems Constitutional: Negative for fever.  Positive for dizziness, improved. Cardiovascular: Negative for chest pain. Respiratory: Negative for shortness of breath. Gastrointestinal: Negative for abdominal pain, vomiting and diarrhea. Genitourinary: Negative for urinary compaints Musculoskeletal: Negative for musculoskeletal complaints Neurological: Negative for headache All other ROS negative  ____________________________________________   PHYSICAL EXAM:  VITAL SIGNS: ED Triage Vitals  Enc Vitals Group     BP 02/10/21 0812 (!) 143/66     Pulse Rate 02/10/21 0812 74     Resp 02/10/21 0812 18     Temp 02/10/21 0812 98.2 F (36.8 C)     Temp Source 02/10/21 0812 Oral     SpO2 02/10/21 0812 100 %     Weight 02/10/21 0809 126 lb (57.2 kg)     Height 02/10/21 0809 '5\' 4"'  (1.626 m)     Head  Circumference --      Peak Flow --      Pain Score 02/10/21 0809 0     Pain Loc --      Pain Edu? --      Excl. in [place]? --    Constitutional: Alert and oriented. Well appearing and in no distress. Eyes: Normal exam ENT      Head: Normocephalic and atraumatic.      Mouth/Throat: Mucous membranes are moist. Cardiovascular: Normal rate, regular rhythm.  Respiratory: Normal respiratory effort without tachypnea nor retractions. Breath sounds are clear  Gastrointestinal: Soft and nontender. No distention. Musculoskeletal: Nontender with normal range of motion in all extremities. Neurologic:  Normal speech and language. No gross focal neurologic deficits  Skin:  Skin is warm, dry and intact.  Psychiatric: Mood and  affect are normal.   ____________________________________________    EKG  EKG viewed and interpreted by myself shows a normal sinus rhythm at 74 bpm with a narrow QRS, normal axis, normal intervals.  Patient does have lateral T wave inversions, also present in 2019.  ____________________________________________    RADIOLOGY  CT head negative for acute abnormality.  ____________________________________________   INITIAL IMPRESSION / ASSESSMENT AND PLAN / ED COURSE  Pertinent labs & imaging results that were available during my care of the patient were reviewed by me and considered in my medical decision making (see chart for details).   Patient presents emergency department for dizziness.  Son is here states approximately 1.5 weeks ago patient had a fall hitting the back left of her head and has been complaining of some dizziness since then, symptoms were somewhat worse this morning.  Patient states she is actually feeling better currently.  Patient's lab work is largely unchanged from baseline.  Slight drop in her hemoglobin.  Rectal examination shows light brown stool guaiac negative.  Chemistry also indicates mild dehydration with anion gap of 13.  We will IV hydrate.   Given the recent fall with dizziness we will obtain a CT scan to rule out subdural or other intracranial abnormality.  We will also continue to monitor while awaiting urinalysis results.  Patient agreeable to plan of care.  CT scan head is negative.  Urinalysis shows mild urinary tract infection.  We will send urine culture and dose a one-time dose of antibiotics.  We will discharge patient home with a short course of antibiotics and PCP follow-up.  Patient appears well vitals are reassuring.  Son agreeable to plan of care.  Quandra Fedorchak Razzano was evaluated in Emergency Department on 02/10/2021 for the symptoms described in the history of present illness. She was evaluated in the context of the global COVID-19 pandemic, which necessitated consideration that the patient might be at risk for infection with the SARS-CoV-2 virus that causes COVID-19. Institutional protocols and algorithms that pertain to the evaluation of patients at risk for COVID-19 are in a state of rapid change based on information released by regulatory bodies including the CDC and federal and state organizations. These policies and algorithms were followed during the patient's care in the ED.  ____________________________________________   FINAL CLINICAL IMPRESSION(S) / ED DIAGNOSES  Dizziness   Harvest Dark, MD 02/10/21 1419

## 2021-02-10 NOTE — ED Notes (Signed)
Pt caregiver requesting CT scan after fall hitting head 2 weeks ago. Dr Kerman Passey notitifed

## 2021-02-10 NOTE — ED Triage Notes (Signed)
Pt comes into the ED via [facility] from home c/o dizziness.  Pt states that she woke up this morning with it so LKW was last night.  CBG 115.  Pt is diabetic and takes insulin in the evening.  H/o bypass and DM.  182/82, 98 %, NSR, no generalized sickness symptoms.  Pt had a negative stroke screen with EMS.

## 2021-02-10 NOTE — ED Notes (Signed)
Additional history per pt's granddaughter who is the primary care giver: pt had an endoscopy in March and again in early May after ICU admission related to pancreatitis/gallstones (absent gallbladder) with stent and successful removal of stones. Since scope #2, pt has been increasingly "off." Anemia is stable per hematologist. This morning pt was too dizzy to get dressed and called family to help because she was seated in her living room with no shirt and could not tolerate standing to finish getting ready for the day. She stayed dizzy like that for about an hour before they called EMS. Pt states dizziness has since resolved while in the ED.

## 2021-02-10 NOTE — ED Notes (Signed)
See triage note, pt reports generalized dizziness and not feeling well today. Reports recent fall a couple weeks ago hitting head. Pt in NAD. Call bell in reach

## 2021-02-14 LAB — URINE CULTURE: Culture: >=100000 — AB

## 2021-03-08 ENCOUNTER — Other Ambulatory Visit: Payer: Self-pay

## 2021-03-08 DIAGNOSIS — E118 Type 2 diabetes mellitus with unspecified complications: Secondary | ICD-10-CM

## 2021-03-08 NOTE — Telephone Encounter (Signed)
Copied from [place] [id number]. Topic: General - Other >> Mar 08, 2021 10:31 AM Loma [place] wrote: Blood Glucose Monitoring Suppl (ACCU-CHEK AVIVA PLUS) w/Device KIT 1 kit 0 03/29/2018   Sig - Route: 1 each by Does not apply route daily. Dx E11.9 - Does not apply  Pt is having a lot of issues with Blood G monitor, not able to take keeps saying error. Much of the time giving trouble. Pt is requesting a new monitor and everything that goes with a new updated one. Please send scripts [id number] [place] [place] [place]Phone: [phone]: [phone]

## 2021-03-08 NOTE — Telephone Encounter (Signed)
Please review.  It is okay to order pt a new glucose monitor.   Thanks,   -Mickel Baas

## 2021-03-09 ENCOUNTER — Inpatient Hospital Stay: Payer: Medicare Other

## 2021-03-09 MED ORDER — ACCU-CHEK AVIVA PLUS W/DEVICE KIT: 1.0000 | PACK | Freq: Every day | 0 refills | Status: DC

## 2021-03-09 MED ORDER — ACCU-CHEK AVIVA PLUS VI STRP: ORAL_STRIP | 1 refills | Status: DC

## 2021-03-09 MED ORDER — ACCU-CHEK FASTCLIX LANCETS MISC: 12 refills | Status: DC

## 2021-03-15 DIAGNOSIS — H40153 Residual stage of open-angle glaucoma, bilateral: Secondary | ICD-10-CM | POA: Diagnosis not present

## 2021-03-16 ENCOUNTER — Inpatient Hospital Stay: Payer: Medicare Other | Admitting: Oncology

## 2021-03-22 ENCOUNTER — Ambulatory Visit: Payer: Self-pay | Admitting: Family Medicine

## 2021-03-25 ENCOUNTER — Encounter: Payer: Self-pay | Admitting: Family Medicine

## 2021-03-25 ENCOUNTER — Ambulatory Visit (INDEPENDENT_AMBULATORY_CARE_PROVIDER_SITE_OTHER): Payer: Medicare Other | Admitting: Family Medicine

## 2021-03-25 ENCOUNTER — Other Ambulatory Visit: Payer: Self-pay

## 2021-03-25 ENCOUNTER — Ambulatory Visit: Payer: Self-pay | Admitting: Family Medicine

## 2021-03-25 VITALS — BP 132/68 | HR 64 | Temp 97.7°F | Resp 16 | Ht 64.0 in | Wt 126.6 lb

## 2021-03-25 DIAGNOSIS — E7800 Pure hypercholesterolemia, unspecified: Secondary | ICD-10-CM

## 2021-03-25 DIAGNOSIS — E1121 Type 2 diabetes mellitus with diabetic nephropathy: Secondary | ICD-10-CM | POA: Diagnosis not present

## 2021-03-25 DIAGNOSIS — D649 Anemia, unspecified: Secondary | ICD-10-CM

## 2021-03-25 DIAGNOSIS — E1159 Type 2 diabetes mellitus with other circulatory complications: Secondary | ICD-10-CM

## 2021-03-25 DIAGNOSIS — I10 Essential (primary) hypertension: Secondary | ICD-10-CM | POA: Diagnosis not present

## 2021-03-25 DIAGNOSIS — Z96641 Presence of right artificial hip joint: Secondary | ICD-10-CM

## 2021-03-25 DIAGNOSIS — I2510 Atherosclerotic heart disease of native coronary artery without angina pectoris: Secondary | ICD-10-CM

## 2021-03-25 DIAGNOSIS — E782 Mixed hyperlipidemia: Secondary | ICD-10-CM

## 2021-03-25 LAB — POCT GLYCOSYLATED HEMOGLOBIN (HGB A1C)
Est. average glucose Bld gHb Est-mCnc: 131
Hemoglobin A1C: 6.2 % — AB (ref 4.0–5.6)

## 2021-03-25 NOTE — Progress Notes (Signed)
I,Gabriela [name],acting as a scribe for Wilhemena Durie, MD.,have documented all relevant documentation on the behalf of Wilhemena Durie, MD,as directed by  Wilhemena Durie, MD while in the presence of Wilhemena Durie, MD.   Established patient visit   [name]   DOB: 11/12/1925   87 y.o. Female  MRN: [id number] Visit Date: 03/25/2021  Today's healthcare provider: Wilhemena Durie, MD   Chief Complaint  Patient presents with   Follow-up   Diabetes   Hypertension   Subjective    HPI  Patient is doing well overall.  She has no hypoglycemia and no issues presently.  Has had 2 of her daughters died this year so this is been very stressful.  Diabetes Mellitus Type II, follow-up  Lab Results  Component Value Date   HGBA1C 6.2 (A) 03/25/2021   HGBA1C 6.6 (H) 10/19/2020   HGBA1C 6.6 (A) 07/28/2020   Last seen for diabetes 5 months ago.  Management since then includes continuing the same treatment. She reports good compliance with treatment. She is not having side effects. none  Home blood sugar records: fasting range: 135  Episodes of hypoglycemia? No none   Current insulin regiment: n/a Most Recent Eye Exam: due  -----------------------------------------------------------------------------------------------  Hypertension, follow-up  BP Readings from Last 3 Encounters:  03/25/21 132/68  02/10/21 (!) 158/65  12/22/20 (!) 168/75   Wt Readings from Last 3 Encounters:  03/25/21 126 lb 9.6 oz (57.4 kg)  02/10/21 126 lb (57.2 kg)  12/14/20 126 lb 6.4 oz (57.3 kg)     She was last seen for hypertension 5 months ago.  BP at that visit was 126/66. Management since that visit includes; Good control. She reports good compliance with treatment. She is not having side effects. none She is not exercising. She is adherent to low salt diet.   Outside blood pressures are not checking.  She does not smoke.  Use of agents associated with  hypertension: none.   -----------------------------------------------------------------------------------------------  Follow up for Anemia  The patient was last seen for this 5 months ago. Changes made at last visit include; Follow-up CBC for anemia of chronic disease secondary to CKD.  She reports good compliance with treatment. She feels that condition is Unchanged. She is not having side effects. none  -----------------------------------------------------------------------------------------        Medications: Outpatient Medications Prior to Visit  Medication Sig   Accu-Chek FastClix Lancets MISC To check blood sugars once a day. DX E11.9   aspirin EC 81 MG tablet Take 81 mg by mouth daily.   Blood Glucose Monitoring Suppl (ACCU-CHEK AVIVA PLUS) w/Device KIT 1 each by Does not apply route daily. Dx E11.9   ferrous sulfate 325 (65 FE) MG tablet TAKE 1 TABLET BY MOUTH EVERY OTHER DAY   furosemide (LASIX) 20 MG tablet TAKE 1 TABLET BY MOUTH EVERY DAY   glucose blood (ACCU-CHEK AVIVA PLUS) test strip Use as instructed DX E11.9   hydrochlorothiazide (HYDRODIURIL) 25 MG tablet Take 1 tablet (25 mg total) by mouth daily.   Insulin Pen Needle (B-D UF III MINI PEN NEEDLES) 31G X 5 MM MISC USE WITH INSULIN DOSES   LEVEMIR FLEXTOUCH 100 UNIT/ML FlexPen INJECT 15 UNITS INTO THE SKIN DAILY. IN THE AFTERNOON   metoprolol tartrate (LOPRESSOR) 25 MG tablet TAKE 1/2 TABLET BY MOUTH EVERY DAY   ondansetron (ZOFRAN ODT) 4 MG disintegrating tablet Take 1 tablet (4 mg total) by mouth every 8 (eight) hours as  needed.   timolol (TIMOPTIC) 0.5 % ophthalmic solution Place 1 drop into both eyes every morning.   No facility-administered medications prior to visit.    Review of Systems      Objective    BP 132/68 (BP Location: Right Arm, Patient Position: Sitting, Cuff Size: Normal)   Pulse 64   Temp 97.7 F (36.5 C) (Temporal)   Resp 16   Ht '5\' 4"'  (1.626 m)   Wt 126 lb 9.6 oz (57.4 kg)    SpO2 99%   BMI 21.73 kg/m  BP Readings from Last 3 Encounters:  03/25/21 132/68  02/10/21 (!) 158/65  12/22/20 (!) 168/75   Wt Readings from Last 3 Encounters:  03/25/21 126 lb 9.6 oz (57.4 kg)  02/10/21 126 lb (57.2 kg)  12/14/20 126 lb 6.4 oz (57.3 kg)       Physical Exam Vitals and nursing note reviewed.  Constitutional:      Appearance: Normal appearance. She is normal weight.  HENT:     Right Ear: Tympanic membrane normal.     Left Ear: Tympanic membrane normal.     Nose: Nose normal.     Mouth/Throat:     Mouth: Mucous membranes are moist.     Pharynx: Oropharynx is clear.  Eyes:     Conjunctiva/sclera: Conjunctivae normal.  Cardiovascular:     Rate and Rhythm: Normal rate and regular rhythm.     Pulses: Normal pulses.     Heart sounds: Normal heart sounds.  Pulmonary:     Effort: Pulmonary effort is normal.     Breath sounds: Normal breath sounds.  Abdominal:     General: Bowel sounds are normal.     Palpations: Abdomen is soft.  Musculoskeletal:     Cervical back: Normal range of motion and neck supple.     Right lower leg: No edema.     Left lower leg: No edema.  Skin:    General: Skin is warm and dry.  Neurological:     General: No focal deficit present.     Mental Status: She is alert and oriented to person, place, and time.  Psychiatric:        Mood and Affect: Mood normal.        Behavior: Behavior normal.        Thought Content: Thought content normal.        Judgment: Judgment normal.      Results for orders placed or performed in visit on 03/25/21  POCT glycosylated hemoglobin (Hb A1C)  Result Value Ref Range   Hemoglobin A1C 6.2 (A) 4.0 - 5.6 %   Est. average glucose Bld gHb Est-mCnc 131     Assessment & Plan     1. Type 2 diabetes mellitus with other circulatory complication, unspecified whether long term insulin use (HCC) A1C is 6.2 today in the office. Will decrease Insulin from 12 units daily to 10 units daily.  Make every effort  to avoid any hypoglycemia in this 87 year old - POCT glycosylated hemoglobin (Hb A1C)  2. Essential (primary) hypertension  3. Left main coronary artery disease All risk factors treated  4. Diabetic nephropathy associated with type 2 diabetes mellitus ([place]) Also the source of anemia  5. Pure hypercholesterolemia Last LDL was 34  6. Mixed hyperlipidemia   7. History of revision of total replacement of right hip joint   8. Anemia, unspecified type   Return in about 4 months (around 07/25/2021).      Theophilus Kinds  Cranford Mon, MD, have reviewed all documentation for this visit. The documentation on 04/03/21 for the exam, diagnosis, procedures, and orders are all accurate and complete.    Sahithi Ordoyne Cranford Mon, MD  [facility] [phone] (phone) [phone] (fax)  [place]

## 2021-03-25 NOTE — Patient Instructions (Signed)
Decrease insulin from 12 units daily to 10 units daily.  Recommend getting a 2nd COVID booster vaccine.

## 2021-04-01 ENCOUNTER — Other Ambulatory Visit: Payer: Self-pay

## 2021-04-01 ENCOUNTER — Inpatient Hospital Stay: Payer: Medicare Other | Attending: Oncology

## 2021-04-01 DIAGNOSIS — N183 Chronic kidney disease, stage 3 unspecified: Secondary | ICD-10-CM | POA: Diagnosis not present

## 2021-04-01 DIAGNOSIS — Z03818 Encounter for observation for suspected exposure to other biological agents ruled out: Secondary | ICD-10-CM | POA: Diagnosis not present

## 2021-04-01 DIAGNOSIS — R7989 Other specified abnormal findings of blood chemistry: Secondary | ICD-10-CM

## 2021-04-01 DIAGNOSIS — Z20822 Contact with and (suspected) exposure to covid-19: Secondary | ICD-10-CM | POA: Diagnosis not present

## 2021-04-01 DIAGNOSIS — D631 Anemia in chronic kidney disease: Secondary | ICD-10-CM | POA: Diagnosis not present

## 2021-04-01 DIAGNOSIS — D696 Thrombocytopenia, unspecified: Secondary | ICD-10-CM | POA: Insufficient documentation

## 2021-04-01 DIAGNOSIS — D472 Monoclonal gammopathy: Secondary | ICD-10-CM | POA: Diagnosis not present

## 2021-04-01 LAB — CBC WITH DIFFERENTIAL/PLATELET
Abs Immature Granulocytes: 0.23 10*3/uL — ABNORMAL HIGH (ref 0.00–0.07)
Basophils Absolute: 0 10*3/uL (ref 0.0–0.1)
Basophils Relative: 1 %
Eosinophils Absolute: 0 10*3/uL (ref 0.0–0.5)
Eosinophils Relative: 0 %
HCT: 29 % — ABNORMAL LOW (ref 36.0–46.0)
Hemoglobin: 9.2 g/dL — ABNORMAL LOW (ref 12.0–15.0)
Immature Granulocytes: 3 %
Lymphocytes Relative: 39 %
Lymphs Abs: 2.6 10*3/uL (ref 0.7–4.0)
MCH: 25.3 pg — ABNORMAL LOW (ref 26.0–34.0)
MCHC: 31.7 g/dL (ref 30.0–36.0)
MCV: 79.7 fL — ABNORMAL LOW (ref 80.0–100.0)
Monocytes Absolute: 0.7 10*3/uL (ref 0.1–1.0)
Monocytes Relative: 10 %
Neutro Abs: 3.1 10*3/uL (ref 1.7–7.7)
Neutrophils Relative %: 47 %
Platelets: 85 10*3/uL — ABNORMAL LOW (ref 150–400)
RBC: 3.64 MIL/uL — ABNORMAL LOW (ref 3.87–5.11)
RDW: 16.9 % — ABNORMAL HIGH (ref 11.5–15.5)
Smear Review: NORMAL
WBC: 6.7 10*3/uL (ref 4.0–10.5)
nRBC: 0 % (ref 0.0–0.2)

## 2021-04-01 LAB — COMPREHENSIVE METABOLIC PANEL
ALT: 9 U/L (ref 0–44)
AST: 18 U/L (ref 15–41)
Albumin: 4.1 g/dL (ref 3.5–5.0)
Alkaline Phosphatase: 95 U/L (ref 38–126)
Anion gap: 9 (ref 5–15)
BUN: 48 mg/dL — ABNORMAL HIGH (ref 8–23)
CO2: 25 mmol/L (ref 22–32)
Calcium: 9 mg/dL (ref 8.9–10.3)
Chloride: 103 mmol/L (ref 98–111)
Creatinine, Ser: 1.43 mg/dL — ABNORMAL HIGH (ref 0.44–1.00)
GFR, Estimated: 34 mL/min — ABNORMAL LOW (ref >60)
Glucose, Bld: 101 mg/dL — ABNORMAL HIGH (ref 70–99)
Potassium: 4.1 mmol/L (ref 3.5–5.1)
Sodium: 137 mmol/L (ref 135–145)
Total Bilirubin: 0.8 mg/dL (ref 0.3–1.2)
Total Protein: 9.2 g/dL — ABNORMAL HIGH (ref 6.5–8.1)

## 2021-04-01 LAB — IMMATURE PLATELET FRACTION: Immature Platelet Fraction: 20.3 % — ABNORMAL HIGH (ref 1.2–8.6)

## 2021-04-01 LAB — IRON AND TIBC
Iron: 80 ug/dL (ref 28–170)
Saturation Ratios: 29 % (ref 10.4–31.8)
TIBC: 273 ug/dL (ref 250–450)
UIBC: 193 ug/dL

## 2021-04-01 LAB — RETIC PANEL
Immature Retic Fract: 6 % (ref 2.3–15.9)
RBC.: 3.65 MIL/uL — ABNORMAL LOW (ref 3.87–5.11)
Retic Count, Absolute: 35 10*3/uL (ref 19.0–186.0)
Retic Ct Pct: 1 % (ref 0.4–3.1)
Reticulocyte Hemoglobin: 27.7 pg — ABNORMAL LOW (ref >27.9)

## 2021-04-01 LAB — LACTATE DEHYDROGENASE: LDH: 154 U/L (ref 98–192)

## 2021-04-01 LAB — FERRITIN: Ferritin: 388 ng/mL — ABNORMAL HIGH (ref 11–307)

## 2021-04-02 ENCOUNTER — Other Ambulatory Visit: Payer: Self-pay

## 2021-04-02 DIAGNOSIS — D631 Anemia in chronic kidney disease: Secondary | ICD-10-CM | POA: Diagnosis not present

## 2021-04-02 DIAGNOSIS — N183 Chronic kidney disease, stage 3 unspecified: Secondary | ICD-10-CM | POA: Diagnosis not present

## 2021-04-02 DIAGNOSIS — D472 Monoclonal gammopathy: Secondary | ICD-10-CM | POA: Diagnosis not present

## 2021-04-02 DIAGNOSIS — D696 Thrombocytopenia, unspecified: Secondary | ICD-10-CM | POA: Diagnosis not present

## 2021-04-03 DIAGNOSIS — Z20822 Contact with and (suspected) exposure to covid-19: Secondary | ICD-10-CM | POA: Diagnosis not present

## 2021-04-04 ENCOUNTER — Other Ambulatory Visit: Payer: Self-pay

## 2021-04-04 ENCOUNTER — Emergency Department: Payer: Medicare Other

## 2021-04-04 ENCOUNTER — Encounter: Payer: Self-pay | Admitting: Emergency Medicine

## 2021-04-04 ENCOUNTER — Emergency Department
Admission: EM | Admit: 2021-04-04 | Discharge: 2021-04-04 | Disposition: A | Discharge status: Home / Self Care | Payer: Medicare Other | Attending: Emergency Medicine | Admitting: Emergency Medicine

## 2021-04-04 DIAGNOSIS — Z7982 Long term (current) use of aspirin: Secondary | ICD-10-CM | POA: Diagnosis not present

## 2021-04-04 DIAGNOSIS — I10 Essential (primary) hypertension: Secondary | ICD-10-CM | POA: Diagnosis not present

## 2021-04-04 DIAGNOSIS — R059 Cough, unspecified: Secondary | ICD-10-CM | POA: Diagnosis not present

## 2021-04-04 DIAGNOSIS — U071 COVID-19: Secondary | ICD-10-CM

## 2021-04-04 DIAGNOSIS — Z79899 Other long term (current) drug therapy: Secondary | ICD-10-CM | POA: Diagnosis not present

## 2021-04-04 DIAGNOSIS — R0602 Shortness of breath: Secondary | ICD-10-CM | POA: Diagnosis not present

## 2021-04-04 DIAGNOSIS — E119 Type 2 diabetes mellitus without complications: Secondary | ICD-10-CM | POA: Diagnosis not present

## 2021-04-04 DIAGNOSIS — I2510 Atherosclerotic heart disease of native coronary artery without angina pectoris: Secondary | ICD-10-CM | POA: Diagnosis not present

## 2021-04-04 DIAGNOSIS — J449 Chronic obstructive pulmonary disease, unspecified: Secondary | ICD-10-CM | POA: Diagnosis not present

## 2021-04-04 DIAGNOSIS — Z794 Long term (current) use of insulin: Secondary | ICD-10-CM | POA: Diagnosis not present

## 2021-04-04 DIAGNOSIS — Z951 Presence of aortocoronary bypass graft: Secondary | ICD-10-CM | POA: Diagnosis not present

## 2021-04-04 DIAGNOSIS — I517 Cardiomegaly: Secondary | ICD-10-CM | POA: Diagnosis not present

## 2021-04-04 LAB — CBC
HCT: 25 % — ABNORMAL LOW (ref 36.0–46.0)
Hemoglobin: 8.1 g/dL — ABNORMAL LOW (ref 12.0–15.0)
MCH: 25.8 pg — ABNORMAL LOW (ref 26.0–34.0)
MCHC: 32.4 g/dL (ref 30.0–36.0)
MCV: 79.6 fL — ABNORMAL LOW (ref 80.0–100.0)
Platelets: 63 10*3/uL — ABNORMAL LOW (ref 150–400)
RBC: 3.14 MIL/uL — ABNORMAL LOW (ref 3.87–5.11)
RDW: 16.8 % — ABNORMAL HIGH (ref 11.5–15.5)
WBC: 9.1 10*3/uL (ref 4.0–10.5)
nRBC: 0 % (ref 0.0–0.2)

## 2021-04-04 LAB — BASIC METABOLIC PANEL
Anion gap: 9 (ref 5–15)
BUN: 53 mg/dL — ABNORMAL HIGH (ref 8–23)
CO2: 23 mmol/L (ref 22–32)
Calcium: 8.6 mg/dL — ABNORMAL LOW (ref 8.9–10.3)
Chloride: 103 mmol/L (ref 98–111)
Creatinine, Ser: 2.03 mg/dL — ABNORMAL HIGH (ref 0.44–1.00)
GFR, Estimated: 23 mL/min — ABNORMAL LOW (ref >60)
Glucose, Bld: 135 mg/dL — ABNORMAL HIGH (ref 70–99)
Potassium: 5.3 mmol/L — ABNORMAL HIGH (ref 3.5–5.1)
Sodium: 135 mmol/L (ref 135–145)

## 2021-04-04 NOTE — ED Triage Notes (Signed)
Pt via POV from home. Pt c/o SOB and cough for the past couple of days. Tested positive for COVID this AM. Denies pain. Pt is A&Ox4 and NAD.

## 2021-04-04 NOTE — ED Provider Notes (Signed)
[facility] Emergency Department Provider Note   ____________________________________________    I have reviewed the triage vital signs and the nursing notes.   HISTORY  Chief Complaint Shortness of Breath     HPI Gabriela [name] is a 87 y.o. female with a history of diabetes and additional history as detailed below who presents with complaints of cough and fatigue.  Patient recently diagnosed with COVID-19, reportedly sent to the emergency department for chest x-ray to make sure she does not have COVID-pneumonia.  Patient reports she feels okay but primarily tired.  She denies shortness of breath at this time.  Past Medical History:  Diagnosis Date   Arthritis    Diabetes mellitus ([place])    Glaucoma    Hyperlipidemia    Hypertension    Shortness of breath    with exertion    Patient Active Problem List   Diagnosis Date Noted   Encounter for adjustment or removal of myringotomy device (stent) (tube)    Choledocholithiasis    Common bile duct dilatation    Abnormal findings on imaging of biliary tract    Elevated LFTs 10/18/2020   Wound of thigh 03/30/2018   History of revision of total replacement of right hip joint 01/20/2016   Abnormal LFTs (liver function tests) 04/17/2015   Anemia of diabetes 04/17/2015   Atherosclerosis of coronary artery 04/17/2015   Diabetic kidney ([place]) 04/17/2015   Essential (primary) hypertension 04/17/2015   Glaucoma 04/17/2015   HLD (hyperlipidemia) 04/17/2015   Disorder of kidney 04/17/2015   Arthritis, degenerative 04/17/2015   Arthritis, hip 04/26/2013   Left main coronary artery disease 04/26/2013   Ischemic mitral regurgitation 04/26/2013   Hypertension    Diabetes mellitus ([place])    Hyperlipidemia     Past Surgical History:  Procedure Laterality Date   CARDIAC CATHETERIZATION  04/22/13   CHOLECYSTECTOMY     CORONARY ARTERY BYPASS GRAFT N/A 05/31/2013   Procedure: Coronary Artery Bypass Grafting  Times Three Using Left Internal Mammary Artery and Right Saphenous Leg Vein Harvested Endoscopically;  Surgeon: Ivin Poot, MD;  Location: [place];  Service: Open Heart Surgery;  Laterality: N/A;   ERCP N/A 10/20/2020   Procedure: ENDOSCOPIC RETROGRADE CHOLANGIOPANCREATOGRAPHY (ERCP);  Surgeon: Lucilla Lame, MD;  Location: [facility] ENDOSCOPY;  Service: Endoscopy;  Laterality: N/A;   ERCP N/A 12/22/2020   Procedure: ENDOSCOPIC RETROGRADE CHOLANGIOPANCREATOGRAPHY (ERCP);  Surgeon: Lucilla Lame, MD;  Location: [facility] ENDOSCOPY;  Service: Endoscopy;  Laterality: N/A;   EYE SURGERY     HIP SURGERY     right   INTRAOPERATIVE TRANSESOPHAGEAL ECHOCARDIOGRAM N/A 05/31/2013   Procedure: INTRAOPERATIVE TRANSESOPHAGEAL ECHOCARDIOGRAM;  Surgeon: Ivin Poot, MD;  Location: [place];  Service: Open Heart Surgery;  Laterality: N/A;   JOINT REPLACEMENT     MITRAL VALVE REPAIR N/A 05/31/2013   Procedure: MITRAL VALVE REPAIR (MVR);  Surgeon: Ivin Poot, MD;  Location: [place];  Service: Open Heart Surgery;  Laterality: N/A;   TONSILLECTOMY      Prior to Admission medications   Medication Sig Start Date End Date Taking? Authorizing Provider  Accu-Chek FastClix Lancets MISC To check blood sugars once a day. DX E11.9 03/09/21   Jerrol Banana., MD  aspirin EC 81 MG tablet Take 81 mg by mouth daily.    [provider]  Blood Glucose Monitoring Suppl (ACCU-CHEK AVIVA PLUS) w/Device KIT 1 each by Does not apply route daily. Dx E11.9 03/09/21   Eulas Post  Brooke Bonito., MD  ferrous sulfate 325 (65 FE) MG tablet TAKE 1 TABLET BY MOUTH EVERY OTHER DAY 08/03/20   Jerrol Banana., MD  furosemide (LASIX) 20 MG tablet TAKE 1 TABLET BY MOUTH EVERY DAY 02/08/21   Virginia Crews, MD  glucose blood (ACCU-CHEK AVIVA PLUS) test strip Use as instructed DX E11.9 03/09/21   Jerrol Banana., MD  hydrochlorothiazide (HYDRODIURIL) 25 MG tablet Take 1 tablet (25 mg total) by mouth daily. 10/06/20   Jerrol Banana., MD  Insulin Pen Needle (B-D UF III MINI PEN NEEDLES) 31G X 5 MM MISC USE WITH INSULIN DOSES 08/18/20   Jerrol Banana., MD  LEVEMIR FLEXTOUCH 100 UNIT/ML FlexPen INJECT 15 UNITS INTO THE SKIN DAILY. IN THE AFTERNOON 08/03/20   Jerrol Banana., MD  metoprolol tartrate (LOPRESSOR) 25 MG tablet TAKE 1/2 TABLET BY MOUTH EVERY DAY 01/10/21   Jerrol Banana., MD  ondansetron (ZOFRAN ODT) 4 MG disintegrating tablet Take 1 tablet (4 mg total) by mouth every 8 (eight) hours as needed. 12/08/20   Menshew, Dannielle Karvonen, PA-C  timolol (TIMOPTIC) 0.5 % ophthalmic solution Place 1 drop into both eyes every morning. 01/03/19   [provider]     Allergies Sulfa antibiotics  Family History  Problem Relation Age of Onset   Diabetes Sister    Hypertension Mother     Social History Social History   Tobacco Use   Smoking status: Never   Smokeless tobacco: Never  Vaping Use   Vaping Use: Never used  Substance Use Topics   Alcohol use: No   Drug use: No    Review of Systems  Constitutional: Occasional chills Eyes: No visual changes.  ENT: No sore throat. Cardiovascular: Denies chest pain. Respiratory: As above Gastrointestinal: No abdominal pain.  No nausea, no vomiting.   Genitourinary: Negative for dysuria. Musculoskeletal: Some body aches Skin: Negative for rash. Neurological: Negative for headaches or weakness   ____________________________________________   PHYSICAL EXAM:  VITAL SIGNS: ED Triage Vitals  Enc Vitals Group     BP 04/04/21 1500 (!) 139/59     Pulse Rate 04/04/21 1500 70     Resp 04/04/21 1500 20     Temp 04/04/21 1500 98.9 F (37.2 C)     Temp Source 04/04/21 1500 Oral     SpO2 04/04/21 1500 99 %     Weight 04/04/21 1501 57.2 kg (126 lb)     Height 04/04/21 1501 1.6 m ('5\' 3"' )     Head Circumference --      Peak Flow --      Pain Score 04/04/21 1501 0     Pain Loc --      Pain Edu? --      Excl. in [place]? --      Constitutional: Alert and oriented.  Eyes: Conjunctivae are normal.   Nose: No congestion/rhinnorhea. Mouth/Throat: Mucous membranes are moist.    Cardiovascular: Normal rate, regular rhythm. Grossly normal heart sounds.  Good peripheral circulation. Respiratory: Normal respiratory effort.  No retractions. Lungs CTAB. Gastrointestinal: Soft and nontender. No distention.    Musculoskeletal: No lower extremity tenderness nor edema.  Warm and well perfused Neurologic:  Normal speech and language. No gross focal neurologic deficits are appreciated.  Skin:  Skin is warm, dry and intact. No rash noted. Psychiatric: Mood and affect are normal. Speech and behavior are normal.  ____________________________________________   LABS (all labs ordered are listed, but  only abnormal results are displayed)  Labs Reviewed  BASIC METABOLIC PANEL - Abnormal; Notable for the following components:      Result Value   Potassium 5.3 (*)    Glucose, Bld 135 (*)    BUN 53 (*)    Creatinine, Ser 2.03 (*)    Calcium 8.6 (*)    GFR, Estimated 23 (*)    All other components within normal limits  CBC - Abnormal; Notable for the following components:   RBC 3.14 (*)    Hemoglobin 8.1 (*)    HCT 25.0 (*)    MCV 79.6 (*)    MCH 25.8 (*)    RDW 16.8 (*)    Platelets 63 (*)    All other components within normal limits   ____________________________________________  EKG  ED ECG REPORT I, Lavonia Drafts, the attending physician, personally viewed and interpreted this ECG.  Date: 04/04/2021  Rhythm: normal sinus rhythm QRS Axis: normal Intervals: normal ST/T Wave abnormalities: normal Narrative Interpretation: Occasional PAC  ____________________________________________  RADIOLOGY  Chest x-ray reviewed by me, no evidence of pneumonia ____________________________________________   PROCEDURES  Procedure(s) performed: No  Procedures   Critical Care performed:  No ____________________________________________   INITIAL IMPRESSION / ASSESSMENT AND PLAN / ED COURSE  Pertinent labs & imaging results that were available during my care of the patient were reviewed by me and considered in my medical decision making (see chart for details).   Patient overall well-appearing and in no acute distress, lab work today is not significantly changed from prior, slightly increased BUN/creatinine from prior, encourage p.o. hydration and recheck as an outpatient.  Chest x-ray is reassuring, hemoglobin is consistent with prior levels.  Apparently patient's primary reason for coming today was to obtain chest x-ray,  Given her renal function Paxlovid is not recommended  No indication for admission at this time, outpatient follow-up recommended    ____________________________________________   FINAL CLINICAL IMPRESSION(S) / ED DIAGNOSES  Final diagnoses:  Shortness of breath  COVID-19        Note:  This document was prepared using Dragon voice recognition software and may include unintentional dictation errors.    Lavonia Drafts, MD 04/04/21 2034

## 2021-04-05 ENCOUNTER — Ambulatory Visit: Payer: Self-pay | Admitting: *Deleted

## 2021-04-05 ENCOUNTER — Other Ambulatory Visit: Payer: Self-pay | Admitting: *Deleted

## 2021-04-05 DIAGNOSIS — U071 COVID-19: Secondary | ICD-10-CM

## 2021-04-05 DIAGNOSIS — R059 Cough, unspecified: Secondary | ICD-10-CM

## 2021-04-05 LAB — IFE AND PE, RANDOM URINE
% BETA, Urine: 0 %
ALPHA 1 URINE: 0 %
Albumin, U: 100 %
Alpha 2, Urine: 0 %
GAMMA GLOBULIN URINE: 0 %
Total Protein, Urine: 16.9 mg/dL

## 2021-04-05 MED ORDER — ALBUTEROL SULFATE HFA 108 (90 BASE) MCG/ACT IN AERS: 1.0000 | INHALATION_SPRAY | RESPIRATORY_TRACT | 1 refills | Status: DC | PRN

## 2021-04-05 NOTE — Telephone Encounter (Signed)
Returned call to [employer]. Advised albuterol. Also, advised infusion clinic. Gabriela [name] is agreeable to inhaler and infusion clinic. Orders placed for both.

## 2021-04-05 NOTE — Telephone Encounter (Signed)
Pts granddaughter, Fabiola Backer, called stating that the pt tested positive for covid. She states that the pt was taken to the ED to make sure there was no pneumonia. She is requesting to have info about medications pt can take to help with cough during the night. Pt is experiencing runny nose and spitting up. Please advise.  Reason for Disposition  [1] HIGH RISK for severe COVID complications (e.g., weak immune system, age > 42 years, obesity with BMI > 25, pregnant, chronic lung disease or other chronic medical condition) AND [2] COVID symptoms (e.g., cough, fever)  (Exceptions: Already seen by PCP and no new or worsening symptoms.)  Answer Assessment - Initial Assessment Questions 1. COVID-19 DIAGNOSIS: "Who made your COVID-19 diagnosis?" "Was it confirmed by a positive lab test or self-test?" If not diagnosed by a doctor (or NP/PA), ask "Are there lots of cases (community spread) where you live?" Note: See public health department website, if unsure.     + COVID home test- Saturday or Sunday 2. COVID-19 EXPOSURE: "Was there any known exposure to COVID before the symptoms began?" CDC Definition of close contact: within 6 feet (2 meters) for a total of 15 minutes or more over a 24-hour period.      Exposure from family member 22. ONSET: "When did the COVID-19 symptoms start?"      Saturday/Sunday 4. WORST SYMPTOM: "What is your worst symptom?" (e.g., cough, fever, shortness of breath, muscle aches)     cough 5. COUGH: "Do you have a cough?" If Yes, ask: "How bad is the cough?"       Cough- patient coughed all night 6. FEVER: "Do you have a fever?" If Yes, ask: "What is your temperature, how was it measured, and when did it start?"     no 7. RESPIRATORY STATUS: "Describe your breathing?" (e.g., shortness of breath, wheezing, unable to speak)      Wheezing a little bit- reason for ED visit 8. BETTER-SAME-WORSE: "Are you getting better, staying the same or getting worse compared to yesterday?"   If getting worse, ask, "In what way?"     Same- no new symptom 9. HIGH RISK DISEASE: "Do you have any chronic medical problems?" (e.g., asthma, heart or lung disease, weak immune system, obesity, etc.)     Yes-age, diabetes, high BP- heart diease 10. VACCINE: "Have you had the COVID-19 vaccine?" If Yes, ask: "Which one, how many shots, when did you get it?"       Yes 11. BOOSTER: "Have you received your COVID-19 booster?" If Yes, ask: "Which one and when did you get it?"       yes 12. PREGNANCY: "Is there any chance you are pregnant?" "When was your last menstrual period?"       N/a 13. OTHER SYMPTOMS: "Do you have any other symptoms?"  (e.g., chills, fatigue, headache, loss of smell or taste, muscle pain, sore throat)       weakness 14. O2 SATURATION MONITOR:  "Do you use an oxygen saturation monitor (pulse oximeter) at home?" If Yes, ask "What is your reading (oxygen level) today?" "What is your usual oxygen saturation reading?" (e.g., 95%)       no  Protocols used: Coronavirus (COVID-19) Diagnosed or Suspected-A-AH

## 2021-04-05 NOTE — Telephone Encounter (Signed)
Please advise 

## 2021-04-05 NOTE — Telephone Encounter (Signed)
Patient's granddaughter is calling to report patient has tested + COVID home- she was taken to ED for some wheezing and cough symptoms to have chest X ray yesterday. Patient had home exposure to COVID and she tested + after symptoms started Sat/Sun. Presently patient is being treated with OTC: vitamin C, Elderberry/honey,[facility] cough/cold HBP (chlorpheniramine), patient has continued to eat and is trying to stay hydrated. Family does have questions about : other cough treatments- to help patient at night and if patient would qualify for any antiviral treatment. Advised I would send message to PCP for review and recommendations. Granddaughter advised COVID protocols treatment/isolation. Please contact : [name] with response

## 2021-04-06 ENCOUNTER — Ambulatory Visit: Payer: Medicare Other

## 2021-04-06 ENCOUNTER — Telehealth: Payer: Self-pay

## 2021-04-06 ENCOUNTER — Other Ambulatory Visit: Payer: Self-pay | Admitting: Family Medicine

## 2021-04-06 DIAGNOSIS — I10 Essential (primary) hypertension: Secondary | ICD-10-CM

## 2021-04-06 NOTE — Telephone Encounter (Signed)
Copied from [place] [id number]. Topic: General - Other >> Apr 06, 2021  9:22 AM Celene Kras wrote: Reason for CRM: [name], pts granddaughter, calling on behalf of pt. She states that they are not able to get pt to the infusion center in [place] due to her having covid and is requesting to have other options. Please advise .

## 2021-04-06 NOTE — Telephone Encounter (Signed)
Please advise if there are other options?

## 2021-04-07 NOTE — Telephone Encounter (Signed)
Gabriela [name] was advised.

## 2021-04-09 ENCOUNTER — Telehealth: Payer: Self-pay | Admitting: Oncology

## 2021-04-09 NOTE — Telephone Encounter (Signed)
Patient's granddaughter called to report that patient has COVID (tested pos on 8/21). She would like to r/s her appointment on 8/29. Appointment moved to 9/12.   Routing to clinical team to make aware.

## 2021-04-12 ENCOUNTER — Inpatient Hospital Stay: Payer: Medicare Other | Admitting: Oncology

## 2021-04-26 ENCOUNTER — Other Ambulatory Visit: Payer: Self-pay

## 2021-04-26 ENCOUNTER — Inpatient Hospital Stay: Payer: Medicare Other | Attending: Oncology | Admitting: Oncology

## 2021-04-26 ENCOUNTER — Encounter: Payer: Self-pay | Admitting: Oncology

## 2021-04-26 ENCOUNTER — Inpatient Hospital Stay: Payer: Medicare Other | Admitting: Oncology

## 2021-04-26 DIAGNOSIS — N183 Chronic kidney disease, stage 3 unspecified: Secondary | ICD-10-CM | POA: Diagnosis not present

## 2021-04-26 DIAGNOSIS — Z79899 Other long term (current) drug therapy: Secondary | ICD-10-CM | POA: Insufficient documentation

## 2021-04-26 DIAGNOSIS — Z7982 Long term (current) use of aspirin: Secondary | ICD-10-CM | POA: Insufficient documentation

## 2021-04-26 DIAGNOSIS — D472 Monoclonal gammopathy: Secondary | ICD-10-CM | POA: Diagnosis not present

## 2021-04-26 DIAGNOSIS — D631 Anemia in chronic kidney disease: Secondary | ICD-10-CM | POA: Insufficient documentation

## 2021-04-26 DIAGNOSIS — D696 Thrombocytopenia, unspecified: Secondary | ICD-10-CM | POA: Insufficient documentation

## 2021-04-26 DIAGNOSIS — I129 Hypertensive chronic kidney disease with stage 1 through stage 4 chronic kidney disease, or unspecified chronic kidney disease: Secondary | ICD-10-CM | POA: Insufficient documentation

## 2021-04-26 DIAGNOSIS — Z794 Long term (current) use of insulin: Secondary | ICD-10-CM | POA: Insufficient documentation

## 2021-04-26 NOTE — Progress Notes (Signed)
HEMATOLOGY-ONCOLOGY TeleHEALTH VISIT PROGRESS NOTE  I connected with Gabriela [name] on 04/26/21  at  1:45 PM EDT by video enabled telemedicine visit and verified that I am speaking with the correct person using two identifiers. I discussed the limitations, risks, security and privacy concerns of performing an evaluation and management service by telemedicine and the availability of in-person appointments. The patient expressed understanding and agreed to proceed.   Other persons participating in the visit and their role in the encounter:  None  Patient's location: Home  Provider's location: office Chief Complaint: MGUS and anemia   INTERVAL HISTORY Gabriela [name] is a 87 y.o. female who has above history reviewed by me today presents for follow up visit for management of anemia and MGUS Problems and complaints are listed below:  Patient's family called and switched her visit to virtual visit today.  Patient recently recovered from [place] 19 infection.  She had blood work done on 04/02/2021 Presented to ER on 04/04/21 for evaluation of fatigue and tiredness.  And repeated blood work.  X-ray is negative for acute pneumonia. Today patient reports feeling much better.  Chronic fatigue unchanged.  Review of Systems  Constitutional:  Positive for fatigue. Negative for appetite change, chills and fever.  HENT:   Negative for hearing loss and voice change.   Eyes:  Negative for eye problems.  Respiratory:  Negative for chest tightness and cough.   Cardiovascular:  Negative for chest pain.  Gastrointestinal:  Negative for abdominal distention, abdominal pain and blood in stool.  Endocrine: Negative for hot flashes.  Genitourinary:  Negative for difficulty urinating and frequency.   Musculoskeletal:  Negative for arthralgias.  Skin:  Negative for itching and rash.  Neurological:  Negative for extremity weakness.  Hematological:  Negative for adenopathy.  Psychiatric/Behavioral:  Negative for  confusion.    Past Medical History:  Diagnosis Date   Arthritis    Diabetes mellitus ([place])    Glaucoma    Hyperlipidemia    Hypertension    Shortness of breath    with exertion   Past Surgical History:  Procedure Laterality Date   CARDIAC CATHETERIZATION  04/22/13   CHOLECYSTECTOMY     CORONARY ARTERY BYPASS GRAFT N/A 05/31/2013   Procedure: Coronary Artery Bypass Grafting Times Three Using Left Internal Mammary Artery and Right Saphenous Leg Vein Harvested Endoscopically;  Surgeon: Ivin Poot, MD;  Location: [place];  Service: Open Heart Surgery;  Laterality: N/A;   ERCP N/A 10/20/2020   Procedure: ENDOSCOPIC RETROGRADE CHOLANGIOPANCREATOGRAPHY (ERCP);  Surgeon: Lucilla Lame, MD;  Location: [facility] ENDOSCOPY;  Service: Endoscopy;  Laterality: N/A;   ERCP N/A 12/22/2020   Procedure: ENDOSCOPIC RETROGRADE CHOLANGIOPANCREATOGRAPHY (ERCP);  Surgeon: Lucilla Lame, MD;  Location: [facility] ENDOSCOPY;  Service: Endoscopy;  Laterality: N/A;   EYE SURGERY     HIP SURGERY     right   INTRAOPERATIVE TRANSESOPHAGEAL ECHOCARDIOGRAM N/A 05/31/2013   Procedure: INTRAOPERATIVE TRANSESOPHAGEAL ECHOCARDIOGRAM;  Surgeon: Ivin Poot, MD;  Location: [place];  Service: Open Heart Surgery;  Laterality: N/A;   JOINT REPLACEMENT     MITRAL VALVE REPAIR N/A 05/31/2013   Procedure: MITRAL VALVE REPAIR (MVR);  Surgeon: Ivin Poot, MD;  Location: [place];  Service: Open Heart Surgery;  Laterality: N/A;   TONSILLECTOMY      Family History  Problem Relation Age of Onset   Diabetes Sister    Hypertension Mother     Social History   Socioeconomic History   Marital status: Widowed  Spouse name: Not on file   Number of children: 7   Years of education: Not on file   Highest education level: 11th grade  Occupational History   Occupation: retired  Tobacco Use   Smoking status: Never   Smokeless tobacco: Never  Vaping Use   Vaping Use: Never used  Substance and Sexual Activity   Alcohol use: No   Drug  use: No   Sexual activity: Never  Other Topics Concern   Not on file  Social History Narrative   Not on file   Social Determinants of Health   Financial Resource Strain: Not on file  Food Insecurity: Not on file  Transportation Needs: Not on file  Physical Activity: Not on file  Stress: Not on file  Social Connections: Not on file  Intimate Partner Violence: Not on file    Current Outpatient Medications on File Prior to Visit  Medication Sig Dispense Refill   Accu-Chek FastClix Lancets MISC To check blood sugars once a day. DX E11.9 102 each 12   albuterol (VENTOLIN HFA) 108 (90 Base) MCG/ACT inhaler Inhale 1-2 puffs into the lungs every 4 (four) hours as needed for wheezing or shortness of breath. 18 g 1   aspirin EC 81 MG tablet Take 81 mg by mouth daily.     Blood Glucose Monitoring Suppl (ACCU-CHEK AVIVA PLUS) w/Device KIT 1 each by Does not apply route daily. Dx E11.9 1 kit 0   ferrous sulfate 325 (65 FE) MG tablet TAKE 1 TABLET BY MOUTH EVERY OTHER DAY 45 tablet 2   furosemide (LASIX) 20 MG tablet TAKE 1 TABLET BY MOUTH EVERY DAY 90 tablet 0   glucose blood (ACCU-CHEK AVIVA PLUS) test strip Use as instructed DX E11.9 100 strip 1   hydrochlorothiazide (HYDRODIURIL) 25 MG tablet Take 1 tablet (25 mg total) by mouth daily. 90 tablet 1   Insulin Pen Needle (B-D UF III MINI PEN NEEDLES) 31G X 5 MM MISC USE WITH INSULIN DOSES 100 each 5   LEVEMIR FLEXTOUCH 100 UNIT/ML FlexPen INJECT 15 UNITS INTO THE SKIN DAILY. IN THE AFTERNOON 15 mL 5   metoprolol tartrate (LOPRESSOR) 25 MG tablet TAKE 1/2 TABLET BY MOUTH EVERY DAY 45 tablet 0   ondansetron (ZOFRAN ODT) 4 MG disintegrating tablet Take 1 tablet (4 mg total) by mouth every 8 (eight) hours as needed. 15 tablet 0   timolol (TIMOPTIC) 0.5 % ophthalmic solution Place 1 drop into both eyes every morning.     No current facility-administered medications on file prior to visit.    Allergies  Allergen Reactions   Sulfa Antibiotics Rash        Observations/Objective: There were no vitals filed for this visit. There is no height or weight on file to calculate BMI.  Physical Exam Neurological:     Mental Status: She is alert.    CBC    Component Value Date/Time   WBC 9.1 04/04/2021 1534   RBC 3.14 (L) 04/04/2021 1534   HGB 8.1 (L) 04/04/2021 1534   HGB 9.0 (L) 10/28/2020 1411   HCT 25.0 (L) 04/04/2021 1534   HCT 27.7 (L) 10/28/2020 1411   PLT 63 (L) 04/04/2021 1534   PLT 147 (L) 10/28/2020 1411   MCV 79.6 (L) 04/04/2021 1534   MCV 80 10/28/2020 1411   MCH 25.8 (L) 04/04/2021 1534   MCHC 32.4 04/04/2021 1534   RDW 16.8 (H) 04/04/2021 1534   RDW 16.4 (H) 10/28/2020 1411   LYMPHSABS 2.6 04/01/2021 1133  LYMPHSABS 2.5 10/28/2020 1411   MONOABS 0.7 04/01/2021 1133   EOSABS 0.0 04/01/2021 1133   EOSABS 0.0 10/28/2020 1411   BASOSABS 0.0 04/01/2021 1133   BASOSABS 0.0 10/28/2020 1411    CMP     Component Value Date/Time   NA 135 04/04/2021 1534   NA 139 10/28/2020 1411   K 5.3 (H) 04/04/2021 1534   CL 103 04/04/2021 1534   CO2 23 04/04/2021 1534   GLUCOSE 135 (H) 04/04/2021 1534   BUN 53 (H) 04/04/2021 1534   BUN 27 10/28/2020 1411   CREATININE 2.03 (H) 04/04/2021 1534   CREATININE 1.33 (H) 06/13/2017 1156   CALCIUM 8.6 (L) 04/04/2021 1534   PROT 9.2 (H) 04/01/2021 1133   PROT 8.2 10/28/2020 1411   ALBUMIN 4.1 04/01/2021 1133   ALBUMIN 3.9 10/28/2020 1411   AST 18 04/01/2021 1133   ALT 9 04/01/2021 1133   ALKPHOS 95 04/01/2021 1133   BILITOT 0.8 04/01/2021 1133   BILITOT 1.1 10/28/2020 1411   GFRNONAA 23 (L) 04/04/2021 1534   GFRNONAA 35 (L) 06/13/2017 1156   GFRAA 30 (L) 07/28/2020 1122   GFRAA 41 (L) 06/13/2017 1156     Assessment and Plan: 1. Anemia of chronic renal failure, stage 3 (moderate), unspecified whether stage 3a or 3b CKD ([place])   2. MGUS (monoclonal gammopathy of unknown significance)   3. Thrombocytopenia (H[facility])     MGUS, Continue observation.  We will check multiple  myeloma and light chain ratio the next visit.  Urine protein-electrophoresis is negative.  Anemia due to chronic kidney disease.  Previously discussed about EPO and the patient/family declined. Hemoglobin slightly decreased, likely secondary to acute COVID-19 infection. Blood work done shortly prior to her presentation to ER showed stable hemoglobin.  Continue monitor  Thrombocytopenia, platelet count is slightly lower than her baseline when she presented to ER.  Blood work done prior to her presentation showed close to her baseline.  Monitor.  Follow Up Instructions: 6 months   I discussed the assessment and treatment plan with the patient. The patient was provided an opportunity to ask questions and all were answered. The patient agreed with the plan and demonstrated an understanding of the instructions.  The patient was advised to call back or seek an in-person evaluation if the symptoms worsen or if the condition fails to improve as anticipated.  Earlie Server, MD 04/26/2021 8:43 PM

## 2021-05-08 ENCOUNTER — Other Ambulatory Visit: Payer: Self-pay | Admitting: Family Medicine

## 2021-05-13 ENCOUNTER — Ambulatory Visit: Payer: Self-pay | Admitting: Family Medicine

## 2021-05-17 ENCOUNTER — Other Ambulatory Visit: Payer: Self-pay | Admitting: Family Medicine

## 2021-05-17 DIAGNOSIS — I10 Essential (primary) hypertension: Secondary | ICD-10-CM

## 2021-06-28 ENCOUNTER — Encounter: Payer: Self-pay | Admitting: Emergency Medicine

## 2021-06-28 ENCOUNTER — Other Ambulatory Visit: Payer: Self-pay

## 2021-06-28 ENCOUNTER — Ambulatory Visit
Admission: EM | Admit: 2021-06-28 | Discharge: 2021-06-28 | Disposition: A | Discharge status: Home / Self Care | Payer: Medicare Other | Attending: Emergency Medicine | Admitting: Emergency Medicine

## 2021-06-28 DIAGNOSIS — R051 Acute cough: Secondary | ICD-10-CM

## 2021-06-28 DIAGNOSIS — J0100 Acute maxillary sinusitis, unspecified: Secondary | ICD-10-CM

## 2021-06-28 LAB — POCT INFLUENZA A/B
Influenza A, POC: NEGATIVE
Influenza B, POC: NEGATIVE

## 2021-06-28 MED ORDER — AMOXICILLIN 875 MG PO TABS: 875.0000 mg | ORAL_TABLET | Freq: Two times a day (BID) | ORAL | 0 refills | Status: AC

## 2021-06-28 MED ORDER — BENZONATATE 100 MG PO CAPS: 100.0000 mg | ORAL_CAPSULE | Freq: Three times a day (TID) | ORAL | 0 refills | Status: DC | PRN

## 2021-06-28 NOTE — ED Triage Notes (Signed)
Pt has had a cough x 1 week. At home covid test was negative yesterday.

## 2021-06-28 NOTE — ED Provider Notes (Signed)
Roderic Palau    CSN: [id number] Arrival date & time: 06/28/21  1357      History   Chief Complaint Chief Complaint  Patient presents with   Cough    HPI Gabriela [name] is a 87 y.o. female.  Accompanied by her great granddaughter, patient presents with 1 week history of congestion, cough productive of yellow sputum, diarrhea.  Treatment with OTC cough medication.  No fever, rash, shortness of breath, vomiting, or other symptoms.  Negative at home COVID test yesterday.  Her medical history includes hypertension, diabetes, abnormal LFTs, kidney disease.  The history is provided by the patient, a relative and medical records.   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   Abnormal LFTs (liver function tests) 04/17/2015   Anemia of diabetes 04/17/2015   Atherosclerosis of coronary artery 04/17/2015   Diabetic kidney ([place]) 04/17/2015   Essential (primary) hypertension 04/17/2015   Glaucoma 04/17/2015   HLD (hyperlipidemia) 04/17/2015   Disorder of kidney 04/17/2015   Arthritis, degenerative 04/17/2015   Arthritis, hip 04/26/2013   Left main coronary artery disease 04/26/2013   Ischemic mitral regurgitation 04/26/2013   Hypertension    Diabetes mellitus ([place])    Hyperlipidemia     Past Surgical History:  Procedure Laterality Date   CARDIAC CATHETERIZATION  04/22/13   CHOLECYSTECTOMY     CORONARY ARTERY BYPASS GRAFT N/A 05/31/2013   Procedure: Coronary Artery Bypass Grafting Times  Three Using Left Internal Mammary Artery and Right Saphenous Leg Vein Harvested Endoscopically;  Surgeon: Ivin Poot, MD;  Location: [place];  Service: Open Heart Surgery;  Laterality: N/A;   ERCP N/A 10/20/2020   Procedure: ENDOSCOPIC RETROGRADE CHOLANGIOPANCREATOGRAPHY (ERCP);  Surgeon: Lucilla Lame, MD;  Location: [facility] ENDOSCOPY;  Service: Endoscopy;  Laterality: N/A;   ERCP N/A 12/22/2020   Procedure: ENDOSCOPIC RETROGRADE CHOLANGIOPANCREATOGRAPHY (ERCP);  Surgeon: Lucilla Lame, MD;  Location: [facility] ENDOSCOPY;  Service: Endoscopy;  Laterality: N/A;   EYE SURGERY     HIP SURGERY     right   INTRAOPERATIVE TRANSESOPHAGEAL ECHOCARDIOGRAM N/A 05/31/2013   Procedure: INTRAOPERATIVE TRANSESOPHAGEAL ECHOCARDIOGRAM;  Surgeon: Ivin Poot, MD;  Location: [place];  Service: Open Heart Surgery;  Laterality: N/A;   JOINT REPLACEMENT     MITRAL VALVE REPAIR N/A 05/31/2013   Procedure: MITRAL VALVE REPAIR (MVR);  Surgeon: Ivin Poot, MD;  Location: [place];  Service: Open Heart Surgery;  Laterality: N/A;   TONSILLECTOMY      OB History   No obstetric history on file.      Home Medications    Prior to Admission medications   Medication Sig Start Date End Date Taking? Authorizing Provider  albuterol (VENTOLIN HFA) 108 (90 Base) MCG/ACT inhaler Inhale 1-2 puffs into the lungs every 4 (four) hours as needed for wheezing or shortness of breath. 04/05/21  Yes Jerrol Banana., MD  amoxicillin (AMOXIL) 875 MG tablet  Take 1 tablet (875 mg total) by mouth 2 (two) times daily for 7 days. 06/28/21 07/05/21 Yes Sharion Balloon, NP  aspirin EC 81 MG tablet Take 81 mg by mouth daily.   Yes [provider]  benzonatate (TESSALON) 100 MG capsule Take 1 capsule (100 mg total) by mouth 3 (three) times daily as needed for cough. 06/28/21  Yes Sharion Balloon, NP  ferrous sulfate 325 (65 FE) MG tablet TAKE 1 TABLET BY MOUTH EVERY OTHER DAY 08/03/20  Yes Jerrol Banana., MD  furosemide (LASIX)  20 MG tablet TAKE 1 TABLET BY MOUTH EVERY DAY 05/08/21  Yes Jerrol Banana., MD  hydrochlorothiazide (HYDRODIURIL) 25 MG tablet TAKE 1 TABLET (25 MG TOTAL) BY MOUTH DAILY. 05/17/21  Yes Jerrol Banana., MD  LEVEMIR FLEXTOUCH 100 UNIT/ML FlexPen INJECT 15 UNITS INTO THE SKIN DAILY. IN THE AFTERNOON 08/03/20  Yes Jerrol Banana., MD  metoprolol tartrate (LOPRESSOR) 25 MG tablet TAKE 1/2 TABLET BY MOUTH EVERY DAY 05/17/21  Yes Jerrol Banana., MD  ondansetron (ZOFRAN ODT) 4 MG disintegrating tablet Take 1 tablet (4 mg total) by mouth every 8 (eight) hours as needed. 12/08/20  Yes Menshew, Dannielle Karvonen, PA-C  timolol (TIMOPTIC) 0.5 % ophthalmic solution Place 1 drop into both eyes every morning. 01/03/19  Yes [provider]  Accu-Chek FastClix Lancets MISC To check blood sugars once a day. DX E11.9 03/09/21   Jerrol Banana., MD  Blood Glucose Monitoring Suppl (ACCU-CHEK AVIVA PLUS) w/Device KIT 1 each by Does not apply route daily. Dx E11.9 03/09/21   Jerrol Banana., MD  glucose blood (ACCU-CHEK AVIVA PLUS) test strip Use as instructed DX E11.9 03/09/21   Jerrol Banana., MD  Insulin Pen Needle (B-D UF III MINI PEN NEEDLES) 31G X 5 MM MISC USE WITH INSULIN DOSES 08/18/20   Jerrol Banana., MD    Family History Family History  Problem Relation Age of Onset   Diabetes Sister    Hypertension Mother     Social History Social History   Tobacco Use   Smoking status: Never   Smokeless tobacco: Never  Vaping Use   Vaping Use: Never used  Substance Use Topics   Alcohol use: No   Drug use: No     Allergies   Sulfa antibiotics   Review of Systems Review of Systems  Constitutional:  Negative for chills and fever.  HENT:  Positive for congestion. Negative for ear pain and sore throat.   Respiratory:  Positive for cough. Negative for shortness of breath.   Cardiovascular:  Negative for chest pain and palpitations.  Gastrointestinal:   Positive for diarrhea. Negative for abdominal pain and vomiting.  Skin:  Negative for color change and rash.  All other systems reviewed and are negative.   Physical Exam Triage Vital Signs ED Triage Vitals [06/28/21 1410]  Enc Vitals Group     BP (!) 136/58     Pulse Rate 65     Resp 18     Temp 98.2 F (36.8 C)     Temp Source Oral     SpO2 98 %     Weight      Height      Head Circumference      Peak Flow      Pain Score      Pain Loc      Pain Edu?  Excl. in [place]?    No data found.  Updated Vital Signs BP (!) 136/58 (BP Location: Left Arm)   Pulse 65   Temp 98.2 F (36.8 C) (Oral)   Resp 18   SpO2 98%   Visual Acuity Right Eye Distance:   Left Eye Distance:   Bilateral Distance:    Right Eye Near:   Left Eye Near:    Bilateral Near:     Physical Exam Vitals and nursing note reviewed.  Constitutional:      General: She is not in acute distress.    Appearance: She is well-developed. She is not ill-appearing.  HENT:     Head: Normocephalic and atraumatic.     Right Ear: Tympanic membrane normal.     Left Ear: Tympanic membrane normal.     Nose: Nose normal.     Mouth/Throat:     Mouth: Mucous membranes are moist.     Pharynx: Oropharynx is clear.  Eyes:     Conjunctiva/sclera: Conjunctivae normal.  Cardiovascular:     Rate and Rhythm: Normal rate and regular rhythm.     Heart sounds: Normal heart sounds.  Pulmonary:     Effort: Pulmonary effort is normal. No respiratory distress.     Breath sounds: Normal breath sounds.  Abdominal:     Palpations: Abdomen is soft.     Tenderness: There is no abdominal tenderness.  Musculoskeletal:     Cervical back: Neck supple.  Skin:    General: Skin is warm and dry.  Neurological:     Mental Status: She is alert.  Psychiatric:        Mood and Affect: Mood normal.        Behavior: Behavior normal.     UC Treatments / Results  Labs (all labs ordered are listed, but only abnormal results are  displayed) Labs Reviewed  NOVEL CORONAVIRUS, NAA  POCT INFLUENZA A/B    EKG   Radiology No results found.  Procedures Procedures (including critical care time)  Medications Ordered in UC Medications - No data to display  Initial Impression / Assessment and Plan / UC Course  I have reviewed the triage vital signs and the nursing notes.  Pertinent labs & imaging results that were available during my care of the patient were reviewed by me and considered in my medical decision making (see chart for details).  Acute cough, acute sinusitis.  Rapid flu negative.  COVID pending.  Patient has had a productive cough for a week.  Her lungs are clear, O2 sat 98% on room air.  Treating with amoxicillin.  Treating cough with Tessalon Perles.  Instructed patient to follow-up with her PCP if her symptoms are not improving.  She and her great granddaughter agree to plan of care.   Final Clinical Impressions(s) / UC Diagnoses   Final diagnoses:  Acute cough  Acute non-recurrent maxillary sinusitis     Discharge Instructions      Your COVID test is pending.  Your flu test is negative.    Take the amoxicillin and Tessalon Perles as directed.    Follow up with your primary care provider if your symptoms are not improving.           ED Prescriptions     Medication Sig Dispense Auth. Provider   benzonatate (TESSALON) 100 MG capsule Take 1 capsule (100 mg total) by mouth 3 (three) times daily as needed for cough. 21 capsule Sharion Balloon, NP   amoxicillin (AMOXIL) 875  MG tablet Take 1 tablet (875 mg total) by mouth 2 (two) times daily for 7 days. 14 tablet Sharion Balloon, NP      PDMP not reviewed this encounter.   Sharion Balloon, NP 06/28/21 1440

## 2021-06-28 NOTE — Discharge Instructions (Addendum)
Your COVID test is pending.  Your flu test is negative.    Take the amoxicillin and Tessalon Perles as directed.    Follow up with your primary care provider if your symptoms are not improving.

## 2021-06-29 ENCOUNTER — Ambulatory Visit: Payer: Medicare Other | Admitting: Family Medicine

## 2021-06-29 LAB — NOVEL CORONAVIRUS, NAA: SARS-CoV-2, NAA: NOT DETECTED

## 2021-06-29 LAB — SARS-COV-2, NAA 2 DAY TAT

## 2021-07-08 ENCOUNTER — Other Ambulatory Visit: Payer: Self-pay | Admitting: Family Medicine

## 2021-07-09 NOTE — Telephone Encounter (Signed)
Requested Prescriptions  Pending Prescriptions Disp Refills  . glucose blood (ACCU-CHEK AVIVA PLUS) test strip [Pharmacy Med Name: ACCU-CHEK AVIVA PLUS TEST STRP] 100 strip 1    Sig: USE TO TEST SUGAR TWICE A DAY     Endocrinology: Diabetes - Testing Supplies Passed - 07/08/2021  1:48 PM      Passed - Valid encounter within last 12 months    Recent Outpatient Visits          3 months ago Type 2 diabetes mellitus with other circulatory complication, unspecified whether long term insulin use [facility])   [facility] Jerrol Banana., MD   6 months ago Laceration of head without complication, initial encounter   [facility] Jerrol Banana., MD   8 months ago Choledocholithiasis   [facility] Jerrol Banana., MD   11 months ago Type 2 diabetes mellitus with other circulatory complication, unspecified whether long term insulin use [facility])   [facility] Jerrol Banana., MD   1 year ago Type 2 diabetes mellitus with other circulatory complication, unspecified whether long term insulin use [facility])   [facility] Jerrol Banana., MD

## 2021-07-31 IMAGING — CT CT CERVICAL SPINE W/O CM
3 of 4 series · 9 of 33 positions shown, 11 images · non-contrast
Comparison: Radiographs of the cervical spine 05/19/2018.

CLINICAL DATA: Head trauma, minor. Additional history provided:
Fall, laceration to back of head.

EXAM:
CT HEAD WITHOUT CONTRAST
CT CERVICAL SPINE WITHOUT CONTRAST
TECHNIQUE: Multidetector CT imaging of the head and cervical spine was
performed following the standard protocol without intravenous
contrast. Multiplanar CT image reconstructions of the cervical spine
were also generated.

[Series 4: sagittal bone · sagittal · 0.27mm/px · 5 of 58 slices shown, 6 images]
[im 20/58  bone]
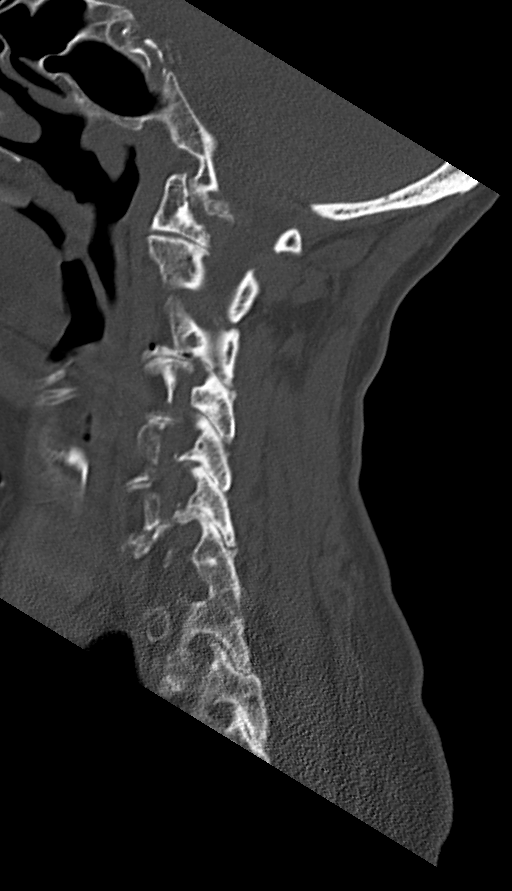
[im 24/58  bone]
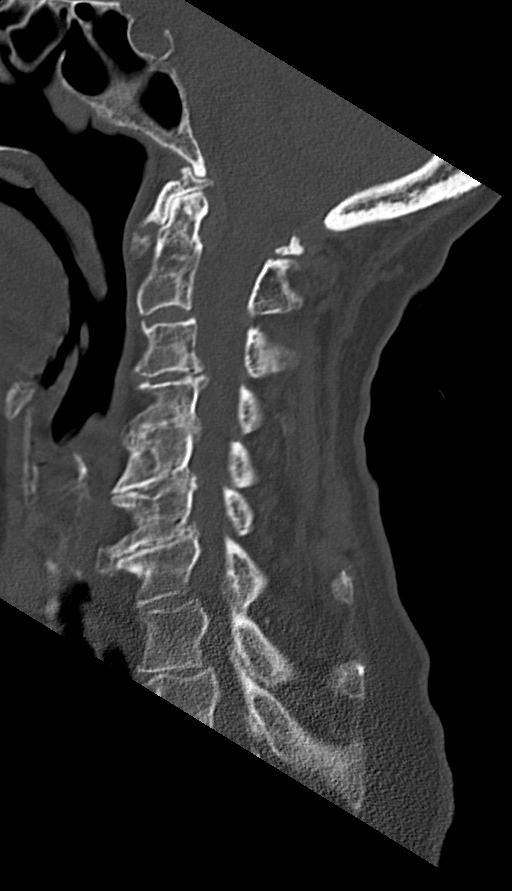
[im 29/58  soft-tissue]
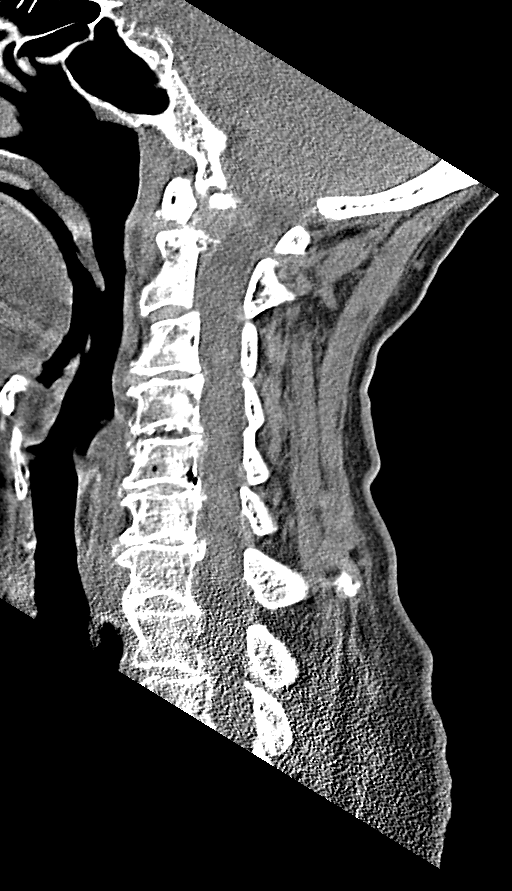
[im 29/58  bone]
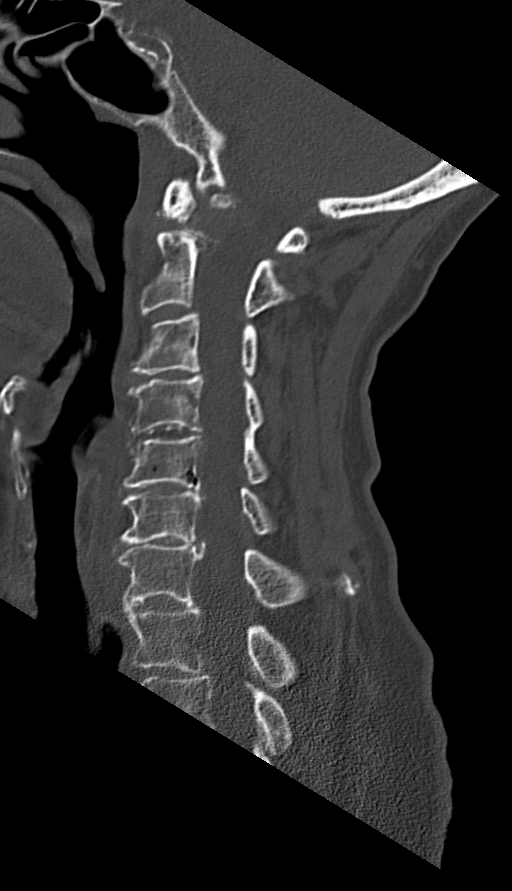
[im 34/58  bone]
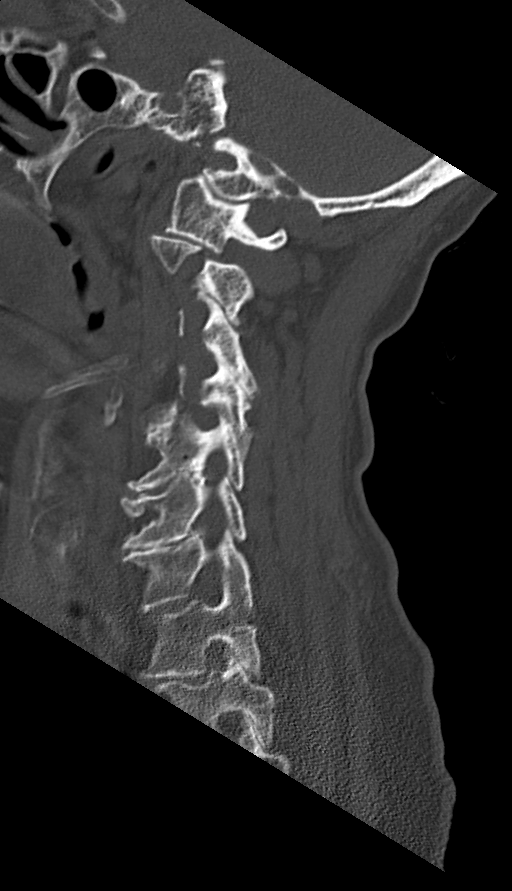
[im 39/58  bone]
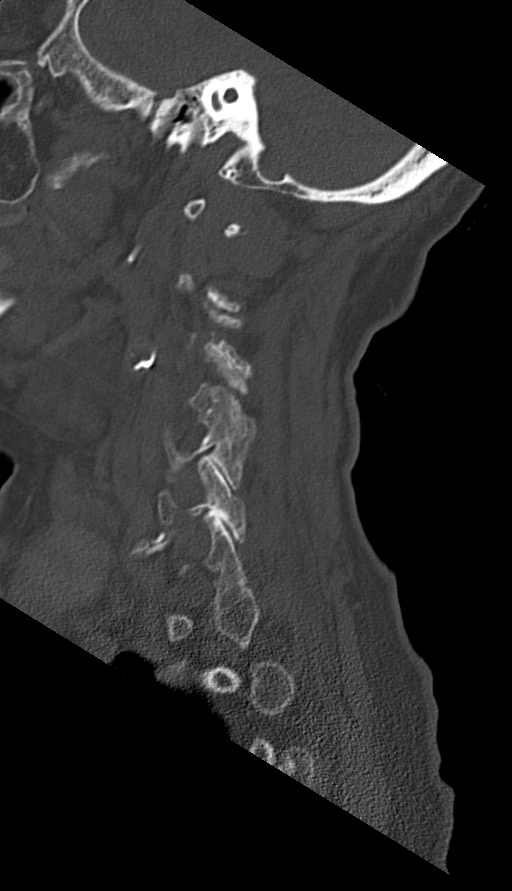

[Series 5: coronal bone · coronal · 0.24mm/px · 3 of 61 slices shown]
[im 16/61  bone]
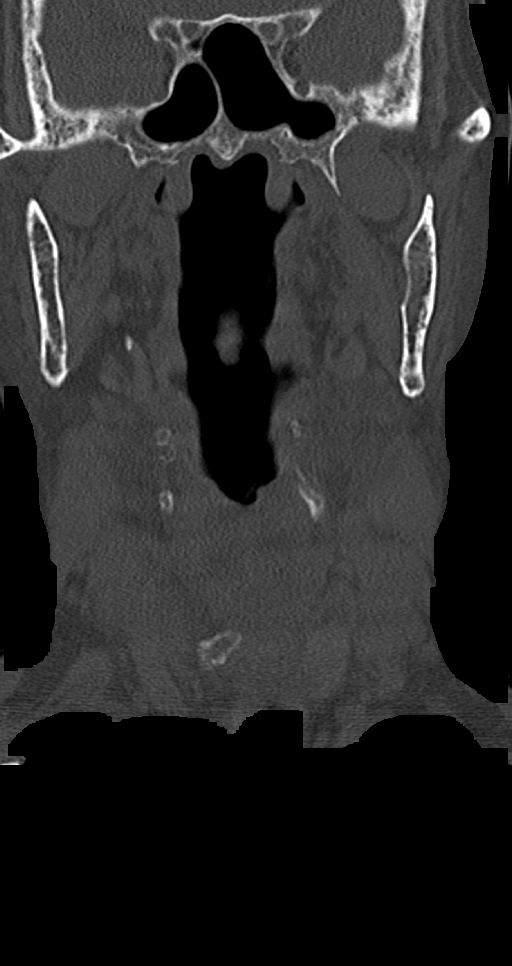
[im 26/61  bone]
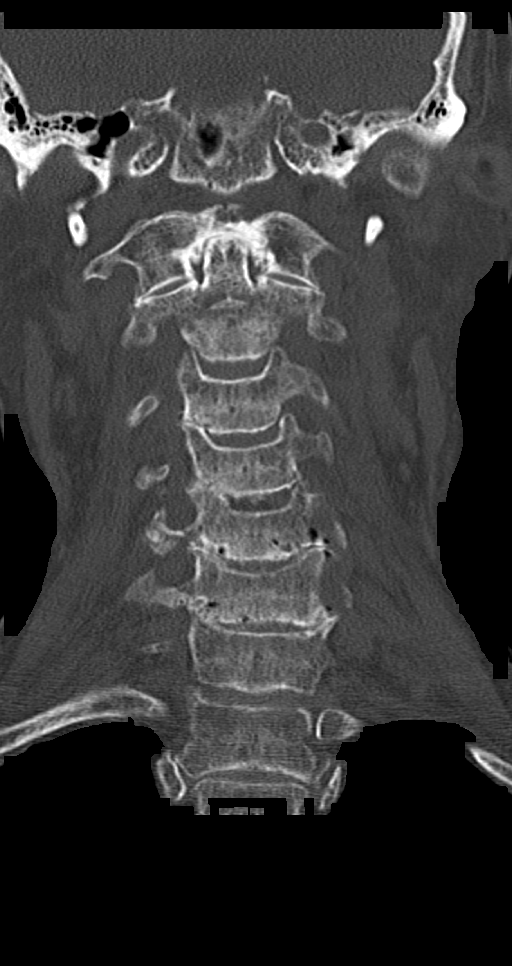
[im 35/61  bone]
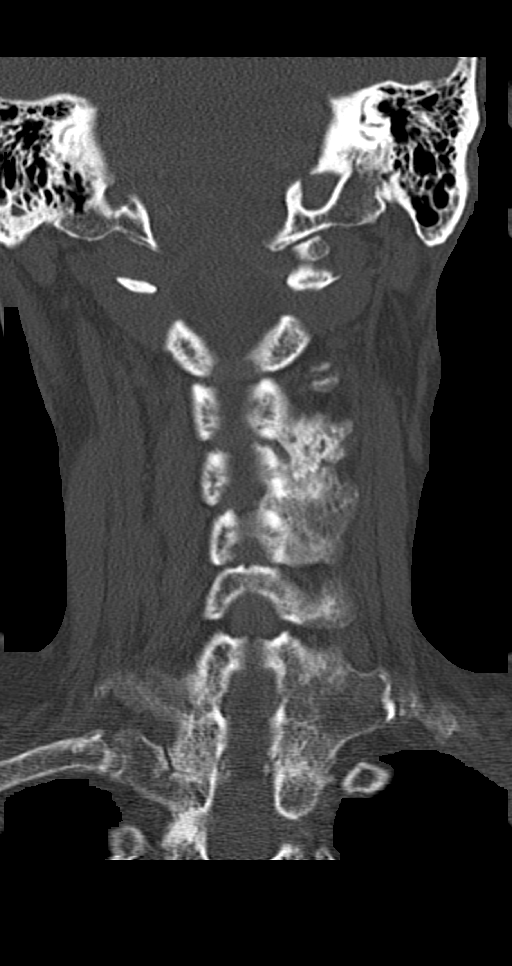

[Series 6: orthogonal bone · axial · 0.23mm/px · z∈[+435,+435]mm · 1 of 112 slices shown, 2 images]
[im 64/112  soft-tissue]
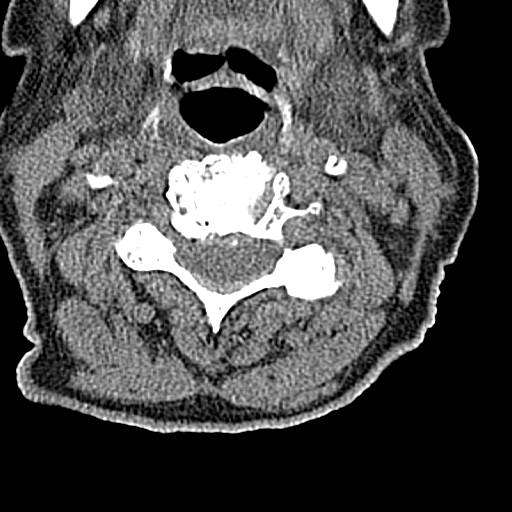
[im 64/112  bone]
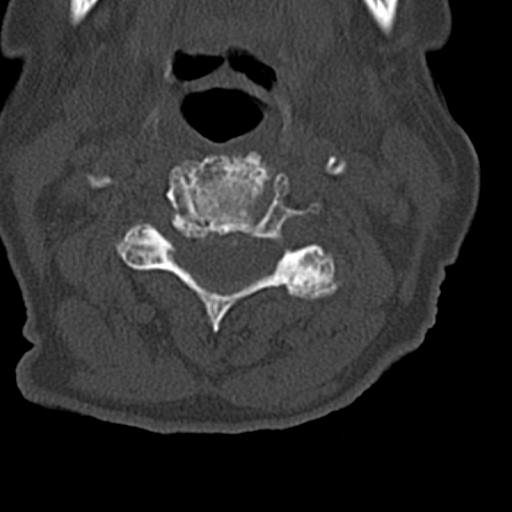

[9 of 33 positions shown; findings below may reference images not displayed]

FINDINGS: CT HEAD FINDINGS

Brain:

Mild cerebral and cerebellar atrophy.

Moderate patchy and ill-defined hypoattenuation within the cerebral
white matter is nonspecific, but compatible with chronic small
vessel ischemic disease.

There is no acute intracranial hemorrhage.

No demarcated cortical infarct.

No extra-axial fluid collection.

No evidence of intracranial mass.

No midline shift.

Vascular: No hyperdense vessel.  Atherosclerotic calcifications

Skull: Normal. Negative for fracture or focal lesion.

Sinuses/Orbits: Visualized orbits show no acute finding. Mild
bilateral ethmoid sinus mucosal thickening at the imaged levels.

Other: Parietooccipital scalp soft tissue swelling.

CT CERVICAL SPINE FINDINGS

Alignment: Straightening of the expected cervical lordosis. 2 mm
C2-C3 grade 1 anterolisthesis. Trace C7-T1 and T1-T2 grade 1
anterolisthesis.

Skull base and vertebrae: The basion-dental and atlanto-dental
intervals are maintained.No evidence of acute fracture to the
cervical spine.

Soft tissues and spinal canal: No prevertebral fluid or swelling. No
visible canal hematoma.

Disc levels: Cervical spondylosis with multilevel disc space
narrowing, disc bulges, posterior disc osteophytes, endplate
spurring, uncovertebral hypertrophy and facet arthrosis. Disc space
narrowing is advanced at C3-C4, C4-C5, C5-C6 and C6-C7. Early fusion
across the C4-C5 disc space. Multilevel ventral osteophytes most
prominent at C5-C6 and C6-C7. Multilevel spinal canal stenosis. Most
notably, a posterior disc osteophyte contributes to suspected
moderate spinal canal stenosis at C5-C6. Multilevel bony neural
foraminal narrowing. Facet joint ankylosis on the left at C4-C5 and
bilaterally at C7-T1.

Upper chest: No consolidation within the imaged lung apices. No
visible pneumothorax.

Other: Bilateral thyroid nodules, the largest arising from the right
lobe measuring 2 cm (series 3, image 74). Chronic, healed fracture
deformity of the posterior left first rib.
IMPRESSION: CT head:

1. No evidence of acute intracranial abnormality.
2. Parietooccipital scalp soft tissue swelling.
3. Moderate cerebral white matter chronic small vessel ischemic
disease.
4. Mild generalized parenchymal atrophy.

CT cervical spine:

1. No evidence of acute fracture to the cervical spine.
2. Nonspecific straightening of the expected cervical lordosis.
3. Mild C2-C3, C7-T1 and T1-T2 grade 1 anterolisthesis.
4. Cervical and upper thoracic spondylosis, as described and with
levels of degenerative appearing fusion.
5. Multiple thyroid lobe nodules, the largest arising from the right
lobe (measuring 2 cm). Given the patient's advanced age, a follow-up
thyroid ultrasound may be obtained for further evaluation only as
clinically appropriate.

## 2021-08-12 ENCOUNTER — Other Ambulatory Visit: Payer: Self-pay | Admitting: Family Medicine

## 2021-08-12 DIAGNOSIS — R059 Cough, unspecified: Secondary | ICD-10-CM

## 2021-08-12 DIAGNOSIS — I10 Essential (primary) hypertension: Secondary | ICD-10-CM

## 2021-08-12 NOTE — Telephone Encounter (Signed)
Requested Prescriptions  Pending Prescriptions Disp Refills   furosemide (LASIX) 20 MG tablet [Pharmacy Med Name: FUROSEMIDE 20 MG TABLET] 90 tablet 0    Sig: TAKE 1 TABLET BY MOUTH EVERY DAY     Cardiovascular:  Diuretics - Loop Failed - 08/12/2021  1:37 AM      Failed - K in normal range and within 360 days    Potassium  Date Value Ref Range Status  04/04/2021 5.3 (H) 3.5 - 5.1 mmol/L Final         Failed - Ca in normal range and within 360 days    Calcium  Date Value Ref Range Status  04/04/2021 8.6 (L) 8.9 - 10.3 mg/dL Final   Calcium, Ion  Date Value Ref Range Status  06/01/2013 1.28 1.13 - 1.30 mmol/L Final         Failed - Cr in normal range and within 360 days    Creat  Date Value Ref Range Status  06/13/2017 1.33 (H) 0.60 - 0.88 mg/dL Final    Comment:    For patients >108 years of age, the reference limit for Creatinine is approximately 13% higher for people identified as African-American. .    Creatinine, Ser  Date Value Ref Range Status  04/04/2021 2.03 (H) 0.44 - 1.00 mg/dL Final         Passed - Na in normal range and within 360 days    Sodium  Date Value Ref Range Status  04/04/2021 135 135 - 145 mmol/L Final  10/28/2020 139 134 - 144 mmol/L Final         Passed - Last BP in normal range    BP Readings from Last 1 Encounters:  06/28/21 (!) 136/58         Passed - Valid encounter within last 6 months    Recent Outpatient Visits          4 months ago Type 2 diabetes mellitus with other circulatory complication, unspecified whether long term insulin use ([place])   [facility] Jerrol Banana., MD   8 months ago Laceration of head without complication, initial encounter   [facility] Jerrol Banana., MD   9 months ago Choledocholithiasis   [facility] Jerrol Banana., MD   1 year ago Type 2 diabetes mellitus with other circulatory complication, unspecified whether long term insulin use  [facility])   [facility] Jerrol Banana., MD   1 year ago Type 2 diabetes mellitus with other circulatory complication, unspecified whether long term insulin use [facility])   [facility] Jerrol Banana., MD

## 2021-08-12 NOTE — Telephone Encounter (Signed)
Requested medication (s) are due for refill today: no, requesting early  Requested medication (s) are on the active medication list: yes  Last refill:  11/29  Future visit scheduled: 08/18/21  Notes to clinic:  requesting too soon, except Levmir, please note pt has not filled since 08/2020. Please assess.      Requested Prescriptions  Pending Prescriptions Disp Refills   hydrochlorothiazide (HYDRODIURIL) 25 MG tablet [Pharmacy Med Name: HYDROCHLOROTHIAZIDE 25 MG TAB] 90 tablet 0    Sig: TAKE 1 TABLET (25 MG TOTAL) BY MOUTH DAILY.     Cardiovascular: Diuretics - Thiazide Failed - 08/12/2021  1:13 PM      Failed - Ca in normal range and within 360 days    Calcium  Date Value Ref Range Status  04/04/2021 8.6 (L) 8.9 - 10.3 mg/dL Final   Calcium, Ion  Date Value Ref Range Status  06/01/2013 1.28 1.13 - 1.30 mmol/L Final          Failed - Cr in normal range and within 360 days    Creat  Date Value Ref Range Status  06/13/2017 1.33 (H) 0.60 - 0.88 mg/dL Final    Comment:    For patients >8 years of age, the reference limit for Creatinine is approximately 13% higher for people identified as African-American. .    Creatinine, Ser  Date Value Ref Range Status  04/04/2021 2.03 (H) 0.44 - 1.00 mg/dL Final          Failed - K in normal range and within 360 days    Potassium  Date Value Ref Range Status  04/04/2021 5.3 (H) 3.5 - 5.1 mmol/L Final          Passed - Na in normal range and within 360 days    Sodium  Date Value Ref Range Status  04/04/2021 135 135 - 145 mmol/L Final  10/28/2020 139 134 - 144 mmol/L Final          Passed - Last BP in normal range    BP Readings from Last 1 Encounters:  06/28/21 (!) 136/58          Passed - Valid encounter within last 6 months    Recent Outpatient Visits           4 months ago Type 2 diabetes mellitus with other circulatory complication, unspecified whether long term insulin use ([place])   [facility]  Jerrol Banana., MD   8 months ago Laceration of head without complication, initial encounter   [facility] Jerrol Banana., MD   9 months ago [place] Jerrol Banana., MD   1 year ago Type 2 diabetes mellitus with other circulatory complication, unspecified whether long term insulin use [facility])   [facility] Jerrol Banana., MD   1 year ago Type 2 diabetes mellitus with other circulatory complication, unspecified whether long term insulin use [facility])   [facility] Jerrol Banana., MD               metoprolol tartrate (LOPRESSOR) 25 MG tablet [Pharmacy Med Name: METOPROLOL TARTRATE 25 MG TAB] 45 tablet 0    Sig: TAKE 1/2 TABLET BY MOUTH EVERY DAY     Cardiovascular:  Beta Blockers Passed - 08/12/2021  1:13 PM      Passed - Last BP in normal range    BP Readings from Last 1 Encounters:  06/28/21 (!) 136/58  Passed - Last Heart Rate in normal range    Pulse Readings from Last 1 Encounters:  06/28/21 65          Passed - Valid encounter within last 6 months    Recent Outpatient Visits           4 months ago Type 2 diabetes mellitus with other circulatory complication, unspecified whether long term insulin use [facility])   [facility] Jerrol Banana., MD   8 months ago Laceration of head without complication, initial encounter   [facility] Jerrol Banana., MD   9 months ago Choledocholithiasis   [facility] Jerrol Banana., MD   1 year ago Type 2 diabetes mellitus with other circulatory complication, unspecified whether long term insulin use [facility])   [facility] Jerrol Banana., MD   1 year ago Type 2 diabetes mellitus with other circulatory complication, unspecified whether long term insulin use [facility])   [facility] Jerrol Banana., MD                LEVEMIR FLEXTOUCH 100 UNIT/ML FlexTouch Pen [Pharmacy Med Name: LEVEMIR FLEXTOUCH 100 UNIT/ML]  5    Sig: INJECT 15 UNITS INTO THE SKIN DAILY. IN THE AFTERNOON     Endocrinology:  Diabetes - Insulins Passed - 08/12/2021  1:13 PM      Passed - HBA1C is between 0 and 7.9 and within 180 days    Hemoglobin A1C  Date Value Ref Range Status  03/25/2021 6.2 (A) 4.0 - 5.6 % Final   Hgb A1c MFr Bld  Date Value Ref Range Status  10/19/2020 6.6 (H) 4.8 - 5.6 % Final    Comment:    (NOTE) Pre diabetes:          5.7%-6.4%  Diabetes:              >6.4%  Glycemic control for   <7.0% adults with diabetes           Passed - Valid encounter within last 6 months    Recent Outpatient Visits           4 months ago Type 2 diabetes mellitus with other circulatory complication, unspecified whether long term insulin use [facility])   [facility] Jerrol Banana., MD   8 months ago Laceration of head without complication, initial encounter   [facility] Jerrol Banana., MD   9 months ago Choledocholithiasis   [facility] Jerrol Banana., MD   1 year ago Type 2 diabetes mellitus with other circulatory complication, unspecified whether long term insulin use [facility])   [facility] Jerrol Banana., MD   1 year ago Type 2 diabetes mellitus with other circulatory complication, unspecified whether long term insulin use ([place])   [facility] Jerrol Banana., MD              Signed Prescriptions Disp Refills   albuterol (VENTOLIN HFA) 108 (90 Base) MCG/ACT inhaler 6.7 each 1    Sig: INHALE 1-2 PUFFS INTO THE LUNGS EVERY 4 HOURS AS NEEDED FOR WHEEZING OR SHORTNESS OF BREATH.     Pulmonology:  Beta Agonists Failed - 08/12/2021  1:13 PM      Failed - One inhaler should last at least one month. If the patient is requesting refills earlier, contact the patient to check for uncontrolled symptoms.  Passed - Valid encounter within last 12 months    Recent Outpatient Visits           4 months ago Type 2 diabetes mellitus with other circulatory complication, unspecified whether long term insulin use [facility])   [facility] Jerrol Banana., MD   8 months ago Laceration of head without complication, initial encounter   [facility] Jerrol Banana., MD   9 months ago Choledocholithiasis   [facility] Jerrol Banana., MD   1 year ago Type 2 diabetes mellitus with other circulatory complication, unspecified whether long term insulin use [facility])   [facility] Jerrol Banana., MD   1 year ago Type 2 diabetes mellitus with other circulatory complication, unspecified whether long term insulin use [facility])   [facility] Jerrol Banana., MD

## 2021-08-16 ENCOUNTER — Other Ambulatory Visit: Payer: Self-pay | Admitting: Family Medicine

## 2021-08-17 NOTE — Progress Notes (Deleted)
Complete physical exam   [name]   DOB: 16-May-1926   88 y.o. Female  MRN: [id number] Visit Date: 08/18/2021  Today's healthcare provider: Mikey Kirschner, PA-C   No chief complaint on file.  Subjective    Gabriela [name] is a 88 y.o. female who presents today for a complete physical exam.  She reports consuming a {diet types:17450} diet. {Exercise:19826} She generally feels {well/fairly well/poorly:18703}. She reports sleeping {well/fairly well/poorly:18703}. She {does/does not:200015} have additional problems to discuss today.  HPI  ***  Past Medical History:  Diagnosis Date   Arthritis    Diabetes mellitus ([place])    Glaucoma    Hyperlipidemia    Hypertension    Shortness of breath    with exertion   Past Surgical History:  Procedure Laterality Date   CARDIAC CATHETERIZATION  04/22/13   CHOLECYSTECTOMY     CORONARY ARTERY BYPASS GRAFT N/A 05/31/2013   Procedure: Coronary Artery Bypass Grafting Times Three Using Left Internal Mammary Artery and Right Saphenous Leg Vein Harvested Endoscopically;  Surgeon: Ivin Poot, MD;  Location: [place];  Service: Open Heart Surgery;  Laterality: N/A;   ERCP N/A 10/20/2020   Procedure: ENDOSCOPIC RETROGRADE CHOLANGIOPANCREATOGRAPHY (ERCP);  Surgeon: Lucilla Lame, MD;  Location[facility] END[facility]COPY;  Service: Endoscopy;  Laterality: N/A;   ERCP N/A 12/22/2020   Procedure: ENDOSCOPIC RETROGRADE CHOLANGIOPANCREATOGRAPHY (ERCP);  Surgeon: Lucilla Lame, MD;  Location: [facility] ENDOSCOPY;  Service: Endoscopy;  Laterality: N/A;   EYE SURGERY     HIP SURGERY     right   INTRAOPERATIVE TRANSESOPHAGEAL ECHOCARDIOGRAM N/A 05/31/2013   Procedure: INTRAOPERATIVE TRANSESOPHAGEAL ECHOCARDIOGRAM;  Surgeon: Ivin Poot, MD;  Location: [place];  Service: Open Heart Surgery;  Laterality: N/A;   JOINT REPLACEMENT     MITRAL VALVE REPAIR N/A 05/31/2013   Procedure: MITRAL VALVE REPAIR (MVR);  Surgeon: Ivin Poot, MD;  Location: [place];   Service: Open Heart Surgery;  Laterality: N/A;   TONSILLECTOMY     Social History   Socioeconomic History   Marital status: Widowed    Spouse name: Not on file   Number of children: 7   Years of education: Not on file   Highest education level: 11th grade  Occupational History   Occupation: retired  Tobacco Use   Smoking status: Never   Smokeless tobacco: Never  Vaping Use   Vaping Use: Never used  Substance and Sexual Activity   Alcohol use: No   Drug use: No   Sexual activity: Never  Other Topics Concern   Not on file  Social History Narrative   Not on file   Social Determinants of Health   Financial Resource Strain: Not on file  Food Insecurity: Not on file  Transportation Needs: Not on file  Physical Activity: Not on file  Stress: Not on file  Social Connections: Not on file  Intimate Partner Violence: Not on file   Family Status  Relation Name Status   Sister  Deceased   Mother  Deceased at age 48       cause of death was old age   Father  Deceased       cause of death unknown, he had an accident at work   Brother  Deceased       cause of death MI   Brother  Deceased       cause of death MI   Brother  Deceased       cause of  death MI   Brother  Deceased       unknown cause of death   Sister  86   Sister  Alive   Sister  79   Family History  Problem Relation Age of Onset   Diabetes Sister    Hypertension Mother    Allergies  Allergen Reactions   Sulfa Antibiotics Rash    Patient Care Team: Jerrol Banana., MD as PCP - General (Family Medicine) Corey Skains, MD as Referring Physician (Internal Medicine) Lorelee Cover., MD (Ophthalmology)   Medications: Outpatient Medications Prior to Visit  Medication Sig   Accu-Chek FastClix Lancets MISC To check blood sugars once a day. DX E11.9   albuterol (VENTOLIN HFA) 108 (90 Base) MCG/ACT inhaler INHALE 1-2 PUFFS INTO THE LUNGS EVERY 4 HOURS AS NEEDED FOR WHEEZING OR SHORTNESS OF  BREATH.   aspirin EC 81 MG tablet Take 81 mg by mouth daily.   benzonatate (TESSALON) 100 MG capsule Take 1 capsule (100 mg total) by mouth 3 (three) times daily as needed for cough.   Blood Glucose Monitoring Suppl (ACCU-CHEK AVIVA PLUS) w/Device KIT 1 each by Does not apply route daily. Dx E11.9   ferrous sulfate 325 (65 FE) MG tablet TAKE 1 TABLET BY MOUTH EVERY OTHER DAY   furosemide (LASIX) 20 MG tablet TAKE 1 TABLET BY MOUTH EVERY DAY   glucose blood (ACCU-CHEK AVIVA PLUS) test strip USE TO TEST SUGAR TWICE A DAY   hydrochlorothiazide (HYDRODIURIL) 25 MG tablet TAKE 1 TABLET (25 MG TOTAL) BY MOUTH DAILY.   Insulin Pen Needle (B-D UF III MINI PEN NEEDLES) 31G X 5 MM MISC USE WITH INSULIN DOSES   LEVEMIR FLEXTOUCH 100 UNIT/ML FlexTouch Pen INJECT 15 UNITS INTO THE SKIN DAILY. IN THE AFTERNOON   metoprolol tartrate (LOPRESSOR) 25 MG tablet TAKE 1/2 TABLET BY MOUTH EVERY DAY   ondansetron (ZOFRAN ODT) 4 MG disintegrating tablet Take 1 tablet (4 mg total) by mouth every 8 (eight) hours as needed.   timolol (TIMOPTIC) 0.5 % ophthalmic solution Place 1 drop into both eyes every morning.   No facility-administered medications prior to visit.    Review of Systems  {Labs   Heme   Chem   Endocrine   Serology   Results Review (optional):23779}  Objective    There were no vitals taken for this visit. {Show previous vital signs (optional):23777}  Physical Exam  ***  Last depression screening scores PHQ 2/9 Scores 12/14/2020 03/10/2020 03/29/2018  PHQ - 2 Score 0 0 0  PHQ- 9 Score 1 - -   Last fall risk screening Fall Risk  12/14/2020  Falls in the past year? 1  Number falls in past yr: 1  Injury with Fall? 1  Risk for fall due to : Impaired mobility;Impaired balance/gait;Impaired vision;History of fall(s);Orthopedic patient  Follow up Falls evaluation completed   Last Audit-C alcohol use screening Alcohol Use Disorder Test (AUDIT) 12/14/2020  1. How often do you have a drink containing  alcohol? 0  2. How many drinks containing alcohol do you have on a typical day when you are drinking? 0  3. How often do you have six or more drinks on one occasion? 0  AUDIT-C Score 0  Alcohol Brief Interventions/Follow-up -   A score of 3 or more in women, and 4 or more in men indicates increased risk for alcohol abuse, EXCEPT if all of the points are from question 1   No results found for any visits  on 08/18/21.  Assessment & Plan    Routine Health Maintenance and Physical Exam  Exercise Activities and Dietary recommendations  Goals       Medication optimization (pt-stated)      Current Barriers:  Low Vision and Diminished manual dexterity  Pharmacist Clinical Goal(s):  Over the next 60 days, patient will demonstrate improved understanding of prescribed medications and rationale for usage as evidenced by patient report  Interventions: Comprehensive medication review performed. Updated medication list in EMR Provided personalized medication guide Referred to provider follow up visit, patient missed follow up in May and has not rescheduled  Patient Self Care Activities:  Self administers medications as prescribed Attends all scheduled provider appointments Calls pharmacy for medication refills  Initial goal documentation       Obtain Annual Eye (retinal)  Exam       Recommend completing a yearly diabetic eye exam. Pt to schedule an apt with Dr Gloriann Loan this year.         Immunization History  Administered Date(s) Administered   Fluad Quad(high Dose 65+) 07/28/2020   Influenza, High Dose Seasonal PF 05/06/2015, 05/09/2016, 06/13/2017   PFIZER(Purple Top)SARS-COV-2 Vaccination 10/09/2019, 10/30/2019, 05/29/2020   Pneumococcal Conjugate-13 03/11/2014   Pneumococcal Polysaccharide-23 07/27/2005   Td 11/10/1994   Tdap 03/05/2013   Zoster, Live 03/05/2013    Health Maintenance  Topic Date Due   FOOT EXAM  Never done   OPHTHALMOLOGY EXAM  Never done   URINE  MICROALBUMIN  Never done   Zoster Vaccines- Shingrix (1 of 2) Never done   DEXA SCAN  09/14/2007   COVID-19 Vaccine (4 - Booster for Pfizer series) 07/24/2020   INFLUENZA VACCINE  03/15/2021   HEMOGLOBIN A1C  09/25/2021   TETANUS/TDAP  03/06/2023   Pneumonia Vaccine 37+ Years old  Completed   HPV VACCINES  Aged Out    Discussed health benefits of physical activity, and encouraged her to engage in regular exercise appropriate for her age and condition.  ***  No follow-ups on file.     {provider attestation***:1}   Mikey Kirschner, PA-C  [facility] [phone] (phone) [phone] (fax)  [place]

## 2021-08-18 ENCOUNTER — Encounter: Payer: Medicare Other | Admitting: Family Medicine

## 2021-08-18 ENCOUNTER — Encounter: Payer: Self-pay | Admitting: Family Medicine

## 2021-08-18 ENCOUNTER — Encounter: Payer: Medicare Other | Admitting: Physician Assistant

## 2021-09-01 NOTE — Telephone Encounter (Signed)
Copied from [place] [id number]. Topic: Appointment Scheduling - Scheduling Inquiry for Clinic >> Sep 01, 2021 12:13 PM Valere Dross wrote: Reason for CRM: Pt called in stating they wanted to reschedule appt for [place], and needed a nurse to reschedule, please advise.

## 2021-09-03 ENCOUNTER — Other Ambulatory Visit: Payer: Self-pay | Admitting: Family Medicine

## 2021-09-03 NOTE — Telephone Encounter (Signed)
Attempted to call pharmacy- hold +5 minutes Rx 08/12/21 #90- too soon Requested Prescriptions  Pending Prescriptions Disp Refills   furosemide (LASIX) 20 MG tablet [Pharmacy Med Name: FUROSEMIDE 20 MG TABLET] 90 tablet 0    Sig: TAKE 1 TABLET BY MOUTH EVERY DAY     Cardiovascular:  Diuretics - Loop Failed - 09/03/2021  8:36 AM      Failed - K in normal range and within 360 days    Potassium  Date Value Ref Range Status  04/04/2021 5.3 (H) 3.5 - 5.1 mmol/L Final         Failed - Ca in normal range and within 360 days    Calcium  Date Value Ref Range Status  04/04/2021 8.6 (L) 8.9 - 10.3 mg/dL Final   Calcium, Ion  Date Value Ref Range Status  06/01/2013 1.28 1.13 - 1.30 mmol/L Final         Failed - Cr in normal range and within 360 days    Creat  Date Value Ref Range Status  06/13/2017 1.33 (H) 0.60 - 0.88 mg/dL Final    Comment:    For patients >21 years of age, the reference limit for Creatinine is approximately 13% higher for people identified as African-American. .    Creatinine, Ser  Date Value Ref Range Status  04/04/2021 2.03 (H) 0.44 - 1.00 mg/dL Final         Passed - Na in normal range and within 360 days    Sodium  Date Value Ref Range Status  04/04/2021 135 135 - 145 mmol/L Final  10/28/2020 139 134 - 144 mmol/L Final         Passed - Last BP in normal range    BP Readings from Last 1 Encounters:  06/28/21 (!) 136/58         Passed - Valid encounter within last 6 months    Recent Outpatient Visits          5 months ago Type 2 diabetes mellitus with other circulatory complication, unspecified whether long term insulin use ([place])   [facility] Jerrol Banana., MD   8 months ago Laceration of head without complication, initial encounter   [facility] Jerrol Banana., MD   10 months ago Choledocholithiasis   [facility] Jerrol Banana., MD   1 year ago Type 2 diabetes mellitus with  other circulatory complication, unspecified whether long term insulin use [facility])   [facility] Jerrol Banana., MD   1 year ago Type 2 diabetes mellitus with other circulatory complication, unspecified whether long term insulin use [facility])   [facility] Jerrol Banana., MD

## 2021-09-04 MED ORDER — LEVEMIR FLEXTOUCH 100 UNIT/ML ~~LOC~~ SOPN: 15.0000 [IU] | PEN_INJECTOR | Freq: Every day | SUBCUTANEOUS | 0 refills | Status: DC

## 2021-09-04 NOTE — Addendum Note (Signed)
Addended by: Corky Sox E on: 09/04/2021 12:26 PM   Modules accepted: Orders

## 2021-09-04 NOTE — Telephone Encounter (Signed)
Requested Prescriptions  Pending Prescriptions Disp Refills   LEVEMIR FLEXTOUCH 100 UNIT/ML FlexTouch Pen [Pharmacy Med Name: LEVEMIR FLEXTOUCH 100 UNIT/ML]  5    Sig: INJECT 15 UNITS INTO THE SKIN DAILY. IN THE AFTERNOON     Endocrinology:  Diabetes - Insulins Passed - 09/03/2021  5:48 PM      Passed - HBA1C is between 0 and 7.9 and within 180 days    Hemoglobin A1C  Date Value Ref Range Status  03/25/2021 6.2 (A) 4.0 - 5.6 % Final   Hgb A1c MFr Bld  Date Value Ref Range Status  10/19/2020 6.6 (H) 4.8 - 5.6 % Final    Comment:    (NOTE) Pre diabetes:          5.7%-6.4%  Diabetes:              >6.4%  Glycemic control for   <7.0% adults with diabetes          Passed - Valid encounter within last 6 months    Recent Outpatient Visits          5 months ago Type 2 diabetes mellitus with other circulatory complication, unspecified whether long term insulin use ([place])   [facility] Jerrol Banana., MD   8 months ago Laceration of head without complication, initial encounter   [facility] Jerrol Banana., MD   10 months ago Choledocholithiasis   [facility] Jerrol Banana., MD   1 year ago Type 2 diabetes mellitus with other circulatory complication, unspecified whether long term insulin use ([place])   [facility] Jerrol Banana., MD   1 year ago Type 2 diabetes mellitus with other circulatory complication, unspecified whether long term insulin use ([place])   [facility] Jerrol Banana., MD              furosemide (LASIX) 20 MG tablet [Pharmacy Med Name: FUROSEMIDE 20 MG TABLET] 90 tablet     Sig: TAKE 1 TABLET BY MOUTH EVERY DAY     Cardiovascular:  Diuretics - Loop Failed - 09/03/2021  5:48 PM      Failed - K in normal range and within 360 days    Potassium  Date Value Ref Range Status  04/04/2021 5.3 (H) 3.5 - 5.1 mmol/L Final         Failed - Ca in normal range  and within 360 days    Calcium  Date Value Ref Range Status  04/04/2021 8.6 (L) 8.9 - 10.3 mg/dL Final   Calcium, Ion  Date Value Ref Range Status  06/01/2013 1.28 1.13 - 1.30 mmol/L Final         Failed - Cr in normal range and within 360 days    Creat  Date Value Ref Range Status  06/13/2017 1.33 (H) 0.60 - 0.88 mg/dL Final    Comment:    For patients >58 years of age, the reference limit for Creatinine is approximately 13% higher for people identified as African-American. .    Creatinine, Ser  Date Value Ref Range Status  04/04/2021 2.03 (H) 0.44 - 1.00 mg/dL Final         Passed - Na in normal range and within 360 days    Sodium  Date Value Ref Range Status  04/04/2021 135 135 - 145 mmol/L Final  10/28/2020 139 134 - 144 mmol/L Final         Passed - Last BP  in normal range    BP Readings from Last 1 Encounters:  06/28/21 (!) 136/58         Passed - Valid encounter within last 6 months    Recent Outpatient Visits          5 months ago Type 2 diabetes mellitus with other circulatory complication, unspecified whether long term insulin use ([place])   [facility] Jerrol Banana., MD   8 months ago Laceration of head without complication, initial encounter   [facility] Jerrol Banana., MD   10 months ago Choledocholithiasis   [facility] Jerrol Banana., MD   1 year ago Type 2 diabetes mellitus with other circulatory complication, unspecified whether long term insulin use [facility])   [facility] Jerrol Banana., MD   1 year ago Type 2 diabetes mellitus with other circulatory complication, unspecified whether long term insulin use [facility])   [facility] Jerrol Banana., MD             Signed Prescriptions Disp Refills   B-D UF III MINI PEN NEEDLES 31G X 5 MM MISC 100 each 5    Sig: USE WITH INSULIN DOSES     Endocrinology: Diabetes - Testing Supplies Passed -  09/03/2021  5:48 PM      Passed - Valid encounter within last 12 months    Recent Outpatient Visits          5 months ago Type 2 diabetes mellitus with other circulatory complication, unspecified whether long term insulin use [facility])   [facility] Jerrol Banana., MD   8 months ago Laceration of head without complication, initial encounter   [facility] Jerrol Banana., MD   10 months ago Choledocholithiasis   [facility] Jerrol Banana., MD   1 year ago Type 2 diabetes mellitus with other circulatory complication, unspecified whether long term insulin use [facility])   [facility] Jerrol Banana., MD   1 year ago Type 2 diabetes mellitus with other circulatory complication, unspecified whether long term insulin use [facility])   [facility] Jerrol Banana., MD

## 2021-09-04 NOTE — Telephone Encounter (Signed)
Levemir was sent back transmission did not go through. Phoned in order to [facility]. Dispense:15 ml no refills Sig:15 units daily in the afternoon. Requested Prescriptions  Pending Prescriptions Disp Refills   insulin detemir (LEVEMIR FLEXTOUCH) 100 UNIT/ML FlexPen 15 mL 0    Sig: Inject 15 Units into the skin daily. In the afternoon     There is no refill protocol information for this order    Signed Prescriptions Disp Refills   insulin detemir (LEVEMIR FLEXTOUCH) 100 UNIT/ML FlexPen 15 mL 0    Sig: INJECT 15 UNITS INTO THE SKIN DAILY. IN THE AFTERNOON     Endocrinology:  Diabetes - Insulins Passed - 09/03/2021  5:48 PM      Passed - HBA1C is between 0 and 7.9 and within 180 days    Hemoglobin A1C  Date Value Ref Range Status  03/25/2021 6.2 (A) 4.0 - 5.6 % Final   Hgb A1c MFr Bld  Date Value Ref Range Status  10/19/2020 6.6 (H) 4.8 - 5.6 % Final    Comment:    (NOTE) Pre diabetes:          5.7%-6.4%  Diabetes:              >6.4%  Glycemic control for   <7.0% adults with diabetes           Passed - Valid encounter within last 6 months    Recent Outpatient Visits           5 months ago Type 2 diabetes mellitus with other circulatory complication, unspecified whether long term insulin use ([place])   [facility] Jerrol Banana., MD   8 months ago Laceration of head without complication, initial encounter   [facility] Jerrol Banana., MD   10 months ago Choledocholithiasis   [facility] Jerrol Banana., MD   1 year ago Type 2 diabetes mellitus with other circulatory complication, unspecified whether long term insulin use [facility])   [facility] Jerrol Banana., MD   1 year ago Type 2 diabetes mellitus with other circulatory complication, unspecified whether long term insulin use ([place])   [facility] Jerrol Banana., MD               B-D UF III MINI PEN NEEDLES  31G X 5 MM MISC 100 each 5    Sig: USE WITH INSULIN DOSES     Endocrinology: Diabetes - Testing Supplies Passed - 09/03/2021  5:48 PM      Passed - Valid encounter within last 12 months    Recent Outpatient Visits           5 months ago Type 2 diabetes mellitus with other circulatory complication, unspecified whether long term insulin use [facility])   [facility] Jer[facility] Banana., MD   8 months ago Laceration of head without complication, initial encounter   [facility] Jerrol Banana., MD   10 months ago Choledocholithiasis   [facility] Jerrol Banana., MD   1 year ago Type 2 diabetes mellitus with other circulatory complication, unspecified whether long term insulin use [facility])   [facility] Jerrol Banana., MD   1 year ago Type 2 diabetes mellitus with other circulatory complication, unspecified whether long term insulin use  [facility])   [facility] Jerrol Banana., MD  Refused Prescriptions Disp Refills   furosemide (LASIX) 20 MG tablet [Pharmacy Med Name: FUROSEMIDE 20 MG TABLET] 90 tablet     Sig: TAKE 1 TABLET BY MOUTH EVERY DAY     Cardiovascular:  Diuretics - Loop Failed - 09/03/2021  5:48 PM      Failed - K in normal range and within 360 days    Potassium  Date Value Ref Range Status  04/04/2021 5.3 (H) 3.5 - 5.1 mmol/L Final          Failed - Ca in normal range and within 360 days    Calcium  Date Value Ref Range Status  04/04/2021 8.6 (L) 8.9 - 10.3 mg/dL Final   Calcium, Ion  Date Value Ref Range Status  06/01/2013 1.28 1.13 - 1.30 mmol/L Final          Failed - Cr in normal range and within 360 days    Creat  Date Value Ref Range Status  06/13/2017 1.33 (H) 0.60 - 0.88 mg/dL Final    Comment:    For patients >81 years of age, the reference limit for Creatinine is approximately 13% higher for people identified as African-American. .     Creatinine, Ser  Date Value Ref Range Status  04/04/2021 2.03 (H) 0.44 - 1.00 mg/dL Final          Passed - Na in normal range and within 360 days    Sodium  Date Value Ref Range Status  04/04/2021 135 135 - 145 mmol/L Final  10/28/2020 139 134 - 144 mmol/L Final          Passed - Last BP in normal range    BP Readings from Last 1 Encounters:  06/28/21 (!) 136/58          Passed - Valid encounter within last 6 months    Recent Outpatient Visits           5 months ago Type 2 diabetes mellitus with other circulatory complication, unspecified whether long term insulin use ([place])   [facility] Jerrol Banana., MD   8 months ago Laceration of head without complication, initial encounter   [facility] Jerrol Banana., MD   10 months ago Choledocholithiasis   [facility] Jerrol Banana., MD   1 year ago Type 2 diabetes mellitus with other circulatory complication, unspecified whether long term insulin use [facility])   [facility] Jer[facility]., MD   1 year ago Type 2 diabetes mellitus with other circulatory complication, unspecified whether long term insulin use [facility])   [facility] Jerrol Banana., MD

## 2021-09-04 NOTE — Telephone Encounter (Signed)
Requested Prescriptions  Pending Prescriptions Disp Refills   LEVEMIR FLEXTOUCH 100 UNIT/ML FlexTouch Pen [Pharmacy Med Name: LEVEMIR FLEXTOUCH 100 UNIT/ML]  5    Sig: INJECT 15 UNITS INTO THE SKIN DAILY. IN THE AFTERNOON     Endocrinology:  Diabetes - Insulins Passed - 09/03/2021  5:48 PM      Passed - HBA1C is between 0 and 7.9 and within 180 days    Hemoglobin A1C  Date Value Ref Range Status  03/25/2021 6.2 (A) 4.0 - 5.6 % Final   Hgb A1c MFr Bld  Date Value Ref Range Status  10/19/2020 6.6 (H) 4.8 - 5.6 % Final    Comment:    (NOTE) Pre diabetes:          5.7%-6.4%  Diabetes:              >6.4%  Glycemic control for   <7.0% adults with diabetes          Passed - Valid encounter within last 6 months    Recent Outpatient Visits          5 months ago Type 2 diabetes mellitus with other circulatory complication, unspecified whether long term insulin use ([place])   [facility] Jerrol Banana., MD   8 months ago Laceration of head without complication, initial encounter   [facility] Jerrol Banana., MD   10 months ago Choledocholithiasis   [facility] Jerrol Ban[facility] MD   1 year ago Type 2 diabetes mellitus with other circulatory complication, unspecified whether long term insulin use [facility])   [facility] Jerrol Banana., MD   1 year ago Type 2 diabetes mellitus with other circulatory complication, unspecified whether long term insulin use ([place])   [facility] Jerrol Banana., MD              B-D UF III MINI PEN NEEDLES 31G X 5 MM MISC [Pharmacy Med Name: BD UF MINI PEN NEEDLE [id number]] 100 each 5    Sig: USE WITH INSULIN DOSES     Endocrinology: Diabetes - Testing Supplies Passed - 09/03/2021  5:48 PM      Passed - Valid encounter within last 12 months    Recent Outpatient Visits          5 months ago Type 2 diabetes mellitus with other circulatory  complication, unspecified whether long term insulin use [facility])   [facility] Jerrol Ban[facility]., MD   8 months ago Laceration of head without complication, initial encounter   [facility] Jer[facility] MD   10 months ago Choledocholithiasis   [facility] Jer[facility] Banana., MD   1 year ago Type 2 diabetes mellitus with other circulatory complication, unspecified whether long term insulin use [facility])   [facility] Jerrol Banana., MD   1 year ago Type 2 diabetes mellitus with other circulatory complication, unspecified whether long term insulin use [facility])   [facility] Jerrol Banana., MD              furosemide (LASIX) 20 MG tablet [Pharmacy Med Name: FUROSEMIDE 20 MG TABLET] 90 tablet     Sig: TAKE 1 TABLET BY MOUTH EVERY DAY     Cardiovascular:  Diuretics - Loop Failed - 09/03/2021  5:48 PM      Failed - K in normal range and within 360 days  Potassium  Date Value Ref Range Status  04/04/2021 5.3 (H) 3.5 - 5.1 mmol/L Final         Failed - Ca in normal range and within 360 days    Calcium  Date Value Ref Range Status  04/04/2021 8.6 (L) 8.9 - 10.3 mg/dL Final   Calcium, Ion  Date Value Ref Range Status  06/01/2013 1.28 1.13 - 1.30 mmol/L Final         Failed - Cr in normal range and within 360 days    Creat  Date Value Ref Range Status  06/13/2017 1.33 (H) 0.60 - 0.88 mg/dL Final    Comment:    For patients >47 years of age, the reference limit for Creatinine is approximately 13% higher for people identified as African-American. .    Creatinine, Ser  Date Value Ref Range Status  04/04/2021 2.03 (H) 0.44 - 1.00 mg/dL Final         Passed - Na in normal range and within 360 days    Sodium  Date Value Ref Range Status  04/04/2021 135 135 - 145 mmol/L Final  10/28/2020 139 134 - 144 mmol/L Final         Passed - Last BP in normal range    BP Readings from  Last 1 Encounters:  06/28/21 (!) 136/58         Passed - Valid encounter within last 6 months    Recent Outpatient Visits          5 months ago Type 2 diabetes mellitus with other circulatory complication, unspecified whether long term insulin use ([place])   [facility] Jer[facility] Ban[facility] MD   8 months ago Laceration of head without complication, initial encounter   [facility] Ban[facility]., MD   10 months ago Choledocholithiasis   [facility]   1 year ago Type 2 diabetes mellitus with other circulatory complication, unspecified whether long term insulin use [facility])   [facility] Jerrol Banana., MD   1 year ago Type 2 diabetes mellitus with other circulatory complication, unspecified whether long term insulin use [facility])   [facility] Jerrol Banana., MD

## 2021-09-04 NOTE — Telephone Encounter (Signed)
Called the pharmacy and there are no recent refill- last RF exp date 08/13/21. Last Hgb A1C 03/25/21 due for repeat lab 2/23  Requested Prescriptions  Pending Prescriptions Disp Refills   LEVEMIR FLEXTOUCH 100 UNIT/ML FlexTouch Pen [Pharmacy Med Name: LEVEMIR FLEXTOUCH 100 UNIT/ML]  5    Sig: INJECT 15 UNITS INTO THE SKIN DAILY. IN THE AFTERNOON     Endocrinology:  Diabetes - Insulins Passed - 09/03/2021  5:48 PM      Passed - HBA1C is between 0 and 7.9 and within 180 days    Hemoglobin A1C  Date Value Ref Range Status  03/25/2021 6.2 (A) 4.0 - 5.6 % Final   Hgb A1c MFr Bld  Date Value Ref Range Status  10/19/2020 6.6 (H) 4.8 - 5.6 % Final    Comment:    (NOTE) Pre diabetes:          5.7%-6.4%  Diabetes:              >6.4%  Glycemic control for   <7.0% adults with diabetes           Passed - Valid encounter within last 6 months    Recent Outpatient Visits           5 months ago Type 2 diabetes mellitus with other circulatory complication, unspecified whether long term insulin use ([place])   [facility] Jerrol Ban[facility]., MD   8 months ago Laceration of head without complication, initial encounter   [facility] Jer[facility]., MD   10 months ago Choledocholithiasis   [facility] Jerrol [facility] MD   1 year ago Type 2 diabetes mellitus with other circulatory complication, unspecified whether long term insulin use [facility])   [facility] Jerrol Banana., MD   1 year ago Type 2 diabetes mellitus with other circulatory complication, unspecified whether long term insulin use ([place])   [facility] Jerrol Banana., MD              Signed Prescriptions Disp Refills   B-D UF III MINI PEN NEEDLES 31G X 5 MM MISC 100 each 5    Sig: USE WITH INSULIN DOSES     Endocrinology: Diabetes - Testing Supplies Passed - 09/03/2021  5:48 PM      Passed - Valid encounter within last 12  months    Recent Outpatient Visits           5 months ago Type 2 diabetes mellitus with other circulatory complication, unspecified whether long term insulin use [facility])   [facility] Jer[facility] Ban[facility]., MD   8 months ago Laceration of head without complication, initial encounter   [facility] Jerrol Banana., MD   10 months ago Choledocholithiasis   [facility] Jerrol Banana., MD   1 year ago Type 2 diabetes mellitus with other circulatory complication, unspecified whether long term insulin use [facility])   [facility] Jerrol Banana., MD   1 year ago Type 2 diabetes mellitus with other circulatory complication, unspecified whether long term insulin use [facility])   [facility] Jerrol Banana., MD              Refused Prescriptions Disp Refills   furosemide (LASIX) 20 MG tablet [Pharmacy Med Name: FUROSEMIDE 20 MG TABLET] 90 tablet     Sig: TAKE 1 TABLET BY MOUTH EVERY DAY  Cardiovascular:  Diuretics - Loop Failed - 09/03/2021  5:48 PM      Failed - K in normal range and within 360 days    Potassium  Date Value Ref Range Status  04/04/2021 5.3 (H) 3.5 - 5.1 mmol/L Final          Failed - Ca in normal range and within 360 days    Calcium  Date Value Ref Range Status  04/04/2021 8.6 (L) 8.9 - 10.3 mg/dL Final   Calcium, Ion  Date Value Ref Range Status  06/01/2013 1.28 1.13 - 1.30 mmol/L Final          Failed - Cr in normal range and within 360 days    Creat  Date Value Ref Range Status  06/13/2017 1.33 (H) 0.60 - 0.88 mg/dL Final    Comment:    For patients >91 years of age, the reference limit for Creatinine is approximately 13% higher for people identified as African-American. .    Creatinine, Ser  Date Value Ref Range Status  04/04/2021 2.03 (H) 0.44 - 1.00 mg/dL Final          Passed - Na in normal range and within 360 days    Sodium  Date Value Ref  Range Status  04/04/2021 135 135 - 145 mmol/L Final  10/28/2020 139 134 - 144 mmol/L Final          Passed - Last BP in normal range    BP Readings from Last 1 Encounters:  06/28/21 (!) 136/58          Passed - Valid encounter within last 6 months    Recent Outpatient Visits           5 months ago Type 2 diabetes mellitus with other circulatory complication, unspecified whether long term insulin use ([place])   [facility] Jerrol [facility]., MD   8 months ago Laceration of head without complication, initial encounter   [facility] Jerrol Banana., MD   10 months ago Choledocholithiasis   [facility] Jerrol Banana., MD   1 year ago Type 2 diabetes mellitus with other circulatory complication, unspecified whether long term insulin use [facility])   [facility] Jerrol Banana., MD   1 year ago Type 2 diabetes mellitus with other circulatory complication, unspecified whether long term insulin use [facility])   [facility] Jerrol Banana., MD

## 2021-09-05 NOTE — Telephone Encounter (Signed)
Was sent back "transmission did not go through"- Phoned in med to pharmacy.   Transmission today "prescription no longer active"  Please clarify if pt's Levemir is active. The phoned in refill was dc'd by this NT.   Saw in chart review that Levemir was dc'd 08/13/21 Dorian Pod.  Thank you so much! My apologies!

## 2021-09-05 NOTE — Addendum Note (Signed)
Addended by: Carlisle Beers on: 09/05/2021 12:45 PM   Modules accepted: Orders

## 2021-09-07 ENCOUNTER — Ambulatory Visit (INDEPENDENT_AMBULATORY_CARE_PROVIDER_SITE_OTHER): Payer: Medicare Other

## 2021-09-07 DIAGNOSIS — Z0000 Encounter for general adult medical examination without abnormal findings: Secondary | ICD-10-CM

## 2021-09-07 NOTE — Progress Notes (Signed)
Virtual Visit via Telephone Note  I connected with  Gabriela [name] on 09/07/21 at  1:40 PM EST by telephone and verified that I am speaking with the correct person using two identifiers.  Location: Patient: home Provider: [facility] Persons participating in the virtual visit: [place]   I discussed the limitations, risks, security and privacy concerns of performing an evaluation and management service by telephone and the availability of in person appointments. The patient expressed understanding and agreed to proceed.  Interactive audio and video telecommunications were attempted between this nurse and patient, however failed, due to patient having technical difficulties OR patient did not have access to video capability.  We continued and completed visit with audio only.  Some vital signs may be absent or patient reported.   Dionisio David, LPN  Subjective:   Gabriela [name] is a 88 y.o. female who presents for Medicare Annual (Subsequent) preventive examination.  Review of Systems           Objective:    There were no vitals filed for this visit. There is no height or weight on file to calculate BMI.  Advanced Directives 04/04/2021 02/10/2021 12/22/2020 12/08/2020 12/08/2020 10/20/2020 10/19/2020  Does Patient Have a Medical Advance Directive? No No No No No No Yes  Would patient like information on creating a medical advance directive? - No - Patient declined - - - No - Patient declined -  Pre-existing out of facility DNR order (yellow form or pink MOST form) - - - - - - -    Current Medications (verified) Outpatient Encounter Medications as of 09/07/2021  Medication Sig   Accu-Chek FastClix Lancets MISC To check blood sugars once a day. DX E11.9   albuterol (VENTOLIN HFA) 108 (90 Base) MCG/ACT inhaler INHALE 1-2 PUFFS INTO THE LUNGS EVERY 4 HOURS AS NEEDED FOR WHEEZING OR SHORTNESS OF BREATH.   aspirin EC 81 MG tablet Take 81 mg by mouth daily.   B-D UF III MINI  PEN NEEDLES 31G X 5 MM MISC USE WITH INSULIN DOSES   benzonatate (TESSALON) 100 MG capsule Take 1 capsule (100 mg total) by mouth 3 (three) times daily as needed for cough.   Blood Glucose Monitoring Suppl (ACCU-CHEK AVIVA PLUS) w/Device KIT 1 each by Does not apply route daily. Dx E11.9   ferrous sulfate 325 (65 FE) MG tablet TAKE 1 TABLET BY MOUTH EVERY OTHER DAY   furosemide (LASIX) 20 MG tablet TAKE 1 TABLET BY MOUTH EVERY DAY   glucose blood (ACCU-CHEK AVIVA PLUS) test strip USE TO TEST SUGAR TWICE A DAY   hydrochlorothiazide (HYDRODIURIL) 25 MG tablet TAKE 1 TABLET (25 MG TOTAL) BY MOUTH DAILY.   metoprolol tartrate (LOPRESSOR) 25 MG tablet TAKE 1/2 TABLET BY MOUTH EVERY DAY   ondansetron (ZOFRAN ODT) 4 MG disintegrating tablet Take 1 tablet (4 mg total) by mouth every 8 (eight) hours as needed.   timolol (TIMOPTIC) 0.5 % ophthalmic solution Place 1 drop into both eyes every morning.   No facility-administered encounter medications on file as of 09/07/2021.    Allergies (verified) Sulfa antibiotics   History: Past Medical History:  Diagnosis Date   Arthritis    Diabetes mellitus ([place])    Glaucoma    Hyperlipidemia    Hypertension    Shortness of breath    with exertion   Past Surgical History:  Procedure Laterality Date   CARDIAC CATHETERIZATION  04/22/13   CHOLECYSTECTOMY     CORONARY ARTERY BYPASS GRAFT  N/A 05/31/2013   Procedure: Coronary Artery Bypass Grafting Times Three Using Left Internal Mammary Artery and Right Saphenous Leg Vein Harvested Endoscopically;  Surgeon: Ivin Poot, MD;  Location: [place];  Service: Open Heart Surgery;  Laterality: N/A;   ERCP N/A 10/20/2020   Procedure: ENDOSCOPIC RETROGRADE CHOLANGIOPANCREATOGRAPHY (ERCP);  Surgeon: Lucilla Lame, MD;  [facility] ENDOSCOPY;  Service: Endoscopy;  Laterality: N/A;   ERCP N/A 12/22/2020   Procedure: ENDOSCOPIC RETROGRADE CHOLANGIOPANCREATOGRAPHY (ERCP);  Surgeon: Lucilla Lame, MD;  Location: [facility]  ENDOSCOPY;  Service: Endoscopy;  Laterality: N/A;   EYE SURGERY     HIP SURGERY     right   INTRAOPERATIVE TRANSESOPHAGEAL ECHOCARDIOGRAM N/A 05/31/2013   Procedure: INTRAOPERATIVE TRANSESOPHAGEAL ECHOCARDIOGRAM;  Surgeon: Ivin Poot, MD;  Location: [place];  Service: Open Heart Surgery;  Laterality: N/A;   JOINT REPLACEMENT     MITRAL VALVE REPAIR N/A 05/31/2013   Procedure: MITRAL VALVE REPAIR (MVR);  Surgeon: Ivin Poot, MD;  Location: [place];  Service: Open Heart Surgery;  Laterality: N/A;   TONSILLECTOMY     Family History  Problem Relation Age of Onset   Diabetes Sister    Hypertension Mother    Social History   Socioeconomic History   Marital status: Widowed    Spouse name: Not on file   Number of children: 7   Years of education: Not on file   Highest education level: 11th grade  Occupational History   Occupation: retired  Tobacco Use   Smoking status: Never   Smokeless tobacco: Never  Vaping Use   Vaping Use: Never used  Substance and Sexual Activity   Alcohol use: No   Drug use: No   Sexual activity: Never  Other Topics Concern   Not on file  Social History Narrative   Not on file   Social Determinants of Health   Financial Resource Strain: Not on file  Food Insecurity: Not on file  Transportation Needs: Not on file  Physical Activity: Not on file  Stress: Not on file  Social Connections: Not on file    Tobacco Counseling Counseling given: Not Answered   Clinical Intake:  Pre-visit preparation completed: Yes  Pain : No/denies pain     Nutritional Risks: None Diabetes: Yes CBG done?: No Did pt. bring in CBG monitor from home?: No  How often do you need to have someone help you when you read instructions, pamphlets, or other written materials from your doctor or pharmacy?: 1 - Never  Diabetic?yes Nutrition Risk Assessment:  Has the patient had any N/V/D within the last 2 months?  No  Does the patient have any non-healing wounds?   No  Has the patient had any unintentional weight loss or weight gain?  No   Diabetes:  Is the patient diabetic?  Yes  If diabetic, was a CBG obtained today?  No  Did the patient bring in their glucometer from home?  No  How often do you monitor your CBG's? Every day.   Financial Strains and Diabetes Management:  Are you having any financial strains with the device, your supplies or your medication? No .  Does the patient want to be seen by Chronic Care Management for management of their diabetes?  No  Would the patient like to be referred to a Nutritionist or for Diabetic Management?  No   Diabetic Exams:  Diabetic Eye Exam: Completed no. Overdue for diabetic eye exam. Pt has been advised about the importance in completing this  exam.  Diabetic Foot Exam: Completed no. Pt has been advised about the importance in completing this exam.  Interpreter Needed?: No  Information entered by :: Kirke Shaggy, LPN   Activities of Daily Living In your present state of health, do you have any difficulty performing the following activities: 12/14/2020 10/20/2020  Hearing? Y N  Vision? Y N  Difficulty concentrating or making decisions? N N  Walking or climbing stairs? Y N  Dressing or bathing? N N  Doing errands, shopping? N N  Some recent data might be hidden    Patient Care Team: Jerrol Banana., MD as PCP - General (Family Medicine) Corey Skains, MD as Referring Physician (Internal Medicine) Lorelee Cover., MD (Ophthalmology)  Indicate any recent Medical Services you may have received from other than [facility] providers in the past year (date may be approximate).     Assessment:   This is a routine wellness examination for [place].  Hearing/Vision screen No results found.  Dietary issues and exercise activities discussed:     Goals Addressed   None    Depression Screen PHQ 2/9 Scores 12/14/2020 03/10/2020 03/29/2018 06/13/2017 05/09/2016 05/06/2015  PHQ - 2 Score 0 0 0 0 0  0  PHQ- 9 Score 1 - - - - -    Fall Risk Fall Risk  12/14/2020 03/10/2020 03/29/2018 06/13/2017 05/09/2016  Falls in the past year? 1 0 No No Yes  Number falls in past yr: 1 0 - - 1  Injury with Fall? 1 0 - - Yes  Risk for fall due to : Impaired mobility;Impaired balance/gait;Impaired vision;History of fall(s);Orthopedic patient - - - -  Follow up Falls evaluation completed - - - Falls evaluation completed    FALL RISK PREVENTION PERTAINING TO THE HOME:  Any stairs in or around the home? No  If so, are there any without handrails? No  Home free of loose throw rugs in walkways, pet beds, electrical cords, etc? Yes  Adequate lighting in your home to reduce risk of falls? Yes   ASSISTIVE DEVICES UTILIZED TO PREVENT FALLS:  Life alert? No  Use of a cane, walker or w/c? Yes  Grab bars in the bathroom? Yes  Shower chair or bench in shower? Yes  Elevated toilet seat or a handicapped toilet? Yes    Cognitive Function:Normal cognitive status assessed by direct observation by this Nurse Health Advisor. No abnormalities found.       6CIT Screen 03/10/2020 05/09/2016  What Year? 0 points 0 points  What month? 0 points 0 points  What time? 0 points 0 points  Count back from 20 0 points 0 points  Months in reverse 2 points 4 points  Repeat phrase 6 points 8 points  Total Score 8 12    Immunizations Immunization History  Administered Date(s) Administered   Fluad Quad(high Dose 65+) 07/28/2020   Influenza, High Dose Seasonal PF 05/06/2015, 05/09/2016, 06/13/2017   PFIZER(Purple Top)SARS-COV-2 Vaccination 10/09/2019, 10/30/2019, 05/29/2020   Pneumococcal Conjugate-13 03/11/2014   Pneumococcal Polysaccharide-23 07/27/2005   Td 11/10/1994   Tdap 03/05/2013   Zoster, Live 03/05/2013    TDAP status: Up to date  Flu Vaccine status: Up to date  Pneumococcal vaccine status: Up to date  Covid-19 vaccine status: Completed vaccines  Qualifies for Shingles Vaccine? Yes   Zostavax  completed Yes   Shingrix Completed?: No.    Education has been provided regarding the importance of this vaccine. Patient has been advised to call insurance company to  determine out of pocket expense if they have not yet received this vaccine. Advised may also receive vaccine at local pharmacy or Health Dept. Verbalized acceptance and understanding.  Screening Tests Health Maintenance  Topic Date Due   FOOT EXAM  Never done   OPHTHALMOLOGY EXAM  Never done   URINE MICROALBUMIN  Never done   Zoster Vaccines- Shingrix (1 of 2) Never done   DEXA SCAN  09/14/2007   COVID-19 Vaccine (4 - Booster for Pfizer series) 07/24/2020   INFLUENZA VACCINE  03/15/2021   HEMOGLOBIN A1C  09/25/2021   TETANUS/TDAP  03/06/2023   Pneumonia Vaccine 53+ Years old  Completed   HPV VACCINES  Aged Out    Health Maintenance  Health Maintenance Due  Topic Date Due   FOOT EXAM  Never done   OPHTHALMOLOGY EXAM  Never done   URINE MICROALBUMIN  Never done   Zoster Vaccines- Shingrix (1 of 2) Never done   DEXA SCAN  09/14/2007   COVID-19 Vaccine (4 - Booster for Pfizer series) 07/24/2020   INFLUENZA VACCINE  03/15/2021    Colorectal cancer screening: No longer required.   Mammogram status: No longer required due to age.   Lung Cancer Screening: (Low Dose CT Chest recommended if Age 17-80 years, 30 pack-year currently smoking OR have quit w/in 15years.) does not qualify.   Additional Screening:  Hepatitis C Screening: does not qualify; Completed no  Vision Screening: Recommended annual ophthalmology exams for early detection of glaucoma and other disorders of the eye. Is the patient up to date with their annual eye exam?  Yes  Who is the provider or what is the name of the office in which the patient attends annual eye exams? Dr. Gloriann Loan If pt is not established with a provider, would they like to be referred to a provider to establish care? No .   Dental Screening: Recommended annual dental exams for  proper oral hygiene  Community Resource Referral / Chronic Care Management: CRR required this visit?  No   CCM required this visit?  No      Plan:     I have personally reviewed and noted the following in the patients chart:   Medical and social history Use of alcohol, tobacco or illicit drugs  Current medications and supplements including opioid prescriptions.  Functional ability and status Nutritional status Physical activity Advanced directives List of other physicians Hospitalizations, surgeries, and ER visits in previous 12 months Vitals Screenings to include cognitive, depression, and falls Referrals and appointments  In addition, I have reviewed and discussed with patient certain preventive protocols, quality metrics, and best practice recommendations. A written personalized care plan for preventive services as well as general preventive health recommendations were provided to patient.     Dionisio David, LPN   [id number]   Nurse Notes: none

## 2021-10-10 ENCOUNTER — Other Ambulatory Visit: Payer: Self-pay | Admitting: Family Medicine

## 2021-10-10 DIAGNOSIS — R059 Cough, unspecified: Secondary | ICD-10-CM

## 2021-10-12 NOTE — Telephone Encounter (Signed)
Requested Prescriptions  Pending Prescriptions Disp Refills   albuterol (VENTOLIN HFA) 108 (90 Base) MCG/ACT inhaler [Pharmacy Med Name: ALBUTEROL HFA (PROVENTIL) INH] 6.7 each 1    Sig: INHALE 1-2 PUFFS INTO THE LUNGS EVERY 4 HOURS AS NEEDED FOR WHEEZING OR SHORTNESS OF BREATH.     Pulmonology:  Beta Agonists 2 Passed - 10/10/2021  9:52 AM      Passed - Last BP in normal range    BP Readings from Last 1 Encounters:  06/28/21 (!) 136/58         Passed - Last Heart Rate in normal range    Pulse Readings from Last 1 Encounters:  06/28/21 65         Passed - Valid encounter within last 12 months    Recent Outpatient Visits          6 months ago Type 2 diabetes mellitus with other circulatory complication, unspecified whether long term insulin use ([place])   [facility] Jerrol Banana., MD   10 months ago Laceration of head without complication, initial encounter   [facility] Jerrol Banana., MD   11 months ago Choledocholithiasis   [facility] Jerrol Banana[facility] MD   1 year ago Type 2 diabetes mellitus with other circulatory complication, unspecified whether long term insulin use [facility])   [facility] Jerrol Banana., MD   1 year ago Type 2 diabetes mellitus with other circulatory complication, unspecified whether long term insulin use [facility])   [facility] Jerrol Banana., MD      Future Appointments            In 2 months Jerrol Banana., MD [facility]

## 2021-10-18 ENCOUNTER — Other Ambulatory Visit: Payer: Self-pay | Admitting: *Deleted

## 2021-10-18 DIAGNOSIS — D472 Monoclonal gammopathy: Secondary | ICD-10-CM

## 2021-10-18 DIAGNOSIS — D631 Anemia in chronic kidney disease: Secondary | ICD-10-CM

## 2021-10-20 ENCOUNTER — Telehealth: Payer: Self-pay | Admitting: *Deleted

## 2021-10-20 NOTE — Telephone Encounter (Signed)
Patient needs to reschedule appointment. ?

## 2021-10-20 NOTE — Telephone Encounter (Signed)
10/20/2021 ?Spoke w/ pt and her granddaughter about upcoming appts. Said they would like to cxl for now and will r/s after she sees her PCP (Dr. Rosanna Randy @ [facility]). Appts cxl at this time  ?SRW  ?

## 2021-10-25 ENCOUNTER — Other Ambulatory Visit: Payer: Medicare Other

## 2021-10-26 ENCOUNTER — Other Ambulatory Visit: Payer: Self-pay | Admitting: Family Medicine

## 2021-10-26 NOTE — Telephone Encounter (Signed)
Requested medication (s) are due for refill today: yes ? ?Requested medication (s) are on the active medication list: yes ? ?Last refill:  08/12/21 #90/0 ? ?Future visit scheduled: yes ? ?Notes to clinic:  Unable to refill per protocol due to failed labs, no updated results. ? ? ? ?  ?Requested Prescriptions  ?Pending Prescriptions Disp Refills  ? furosemide (LASIX) 20 MG tablet [Pharmacy Med Name: FUROSEMIDE 20 MG TABLET] 90 tablet 0  ?  Sig: TAKE 1 TABLET BY MOUTH EVERY DAY  ?  ? Cardiovascular:  Diuretics - Loop Failed - 10/26/2021  2:07 PM  ?  ?  Failed - K in normal range and within 180 days  ?  Potassium  ?Date Value Ref Range Status  ?04/04/2021 5.3 (H) 3.5 - 5.1 mmol/L Final  ?  ?  ?  ?  Failed - Ca in normal range and within 180 days  ?  Calcium  ?Date Value Ref Range Status  ?04/04/2021 8.6 (L) 8.9 - 10.3 mg/dL Final  ? ?Calcium, Ion  ?Date Value Ref Range Status  ?06/01/2013 1.28 1.13 - 1.30 mmol/L Final  ?  ?  ?  ?  Failed - Na in normal range and within 180 days  ?  Sodium  ?Date Value Ref Range Status  ?04/04/2021 135 135 - 145 mmol/L Final  ?10/28/2020 139 134 - 144 mmol/L Final  ?  ?  ?  ?  Failed - Cr in normal range and within 180 days  ?  Creat  ?Date Value Ref Range Status  ?06/13/2017 1.33 (H) 0.60 - 0.88 mg/dL Final  ?  Comment:  ?  For patients >22 years of age, the reference limit ?for Creatinine is approximately 13% higher for people ?identified as African-American. ?. ?  ? ?Creatinine, Ser  ?Date Value Ref Range Status  ?04/04/2021 2.03 (H) 0.44 - 1.00 mg/dL Final  ?  ?  ?  ?  Failed - Cl in normal range and within 180 days  ?  Chloride  ?Date Value Ref Range Status  ?04/04/2021 103 98 - 111 mmol/L Final  ?  ?  ?  ?  Failed - Mg Level in normal range and within 180 days  ?  Magnesium  ?Date Value Ref Range Status  ?10/22/2020 1.9 1.7 - 2.4 mg/dL Final  ?  Comment:  ?  Performed at [facility], [address] [place], [place] [address]  ?  ?  ?  ?  Passed - Last BP in normal range   ?  BP Readings from Last 1 Encounters:  ?06/28/21 (!) 136/58  ?  ?  ?  ?  Passed - Valid encounter within last 6 months  ?  Recent Outpatient Visits   ? ?      ? 7 months ago Type 2 diabetes mellitus with other circulatory complication, unspecified whether long term insulin use ([place])  ? [facility] Jerrol Banana., MD  ? 10 months ago Laceration of head without complication, initial encounter  ? [facility] Jerrol Banana., MD  ? 12 months ago Choledocholithiasis  ? [facility] Jerrol Banana., MD  ? 1 year ago Type 2 diabetes mellitus with other circulatory complication, unspecified whether long term insulin use ([place])  ? [facility] Jerrol Banana., MD  ? 1 year ago Type 2 diabetes mellitus with other circulatory complication, unspecified whether long term insulin use ([place])  ? [facility] Jerrol Banana., MD  ? ?  ?  ?Future Appointments   ? ?        ? In 1 month Jerrol Banana., MD [facility] [place]  ? ?  ? ?  ?  ?  ? ?

## 2021-11-01 ENCOUNTER — Ambulatory Visit: Payer: Medicare Other | Admitting: Nurse Practitioner

## 2021-11-08 ENCOUNTER — Other Ambulatory Visit: Payer: Self-pay | Admitting: Family Medicine

## 2021-11-09 ENCOUNTER — Ambulatory Visit: Payer: Medicare Other | Admitting: Family Medicine

## 2021-11-16 ENCOUNTER — Other Ambulatory Visit: Payer: Self-pay | Admitting: Physician Assistant

## 2021-11-16 DIAGNOSIS — I10 Essential (primary) hypertension: Secondary | ICD-10-CM

## 2021-11-17 NOTE — Telephone Encounter (Signed)
Requested Prescriptions  ?Pending Prescriptions Disp Refills  ?? metoprolol tartrate (LOPRESSOR) 25 MG tablet [Pharmacy Med Name: METOPROLOL TARTRATE 25 MG TAB] 45 tablet 0  ?  Sig: TAKE 1/2 TABLET BY MOUTH EVERY DAY  ?  ? Cardiovascular:  Beta Blockers Passed - 11/16/2021 10:57 AM  ?  ?  Passed - Last BP in normal range  ?  BP Readings from Last 1 Encounters:  ?06/28/21 (!) 136/58  ?   ?  ?  Passed - Last Heart Rate in normal range  ?  Pulse Readings from Last 1 Encounters:  ?06/28/21 65  ?   ?  ?  Passed - Valid encounter within last 6 months  ?  Recent Outpatient Visits   ?      ? 7 months ago Type 2 diabetes mellitus with other circulatory complication, unspecified whether long term insulin use ([place])  ? [facility] Jerrol Banana., MD  ? 11 months ago Laceration of head without complication, initial encounter  ? [facility] Jerrol Banana., MD  ? 1 year ago Choledocholithiasis  ? [facility] Jerrol Banana., MD  ? 1 year ago Type 2 diabetes mellitus with other circulatory complication, unspecified whether long term insulin use ([place])  ? [facility] Jerrol Banana., MD  ? 1 year ago Type 2 diabetes mellitus with other circulatory complication, unspecified whether long term insulin use ([place])  ? [facility] Jerrol Banana., MD  ?  ?  ?Future Appointments   ?        ? In 3 weeks Jerrol Banana., MD [facility]  ?  ? ?  ?  ?  ? ?

## 2021-12-06 DIAGNOSIS — H40153 Residual stage of open-angle glaucoma, bilateral: Secondary | ICD-10-CM | POA: Diagnosis not present

## 2021-12-10 ENCOUNTER — Other Ambulatory Visit: Payer: Self-pay | Admitting: Family Medicine

## 2021-12-13 ENCOUNTER — Ambulatory Visit: Payer: Medicare Other | Admitting: Family Medicine

## 2021-12-13 NOTE — Progress Notes (Deleted)
Argentina Ponder Dequante Tremaine,acting as a scribe for Wilhemena Durie, MD.,have documented all relevant documentation on the behalf of Wilhemena Durie, MD,as directed by  Wilhemena Durie, MD while in the presence of Wilhemena Durie, MD.     Established patient visit   [name]   DOB: September 01, 1925   88 y.o. Female  MRN: [id number] Visit Date: 12/13/2021  Today's healthcare provider: Wilhemena Durie, MD   No chief complaint on file.  Subjective    HPI  Diabetes Mellitus Type II, Follow-up  Lab Results  Component Value Date   HGBA1C 6.2 (A) 03/25/2021   HGBA1C 6.6 (H) 10/19/2020   HGBA1C 6.6 (A) 07/28/2020   Wt Readings from Last 3 Encounters:  04/04/21 126 lb (57.2 kg)  03/25/21 126 lb 9.6 oz (57.4 kg)  02/10/21 126 lb (57.2 kg)   Last seen for diabetes {1-12:18279} {days/wks/mos/yrs:310907} ago.  Management since then includes ***. She reports {excellent/good/fair/poor:19665} compliance with treatment. She {is/is not:21021397} having side effects. {document side effects if present:1} Symptoms: {Yes/No:20286} fatigue {Yes/No:20286} foot ulcerations  {Yes/No:20286} appetite changes {Yes/No:20286} nausea  {Yes/No:20286} paresthesia of the feet  {Yes/No:20286} polydipsia  {Yes/No:20286} polyuria {Yes/No:20286} visual disturbances   {Yes/No:20286} vomiting     Home blood sugar records: {diabetes glucometry results:16657}  Episodes of hypoglycemia? {Yes/No:20286} {enter symptoms and frequency of symptoms if yes:1}   Current insulin regiment: {enter 'none' or type of insulin and number of units taken with each dose of each insulin formulation that the patient is taking:1} Most Recent Eye Exam: *** {Current exercise:16438:::1} {Current diet habits:16563:::1}  Pertinent Labs: Lab Results  Component Value Date   CHOL 70 10/20/2020   HDL 15 (L) 10/20/2020   LDLCALC 34 10/20/2020   TRIG 106 10/20/2020   CHOLHDL 4.7 10/20/2020   Lab Results  Component  Value Date   NA 135 04/04/2021   K 5.3 (H) 04/04/2021   CREATININE 2.03 (H) 04/04/2021   GFRNONAA 23 (L) 04/04/2021     ---------------------------------------------------------------------------------------------------   Medications: Outpatient Medications Prior to Visit  Medication Sig   Accu-Chek FastClix Lancets MISC To check blood sugars once a day. DX E11.9   albuterol (VENTOLIN HFA) 108 (90 Base) MCG/ACT inhaler INHALE 1-2 PUFFS INTO THE LUNGS EVERY 4 HOURS AS NEEDED FOR WHEEZING OR SHORTNESS OF BREATH.   aspirin EC 81 MG tablet Take 81 mg by mouth daily.   B-D UF III MINI PEN NEEDLES 31G X 5 MM MISC USE WITH INSULIN DOSES   benzonatate (TESSALON) 100 MG capsule Take 1 capsule (100 mg total) by mouth 3 (three) times daily as needed for cough.   Blood Glucose Monitoring Suppl (ACCU-CHEK AVIVA PLUS) w/Device KIT 1 each by Does not apply route daily. Dx E11.9   ferrous sulfate 325 (65 FE) MG tablet TAKE 1 TABLET BY MOUTH EVERY OTHER DAY (Patient not taking: Reported on 09/07/2021)   furosemide (LASIX) 20 MG tablet TAKE 1 TABLET BY MOUTH EVERY DAY   glucose blood (ACCU-CHEK AVIVA PLUS) test strip USE AS INSTRUCTED DX E11.9   hydrochlorothiazide (HYDRODIURIL) 25 MG tablet TAKE 1 TABLET (25 MG TOTAL) BY MOUTH DAILY.   LEVEMIR FLEXTOUCH 100 UNIT/ML FlexTouch Pen INJECT 15 UNITS UNDER THE SKIN EVERY DAY IN AFTERNOON   metoprolol tartrate (LOPRESSOR) 25 MG tablet TAKE 1/2 TABLET BY MOUTH EVERY DAY   ondansetron (ZOFRAN ODT) 4 MG disintegrating tablet Take 1 tablet (4 mg total) by mouth every 8 (eight) hours as needed.   timolol (TIMOPTIC) 0.5 %  ophthalmic solution Place 1 drop into both eyes every morning.   No facility-administered medications prior to visit.    Review of Systems  {Labs  Heme  Chem  Endocrine  Serology  Results Review (optional):23779}   Objective    There were no vitals taken for this visit. {Show previous vital signs (optional):23777}  Physical Exam   ***  No results found for any visits on 12/13/21.  Assessment & Plan     ***  No follow-ups on file.      {provider attestation***:1}   Wilhemena Durie, MD  [facility] [phone] (phone) [phone] (fax)  [place]

## 2021-12-27 ENCOUNTER — Encounter: Payer: Self-pay | Admitting: Family Medicine

## 2021-12-27 ENCOUNTER — Ambulatory Visit (INDEPENDENT_AMBULATORY_CARE_PROVIDER_SITE_OTHER): Payer: Medicare Other | Admitting: Family Medicine

## 2021-12-27 VITALS — BP 120/61 | HR 65 | Temp 98.2°F | Resp 16 | Ht 62.0 in | Wt 117.5 lb

## 2021-12-27 DIAGNOSIS — E119 Type 2 diabetes mellitus without complications: Secondary | ICD-10-CM | POA: Diagnosis not present

## 2021-12-27 DIAGNOSIS — E1159 Type 2 diabetes mellitus with other circulatory complications: Secondary | ICD-10-CM | POA: Diagnosis not present

## 2021-12-27 DIAGNOSIS — D649 Anemia, unspecified: Secondary | ICD-10-CM | POA: Diagnosis not present

## 2021-12-27 DIAGNOSIS — H409 Unspecified glaucoma: Secondary | ICD-10-CM

## 2021-12-27 DIAGNOSIS — E1121 Type 2 diabetes mellitus with diabetic nephropathy: Secondary | ICD-10-CM

## 2021-12-27 DIAGNOSIS — I10 Essential (primary) hypertension: Secondary | ICD-10-CM

## 2021-12-27 DIAGNOSIS — I2510 Atherosclerotic heart disease of native coronary artery without angina pectoris: Secondary | ICD-10-CM | POA: Diagnosis not present

## 2021-12-27 DIAGNOSIS — I340 Nonrheumatic mitral (valve) insufficiency: Secondary | ICD-10-CM

## 2021-12-27 DIAGNOSIS — E7800 Pure hypercholesterolemia, unspecified: Secondary | ICD-10-CM

## 2021-12-27 NOTE — Progress Notes (Signed)
Established patient visit   [name]   DOB: May 04, 1926   88 y.o. Female  MRN: [id number] Visit Date: 12/27/2021  Today's healthcare provider: Wilhemena Durie, MD    I,Tiffany Darlina Sicilian as a scribe for Wilhemena Durie, MD.,have documented all relevant documentation on the behalf of Wilhemena Durie, MD,as directed by  Wilhemena Durie, MD while in the presence of Wilhemena Durie, MD.   Chief Complaint  Patient presents with   Diabetes   Subjective    HPI  She comes in today for follow-up.  She used to do well and has no complaints.  No hypoglycemia.  No falls.  No syncope or chest pain.  Does have some rhinorrhea over recent months. Medication and she is tolerating this well.  Family is with her and ask about her anemia.  Basically they do not want to do anything about it.  He is asymptomatic is felt to be anemia of chronic disease/CKD  Diabetes Mellitus Type II, Follow-up  Lab Results  Component Value Date   HGBA1C 6.2 (A) 03/25/2021   HGBA1C 6.6 (H) 10/19/2020   HGBA1C 6.6 (A) 07/28/2020   Wt Readings from Last 3 Encounters:  04/04/21 126 lb (57.2 kg)  03/25/21 126 lb 9.6 oz (57.4 kg)  02/10/21 126 lb (57.2 kg)   Last seen for diabetes 9 months ago.  Management since then includes decreasing of insulin.  Symptoms: No fatigue No foot ulcerations  No appetite changes No nausea  No paresthesia of the feet  No polydipsia  No polyuria No visual disturbances   No vomiting     Home blood sugar records: between 120-150  Episodes of hypoglycemia? No    Current insulin regiment:  Most Recent Eye Exam: 05/01/02023 Current exercise: none Current diet habits: general  Pertinent Labs: Lab Results  Component Value Date   CHOL 70 10/20/2020   HDL 15 (L) 10/20/2020   LDLCALC 34 10/20/2020   TRIG 106 10/20/2020   CHOLHDL 4.7 10/20/2020   Lab Results  Component Value Date   NA 135 04/04/2021   K 5.3 (H) 04/04/2021   CREATININE  2.03 (H) 04/04/2021   GFRNONAA 23 (L) 04/04/2021     ---------------------------------------------------------------------------------------------------  Medications: Outpatient Medications Prior to Visit  Medication Sig   Accu-Chek FastClix Lancets MISC To check blood sugars once a day. DX E11.9   albuterol (VENTOLIN HFA) 108 (90 Base) MCG/ACT inhaler INHALE 1-2 PUFFS INTO THE LUNGS EVERY 4 HOURS AS NEEDED FOR WHEEZING OR SHORTNESS OF BREATH.   aspirin EC 81 MG tablet Take 81 mg by mouth daily.   B-D UF III MINI PEN NEEDLES 31G X 5 MM MISC USE WITH INSULIN DOSES   benzonatate (TESSALON) 100 MG capsule Take 1 capsule (100 mg total) by mouth 3 (three) times daily as needed for cough.   Blood Glucose Monitoring Suppl (ACCU-CHEK AVIVA PLUS) w/Device KIT 1 each by Does not apply route daily. Dx E11.9   ferrous sulfate 325 (65 FE) MG tablet TAKE 1 TABLET BY MOUTH EVERY OTHER DAY   furosemide (LASIX) 20 MG tablet TAKE 1 TABLET BY MOUTH EVERY DAY   glucose blood (ACCU-CHEK AVIVA PLUS) test strip USE AS INSTRUCTED DX E11.9   hydrochlorothiazide (HYDRODIURIL) 25 MG tablet TAKE 1 TABLET (25 MG TOTAL) BY MOUTH DAILY.   LEVEMIR FLEXTOUCH 100 UNIT/ML FlexTouch Pen INJECT 15 UNITS UNDER THE SKIN EVERY DAY IN AFTERNOON   metoprolol tartrate (LOPRESSOR) 25 MG tablet  TAKE 1/2 TABLET BY MOUTH EVERY DAY   ondansetron (ZOFRAN ODT) 4 MG disintegrating tablet Take 1 tablet (4 mg total) by mouth every 8 (eight) hours as needed.   SIMBRINZA 1-0.2 % SUSP Apply 1 drop to eye 2 (two) times daily.   timolol (TIMOPTIC) 0.5 % ophthalmic solution Apply to eye.   [DISCONTINUED] timolol (TIMOPTIC) 0.5 % ophthalmic solution Place 1 drop into both eyes every morning.   No facility-administered medications prior to visit.    Review of Systems  Last CBC Lab Results  Component Value Date   WBC 5.9 12/27/2021   HGB 7.4 (L) 12/27/2021   HCT 23.4 (L) 12/27/2021   MCV 77 (L) 12/27/2021   MCH 24.3 (L) 12/27/2021   RDW  16.6 (H) 12/27/2021   PLT 94 (LL) 12/27/2021       Objective    There were no vitals taken for this visit. BP Readings from Last 3 Encounters:  12/27/21 120/61  06/28/21 (!) 136/58  04/04/21 (!) 157/65   Wt Readings from Last 3 Encounters:  12/27/21 117 lb 8 oz (53.3 kg)  04/04/21 126 lb (57.2 kg)  03/25/21 126 lb 9.6 oz (57.4 kg)      Physical Exam Vitals and nursing note reviewed.  Constitutional:      Appearance: Normal appearance. She is normal weight.  HENT:     Right Ear: Tympanic membrane normal.     Left Ear: Tympanic membrane normal.     Nose: Nose normal.     Mouth/Throat:     Mouth: Mucous membranes are moist.     Pharynx: Oropharynx is clear.  Eyes:     Conjunctiva/sclera: Conjunctivae normal.  Cardiovascular:     Rate and Rhythm: Normal rate and regular rhythm.     Pulses: Normal pulses.     Heart sounds: Normal heart sounds.  Pulmonary:     Effort: Pulmonary effort is normal.     Breath sounds: Normal breath sounds.  Abdominal:     General: Bowel sounds are normal.     Palpations: Abdomen is soft.  Musculoskeletal:     Cervical back: Normal range of motion and neck supple.     Right lower leg: No edema.     Left lower leg: No edema.  Skin:    General: Skin is warm and dry.  Neurological:     General: No focal deficit present.     Mental Status: She is alert and oriented to person, place, and time.  Psychiatric:        Mood and Affect: Mood normal.        Behavior: Behavior normal.        Thought Content: Thought content normal.        Judgment: Judgment normal.      No results found for any visits on 12/27/21.  Assessment & Plan     .1. Diabetes mellitus with chronic kidney disease No A1c less than 103 and 88 year old - Comprehensive metabolic panel - Hemoglobin A1c  2. Chronic anemia Treatment in this asymptomatic patient.  If it gets below 7 may require transfusion. - CBC with Differential/Platelet  3. Ischemic mitral  regurgitation   4. Essential (primary) hypertension Good blood pressure control.  5. Atherosclerosis of native coronary artery of native heart without angina pectoris All risk factors treated  6. Diabetic nephropathy associated with type 2 diabetes mellitus [facility]) Patient wishes to not see nephrology  7. Type 2 diabetes mellitus with other circulatory complication, unspecified whether long  term insulin use ([place])   8. Pure hypercholesterolemia Not on statin.  Last LDL was 71.   No follow-ups on file.      I, Wilhemena Durie, MD, have reviewed all documentation for this visit. The documentation on 12/31/21 for the exam, diagnosis, procedures, and orders are all accurate and complete.    Dezirea Mccollister Cranford Mon, MD  [facility] [phone] (phone) [phone] (fax)  [place]

## 2021-12-28 LAB — COMPREHENSIVE METABOLIC PANEL
ALT: 6 IU/L (ref 0–32)
AST: 15 IU/L (ref 0–40)
Albumin/Globulin Ratio: 0.9 — ABNORMAL LOW (ref 1.2–2.2)
Albumin: 4 g/dL (ref 3.5–4.6)
Alkaline Phosphatase: 119 IU/L (ref 44–121)
BUN/Creatinine Ratio: 34 — ABNORMAL HIGH (ref 12–28)
BUN: 55 mg/dL — ABNORMAL HIGH (ref 10–36)
Bilirubin Total: 0.5 mg/dL (ref 0.0–1.2)
CO2: 19 mmol/L — ABNORMAL LOW (ref 20–29)
Calcium: 8.5 mg/dL — ABNORMAL LOW (ref 8.7–10.3)
Chloride: 101 mmol/L (ref 96–106)
Creatinine, Ser: 1.64 mg/dL — ABNORMAL HIGH (ref 0.57–1.00)
Globulin, Total: 4.6 g/dL — ABNORMAL HIGH (ref 1.5–4.5)
Glucose: 106 mg/dL — ABNORMAL HIGH (ref 70–99)
Potassium: 4.2 mmol/L (ref 3.5–5.2)
Sodium: 139 mmol/L (ref 134–144)
Total Protein: 8.6 g/dL — ABNORMAL HIGH (ref 6.0–8.5)
eGFR: 29 mL/min/{1.73_m2} — ABNORMAL LOW (ref >59)

## 2021-12-28 LAB — HEMOGLOBIN A1C
Est. average glucose Bld gHb Est-mCnc: 131 mg/dL
Hgb A1c MFr Bld: 6.2 % — ABNORMAL HIGH (ref 4.8–5.6)

## 2021-12-28 LAB — CBC WITH DIFFERENTIAL/PLATELET
Basophils Absolute: 0 10*3/uL (ref 0.0–0.2)
Basos: 1 %
EOS (ABSOLUTE): 0 10*3/uL (ref 0.0–0.4)
Eos: 0 %
Hematocrit: 23.4 % — ABNORMAL LOW (ref 34.0–46.6)
Hemoglobin: 7.4 g/dL — ABNORMAL LOW (ref 11.1–15.9)
Immature Grans (Abs): 0.1 10*3/uL (ref 0.0–0.1)
Immature Granulocytes: 2 %
Lymphocytes Absolute: 2.2 10*3/uL (ref 0.7–3.1)
Lymphs: 38 %
MCH: 24.3 pg — ABNORMAL LOW (ref 26.6–33.0)
MCHC: 31.6 g/dL (ref 31.5–35.7)
MCV: 77 fL — ABNORMAL LOW (ref 79–97)
Monocytes Absolute: 0.9 10*3/uL (ref 0.1–0.9)
Monocytes: 16 %
Neutrophils Absolute: 2.6 10*3/uL (ref 1.4–7.0)
Neutrophils: 43 %
Platelets: 94 10*3/uL — CL (ref 150–450)
RBC: 3.05 x10E6/uL — ABNORMAL LOW (ref 3.77–5.28)
RDW: 16.6 % — ABNORMAL HIGH (ref 11.7–15.4)
WBC: 5.9 10*3/uL (ref 3.4–10.8)

## 2022-01-04 ENCOUNTER — Telehealth: Payer: Self-pay | Admitting: Family Medicine

## 2022-01-04 DIAGNOSIS — E1159 Type 2 diabetes mellitus with other circulatory complications: Secondary | ICD-10-CM

## 2022-01-04 DIAGNOSIS — D649 Anemia, unspecified: Secondary | ICD-10-CM

## 2022-01-04 MED ORDER — FERROUS SULFATE 325 (65 FE) MG PO TABS: 325.0000 mg | ORAL_TABLET | Freq: Every day | ORAL | 2 refills | Status: DC

## 2022-01-04 NOTE — Telephone Encounter (Signed)
[facility] pharmacy faxed refill request for the following medications:  ferrous sulfate 325 (65 FE) MG tablet    Please advise

## 2022-01-06 ENCOUNTER — Other Ambulatory Visit: Payer: Self-pay | Admitting: Physician Assistant

## 2022-01-07 NOTE — Telephone Encounter (Signed)
Requested Prescriptions  Pending Prescriptions Disp Refills  . hydrochlorothiazide (HYDRODIURIL) 25 MG tablet [Pharmacy Med Name: HYDROCHLOROTHIAZIDE 25 MG TAB] 90 tablet 0    Sig: TAKE 1 TABLET (25 MG TOTAL) BY MOUTH DAILY.     Cardiovascular: Diuretics - Thiazide Failed - 01/06/2022  9:23 AM      Failed - Cr in normal range and within 180 days    Creat  Date Value Ref Range Status  06/13/2017 1.33 (H) 0.60 - 0.88 mg/dL Final    Comment:    For patients >38 years of age, the reference limit for Creatinine is approximately 13% higher for people identified as African-American. .    Creatinine, Ser  Date Value Ref Range Status  12/27/2021 1.64 (H) 0.57 - 1.00 mg/dL Final         Passed - K in normal range and within 180 days    Potassium  Date Value Ref Range Status  12/27/2021 4.2 3.5 - 5.2 mmol/L Final         Passed - Na in normal range and within 180 days    Sodium  Date Value Ref Range Status  12/27/2021 139 134 - 144 mmol/L Final         Passed - Last BP in normal range    BP Readings from Last 1 Encounters:  12/27/21 120/61         Passed - Valid encounter within last 6 months    Recent Outpatient Visits          1 week ago Diabetes mellitus without complication [facility])   [facility] Jerrol Banana., MD   9 months ago Type 2 diabetes mellitus with other circulatory complication, unspecified whether long term insulin use [facility])   [facility] Jerrol Banana., MD   1 year ago Laceration of head without complication, initial encounter   [facility] Jerrol Banana., MD   1 year ago Choledocholithiasis   [facility] Jerrol Banana., MD   1 year ago Type 2 diabetes mellitus with other circulatory complication, unspecified whether long term insulin use [facility])   [facility] Jerrol Banana., MD      Future Appointments            In 5 months Jerrol Banana., MD [facility]

## 2022-02-09 DIAGNOSIS — H903 Sensorineural hearing loss, bilateral: Secondary | ICD-10-CM | POA: Diagnosis not present

## 2022-02-09 DIAGNOSIS — H6123 Impacted cerumen, bilateral: Secondary | ICD-10-CM | POA: Diagnosis not present

## 2022-02-12 ENCOUNTER — Other Ambulatory Visit: Payer: Self-pay | Admitting: Family Medicine

## 2022-03-01 ENCOUNTER — Other Ambulatory Visit: Payer: Self-pay | Admitting: Family Medicine

## 2022-03-02 ENCOUNTER — Encounter: Payer: Self-pay | Admitting: *Deleted

## 2022-03-02 ENCOUNTER — Other Ambulatory Visit: Payer: Self-pay | Admitting: *Deleted

## 2022-03-02 NOTE — Patient Outreach (Signed)
[place] [facility]) Care Management  03/02/2022  [place] [birth year] [phone]  RN Health Coach telephone call to patient.  RN spoke with grand daughter who is POA [employer] compliance verified. RN explained all the services to pat grand daughter that [facility] has to offer. The services of RN, Pharmacy and Social worker. Patient has a nurse that comes everyday. Her bathroom is fully handicap equipped. She is able to afford her medications. Declined services at this time.  [facility] [phone]

## 2022-03-07 ENCOUNTER — Ambulatory Visit: Payer: Self-pay

## 2022-03-07 NOTE — Telephone Encounter (Signed)
  Chief Complaint: Fingertip numbness Symptoms: Numb fingertips Frequency: at least 2 weeks - possibly longer Pertinent Negatives: Patient denies HA, chest pain, one sided weakness Disposition: '[]'$ ED /'[]'$ Urgent Care (no appt availability in office) / '[x]'$ Appointment(In office/virtual)/ '[]'$  [facility]/ '[]'$ Home Care/ '[]'$ Refused Recommended Disposition /'[]'$ [facility]/ '[]'$  Follow-up with PCP Additional Notes: Pt has had fingertip numbness for at least 2 weeks, but no other complaints. PT is currently on vacation in [place] and will not be home until Monday.  Granddaughter will carefully monitor pt and take her to UC/Ed if she sees changes in condition. Appt made for thurs next week.    Summary: Fingertip numbness for a few weeks   Pt's granddaughter [name] (on [place]) stated pt told her she was having numbness in her fingertips and has had it over a few weeks. Pt was in the car during this conversation and this was an extended roadtrip (roughly 3 hours). Pt is diabetic and does do crossword puzzles for several hours per day as well. No other symptoms mentioned. Please advise. Callback [phone]      Reason for Disposition  [1] Numbness or tingling in one or both hands AND [2] is a chronic symptom (recurrent or ongoing AND present > 4 weeks)  Answer Assessment - Initial Assessment Questions 1. SYMPTOM: "What is the main symptom you are concerned about?" (e.g., weakness, numbness)     Numbness in her fingertips 2. ONSET: "When did this start?" (minutes, hours, days; while sleeping)     2 weeks 3. LAST NORMAL: "When was the last time you (the patient) were normal (no symptoms)?"     Unknown 4. PATTERN "Does this come and go, or has it been constant since it started?"  "Is it present now?"     constant 5. CARDIAC SYMPTOMS: "Have you had any of the following symptoms: chest pain, difficulty breathing, palpitations?"     no 6. NEUROLOGIC SYMPTOMS: "Have you had any of the following  symptoms: headache, dizziness, vision loss, double vision, changes in speech, unsteady on your feet?"     Just numb. 7. OTHER SYMPTOMS: "Do you have any other symptoms?"     no 8. PREGNANCY: "Is there any chance you are pregnant?" "When was your last menstrual period?"     na  Protocols used: Neurologic Deficit-A-AH

## 2022-03-17 ENCOUNTER — Ambulatory Visit (INDEPENDENT_AMBULATORY_CARE_PROVIDER_SITE_OTHER): Payer: Medicare Other | Admitting: Family Medicine

## 2022-03-17 ENCOUNTER — Encounter: Payer: Self-pay | Admitting: Family Medicine

## 2022-03-17 VITALS — BP 115/56 | HR 67 | Resp 16

## 2022-03-17 DIAGNOSIS — G5603 Carpal tunnel syndrome, bilateral upper limbs: Secondary | ICD-10-CM

## 2022-03-17 DIAGNOSIS — E1159 Type 2 diabetes mellitus with other circulatory complications: Secondary | ICD-10-CM | POA: Diagnosis not present

## 2022-03-17 NOTE — Progress Notes (Signed)
Established patient visit  I,Gabriela [name],acting as a scribe for Gabriela Durie, MD.,have documented all relevant documentation on the behalf of Gabriela Durie, MD,as directed by  Gabriela Durie, MD while in the presence of Gabriela Durie, MD.   [name]   DOB: Dec 22, 1925   88 y.o. Female  MRN: [id number] Visit Date: 03/17/2022  Today's healthcare provider: Wilhemena Durie, MD   Chief Complaint  Patient presents with   Numbness    In fingertips   Subjective    HPI Patient complains of the tingling in the first second and third fingers of both hands.  She shakes her hands and feels better. She has no other complaints.  She continues to live fairly independently with family She denies any hypoglycemia. She has good appetite  HPI     Numbness    Additional comments: In fingertips      Last edited by Sabra Heck, Gabriela M, CMA on 03/17/2022  4:27 PM.      Patient has had bilateral fingertips numbness for 3 weeks.  Medications: Outpatient Medications Prior to Visit  Medication Sig   Accu-Chek FastClix Lancets MISC To check blood sugars once a day. DX E11.9   albuterol (VENTOLIN HFA) 108 (90 Base) MCG/ACT inhaler INHALE 1-2 PUFFS INTO THE LUNGS EVERY 4 HOURS AS NEEDED FOR WHEEZING OR SHORTNESS OF BREATH.   aspirin EC 81 MG tablet Take 81 mg by mouth daily.   B-D UF III MINI PEN NEEDLES 31G X 5 MM MISC USE WITH INSULIN DOSES   benzonatate (TESSALON) 100 MG capsule Take 1 capsule (100 mg total) by mouth 3 (three) times daily as needed for cough.   Blood Glucose Monitoring Suppl (ACCU-CHEK AVIVA PLUS) w/Device KIT 1 each by Does not apply route daily. Dx E11.9   ferrous sulfate 325 (65 FE) MG tablet Take 1 tablet (325 mg total) by mouth daily with breakfast. TAKE 1 TABLET BY MOUTH DAILY.   furosemide (LASIX) 20 MG tablet TAKE 1 TABLET BY MOUTH EVERY DAY   glucose blood (ACCU-CHEK AVIVA PLUS) test strip USE AS INSTRUCTED DX E11.9    hydrochlorothiazide (HYDRODIURIL) 25 MG tablet TAKE 1 TABLET (25 MG TOTAL) BY MOUTH DAILY.   LEVEMIR FLEXTOUCH 100 UNIT/ML FlexTouch Pen INJECT 15 UNITS UNDER THE SKIN EVERY DAY IN AFTERNOON   metoprolol tartrate (LOPRESSOR) 25 MG tablet TAKE 1/2 TABLET BY MOUTH EVERY DAY   ondansetron (ZOFRAN ODT) 4 MG disintegrating tablet Take 1 tablet (4 mg total) by mouth every 8 (eight) hours as needed.   SIMBRINZA 1-0.2 % SUSP Apply 1 drop to eye 2 (two) times daily.   timolol (TIMOPTIC) 0.5 % ophthalmic solution Apply to eye.   No facility-administered medications prior to visit.    Review of Systems  Constitutional:  Negative for appetite change, chills, fatigue and fever.  Respiratory:  Negative for chest tightness and shortness of breath.   Cardiovascular:  Negative for chest pain and palpitations.  Gastrointestinal:  Negative for abdominal pain, nausea and vomiting.  Neurological:  Positive for numbness. Negative for dizziness and weakness.    Last hemoglobin A1c Lab Results  Component Value Date   HGBA1C 6.2 (H) 12/27/2021       Objective    BP (!) 115/56 (BP Location: Right Arm, Patient Position: Sitting, Cuff Size: Normal)   Pulse 67   Resp 16   SpO2 100%  BP Readings from Last 3 Encounters:  03/17/22 (!) 115/56  12/27/21  120/61  06/28/21 (!) 136/58   Wt Readings from Last 3 Encounters:  12/27/21 117 lb 8 oz (53.3 kg)  04/04/21 126 lb (57.2 kg)  03/25/21 126 lb 9.6 oz (57.4 kg)      Physical Exam Constitutional:      General: She is not in acute distress.    Appearance: She is well-developed.  HENT:     Head: Normocephalic and atraumatic.     Right Ear: Hearing normal.     Left Ear: Hearing normal.     Nose: Nose normal.  Eyes:     General: Lids are normal. No scleral icterus.       Right eye: No discharge.        Left eye: No discharge.     Conjunctiva/sclera: Conjunctivae normal.  Cardiovascular:     Rate and Rhythm: Normal rate and regular rhythm.     Heart  sounds: Normal heart sounds.  Pulmonary:     Effort: Pulmonary effort is normal. No respiratory distress.  Skin:    Findings: No lesion or rash.  Neurological:     General: No focal deficit present.     Mental Status: She is alert and oriented to person, place, and time.     Comments: Nonfocal.  Psychiatric:        Mood and Affect: Mood normal.        Speech: Speech normal.        Behavior: Behavior normal.        Thought Content: Thought content normal.        Judgment: Judgment normal.       No results found for any visits on 03/17/22.  Assessment & Plan     1. Bilateral carpal tunnel syndrome Cock up splint bilaterally for the next month.  May need referral to Ortho.  2. Type 2 diabetes mellitus with other circulatory complication, unspecified whether long term insulin use (HCC) A1c when appropriate   No follow-ups on file.      I, Gabriela Durie, MD, have reviewed all documentation for this visit. The documentation on 03/22/22 for the exam, diagnosis, procedures, and orders are all accurate and complete.    Reshawn Ostlund Cranford Mon, MD  [facility] [phone] (phone) [phone] (fax)  [place]

## 2022-04-15 ENCOUNTER — Emergency Department: Payer: Medicare Other

## 2022-04-15 ENCOUNTER — Inpatient Hospital Stay
Admission: EM | Admit: 2022-04-15 | Discharge: 2022-04-21 | DRG: 604 | Disposition: A | Discharge status: Home Health | Payer: Medicare Other | Attending: Internal Medicine | Admitting: Internal Medicine

## 2022-04-15 ENCOUNTER — Other Ambulatory Visit: Payer: Self-pay

## 2022-04-15 DIAGNOSIS — N179 Acute kidney failure, unspecified: Secondary | ICD-10-CM | POA: Diagnosis not present

## 2022-04-15 DIAGNOSIS — T84050A Periprosthetic osteolysis of internal prosthetic right hip joint, initial encounter: Secondary | ICD-10-CM | POA: Diagnosis not present

## 2022-04-15 DIAGNOSIS — N1831 Chronic kidney disease, stage 3a: Secondary | ICD-10-CM | POA: Diagnosis present

## 2022-04-15 DIAGNOSIS — D638 Anemia in other chronic diseases classified elsewhere: Secondary | ICD-10-CM | POA: Diagnosis present

## 2022-04-15 DIAGNOSIS — R6889 Other general symptoms and signs: Secondary | ICD-10-CM | POA: Diagnosis not present

## 2022-04-15 DIAGNOSIS — I129 Hypertensive chronic kidney disease with stage 1 through stage 4 chronic kidney disease, or unspecified chronic kidney disease: Secondary | ICD-10-CM | POA: Diagnosis present

## 2022-04-15 DIAGNOSIS — D472 Monoclonal gammopathy: Secondary | ICD-10-CM | POA: Diagnosis not present

## 2022-04-15 DIAGNOSIS — I2510 Atherosclerotic heart disease of native coronary artery without angina pectoris: Secondary | ICD-10-CM | POA: Diagnosis present

## 2022-04-15 DIAGNOSIS — Z951 Presence of aortocoronary bypass graft: Secondary | ICD-10-CM

## 2022-04-15 DIAGNOSIS — M25551 Pain in right hip: Secondary | ICD-10-CM | POA: Diagnosis present

## 2022-04-15 DIAGNOSIS — S71101A Unspecified open wound, right thigh, initial encounter: Secondary | ICD-10-CM | POA: Diagnosis not present

## 2022-04-15 DIAGNOSIS — E1169 Type 2 diabetes mellitus with other specified complication: Secondary | ICD-10-CM | POA: Diagnosis present

## 2022-04-15 DIAGNOSIS — G9341 Metabolic encephalopathy: Secondary | ICD-10-CM | POA: Diagnosis present

## 2022-04-15 DIAGNOSIS — E872 Acidosis, unspecified: Secondary | ICD-10-CM | POA: Diagnosis not present

## 2022-04-15 DIAGNOSIS — E1122 Type 2 diabetes mellitus with diabetic chronic kidney disease: Secondary | ICD-10-CM | POA: Diagnosis not present

## 2022-04-15 DIAGNOSIS — M1611 Unilateral primary osteoarthritis, right hip: Secondary | ICD-10-CM | POA: Diagnosis not present

## 2022-04-15 DIAGNOSIS — D631 Anemia in chronic kidney disease: Secondary | ICD-10-CM | POA: Diagnosis not present

## 2022-04-15 DIAGNOSIS — B965 Pseudomonas (aeruginosa) (mallei) (pseudomallei) as the cause of diseases classified elsewhere: Secondary | ICD-10-CM | POA: Diagnosis present

## 2022-04-15 DIAGNOSIS — Z8249 Family history of ischemic heart disease and other diseases of the circulatory system: Secondary | ICD-10-CM

## 2022-04-15 DIAGNOSIS — S79911A Unspecified injury of right hip, initial encounter: Secondary | ICD-10-CM | POA: Diagnosis not present

## 2022-04-15 DIAGNOSIS — Z743 Need for continuous supervision: Secondary | ICD-10-CM | POA: Diagnosis not present

## 2022-04-15 DIAGNOSIS — E11649 Type 2 diabetes mellitus with hypoglycemia without coma: Secondary | ICD-10-CM | POA: Diagnosis present

## 2022-04-15 DIAGNOSIS — Z881 Allergy status to other antibiotic agents status: Secondary | ICD-10-CM

## 2022-04-15 DIAGNOSIS — N189 Chronic kidney disease, unspecified: Secondary | ICD-10-CM | POA: Diagnosis not present

## 2022-04-15 DIAGNOSIS — E785 Hyperlipidemia, unspecified: Secondary | ICD-10-CM | POA: Diagnosis present

## 2022-04-15 DIAGNOSIS — Z66 Do not resuscitate: Secondary | ICD-10-CM | POA: Diagnosis present

## 2022-04-15 DIAGNOSIS — L97112 Non-pressure chronic ulcer of right thigh with fat layer exposed: Secondary | ICD-10-CM | POA: Diagnosis not present

## 2022-04-15 DIAGNOSIS — G8929 Other chronic pain: Secondary | ICD-10-CM | POA: Diagnosis present

## 2022-04-15 DIAGNOSIS — Z833 Family history of diabetes mellitus: Secondary | ICD-10-CM | POA: Diagnosis not present

## 2022-04-15 DIAGNOSIS — Z79899 Other long term (current) drug therapy: Secondary | ICD-10-CM

## 2022-04-15 DIAGNOSIS — Z96641 Presence of right artificial hip joint: Secondary | ICD-10-CM | POA: Diagnosis present

## 2022-04-15 DIAGNOSIS — T148XXA Other injury of unspecified body region, initial encounter: Secondary | ICD-10-CM | POA: Diagnosis present

## 2022-04-15 DIAGNOSIS — R29898 Other symptoms and signs involving the musculoskeletal system: Secondary | ICD-10-CM | POA: Diagnosis not present

## 2022-04-15 DIAGNOSIS — R0902 Hypoxemia: Secondary | ICD-10-CM | POA: Diagnosis not present

## 2022-04-15 DIAGNOSIS — Z794 Long term (current) use of insulin: Secondary | ICD-10-CM | POA: Diagnosis not present

## 2022-04-15 DIAGNOSIS — H409 Unspecified glaucoma: Secondary | ICD-10-CM | POA: Diagnosis present

## 2022-04-15 DIAGNOSIS — X58XXXA Exposure to other specified factors, initial encounter: Secondary | ICD-10-CM | POA: Diagnosis present

## 2022-04-15 DIAGNOSIS — M25451 Effusion, right hip: Secondary | ICD-10-CM | POA: Diagnosis not present

## 2022-04-15 DIAGNOSIS — D696 Thrombocytopenia, unspecified: Secondary | ICD-10-CM | POA: Diagnosis not present

## 2022-04-15 DIAGNOSIS — E1129 Type 2 diabetes mellitus with other diabetic kidney complication: Secondary | ICD-10-CM

## 2022-04-15 LAB — CBC WITH DIFFERENTIAL/PLATELET
Abs Immature Granulocytes: 0.27 10*3/uL — ABNORMAL HIGH (ref 0.00–0.07)
Basophils Absolute: 0 10*3/uL (ref 0.0–0.1)
Basophils Relative: 0 %
Eosinophils Absolute: 0 10*3/uL (ref 0.0–0.5)
Eosinophils Relative: 0 %
HCT: 23.1 % — ABNORMAL LOW (ref 36.0–46.0)
Hemoglobin: 7.2 g/dL — ABNORMAL LOW (ref 12.0–15.0)
Immature Granulocytes: 4 %
Lymphocytes Relative: 26 %
Lymphs Abs: 1.9 10*3/uL (ref 0.7–4.0)
MCH: 24.2 pg — ABNORMAL LOW (ref 26.0–34.0)
MCHC: 31.2 g/dL (ref 30.0–36.0)
MCV: 77.8 fL — ABNORMAL LOW (ref 80.0–100.0)
Monocytes Absolute: 1.4 10*3/uL — ABNORMAL HIGH (ref 0.1–1.0)
Monocytes Relative: 20 %
Neutro Abs: 3.7 10*3/uL (ref 1.7–7.7)
Neutrophils Relative %: 50 %
Platelets: 86 10*3/uL — ABNORMAL LOW (ref 150–400)
RBC: 2.97 MIL/uL — ABNORMAL LOW (ref 3.87–5.11)
RDW: 17.2 % — ABNORMAL HIGH (ref 11.5–15.5)
Smear Review: DECREASED
WBC: 7.2 10*3/uL (ref 4.0–10.5)
nRBC: 0 % (ref 0.0–0.2)

## 2022-04-15 LAB — GLUCOSE, CAPILLARY
Glucose-Capillary: 108 mg/dL — ABNORMAL HIGH (ref 70–99)
Glucose-Capillary: 165 mg/dL — ABNORMAL HIGH (ref 70–99)

## 2022-04-15 LAB — BASIC METABOLIC PANEL
Anion gap: 10 (ref 5–15)
BUN: 56 mg/dL — ABNORMAL HIGH (ref 8–23)
CO2: 23 mmol/L (ref 22–32)
Calcium: 8.5 mg/dL — ABNORMAL LOW (ref 8.9–10.3)
Chloride: 107 mmol/L (ref 98–111)
Creatinine, Ser: 1.74 mg/dL — ABNORMAL HIGH (ref 0.44–1.00)
GFR, Estimated: 27 mL/min — ABNORMAL LOW (ref >60)
Glucose, Bld: 124 mg/dL — ABNORMAL HIGH (ref 70–99)
Potassium: 3.5 mmol/L (ref 3.5–5.1)
Sodium: 140 mmol/L (ref 135–145)

## 2022-04-15 LAB — PATHOLOGIST SMEAR REVIEW

## 2022-04-15 MED ORDER — OXYCODONE HCL 5 MG PO TABS
5.0000 mg | ORAL_TABLET | ORAL | Status: DC | PRN
  Administered 2022-04-15 – 2022-04-17 (×6): 5 mg via ORAL
  Filled 2022-04-15 (×6): qty 1

## 2022-04-15 MED ORDER — ACETAMINOPHEN 325 MG PO TABS
650.0000 mg | ORAL_TABLET | Freq: Four times a day (QID) | ORAL | Status: DC | PRN
  Administered 2022-04-15 – 2022-04-16 (×2): 650 mg via ORAL
  Filled 2022-04-15 (×3): qty 2

## 2022-04-15 MED ORDER — ALBUTEROL SULFATE (2.5 MG/3ML) 0.083% IN NEBU: 3.0000 mL | INHALATION_SOLUTION | RESPIRATORY_TRACT | Status: DC | PRN

## 2022-04-15 MED ORDER — SODIUM CHLORIDE 0.9 % IV SOLN
250.0000 mL | INTRAVENOUS | Status: DC | PRN
Start: 2022-04-15 — End: 2022-04-21

## 2022-04-15 MED ORDER — ONDANSETRON HCL 4 MG PO TABS: 4.0000 mg | ORAL_TABLET | Freq: Four times a day (QID) | ORAL | Status: DC | PRN

## 2022-04-15 MED ORDER — ACETAMINOPHEN 650 MG RE SUPP: 650.0000 mg | Freq: Four times a day (QID) | RECTAL | Status: DC | PRN

## 2022-04-15 MED ORDER — BRIMONIDINE TARTRATE 0.2 % OP SOLN
1.0000 [drp] | Freq: Three times a day (TID) | OPHTHALMIC | Status: DC
  Administered 2022-04-15 – 2022-04-21 (×16): 1 [drp] via OPHTHALMIC
  Filled 2022-04-15 (×2): qty 5

## 2022-04-15 MED ORDER — SODIUM CHLORIDE 0.9% FLUSH
3.0000 mL | INTRAVENOUS | Status: DC | PRN
Start: 2022-04-15 — End: 2022-04-21

## 2022-04-15 MED ORDER — TIMOLOL MALEATE 0.5 % OP SOLN
1.0000 [drp] | Freq: Every day | OPHTHALMIC | Status: DC
Start: 2022-04-15 — End: 2022-04-21
  Administered 2022-04-16 – 2022-04-21 (×6): 1 [drp] via OPHTHALMIC
  Filled 2022-04-15: qty 5

## 2022-04-15 MED ORDER — BRINZOLAMIDE 1 % OP SUSP
1.0000 [drp] | Freq: Three times a day (TID) | OPHTHALMIC | Status: DC
  Administered 2022-04-15 – 2022-04-21 (×17): 1 [drp] via OPHTHALMIC
  Filled 2022-04-15: qty 10

## 2022-04-15 MED ORDER — FERROUS SULFATE 325 (65 FE) MG PO TABS
325.0000 mg | ORAL_TABLET | Freq: Every day | ORAL | Status: DC
  Administered 2022-04-16 – 2022-04-21 (×6): 325 mg via ORAL
  Filled 2022-04-15 (×6): qty 1

## 2022-04-15 MED ORDER — SODIUM CHLORIDE 0.9% FLUSH
3.0000 mL | Freq: Two times a day (BID) | INTRAVENOUS | Status: DC
  Administered 2022-04-15 – 2022-04-20 (×10): 3 mL via INTRAVENOUS

## 2022-04-15 MED ORDER — TRAMADOL HCL 50 MG PO TABS
50.0000 mg | ORAL_TABLET | Freq: Once | ORAL | Status: AC
  Administered 2022-04-15: 50 mg via ORAL
  Filled 2022-04-15: qty 1

## 2022-04-15 MED ORDER — ONDANSETRON HCL 4 MG/2ML IJ SOLN
4.0000 mg | Freq: Four times a day (QID) | INTRAMUSCULAR | Status: DC | PRN
  Administered 2022-04-16: 4 mg via INTRAVENOUS
  Filled 2022-04-15: qty 2

## 2022-04-15 MED ORDER — METOPROLOL TARTRATE 25 MG PO TABS
12.5000 mg | ORAL_TABLET | Freq: Every day | ORAL | Status: DC
  Administered 2022-04-15 – 2022-04-17 (×3): 12.5 mg via ORAL
  Filled 2022-04-15 (×3): qty 1

## 2022-04-15 MED ORDER — INSULIN ASPART 100 UNIT/ML IJ SOLN
0.0000 [IU] | Freq: Three times a day (TID) | INTRAMUSCULAR | Status: DC
  Administered 2022-04-16: 3 [IU] via SUBCUTANEOUS
  Administered 2022-04-18 – 2022-04-19 (×2): 2 [IU] via SUBCUTANEOUS
  Administered 2022-04-20: 3 [IU] via SUBCUTANEOUS
  Filled 2022-04-15 (×4): qty 1

## 2022-04-15 MED ORDER — INSULIN DETEMIR 100 UNIT/ML ~~LOC~~ SOLN
15.0000 [IU] | Freq: Every day | SUBCUTANEOUS | Status: DC
Start: 2022-04-16 — End: 2022-04-16
  Filled 2022-04-15: qty 0.15

## 2022-04-15 NOTE — ED Provider Notes (Signed)
[facility] Provider Note    Event Date/Time   First MD Initiated Contact with Patient 04/15/22 [id number]     (approximate)   History   Hip Pain (Chronic right hip pain x 30 years , no pain when pt moves, pt seen at [facility])   HPI  Gabriela [name] is a 88 y.o. female with a history of chronic right hip pain who presents with acute on chronic pain since last night or early this morning.  The patient denies any falls or new injury to the hip.  She has a chronic wound from some old hardware but states that this is not bothering her.  She reports pain in the hip joint itself.  The pain is worse with any movement and relieved with rest.      Physical Exam   Triage Vital Signs: ED Triage Vitals [04/15/22 0847]  Enc Vitals Group     BP (!) 160/79     Pulse Rate 85     Resp 17     Temp 97.7 F (36.5 C)     Temp Source Oral     SpO2 98 %     Weight      Height      Head Circumference      Peak Flow      Pain Score      Pain Loc      Pain Edu?      Excl. in [facility]?     Most recent vital signs: Vitals:   04/15/22 1044 04/15/22 1103  BP: (!) 155/71   Pulse: 65   Resp: 18   Temp: 98 F (36.7 C) 97.9 F (36.6 C)  SpO2: 100%      General: Awake, no distress.  CV:  Good peripheral perfusion.  Resp:  Normal effort.  Abd:  No distention.  Other:  Right hip deformity.  Pain on any range of motion.  No focal bony tenderness.  1 cm wound to anterior right thigh with granulation tissue.  No surrounding erythema or induration.  No drainage.  2+ DP pulse.  Cap refill less than 2 seconds distally.   ED Results / Procedures / Treatments   Labs (all labs ordered are listed, but only abnormal results are displayed) Labs Reviewed  BASIC METABOLIC PANEL - Abnormal; Notable for the following components:      Result Value   Glucose, Bld 124 (*)    BUN 56 (*)    Creatinine, Ser 1.74 (*)    Calcium 8.5 (*)    GFR, Estimated 27 (*)    All other components  within normal limits  CBC WITH DIFFERENTIAL/PLATELET - Abnormal; Notable for the following components:   RBC 2.97 (*)    Hemoglobin 7.2 (*)    HCT 23.1 (*)    MCV 77.8 (*)    MCH 24.2 (*)    RDW 17.2 (*)    Platelets 86 (*)    Monocytes Absolute 1.4 (*)    Abs Immature Granulocytes 0.27 (*)    All other components within normal limits  PATHOLOGIST SMEAR REVIEW  URINALYSIS, ROUTINE W REFLEX MICROSCOPIC     EKG     RADIOLOGY  XR R hip: I independently viewed and interpreted the images; no acute fracture  PROCEDURES:  Critical Care performed: No  Procedures   MEDICATIONS ORDERED IN ED: Medications  traMADol (ULTRAM) tablet 50 mg (50 mg Oral Given 04/15/22 0942)     IMPRESSION / MDM /  ASSESSMENT AND PLAN / ED COURSE  I reviewed the triage vital signs and the nursing notes.  88 year old female with PMH as noted above presents with acute on chronic right hip pain with no new trauma or injury.  [facility] although I am unable to view the records.  She apparently has a remote history of hip arthroplasty with more recent hardware deterioration.  On exam the patient has pain on range of motion of the right hip and a chronic thigh small anterior I wound with granulation tissue which appears clean and not infected.  Differential diagnosis includes, but is not limited to, exacerbation of chronic pain, muscle strain, hardware malfunction, fracture.  Patient's presentation is most consistent with exacerbation of chronic illness.  We will obtain x-rays and reassess.  ----------------------------------------- 1:35 PM on 04/15/2022 -----------------------------------------  Family is here and provided additional history.  The patient normally walks with a walker and lives independently.  She acutely became unable to walk since last night due to the pain in the right hip.  She had no falls or trauma.  X-rays showed no acute findings although are limited by  the patient's osteopenia and hardware.  I obtained a CT which is also limited but shows no evidence of acute fracture.  There may be bursitis.  This could be explaining the patient's pain.  Basic labs are remarkable for anemia and thrombocytopenia which are chronic, and elevated creatinine also unchanged from baseline.  The patient's pain is improved with tramadol.  Given the acute development of the pain and inability to ambulate, I consulted Dr. Francine Graven from the hospitalist service; based on her discussion she agrees to admit the patient.  FINAL CLINICAL IMPRESSION(S) / ED DIAGNOSES   Final diagnoses:  Right hip pain     Rx / DC Orders   ED Discharge Orders     None        Note:  This document was prepared using Dragon voice recognition software and may include unintentional dictation errors.    Arta Silence, MD 04/15/22 1336

## 2022-04-15 NOTE — ED Triage Notes (Signed)
Chronic right hip pain x 30 years , no pain when pt moves, pt seen at [facility]

## 2022-04-15 NOTE — Assessment & Plan Note (Signed)
Probably related to hardware in patient's right lower extremity We will request wound care consult

## 2022-04-15 NOTE — Assessment & Plan Note (Signed)
--   Stable.  Continue metoprolol. 

## 2022-04-15 NOTE — H&P (Signed)
History and Physical    [name] XFG:[id number] DOB: 1926-01-22 DOA: 04/15/2022 DOS: the patient was seen and examined on 04/16/2022 PCP: Jerrol Banana., MD  Patient coming from: Home  Chief Complaint:  Chief Complaint  Patient presents with   Hip Pain    Chronic right hip pain x 30 years , no pain when pt moves, [facility]   HPI: Gabriela [name] is a 88 y.o. female with medical history significant for diabetes mellitus with complications of stage IV chronic kidney disease, glaucoma, hypertension and coronary artery disease who presents to the ER for evaluation of worsening chronic right hip pain. Patient had a right total hip arthroplasty over 30 years ago and has had issues with right hip pain which worsened overnight.  She rates her pain an 8 x 10 in intensity at its worst and is unable to ambulate or bear weight on her right leg.  Any form of movement aggravates her pain and it is relieved at rest.  She denies any fall or any trauma.  She has a chronic wound over her right thigh from old hardware which is nonhealing and for which she had followed up with orthopedic surgery in the past. She denies having any chest pain, no shortness of breath, no nausea, no vomiting, no changes in her bowel habits, no urinary symptoms, no blurred vision or focal deficit. 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 No definite acute fracture or dislocation. Definitive exclusion of a fracture is difficult due to the patient's extreme osteopenia and beam hardening artifact from the hardware. Radiographic follow up recommended if the patient remains symptomatic. Chronic fluid collection anterior to the right hip appears partially contained within the iliopsoas bursa and may relate to bursitis and/or particle disease. Follow this demonstrates possibly more inferior  extension into the anterior thigh common no acute fluid collections are suggested. Orthopedic surgery has been consulted  Review of Systems: As mentioned in the history of present illness. All other systems reviewed and are negative. Past Medical History:  Diagnosis Date   Arthritis    Diabetes mellitus ([place])    Glaucoma    Hyperlipidemia    Hypertension    Shortness of breath    with exertion   Past Surgical History:  Procedure Laterality Date   CARDIAC CATHETERIZATION  04/22/13   CHOLECYSTECTOMY     CORONARY ARTERY BYPASS GRAFT N/A 05/31/2013   Procedure: Coronary Artery Bypass Grafting Times Three Using Left Internal Mammary Artery and Right Saphenous Leg Vein Harvested Endoscopically;  Surgeon: Ivin Poot, MD;  Location: [place];  Service: Open Heart Surgery;  Laterality: N/A;   ERCP N/A 10/20/2020   Procedure: ENDOSCOPIC RETROGRADE CHOLANGIOPANCREATOGRAPHY (ERCP);  Surgeon: Lucilla Lame, MD;  Location: [facility] ENDOSCOPY;  Service: Endoscopy;  Laterality: N/A;   ERCP N/A 12/22/2020   Procedure: ENDOSCOPIC RETROGRADE CHOLANGIOPANCREATOGRAPHY (ERCP);  Surgeon: Lucilla Lame, MD;  Location: [facility] ENDOSCOPY;  Service: Endoscopy;  Laterality: N/A;   EYE SURGERY     HIP SURGERY     right   INTRAOPERATIVE TRANSESOPHAGEAL ECHOCARDIOGRAM N/A 05/31/2013   Procedure: INTRAOPERATIVE TRANSESOPHAGEAL ECHOCARDIOGRAM;  Surgeon: Ivin Poot, MD;  Location: [place];  Service: Open Heart Surgery;  Laterality: N/A;   JOINT REPLACEMENT     MITRAL VALVE REPAIR N/A 05/31/2013   Procedure: MITRAL VALVE REPAIR (MVR);  Surgeon: Ivin Poot, MD;  Location: [facility] OR;  Service: Open Heart Surgery;  Laterality: N/A;   TONSILLECTOMY     Social History:  reports that she has never smoked. She has never used smokeless tobacco. She reports that she does not drink alcohol and does not use drugs.  Allergies  Allergen Reactions   Sulfa Antibiotics Rash    Family History  Problem Relation Age of Onset    Diabetes Sister    Hypertension Mother     Prior to Admission medications   Medication Sig Start Date End Date Taking? Authorizing Provider  Accu-Chek FastClix Lancets MISC To check blood sugars once a day. DX E11.9 03/09/21   Jerrol Banana., MD  albuterol (VENTOLIN HFA) 108 (90 Base) MCG/ACT inhaler INHALE 1-2 PUFFS INTO THE LUNGS EVERY 4 HOURS AS NEEDED FOR WHEEZING OR SHORTNESS OF BREATH. 10/12/21   Jerrol Banana., MD  aspirin EC 81 MG tablet Take 81 mg by mouth daily.    [provider]  B-D UF III MINI PEN NEEDLES 31G X 5 MM MISC USE WITH INSULIN DOSES 09/04/21   Jerrol Banana., MD  benzonatate (TESSALON) 100 MG capsule Take 1 capsule (100 mg total) by mouth 3 (three) times daily as needed for cough. 06/28/21   Sharion Balloon, NP  Blood Glucose Monitoring Suppl (ACCU-CHEK AVIVA PLUS) w/Device KIT 1 each by Does not apply route daily. Dx E11.9 03/09/21   Jerrol Banana., MD  ferrous sulfate 325 (65 FE) MG tablet Take 1 tablet (325 mg total) by mouth daily with breakfast. TAKE 1 TABLET BY MOUTH DAILY. 01/04/22   Jerrol Banana., MD  furosemide (LASIX) 20 MG tablet TAKE 1 TABLET BY MOUTH EVERY DAY 02/12/22   Jerrol Banana., MD  glucose blood (ACCU-CHEK AVIVA PLUS) test strip USE AS INSTRUCTED DX E11.9 11/08/21   Jerrol Banana., MD  hydrochlorothiazide (HYDRODIURIL) 25 MG tablet TAKE 1 TABLET (25 MG TOTAL) BY MOUTH DAILY. 03/01/22   Jerrol Banana., MD  LEVEMIR FLEXTOUCH 100 UNIT/ML FlexTouch Pen INJECT 15 UNITS UNDER THE SKIN EVERY DAY IN AFTERNOON 12/11/21   Jerrol Banana., MD  metoprolol tartrate (LOPRESSOR) 25 MG tablet TAKE 1/2 TABLET BY MOUTH EVERY DAY 11/17/21   Jerrol Banana., MD  ondansetron (ZOFRAN ODT) 4 MG disintegrating tablet Take 1 tablet (4 mg total) by mouth every 8 (eight) hours as needed. 12/08/20   Menshew, Dannielle Karvonen, PA-C  SIMBRINZA 1-0.2 % SUSP Apply 1 drop to eye 2 (two) times daily. 12/07/21    [provider]  timolol (TIMOPTIC) 0.5 % ophthalmic solution Apply to eye. 11/16/21   [provider]    Physical Exam: Vitals:   04/15/22 1443 04/15/22 1534 04/16/22 0353 04/16/22 0848  BP: (!) 169/76 (!) 149/54 123/62 (!) 148/59  Pulse: 76 64 63 65  Resp:      Temp: 97.6 F (36.4 C) 97.8 F (36.6 C) 97.8 F (36.6 C) 98 F (36.7 C)  TempSrc:   Oral   SpO2: 100% 97% 100% 100%   Physical Exam Vitals and nursing note reviewed.  Constitutional:      Comments: Thin and frail  HENT:     Head: Normocephalic.     Nose: Nose normal.     Mouth/Throat:     Mouth: Mucous membranes are moist.  Eyes:     Comments: Pale conjunctiva  Cardiovascular:     Rate and Rhythm: Normal rate and  regular rhythm.  Pulmonary:     Effort: Pulmonary effort is normal.     Breath sounds: Normal breath sounds.  Abdominal:     General: Abdomen is flat. Bowel sounds are normal.     Palpations: Abdomen is soft.  Musculoskeletal:     Cervical back: Normal range of motion and neck supple.     Comments: Decreased range of motion right hip.  Tenderness and differential warmth over the right hip.  Nonhealing wound over the anterior portion of the right thigh  Skin:    General: Skin is warm and dry.  Neurological:     General: No focal deficit present.     Mental Status: She is alert.  Psychiatric:        Mood and Affect: Mood normal.        Behavior: Behavior normal.     Data Reviewed: Relevant notes from primary care and specialist visits, past discharge summaries as available in EHR, including Care Everywhere. Prior diagnostic testing as pertinent to current admission diagnoses Updated medications and problem lists for reconciliation ED course, including vitals, labs, imaging, treatment and response to treatment Triage notes, nursing and pharmacy notes and ED provider's notes Notable results as noted in HPI Labs reviewed.  White count 7.2, hemoglobin 7.2, hematocrit 23.1, MCV  77.8, RDW 17.2, platelet count 86, sodium 140, potassium 3.5, chloride 107, bicarb 23, glucose 124, BUN 56, creatinine 1.74, calcium 8.5 Right hip x-ray shows no definite evidence of displaced fracture. Severe diffuse demineralization limits evaluation for nondisplaced fracture, if there is continued clinical concern recommend further evaluation with cross-sectional imaging. 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 No definite acute fracture or dislocation. Definitive exclusion of a fracture is difficult due to the patient's extreme osteopenia and beam hardening artifact from the hardware. Radiographic follow up recommended if the patient remains symptomatic. Chronic fluid collection anterior to the right hip appears partially contained within the iliopsoas bursa and may relate to bursitis and/or particle disease. Follow this demonstrates possibly more inferior extension into the anterior thigh common no acute fluid collections are suggested.  There are no new results to review at this time.  Assessment and Plan: * Right hip pain Unclear etiology Possible bursitis Patient with severe pain and inability to bear weight on her right lower extremity Pain control Consult orthopedic surgery  Anemia of chronic disease Patient noted to have chronic anemia most likely related to underlying renal disease. Monitor closely during this hospitalization  Thrombocytopenia (HCC) Chronic.  Stable Monitor closely for bleeding  Non-healing non-surgical wound Probably related to hardware in patient's right lower extremity We will request wound care consult  CKD stage 4 due to type 2 diabetes mellitus ([place]) Patient has stage IV chronic kidney disease as a complication of her underlying diabetes mellitus Continue long-acting insulin as well as sliding scale coverage Maintain consistent carbohydrate diet  CAD  (coronary artery disease) Stable Continue metoprolol      Advance Care Planning:   Code Status: DNR   Consults: Orthopedic surgery  Family Communication: Greater than 50% of time was spent discussing patient's condition and plan of care with her and her granddaughter at the bedside.  All questions and concerns have been addressed.  They verbalized understanding and agree with the plan.  Severity of Illness: The appropriate patient status for this patient is OBSERVATION. Observation status is judged to be reasonable and necessary in order to  provide the required intensity of service to ensure the patient's safety. The patient's presenting symptoms, physical exam findings, and initial radiographic and laboratory data in the context of their medical condition is felt to place them at decreased risk for further clinical deterioration. Furthermore, it is anticipated that the patient will be medically stable for discharge from the hospital within 2 midnights of admission.   Author: Collier Bullock, MD 04/16/2022 9:25 AM  For on call review [email or web address].

## 2022-04-15 NOTE — ED Triage Notes (Signed)
No falls, per [facility] pt has screws from hip surgery coming out of right hip, [facility] aware,

## 2022-04-15 NOTE — Assessment & Plan Note (Addendum)
Patient does have a fluid collection anterior to the right hip partially contained within the iliopsoas bursa.  Case discussed with interventional radiology and they do not recommend draining the fluid because they think it will come right back because of the right hip prosthesis.  Orthopedic surgery does not want to do a hip replacement secondary to age and medical issues.  Pseudomonas growing out of the wound.  Changed antibiotics to cefepime.  We will consult infectious disease specialist to help out with treatment plan.

## 2022-04-15 NOTE — Assessment & Plan Note (Deleted)
Patient has stage IV chronic kidney disease as a complication of her underlying diabetes mellitus Continue long-acting insulin as well as sliding scale coverage Maintain consistent carbohydrate diet

## 2022-04-16 DIAGNOSIS — D696 Thrombocytopenia, unspecified: Secondary | ICD-10-CM | POA: Diagnosis present

## 2022-04-16 DIAGNOSIS — Z66 Do not resuscitate: Secondary | ICD-10-CM | POA: Diagnosis present

## 2022-04-16 DIAGNOSIS — E1169 Type 2 diabetes mellitus with other specified complication: Secondary | ICD-10-CM | POA: Diagnosis present

## 2022-04-16 DIAGNOSIS — N179 Acute kidney failure, unspecified: Secondary | ICD-10-CM

## 2022-04-16 DIAGNOSIS — D472 Monoclonal gammopathy: Secondary | ICD-10-CM

## 2022-04-16 DIAGNOSIS — I2510 Atherosclerotic heart disease of native coronary artery without angina pectoris: Secondary | ICD-10-CM | POA: Diagnosis present

## 2022-04-16 DIAGNOSIS — M25551 Pain in right hip: Secondary | ICD-10-CM | POA: Diagnosis present

## 2022-04-16 DIAGNOSIS — I129 Hypertensive chronic kidney disease with stage 1 through stage 4 chronic kidney disease, or unspecified chronic kidney disease: Secondary | ICD-10-CM | POA: Diagnosis present

## 2022-04-16 DIAGNOSIS — N189 Chronic kidney disease, unspecified: Secondary | ICD-10-CM

## 2022-04-16 DIAGNOSIS — G8929 Other chronic pain: Secondary | ICD-10-CM | POA: Diagnosis present

## 2022-04-16 DIAGNOSIS — G9341 Metabolic encephalopathy: Secondary | ICD-10-CM | POA: Diagnosis present

## 2022-04-16 DIAGNOSIS — D631 Anemia in chronic kidney disease: Secondary | ICD-10-CM | POA: Diagnosis present

## 2022-04-16 DIAGNOSIS — E1122 Type 2 diabetes mellitus with diabetic chronic kidney disease: Secondary | ICD-10-CM | POA: Diagnosis present

## 2022-04-16 DIAGNOSIS — N1831 Chronic kidney disease, stage 3a: Secondary | ICD-10-CM

## 2022-04-16 DIAGNOSIS — E11649 Type 2 diabetes mellitus with hypoglycemia without coma: Secondary | ICD-10-CM | POA: Diagnosis present

## 2022-04-16 DIAGNOSIS — X58XXXA Exposure to other specified factors, initial encounter: Secondary | ICD-10-CM | POA: Diagnosis present

## 2022-04-16 DIAGNOSIS — T148XXA Other injury of unspecified body region, initial encounter: Secondary | ICD-10-CM | POA: Diagnosis not present

## 2022-04-16 DIAGNOSIS — Z96641 Presence of right artificial hip joint: Secondary | ICD-10-CM | POA: Diagnosis present

## 2022-04-16 DIAGNOSIS — B965 Pseudomonas (aeruginosa) (mallei) (pseudomallei) as the cause of diseases classified elsewhere: Secondary | ICD-10-CM | POA: Diagnosis present

## 2022-04-16 DIAGNOSIS — E872 Acidosis, unspecified: Secondary | ICD-10-CM | POA: Diagnosis present

## 2022-04-16 DIAGNOSIS — Z833 Family history of diabetes mellitus: Secondary | ICD-10-CM | POA: Diagnosis not present

## 2022-04-16 DIAGNOSIS — Z794 Long term (current) use of insulin: Secondary | ICD-10-CM | POA: Diagnosis not present

## 2022-04-16 DIAGNOSIS — Z79899 Other long term (current) drug therapy: Secondary | ICD-10-CM | POA: Diagnosis not present

## 2022-04-16 DIAGNOSIS — D638 Anemia in other chronic diseases classified elsewhere: Secondary | ICD-10-CM | POA: Diagnosis not present

## 2022-04-16 DIAGNOSIS — S71101A Unspecified open wound, right thigh, initial encounter: Secondary | ICD-10-CM | POA: Diagnosis present

## 2022-04-16 DIAGNOSIS — Z8249 Family history of ischemic heart disease and other diseases of the circulatory system: Secondary | ICD-10-CM | POA: Diagnosis not present

## 2022-04-16 DIAGNOSIS — E785 Hyperlipidemia, unspecified: Secondary | ICD-10-CM | POA: Diagnosis present

## 2022-04-16 LAB — IRON AND TIBC
Iron: 22 ug/dL — ABNORMAL LOW (ref 28–170)
Saturation Ratios: 11 % (ref 10.4–31.8)
TIBC: 207 ug/dL — ABNORMAL LOW (ref 250–450)
UIBC: 185 ug/dL

## 2022-04-16 LAB — GLUCOSE, CAPILLARY
Glucose-Capillary: 101 mg/dL — ABNORMAL HIGH (ref 70–99)
Glucose-Capillary: 134 mg/dL — ABNORMAL HIGH (ref 70–99)
Glucose-Capillary: 137 mg/dL — ABNORMAL HIGH (ref 70–99)
Glucose-Capillary: 68 mg/dL — ABNORMAL LOW (ref 70–99)
Glucose-Capillary: 76 mg/dL (ref 70–99)
Glucose-Capillary: 125 mg/dL — ABNORMAL HIGH (ref 70–99)

## 2022-04-16 LAB — BASIC METABOLIC PANEL
Anion gap: 6 (ref 5–15)
BUN: 41 mg/dL — ABNORMAL HIGH (ref 8–23)
CO2: 23 mmol/L (ref 22–32)
Calcium: 8.5 mg/dL — ABNORMAL LOW (ref 8.9–10.3)
Chloride: 110 mmol/L (ref 98–111)
Creatinine, Ser: 1.27 mg/dL — ABNORMAL HIGH (ref 0.44–1.00)
GFR, Estimated: 39 mL/min — ABNORMAL LOW (ref >60)
Glucose, Bld: 107 mg/dL — ABNORMAL HIGH (ref 70–99)
Potassium: 3.7 mmol/L (ref 3.5–5.1)
Sodium: 139 mmol/L (ref 135–145)

## 2022-04-16 LAB — C DIFFICILE QUICK SCREEN W PCR REFLEX
C Diff antigen: NEGATIVE
C Diff interpretation: NOT DETECTED
C Diff toxin: NEGATIVE

## 2022-04-16 LAB — CBC
HCT: 21.6 % — ABNORMAL LOW (ref 36.0–46.0)
Hemoglobin: 6.8 g/dL — ABNORMAL LOW (ref 12.0–15.0)
MCH: 24.3 pg — ABNORMAL LOW (ref 26.0–34.0)
MCHC: 31.5 g/dL (ref 30.0–36.0)
MCV: 77.1 fL — ABNORMAL LOW (ref 80.0–100.0)
Platelets: 73 10*3/uL — ABNORMAL LOW (ref 150–400)
RBC: 2.8 MIL/uL — ABNORMAL LOW (ref 3.87–5.11)
RDW: 16.8 % — ABNORMAL HIGH (ref 11.5–15.5)
WBC: 7.1 10*3/uL (ref 4.0–10.5)
nRBC: 0 % (ref 0.0–0.2)

## 2022-04-16 LAB — URINALYSIS, ROUTINE W REFLEX MICROSCOPIC
Bilirubin Urine: NEGATIVE
Glucose, UA: NEGATIVE mg/dL
Ketones, ur: NEGATIVE mg/dL
Leukocytes,Ua: NEGATIVE
Nitrite: NEGATIVE
Protein, ur: NEGATIVE mg/dL
Specific Gravity, Urine: 1.015 (ref 1.005–1.030)
pH: 5.5 (ref 5.0–8.0)

## 2022-04-16 LAB — HEMOGLOBIN AND HEMATOCRIT, BLOOD
HCT: 26.4 % — ABNORMAL LOW (ref 36.0–46.0)
Hemoglobin: 8.4 g/dL — ABNORMAL LOW (ref 12.0–15.0)

## 2022-04-16 LAB — FERRITIN: Ferritin: 326 ng/mL — ABNORMAL HIGH (ref 11–307)

## 2022-04-16 LAB — SEDIMENTATION RATE: Sed Rate: 109 mm/hr — ABNORMAL HIGH (ref 0–30)

## 2022-04-16 LAB — PREPARE RBC (CROSSMATCH)

## 2022-04-16 MED ORDER — SODIUM CHLORIDE 0.9% IV SOLUTION: Freq: Once | INTRAVENOUS | Status: AC

## 2022-04-16 MED ORDER — ACETAMINOPHEN 325 MG PO TABS
650.0000 mg | ORAL_TABLET | Freq: Once | ORAL | Status: AC
  Administered 2022-04-16: 650 mg via ORAL
  Filled 2022-04-16: qty 2

## 2022-04-16 MED ORDER — SODIUM CHLORIDE 0.9 % IV SOLN: INTRAVENOUS | Status: DC

## 2022-04-16 MED ORDER — FAMOTIDINE 20 MG PO TABS
20.0000 mg | ORAL_TABLET | Freq: Every day | ORAL | Status: DC
  Administered 2022-04-16 – 2022-04-21 (×6): 20 mg via ORAL
  Filled 2022-04-16 (×6): qty 1

## 2022-04-16 MED ORDER — VANCOMYCIN HCL IN DEXTROSE 1-5 GM/200ML-% IV SOLN
1000.0000 mg | Freq: Once | INTRAVENOUS | Status: DC
Start: 2022-04-16 — End: 2022-04-16
  Filled 2022-04-16: qty 200

## 2022-04-16 MED ORDER — VANCOMYCIN HCL IN DEXTROSE 1-5 GM/200ML-% IV SOLN
1000.0000 mg | Freq: Once | INTRAVENOUS | Status: AC
Start: 2022-04-16 — End: 2022-04-16
  Administered 2022-04-16: 1000 mg via INTRAVENOUS
  Filled 2022-04-16: qty 200

## 2022-04-16 MED ORDER — AMPICILLIN-SULBACTAM SODIUM 3 (2-1) G IJ SOLR
3.0000 g | Freq: Two times a day (BID) | INTRAMUSCULAR | Status: DC
  Administered 2022-04-16 – 2022-04-18 (×5): 3 g via INTRAVENOUS
  Filled 2022-04-16 (×3): qty 3
  Filled 2022-04-16 (×3): qty 8

## 2022-04-16 MED ORDER — VANCOMYCIN VARIABLE DOSE PER UNSTABLE RENAL FUNCTION (PHARMACIST DOSING): Status: DC

## 2022-04-16 MED ORDER — FUROSEMIDE 10 MG/ML IJ SOLN
20.0000 mg | Freq: Once | INTRAMUSCULAR | Status: AC
  Administered 2022-04-16: 20 mg via INTRAVENOUS
  Filled 2022-04-16: qty 4

## 2022-04-16 NOTE — Assessment & Plan Note (Addendum)
The patient was given 1 unit of packed red blood cells on 04/16/2022 with good response.  Today's hemoglobin did drift down to 7.3.  I will give another unit of packed red blood cells today.

## 2022-04-16 NOTE — Assessment & Plan Note (Addendum)
Type 2 diabetes mellitus with chronic kidney disease stage IIIa.  Patient on Levemir insulin and sliding scale.

## 2022-04-16 NOTE — Consult Note (Signed)
Pharmacy Antibiotic Note  Gabriela [name] is a 88 y.o. female admitted on 04/15/2022 with  Chronic, nonhealing wound with exposed hardware to right anterior hip .  Pharmacy has been consulted for Vancomycin & unasyn dosing.  Plan: Scr currently 1.74>1.27 (last measured in 12/27/2021 1.6) Initiate Unasyn 3g IV q12h Loading dose of vancomycin 1g IV x1 for now. Pt creatine better than last know baseline, but even at current renal function would be either 750 to 1g q48h dosing. Will hold off scheduling maintenance regimen and assess with AM creatinine for scheduled vs dose by levels strategy.   Temp (24hrs), Avg:97.9 F (36.6 C), Min:97.6 F (36.4 C), Max:98 F (36.7 C)  Recent Labs  Lab 04/15/22 0950 04/16/22 0529  WBC 7.2 7.1  CREATININE 1.74* 1.27*    CrCl cannot be calculated (Unknown ideal weight.).    Allergies  Allergen Reactions   Sulfa Antibiotics Rash    Antimicrobials this admission: Unasyn/Vanco (9/2 >>   Dose adjustments this admission: CTM and adjust accordingly. Pt better than last know baseline but significant CKD.  Microbiology results: 9/2 Wound (superficial specimen): sent/pending  9/2 MRSA PCR: sent/pending  Thank you for allowing pharmacy to be a part of this patient's care.  Shanon Brow Clorene Nerio 04/16/2022 9:14 AM

## 2022-04-16 NOTE — Consult Note (Signed)
WOC Nurse Consult Note: Reason for Consult:Chronic, nonhealing wound with exposed hardware to right anterior hip. Followed by Orthopedics. Orthopedics has been simultaneously consulted but has not yet seen. [place] nursing to provide conservative guidance for care until ortho sees. Wound type:Surgical Pressure Injury POA: N/A Measurement:Bedside RN to measure and document measurements on Nursing Flow Sheet with placement of first dressing today. Wound NLG:XQJJ in center, yellow periphery per Nursing Flow Sheet Drainage (amount, consistency, odor) small serous Periwound:Intact, dry Dressing procedure/placement/frequency:I have provided Nursing with conservative guidance for this chronic wound using a daily NS cleanse followed by placement of a gauze dressing moistened with povidone-iodine (betadine solution). Thi is to be topped with dry gauze ad secured with a silicone foam dressing for atraumatic changes. Any orders placed by orthopedics will supercede mine.  Pressure injury prevention measures are implemented including turning and repositioning, placement of a sacral prophylactic foam dressing and the floatation of the patient's heels.  [place] nursing team will not follow, but will remain available to this patient, the nursing and medical teams.  Please re-consult if needed.  Thank you for inviting [place] to participate in this patient's Plan of Care.  Maudie Flakes, MSN, RN, CNS, [place], Serita Grammes, [employer], [employer] phone:  [phone]

## 2022-04-16 NOTE — Assessment & Plan Note (Signed)
Acute kidney injury on CKD stage IIIa.  Creatinine 1.74 on admission Down to 1.27 on 04/16/2022.  Creatinine up to 1.43 today.  Continue gentle IV fluids.

## 2022-04-16 NOTE — Consult Note (Signed)
ORTHOPAEDIC CONSULTATION  REQUESTING PHYSICIAN: Loletha Grayer, MD  Chief Complaint: right hip pain  HPI: Gabriela [name] is a 88 y.o. female who complains of right hip pain.  Patient states that the pain has worsened over the last few days without any inciting fall or injury.  She underwent hip replacement in 1994 and recent imaging over the last few years has showed evidence of significant osteolysis.  She has been seen at the [facility] practice by Dr. Sabra Heck and Dr. Harlow Mares in 2018 and 2019 where they discussed that any revision procedure would be a large endeavor given her age and functional status.  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  She states that over the years, this is gotten better and recurred.  She states that recently there has been a small amount of drainage but nothing atypical.  CT was obtained in the ER which showed evidence of osteolysis, osteopenia, no obvious evidence of fracture, and chronic fluid collection anterior to the hip partially in the iliopsoas likely related to particle disease. Orthopedics was consulted regarding further management.  Past medical history notable for diabetes mellitus with complications of stage IV chronic kidney disease, glaucoma, hypertension and coronary artery disease.   Past Medical History:  Diagnosis Date   Arthritis    Diabetes mellitus ([place])    Glaucoma    Hyperlipidemia    Hypertension    Shortness of breath    with exertion   Past Surgical History:  Procedure Laterality Date   CARDIAC CATHETERIZATION  04/22/13   CHOLECYSTECTOMY     CORONARY ARTERY BYPASS GRAFT N/A 05/31/2013   Procedure: Coronary Artery Bypass Grafting Times Three Using Left Internal Mammary Artery and Right Saphenous Leg Vein Harvested Endoscopically;  Surgeon: Ivin Poot, MD;  Location: [place];  Service: Open Heart Surgery;  Laterality: N/A;   ERCP  N/A 10/20/2020   Procedure: ENDOSCOPIC RETROGRADE CHOLANGIOPANCREATOGRAPHY (ERCP);  Surgeon: Lucilla Lame, MD;  Location: [facility] ENDOSCOPY;  Service: Endoscopy;  Laterality: N/A;   ERCP N/A 12/22/2020   Procedure: ENDOSCOPIC RETROGRADE CHOLANGIOPANCREATOGRAPHY (ERCP);  Surgeon: Lucilla Lame, MD;  Location: [facility] ENDOSCOPY;  Service: Endoscopy;  Laterality: N/A;   EYE SURGERY     HIP SURGERY     right   INTRAOPERATIVE TRANSESOPHAGEAL ECHOCARDIOGRAM N/A 05/31/2013   Procedure: INTRAOPERATIVE TRANSESOPHAGEAL ECHOCARDIOGRAM;  Surgeon: Ivin Poot, MD;  Location: [place];  Service: Open Heart Surgery;  Laterality: N/A;   JOINT REPLACEMENT     MITRAL VALVE REPAIR N/A 05/31/2013   Procedure: MITRAL VALVE REPAIR (MVR);  Surgeon: Ivin Poot, MD;  Location: [place];  Service: Open Heart Surgery;  Laterality: N/A;   TONSILLECTOMY     Social History   Socioeconomic History   Marital status: Widowed    Spouse name: Not on file   Number of children: 7   Years of education: Not on file   Highest education level: 11th grade  Occupational History   Occupation: retired  Tobacco Use   Smoking status: Never   Smokeless tobacco: Never  Vaping Use   Vaping Use: Never used  Substance and Sexual Activity   Alcohol use: No   Drug use: No   Sexual activity: Never  Other Topics Concern   Not on file  Social History Narrative   Not on file   Social Determinants of Health   Financial Resource Strain: Low Risk  (09/07/2021)  Overall Financial Resource Strain (CARDIA)    Difficulty of Paying Living Expenses: Not hard at all  Food Insecurity: No Food Insecurity (09/07/2021)   Hunger Vital Sign    Worried About Running Out of Food in the Last Year: Never true    Ran Out of Food in the Last Year: Never true  Transportation Needs: No Transportation Needs (09/07/2021)   PRAPARE - Hydrologist (Medical): No    Lack of Transportation (Non-Medical): No  Physical Activity:  Inactive (09/07/2021)   Exercise Vital Sign    Days of Exercise per Week: 0 days    Minutes of Exercise per Session: 0 min  Stress: No Stress Concern Present (09/07/2021)   [place]    Feeling of Stress : Not at all  Social Connections: Moderately Isolated (09/07/2021)   Social Connection and Isolation Panel [NHANES]    Frequency of Communication with Friends and Family: More than three times a week    Frequency of Social Gatherings with Friends and Family: More than three times a week    Attends Religious Services: More than 4 times per year    Active Member of [employer] or Organizations: No    Attends Archivist Meetings: Not on file    Marital Status: Widowed   Family History  Problem Relation Age of Onset   Diabetes Sister    Hypertension Mother    Allergies  Allergen Reactions   Sulfa Antibiotics Rash   Prior to Admission medications   Medication Sig Start Date End Date Taking? Authorizing Provider  aspirin EC 81 MG tablet Take 81 mg by mouth daily.   Yes [provider]  ferrous sulfate 325 (65 FE) MG tablet Take 1 tablet (325 mg total) by mouth daily with breakfast. TAKE 1 TABLET BY MOUTH DAILY. 01/04/22  Yes Jerrol Banana., MD  furosemide (LASIX) 20 MG tablet TAKE 1 TABLET BY MOUTH EVERY DAY 02/12/22  Yes Jerrol Banana., MD  hydrochlorothiazide (HYDRODIURIL) 25 MG tablet TAKE 1 TABLET (25 MG TOTAL) BY MOUTH DAILY. 03/01/22  Yes Jerrol Banana., MD  LEVEMIR FLEXTOUCH 100 UNIT/ML FlexTouch Pen INJECT 15 UNITS UNDER THE SKIN EVERY DAY IN AFTERNOON Patient taking differently: 10 Units at bedtime. 12/11/21  Yes Jerrol Banana., MD  metoprolol tartrate (LOPRESSOR) 25 MG tablet TAKE 1/2 TABLET BY MOUTH EVERY DAY 11/17/21  Yes Jerrol Banana., MD  [facility] 1-0.2 % SUSP Apply 1 drop to eye 2 (two) times daily. 12/07/21  Yes [provider]  timolol (TIMOPTIC) 0.5 %  ophthalmic solution Apply to eye. 11/16/21  Yes [provider]  Accu-Chek FastClix Lancets MISC To check blood sugars once a day. DX E11.9 03/09/21   Jerrol Banana., MD  albuterol (VENTOLIN HFA) 108 (90 Base) MCG/ACT inhaler INHALE 1-2 PUFFS INTO THE LUNGS EVERY 4 HOURS AS NEEDED FOR WHEEZING OR SHORTNESS OF BREATH. 10/12/21   Jerrol Banana., MD  B-D UF III MINI PEN NEEDLES 31G X 5 MM MISC USE WITH INSULIN DOSES 09/04/21   Jerrol Banana., MD  benzonatate (TESSALON) 100 MG capsule Take 1 capsule (100 mg total) by mouth 3 (three) times daily as needed for cough. Patient not taking: Reported on 04/15/2022 06/28/21   Sharion Balloon, NP  Blood Glucose Monitoring Suppl (ACCU-CHEK AVIVA PLUS) w/Device KIT 1 each by Does not apply route daily. Dx E11.9 03/09/21  Jerrol Banana., MD  glucose blood (ACCU-CHEK AVIVA PLUS) test strip USE AS INSTRUCTED DX E11.9 11/08/21   Jerrol Banana., MD  ondansetron (ZOFRAN ODT) 4 MG disintegrating tablet Take 1 tablet (4 mg total) by mouth every 8 (eight) hours as needed. Patient not taking: Reported on 04/15/2022 12/08/20   Menshew, Dannielle Karvonen, PA-C   CT Hip Right Wo Contrast  Result Date: 04/15/2022 CLINICAL DATA:  Hip trauma, fracture suspected. EXAM: CT OF THE RIGHT HIP WITHOUT CONTRAST TECHNIQUE: Multidetector CT imaging of the right hip was performed according to the standard protocol. Multiplanar CT image reconstructions were also generated. RADIATION DOSE REDUCTION: This exam was performed according to the departmental dose-optimization program which includes automated exposure control, adjustment of the mA and/or kV according to patient size and/or use of iterative reconstruction technique. COMPARISON:  Radiographs same date and 05/19/2018. Pelvic CT 10/18/2020. Right femur MRI 10/19/2017. FINDINGS: Bones/Joint/Cartilage Status post right total hip arthroplasty with a screw fixed acetabular component. Osseous evaluation is limited  by beam hardening artifact and the severe osteopenia. There is chronic osteolysis surrounding the acetabular cup with a large chronic defect in the medial wall of the acetabulum. Chronic superior migration of the acetabular cup appears similar to previous CT, and no definite acute acetabular fracture is demonstrated. No definite femur fracture or loosening of the femoral stem identified allowing for the limitations above. There is no dislocation or evidence of polyethylene wear. Ligaments Suboptimally assessed by CT. Muscles and Tendons There is a chronic fluid collection anterior to the right hip which is likely partially contained in the iliopsoas bursa and measures up to 5.0 x 3.0 cm on image 57/5. This is partially obscured by beam hardening artifact, although appears similar to the recent pelvic CT. This tracks slightly more inferiorly into the anterior aspect of the right thigh. Diffuse muscular atrophy, especially of the right iliacus muscle. No definite acute intramuscular fluid collection. Soft tissues Chronic fluid collection anterior to the right hip as noted above. No definite acute fluid collection, foreign body or soft tissue emphysema. Diffuse iliofemoral atherosclerosis. IMPRESSION: 1. 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 2. No definite acute fracture or dislocation. Definitive exclusion of a fracture is difficult due to the patient's extreme osteopenia and beam hardening artifact from the hardware. Radiographic follow up recommended if the patient remains symptomatic. 3. Chronic fluid collection anterior to the right hip appears partially contained within the iliopsoas bursa and may relate to bursitis and/or particle disease. Follow this demonstrates possibly more inferior extension into the anterior thigh common no acute fluid collections are suggested. Electronically Signed   By: Richardean Sale M.D.   On: 04/15/2022 10:57    DG Hip Unilat W or Wo Pelvis 2-3 Views Right  Result Date: 04/15/2022 CLINICAL DATA:  Hip pain EXAM: DG HIP (WITH OR WITHOUT PELVIS) 2V RIGHT COMPARISON:  Hip radiograph dated May 19, 2018 FINDINGS: Prior right total hip arthroplasty. Unchanged lucency adjacent to the acetabular cup and at the lesser trochanter. No definite evidence of displaced fracture. No evidence of dislocation. Advanced degenerative changes of the left hip. Vascular calcifications. Severe diffuse demineralization. IMPRESSION: No definite evidence of displaced fracture. Severe diffuse demineralization limits evaluation for nondisplaced fracture, if there is continued clinical concern recommend further evaluation with cross-sectional imaging. Electronically Signed   By: Yetta Glassman M.D.   On: 04/15/2022 09:28    Positive ROS: All other systems have been reviewed and  were otherwise negative with the exception of those mentioned in the HPI and as above.  Physical Exam: General: Alert, no acute distress Cardiovascular: No pedal edema Respiratory: No cyanosis, no use of accessory musculature GI: No organomegaly, abdomen is soft and non-tender Skin: No lesions in the area of chief complaint Neurologic: Sensation intact distally Psychiatric: Patient is competent for consent with normal mood and affect Lymphatic: No axillary or cervical lymphadenopathy  MUSCULOSKELETAL:   Right hip: There is mild tenderness to palpation about the lateral and anterior aspect of the hip.  Hip is held in approximately 30 degrees of flexion.  There is discomfort with attempts at active or passive range of motion at the hip.  There is a 2 cm wound, possible sinus tract in the anterior aspect of the thigh. Right lower extremity is neurovascular intact   Assessment: 88 year old female status post right hip arthroplasty in 1994 with chronic osteolysis and draining sinus tract, admitted with new onset right hip pain over the last few days.   Patient has a normal white count, ESR of 100 and is currently on empiric antibiotics.  Plan: The patient has previously been followed at [facility] with Dr. Sabra Heck and Dr. Harlow Mares, last in 2019.  Recommendation at that time was for wound care for the sinus tract and only consideration for excision of sinus tract and revision arthroplasty, which would likely involve placement of an antibiotic spacer, if the patient has overt signs of joint infection and is able to tolerate a large revision procedure, which may be unlikely given her medical comorbidities.  It is unclear whether the patient's symptoms at this time are infectious in origin or related to progressive osteolysis.  I would recommend continuing with antibiotics, wound care, pain control and physical therapy and gauge her clinical improvement.  If there is persistent concern for infection, IR guided procedure to aspirate the fluid collection could be an option.  I feel that any attempt at surgical management of this problem would be very high risk.    Renee Harder, MD    04/16/2022 1:17 PM

## 2022-04-16 NOTE — Progress Notes (Addendum)
PT Cancellation Note  Patient Details Name: Gabriela [name] MRN: [id number] DOB: 1926-02-22   Cancelled Treatment:    Reason Eval/Treat Not Completed: Patient not medically ready (Hbg 6.8 and currently receiving blood transfusion, ortho consult has not been completed on R hip. Hold PT until ortho consult and patient has received blood. Will re-attempt later time/date as medically appropriate.)  Everlean Alstrom. Graylon Good, PT, DPT 04/16/22, 10:41 AM

## 2022-04-16 NOTE — Progress Notes (Signed)
Progress Note   [name] MBW:[id number] DOB: [birth year] DOA: 04/15/2022     0 DOS: the patient was seen and examined on 04/16/2022     Assessment and Plan: * Right hip pain Await orthopedic evaluation.  Patient does have a complex collection anterior to the right hip partially contained within the iliopsoas bursa.  We will get physical therapy evaluation.  I am starting empiric antibiotics, case discussed with orthopedic surgery.  Non-healing non-surgical wound Chronic wound.  Sedimentation rate being above 100 and will start empiric antibiotics and get wound culture.  We will see what orthopedic surgery has to say.  May end up needing an MRI of the right thigh.  Anemia of chronic disease Hemoglobin dipped down to 6.8.  Benefits and risks of blood transfusion explained to patient and family.  1 unit of packed red blood cells prescribed.  Acute kidney injury superimposed on chronic kidney disease ([place]) Acute kidney injury on CKD stage IIIa.  Creatinine 1.74 Down to 1.27.  Type 2 diabetes mellitus with renal manifestations (HCC) Type 2 diabetes mellitus with chronic kidney disease stage IIIa.  Patient on Levemir insulin and sliding scale.  Thrombocytopenia (HCC) Chronic thrombocytopenia at last platelet count 73  CAD (coronary artery disease) Stable Continue metoprolol  MGUS (monoclonal gammopathy of unknown significance) I will send off another serum protein electrophoresis with sedimentation rate being very high.        Subjective: Patient normally walks with a walker.  Having pain in the right hip area.  Unable to walk.  Painful with me moving around her hip.  Has chronic wound of the right anterior thigh that sometimes drains.  Physical Exam: Vitals:   04/16/22 1002 04/16/22 1010 04/16/22 1013 04/16/22 1230  BP:  (!) 116/45 (!) 116/45 (!) 140/37  Pulse:  (!) 59 (!) 59 (!) 51  Resp:  '17 17 17  '$ Temp:  98.1 F (36.7 C) 98.1 F (36.7 C) (!) 97.1 F (36.2  C)  TempSrc:  Oral  Oral  SpO2:  100% 100% 100%  Weight: 53.3 kg     Height: '5\' 2"'$  (1.575 m)      Physical Exam HENT:     Head: Normocephalic.     Mouth/Throat:     Pharynx: No oropharyngeal exudate.  Eyes:     General: Lids are normal.     Conjunctiva/sclera: Conjunctivae normal.  Cardiovascular:     Rate and Rhythm: Normal rate and regular rhythm.     Heart sounds: Normal heart sounds, S1 normal and S2 normal.  Pulmonary:     Breath sounds: Normal breath sounds. No decreased breath sounds, wheezing, rhonchi or rales.  Abdominal:     Palpations: Abdomen is soft.     Tenderness: There is no abdominal tenderness.  Musculoskeletal:     Right hip: Tenderness present. Decreased range of motion.     Right lower leg: No swelling.     Left lower leg: No swelling.  Skin:    General: Skin is warm.     Comments: Open wound with slight drainage right anterior thigh.  Neurological:     Mental Status: She is alert and oriented to person, place, and time.     Comments: Unable to straight leg raise right leg.  Unable to straight leg raise with right leg.     Data Reviewed: Creatinine improved from 1.74 To 1.27, ferritin 326, hemoglobin 6.8 prior to transfusion 8.4 after transfusion, sedimentation rate 109  Family Communication: Updated family at  the bedside  Disposition: Status is: Observation Transfusing blood today.  Will need to see if she can weight-bear.  Awaiting orthopedic consultation.  Planned Discharge Destination: To be determined based on physical therapy evaluation    Time spent: 28 minutes  Author: Loletha Grayer, MD 04/16/2022 1:39 PM  For on call review [email or web address].

## 2022-04-16 NOTE — Assessment & Plan Note (Addendum)
Chronic thrombocytopenia at last platelet count 74

## 2022-04-16 NOTE — Plan of Care (Signed)

## 2022-04-16 NOTE — Assessment & Plan Note (Signed)
I will send off another serum protein electrophoresis with sedimentation rate being very high.

## 2022-04-16 NOTE — Progress Notes (Signed)
Stage VII stool.  Stool for C. difficile ordered Relative hypoglycemia and vomiting.  Start IV fluid hydration.  Hold long-acting insulin tonight and just cover with sliding scale.

## 2022-04-17 DIAGNOSIS — N179 Acute kidney failure, unspecified: Secondary | ICD-10-CM | POA: Diagnosis not present

## 2022-04-17 DIAGNOSIS — G9341 Metabolic encephalopathy: Secondary | ICD-10-CM | POA: Diagnosis not present

## 2022-04-17 DIAGNOSIS — T148XXA Other injury of unspecified body region, initial encounter: Secondary | ICD-10-CM | POA: Diagnosis not present

## 2022-04-17 DIAGNOSIS — M25551 Pain in right hip: Secondary | ICD-10-CM | POA: Diagnosis not present

## 2022-04-17 LAB — GLUCOSE, CAPILLARY
Glucose-Capillary: 77 mg/dL (ref 70–99)
Glucose-Capillary: 109 mg/dL — ABNORMAL HIGH (ref 70–99)
Glucose-Capillary: 94 mg/dL (ref 70–99)
Glucose-Capillary: 98 mg/dL (ref 70–99)
Glucose-Capillary: 152 mg/dL — ABNORMAL HIGH (ref 70–99)

## 2022-04-17 LAB — COMPREHENSIVE METABOLIC PANEL
ALT: 8 U/L (ref 0–44)
AST: 17 U/L (ref 15–41)
Albumin: 3.1 g/dL — ABNORMAL LOW (ref 3.5–5.0)
Alkaline Phosphatase: 82 U/L (ref 38–126)
Anion gap: 8 (ref 5–15)
BUN: 39 mg/dL — ABNORMAL HIGH (ref 8–23)
CO2: 21 mmol/L — ABNORMAL LOW (ref 22–32)
Calcium: 8.3 mg/dL — ABNORMAL LOW (ref 8.9–10.3)
Chloride: 106 mmol/L (ref 98–111)
Creatinine, Ser: 1.43 mg/dL — ABNORMAL HIGH (ref 0.44–1.00)
GFR, Estimated: 34 mL/min — ABNORMAL LOW (ref >60)
Glucose, Bld: 95 mg/dL (ref 70–99)
Potassium: 3.8 mmol/L (ref 3.5–5.1)
Sodium: 135 mmol/L (ref 135–145)
Total Bilirubin: 1 mg/dL (ref 0.3–1.2)
Total Protein: 8.2 g/dL — ABNORMAL HIGH (ref 6.5–8.1)

## 2022-04-17 LAB — CBC
HCT: 26.2 % — ABNORMAL LOW (ref 36.0–46.0)
Hemoglobin: 8.4 g/dL — ABNORMAL LOW (ref 12.0–15.0)
MCH: 25.4 pg — ABNORMAL LOW (ref 26.0–34.0)
MCHC: 32.1 g/dL (ref 30.0–36.0)
MCV: 79.2 fL — ABNORMAL LOW (ref 80.0–100.0)
Platelets: 72 10*3/uL — ABNORMAL LOW (ref 150–400)
RBC: 3.31 MIL/uL — ABNORMAL LOW (ref 3.87–5.11)
RDW: 18.2 % — ABNORMAL HIGH (ref 11.5–15.5)
WBC: 9.8 10*3/uL (ref 4.0–10.5)
nRBC: 0 % (ref 0.0–0.2)

## 2022-04-17 LAB — VANCOMYCIN, RANDOM: Vancomycin Rm: 11 ug/mL

## 2022-04-17 LAB — C-REACTIVE PROTEIN: CRP: 7.6 mg/dL — ABNORMAL HIGH (ref <1.0)

## 2022-04-17 MED ORDER — OXYCODONE HCL 5 MG PO TABS: 2.5000 mg | ORAL_TABLET | Freq: Four times a day (QID) | ORAL | Status: DC | PRN

## 2022-04-17 MED ORDER — VANCOMYCIN HCL IN DEXTROSE 1-5 GM/200ML-% IV SOLN
1000.0000 mg | Freq: Once | INTRAVENOUS | Status: DC
  Filled 2022-04-17: qty 200

## 2022-04-17 NOTE — Assessment & Plan Note (Addendum)
Improved.  Likely secondary to oxycodone.  Changed pain medications over to tramadol.  Use Tylenol first

## 2022-04-17 NOTE — Progress Notes (Signed)
Progress Note   [name] OXB:[id number] DOB: September 05, 1925 DOA: 04/15/2022     1 DOS: the patient was seen and examined on 04/17/2022     Assessment and Plan: * Right hip pain Patient does have a fluid collection anterior to the right hip partially contained within the iliopsoas bursa.  Continue empiric antibiotics.  Case discussed with interventional radiology and they do not recommend draining the fluid because they think it will come right back because of the right hip prosthesis.  Non-healing non-surgical wound Chronic wound.  Sedimentation rate being above 100.  Continue IV antibiotics.  Follow-up wound culture.  Acute metabolic encephalopathy After pain medication today.  Will decrease dose down to 2.5 mg of oxycodone every 6 hours as needed for pain.  Acute kidney injury superimposed on chronic kidney disease ([place]) Acute kidney injury on CKD stage IIIa.  Creatinine 1.74 on admission Down to 1.27 on 04/16/2022.  Creatinine up to 1.43 today.  Continue gentle IV fluids.  Anemia of chronic disease Hemoglobin responded to blood transfusion with hemoglobin up to 8.4.  Type 2 diabetes mellitus with renal manifestations (HCC) Type 2 diabetes mellitus with chronic kidney disease stage IIIa.  Holding Levemir insulin and just using sliding scale at this point.  Thrombocytopenia (HCC) Chronic thrombocytopenia at last platelet count 72  CAD (coronary artery disease) Stable Continue metoprolol  MGUS (monoclonal gammopathy of unknown significance) I sent off another serum protein electrophoresis with sedimentation rate being very high.        Subjective: Patient subjectively felt a little bit better but still with very limited motion in her right hip.  Patient had quite a bit of pain when I moved her right hip.  Physical Exam: Vitals:   04/17/22 0423 04/17/22 0828 04/17/22 1025 04/17/22 1140  BP: (!) 116/91 (!) 103/50 104/64   Pulse: 66 67 60   Resp: '16 18 20   '$ Temp:  98.5 F (36.9 C) 98.4 F (36.9 C) 98.7 F (37.1 C)   TempSrc: Oral Oral    SpO2: 99% 100% 98% 98%  Weight:      Height:       Physical Exam HENT:     Head: Normocephalic.     Mouth/Throat:     Pharynx: No oropharyngeal exudate.  Eyes:     General: Lids are normal.     Conjunctiva/sclera: Conjunctivae normal.  Cardiovascular:     Rate and Rhythm: Normal rate and regular rhythm.     Heart sounds: Normal heart sounds, S1 normal and S2 normal.  Pulmonary:     Breath sounds: Normal breath sounds. No decreased breath sounds, wheezing, rhonchi or rales.  Abdominal:     Palpations: Abdomen is soft.     Tenderness: There is no abdominal tenderness.  Musculoskeletal:     Right hip: Tenderness present. Decreased range of motion.     Right lower leg: No swelling.     Left lower leg: No swelling.  Skin:    General: Skin is warm.     Comments: Open wound with slight drainage right anterior thigh.  Neurological:     Mental Status: She is alert and oriented to person, place, and time.     Comments: Unable to straight leg raise right leg.  Unable to straight leg raise with right leg.     Data Reviewed: Creatinine 1.43, hemoglobin 8.4, platelet count 72  Family Communication: Spoke with patient's daughter at the bedside  Disposition: Status is: Inpatient Remains inpatient appropriate because:  Still having severe pain right hip.  Continue IV antibiotics for right now.  Planned Discharge Destination: Skilled nursing facility    Time spent: 28 minutes Case discussed with interventional radiology and they state and to continue antibiotics but did not recommend draining the fluid. Author: Loletha Grayer, MD 04/17/2022 1:18 PM  For on call review [email or web address].

## 2022-04-17 NOTE — Evaluation (Signed)
Physical Therapy Evaluation Patient Details Name: Gabriela [name] MRN: [id number] DOB: February 25, 1926 Today's Date: 04/17/2022  History of Present Illness  Pt is 30 YOF admitted for R hip pain, anemia, and thrombocytopenia. PMH includes: DM2, CKD4, glaucoma, HTN, CAD, R THA (30 years ago), and nonhealing surgical wound on R thigh  Clinical Impression  Pt presents to PT in bed and agreeable to participate in therapy services. Pt was pleasant and motivated to participate during the session and put forth good effort throughout. Pt requires significantly extended time and effort, w minimal physical assistance and some verbal cueing to perform all aspects of mobility, but is highly motivated to participate to regain independence. Very antalgic gait w extremely short step length, bed to chair distances challenging. Would benefit from skilled PT at SNF to address above deficits in strength, functional mobility, activity tolerance, and balance to promote optimal return to PLOF.      Recommendations for follow up therapy are one component of a multi-disciplinary discharge planning process, led by the attending physician.  Recommendations may be updated based on patient status, additional functional criteria and insurance authorization.  Follow Up Recommendations Skilled nursing-short term rehab (<3 hours/day) Can patient physically be transported by private vehicle: No    Assistance Recommended at Discharge Frequent or constant Supervision/Assistance  Patient can return home with the following  A little help with walking and/or transfers;Assistance with cooking/housework;Assist for transportation;A little help with bathing/dressing/bathroom;Help with stairs or ramp for entrance    Equipment Recommendations None recommended by PT (Pt reports having RW, rollator, and lift chair at home)  Recommendations for Other Services       Functional Status Assessment Patient has had a recent decline in their  functional status and demonstrates the ability to make significant improvements in function in a reasonable and predictable amount of time.     Precautions / Restrictions Precautions Precautions: Fall Restrictions Weight Bearing Restrictions: No      Mobility  Bed Mobility Overal bed mobility: Needs Assistance Bed Mobility: Supine to Sit     Supine to sit: Min assist     General bed mobility comments: able to perform mostly ind, though very slowly and with incr effort. needed minA to pull herself fully upright    Transfers Overall transfer level: Needs assistance Equipment used: Rolling walker (2 wheels) Transfers: Sit to/from Stand Sit to Stand: Min assist           General transfer comment: minA for STS w some cueing for foot placement    Ambulation/Gait Ambulation/Gait assistance: Min assist Gait Distance (Feet): 4 Feet Assistive device: Rolling walker (2 wheels) Gait Pattern/deviations: Step-to pattern, Decreased stance time - right, Decreased step length - left, Knee flexed in stance - right, Antalgic Gait velocity: decr     General Gait Details: very slow, but steady w verbal cueing for sequencing  Stairs            Wheelchair Mobility    Modified Rankin (Stroke Patients Only)       Balance Overall balance assessment: Needs assistance Sitting-balance support: No upper extremity supported, Feet unsupported Sitting balance-Leahy Scale: Good     Standing balance support: Bilateral upper extremity supported, During functional activity, Reliant on assistive device for balance Standing balance-Leahy Scale: Poor                               Pertinent Vitals/Pain Pain Assessment Pain Assessment: No/denies pain (initially  denies but shows visible signs of pain)    Home Living Family/patient expects to be discharged to:: Private residence Living Arrangements: Alone (lives on same property as granddaughter) Available Help at  Discharge: Family;Available PRN/intermittently Type of Home: House Home Access: Level entry       Home Layout: One level Home Equipment: Conservation officer, nature (2 wheels);Rollator (4 wheels);Transport chair      Prior Function Prior Level of Function : Needs assist             Mobility Comments: limited community distance amb w RW ADLs Comments: modI w ADLs/IADLs, has aide in morning 7 days/week     Hand Dominance        Extremity/Trunk Assessment   Upper Extremity Assessment Upper Extremity Assessment: Generalized weakness    Lower Extremity Assessment Lower Extremity Assessment: Generalized weakness    Cervical / Trunk Assessment Cervical / Trunk Assessment: Kyphotic  Communication   Communication: HOH  Cognition Arousal/Alertness: Lethargic, Suspect due to medications Behavior During Therapy: WFL for tasks assessed/performed Overall Cognitive Status: Difficult to assess                                 General Comments: initially very lethergic and difficult to arouse. improved throughout session.        General Comments      Exercises     Assessment/Plan    PT Assessment Patient needs continued PT services  PT Problem List Decreased strength;Decreased activity tolerance;Decreased balance;Decreased mobility;Pain       PT Treatment Interventions DME instruction;Therapeutic activities;Gait training;Therapeutic exercise;Patient/family education;Balance training;Functional mobility training;Neuromuscular re-education    PT Goals (Current goals can be found in the Care Plan section)  Acute Rehab PT Goals Patient Stated Goal: to stay independent PT Goal Formulation: With patient Time For Goal Achievement: 04/30/22 Potential to Achieve Goals: Good    Frequency Min 2X/week     Co-evaluation               AM-PAC PT "6 Clicks" Mobility  Outcome Measure Help needed turning from your back to your side while in a flat bed without using  bedrails?: A Little Help needed moving from lying on your back to sitting on the side of a flat bed without using bedrails?: A Little Help needed moving to and from a bed to a chair (including a wheelchair)?: A Little Help needed standing up from a chair using your arms (e.g., wheelchair or bedside chair)?: A Lot Help needed to walk in hospital room?: A Lot Help needed climbing 3-5 steps with a railing? : Total 6 Click Score: 14    End of Session Equipment Utilized During Treatment: Gait belt Activity Tolerance: Patient tolerated treatment well Patient left: in chair;with call bell/phone within reach;with chair alarm set;with family/visitor present Nurse Communication: Mobility status PT Visit Diagnosis: Muscle weakness (generalized) (M62.81);Pain;Other abnormalities of gait and mobility (R26.89) Pain - Right/Left: Right Pain - part of body: Hip    Time: 3532-9924 PT Time Calculation (min) (ACUTE ONLY): 42 min   Charges:              Glenice Laine MPH, SPT 04/17/22, 12:01 PM

## 2022-04-17 NOTE — Consult Note (Signed)
Pharmacy Antibiotic Note  Gabriela [name] is a 88 y.o. female admitted on 04/15/2022 with  Chronic, nonhealing wound with exposed hardware to right anterior hip .  Pharmacy has been consulted for Vancomycin & unasyn dosing.  Plan: Scr 1.74> 1.27> 1.43    unstable Continue Unasyn 3g IV q12h  for crcl 19.4 ml/min Loading dose of vancomycin 1g IV x1 9/2'@1441'$  Random vancomycin level 9/3'@1224'$ = 11 mcg/ml (~24 hrs post dose) Will order Vancomyicn 1 gram IV x 1 and continue with variable dosing per levels due to unstable renal fxn -f/u Scr, rnadom Vancomycin level with am labs   Temp (24hrs), Avg:98.4 F (36.9 C), Min:97.7 F (36.5 C), Max:98.7 F (37.1 C)  Recent Labs  Lab 04/15/22 0950 04/16/22 0529 04/17/22 0630 04/17/22 1224  WBC 7.2 7.1 9.8  --   CREATININE 1.74* 1.27* 1.43*  --   VANCORANDOM  --   --   --  11     Estimated Creatinine Clearance: 19.4 mL/min (A) (by C-G formula based on SCr of 1.43 mg/dL (H)).    Allergies  Allergen Reactions   Sulfa Antibiotics Rash    Antimicrobials this admission: Unasyn/Vanco (9/2 >>   Dose adjustments this admission: CTM and adjust accordingly. Pt better than last know baseline but significant CKD.  Microbiology results: 9/2 Wound (superficial specimen): sent/pending  9/2 MRSA PCR: sent/pending Cdiff neg  Thank you for allowing pharmacy to be a part of this patient's care.  Jya Hughston A 04/17/2022 2:00 PM

## 2022-04-17 NOTE — Plan of Care (Signed)
  Problem: Education: Goal: Knowledge of General Education information will improve Description: Including pain rating scale, medication(s)/side effects and non-pharmacologic comfort measures Outcome: Completed/Met   Problem: Health Behavior/Discharge Planning: Goal: Ability to manage health-related needs will improve Outcome: Completed/Met   Problem: Clinical Measurements: Goal: Ability to maintain clinical measurements within normal limits will improve Outcome: Completed/Met Goal: Will remain free from infection Outcome: Completed/Met Goal: Diagnostic test results will improve Outcome: Completed/Met Goal: Respiratory complications will improve Outcome: Completed/Met Goal: Cardiovascular complication will be avoided Outcome: Completed/Met   Problem: Activity: Goal: Risk for activity intolerance will decrease Outcome: Completed/Met   Problem: Nutrition: Goal: Adequate nutrition will be maintained Outcome: Completed/Met   Problem: Coping: Goal: Level of anxiety will decrease Outcome: Completed/Met   Problem: Elimination: Goal: Will not experience complications related to bowel motility Outcome: Completed/Met Goal: Will not experience complications related to urinary retention Outcome: Completed/Met   Problem: Pain Managment: Goal: General experience of comfort will improve Outcome: Completed/Met   Problem: Safety: Goal: Ability to remain free from injury will improve Outcome: Completed/Met   Problem: Skin Integrity: Goal: Risk for impaired skin integrity will decrease Outcome: Completed/Met   Problem: Education: Goal: Knowledge of General Education information will improve Description: Including pain rating scale, medication(s)/side effects and non-pharmacologic comfort measures Outcome: Progressing   Problem: Health Behavior/Discharge Planning: Goal: Ability to manage health-related needs will improve Outcome: Progressing   Problem: Clinical  Measurements: Goal: Ability to maintain clinical measurements within normal limits will improve Outcome: Progressing Goal: Will remain free from infection Outcome: Progressing Goal: Diagnostic test results will improve Outcome: Progressing Goal: Respiratory complications will improve Outcome: Progressing Goal: Cardiovascular complication will be avoided Outcome: Progressing   Problem: Activity: Goal: Risk for activity intolerance will decrease Outcome: Progressing   Problem: Nutrition: Goal: Adequate nutrition will be maintained Outcome: Progressing   Problem: Coping: Goal: Level of anxiety will decrease Outcome: Progressing   Problem: Elimination: Goal: Will not experience complications related to bowel motility Outcome: Progressing Goal: Will not experience complications related to urinary retention Outcome: Progressing   Problem: Pain Managment: Goal: General experience of comfort will improve Outcome: Progressing   Problem: Safety: Goal: Ability to remain free from injury will improve Outcome: Progressing   Problem: Skin Integrity: Goal: Risk for impaired skin integrity will decrease Outcome: Progressing

## 2022-04-18 DIAGNOSIS — T148XXA Other injury of unspecified body region, initial encounter: Secondary | ICD-10-CM | POA: Diagnosis not present

## 2022-04-18 DIAGNOSIS — N179 Acute kidney failure, unspecified: Secondary | ICD-10-CM | POA: Diagnosis not present

## 2022-04-18 DIAGNOSIS — G9341 Metabolic encephalopathy: Secondary | ICD-10-CM | POA: Diagnosis not present

## 2022-04-18 DIAGNOSIS — M25551 Pain in right hip: Secondary | ICD-10-CM | POA: Diagnosis not present

## 2022-04-18 LAB — GLUCOSE, CAPILLARY
Glucose-Capillary: 88 mg/dL (ref 70–99)
Glucose-Capillary: 131 mg/dL — ABNORMAL HIGH (ref 70–99)
Glucose-Capillary: 87 mg/dL (ref 70–99)
Glucose-Capillary: 124 mg/dL — ABNORMAL HIGH (ref 70–99)

## 2022-04-18 LAB — VANCOMYCIN, RANDOM
Vancomycin Rm: 19 ug/mL
Vancomycin Rm: 14 ug/mL

## 2022-04-18 LAB — CBC
HCT: 24.6 % — ABNORMAL LOW (ref 36.0–46.0)
Hemoglobin: 7.9 g/dL — ABNORMAL LOW (ref 12.0–15.0)
MCH: 25.3 pg — ABNORMAL LOW (ref 26.0–34.0)
MCHC: 32.1 g/dL (ref 30.0–36.0)
MCV: 78.8 fL — ABNORMAL LOW (ref 80.0–100.0)
Platelets: 74 10*3/uL — ABNORMAL LOW (ref 150–400)
RBC: 3.12 MIL/uL — ABNORMAL LOW (ref 3.87–5.11)
RDW: 18.5 % — ABNORMAL HIGH (ref 11.5–15.5)
WBC: 10 10*3/uL (ref 4.0–10.5)
nRBC: 0 % (ref 0.0–0.2)

## 2022-04-18 LAB — BASIC METABOLIC PANEL
Anion gap: 7 (ref 5–15)
BUN: 37 mg/dL — ABNORMAL HIGH (ref 8–23)
CO2: 23 mmol/L (ref 22–32)
Calcium: 8.3 mg/dL — ABNORMAL LOW (ref 8.9–10.3)
Chloride: 110 mmol/L (ref 98–111)
Creatinine, Ser: 1.42 mg/dL — ABNORMAL HIGH (ref 0.44–1.00)
GFR, Estimated: 34 mL/min — ABNORMAL LOW (ref >60)
Glucose, Bld: 104 mg/dL — ABNORMAL HIGH (ref 70–99)
Potassium: 3.7 mmol/L (ref 3.5–5.1)
Sodium: 140 mmol/L (ref 135–145)

## 2022-04-18 LAB — MRSA NEXT GEN BY PCR, NASAL: MRSA by PCR Next Gen: NOT DETECTED

## 2022-04-18 MED ORDER — TRAMADOL HCL 50 MG PO TABS
50.0000 mg | ORAL_TABLET | Freq: Three times a day (TID) | ORAL | Status: DC | PRN
Start: 2022-04-18 — End: 2022-04-18

## 2022-04-18 MED ORDER — ACETAMINOPHEN 650 MG RE SUPP: 650.0000 mg | Freq: Four times a day (QID) | RECTAL | Status: DC | PRN

## 2022-04-18 MED ORDER — CEFEPIME HCL 2 G IV SOLR
2.0000 g | INTRAVENOUS | Status: DC
  Administered 2022-04-18 – 2022-04-19 (×2): 2 g via INTRAVENOUS
  Filled 2022-04-18: qty 2
  Filled 2022-04-18 (×2): qty 12.5

## 2022-04-18 MED ORDER — TRAMADOL HCL 50 MG PO TABS
50.0000 mg | ORAL_TABLET | Freq: Two times a day (BID) | ORAL | Status: DC | PRN
  Administered 2022-04-18 – 2022-04-20 (×3): 50 mg via ORAL
  Filled 2022-04-18 (×3): qty 1

## 2022-04-18 MED ORDER — ACETAMINOPHEN 325 MG PO TABS
650.0000 mg | ORAL_TABLET | Freq: Four times a day (QID) | ORAL | Status: DC | PRN
  Administered 2022-04-18 – 2022-04-20 (×3): 650 mg via ORAL
  Filled 2022-04-18 (×3): qty 2

## 2022-04-18 NOTE — TOC Progression Note (Signed)
Transition of Care [facility]) - Progression Note    Patient Details  Name: Gabriela [name] MRN: [id number] Date of Birth: 06/27/1926  Transition of Care [facility] CM/SW [place], RN Phone Number: 04/18/2022, 9:55 AM  Clinical Narrative:     Spoke with [name] the patient's grand daughter, they are agreeable for a bed seaech to go to [facility] SNF, she has been along time ago, has been living at home alone FL2 completed, PASSR  obtained Bedsearch sent       Expected Discharge Plan and Services                                                 Social Determinants of Health (SDOH) Interventions    Readmission Risk Interventions     No data to display

## 2022-04-18 NOTE — Progress Notes (Signed)
Progress Note   [name] PZW:[id number] DOB: Apr 12, 1926 DOA: 04/15/2022     2 DOS: the patient was seen and examined on 04/18/2022     Assessment and Plan: * Right hip pain Patient does have a fluid collection anterior to the right hip partially contained within the iliopsoas bursa.  Case discussed with interventional radiology and they do not recommend draining the fluid because they think it will come right back because of the right hip prosthesis.  Orthopedic surgery does not want to do a hip replacement secondary to age and medical issues.  Pseudomonas growing out of the wound.  Change antibiotics to cefepime.  Non-healing non-surgical wound Chronic wound.  Sedimentation rate being above 100.  Change antibiotics to cefepime with Pseudomonas growing out of the wound.  Acute metabolic encephalopathy Improved today.  Likely secondary to oxycodone.  Change pain medications over to tramadol.  Use Tylenol first  Acute kidney injury superimposed on chronic kidney disease ([place]) Acute kidney injury on CKD stage IIIa.  Creatinine 1.74 on admission Down to 1.27 on 04/16/2022.  Creatinine up to 1.42.  Discontinue IV fluids and monitor.  Anemia of chronic disease The patient was given 1 unit of packed red blood cells on 04/16/2022 with good response.  Today's hemoglobin did drift down to 7.9.  Discontinue IV fluids and continue to monitor.  Type 2 diabetes mellitus with renal manifestations (HCC) Type 2 diabetes mellitus with chronic kidney disease stage IIIa.  Holding Levemir insulin and just using sliding scale at this point.  Thrombocytopenia (HCC) Chronic thrombocytopenia at last platelet count 74  CAD (coronary artery disease) Hold metoprolol.  MGUS (monoclonal gammopathy of unknown significance) I sent off another serum protein electrophoresis with sedimentation rate being very high.        Subjective: Patient was able to do a little bit of work with PT yesterday.   Still having pain in the right hip.  Eating better today.  Mental status better today.  Admitted with severe pain in the right hip and inability to walk.  Physical Exam: Vitals:   04/17/22 1140 04/17/22 1627 04/17/22 2344 04/18/22 0817  BP:  (!) 128/51 (!) 122/51 (!) 138/59  Pulse:  65 63 65  Resp:  '18 16 16  '$ Temp:  97.8 F (36.6 C) 98.4 F (36.9 C) 99 F (37.2 C)  TempSrc:  Oral    SpO2: 98% 100% 97% 100%  Weight:  54.9 kg    Height:  '5\' 2"'$  (1.575 m)     Physical Exam HENT:     Head: Normocephalic.     Mouth/Throat:     Pharynx: No oropharyngeal exudate.  Eyes:     General: Lids are normal.     Conjunctiva/sclera: Conjunctivae normal.  Cardiovascular:     Rate and Rhythm: Normal rate and regular rhythm.     Heart sounds: Normal heart sounds, S1 normal and S2 normal.  Pulmonary:     Breath sounds: Normal breath sounds. No decreased breath sounds, wheezing, rhonchi or rales.  Abdominal:     Palpations: Abdomen is soft.     Tenderness: There is no abdominal tenderness.  Musculoskeletal:     Right hip: Tenderness present. Decreased range of motion.     Right lower leg: No swelling.     Left lower leg: No swelling.  Skin:    General: Skin is warm.     Comments: Open wound with slight drainage right anterior thigh.  Neurological:     Mental  Status: She is alert and oriented to person, place, and time.     Comments: Unable to straight leg raise right leg without help from her arms     Data Reviewed: Pseudomonas growing out of wound culture.  Family Communication: Spoke with family on the phone and in the room has not seen the patient.  Disposition: Status is: Inpatient Remains inpatient appropriate because: Physical therapy recommending rehab  Planned Discharge Destination: Skilled nursing facility    Time spent: 28 minutes  Author: Loletha Grayer, MD 04/18/2022 3:00 PM  For on call review [email or web address].

## 2022-04-18 NOTE — NC FL2 (Signed)
[place] LEVEL OF CARE SCREENING TOOL     IDENTIFICATION  Patient Name: Gabriela [name] Birthdate: [birth year] Sex: female Admission Date (Current Location): 04/15/2022  [facility] and [place] Number:  Engineering geologist and Address:  [facility], [address], [place], [place] [address]      Provider Number: [id number]  Attending Physician Name and Address:  Loletha Grayer, MD  Relative Name and Phone Number:  [name] [phone]    Current Level of Care: Hospital Recommended Level of Care: [place] Prior Approval Number:    Date Approved/Denied:   PASRR Number: [id number]  Discharge Plan: SNF    Current Diagnoses: Patient Active Problem List   Diagnosis Date Noted   Acute metabolic encephalopathy 08/67/6195   Thrombocytopenia ([place]) 04/16/2022   Anemia of chronic disease 04/16/2022   Acute kidney injury superimposed on chronic kidney disease ([place]) 04/16/2022   MGUS (monoclonal gammopathy of unknown significance) 04/16/2022   Acute right hip pain 04/16/2022   Right hip pain 04/15/2022   Non-healing non-surgical wound 04/15/2022   Encounter for adjustment or removal of myringotomy device (stent) (tube)    Choledocholithiasis    Common bile duct dilatation    Abnormal findings on imaging of biliary tract    History of revision of total replacement of right hip joint 01/20/2016   Abnormal LFTs (liver function tests) 04/17/2015   Anemia of diabetes 04/17/2015   Atherosclerosis of coronary artery 04/17/2015   Diabetic kidney ([place]) 04/17/2015   Essential (primary) hypertension 04/17/2015   Glaucoma 04/17/2015   HLD (hyperlipidemia) 04/17/2015   Disorder of kidney 04/17/2015   Arthritis, degenerative 04/17/2015   Arthritis, hip 04/26/2013   CAD (coronary artery disease) 04/26/2013   Ischemic mitral regurgitation 04/26/2013   Type 2 diabetes mellitus with renal manifestations (HCC)    Hyperlipidemia      Orientation RESPIRATION BLADDER Height & Weight     Self, Time, Situation, Place  Normal Continent, External catheter Weight: 54.9 kg Height:  '5\' 2"'$  (157.5 cm)  BEHAVIORAL SYMPTOMS/MOOD NEUROLOGICAL BOWEL NUTRITION STATUS      Continent Diet (see dc summary)  AMBULATORY STATUS COMMUNICATION OF NEEDS Skin   Extensive Assist    (non healing wound  right hip)                       Personal Care Assistance Level of Assistance  Bathing, Feeding, Dressing Bathing Assistance: Maximum assistance Feeding assistance: Independent Dressing Assistance: Maximum assistance     Functional Limitations Info             SPECIAL CARE FACTORS FREQUENCY  PT (By licensed PT), OT (By licensed OT)     PT Frequency: 5 times per week OT Frequency: 5 times per week            Contractures Contractures Info: Not present    Additional Factors Info  Code Status, Allergies Code Status Info: DNR Allergies Info: Sulfa Antbiotics           Current Medications (04/18/2022):  This is the current hospital active medication list Current Facility-Administered Medications  Medication Dose Route Frequency Provider Last Rate Last Admin   0.9 %  sodium chloride infusion  250 mL Intravenous PRN Agbata, Tochukwu, MD       acetaminophen (TYLENOL) tablet 650 mg  650 mg Oral Q6H PRN Agbata, Tochukwu, MD   650 mg at 04/16/22 2232   Or   acetaminophen (TYLENOL) suppository 650 mg  650 mg Rectal Q6H PRN Agbata, Tochukwu, MD       albuterol (PROVENTIL) (2.5 MG/3ML) 0.083% nebulizer solution 3 mL  3 mL Nebulization Q4H PRN Agbata, Tochukwu, MD       Ampicillin-Sulbactam (UNASYN) 3 g in sodium chloride 0.9 % 100 mL IVPB  3 g Intravenous Q12H Beers, Shanon Brow, RPH 200 mL/hr at 04/18/22 0037 3 g at 04/18/22 0037   brinzolamide (AZOPT) 1 % ophthalmic suspension 1 drop  1 drop Both Eyes TID Agbata, Tochukwu, MD   1 drop at 04/18/22 0817   And   brimonidine (ALPHAGAN) 0.2 % ophthalmic solution 1 drop  1 drop  Both Eyes TID Agbata, Tochukwu, MD   1 drop at 04/17/22 2145   famotidine (PEPCID) tablet 20 mg  20 mg Oral Daily Loletha Grayer, MD   20 mg at 04/17/22 2446   ferrous sulfate tablet 325 mg  325 mg Oral Q breakfast Agbata, Tochukwu, MD   325 mg at 04/17/22 0837   insulin aspart (novoLOG) injection 0-15 Units  0-15 Units Subcutaneous TID WC Agbata, Tochukwu, MD   3 Units at 04/16/22 1246   ondansetron (ZOFRAN) tablet 4 mg  4 mg Oral Q6H PRN Agbata, Tochukwu, MD       Or   ondansetron (ZOFRAN) injection 4 mg  4 mg Intravenous Q6H PRN Agbata, Tochukwu, MD   4 mg at 04/16/22 1651   oxyCODONE (Oxy IR/ROXICODONE) immediate release tablet 2.5 mg  2.5 mg Oral Q6H PRN Wieting, Richard, MD       sodium chloride flush (NS) 0.9 % injection 3 mL  3 mL Intravenous Q12H Agbata, Tochukwu, MD   3 mL at 04/17/22 2144   sodium chloride flush (NS) 0.9 % injection 3 mL  3 mL Intravenous PRN Agbata, Tochukwu, MD       timolol (TIMOPTIC) 0.5 % ophthalmic solution 1 drop  1 drop Both Eyes Daily Agbata, Tochukwu, MD   1 drop at 04/18/22 0824   vancomycin (VANCOCIN) IVPB 1000 mg/200 mL premix  1,000 mg Intravenous Once Noralee Space, [place] at 04/17/22 1900   vancomycin variable dose per unstable renal function (pharmacist dosing)   Does not apply See admin instructions Lorna Dibble, [facility]         Discharge Medications: Please see discharge summary for a list of discharge medications.  Relevant Imaging Results:  Relevant Lab Results:   Additional Information [id number]  Conception Oms, RN

## 2022-04-19 DIAGNOSIS — N179 Acute kidney failure, unspecified: Secondary | ICD-10-CM | POA: Diagnosis not present

## 2022-04-19 DIAGNOSIS — G9341 Metabolic encephalopathy: Secondary | ICD-10-CM | POA: Diagnosis not present

## 2022-04-19 DIAGNOSIS — M25551 Pain in right hip: Secondary | ICD-10-CM | POA: Diagnosis not present

## 2022-04-19 DIAGNOSIS — T148XXA Other injury of unspecified body region, initial encounter: Secondary | ICD-10-CM | POA: Diagnosis not present

## 2022-04-19 LAB — CBC
HCT: 23.3 % — ABNORMAL LOW (ref 36.0–46.0)
Hemoglobin: 7.3 g/dL — ABNORMAL LOW (ref 12.0–15.0)
MCH: 24.7 pg — ABNORMAL LOW (ref 26.0–34.0)
MCHC: 31.3 g/dL (ref 30.0–36.0)
MCV: 78.7 fL — ABNORMAL LOW (ref 80.0–100.0)
Platelets: 73 10*3/uL — ABNORMAL LOW (ref 150–400)
RBC: 2.96 MIL/uL — ABNORMAL LOW (ref 3.87–5.11)
RDW: 18.7 % — ABNORMAL HIGH (ref 11.5–15.5)
WBC: 8.9 10*3/uL (ref 4.0–10.5)
nRBC: 0 % (ref 0.0–0.2)

## 2022-04-19 LAB — BASIC METABOLIC PANEL
Anion gap: 8 (ref 5–15)
BUN: 37 mg/dL — ABNORMAL HIGH (ref 8–23)
CO2: 20 mmol/L — ABNORMAL LOW (ref 22–32)
Calcium: 8.2 mg/dL — ABNORMAL LOW (ref 8.9–10.3)
Chloride: 112 mmol/L — ABNORMAL HIGH (ref 98–111)
Creatinine, Ser: 1.29 mg/dL — ABNORMAL HIGH (ref 0.44–1.00)
GFR, Estimated: 39 mL/min — ABNORMAL LOW (ref >60)
Glucose, Bld: 116 mg/dL — ABNORMAL HIGH (ref 70–99)
Potassium: 3.9 mmol/L (ref 3.5–5.1)
Sodium: 140 mmol/L (ref 135–145)

## 2022-04-19 LAB — GLUCOSE, CAPILLARY
Glucose-Capillary: 117 mg/dL — ABNORMAL HIGH (ref 70–99)
Glucose-Capillary: 115 mg/dL — ABNORMAL HIGH (ref 70–99)
Glucose-Capillary: 129 mg/dL — ABNORMAL HIGH (ref 70–99)
Glucose-Capillary: 89 mg/dL (ref 70–99)

## 2022-04-19 LAB — PREPARE RBC (CROSSMATCH)

## 2022-04-19 MED ORDER — SODIUM CHLORIDE 0.9% IV SOLUTION: Freq: Once | INTRAVENOUS | Status: AC

## 2022-04-19 MED ORDER — FUROSEMIDE 10 MG/ML IJ SOLN
40.0000 mg | Freq: Once | INTRAMUSCULAR | Status: AC
  Administered 2022-04-19: 40 mg via INTRAVENOUS
  Filled 2022-04-19: qty 4

## 2022-04-19 NOTE — Hospital Course (Signed)
89 year old female with history of type 2 diabetes mellitus with hyperlipidemia hypertension glaucoma arthritis.  She has a chronic right hip replacement with fluid collection and had a nonhealing wound anterior right thigh.  He came into the hospital with difficulty ambulating and right hip pain.  I sent a culture from her right thigh which grew out Pseudomonas.  I initially started aggressive antibiotics and change antibiotics to cefepime once Pseudomonas came back.  Patient was seen in consultation by orthopedic surgery who did not want to do a hip replacement at this time.  Dialysis spoke with interventional radiology and they did not want to drain the fluid collection since it is chronic.  Patient now starting to walk with physical therapy

## 2022-04-19 NOTE — Plan of Care (Signed)

## 2022-04-19 NOTE — Progress Notes (Addendum)
Progress Note   [name] DOB: October 03, 1925 DOA: 04/15/2022     3 DOS: the patient was seen and examined on 04/19/2022   Brief hospital course: 88 year old female with history of type 2 diabetes mellitus with hyperlipidemia hypertension glaucoma arthritis.  She has a chronic right hip replacement with fluid collection and had a nonhealing wound anterior right thigh.  He came into the hospital with difficulty ambulating and right hip pain.  I sent a culture from her right thigh which grew out Pseudomonas.  I initially started aggressive antibiotics and change antibiotics to cefepime once Pseudomonas came back.  Patient was seen in consultation by orthopedic surgery who did not want to do a hip replacement at this time.  Dialysis spoke with interventional radiology and they did not want to drain the fluid collection since it is chronic.  Patient now starting to walk with physical therapy  Assessment and Plan: * Right hip pain Patient does have a fluid collection anterior to the right hip partially contained within the iliopsoas bursa.  Case discussed with interventional radiology and they do not recommend draining the fluid because they think it will come right back because of the right hip prosthesis.  Orthopedic surgery does not want to do a hip replacement secondary to age and medical issues.  Pseudomonas growing out of the wound.  Changed antibiotics to cefepime.  We will consult infectious disease specialist to help out with treatment plan.  Non-healing non-surgical wound Chronic wound.  Sedimentation rate being above 100.  Changed antibiotics to cefepime with Pseudomonas growing out of the wound.  Acute metabolic encephalopathy Improved.  Likely secondary to oxycodone.  Changed pain medications over to tramadol.  Use Tylenol first  Acute kidney injury superimposed on chronic kidney disease ([place]) Acute kidney injury on CKD stage IIIa.  Creatinine 1.74 on admission Down  to 1.27 on 04/16/2022.  Creatinine up to 1.42 on 04/18/2022.  Creatinine 1.29 on 04/19/2022  Anemia of chronic disease The patient was given 1 unit of packed red blood cells on 04/16/2022 with good response.  Today's hemoglobin did drift down to 7.3.  I will give another unit of packed red blood cells today.  Type 2 diabetes mellitus with renal manifestations (HCC) Type 2 diabetes mellitus with chronic kidney disease stage IIIa.  Holding Levemir insulin and just using sliding scale at this point.  Looking back last hemoglobin A1c was 6.2 on 12/27/2021.  May be able to do diet control upon going home  Thrombocytopenia (HCC) Chronic thrombocytopenia at last platelet count 74  CAD (coronary artery disease) Hold metoprolol.  MGUS (monoclonal gammopathy of unknown significance) I sent off another serum protein electrophoresis with sedimentation rate being very high.        Subjective: Still with limited range of motion right hip.  Still with some pain.  Did well yesterday with Tylenol alone only needed tramadol at night.  Walked 20 feet with PT.  Physical Exam: Vitals:   04/19/22 0819 04/19/22 1345 04/19/22 1419 04/19/22 1531  BP: (!) 140/51 (!) 153/58 (!) 153/62 (!) 161/56  Pulse: 61 63 63 63  Resp: '16 18 18 16  '$ Temp: 98.2 F (36.8 C) 98.5 F (36.9 C) 98.7 F (37.1 C) 98.7 F (37.1 C)  TempSrc:  Oral Oral   SpO2: 100% 100% 99% 100%  Weight:      Height:       Physical Exam HENT:     Head: Normocephalic.     Mouth/Throat:  Pharynx: No oropharyngeal exudate.  Eyes:     General: Lids are normal.     Conjunctiva/sclera: Conjunctivae normal.  Cardiovascular:     Rate and Rhythm: Normal rate and regular rhythm.     Heart sounds: Normal heart sounds, S1 normal and S2 normal.  Pulmonary:     Breath sounds: Normal breath sounds. No decreased breath sounds, wheezing, rhonchi or rales.  Abdominal:     Palpations: Abdomen is soft.     Tenderness: There is no abdominal tenderness.   Musculoskeletal:     Right hip: Tenderness present. Decreased range of motion.     Right lower leg: No swelling.     Left lower leg: No swelling.  Skin:    General: Skin is warm.     Comments: Open wound with slight drainage right anterior thigh.  Neurological:     Mental Status: She is alert and oriented to person, place, and time.     Comments: Unable to straight leg raise right leg without help from her arms     Data Reviewed: Pseudomonas sensitive to all antibiotics tested Creatinine 1.29, CO2 20, chloride 112 White blood cell count 8.9, platelet count 73, hemoglobin 7.3  Family Communication: Spoke with family at the bedside  Disposition: Status is: Inpatient Remains inpatient appropriate because: Need to set up a treatment plan.  We will consult infectious disease specialist.  Planned Discharge Destination: Rehab    Time spent: 28 minutes  Author: Loletha Grayer, MD 04/19/2022 3:41 PM  For on call review [email or web address].

## 2022-04-19 NOTE — Progress Notes (Signed)
Physical Therapy Treatment Patient Details Name: TALITA RECHT MRN: [id number] DOB: 1925-11-06 Today's Date: 04/19/2022   History of Present Illness Pt is 64 YOF admitted for R hip pain, anemia, and thrombocytopenia. PMH includes: DM2, CKD4, glaucoma, HTN, CAD, THA (30 years ago), and nonhealing surgical wound on R thigh    PT Comments    The pt presents this session with no complaints of pain. She requires increased time for bed mobility, transfers, and gait. At this time the pt is demonstrating improved gait distances and tolerance to OOB mobility. She continues to demonstrate impaired dynamic balance and gait deviations. Prior to admission the patient was Mod I in her home alone. At this time recommending that the patient d/c to SNF in order to optimize functional mobility to return home with safety. PT will continue to follow.    Recommendations for follow up therapy are one component of a multi-disciplinary discharge planning process, led by the attending physician.  Recommendations may be updated based on patient status, additional functional criteria and insurance authorization.  Follow Up Recommendations  Skilled nursing-short term rehab (<3 hours/day) Can patient physically be transported by private vehicle: Yes   Assistance Recommended at Discharge Frequent or constant Supervision/Assistance  Patient can return home with the following A little help with walking and/or transfers;Assistance with cooking/housework;Assist for transportation;A little help with bathing/dressing/bathroom;Help with stairs or ramp for entrance   Equipment Recommendations  None recommended by PT    Recommendations for Other Services       Precautions / Restrictions Precautions Precautions: Fall Restrictions Weight Bearing Restrictions: No     Mobility  Bed Mobility Overal bed mobility: Needs Assistance Bed Mobility: Supine to Sit     Supine to sit: HOB elevated, Min assist     General bed  mobility comments: Pt requires increased time, assistance to achieve the upright position, HOB elevated.    Transfers Overall transfer level: Needs assistance Equipment used: Rolling walker (2 wheels) Transfers: Sit to/from Stand Sit to Stand: Min assist           General transfer comment: Assistance for steadying RW during sit<>stand transition.    Ambulation/Gait Ambulation/Gait assistance: Min guard Gait Distance (Feet): 20 Feet Assistive device: Rolling walker (2 wheels) Gait Pattern/deviations: Step-through pattern, Decreased step length - right       General Gait Details: Pt reporting no pain this session. Verbal cues for increased WB through UE during L swing phase.   Stairs             Wheelchair Mobility    Modified Rankin (Stroke Patients Only)       Balance Overall balance assessment: Needs assistance Sitting-balance support: Feet supported, No upper extremity supported Sitting balance-Leahy Scale: Good     Standing balance support: During functional activity, Reliant on assistive device for balance Standing balance-Leahy Scale: Fair                              Cognition Arousal/Alertness: Awake/alert Behavior During Therapy: WFL for tasks assessed/performed Overall Cognitive Status: Within Functional Limits for tasks assessed                                 General Comments: alert and oriented        Exercises Other Exercises Other Exercises: Educated family on d/c plan. The pt is demonstrating ability to d/c home with  24/7 family care, however, because the patient does not have this level of support at this time, recommending SNF. Family is agreeable to this plan. Other Exercises: Family and patient educated to begin ambulation to the bathroom for voiding needs to improve tolerance to upright mobility.    General Comments        Pertinent Vitals/Pain Pain Assessment Pain Assessment: No/denies pain     Home Living                          Prior Function            PT Goals (current goals can now be found in the care plan section) Acute Rehab PT Goals Patient Stated Goal: to stay independent PT Goal Formulation: With patient Time For Goal Achievement: 04/30/22 Potential to Achieve Goals: Good Progress towards PT goals: Progressing toward goals    Frequency    Min 2X/week      PT Plan Current plan remains appropriate    Co-evaluation              AM-PAC PT "6 Clicks" Mobility   Outcome Measure  Help needed turning from your back to your side while in a flat bed without using bedrails?: A Little Help needed moving from lying on your back to sitting on the side of a flat bed without using bedrails?: A Little Help needed moving to and from a bed to a chair (including a wheelchair)?: A Little Help needed standing up from a chair using your arms (e.g., wheelchair or bedside chair)?: A Little Help needed to walk in hospital room?: A Little Help needed climbing 3-5 steps with a railing? : A Lot 6 Click Score: 17    End of Session Equipment Utilized During Treatment: Gait belt Activity Tolerance: Patient tolerated treatment well Patient left: in chair;with call bell/phone within reach;with chair alarm set;with family/visitor present;with nursing/sitter in room Nurse Communication: Mobility status PT Visit Diagnosis: Muscle weakness (generalized) (M62.81);Pain;Other abnormalities of gait and mobility (R26.89) Pain - Right/Left: Right Pain - part of body: Hip     Time: 4782-9562 PT Time Calculation (min) (ACUTE ONLY): 35 min  Charges:  $Gait Training: 23-37 mins                     9:24 AM, 04/19/22 Akeela Busk A. Saverio Danker PT, DPT Physical Therapist - [facility]    Rivers Hamrick A Archita Lomeli 04/19/2022, 9:21 AM

## 2022-04-19 NOTE — Care Management Important Message (Signed)
Important Message  Patient Details  Name: Gabriela [name] MRN: [id number] Date of Birth: [birth year]   Medicare Important Message Given:  N/A - LOS <3 / Initial given by admissions     [name] 04/19/2022, 10:06 AM

## 2022-04-19 NOTE — Consult Note (Addendum)
NAME: Gabriela [name]  DOB: Feb 22, 1926  MRN: [id number]  Date/Time: 04/19/2022 3:58 PM  REQUESTING PROVIDER: Dr.Wieting Subjective:  REASON FOR CONSULT: pseudomonas infection ? Gabriela [name] is a 88 y.o. with  history of DM, HTN, glaucoma, CABG/mitral valve repair chronic rt thigh wound for more than 6 years  She complains of pain in the right hip.  She has chronic arthritis. She had rt hip replacement many years ago and later had a revision. The site healed well.  She has had this wound for 6 years and had seen ID in the past- at one point was on Doxycycline but none since 2021 She is here because of recent worsening of pain rt hip and difficulty in walking. She has some discharge from the wound. She denies any fever or chills  04/15/22  BP 149/54 !  Temp 97.8 F (36.6 C)  Pulse Rate 64  Resp 18  SpO2 97 %     Latest Reference Range & Units 04/15/22  WBC 4.0 - 10.5 K/uL 7.2  Hemoglobin 12.0 - 15.0 g/dL 7.2 (L) [1]  HCT 36.0 - 46.0 % 23.1 (L)  Platelets 150 - 400 K/uL 86 (L) [2]  Creatinine 0.44 - 1.00 mg/dL 1.74 (H)  CT of rt hip  Chronic osteolysis surrounding the acetabular cup  2. No definite acute fracture or dislocation. 3. Chronic fluid collection anterior to the right hip appears partially contained within the iliopsoas bursa and may relate to bursitis and/or particle disease  Past Medical History:  Diagnosis Date   Arthritis    Diabetes mellitus ([place])    Glaucoma    Hyperlipidemia    Hypertension    Shortness of breath    with exertion    Past Surgical History:  Procedure Laterality Date   CARDIAC CATHETERIZATION  04/22/13   CHOLECYSTECTOMY     CORONARY ARTERY BYPASS GRAFT N/A 05/31/2013   Procedure: Coronary Artery Bypass Grafting Times Three Using Left Internal Mammary Artery and Right Saphenous Leg Vein Harvested Endoscopically;  Surgeon: Ivin Poot, MD;  Location: [place];  Service: Open Heart Surgery;  Laterality: N/A;   ERCP N/A 10/20/2020    Procedure: ENDOSCOPIC RETROGRADE CHOLANGIOPANCREATOGRAPHY (ERCP);  Surgeon: Lucilla Lame, MD;  Location: [facility] ENDOSCOPY;  Service: Endoscopy;  Laterality: N/A;   ERCP N/A 12/22/2020   Procedure: ENDOSCOPIC RETROGRADE CHOLANGIOPANCREATOGRAPHY (ERCP);  Surgeon: Lucilla Lame, MD;  Location: [facility] ENDOSCOPY;  Service: Endoscopy;  Laterality: N/A;   EYE SURGERY     HIP SURGERY     right   INTRAOPERATIVE TRANSESOPHAGEAL ECHOCARDIOGRAM N/A 05/31/2013   Procedure: INTRAOPERATIVE TRANSESOPHAGEAL ECHOCARDIOGRAM;  Surgeon: Ivin Poot, MD;  Location: [place];  Service: Open Heart Surgery;  Laterality: N/A;   JOINT REPLACEMENT     MITRAL VALVE REPAIR N/A 05/31/2013   Procedure: MITRAL VALVE REPAIR (MVR);  Surgeon: Ivin Poot, MD;  Location: [place];  Service: Open Heart Surgery;  Laterality: N/A;   TONSILLECTOMY      Social History   Socioeconomic History   Marital status: Widowed    Spouse name: Not on file   Number of children: 7   Years of education: Not on file   Highest education level: 11th grade  Occupational History   Occupation: retired  Tobacco Use   Smoking status: Never   Smokeless tobacco: Never  Vaping Use   Vaping Use: Never used  Substance and Sexual Activity   Alcohol use: No   Drug use: No   Sexual activity: Never  Other Topics Concern   Not on file  Social History Narrative   Not on file   Social Determinants of Health   Financial Resource Strain: Low Risk  (09/07/2021)   Overall Financial Resource Strain (CARDIA)    Difficulty of Paying Living Expenses: Not hard at all  Food Insecurity: No Food Insecurity (09/07/2021)   Hunger Vital Sign    Worried About Running Out of Food in the Last Year: Never true    Ran Out of Food in the Last Year: Never true  Transportation Needs: No Transportation Needs (09/07/2021)   PRAPARE - Hydrologist (Medical): No    Lack of Transportation (Non-Medical): No  Physical Activity: Inactive (09/07/2021)    Exercise Vital Sign    Days of Exercise per Week: 0 days    Minutes of Exercise per Session: 0 min  Stress: No Stress Concern Present (09/07/2021)   [place]    Feeling of Stress : Not at all  Social Connections: Moderately Isolated (09/07/2021)   Social Connection and Isolation Panel [NHANES]    Frequency of Communication with Friends and Family: More than three times a week    Frequency of Social Gatherings with Friends and Family: More than three times a week    Attends Religious Services: More than 4 times per year    Active Member of [employer] or Organizations: No    Attends Archivist Meetings: Not on file    Marital Status: Widowed  Intimate Partner Violence: Not At Risk (09/07/2021)   Humiliation, Afraid, Rape, and Kick questionnaire    Fear of Current or Ex-Partner: No    Emotionally Abused: No    Physically Abused: No    Sexually Abused: No    Family History  Problem Relation Age of Onset   Diabetes Sister    Hypertension Mother    Allergies  Allergen Reactions   Sulfa Antibiotics Rash   I? Current Facility-Administered Medications  Medication Dose Route Frequency Provider Last Rate Last Admin   0.9 %  sodium chloride infusion  250 mL Intravenous PRN Agbata, Tochukwu, MD       acetaminophen (TYLENOL) tablet 650 mg  650 mg Oral Q6H PRN Loletha Grayer, MD   650 mg at 04/18/22 1733   Or   acetaminophen (TYLENOL) suppository 650 mg  650 mg Rectal Q6H PRN Loletha Grayer, MD       albuterol (PROVENTIL) (2.5 MG/3ML) 0.083% nebulizer solution 3 mL  3 mL Nebulization Q4H PRN Agbata, Tochukwu, MD       brinzolamide (AZOPT) 1 % ophthalmic suspension 1 drop  1 drop Both Eyes TID Agbata, Tochukwu, MD   1 drop at 04/19/22 0926   And   brimonidine (ALPHAGAN) 0.2 % ophthalmic solution 1 drop  1 drop Both Eyes TID Agbata, Tochukwu, MD   1 drop at 04/19/22 0927   ceFEPIme (MAXIPIME) 2 g in sodium chloride  0.9 % 100 mL IVPB  2 g Intravenous Q24H Rito Ehrlich A, RPH   Stopped at 04/18/22 1756   famotidine (PEPCID) tablet 20 mg  20 mg Oral Daily Loletha Grayer, MD   20 mg at 04/19/22 4098   ferrous sulfate tablet 325 mg  325 mg Oral Q breakfast Agbata, Tochukwu, MD   325 mg at 04/19/22 0800   insulin aspart (novoLOG) injection 0-15 Units  0-15 Units Subcutaneous TID WC Agbata, Tochukwu, MD   2 Units at 04/18/22 1228  ondansetron (ZOFRAN) tablet 4 mg  4 mg Oral Q6H PRN Agbata, Tochukwu, MD       Or   ondansetron (ZOFRAN) injection 4 mg  4 mg Intravenous Q6H PRN Agbata, Tochukwu, MD   4 mg at 04/16/22 1651   sodium chloride flush (NS) 0.9 % injection 3 mL  3 mL Intravenous Q12H Agbata, Tochukwu, MD   3 mL at 04/19/22 0973   sodium chloride flush (NS) 0.9 % injection 3 mL  3 mL Intravenous PRN Agbata, Tochukwu, MD       timolol (TIMOPTIC) 0.5 % ophthalmic solution 1 drop  1 drop Both Eyes Daily Agbata, Tochukwu, MD   1 drop at 04/19/22 5329   traMADol (ULTRAM) tablet 50 mg  50 mg Oral Q12H PRN Loletha Grayer, MD   50 mg at 04/18/22 2250     Abtx:  Anti-infectives (From admission, onward)    Start     Dose/Rate Route Frequency Ordered Stop   04/18/22 1600  ceFEPIme (MAXIPIME) 2 g in sodium chloride 0.9 % 100 mL IVPB        2 g 200 mL/hr over 30 Minutes Intravenous Every 24 hours 04/18/22 1445     04/17/22 1500  vancomycin (VANCOCIN) IVPB 1000 mg/200 mL premix  Status:  Discontinued        1,000 mg 200 mL/hr over 60 Minutes Intravenous  Once 04/17/22 1408 04/18/22 1442   04/16/22 1300  vancomycin (VANCOCIN) IVPB 1000 mg/200 mL premix        1,000 mg 200 mL/hr over 60 Minutes Intravenous  Once 04/16/22 1143 04/16/22 1900   04/16/22 1100  Ampicillin-Sulbactam (UNASYN) 3 g in sodium chloride 0.9 % 100 mL IVPB  Status:  Discontinued        3 g 200 mL/hr over 30 Minutes Intravenous Every 12 hours 04/16/22 1012 04/18/22 1442   04/16/22 1100  vancomycin (VANCOCIN) IVPB 1000 mg/200 mL premix   Status:  Discontinued        1,000 mg 200 mL/hr over 60 Minutes Intravenous  Once 04/16/22 1012 04/16/22 1143   04/16/22 1021  vancomycin variable dose per unstable renal function (pharmacist dosing)  Status:  Discontinued         Does not apply See admin instructions 04/16/22 1021 04/18/22 1442       REVIEW OF SYSTEMS:  Const: negative fever, negative chills, negative weight loss Eyes: negative diplopia or visual changes, negative eye pain ENT: negative coryza, negative sore throat Resp: negative cough, hemoptysis, dyspnea Cards: negative for chest pain, palpitations, lower extremity edema GU: negative for frequency, dysuria and hematuria GI: Negative for abdominal pain, diarrhea, bleeding, constipation Skin: negative for rash and pruritus Heme: negative for easy bruising and gum/nose bleeding MS: pain rt hip Neurolo:negative for headaches, dizziness, vertigo, memory problems  Psych: negative for feelings of anxiety, depression  Endocrine:  diabetes Allergy/Immunology- sulfa Objective:  VITALS:  BP (!) 161/56 (BP Location: Left Arm)   Pulse 63   Temp 98.7 F (37.1 C)   Resp 16   Ht '5\' 2"'$  (1.575 m)   Wt 54.9 kg   SpO2 100%   BMI 22.15 kg/m   PHYSICAL EXAM:  General: Alert, cooperative, no distress, appears stated age.  Head: Normocephalic, without obvious abnormality, atraumatic. Eyes: Conjunctivae clear, anicteric sclerae. Pupils are equal ENT Nares normal. No drainage or sinus tenderness. Lips, mucosa, and tongue normal. No Thrush Neck: Supple, symmetrical, no adenopathy, thyroid: non tender no carotid bruit and no JVD. Back: No CVA tenderness.  Lungs: Clear to auscultation bilaterally. No Wheezing or Rhonchi. No rales. Heart: Regular rate and rhythm, no murmur, rub or gallop. Abdomen: Soft, non-tender,not distended. Bowel sounds normal. No masses Extremities: rt  lateral thigh - wound with discharge Skin: No rashes or lesions. Or bruising Lymph: Cervical,  supraclavicular normal. Neurologic: Grossly non-focal Pertinent Labs Lab Results CBC    Component Value Date/Time   WBC 8.9 04/19/2022 0611   RBC 2.96 (L) 04/19/2022 0611   HGB 7.3 (L) 04/19/2022 0611   HGB 7.4 (L) 12/27/2021 1136   HCT 23.3 (L) 04/19/2022 0611   HCT 23.4 (L) 12/27/2021 1136   PLT 73 (L) 04/19/2022 0611   PLT 94 (LL) 12/27/2021 1136   MCV 78.7 (L) 04/19/2022 0611   MCV 77 (L) 12/27/2021 1136   MCH 24.7 (L) 04/19/2022 0611   MCHC 31.3 04/19/2022 0611   RDW 18.7 (H) 04/19/2022 0611   RDW 16.6 (H) 12/27/2021 1136   LYMPHSABS 1.9 04/15/2022 0950   LYMPHSABS 2.2 12/27/2021 1136   MONOABS 1.4 (H) 04/15/2022 0950   EOSABS 0.0 04/15/2022 0950   EOSABS 0.0 12/27/2021 1136   BASOSABS 0.0 04/15/2022 0950   BASOSABS 0.0 12/27/2021 1136       Latest Ref Rng & Units 04/19/2022    6:11 AM 04/18/2022    4:26 AM 04/17/2022    6:30 AM  CMP  Glucose 70 - 99 mg/dL 116  104  95   BUN 8 - 23 mg/dL 37  37  39   Creatinine 0.44 - 1.00 mg/dL 1.29  1.42  1.43   Sodium 135 - 145 mmol/L 140  140  135   Potassium 3.5 - 5.1 mmol/L 3.9  3.7  3.8   Chloride 98 - 111 mmol/L 112  110  106   CO2 22 - 32 mmol/L '20  23  21   '$ Calcium 8.9 - 10.3 mg/dL 8.2  8.3  8.3   Total Protein 6.5 - 8.1 g/dL   8.2   Total Bilirubin 0.3 - 1.2 mg/dL   1.0   Alkaline Phos 38 - 126 U/L   82   AST 15 - 41 U/L   17   ALT 0 - 44 U/L   8       Microbiology: Recent Results (from the past 240 hour(s))  Aerobic/Anaerobic Culture w Gram Stain (surgical/deep wound)     Status: None (Preliminary result)   Collection Time: 04/16/22  6:47 PM   Specimen: Wound  Result Value Ref Range Status   Specimen Description   Final    WOUND Performed at [facility], [address], [place], [place] [address]    Special Requests   Final    THIGH Performed at [facility], [address] [place], [place] [address]    Gram Stain   Final    RARE WBC PRESENT,BOTH PMN AND MONONUCLEAR NO ORGANISMS  SEEN    Culture   Final    FEW PSEUDOMONAS AERUGINOSA CULTURE REINCUBATED FOR BETTER GROWTH Performed at [facility], [place]. [address], [place], [place] [address]    Report Status PENDING  Incomplete   Organism ID, Bacteria PSEUDOMONAS AERUGINOSA  Final      Susceptibility   Pseudomonas aeruginosa - MIC*    CEFTAZIDIME 2 SENSITIVE Sensitive     CIPROFLOXACIN <=0.25 SENSITIVE Sensitive     GENTAMICIN <=1 SENSITIVE Sensitive     IMIPENEM 2 SENSITIVE Sensitive     PIP/TAZO 8 SENSITIVE Sensitive  CEFEPIME 2 SENSITIVE Sensitive     * FEW PSEUDOMONAS AERUGINOSA  C Difficile Quick Screen w PCR reflex     Status: None   Collection Time: 04/16/22  8:05 PM   Specimen: STOOL  Result Value Ref Range Status   C Diff antigen NEGATIVE NEGATIVE Final   C Diff toxin NEGATIVE NEGATIVE Final   C Diff interpretation No C. difficile detected.  Final    Comment: Performed at [facility], [address] [place], [place] [address]  MRSA Next Gen by PCR, Nasal     Status: None   Collection Time: 04/18/22 10:38 AM   Specimen: Nasal Mucosa; Nasal Swab  Result Value Ref Range Status   MRSA by PCR Next Gen NOT DETECTED NOT DETECTED Final    Comment: (NOTE) The GeneXpert MRSA Assay (FDA approved for NASAL specimens only), is one component of a comprehensive MRSA colonization surveillance program. It is not intended to diagnose MRSA infection nor to guide or monitor treatment for MRSA infections. Test performance is not FDA approved in patients less than 10 years old. Performed at [facility], [place], [place] [address]     IMAGING RESULTS: Rt hip Ct Chronic fluid collection anterior to rt hip Partially contained in rt iliopsoas bursa Chronic osteolysis of the acetabular cup I have personally reviewed the films ? Impression/Recommendation ? Rt thigh -Chronic  lateral wound - present for 6 years Worsening of rt hip pain recently Concern for fistula  to the bone- CT scan does not show  that. There is fluid collection at the iliopsoas bursa Pseudomonas in the wound culture. Currently on cefepime- may be able to change to cipro for 4-6 weeks No surgical intervention or IR procedure considered Will discuss with IR  Anemia   DM- on insulin   HTn on metoprolol   CAD- S/p CABG  Mitral valve repair  CKD ? ___________________________________________________ Discussed with patient, and her son Note:  This document was prepared using Dragon voice recognition software and may include unintentional dictation errors.

## 2022-04-20 DIAGNOSIS — D638 Anemia in other chronic diseases classified elsewhere: Secondary | ICD-10-CM | POA: Diagnosis not present

## 2022-04-20 DIAGNOSIS — M25551 Pain in right hip: Secondary | ICD-10-CM | POA: Diagnosis not present

## 2022-04-20 DIAGNOSIS — T148XXA Other injury of unspecified body region, initial encounter: Secondary | ICD-10-CM | POA: Diagnosis not present

## 2022-04-20 DIAGNOSIS — G9341 Metabolic encephalopathy: Secondary | ICD-10-CM | POA: Diagnosis not present

## 2022-04-20 LAB — TYPE AND SCREEN
ABO/RH(D): O POS
Antibody Screen: NEGATIVE
Unit division: 0
Unit division: 0

## 2022-04-20 LAB — CBC
HCT: 27.6 % — ABNORMAL LOW (ref 36.0–46.0)
Hemoglobin: 9 g/dL — ABNORMAL LOW (ref 12.0–15.0)
MCH: 25.9 pg — ABNORMAL LOW (ref 26.0–34.0)
MCHC: 32.6 g/dL (ref 30.0–36.0)
MCV: 79.5 fL — ABNORMAL LOW (ref 80.0–100.0)
Platelets: 73 10*3/uL — ABNORMAL LOW (ref 150–400)
RBC: 3.47 MIL/uL — ABNORMAL LOW (ref 3.87–5.11)
RDW: 18.7 % — ABNORMAL HIGH (ref 11.5–15.5)
WBC: 9.1 10*3/uL (ref 4.0–10.5)
nRBC: 0 % (ref 0.0–0.2)

## 2022-04-20 LAB — PROTEIN ELECTROPHORESIS, SERUM
A/G Ratio: 0.7 (ref 0.7–1.7)
Albumin ELP: 3.2 g/dL (ref 2.9–4.4)
Alpha-1-Globulin: 0.3 g/dL (ref 0.0–0.4)
Alpha-2-Globulin: 0.8 g/dL (ref 0.4–1.0)
Beta Globulin: 1.1 g/dL (ref 0.7–1.3)
Gamma Globulin: 2.3 g/dL — ABNORMAL HIGH (ref 0.4–1.8)
Globulin, Total: 4.4 g/dL — ABNORMAL HIGH (ref 2.2–3.9)
M-Spike, %: 0.5 g/dL — ABNORMAL HIGH
Total Protein ELP: 7.6 g/dL (ref 6.0–8.5)

## 2022-04-20 LAB — BASIC METABOLIC PANEL
Anion gap: 9 (ref 5–15)
BUN: 33 mg/dL — ABNORMAL HIGH (ref 8–23)
CO2: 20 mmol/L — ABNORMAL LOW (ref 22–32)
Calcium: 8.5 mg/dL — ABNORMAL LOW (ref 8.9–10.3)
Chloride: 108 mmol/L (ref 98–111)
Creatinine, Ser: 1.22 mg/dL — ABNORMAL HIGH (ref 0.44–1.00)
GFR, Estimated: 41 mL/min — ABNORMAL LOW (ref >60)
Glucose, Bld: 94 mg/dL (ref 70–99)
Potassium: 3.5 mmol/L (ref 3.5–5.1)
Sodium: 137 mmol/L (ref 135–145)

## 2022-04-20 LAB — GLUCOSE, CAPILLARY
Glucose-Capillary: 89 mg/dL (ref 70–99)
Glucose-Capillary: 120 mg/dL — ABNORMAL HIGH (ref 70–99)
Glucose-Capillary: 155 mg/dL — ABNORMAL HIGH (ref 70–99)
Glucose-Capillary: 63 mg/dL — ABNORMAL LOW (ref 70–99)
Glucose-Capillary: 68 mg/dL — ABNORMAL LOW (ref 70–99)
Glucose-Capillary: 80 mg/dL (ref 70–99)

## 2022-04-20 LAB — BPAM RBC
Blood Product Expiration Date: 202310012359
Blood Product Expiration Date: 202310102359
ISSUE DATE / TIME: 202309020946
ISSUE DATE / TIME: 202309051358
Unit Type and Rh: 5100
Unit Type and Rh: 5100

## 2022-04-20 LAB — IRON AND TIBC
Iron: 37 ug/dL (ref 28–170)
Saturation Ratios: 20 % (ref 10.4–31.8)
TIBC: 186 ug/dL — ABNORMAL LOW (ref 250–450)
UIBC: 149 ug/dL

## 2022-04-20 LAB — FERRITIN: Ferritin: 495 ng/mL — ABNORMAL HIGH (ref 11–307)

## 2022-04-20 LAB — VITAMIN B12: Vitamin B-12: 276 pg/mL (ref 180–914)

## 2022-04-20 MED ORDER — SODIUM BICARBONATE 650 MG PO TABS
650.0000 mg | ORAL_TABLET | Freq: Three times a day (TID) | ORAL | Status: DC
  Administered 2022-04-20 – 2022-04-21 (×3): 650 mg via ORAL
  Filled 2022-04-20 (×4): qty 1

## 2022-04-20 MED ORDER — LACTULOSE 10 GM/15ML PO SOLN
20.0000 g | Freq: Once | ORAL | Status: AC
  Administered 2022-04-20: 20 g via ORAL
  Filled 2022-04-20: qty 30

## 2022-04-20 MED ORDER — CIPROFLOXACIN HCL 500 MG PO TABS
500.0000 mg | ORAL_TABLET | Freq: Every day | ORAL | Status: DC
  Administered 2022-04-20: 500 mg via ORAL
  Filled 2022-04-20: qty 1

## 2022-04-20 NOTE — Progress Notes (Signed)
   Date of Admission:  04/15/2022     ID: Gabriela [name] is a 88 y.o. female  Principal Problem:   Right hip pain Active Problems:   Type 2 diabetes mellitus with renal manifestations (HCC)   CAD (coronary artery disease)   Non-healing non-surgical wound   Thrombocytopenia (HCC)   Anemia of chronic disease   Acute kidney injury superimposed on chronic kidney disease (HCC)   MGUS (monoclonal gammopathy of unknown significance)   Acute right hip pain   Acute metabolic encephalopathy   Pt doing okay Some pain rt hip area Medications:   brinzolamide  1 drop Both Eyes TID   And   brimonidine  1 drop Both Eyes TID   famotidine  20 mg Oral Daily   ferrous sulfate  325 mg Oral Q breakfast   insulin aspart  0-15 Units Subcutaneous TID WC   sodium chloride flush  3 mL Intravenous Q12H   timolol  1 drop Both Eyes Daily    Objective: Vital signs in last 24 hours: Temp:  [98 F (36.7 C)-98.7 F (37.1 C)] 98 F (36.7 C) (09/06 0837) Pulse Rate:  [57-65] 59 (09/06 0837) Resp:  [15-18] 15 (09/06 0837) BP: (143-161)/(56-71) 151/61 (09/06 0837) SpO2:  [99 %-100 %] 100 % (09/06 0837)  O/e awake and alert, no distress Chest b/l air entry Hss1s2 Abd soft Rt thigh small wound Lab Results Recent Labs    04/19/22 0611 04/20/22 0554  WBC 8.9 9.1  HGB 7.3* 9.0*  HCT 23.3* 27.6*  NA 140 137  K 3.9 3.5  CL 112* 108  CO2 20* 20*  BUN 37* 33*  CREATININE 1.29* 1.22*     Microbiology: Pseudomonas wound culture    Assessment/Plan: Rt thigh -Chronic  lateral wound - present for 6 years Worsening of rt hip pain recently Concern for fistula to the bone- CT scan does not show  that. There is fluid collection at the iliopsoas bursa Pseudomonas in the wound culture. No surgical intervention or IR procedure is being considered Currently on cefepime- will change to cipro adjusted to crcl of < 30 (513m Qd for 6 weeks end date -05/27/22 While on PO antibiotic will need  weekly labs  cbc/cmp/ESR/CRP Discussed with her granddaughter     Anemia    DM- on insulin   HTn on metoprolol   CAD- S/p CABG  Mitral valve repair   CKD  Discussed the management with her granddaughter- explained all side effects of cipro  Will follow as OP 05/10/22 at 11.45

## 2022-04-20 NOTE — Progress Notes (Signed)
  Progress Note   [name] EUM:[id number] DOB: Sep 13, 1925 DOA: 04/15/2022     4 DOS: the patient was seen and examined on 04/20/2022   Brief hospital course: 88 year old female with history of type 2 diabetes mellitus with hyperlipidemia hypertension glaucoma arthritis.  She has a chronic right hip replacement with fluid collection and had a nonhealing wound anterior right thigh.  He came into the hospital with difficulty ambulating and right hip pain.  I sent a culture from her right thigh which grew out Pseudomonas.  I initially started aggressive antibiotics and change antibiotics to cefepime once Pseudomonas came back.  Patient was seen in consultation by orthopedic surgery who did not want to do a hip replacement at this time.  Dialysis spoke with interventional radiology and they did not want to drain the fluid collection since it is chronic.  Patient now starting to walk with physical therapy  Assessment and Plan:  * Right hip pain Right thigh wound with Pseudomonas infection. 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 Patient has been evaluated by orthopedics and IR, not a candidate for I&D. Currently on cefepime.  Followed by ID.  Planning to transition to oral Cipro for 4 to 6 weeks at time of discharge.   Acute metabolic encephalopathy Generalized weakness. Mental status improved, patient was initially evaluated by PT and OT, recommend nursing placement.  However, patient condition has much improved today, she was able to walk, family is hoping that patient can be discharged home with home care.  Acute kidney injury superimposed on chronic kidney disease 3A(HCC) Mild metabolic acidosis. Renal function has back to baseline, will start lower dose sodium bicarbonate.  Anemia of chronic disease Thrombocytopenia. MGUS (monoclonal gammopathy of unknown significance) Status post transfusion, hemoglobin is better.  We will check iron B12 level. Platelets level stable.  Type 2  diabetes mellitus with renal manifestations (HCC) Continue current regimen regimen, will change to home regimen at discharge.  CAD (coronary artery disease) Condition stable.        Subjective:  Patient doing much better today, she was able to walk with assist.  Right hip pain much improved.  Physical Exam: Vitals:   04/19/22 1640 04/19/22 1948 04/20/22 0216 04/20/22 0837  BP: (!) 154/62 (!) 143/58 (!) 149/71 (!) 151/61  Pulse: 65 63 (!) 57 (!) 59  Resp: '16 17 15 15  '$ Temp: 98.2 F (36.8 C) 98.4 F (36.9 C) 98.3 F (36.8 C) 98 F (36.7 C)  TempSrc:      SpO2: 100% 99% 100% 100%  Weight:      Height:       General exam: Appears calm and comfortable  Respiratory system: Clear to auscultation. Respiratory effort normal. Cardiovascular system: S1 & S2 heard, RRR. No JVD, murmurs, rubs, gallops or clicks. No pedal edema. Gastrointestinal system: Abdomen is nondistended, soft and nontender. No organomegaly or masses felt. Normal bowel sounds heard. Central nervous system: Alert and oriented. No focal neurological deficits. Extremities: Symmetric 5 x 5 power. Skin: No rashes, lesions or ulcers Psychiatry: Judgement and insight appear normal. Mood & affect appropriate.   Data Reviewed:  Lab results reviewed.  Family Communication: Daughter updated at the bedside.  Disposition: Status is: Inpatient Remains inpatient appropriate because: Severity of disease,  Planned Discharge Destination:  TBD    Time spent: 35 minutes  Author: Sharen Hones, MD 04/20/2022 3:19 PM  For on call review [email or web address].

## 2022-04-20 NOTE — Progress Notes (Signed)
Physical Therapy Treatment Patient Details Name: Gabriela [name] MRN: [id number] DOB: [birth year] Today's Date: 04/20/2022   History of Present Illness Pt is 27 YOF admitted for R hip pain, anemia, and thrombocytopenia. PMH includes: DM2, CKD4, glaucoma, HTN, CAD, THA (30 years ago), and nonhealing surgical wound on R thigh    PT Comments    Pt presents to PT in bed and agreeable to participate in therapy services. Pt was pleasant and motivated to participate during the session and put forth good effort throughout. Pt requires increased time and effort, and physical assistance for bed mobility and transfers, but is able to participate. Amb well, requiring no physical assistance or LOB, though continues to present w slow, effortful cadence. Would benefit from skilled PT at SNF to address above deficits in strength, functional mobility, activity tolerance, and balance to promote optimal return to PLOF.      Recommendations for follow up therapy are one component of a multi-disciplinary discharge planning process, led by the attending physician.  Recommendations may be updated based on patient status, additional functional criteria and insurance authorization.  Follow Up Recommendations  Skilled nursing-short term rehab (<3 hours/day) Can patient physically be transported by private vehicle: Yes   Assistance Recommended at Discharge Frequent or constant Supervision/Assistance  Patient can return home with the following A little help with walking and/or transfers;Assistance with cooking/housework;Assist for transportation;A little help with bathing/dressing/bathroom;Help with stairs or ramp for entrance   Equipment Recommendations  None recommended by PT    Recommendations for Other Services       Precautions / Restrictions Precautions Precautions: Fall Restrictions Weight Bearing Restrictions: No     Mobility  Bed Mobility Overal bed mobility: Needs Assistance Bed Mobility: Supine to  Sit     Supine to sit: Mod assist     General bed mobility comments: requires increased time and effort.     Transfers Overall transfer level: Needs assistance Equipment used: Rolling walker (2 wheels) Transfers: Sit to/from Stand Sit to Stand: Min assist                Ambulation/Gait Ambulation/Gait assistance: Min guard Gait Distance (Feet): 40 Feet Assistive device: Rolling walker (2 wheels) Gait Pattern/deviations: Step-through pattern, Decreased step length - left, Decreased stance time - right Gait velocity: decr     General Gait Details: gait slow and effortful, but steady   Stairs             Wheelchair Mobility    Modified Rankin (Stroke Patients Only)       Balance Overall balance assessment: Needs assistance Sitting-balance support: Feet supported, No upper extremity supported Sitting balance-Leahy Scale: Good     Standing balance support: During functional activity, Reliant on assistive device for balance, Bilateral upper extremity supported Standing balance-Leahy Scale: Fair                              Cognition Arousal/Alertness: Awake/alert Behavior During Therapy: WFL for tasks assessed/performed Overall Cognitive Status: Within Functional Limits for tasks assessed                                 General Comments: alert and oriented        Exercises      General Comments        Pertinent Vitals/Pain Pain Assessment Pain Assessment: 0-10 Pain Score: 8  Pain Descriptors /  Indicators: Sharp Pain Intervention(s): Limited activity within patient's tolerance, Monitored during session, RN gave pain meds during session, Repositioned    Home Living                          Prior Function            PT Goals (current goals can now be found in the care plan section) Acute Rehab PT Goals Patient Stated Goal: to stay independent PT Goal Formulation: With patient Time For Goal  Achievement: 04/30/22 Potential to Achieve Goals: Good Progress towards PT goals: Progressing toward goals    Frequency    Min 2X/week      PT Plan Current plan remains appropriate    Co-evaluation              AM-PAC PT "6 Clicks" Mobility   Outcome Measure  Help needed turning from your back to your side while in a flat bed without using bedrails?: A Little Help needed moving from lying on your back to sitting on the side of a flat bed without using bedrails?: A Little Help needed moving to and from a bed to a chair (including a wheelchair)?: A Little Help needed standing up from a chair using your arms (e.g., wheelchair or bedside chair)?: A Little Help needed to walk in hospital room?: A Little Help needed climbing 3-5 steps with a railing? : A Lot 6 Click Score: 17    End of Session Equipment Utilized During Treatment: Gait belt Activity Tolerance: Patient tolerated treatment well Patient left: in chair;with call bell/phone within reach;with chair alarm set;with family/visitor present;with nursing/sitter in room;with SCD's reapplied Nurse Communication: Mobility status PT Visit Diagnosis: Muscle weakness (generalized) (M62.81);Pain;Other abnormalities of gait and mobility (R26.89) Pain - Right/Left: Right Pain - part of body: Hip     Time: 4818-5631 PT Time Calculation (min) (ACUTE ONLY): 29 min  Charges:                        Glenice Laine MPH, SPT 04/20/22, 4:07 PM

## 2022-04-20 NOTE — TOC Progression Note (Signed)
Transition of Care [facility]) - Progression Note    Patient Details  Name: Gabriela [name] MRN: [id number] Date of Birth: 06-25-1926  Transition of Care [facility]) CM/SW [place], RN Phone Number: 04/20/2022, 3:20 PM  Clinical Narrative:     Met with the patient and her family in te room, spoke with [name] on the phone and reviewed the bed offers, [name] will review each facility and call me with a choice, I explained then I need to get the Ins approval  Expected Discharge Plan: [place] Barriers to Discharge: SNF Pending bed offer, Insurance Authorization  Expected Discharge Plan and Services Expected Discharge Plan: [place]                                               Social Determinants of Health (SDOH) Interventions    Readmission Risk Interventions     No data to display

## 2022-04-21 DIAGNOSIS — G9341 Metabolic encephalopathy: Secondary | ICD-10-CM | POA: Diagnosis not present

## 2022-04-21 DIAGNOSIS — M25551 Pain in right hip: Secondary | ICD-10-CM | POA: Diagnosis not present

## 2022-04-21 DIAGNOSIS — T148XXA Other injury of unspecified body region, initial encounter: Secondary | ICD-10-CM | POA: Diagnosis not present

## 2022-04-21 LAB — CBC
HCT: 26.8 % — ABNORMAL LOW (ref 36.0–46.0)
Hemoglobin: 8.7 g/dL — ABNORMAL LOW (ref 12.0–15.0)
MCH: 26.2 pg (ref 26.0–34.0)
MCHC: 32.5 g/dL (ref 30.0–36.0)
MCV: 80.7 fL (ref 80.0–100.0)
Platelets: 69 10*3/uL — ABNORMAL LOW (ref 150–400)
RBC: 3.32 MIL/uL — ABNORMAL LOW (ref 3.87–5.11)
RDW: 19 % — ABNORMAL HIGH (ref 11.5–15.5)
WBC: 8.3 10*3/uL (ref 4.0–10.5)
nRBC: 0 % (ref 0.0–0.2)

## 2022-04-21 LAB — BASIC METABOLIC PANEL
Anion gap: 9 (ref 5–15)
BUN: 32 mg/dL — ABNORMAL HIGH (ref 8–23)
CO2: 20 mmol/L — ABNORMAL LOW (ref 22–32)
Calcium: 8.2 mg/dL — ABNORMAL LOW (ref 8.9–10.3)
Chloride: 106 mmol/L (ref 98–111)
Creatinine, Ser: 1.22 mg/dL — ABNORMAL HIGH (ref 0.44–1.00)
GFR, Estimated: 41 mL/min — ABNORMAL LOW (ref >60)
Glucose, Bld: 94 mg/dL (ref 70–99)
Potassium: 4 mmol/L (ref 3.5–5.1)
Sodium: 135 mmol/L (ref 135–145)

## 2022-04-21 LAB — MAGNESIUM: Magnesium: 1.5 mg/dL — ABNORMAL LOW (ref 1.7–2.4)

## 2022-04-21 LAB — GLUCOSE, CAPILLARY: Glucose-Capillary: 88 mg/dL (ref 70–99)

## 2022-04-21 MED ORDER — SODIUM BICARBONATE 650 MG PO TABS: 650.0000 mg | ORAL_TABLET | Freq: Three times a day (TID) | ORAL | 0 refills | Status: AC

## 2022-04-21 MED ORDER — MAGNESIUM SULFATE 4 GM/100ML IV SOLN
4.0000 g | Freq: Once | INTRAVENOUS | Status: AC
  Administered 2022-04-21: 4 g via INTRAVENOUS
  Filled 2022-04-21: qty 100

## 2022-04-21 MED ORDER — CYANOCOBALAMIN 1000 MCG PO TABS: 1000.0000 ug | ORAL_TABLET | Freq: Every day | ORAL | 0 refills | Status: DC

## 2022-04-21 MED ORDER — VITAMIN B-12 1000 MCG PO TABS
1000.0000 ug | ORAL_TABLET | Freq: Every day | ORAL | Status: DC
  Administered 2022-04-21: 1000 ug via ORAL
  Filled 2022-04-21: qty 1

## 2022-04-21 MED ORDER — CIPROFLOXACIN HCL 500 MG PO TABS: 500.0000 mg | ORAL_TABLET | Freq: Every day | ORAL | 0 refills | Status: DC

## 2022-04-21 NOTE — Inpatient Diabetes Management (Signed)
Inpatient Diabetes Program Recommendations  AACE/ADA: New Consensus Statement on Inpatient Glycemic Control   Target Ranges:  Prepandial:   less than 140 mg/dL      Peak postprandial:   less than 180 mg/dL (1-2 hours)      Critically ill patients:  140 - 180 mg/dL    Latest Reference Range & Units 04/20/22 08:20 04/20/22 11:44 04/20/22 16:43 04/20/22 21:20 04/20/22 21:36 04/20/22 21:51 04/21/22 07:48  Glucose-Capillary 70 - 99 mg/dL 89 120 (H) 155 (H)  Novolog 3 units 63 (L) 68 (L) 80 88   Review of Glycemic Control  Diabetes history: DM2 Outpatient Diabetes medications: Levemir 10 units QHS Current orders for Inpatient glycemic control: Novolog 0-15 units TID with meals  Inpatient Diabetes Program Recommendations:    Insulin: Please decrease Novolog correction to Novolog 0-9 units TID with meals.  Thanks, Barnie Alderman, RN, MSN, [place] Diabetes Coordinator Inpatient Diabetes Program [phone] (Team Pager from 8am to [place])

## 2022-04-21 NOTE — TOC Progression Note (Addendum)
Transition of Care [facility]) - Progression Note    Patient Details  Name: Gabriela [name] MRN: [id number] Date of Birth: [birth year]  Transition of Care [facility]) CM/SW [place], RN Phone Number: 04/21/2022, 9:41 AM  Clinical Narrative:    Per Doctor, the patient's daughter wants the patient to go home with [facility] and the family will provide Assistance as needed I called and spoke to [facility], she reviewed the facilities and was not happy with them, she wants her to go home, she saw her with PT and the patient did well, The patient has a lift chair, The patient's son will stay with her 24/7 and grand daughter is next door, the patient has a rollator but needs a rolling walker, [employer] will deliver to the bedside, she wants home health set up, [name] will transport her home.   Sent [place] referral to [facility] and awaiting to hear back from [place] confirm [facility] unable to accept, Sent [facility]  Expected Discharge Plan: Skilled Nursing Facility Barriers to Discharge: SNF Pending bed offer, Insurance Authorization  Expected Discharge Plan and Services Expected Discharge Plan: [place]         Expected Discharge Date: 04/21/22                                     Social Determinants of Health (SDOH) Interventions    Readmission Risk Interventions     No data to display

## 2022-04-21 NOTE — Discharge Summary (Signed)
Physician Discharge Summary   [name] MRN: [id number] DOB: 1926-03-27  Admit date:     04/15/2022  Discharge date: 04/21/22  Discharge Physician: Sharen Hones   PCP: Jerrol Banana., MD   Recommendations at discharge:   Follow-up with PCP in 1 week.  Weekly labs including CBC, CMP, ESR and CRP. Follow-up with Dr. Levester Fresh in 2 weeks. Change dressing as ordered separately. Discontinue all insulins, take a snack before sleep.  Follow-up with PCP to restart insulin if glucose running high again. Follow the results of homocystine level to decide if B12 treatment is needed.  Discharge Diagnoses: Principal Problem:   Right hip pain Active Problems:   Non-healing non-surgical wound   Acute metabolic encephalopathy   Anemia of chronic disease   Acute kidney injury superimposed on chronic kidney disease (HCC)   Type 2 diabetes mellitus with renal manifestations (HCC)   Thrombocytopenia (HCC)   CAD (coronary artery disease)   MGUS (monoclonal gammopathy of unknown significance)   Acute right hip pain   Hypomagnesemia  Resolved Problems:   * No resolved hospital problems. *  Hospital Course: 88 year old female with history of type 2 diabetes mellitus with hyperlipidemia hypertension glaucoma arthritis.  She has a chronic right hip replacement with fluid collection and had a nonhealing wound anterior right thigh.  He came into the hospital with difficulty ambulating and right hip pain.  I sent a culture from her right thigh which grew out Pseudomonas.  I initially started aggressive antibiotics and change antibiotics to cefepime once Pseudomonas came back.  Patient was seen in consultation by orthopedic surgery who did not want to do a hip replacement at this time.  Dialysis spoke with interventional radiology and they did not want to drain the fluid collection since it is chronic.  Patient now starting to walk with physical therapy  Assessment and Plan: Right hip  pain Right thigh wound with Pseudomonas infection. 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 Patient has been evaluated by orthopedics and IR, not a candidate for I&D. Started on cefepime.  Followed by ID.  Transition to oral Cipro for 6 weeks at time of discharge. Patient be followed by PCP and ID in the near future.   Acute metabolic encephalopathy Generalized weakness. Mental status improved, patient was initially evaluated by PT and OT, recommend nursing placement.  However, patient has refused to go to nursing home.  Had a long discussion with granddaughter, patient has planned to help at home, granddaughter lives next door and works at home.  She also want patient to go home instead of nursing home.  At this point, patient will be discharged home with PT/OT/RN/aids.   Acute kidney injury superimposed on chronic kidney disease 3A(HCC) Mild metabolic acidosis. Hypomagnesemia. Renal function has back to baseline, will continue lower dose sodium bicarbonate. 4 g of magnesium sulfate for magnesium level 1.5.   Anemia of chronic disease Thrombocytopenia. MGUS (monoclonal gammopathy of unknown significance) Status post transfusion, hemoglobin is better.  B12 level 276, homocystine level sent out, please follow-up with results.  Patient be prescribed 2 weeks of B12.   Type 2 diabetes mellitus with renal manifestations (HCC) Hypoglycemia. Patient currently only on sliding scale insulin, she had episode of hypoglycemia, overall glucose not elevated significantly.  I will DC all insulins, patient also advised to take a snack before sleep.  Follow-up with PCP and continue monitor glucose.   CAD (coronary artery disease) Condition stable.  Consultants: ID, Orthopedics Procedures performed: None  Disposition: Home health Diet recommendation:  Discharge Diet Orders (From admission, onward)     Start     Ordered   04/21/22 0000  Diet - low sodium heart healthy        04/21/22 0925            Cardiac diet DISCHARGE MEDICATION: Allergies as of 04/21/2022       Reactions   Sulfa Antibiotics Rash        Medication List     STOP taking these medications    benzonatate 100 MG capsule Commonly known as: TESSALON   hydrochlorothiazide 25 MG tablet Commonly known as: HYDRODIURIL   Levemir FlexTouch 100 UNIT/ML FlexPen Generic drug: insulin detemir   ondansetron 4 MG disintegrating tablet Commonly known as: Zofran ODT       TAKE these medications    Accu-Chek Aviva Plus test strip Generic drug: glucose blood USE AS INSTRUCTED DX E11.9   Accu-Chek Aviva Plus w/Device Kit 1 each by Does not apply route daily. Dx E11.9   Accu-Chek FastClix Lancets Misc To check blood sugars once a day. DX E11.9   albuterol 108 (90 Base) MCG/ACT inhaler Commonly known as: VENTOLIN HFA INHALE 1-2 PUFFS INTO THE LUNGS EVERY 4 HOURS AS NEEDED FOR WHEEZING OR SHORTNESS OF BREATH.   aspirin EC 81 MG tablet Take 81 mg by mouth daily.   B-D UF III MINI PEN NEEDLES 31G X 5 MM Misc Generic drug: Insulin Pen Needle USE WITH INSULIN DOSES   ciprofloxacin 500 MG tablet Commonly known as: CIPRO Take 1 tablet (500 mg total) by mouth daily at 6 PM.   cyanocobalamin 1000 MCG tablet Take 1 tablet (1,000 mcg total) by mouth daily for 14 days.   ferrous sulfate 325 (65 FE) MG tablet Take 1 tablet (325 mg total) by mouth daily with breakfast. TAKE 1 TABLET BY MOUTH DAILY.   furosemide 20 MG tablet Commonly known as: LASIX TAKE 1 TABLET BY MOUTH EVERY DAY   metoprolol tartrate 25 MG tablet Commonly known as: LOPRESSOR TAKE 1/2 TABLET BY MOUTH EVERY DAY   Simbrinza 1-0.2 % Susp Generic drug: Brinzolamide-Brimonidine Apply 1 drop to eye 2 (two) times daily.   timolol 0.5 % ophthalmic solution Commonly known as: TIMOPTIC Apply to eye.               Discharge Care Instructions  (From admission, onward)           Start     Ordered   04/21/22 0000   Discharge wound care:       Comments: Cleanse with NS, pat dry. Dress with betadine damped gauze dressing, top with dry dressing, secure with silicone foam, change daily.   04/21/22 4627            Follow-up Information     Jerrol Banana., MD Follow up in 1 week(s).   Specialty: Family Medicine Contact information: [address] [place] [address] [phone]         Tsosie Billing, MD Follow up in 2 week(s).   Specialty: Infectious Diseases Contact information: [place] [place] [address] [phone]                Discharge Exam: Danley Danker Weights   04/16/22 1002 04/17/22 1627  Weight: 53.3 kg 54.9 kg   General exam: Appears calm and comfortable  Respiratory system: Clear to auscultation. Respiratory effort normal. Cardiovascular system: S1 & S2  heard, RRR. No JVD, murmurs, rubs, gallops or clicks. No pedal edema. Gastrointestinal system: Abdomen is nondistended, soft and nontender. No organomegaly or masses felt. Normal bowel sounds heard. Central nervous system: Alert and oriented. No focal neurological deficits. Extremities: Symmetric 5 x 5 power. Skin: No rashes, lesions or ulcers Psychiatry: Judgement and insight appear normal. Mood & affect appropriate.    Condition at discharge: good  The results of significant diagnostics from this hospitalization (including imaging, microbiology, ancillary and laboratory) are listed below for reference.   Imaging Studies: CT Hip Right Wo Contrast  Result Date: 04/15/2022 CLINICAL DATA:  Hip trauma, fracture suspected. EXAM: CT OF THE RIGHT HIP WITHOUT CONTRAST TECHNIQUE: Multidetector CT imaging of the right hip was performed according to the standard protocol. Multiplanar CT image reconstructions were also generated. RADIATION DOSE REDUCTION: This exam was performed according to the departmental dose-optimization program which includes automated exposure control, adjustment of  the mA and/or kV according to patient size and/or use of iterative reconstruction technique. COMPARISON:  Radiographs same date and 05/19/2018. Pelvic CT 10/18/2020. Right femur MRI 10/19/2017. FINDINGS: Bones/Joint/Cartilage Status post right total hip arthroplasty with a screw fixed acetabular component. Osseous evaluation is limited by beam hardening artifact and the severe osteopenia. There is chronic osteolysis surrounding the acetabular cup with a large chronic defect in the medial wall of the acetabulum. Chronic superior migration of the acetabular cup appears similar to previous CT, and no definite acute acetabular fracture is demonstrated. No definite femur fracture or loosening of the femoral stem identified allowing for the limitations above. There is no dislocation or evidence of polyethylene wear. Ligaments Suboptimally assessed by CT. Muscles and Tendons There is a chronic fluid collection anterior to the right hip which is likely partially contained in the iliopsoas bursa and measures up to 5.0 x 3.0 cm on image 57/5. This is partially obscured by beam hardening artifact, although appears similar to the recent pelvic CT. This tracks slightly more inferiorly into the anterior aspect of the right thigh. Diffuse muscular atrophy, especially of the right iliacus muscle. No definite acute intramuscular fluid collection. Soft tissues Chronic fluid collection anterior to the right hip as noted above. No definite acute fluid collection, foreign body or soft tissue emphysema. Diffuse iliofemoral atherosclerosis. IMPRESSION: 1. 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 2. No definite acute fracture or dislocation. Definitive exclusion of a fracture is difficult due to the patient's extreme osteopenia and beam hardening artifact from the hardware. Radiographic follow up recommended if the patient remains symptomatic. 3. Chronic fluid collection  anterior to the right hip appears partially contained within the iliopsoas bursa and may relate to bursitis and/or particle disease. Follow this demonstrates possibly more inferior extension into the anterior thigh common no acute fluid collections are suggested. Electronically Signed   By: Richardean Sale M.D.   On: 04/15/2022 10:57   DG Hip Unilat W or Wo Pelvis 2-3 Views Right  Result Date: 04/15/2022 CLINICAL DATA:  Hip pain EXAM: DG HIP (WITH OR WITHOUT PELVIS) 2V RIGHT COMPARISON:  Hip radiograph dated May 19, 2018 FINDINGS: Prior right total hip arthroplasty. Unchanged lucency adjacent to the acetabular cup and at the lesser trochanter. No definite evidence of displaced fracture. No evidence of dislocation. Advanced degenerative changes of the left hip. Vascular calcifications. Severe diffuse demineralization. IMPRESSION: No definite evidence of displaced fracture. Severe diffuse demineralization limits evaluation for nondisplaced fracture, if there is continued clinical concern recommend further evaluation with  cross-sectional imaging. Electronically Signed   By: Yetta Glassman M.D.   On: 04/15/2022 09:28    Microbiology: Results for orders placed or performed during the hospital encounter of 04/15/22  Aerobic/Anaerobic Culture w Gram Stain (surgical/deep wound)     Status: None (Preliminary result)   Collection Time: 04/16/22  6:47 PM   Specimen: Wound  Result Value Ref Range Status   Specimen Description   Final    WOUND Performed at [facility], [address], [place], [place] [address]    Special Requests   Final    [facility] Performed at [facility], [address] [place], [place] [address]    Gram Stain   Final    RARE WBC PRESENT,BOTH PMN AND MONONUCLEAR NO ORGANISMS SEEN Performed at [facility], [place] [address] [place], [place] [address]    Culture   Final    FEW PSEUDOMONAS AERUGINOSA NO ANAEROBES ISOLATED; CULTURE IN PROGRESS FOR 5 DAYS     Report Status PENDING  Incomplete   Organism ID, Bacteria PSEUDOMONAS AERUGINOSA  Final      Susceptibility   Pseudomonas aeruginosa - MIC*    CEFTAZIDIME 2 SENSITIVE Sensitive     CIPROFLOXACIN <=0.25 SENSITIVE Sensitive     GENTAMICIN <=1 SENSITIVE Sensitive     IMIPENEM 2 SENSITIVE Sensitive     PIP/TAZO 8 SENSITIVE Sensitive     CEFEPIME 2 SENSITIVE Sensitive     * FEW PSEUDOMONAS AERUGINOSA  C Difficile Quick Screen w PCR reflex     Status: None   Collection Time: 04/16/22  8:05 PM   Specimen: STOOL  Result Value Ref Range Status   C Diff antigen NEGATIVE NEGATIVE Final   C Diff toxin NEGATIVE NEGATIVE Final   C Diff interpretation No C. difficile detected.  Final    Comment: Performed at [facility], [address] [place], [place] [address]  MRSA Next Gen by PCR, Nasal     Status: None   Collection Time: 04/18/22 10:38 AM   Specimen: Nasal Mucosa; Nasal Swab  Result Value Ref Range Status   MRSA by PCR Next Gen NOT DETECTED NOT DETECTED Final    Comment: (NOTE) The GeneXpert MRSA Assay (FDA approved for NASAL specimens only), is one component of a comprehensive MRSA colonization surveillance program. It is not intended to diagnose MRSA infection nor to guide or monitor treatment for MRSA infections. Test performance is not FDA approved in patients less than 51 years old. Performed at [facility], [address] [place], [place] [address]     Labs: CBC: Recent Labs  Lab 04/15/22 [phone] 04/16/22 0529 04/17/22 0630 04/18/22 0426 04/19/22 0611 04/20/22 0554 04/21/22 0554  WBC 7.2   < > 9.8 10.0 8.9 9.1 8.3  NEUTROABS 3.7  --   --   --   --   --   --   HGB 7.2*   < > 8.4* 7.9* 7.3* 9.0* 8.7*  HCT 23.1*   < > 26.2* 24.6* 23.3* 27.6* 26.8*  MCV 77.8*   < > 79.2* 78.8* 78.7* 79.5* 80.7  PLT 86*   < > 72* 74* 73* 73* 69*   < > = values in this interval not displayed.   Basic Metabolic Panel: Recent Labs  Lab 04/17/22 0630 04/18/22 0426  04/19/22 0611 04/20/22 0554 04/21/22 0554  NA 135 140 140 137 135  K 3.8 3.7 3.9 3.5 4.0  CL 106 110 112* 108 106  CO2 21*  23 20* 20* 20*  GLUCOSE 95 104* 116* 94 94  BUN 39* 37* 37* 33* 32*  CREATININE 1.43* 1.42* 1.29* 1.22* 1.22*  CALCIUM 8.3* 8.3* 8.2* 8.5* 8.2*  MG  --   --   --   --  1.5*   Liver Function Tests: Recent Labs  Lab 04/17/22 0630  AST 17  ALT 8  ALKPHOS 82  BILITOT 1.0  PROT 8.2*  ALBUMIN 3.1*   CBG: Recent Labs  Lab 04/20/22 1643 04/20/22 2120 04/20/22 2136 04/20/22 2151 04/21/22 0748  GLUCAP 155* 63* 68* 80 88    Discharge time spent: greater than 30 minutes.  Signed: Sharen Hones, MD [facility] 04/21/2022

## 2022-04-21 NOTE — Progress Notes (Signed)
D/c orders received, walker was delivered to bedside, grand-daughter is her to transport pt home.  AVS to be printed and explained to pt and family.  IV taken out, pt to be transported to medical mall entrance to d/c home.

## 2022-04-21 NOTE — Progress Notes (Signed)
Hypoglycemic Event  CBG: 63  Treatment: 4 oz orange juice given, re-checked CBG 15 mins later CBG was 68 mg/dL. Gave the patient another 4 oz OJ, re-checked another 15 mins later and CBG was 80 mg/dL.  Symptoms: None.  Follow-up CBG: Time: 2151 CBG Result:80  Possible Reasons for Event: Inadequate meal intake  Comments/MD notified: Per Standing orders for hypoglycemic event      Balinda Quails

## 2022-04-22 LAB — AEROBIC/ANAEROBIC CULTURE W GRAM STAIN (SURGICAL/DEEP WOUND)

## 2022-04-22 LAB — HOMOCYSTEINE: Homocysteine: 32.5 umol/L — ABNORMAL HIGH (ref 0.0–21.3)

## 2022-04-25 ENCOUNTER — Telehealth: Payer: Self-pay

## 2022-04-25 NOTE — Telephone Encounter (Signed)
Transition Care Management Follow-up Telephone Call Date of discharge and from where: TCM DC [facility] 04-21-22 Dx: right hip pain  How have you been since you were released from the hospital? Getting stronger and better  Any questions or concerns? No  Items Reviewed: Did the pt receive and understand the discharge instructions provided? Yes  Medications obtained and verified? Yes  Other? No  Any new allergies since your discharge? No  Dietary orders reviewed? Yes Do you have support at home? Yes   Home Care and Equipment/Supplies: Were home health services ordered? yes If so, what is the name of the agency? [facility]   Has the agency set up a time to come to the patient's home? yes Were any new equipment or medical supplies ordered?  No What is the name of the medical supply agency? na Were you able to get the supplies/equipment? not applicable Do you have any questions related to the use of the equipment or supplies? No  Functional Questionnaire: (I = Independent and D = Dependent) ADLs: D  Bathing/Dressing- D  Meal Prep- D  Eating- I  Maintaining continence- I  Transferring/Ambulation- I- WALKER  Managing Meds- D  Follow up appointments reviewed:  PCP Hospital f/u appt confirmed? Yes  Scheduled to see Mardene Speak PA-C on 04-28-22 @ 2pm.- Televisit- daughter is not able to bring pt in- video visit would be best  [facility] f/u appt confirmed? no Are transportation arrangements needed? No  If their condition worsens, is the pt aware to call PCP or go to the Emergency Dept.? Yes Was the patient provided with contact information for the PCP's office or ED? Yes Was to pt encouraged to call back with questions or concerns? Yes

## 2022-04-27 ENCOUNTER — Telehealth: Payer: Self-pay | Admitting: Family Medicine

## 2022-04-27 DIAGNOSIS — E8721 Acute metabolic acidosis: Secondary | ICD-10-CM | POA: Diagnosis not present

## 2022-04-27 DIAGNOSIS — Z9181 History of falling: Secondary | ICD-10-CM | POA: Diagnosis not present

## 2022-04-27 DIAGNOSIS — D696 Thrombocytopenia, unspecified: Secondary | ICD-10-CM | POA: Diagnosis not present

## 2022-04-27 DIAGNOSIS — N184 Chronic kidney disease, stage 4 (severe): Secondary | ICD-10-CM | POA: Diagnosis not present

## 2022-04-27 DIAGNOSIS — T8451XA Infection and inflammatory reaction due to internal right hip prosthesis, initial encounter: Secondary | ICD-10-CM | POA: Diagnosis not present

## 2022-04-27 DIAGNOSIS — I2510 Atherosclerotic heart disease of native coronary artery without angina pectoris: Secondary | ICD-10-CM | POA: Diagnosis not present

## 2022-04-27 DIAGNOSIS — D631 Anemia in chronic kidney disease: Secondary | ICD-10-CM | POA: Diagnosis not present

## 2022-04-27 DIAGNOSIS — M1990 Unspecified osteoarthritis, unspecified site: Secondary | ICD-10-CM | POA: Diagnosis not present

## 2022-04-27 DIAGNOSIS — G9341 Metabolic encephalopathy: Secondary | ICD-10-CM | POA: Diagnosis not present

## 2022-04-27 DIAGNOSIS — E785 Hyperlipidemia, unspecified: Secondary | ICD-10-CM | POA: Diagnosis not present

## 2022-04-27 DIAGNOSIS — E1122 Type 2 diabetes mellitus with diabetic chronic kidney disease: Secondary | ICD-10-CM | POA: Diagnosis not present

## 2022-04-27 DIAGNOSIS — E1169 Type 2 diabetes mellitus with other specified complication: Secondary | ICD-10-CM | POA: Diagnosis not present

## 2022-04-27 DIAGNOSIS — B965 Pseudomonas (aeruginosa) (mallei) (pseudomallei) as the cause of diseases classified elsewhere: Secondary | ICD-10-CM | POA: Diagnosis not present

## 2022-04-27 DIAGNOSIS — N179 Acute kidney failure, unspecified: Secondary | ICD-10-CM | POA: Diagnosis not present

## 2022-04-27 DIAGNOSIS — Z792 Long term (current) use of antibiotics: Secondary | ICD-10-CM | POA: Diagnosis not present

## 2022-04-27 DIAGNOSIS — D472 Monoclonal gammopathy: Secondary | ICD-10-CM | POA: Diagnosis not present

## 2022-04-27 DIAGNOSIS — G5603 Carpal tunnel syndrome, bilateral upper limbs: Secondary | ICD-10-CM | POA: Diagnosis not present

## 2022-04-27 DIAGNOSIS — I10 Essential (primary) hypertension: Secondary | ICD-10-CM | POA: Diagnosis not present

## 2022-04-27 DIAGNOSIS — Z951 Presence of aortocoronary bypass graft: Secondary | ICD-10-CM | POA: Diagnosis not present

## 2022-04-27 DIAGNOSIS — H409 Unspecified glaucoma: Secondary | ICD-10-CM | POA: Diagnosis not present

## 2022-04-27 NOTE — Telephone Encounter (Signed)
Home Health Verbal Orders - Caller/Agency: Tabitha with [place] Number: [phone] Requesting OT/PT:Skilled Nursing Frequency: PT & OT Evaluation and for Skilled Nursing 1 wk 4, 1 wk 7 and 1 prn

## 2022-04-27 NOTE — Telephone Encounter (Signed)
Please advise 

## 2022-04-28 ENCOUNTER — Telehealth (INDEPENDENT_AMBULATORY_CARE_PROVIDER_SITE_OTHER): Payer: Medicare Other | Admitting: Physician Assistant

## 2022-04-28 ENCOUNTER — Telehealth: Payer: Self-pay

## 2022-04-28 ENCOUNTER — Encounter: Payer: Self-pay | Admitting: Physician Assistant

## 2022-04-28 ENCOUNTER — Telehealth: Payer: Self-pay | Admitting: Family Medicine

## 2022-04-28 DIAGNOSIS — Z09 Encounter for follow-up examination after completed treatment for conditions other than malignant neoplasm: Secondary | ICD-10-CM

## 2022-04-28 NOTE — Telephone Encounter (Signed)
Pt came into the office and I spoke with her.  Stated the connection was cut off on VV and when CMA called back she was told she needed to bring pt in.  Pt's granddaughter states she isn't about to get her grandmother int the car alone at this time and needs help.  That is why the hospital made it a VV.  Pt granddaughter is concerned about getting the patient's lab work done and per pt was told by CMA labs and PT orders will be cxed.   I assured the granddaughter that the OT/PT order has been oked per Dr. Rosanna Randy.   She requested Dr. Rosanna Randy put in lab orders for her to bring grandmother next week when she is able to get her cousin to help get her out of the car.

## 2022-04-28 NOTE — Progress Notes (Unsigned)
I,Sulibeya S Dimas,acting as a Education administrator for [employer], PA-C.,have documented all relevant documentation on the behalf of Mardene Speak, PA-C,as directed by  [employer], PA-C while in the presence of [employer], PA-C.   MyChart Video Visit    Virtual Visit via Video Note   This visit type was conducted due to national recommendations for restrictions regarding the COVID-19 Pandemic (e.g. social distancing) in an effort to limit this patient's exposure and mitigate transmission in our community. This patient is at least at moderate risk for complications without adequate follow up. This format is felt to be most appropriate for this patient at this time. Physical exam was limited by quality of the video and audio technology used for the visit.   Patient location: home Provider location: [facility]  I discussed the limitations of evaluation and management by telemedicine and the availability of in person appointments. The patient expressed understanding and agreed to proceed.  [name]   DOB: [birth year]   88 y.o. Female  MRN: [id number] Visit Date: 04/28/2022  Today's healthcare provider: Mardene Speak, PA-C   Chief Complaint  Patient presents with   Hospitalization Follow-up   Subjective    HPI  Follow up Hospitalization  Patient was admitted to [facility] on 04/15/22 and discharged on 04/21/22. She was treated for hip pain. Type 2 diabetes Treatment for this included She was treated for right hip pain. Type 2 diabetes Treatment for this included start Cipro for 6 weeks.  DC all insulin, and take a snack before bed due to hypoglycemia. Telephone follow up was done on 04/25/22. She reports excellent compliance with treatment. She reports this condition is improved. Patient reports fbs are ranging in 120s and after eating 140-170s. ----------------------------------------------------------------------------------------  Medications: Outpatient Medications Prior to Visit   Medication Sig   Accu-Chek FastClix Lancets MISC To check blood sugars once a day. DX E11.9   albuterol (VENTOLIN HFA) 108 (90 Base) MCG/ACT inhaler INHALE 1-2 PUFFS INTO THE LUNGS EVERY 4 HOURS AS NEEDED FOR WHEEZING OR SHORTNESS OF BREATH.   aspirin EC 81 MG tablet Take 81 mg by mouth daily.   Blood Glucose Monitoring Suppl (ACCU-CHEK AVIVA PLUS) w/Device KIT 1 each by Does not apply route daily. Dx E11.9   ciprofloxacin (CIPRO) 500 MG tablet Take 1 tablet (500 mg total) by mouth daily at 6 PM.   cyanocobalamin 1000 MCG tablet Take 1 tablet (1,000 mcg total) by mouth daily for 14 days.   ferrous sulfate 325 (65 FE) MG tablet Take 1 tablet (325 mg total) by mouth daily with breakfast. TAKE 1 TABLET BY MOUTH DAILY.   furosemide (LASIX) 20 MG tablet TAKE 1 TABLET BY MOUTH EVERY DAY   glucose blood (ACCU-CHEK AVIVA PLUS) test strip USE AS INSTRUCTED DX E11.9   metoprolol tartrate (LOPRESSOR) 25 MG tablet TAKE 1/2 TABLET BY MOUTH EVERY DAY   SIMBRINZA 1-0.2 % SUSP Apply 1 drop to eye 2 (two) times daily.   sodium bicarbonate 650 MG tablet Take 1 tablet (650 mg total) by mouth 3 (three) times daily for 14 days.   timolol (TIMOPTIC) 0.5 % ophthalmic solution Apply to eye.   B-D UF III MINI PEN NEEDLES 31G X 5 MM MISC USE WITH INSULIN DOSES (Patient not taking: Reported on 04/28/2022)   No facility-administered medications prior to visit.    Review of Systems  Constitutional:  Negative for appetite change and fatigue.  Respiratory:  Negative for chest tightness, shortness of breath and wheezing.  Cardiovascular:  Negative for chest pain and leg swelling.  Gastrointestinal:  Negative for abdominal pain, constipation, diarrhea, nausea and vomiting.    Last CBC Lab Results  Component Value Date   WBC 8.3 04/21/2022   HGB 8.7 (L) 04/21/2022   HCT 26.8 (L) 04/21/2022   MCV 80.7 04/21/2022   MCH 26.2 04/21/2022   RDW 19.0 (H) 04/21/2022   PLT 69 (L) 19/62/2297   Last metabolic panel Lab  Results  Component Value Date   GLUCOSE 94 04/21/2022   NA 135 04/21/2022   K 4.0 04/21/2022   CL 106 04/21/2022   CO2 20 (L) 04/21/2022   BUN 32 (H) 04/21/2022   CREATININE 1.22 (H) 04/21/2022   GFRNONAA 41 (L) 04/21/2022   CALCIUM 8.2 (L) 04/21/2022   PHOS 4.1 07/28/2020   PROT 8.2 (H) 04/17/2022   ALBUMIN 3.1 (L) 04/17/2022   LABGLOB 4.4 (H) 04/17/2022   AGRATIO 0.7 04/17/2022   BILITOT 1.0 04/17/2022   ALKPHOS 82 04/17/2022   AST 17 04/17/2022   ALT 8 04/17/2022   ANIONGAP 9 04/21/2022   Last lipids Lab Results  Component Value Date   CHOL 70 10/20/2020   HDL 15 (L) 10/20/2020   LDLCALC 34 10/20/2020   TRIG 106 10/20/2020   CHOLHDL 4.7 10/20/2020   Last hemoglobin A1c Lab Results  Component Value Date   HGBA1C 6.2 (H) 12/27/2021   Last thyroid functions Lab Results  Component Value Date   TSH 4.070 10/28/2020   Last vitamin D Lab Results  Component Value Date   VD25OH 7.0 (L) 10/28/2020   Last vitamin B12 and Folate Lab Results  Component Value Date   VITAMINB12 276 04/20/2022   FOLATE 12.1 10/20/2020       Objective    There were no vitals taken for this visit.  BP Readings from Last 3 Encounters:  04/21/22 (!) 132/58  03/17/22 (!) 115/56  12/27/21 120/61   Wt Readings from Last 3 Encounters:  04/17/22 121 lb 1.6 oz (54.9 kg)  12/27/21 117 lb 8 oz (53.3 kg)  04/04/21 126 lb (57.2 kg)       Physical Exam Constitutional:      General: She is not in acute distress.    Appearance: Normal appearance.  HENT:     Head: Normocephalic.  Pulmonary:     Effort: Pulmonary effort is normal. No respiratory distress.  Neurological:     Mental Status: She is alert and oriented to person, place, and time. Mental status is at baseline.        Assessment & Plan    Visit lasted 5 minutes. 2.5 minutes  of the visit was spent to adjust a visual and audial aspects of the video visit by pt's granddaughter  5 minutes into the visit, pt and her g/d   d/c this visit I waited for a few more minutes before logging out     I discussed the assessment and treatment plan with the patient. The patient was provided an opportunity to ask questions and all were answered. The patient agreed with the plan and demonstrated an understanding of the instructions.   The patient was advised to call back or seek an in-person evaluation if the symptoms worsen or if the condition fails to improve as anticipated.  I provided 5 minutes of non-face-to-face time during this encounter.   The entirety of the information documented in the History of Present Illness, Review of Systems and Physical Exam were personally obtained by me.  Portions of this information were initially documented by the CMA and reviewed by me for thoroughness and accuracy.  Portions of this note were created using dictation software and may contain typographical errors.    Mardene Speak, PA-C [facility] [phone] (phone) [phone] (fax)  [place]

## 2022-04-28 NOTE — Telephone Encounter (Signed)
Verbal okay given.  

## 2022-04-28 NOTE — Telephone Encounter (Signed)
April 28, 2022    04/28/22  3:38 PM Note Per Letitia Libra Oswalt patient needs to be seen in office for hospital follow up. Spoke with grand daughter who got upset at recommendation of coming into the office. She reports she can not bring her in this week due to grandmother not being able to get up and walk. She reports its just her and can not physically move her grandmother. And that she has to go out of town over the weekend. She demanded to talk to Dr. Rosanna Randy or [place]. Or she will "back up" in here.

## 2022-04-28 NOTE — Telephone Encounter (Signed)
Per Gabriela [name] patient needs to be seen in office for hospital follow up. Spoke with grand daughter who got upset at recommendation of coming into the office. She reports she can not bring her in this week due to grandmother not being able to get up and walk. She reports its just her and can not physically move her grandmother. And that she has to go out of town over the weekend. She demanded to talk to Dr. Rosanna [name] or [name] Or she will "back up" in here.

## 2022-04-28 NOTE — Telephone Encounter (Signed)
Copied from [place] [id number]. Topic: Quick Communication - Home Health Verbal Orders >> Apr 28, 2022  3:23 PM Ja-Kwan M wrote: Caller/Agency: Tabitha with [facility] Number: [phone] Requesting OT/PT/Skilled Nursing/Social Work/Speech Therapy: skilled nursing Frequency:  1 wk 4, 1 wk 7 and 1 prn

## 2022-04-28 NOTE — Telephone Encounter (Signed)
ok 

## 2022-04-28 NOTE — Telephone Encounter (Unsigned)
Copied from [place] [id number]. Topic: Appointment Scheduling - Scheduling Inquiry for Clinic >> Apr 28, 2022  2:10 PM [name] wrote: Gabriela [name] states she is physically unable to bring pt into the office for a hfu  Pt states she will not schedule a hfu for pt until if it can not be done via mychart >> Apr 28, 2022  2:26 PM [name] wrote:  *continue* Please assist further

## 2022-04-29 DIAGNOSIS — Z951 Presence of aortocoronary bypass graft: Secondary | ICD-10-CM | POA: Diagnosis not present

## 2022-04-29 DIAGNOSIS — E785 Hyperlipidemia, unspecified: Secondary | ICD-10-CM | POA: Diagnosis not present

## 2022-04-29 DIAGNOSIS — D472 Monoclonal gammopathy: Secondary | ICD-10-CM | POA: Diagnosis not present

## 2022-04-29 DIAGNOSIS — Z792 Long term (current) use of antibiotics: Secondary | ICD-10-CM | POA: Diagnosis not present

## 2022-04-29 DIAGNOSIS — E1169 Type 2 diabetes mellitus with other specified complication: Secondary | ICD-10-CM | POA: Diagnosis not present

## 2022-04-29 DIAGNOSIS — B965 Pseudomonas (aeruginosa) (mallei) (pseudomallei) as the cause of diseases classified elsewhere: Secondary | ICD-10-CM | POA: Diagnosis not present

## 2022-04-29 DIAGNOSIS — I2510 Atherosclerotic heart disease of native coronary artery without angina pectoris: Secondary | ICD-10-CM | POA: Diagnosis not present

## 2022-04-29 DIAGNOSIS — T8451XA Infection and inflammatory reaction due to internal right hip prosthesis, initial encounter: Secondary | ICD-10-CM | POA: Diagnosis not present

## 2022-04-29 DIAGNOSIS — N184 Chronic kidney disease, stage 4 (severe): Secondary | ICD-10-CM | POA: Diagnosis not present

## 2022-04-29 DIAGNOSIS — H409 Unspecified glaucoma: Secondary | ICD-10-CM | POA: Diagnosis not present

## 2022-04-29 DIAGNOSIS — G9341 Metabolic encephalopathy: Secondary | ICD-10-CM | POA: Diagnosis not present

## 2022-04-29 DIAGNOSIS — Z9181 History of falling: Secondary | ICD-10-CM | POA: Diagnosis not present

## 2022-04-29 DIAGNOSIS — D631 Anemia in chronic kidney disease: Secondary | ICD-10-CM | POA: Diagnosis not present

## 2022-04-29 DIAGNOSIS — I10 Essential (primary) hypertension: Secondary | ICD-10-CM | POA: Diagnosis not present

## 2022-04-29 DIAGNOSIS — N179 Acute kidney failure, unspecified: Secondary | ICD-10-CM | POA: Diagnosis not present

## 2022-04-29 DIAGNOSIS — G5603 Carpal tunnel syndrome, bilateral upper limbs: Secondary | ICD-10-CM | POA: Diagnosis not present

## 2022-04-29 DIAGNOSIS — E8721 Acute metabolic acidosis: Secondary | ICD-10-CM | POA: Diagnosis not present

## 2022-04-29 DIAGNOSIS — E1122 Type 2 diabetes mellitus with diabetic chronic kidney disease: Secondary | ICD-10-CM | POA: Diagnosis not present

## 2022-04-29 DIAGNOSIS — D696 Thrombocytopenia, unspecified: Secondary | ICD-10-CM | POA: Diagnosis not present

## 2022-04-29 DIAGNOSIS — M1990 Unspecified osteoarthritis, unspecified site: Secondary | ICD-10-CM | POA: Diagnosis not present

## 2022-04-29 NOTE — Telephone Encounter (Signed)
Granddaughter came to office and spoke with Judson Roch

## 2022-05-02 ENCOUNTER — Telehealth: Payer: Self-pay | Admitting: Family Medicine

## 2022-05-02 NOTE — Telephone Encounter (Signed)
Home Health Verbal Orders - Caller/Agency: [facility] Callback Number: [phone] Requesting OT/PT/Skilled Nursing/Social Work/Speech Therapy: PT Frequency: 1w1 2w4 1w4   Strength, gait, balance, and transfer

## 2022-05-02 NOTE — Telephone Encounter (Signed)
Shireen with [facility] is calling in for verbal orders for OT.   Frequency: 1 week 8  CB: [phone] secure voicemail

## 2022-05-03 NOTE — Telephone Encounter (Signed)
Please advise 

## 2022-05-03 NOTE — Telephone Encounter (Signed)
ok 

## 2022-05-03 NOTE — Telephone Encounter (Signed)
Verbal orders given  

## 2022-05-03 NOTE — Telephone Encounter (Signed)
Verbal order given  

## 2022-05-06 ENCOUNTER — Other Ambulatory Visit: Payer: Self-pay | Admitting: Physician Assistant

## 2022-05-06 DIAGNOSIS — E1159 Type 2 diabetes mellitus with other circulatory complications: Secondary | ICD-10-CM

## 2022-05-06 DIAGNOSIS — I2510 Atherosclerotic heart disease of native coronary artery without angina pectoris: Secondary | ICD-10-CM

## 2022-05-06 DIAGNOSIS — G9341 Metabolic encephalopathy: Secondary | ICD-10-CM

## 2022-05-06 DIAGNOSIS — D472 Monoclonal gammopathy: Secondary | ICD-10-CM

## 2022-05-06 DIAGNOSIS — D696 Thrombocytopenia, unspecified: Secondary | ICD-10-CM

## 2022-05-06 DIAGNOSIS — E538 Deficiency of other specified B group vitamins: Secondary | ICD-10-CM

## 2022-05-10 ENCOUNTER — Ambulatory Visit: Payer: Medicare Other | Admitting: Family Medicine

## 2022-05-10 ENCOUNTER — Ambulatory Visit: Payer: Medicare Other | Attending: Infectious Diseases | Admitting: Infectious Diseases

## 2022-05-10 DIAGNOSIS — Z96641 Presence of right artificial hip joint: Secondary | ICD-10-CM | POA: Insufficient documentation

## 2022-05-10 DIAGNOSIS — T8189XA Other complications of procedures, not elsewhere classified, initial encounter: Secondary | ICD-10-CM | POA: Insufficient documentation

## 2022-05-10 DIAGNOSIS — S71101D Unspecified open wound, right thigh, subsequent encounter: Secondary | ICD-10-CM

## 2022-05-10 DIAGNOSIS — H409 Unspecified glaucoma: Secondary | ICD-10-CM | POA: Insufficient documentation

## 2022-05-10 DIAGNOSIS — X58XXXA Exposure to other specified factors, initial encounter: Secondary | ICD-10-CM | POA: Insufficient documentation

## 2022-05-10 DIAGNOSIS — S71001D Unspecified open wound, right hip, subsequent encounter: Secondary | ICD-10-CM

## 2022-05-10 DIAGNOSIS — M25151 Fistula, right hip: Secondary | ICD-10-CM

## 2022-05-10 DIAGNOSIS — I10 Essential (primary) hypertension: Secondary | ICD-10-CM | POA: Diagnosis not present

## 2022-05-10 DIAGNOSIS — E119 Type 2 diabetes mellitus without complications: Secondary | ICD-10-CM | POA: Insufficient documentation

## 2022-05-10 DIAGNOSIS — Z951 Presence of aortocoronary bypass graft: Secondary | ICD-10-CM | POA: Diagnosis not present

## 2022-05-10 NOTE — Progress Notes (Signed)
The purpose of this virtual visit is to provide medical care while limiting exposure to the novel coronavirus (COVID19) for both patient and office staff.   Consent was obtained for video visit:  Yes.   Answered questions that patient had about telehealth interaction:  Yes.   I discussed the limitations, risks, security and privacy concerns of performing an evaluation and management service by telephone. I also discussed with the patient that there may be a patient responsible charge related to this service. The patient expressed understanding and agreed to proceed.   Patient Location: Home Provider Location: office On the visit- granddaughter [name], patient and provider Pt was recently in [facility] between  for rt hip wound and was sent home on cipro  88 y.o. 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  She was hospitalized recently between 9/1-04/21/22 with worsening rt hip pain and discharge from the wound which was dormant for the past year. 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 Wound culture was pseudomonas- no surgical procedure or IR procedure was considered She was dced home on 6 weeks of PO cipro to complete on 05/27/22. 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

## 2022-05-11 ENCOUNTER — Encounter: Payer: Self-pay | Admitting: Family Medicine

## 2022-05-11 ENCOUNTER — Ambulatory Visit (INDEPENDENT_AMBULATORY_CARE_PROVIDER_SITE_OTHER): Payer: Medicare Other | Admitting: Family Medicine

## 2022-05-11 VITALS — BP 112/64 | HR 74 | Temp 98.2°F | Resp 16 | Wt 125.0 lb

## 2022-05-11 DIAGNOSIS — D696 Thrombocytopenia, unspecified: Secondary | ICD-10-CM

## 2022-05-11 DIAGNOSIS — E1122 Type 2 diabetes mellitus with diabetic chronic kidney disease: Secondary | ICD-10-CM | POA: Diagnosis not present

## 2022-05-11 DIAGNOSIS — N179 Acute kidney failure, unspecified: Secondary | ICD-10-CM | POA: Diagnosis not present

## 2022-05-11 DIAGNOSIS — M25551 Pain in right hip: Secondary | ICD-10-CM | POA: Diagnosis not present

## 2022-05-11 DIAGNOSIS — L24A9 Irritant contact dermatitis due friction or contact with other specified body fluids: Secondary | ICD-10-CM

## 2022-05-11 DIAGNOSIS — Z794 Long term (current) use of insulin: Secondary | ICD-10-CM | POA: Diagnosis not present

## 2022-05-11 DIAGNOSIS — T148XXA Other injury of unspecified body region, initial encounter: Secondary | ICD-10-CM | POA: Diagnosis not present

## 2022-05-11 DIAGNOSIS — N189 Chronic kidney disease, unspecified: Secondary | ICD-10-CM

## 2022-05-11 DIAGNOSIS — Z23 Encounter for immunization: Secondary | ICD-10-CM | POA: Diagnosis not present

## 2022-05-11 DIAGNOSIS — N1831 Chronic kidney disease, stage 3a: Secondary | ICD-10-CM | POA: Diagnosis not present

## 2022-05-11 NOTE — Progress Notes (Unsigned)
Established patient visit  I,Gabriela [name],acting as a scribe for [employer], MD.,have documented all relevant documentation on the behalf of Gabriela Foster, MD,as directed by  Gabriela Foster, MD while in the presence of Gabriela Foster, MD.   [name]   DOB: 1926-01-14   88 y.o. Female  MRN: [id number] Visit Date: 05/11/2022  Today's healthcare provider: [name], MD   Chief Complaint  Patient presents with   Hospitalization Follow-up   Subjective    HPI Patient here with her granddaughter [name] Follow up Hospitalization  Patient was admitted to [facility] on 04/15/2022 and discharged on 04/21/2022. She was treated for Hip Pain and T2DM. Treatment for this included Cipro 6 weeks.DC all insulin and take a snack before bed due to hypoglycemia. Telephone follow up was done on 04/25/2022 She reports excellent compliance with treatment. She reports this condition is improved. Sugar levels after breakfast are 125-150, fasting 99-100, 130s after breakfast  ----------------------------------------------------------------------------------------- -   Medications: Outpatient Medications Prior to Visit  Medication Sig   Accu-Chek FastClix Lancets MISC To check blood sugars once a day. DX E11.9   albuterol (VENTOLIN HFA) 108 (90 Base) MCG/ACT inhaler INHALE 1-2 PUFFS INTO THE LUNGS EVERY 4 HOURS AS NEEDED FOR WHEEZING OR SHORTNESS OF BREATH.   aspirin EC 81 MG tablet Take 81 mg by mouth daily.   Blood Glucose Monitoring Suppl (ACCU-CHEK AVIVA PLUS) w/Device KIT 1 each by Does not apply route daily. Dx E11.9   ciprofloxacin (CIPRO) 500 MG tablet Take 1 tablet (500 mg total) by mouth daily at 6 PM.   ferrous sulfate 325 (65 FE) MG tablet Take 1 tablet (325 mg total) by mouth daily with breakfast. TAKE 1 TABLET BY MOUTH DAILY.   furosemide (LASIX) 20 MG tablet TAKE 1 TABLET BY MOUTH EVERY DAY   glucose  blood (ACCU-CHEK AVIVA PLUS) test strip USE AS INSTRUCTED DX E11.9   metoprolol tartrate (LOPRESSOR) 25 MG tablet TAKE 1/2 TABLET BY MOUTH EVERY DAY   SIMBRINZA 1-0.2 % SUSP Apply 1 drop to eye 2 (two) times daily.   timolol (TIMOPTIC) 0.5 % ophthalmic solution Apply to eye.   B-D UF III MINI PEN NEEDLES 31G X 5 MM MISC USE WITH INSULIN DOSES (Patient not taking: Reported on 04/28/2022)   No facility-administered medications prior to visit.    Review of Systems  {Labs  Heme  Chem  Endocrine  Serology  Results Review (optional):23779}   Objective    BP 112/64 (BP Location: Left Arm, Patient Position: Sitting, Cuff Size: Normal)   Pulse 74   Temp 98.2 F (36.8 C) (Oral)   Resp 16   Wt 125 lb (56.7 kg)   BMI 22.86 kg/m  {Show previous vital signs (optional):23777}  Physical Exam Constitutional:      General: She is not in acute distress.    Appearance: Normal appearance. She is not ill-appearing, toxic-appearing or diaphoretic.  Cardiovascular:     Rate and Rhythm: Normal rate and regular rhythm.  Pulmonary:     Breath sounds: No wheezing.  Skin:    Comments: Wound on right superior, anterior thigh  Oozing scant serosanguinous fluid     Neurological:     General: No focal deficit present.     Mental Status: She is alert and oriented to person, place, and time.     Comments: In transport chair      ***  No results found for any visits on 05/11/22.  Assessment & Plan     Problem List Items Addressed This Visit       Other   Non-healing non-surgical wound - Primary   Relevant Orders   Comprehensive metabolic panel   CBC   C-reactive protein   Sed Rate (ESR)   Other Visit Diagnoses     Need for influenza vaccination       Relevant Orders   Flu Vaccine QUAD High Dose(Fluad)   Wound drainage       Relevant Orders   Comprehensive metabolic panel   AKI (acute kidney injury) ([place])       Relevant Orders   Comprehensive metabolic panel        No  follow-ups on file.     I, Gabriela Foster, MD, have reviewed all documentation for this visit. The documentation on 05/11/22 for the exam, diagnosis, procedures, and orders are all accurate and complete.    Gabriela Foster, MD  [facility] [phone] (phone) [phone] (fax)  [place]

## 2022-05-12 ENCOUNTER — Other Ambulatory Visit: Payer: Self-pay | Admitting: Family Medicine

## 2022-05-12 LAB — CBC
Hematocrit: 26 % — ABNORMAL LOW (ref 34.0–46.6)
Hemoglobin: 8.4 g/dL — ABNORMAL LOW (ref 11.1–15.9)
MCH: 25.5 pg — ABNORMAL LOW (ref 26.6–33.0)
MCHC: 32.3 g/dL (ref 31.5–35.7)
MCV: 79 fL (ref 79–97)
Platelets: 70 10*3/uL — CL (ref 150–450)
RBC: 3.29 x10E6/uL — ABNORMAL LOW (ref 3.77–5.28)
RDW: 17.1 % — ABNORMAL HIGH (ref 11.7–15.4)
WBC: 5.1 10*3/uL (ref 3.4–10.8)

## 2022-05-12 LAB — COMPREHENSIVE METABOLIC PANEL
ALT: 5 IU/L (ref 0–32)
AST: 14 IU/L (ref 0–40)
Albumin/Globulin Ratio: 0.9 — ABNORMAL LOW (ref 1.2–2.2)
Albumin: 4 g/dL (ref 3.6–4.6)
Alkaline Phosphatase: 102 IU/L (ref 44–121)
BUN/Creatinine Ratio: 28 (ref 12–28)
BUN: 46 mg/dL — ABNORMAL HIGH (ref 10–36)
Bilirubin Total: 0.6 mg/dL (ref 0.0–1.2)
CO2: 20 mmol/L (ref 20–29)
Calcium: 8.7 mg/dL (ref 8.7–10.3)
Chloride: 104 mmol/L (ref 96–106)
Creatinine, Ser: 1.66 mg/dL — ABNORMAL HIGH (ref 0.57–1.00)
Globulin, Total: 4.4 g/dL (ref 1.5–4.5)
Glucose: 103 mg/dL — ABNORMAL HIGH (ref 70–99)
Potassium: 4.4 mmol/L (ref 3.5–5.2)
Sodium: 142 mmol/L (ref 134–144)
Total Protein: 8.4 g/dL (ref 6.0–8.5)
eGFR: 29 mL/min/{1.73_m2} — ABNORMAL LOW (ref >59)

## 2022-05-12 LAB — SEDIMENTATION RATE: Sed Rate: 96 mm/hr — ABNORMAL HIGH (ref 0–40)

## 2022-05-12 LAB — C-REACTIVE PROTEIN: CRP: 26 mg/L — ABNORMAL HIGH (ref 0–10)

## 2022-05-12 MED ORDER — CYANOCOBALAMIN 1000 MCG PO TABS: 500.0000 ug | ORAL_TABLET | Freq: Every day | ORAL | 0 refills | Status: AC

## 2022-05-12 NOTE — Assessment & Plan Note (Signed)
Hospital problem We will check CMP today to check creatinine Recommended oral hydration as patient initially had difficulty with blood draw that I suspect was secondary to dehydration

## 2022-05-12 NOTE — Assessment & Plan Note (Signed)
This has improved from hospitalization earlier this month, pain has improved since working physical therapy Patient continues to mobilize using transport chair in the clinic today and has

## 2022-05-12 NOTE — Assessment & Plan Note (Signed)
Chronic problem Patient's granddaughter states that they have been evaluated by hematology and due to patient's age did not recommend any further intervention We will check CBC today

## 2022-05-12 NOTE — Assessment & Plan Note (Signed)
Chronic wound, improved  Patient was evaluated by orthopedic surgery while in the hospital and not recommended for repeat hip surgery She has been managing the anterior thigh wound by cleaning it and changing dressing daily Wound has improved  Recommended continue wound care as stated above Patient following with infectious disease for wound infection, currently on ciprofloxacin We will check ESR and CRP as well as CBC today

## 2022-05-12 NOTE — Assessment & Plan Note (Signed)
Chronic, controlled Patient's insulin therapy was stopped in the hospital We will check CMP and glucose levels today Recommended that patient not Restart insulin therapy at this time as patient's granddaughter reports blood glucoses less than 140 postprandial and ranging from 99-100 fasting

## 2022-05-24 ENCOUNTER — Inpatient Hospital Stay
Admission: EM | Admit: 2022-05-24 | Discharge: 2022-05-26 | DRG: 534 | Disposition: A | Discharge status: Other Acute Inpatient Hospital | Payer: Medicare Other | Attending: Emergency Medicine | Admitting: Emergency Medicine

## 2022-05-24 ENCOUNTER — Emergency Department: Payer: Medicare Other

## 2022-05-24 ENCOUNTER — Other Ambulatory Visit: Payer: Self-pay

## 2022-05-24 ENCOUNTER — Encounter (HOSPITAL_COMMUNITY): Payer: Self-pay

## 2022-05-24 DIAGNOSIS — D631 Anemia in chronic kidney disease: Secondary | ICD-10-CM | POA: Diagnosis present

## 2022-05-24 DIAGNOSIS — Z96641 Presence of right artificial hip joint: Secondary | ICD-10-CM | POA: Diagnosis present

## 2022-05-24 DIAGNOSIS — D696 Thrombocytopenia, unspecified: Secondary | ICD-10-CM | POA: Diagnosis present

## 2022-05-24 DIAGNOSIS — W19XXXA Unspecified fall, initial encounter: Secondary | ICD-10-CM

## 2022-05-24 DIAGNOSIS — S72401A Unspecified fracture of lower end of right femur, initial encounter for closed fracture: Secondary | ICD-10-CM | POA: Diagnosis not present

## 2022-05-24 DIAGNOSIS — S72091A Other fracture of head and neck of right femur, initial encounter for closed fracture: Secondary | ICD-10-CM | POA: Diagnosis present

## 2022-05-24 DIAGNOSIS — W1830XA Fall on same level, unspecified, initial encounter: Secondary | ICD-10-CM | POA: Diagnosis present

## 2022-05-24 DIAGNOSIS — N189 Chronic kidney disease, unspecified: Secondary | ICD-10-CM | POA: Diagnosis present

## 2022-05-24 DIAGNOSIS — I129 Hypertensive chronic kidney disease with stage 1 through stage 4 chronic kidney disease, or unspecified chronic kidney disease: Secondary | ICD-10-CM | POA: Diagnosis present

## 2022-05-24 DIAGNOSIS — S72491A Other fracture of lower end of right femur, initial encounter for closed fracture: Principal | ICD-10-CM

## 2022-05-24 DIAGNOSIS — E785 Hyperlipidemia, unspecified: Secondary | ICD-10-CM | POA: Diagnosis present

## 2022-05-24 LAB — COMPREHENSIVE METABOLIC PANEL
ALT: 9 U/L (ref 0–44)
AST: 19 U/L (ref 15–41)
Albumin: 3.4 g/dL — ABNORMAL LOW (ref 3.5–5.0)
Alkaline Phosphatase: 91 U/L (ref 38–126)
Anion gap: 11 (ref 5–15)
BUN: 50 mg/dL — ABNORMAL HIGH (ref 8–23)
CO2: 20 mmol/L — ABNORMAL LOW (ref 22–32)
Calcium: 7.7 mg/dL — ABNORMAL LOW (ref 8.9–10.3)
Chloride: 107 mmol/L (ref 98–111)
Creatinine, Ser: 1.76 mg/dL — ABNORMAL HIGH (ref 0.44–1.00)
GFR, Estimated: 26 mL/min — ABNORMAL LOW (ref >60)
Glucose, Bld: 222 mg/dL — ABNORMAL HIGH (ref 70–99)
Potassium: 3 mmol/L — ABNORMAL LOW (ref 3.5–5.1)
Sodium: 138 mmol/L (ref 135–145)
Total Bilirubin: 0.7 mg/dL (ref 0.3–1.2)
Total Protein: 8.4 g/dL — ABNORMAL HIGH (ref 6.5–8.1)

## 2022-05-24 LAB — SAMPLE TO BLOOD BANK

## 2022-05-24 LAB — CBC WITH DIFFERENTIAL/PLATELET
Abs Immature Granulocytes: 0.03 10*3/uL (ref 0.00–0.07)
Basophils Absolute: 0 10*3/uL (ref 0.0–0.1)
Basophils Relative: 0 %
Eosinophils Absolute: 0 10*3/uL (ref 0.0–0.5)
Eosinophils Relative: 1 %
HCT: 23.4 % — ABNORMAL LOW (ref 36.0–46.0)
Hemoglobin: 7.3 g/dL — ABNORMAL LOW (ref 12.0–15.0)
Immature Granulocytes: 1 %
Lymphocytes Relative: 33 %
Lymphs Abs: 1.6 10*3/uL (ref 0.7–4.0)
MCH: 25.3 pg — ABNORMAL LOW (ref 26.0–34.0)
MCHC: 31.2 g/dL (ref 30.0–36.0)
MCV: 81.3 fL (ref 80.0–100.0)
Monocytes Absolute: 1 10*3/uL (ref 0.1–1.0)
Monocytes Relative: 22 %
Neutro Abs: 2 10*3/uL (ref 1.7–7.7)
Neutrophils Relative %: 43 %
Platelets: 57 10*3/uL — ABNORMAL LOW (ref 150–400)
RBC: 2.88 MIL/uL — ABNORMAL LOW (ref 3.87–5.11)
RDW: 18.7 % — ABNORMAL HIGH (ref 11.5–15.5)
Smear Review: NORMAL
WBC: 4.6 10*3/uL (ref 4.0–10.5)
nRBC: 0 % (ref 0.0–0.2)

## 2022-05-24 LAB — HEMOGLOBIN AND HEMATOCRIT, BLOOD
HCT: 21.5 % — ABNORMAL LOW (ref 36.0–46.0)
Hemoglobin: 6.7 g/dL — ABNORMAL LOW (ref 12.0–15.0)

## 2022-05-24 LAB — CBG MONITORING, ED: Glucose-Capillary: 195 mg/dL — ABNORMAL HIGH (ref 70–99)

## 2022-05-24 MED ORDER — ONDANSETRON HCL 4 MG/2ML IJ SOLN
4.0000 mg | Freq: Once | INTRAMUSCULAR | Status: AC
Start: 2022-05-24 — End: 2022-05-24
  Administered 2022-05-24: 4 mg via INTRAVENOUS

## 2022-05-24 MED ORDER — MORPHINE SULFATE (PF) 4 MG/ML IV SOLN
4.0000 mg | Freq: Once | INTRAVENOUS | Status: AC
  Administered 2022-05-24: 4 mg via INTRAVENOUS
  Filled 2022-05-24: qty 1

## 2022-05-24 MED ORDER — SODIUM CHLORIDE 0.9 % IV SOLN: INTRAVENOUS | Status: AC

## 2022-05-24 MED ORDER — HYDROMORPHONE HCL 1 MG/ML IJ SOLN
1.0000 mg | INTRAMUSCULAR | Status: DC | PRN
  Administered 2022-05-24 (×2): 1 mg via INTRAVENOUS
  Filled 2022-05-24 (×2): qty 1

## 2022-05-24 MED ORDER — ACETAMINOPHEN 325 MG PO TABS
650.0000 mg | ORAL_TABLET | Freq: Four times a day (QID) | ORAL | Status: DC
  Administered 2022-05-24 – 2022-05-26 (×5): 650 mg via ORAL
  Filled 2022-05-24 (×5): qty 2

## 2022-05-24 MED ORDER — CEFAZOLIN SODIUM-DEXTROSE 1-4 GM/50ML-% IV SOLN
1.0000 g | Freq: Three times a day (TID) | INTRAVENOUS | Status: DC
  Administered 2022-05-24 – 2022-05-26 (×4): 1 g via INTRAVENOUS
  Filled 2022-05-24 (×6): qty 50

## 2022-05-24 MED ORDER — FENTANYL CITRATE PF 50 MCG/ML IJ SOSY
50.0000 ug | PREFILLED_SYRINGE | INTRAMUSCULAR | Status: DC | PRN
  Administered 2022-05-25 – 2022-05-26 (×4): 50 ug via INTRAVENOUS
  Filled 2022-05-24 (×4): qty 1

## 2022-05-24 MED ORDER — CIPROFLOXACIN IN D5W 400 MG/200ML IV SOLN
400.0000 mg | Freq: Two times a day (BID) | INTRAVENOUS | Status: DC
  Administered 2022-05-24: 400 mg via INTRAVENOUS
  Filled 2022-05-24: qty 200

## 2022-05-24 MED ORDER — SODIUM CHLORIDE 0.9 % IV SOLN
25.0000 mg | Freq: Four times a day (QID) | INTRAVENOUS | Status: DC | PRN
  Administered 2022-05-24: 25 mg via INTRAVENOUS
  Filled 2022-05-24: qty 1

## 2022-05-24 MED ORDER — HYDROMORPHONE HCL 1 MG/ML IJ SOLN
1.0000 mg | Freq: Once | INTRAMUSCULAR | Status: AC
  Administered 2022-05-24: 1 mg via INTRAVENOUS
  Filled 2022-05-24: qty 1

## 2022-05-24 MED ORDER — ONDANSETRON HCL 4 MG/2ML IJ SOLN
4.0000 mg | Freq: Four times a day (QID) | INTRAMUSCULAR | Status: DC | PRN
  Administered 2022-05-24: 4 mg via INTRAVENOUS
  Filled 2022-05-24 (×2): qty 2

## 2022-05-24 NOTE — ED Notes (Signed)
Called  [facility]  pt  waiting  on  bed  at  [facility]

## 2022-05-24 NOTE — Plan of Care (Signed)
Transferred from [facility] Gabriela [name] is a 88 year old female with pmh DM type II, CKD stage IV, and  pancytopenia who presents after having a fall while undergoing rehab at [facility] for prior right hip replacement.  Patient found to have a communicated fracture of the distal femoral metaphysis of the right leg.  Dr. Marcelino Scot orthopedics consulted at [facility] due to the complexity of case.  Of note patient's hemoglobin was proximately 1 g lower than prior at 7.3 and platelet count 57.  No reports of bleeding.  Recommended ED provider to notify Dr. Marcelino Scot to get recommendations if he has any specific requirements for proceeding with surgery in regards to patient's platelet count or hemoglobin

## 2022-05-24 NOTE — ED Notes (Signed)
Called  [facility]  spoke  with Gabriela [name]  pt  still on  waitlist  for   bed  informed  RN Philis Kendall

## 2022-05-24 NOTE — ED Triage Notes (Addendum)
Pt come via [facility] from home with c/o fall while doing PT. Pt states pain to right knee. Pt was given 50 total of fent. Pt does have deformity noted and swelling.   Wound located on right thigh and being medicated for it.  VSS [id number]  20 l lac

## 2022-05-24 NOTE — ED Notes (Signed)
Pt bedding and brief changed. Purwic placed

## 2022-05-24 NOTE — ED Notes (Signed)
Called [facility] and spoke with [place] about possible transfer with a right distal femur fx, will call us back

## 2022-05-24 NOTE — ED Provider Notes (Addendum)
[facility] Provider Note   Event Date/Time   First MD Initiated Contact with Patient 05/24/22 1120     (approximate) History  Fall  HPI Gabriela [name] is a 88 y.o. female with a past medical history of a right hip replacement, hypertension, hyperlipidemia and CKD who presents after a mechanical fall from standing while at PT earlier today directly down onto her right knee patient states that she has been unable to put any weight on this knee since it happened and now has a deformity overlying this right knee joint.  Patient endorses intact sensation distally but is unable to move that knee without significant pain. ROS: Patient currently denies any vision changes, tinnitus, difficulty speaking, facial droop, sore throat, chest pain, shortness of breath, abdominal pain, nausea/vomiting/diarrhea, dysuria, or weakness/numbness/paresthesias in any extremity   Physical Exam  Triage Vital Signs: ED Triage Vitals [05/24/22 1116]  Enc Vitals Group     BP      Pulse      Resp      Temp      Temp src      SpO2      Weight      Height      Head Circumference      Peak Flow      Pain Score 9     Pain Loc      Pain Edu?      Excl. in [place]?    Most recent vital signs: Vitals:   05/24/22 1121 05/24/22 1344  BP: (!) 167/74 132/64  Pulse: 88 80  Resp: (!) 31 (!) 26  Temp: (!) 97.5 F (36.4 C) 98 F (36.7 C)  SpO2: 100%    General: Awake, oriented x4. CV:  Good peripheral perfusion.  Resp:  Normal effort.  Abd:  No distention.  Other:  Elderly African-American female laying in bed in moderate distress secondary to pain with obvious deformity at the right knee with it being held in 45 degree flexion on the bed ED Results / Procedures / Treatments  Labs (all labs ordered are listed, but only abnormal results are displayed) Labs Reviewed  COMPREHENSIVE METABOLIC PANEL - Abnormal; Notable for the following components:      Result Value   Potassium 3.0 (*)     CO2 20 (*)    Glucose, Bld 222 (*)    BUN 50 (*)    Creatinine, Ser 1.76 (*)    Calcium 7.7 (*)    Total Protein 8.4 (*)    Albumin 3.4 (*)    GFR, Estimated 26 (*)    All other components within normal limits  CBC WITH DIFFERENTIAL/PLATELET - Abnormal; Notable for the following components:   RBC 2.88 (*)    Hemoglobin 7.3 (*)    HCT 23.4 (*)    MCH 25.3 (*)    RDW 18.7 (*)    Platelets 57 (*)    All other components within normal limits  SAMPLE TO BLOOD BANK  RADIOLOGY ED MD interpretation: X-ray of the right knee interpreted by me independently shows acute comminuted fracture of the distal femoral metaphysis with moderate medial apex angulation and high-grade approximately 90% posterior angulation of the distal fracture component -Agree with radiology assessment Official radiology report(s): DG Knee 1-2 Views Right  Result Date: 05/24/2022 CLINICAL DATA:  Fall.  Deformity. EXAM: RIGHT KNEE - 1-2 VIEW COMPARISON:  Right knee radiographs 05/19/2018 FINDINGS: There is diffuse decreased bone mineralization. There is horizontal linear lucency,  mild cortical step-off, and comminution of the mediolateral cortices of the distal femoral metaphysis on frontal view. There is moderate approximate 27 degree medial apex angulation of this fracture on frontal view. On lateral view there is high-grade approximately 90% angulation of the distal femoral metaphysis, with the distal fracture component angulated in posterior/flexion positioning compared to the proximal fracture component. No definite acute fracture is seen within the visualized proximal tibia or fibula. Tiny joint effusion. Mild chronic enthesopathic change at the quadriceps insertion on the patella. Moderate to high-grade vascular calcifications. IMPRESSION: Acute, comminuted fracture of the distal femoral metaphysis with moderate medial apex angulation and high-grade approximately 90% posterior angulation of the distal fracture component.  Electronically Signed   By: Yvonne Kendall M.D.   On: 05/24/2022 11:58   PROCEDURES: Critical Care performed: No .1-3 Lead EKG Interpretation  Performed by: Naaman Plummer, MD Authorized by: Naaman Plummer, MD     Interpretation: normal     ECG rate:  87   ECG rate assessment: normal     Rhythm: sinus rhythm     Ectopy: none     Conduction: normal    MEDICATIONS ORDERED IN ED: Medications  ondansetron (ZOFRAN) injection 4 mg (4 mg Intravenous Given 05/24/22 1131)  morphine (PF) 4 MG/ML injection 4 mg (4 mg Intravenous Given 05/24/22 1132)  HYDROmorphone (DILAUDID) injection 1 mg (1 mg Intravenous Given 05/24/22 1246)  ondansetron (ZOFRAN) injection 4 mg (4 mg Intravenous Given 05/24/22 1339)   IMPRESSION / MDM / ASSESSMENT AND PLAN / ED COURSE  I reviewed the triage vital signs and the nursing notes.                             The patient is on the cardiac monitor to evaluate for evidence of arrhythmia and/or significant heart rate changes. Patient's presentation is most consistent with acute presentation with potential threat to life or bodily function. The Pt was found to have a closed distal femur fracture on XR. The Pt is otherwise well appearing, hemodynamically stable, and shows no evidence of neurovascular injury or compartment syndrome.  I have low suspicion for dislocation, significant ligamentous injury, septic arthritis, gout flare, new autoimmune arthropathy, or gonococcal arthropathy.  Given patient's significant fracture and inability to ambulate, she will be admitted to the internal medicine service with orthopedics following for likely surgical repair  Tx: Pain and nausea control as needed Dispo: Transfer to [facility]   FINAL CLINICAL IMPRESSION(S) / ED DIAGNOSES   Final diagnoses:  Other closed fracture of distal end of right femur, initial encounter ([place])  Fall, initial encounter   Rx / DC Orders   ED Discharge Orders     None      Note:   This document was prepared using Dragon voice recognition software and may include unintentional dictation errors.   Naaman Plummer, MD 05/24/22 1349    Naaman Plummer, MD 05/24/22 [phone]

## 2022-05-25 ENCOUNTER — Inpatient Hospital Stay: Payer: Medicare Other

## 2022-05-25 DIAGNOSIS — D72829 Elevated white blood cell count, unspecified: Secondary | ICD-10-CM | POA: Diagnosis not present

## 2022-05-25 DIAGNOSIS — W1830XA Fall on same level, unspecified, initial encounter: Secondary | ICD-10-CM | POA: Diagnosis present

## 2022-05-25 DIAGNOSIS — T148XXA Other injury of unspecified body region, initial encounter: Secondary | ICD-10-CM | POA: Diagnosis not present

## 2022-05-25 DIAGNOSIS — N189 Chronic kidney disease, unspecified: Secondary | ICD-10-CM | POA: Diagnosis present

## 2022-05-25 DIAGNOSIS — S72412A Displaced unspecified condyle fracture of lower end of left femur, initial encounter for closed fracture: Secondary | ICD-10-CM | POA: Diagnosis not present

## 2022-05-25 DIAGNOSIS — S72002A Fracture of unspecified part of neck of left femur, initial encounter for closed fracture: Secondary | ICD-10-CM | POA: Diagnosis not present

## 2022-05-25 DIAGNOSIS — A498 Other bacterial infections of unspecified site: Secondary | ICD-10-CM | POA: Diagnosis not present

## 2022-05-25 DIAGNOSIS — E785 Hyperlipidemia, unspecified: Secondary | ICD-10-CM | POA: Diagnosis present

## 2022-05-25 DIAGNOSIS — D472 Monoclonal gammopathy: Secondary | ICD-10-CM | POA: Diagnosis not present

## 2022-05-25 DIAGNOSIS — R509 Fever, unspecified: Secondary | ICD-10-CM | POA: Diagnosis not present

## 2022-05-25 DIAGNOSIS — D61818 Other pancytopenia: Secondary | ICD-10-CM | POA: Diagnosis not present

## 2022-05-25 DIAGNOSIS — Z96641 Presence of right artificial hip joint: Secondary | ICD-10-CM | POA: Diagnosis present

## 2022-05-25 DIAGNOSIS — S72401A Unspecified fracture of lower end of right femur, initial encounter for closed fracture: Secondary | ICD-10-CM | POA: Diagnosis present

## 2022-05-25 DIAGNOSIS — D631 Anemia in chronic kidney disease: Secondary | ICD-10-CM | POA: Diagnosis present

## 2022-05-25 DIAGNOSIS — S728X1A Other fracture of right femur, initial encounter for closed fracture: Secondary | ICD-10-CM | POA: Diagnosis not present

## 2022-05-25 DIAGNOSIS — T8450XA Infection and inflammatory reaction due to unspecified internal joint prosthesis, initial encounter: Secondary | ICD-10-CM | POA: Diagnosis not present

## 2022-05-25 DIAGNOSIS — N184 Chronic kidney disease, stage 4 (severe): Secondary | ICD-10-CM | POA: Diagnosis not present

## 2022-05-25 DIAGNOSIS — E1121 Type 2 diabetes mellitus with diabetic nephropathy: Secondary | ICD-10-CM | POA: Diagnosis not present

## 2022-05-25 DIAGNOSIS — I129 Hypertensive chronic kidney disease with stage 1 through stage 4 chronic kidney disease, or unspecified chronic kidney disease: Secondary | ICD-10-CM | POA: Diagnosis present

## 2022-05-25 DIAGNOSIS — S72091A Other fracture of head and neck of right femur, initial encounter for closed fracture: Secondary | ICD-10-CM | POA: Diagnosis present

## 2022-05-25 DIAGNOSIS — D696 Thrombocytopenia, unspecified: Secondary | ICD-10-CM | POA: Diagnosis present

## 2022-05-25 DIAGNOSIS — I2510 Atherosclerotic heart disease of native coronary artery without angina pectoris: Secondary | ICD-10-CM | POA: Diagnosis not present

## 2022-05-25 DIAGNOSIS — S72331A Displaced oblique fracture of shaft of right femur, initial encounter for closed fracture: Secondary | ICD-10-CM | POA: Diagnosis not present

## 2022-05-25 LAB — PREPARE RBC (CROSSMATCH)

## 2022-05-25 LAB — CBG MONITORING, ED: Glucose-Capillary: 189 mg/dL — ABNORMAL HIGH (ref 70–99)

## 2022-05-25 MED ORDER — MORPHINE SULFATE (PF) 4 MG/ML IV SOLN: 4.0000 mg | Freq: Once | INTRAVENOUS | Status: DC

## 2022-05-25 MED ORDER — SODIUM CHLORIDE 0.9 % IV SOLN: 10.0000 mL/h | Freq: Once | INTRAVENOUS | Status: DC

## 2022-05-25 MED ORDER — HYDROMORPHONE HCL 1 MG/ML IJ SOLN
1.0000 mg | Freq: Once | INTRAMUSCULAR | Status: AC
  Administered 2022-05-25: 1 mg via INTRAVENOUS
  Filled 2022-05-25: qty 1

## 2022-05-25 NOTE — ED Notes (Signed)
Xray at BS 

## 2022-05-25 NOTE — ED Notes (Signed)
EDP at [facility] for RLE reduction, pain med given, family at [facility].

## 2022-05-25 NOTE — ED Notes (Signed)
3mn post transfusion start VSS

## 2022-05-25 NOTE — ED Notes (Signed)
Pt alert, NAD, calm, interactive, resps e/u, VSS.

## 2022-05-25 NOTE — ED Notes (Signed)
Xrays to be done portable, at [facility].

## 2022-05-25 NOTE — ED Notes (Signed)
Repositioned for comfort.  Call light in reach. Rails up for safety.

## 2022-05-25 NOTE — ED Notes (Signed)
Pt consumed half a container of applesauce and a few sips of water.  Repositioned for comfort with pillows and extra blankets to support right femur fracture.  Call light in reach.  Rails up for safety.

## 2022-05-25 NOTE — ED Provider Notes (Signed)
-----------------------------------------   5:48 AM on 05/25/2022 -----------------------------------------   No events overnight.  Patient remains in the emergency department pending transfer to [facility] pending bed availability.   Paulette Blanch, MD 05/25/22 [phone]

## 2022-05-25 NOTE — ED Notes (Signed)
EDP into see and update pt/family

## 2022-05-25 NOTE — ED Notes (Signed)
Pt alert, NAD, calm, interactive, resps e/u. Family at [facility]. Mentions some pain. Also reports some congestion/phlegm. Denies nausea, or sob.

## 2022-05-25 NOTE — ED Notes (Signed)
RLE reduced and splinted. Post Xray pending.

## 2022-05-25 NOTE — ED Notes (Signed)
Pt family updated by this RN

## 2022-05-25 NOTE — ED Notes (Signed)
EDP at BS 

## 2022-05-25 NOTE — ED Notes (Addendum)
Repositioned for comfort. IV continues infusing without redness or edema noted.

## 2022-05-25 NOTE — ED Provider Notes (Signed)
.  Ortho Injury Treatment  Date/Time: 05/25/2022 12:18 PM  Performed by: Carrie Mew, MD Authorized by: Carrie Mew, MD   Consent:    Consent obtained:  Verbal   Consent given by:  Patient   Risks discussed:  Fracture, restricted joint movement, stiffness and nerve damage   Alternatives discussed:  ImmobilizationInjury location: upper leg Location details: right upper leg Injury type: fracture Fracture type: femoral shaft Pre-procedure neurovascular assessment: neurovascularly intact Pre-procedure distal perfusion: normal Pre-procedure neurological function: normal Pre-procedure range of motion: reduced  Anesthesia: Local anesthesia used: no  Patient sedated: NoManipulation performed: yes Skeletal traction used: yes Reduction successful: yes X-ray confirmed reduction: yes Immobilization: splint Splint type: long leg Splint Applied by: ED Provider and ED Tech Supplies used: cotton padding, elastic bandage and Ortho-Glass Post-procedure neurovascular assessment: post-procedure neurovascularly intact Post-procedure distal perfusion: normal Post-procedure neurological function: normal Post-procedure range of motion: improved Comments:      .Critical Care  Performed by: Carrie Mew, MD Authorized by: Carrie Mew, MD   Critical care provider statement:    Critical care time (minutes):  35   Critical care time was exclusive of:  Separately billable procedures and treating other patients   Critical care was necessary to treat or prevent imminent or life-threatening deterioration of the following conditions:  Circulatory failure   Critical care was time spent personally by me on the following activities:  Development of treatment plan with patient or surrogate, discussions with consultants, evaluation of patient's response to treatment, examination of patient, obtaining history from patient or surrogate, ordering and performing treatments and interventions,  ordering and review of laboratory studies, ordering and review of radiographic studies, pulse oximetry, re-evaluation of patient's condition and review of old charts      ----------------------------------------- 12:17 PM on 05/25/2022 ----------------------------------------- Patient still waiting for transfer to [facility].  With the 24-hour delay and being able to arrive at receiving hospital, I discussed her preop management with [facility].  They request transfusion of 3 units packed red blood cells for severe anemia and 1 pack of platelets for preop optimization.  They also recommend reduction and splinting in long-leg splint is much as feasible at this point, which has been performed.  Will obtain follow-up x-rays.  Compartments are still soft, no sign of compartment syndrome or large soft tissue hemorrhage in the thigh.Carrie Mew, MD 05/25/22 1220

## 2022-05-25 NOTE — ED Notes (Signed)
Called [facility] spoke to [facility] for update, no bed assignment and no projections to when  1105

## 2022-05-25 NOTE — ED Notes (Signed)
Delay in some xrays d/t active transfusion, will notify xray when complete

## 2022-05-25 NOTE — ED Notes (Addendum)
Pt family updated with wait, plan, process, timeframe. Answers vague d/t unknown wait. No ortho or IM hospitalist assigned. EDP notified, and will see. CN notified. NS seeking/ prompting updates on bed assignment Qshift. Last updates 7am.

## 2022-05-25 NOTE — Progress Notes (Signed)
Orthopedic trauma service  Ortho at [facility] requested transfer to [facility] to the orthopedic trauma service for treatment of right distal femur fracture below a right total hip arthroplasty.  Awaiting transfer to the hospitalist team here at [facility] pending bed availability.   Contacted by EDP today noting that patient's H&H is 6.7 and 21.5, platelets yesterday 57,000.  Patient's platelets seem to run below 100,000 chronically (looking at last year of labs available) along with chronic anemia likely due to her renal disease Also informed that the patient is not in any type of immobilization device.  I requested that patient be straightened out and placed into a long-leg splint.  This should improve comfort as well as help quell additional blood loss  As patient is requiring surgery to address her fracture would recommend transfusion with 3 units of packed red cells as well as 1 pack of platelets to optimize her for surgery.  Will review our OR schedule for tomorrow.  Hopeful to get her on for early afternoon provided there are no acute traumas that come in overnight  Please make n.p.o. after midnight CBC in am   Jari Pigg, PA-C [phone] (C) 05/25/2022, 10:49 AM  [facility] [place]  [phone] (F)      Patient ID: SHILA KRUCZEK, female   DOB: [birth year], 88 y.o.   MRN: [id number]

## 2022-05-26 ENCOUNTER — Other Ambulatory Visit: Payer: Self-pay

## 2022-05-26 ENCOUNTER — Inpatient Hospital Stay (HOSPITAL_COMMUNITY)
Admission: RE | Admit: 2022-05-26 | Discharge: 2022-06-02 | DRG: 481 | Disposition: A | Discharge status: Rehabilitation Facility | Payer: Medicare Other | Attending: Internal Medicine | Admitting: Internal Medicine

## 2022-05-26 ENCOUNTER — Inpatient Hospital Stay (HOSPITAL_COMMUNITY): Payer: Medicare Other

## 2022-05-26 ENCOUNTER — Inpatient Hospital Stay (HOSPITAL_COMMUNITY): Admission: RE | Admit: 2022-05-26 | Payer: Medicare Other | Source: Home / Self Care | Admitting: Orthopedic Surgery

## 2022-05-26 ENCOUNTER — Encounter (HOSPITAL_COMMUNITY): Payer: Self-pay | Admitting: Internal Medicine

## 2022-05-26 ENCOUNTER — Inpatient Hospital Stay (HOSPITAL_COMMUNITY): Payer: Medicare Other | Admitting: Anesthesiology

## 2022-05-26 ENCOUNTER — Encounter (HOSPITAL_COMMUNITY)
Admission: RE | Disposition: A | Discharge status: Rehabilitation Facility | Payer: Self-pay | Source: Home / Self Care | Attending: Internal Medicine

## 2022-05-26 DIAGNOSIS — Z951 Presence of aortocoronary bypass graft: Secondary | ICD-10-CM | POA: Diagnosis not present

## 2022-05-26 DIAGNOSIS — D631 Anemia in chronic kidney disease: Secondary | ICD-10-CM | POA: Diagnosis present

## 2022-05-26 DIAGNOSIS — H409 Unspecified glaucoma: Secondary | ICD-10-CM | POA: Diagnosis present

## 2022-05-26 DIAGNOSIS — E860 Dehydration: Secondary | ICD-10-CM | POA: Diagnosis not present

## 2022-05-26 DIAGNOSIS — S72002A Fracture of unspecified part of neck of left femur, initial encounter for closed fracture: Secondary | ICD-10-CM

## 2022-05-26 DIAGNOSIS — Y92538 Other ambulatory health services establishments as the place of occurrence of the external cause: Secondary | ICD-10-CM | POA: Diagnosis present

## 2022-05-26 DIAGNOSIS — D472 Monoclonal gammopathy: Secondary | ICD-10-CM | POA: Diagnosis present

## 2022-05-26 DIAGNOSIS — M86651 Other chronic osteomyelitis, right thigh: Secondary | ICD-10-CM | POA: Diagnosis present

## 2022-05-26 DIAGNOSIS — K5901 Slow transit constipation: Secondary | ICD-10-CM | POA: Diagnosis not present

## 2022-05-26 DIAGNOSIS — D61818 Other pancytopenia: Secondary | ICD-10-CM | POA: Diagnosis present

## 2022-05-26 DIAGNOSIS — I2510 Atherosclerotic heart disease of native coronary artery without angina pectoris: Secondary | ICD-10-CM

## 2022-05-26 DIAGNOSIS — Z882 Allergy status to sulfonamides status: Secondary | ICD-10-CM | POA: Diagnosis not present

## 2022-05-26 DIAGNOSIS — D6959 Other secondary thrombocytopenia: Secondary | ICD-10-CM | POA: Diagnosis present

## 2022-05-26 DIAGNOSIS — Z7982 Long term (current) use of aspirin: Secondary | ICD-10-CM

## 2022-05-26 DIAGNOSIS — S7292XA Unspecified fracture of left femur, initial encounter for closed fracture: Secondary | ICD-10-CM | POA: Diagnosis present

## 2022-05-26 DIAGNOSIS — S72492D Other fracture of lower end of left femur, subsequent encounter for closed fracture with routine healing: Secondary | ICD-10-CM | POA: Diagnosis not present

## 2022-05-26 DIAGNOSIS — W1830XD Fall on same level, unspecified, subsequent encounter: Secondary | ICD-10-CM | POA: Diagnosis not present

## 2022-05-26 DIAGNOSIS — Z8249 Family history of ischemic heart disease and other diseases of the circulatory system: Secondary | ICD-10-CM | POA: Diagnosis not present

## 2022-05-26 DIAGNOSIS — M25561 Pain in right knee: Secondary | ICD-10-CM | POA: Diagnosis present

## 2022-05-26 DIAGNOSIS — S7291XA Unspecified fracture of right femur, initial encounter for closed fracture: Principal | ICD-10-CM | POA: Diagnosis present

## 2022-05-26 DIAGNOSIS — E875 Hyperkalemia: Secondary | ICD-10-CM | POA: Diagnosis not present

## 2022-05-26 DIAGNOSIS — S72462A Displaced supracondylar fracture with intracondylar extension of lower end of left femur, initial encounter for closed fracture: Secondary | ICD-10-CM | POA: Diagnosis not present

## 2022-05-26 DIAGNOSIS — S72451A Displaced supracondylar fracture without intracondylar extension of lower end of right femur, initial encounter for closed fracture: Secondary | ICD-10-CM

## 2022-05-26 DIAGNOSIS — W1830XA Fall on same level, unspecified, initial encounter: Secondary | ICD-10-CM | POA: Diagnosis present

## 2022-05-26 DIAGNOSIS — S72401A Unspecified fracture of lower end of right femur, initial encounter for closed fracture: Secondary | ICD-10-CM | POA: Diagnosis not present

## 2022-05-26 DIAGNOSIS — N189 Chronic kidney disease, unspecified: Secondary | ICD-10-CM

## 2022-05-26 DIAGNOSIS — D696 Thrombocytopenia, unspecified: Secondary | ICD-10-CM | POA: Diagnosis present

## 2022-05-26 DIAGNOSIS — E559 Vitamin D deficiency, unspecified: Secondary | ICD-10-CM | POA: Diagnosis present

## 2022-05-26 DIAGNOSIS — R636 Underweight: Secondary | ICD-10-CM | POA: Diagnosis present

## 2022-05-26 DIAGNOSIS — I10 Essential (primary) hypertension: Secondary | ICD-10-CM | POA: Diagnosis not present

## 2022-05-26 DIAGNOSIS — Z9049 Acquired absence of other specified parts of digestive tract: Secondary | ICD-10-CM | POA: Diagnosis not present

## 2022-05-26 DIAGNOSIS — T148XXA Other injury of unspecified body region, initial encounter: Secondary | ICD-10-CM

## 2022-05-26 DIAGNOSIS — E1121 Type 2 diabetes mellitus with diabetic nephropathy: Secondary | ICD-10-CM | POA: Diagnosis not present

## 2022-05-26 DIAGNOSIS — S72412A Displaced unspecified condyle fracture of lower end of left femur, initial encounter for closed fracture: Secondary | ICD-10-CM | POA: Diagnosis not present

## 2022-05-26 DIAGNOSIS — I129 Hypertensive chronic kidney disease with stage 1 through stage 4 chronic kidney disease, or unspecified chronic kidney disease: Secondary | ICD-10-CM | POA: Diagnosis present

## 2022-05-26 DIAGNOSIS — E876 Hypokalemia: Secondary | ICD-10-CM | POA: Diagnosis present

## 2022-05-26 DIAGNOSIS — R509 Fever, unspecified: Secondary | ICD-10-CM | POA: Diagnosis not present

## 2022-05-26 DIAGNOSIS — L89616 Pressure-induced deep tissue damage of right heel: Secondary | ICD-10-CM | POA: Diagnosis not present

## 2022-05-26 DIAGNOSIS — Z792 Long term (current) use of antibiotics: Secondary | ICD-10-CM

## 2022-05-26 DIAGNOSIS — E1122 Type 2 diabetes mellitus with diabetic chronic kidney disease: Secondary | ICD-10-CM | POA: Diagnosis present

## 2022-05-26 DIAGNOSIS — M21371 Foot drop, right foot: Secondary | ICD-10-CM | POA: Diagnosis present

## 2022-05-26 DIAGNOSIS — Z833 Family history of diabetes mellitus: Secondary | ICD-10-CM

## 2022-05-26 DIAGNOSIS — B965 Pseudomonas (aeruginosa) (mallei) (pseudomallei) as the cause of diseases classified elsewhere: Secondary | ICD-10-CM | POA: Diagnosis present

## 2022-05-26 DIAGNOSIS — S72331A Displaced oblique fracture of shaft of right femur, initial encounter for closed fracture: Secondary | ICD-10-CM | POA: Diagnosis not present

## 2022-05-26 DIAGNOSIS — D72829 Elevated white blood cell count, unspecified: Secondary | ICD-10-CM

## 2022-05-26 DIAGNOSIS — T8450XA Infection and inflammatory reaction due to unspecified internal joint prosthesis, initial encounter: Secondary | ICD-10-CM | POA: Diagnosis not present

## 2022-05-26 DIAGNOSIS — Z96641 Presence of right artificial hip joint: Secondary | ICD-10-CM | POA: Diagnosis present

## 2022-05-26 DIAGNOSIS — E785 Hyperlipidemia, unspecified: Secondary | ICD-10-CM | POA: Diagnosis present

## 2022-05-26 DIAGNOSIS — D62 Acute posthemorrhagic anemia: Secondary | ICD-10-CM | POA: Diagnosis not present

## 2022-05-26 DIAGNOSIS — S728X9A Other fracture of unspecified femur, initial encounter for closed fracture: Secondary | ICD-10-CM | POA: Diagnosis not present

## 2022-05-26 DIAGNOSIS — E1129 Type 2 diabetes mellitus with other diabetic kidney complication: Secondary | ICD-10-CM | POA: Diagnosis present

## 2022-05-26 DIAGNOSIS — S72491D Other fracture of lower end of right femur, subsequent encounter for closed fracture with routine healing: Secondary | ICD-10-CM | POA: Diagnosis not present

## 2022-05-26 DIAGNOSIS — M1990 Unspecified osteoarthritis, unspecified site: Secondary | ICD-10-CM | POA: Diagnosis present

## 2022-05-26 DIAGNOSIS — M80052A Age-related osteoporosis with current pathological fracture, left femur, initial encounter for fracture: Secondary | ICD-10-CM | POA: Diagnosis present

## 2022-05-26 DIAGNOSIS — R32 Unspecified urinary incontinence: Secondary | ICD-10-CM | POA: Diagnosis present

## 2022-05-26 DIAGNOSIS — M80051A Age-related osteoporosis with current pathological fracture, right femur, initial encounter for fracture: Secondary | ICD-10-CM | POA: Diagnosis present

## 2022-05-26 DIAGNOSIS — M898X9 Other specified disorders of bone, unspecified site: Secondary | ICD-10-CM | POA: Diagnosis present

## 2022-05-26 DIAGNOSIS — Z681 Body mass index (BMI) 19 or less, adult: Secondary | ICD-10-CM | POA: Diagnosis not present

## 2022-05-26 DIAGNOSIS — N184 Chronic kidney disease, stage 4 (severe): Secondary | ICD-10-CM | POA: Diagnosis present

## 2022-05-26 DIAGNOSIS — M1712 Unilateral primary osteoarthritis, left knee: Secondary | ICD-10-CM | POA: Diagnosis present

## 2022-05-26 DIAGNOSIS — M25551 Pain in right hip: Secondary | ICD-10-CM | POA: Diagnosis present

## 2022-05-26 DIAGNOSIS — S72401D Unspecified fracture of lower end of right femur, subsequent encounter for closed fracture with routine healing: Secondary | ICD-10-CM | POA: Diagnosis present

## 2022-05-26 DIAGNOSIS — R001 Bradycardia, unspecified: Secondary | ICD-10-CM | POA: Diagnosis not present

## 2022-05-26 DIAGNOSIS — Z79899 Other long term (current) drug therapy: Secondary | ICD-10-CM

## 2022-05-26 DIAGNOSIS — S728X1A Other fracture of right femur, initial encounter for closed fracture: Secondary | ICD-10-CM | POA: Diagnosis not present

## 2022-05-26 DIAGNOSIS — A498 Other bacterial infections of unspecified site: Secondary | ICD-10-CM | POA: Diagnosis not present

## 2022-05-26 HISTORY — PX: ORIF FEMUR FRACTURE: SHX2119

## 2022-05-26 LAB — CBC WITH DIFFERENTIAL/PLATELET
Abs Immature Granulocytes: 0.52 10*3/uL — ABNORMAL HIGH (ref 0.00–0.07)
Basophils Absolute: 0.1 10*3/uL (ref 0.0–0.1)
Basophils Relative: 1 %
Eosinophils Absolute: 0 10*3/uL (ref 0.0–0.5)
Eosinophils Relative: 0 %
HCT: 31 % — ABNORMAL LOW (ref 36.0–46.0)
Hemoglobin: 10.3 g/dL — ABNORMAL LOW (ref 12.0–15.0)
Immature Granulocytes: 3 %
Lymphocytes Relative: 8 %
Lymphs Abs: 1.6 10*3/uL (ref 0.7–4.0)
MCH: 27.3 pg (ref 26.0–34.0)
MCHC: 33.2 g/dL (ref 30.0–36.0)
MCV: 82.2 fL (ref 80.0–100.0)
Monocytes Absolute: 9.1 10*3/uL — ABNORMAL HIGH (ref 0.1–1.0)
Monocytes Relative: 47 %
Neutro Abs: 7.8 10*3/uL — ABNORMAL HIGH (ref 1.7–7.7)
Neutrophils Relative %: 41 %
Platelets: 69 10*3/uL — ABNORMAL LOW (ref 150–400)
RBC: 3.77 MIL/uL — ABNORMAL LOW (ref 3.87–5.11)
RDW: 18 % — ABNORMAL HIGH (ref 11.5–15.5)
Smear Review: NORMAL
WBC: 19.1 10*3/uL — ABNORMAL HIGH (ref 4.0–10.5)
nRBC: 0 % (ref 0.0–0.2)

## 2022-05-26 LAB — COMPREHENSIVE METABOLIC PANEL
ALT: 7 U/L (ref 0–44)
AST: 24 U/L (ref 15–41)
Albumin: 3 g/dL — ABNORMAL LOW (ref 3.5–5.0)
Alkaline Phosphatase: 77 U/L (ref 38–126)
Anion gap: 10 (ref 5–15)
BUN: 42 mg/dL — ABNORMAL HIGH (ref 8–23)
CO2: 20 mmol/L — ABNORMAL LOW (ref 22–32)
Calcium: 7.8 mg/dL — ABNORMAL LOW (ref 8.9–10.3)
Chloride: 108 mmol/L (ref 98–111)
Creatinine, Ser: 1.54 mg/dL — ABNORMAL HIGH (ref 0.44–1.00)
GFR, Estimated: 31 mL/min — ABNORMAL LOW (ref >60)
Glucose, Bld: 243 mg/dL — ABNORMAL HIGH (ref 70–99)
Potassium: 3.1 mmol/L — ABNORMAL LOW (ref 3.5–5.1)
Sodium: 138 mmol/L (ref 135–145)
Total Bilirubin: 1.5 mg/dL — ABNORMAL HIGH (ref 0.3–1.2)
Total Protein: 7.6 g/dL (ref 6.5–8.1)

## 2022-05-26 LAB — TYPE AND SCREEN
ABO/RH(D): O POS
Antibody Screen: NEGATIVE
Unit division: 0
Unit division: 0
Unit division: 0

## 2022-05-26 LAB — BPAM RBC
Blood Product Expiration Date: 202311142359
Blood Product Expiration Date: 202311142359
Blood Product Expiration Date: 202311152359
ISSUE DATE / TIME: 202310111159
ISSUE DATE / TIME: 202310111538
ISSUE DATE / TIME: 202310112031
Unit Type and Rh: 5100
Unit Type and Rh: 5100
Unit Type and Rh: 5100

## 2022-05-26 LAB — SURGICAL PCR SCREEN
MRSA, PCR: NEGATIVE
Staphylococcus aureus: NEGATIVE

## 2022-05-26 LAB — GLUCOSE, CAPILLARY
Glucose-Capillary: 173 mg/dL — ABNORMAL HIGH (ref 70–99)
Glucose-Capillary: 209 mg/dL — ABNORMAL HIGH (ref 70–99)
Glucose-Capillary: 207 mg/dL — ABNORMAL HIGH (ref 70–99)

## 2022-05-26 SURGERY — OPEN REDUCTION INTERNAL FIXATION (ORIF) DISTAL FEMUR FRACTURE
Anesthesia: General | Site: Leg Upper | Laterality: Bilateral

## 2022-05-26 MED ORDER — ACETAMINOPHEN 325 MG PO TABS: 650.0000 mg | ORAL_TABLET | Freq: Once | ORAL | Status: AC

## 2022-05-26 MED ORDER — EPHEDRINE SULFATE-NACL 50-0.9 MG/10ML-% IV SOSY
PREFILLED_SYRINGE | INTRAVENOUS | Status: DC | PRN
  Administered 2022-05-26: 10 mg via INTRAVENOUS

## 2022-05-26 MED ORDER — BRIMONIDINE TARTRATE 0.2 % OP SOLN
1.0000 [drp] | Freq: Three times a day (TID) | OPHTHALMIC | Status: DC
  Administered 2022-05-27 – 2022-05-29 (×9): 1 [drp] via OPHTHALMIC
  Filled 2022-05-26 (×2): qty 5

## 2022-05-26 MED ORDER — ORAL CARE MOUTH RINSE: 15.0000 mL | Freq: Once | OROMUCOSAL | Status: AC

## 2022-05-26 MED ORDER — ACETAMINOPHEN 160 MG/5ML PO SOLN
ORAL | Status: AC
  Filled 2022-05-26: qty 20.3

## 2022-05-26 MED ORDER — BISACODYL 5 MG PO TBEC: 5.0000 mg | DELAYED_RELEASE_TABLET | Freq: Every day | ORAL | Status: DC | PRN

## 2022-05-26 MED ORDER — INSULIN ASPART 100 UNIT/ML IJ SOLN
0.0000 [IU] | Freq: Three times a day (TID) | INTRAMUSCULAR | Status: DC
  Administered 2022-05-27 (×2): 2 [IU] via SUBCUTANEOUS
  Administered 2022-05-27: 3 [IU] via SUBCUTANEOUS
  Administered 2022-05-28: 1 [IU] via SUBCUTANEOUS

## 2022-05-26 MED ORDER — OXYCODONE HCL 5 MG PO TABS: 5.0000 mg | ORAL_TABLET | Freq: Once | ORAL | Status: DC | PRN

## 2022-05-26 MED ORDER — ONDANSETRON HCL 4 MG/2ML IJ SOLN
INTRAMUSCULAR | Status: DC | PRN
  Administered 2022-05-26: 4 mg via INTRAVENOUS

## 2022-05-26 MED ORDER — ONDANSETRON HCL 4 MG/2ML IJ SOLN: 4.0000 mg | Freq: Once | INTRAMUSCULAR | Status: DC | PRN

## 2022-05-26 MED ORDER — FENTANYL CITRATE (PF) 250 MCG/5ML IJ SOLN
INTRAMUSCULAR | Status: AC
  Filled 2022-05-26: qty 5

## 2022-05-26 MED ORDER — ACETAMINOPHEN 325 MG PO TABS
ORAL_TABLET | ORAL | Status: AC
  Administered 2022-05-26: 650 mg via ORAL
  Filled 2022-05-26: qty 2

## 2022-05-26 MED ORDER — PROPOFOL 10 MG/ML IV BOLUS
INTRAVENOUS | Status: DC | PRN
  Administered 2022-05-26: 100 mg via INTRAVENOUS
  Administered 2022-05-26: 30 mg via INTRAVENOUS

## 2022-05-26 MED ORDER — SUGAMMADEX SODIUM 200 MG/2ML IV SOLN
INTRAVENOUS | Status: DC | PRN
  Administered 2022-05-26: 200 mg via INTRAVENOUS

## 2022-05-26 MED ORDER — 0.9 % SODIUM CHLORIDE (POUR BTL) OPTIME
TOPICAL | Status: DC | PRN
  Administered 2022-05-26: 1000 mL

## 2022-05-26 MED ORDER — OXYCODONE HCL 5 MG/5ML PO SOLN: 5.0000 mg | Freq: Once | ORAL | Status: DC | PRN

## 2022-05-26 MED ORDER — PROPOFOL 10 MG/ML IV BOLUS
INTRAVENOUS | Status: AC
  Filled 2022-05-26: qty 20

## 2022-05-26 MED ORDER — POLYETHYLENE GLYCOL 3350 17 G PO PACK: 17.0000 g | PACK | Freq: Every day | ORAL | Status: DC | PRN

## 2022-05-26 MED ORDER — CEFAZOLIN SODIUM-DEXTROSE 2-3 GM-%(50ML) IV SOLR
INTRAVENOUS | Status: DC | PRN
  Administered 2022-05-26: 2 g via INTRAVENOUS

## 2022-05-26 MED ORDER — ENOXAPARIN SODIUM 30 MG/0.3ML IJ SOSY
30.0000 mg | PREFILLED_SYRINGE | INTRAMUSCULAR | Status: DC
  Administered 2022-05-27: 30 mg via SUBCUTANEOUS
  Filled 2022-05-26: qty 0.3

## 2022-05-26 MED ORDER — METOPROLOL TARTRATE 12.5 MG HALF TABLET
12.5000 mg | ORAL_TABLET | Freq: Every day | ORAL | Status: DC
  Administered 2022-05-27 – 2022-06-02 (×7): 12.5 mg via ORAL
  Filled 2022-05-26 (×7): qty 1

## 2022-05-26 MED ORDER — CHLORHEXIDINE GLUCONATE 0.12 % MT SOLN
OROMUCOSAL | Status: AC
  Administered 2022-05-26: 15 mL via OROMUCOSAL
  Filled 2022-05-26: qty 15

## 2022-05-26 MED ORDER — FENTANYL CITRATE (PF) 250 MCG/5ML IJ SOLN
INTRAMUSCULAR | Status: DC | PRN
  Administered 2022-05-26 (×3): 50 ug via INTRAVENOUS

## 2022-05-26 MED ORDER — MUPIROCIN 2 % EX OINT
1.0000 | TOPICAL_OINTMENT | Freq: Two times a day (BID) | CUTANEOUS | Status: AC
  Administered 2022-05-27 – 2022-05-28 (×3): 1 via NASAL
  Filled 2022-05-26 (×3): qty 22

## 2022-05-26 MED ORDER — FENTANYL CITRATE (PF) 100 MCG/2ML IJ SOLN: 25.0000 ug | INTRAMUSCULAR | Status: DC | PRN

## 2022-05-26 MED ORDER — ASPIRIN 81 MG PO TBEC
81.0000 mg | DELAYED_RELEASE_TABLET | Freq: Every day | ORAL | Status: DC
  Administered 2022-05-27 – 2022-06-02 (×7): 81 mg via ORAL
  Filled 2022-05-26 (×7): qty 1

## 2022-05-26 MED ORDER — LACTATED RINGERS IV SOLN: INTRAVENOUS | Status: DC

## 2022-05-26 MED ORDER — METHOCARBAMOL 500 MG PO TABS
500.0000 mg | ORAL_TABLET | Freq: Four times a day (QID) | ORAL | Status: DC | PRN
  Administered 2022-05-26 – 2022-05-31 (×8): 500 mg via ORAL
  Filled 2022-05-26 (×8): qty 1

## 2022-05-26 MED ORDER — CHLORHEXIDINE GLUCONATE 0.12 % MT SOLN: 15.0000 mL | Freq: Once | OROMUCOSAL | Status: AC

## 2022-05-26 MED ORDER — CEFAZOLIN SODIUM-DEXTROSE 2-4 GM/100ML-% IV SOLN
2.0000 g | INTRAVENOUS | Status: AC
  Filled 2022-05-26: qty 100

## 2022-05-26 MED ORDER — PHENYLEPHRINE HCL-NACL 20-0.9 MG/250ML-% IV SOLN
INTRAVENOUS | Status: DC | PRN
  Administered 2022-05-26: 25 ug/min via INTRAVENOUS

## 2022-05-26 MED ORDER — ONDANSETRON HCL 4 MG/2ML IJ SOLN: 4.0000 mg | Freq: Four times a day (QID) | INTRAMUSCULAR | Status: DC | PRN

## 2022-05-26 MED ORDER — OXYCODONE HCL 5 MG PO TABS
5.0000 mg | ORAL_TABLET | ORAL | Status: DC | PRN
  Administered 2022-05-27 – 2022-06-02 (×9): 5 mg via ORAL
  Filled 2022-05-26 (×10): qty 1

## 2022-05-26 MED ORDER — METHOCARBAMOL 1000 MG/10ML IJ SOLN
500.0000 mg | Freq: Four times a day (QID) | INTRAVENOUS | Status: DC | PRN
  Filled 2022-05-26: qty 5

## 2022-05-26 MED ORDER — LIDOCAINE 2% (20 MG/ML) 5 ML SYRINGE
INTRAMUSCULAR | Status: DC | PRN
  Administered 2022-05-26: 50 mg via INTRAVENOUS

## 2022-05-26 MED ORDER — PROPOFOL 500 MG/50ML IV EMUL
INTRAVENOUS | Status: DC | PRN
  Administered 2022-05-26: 125 ug/kg/min via INTRAVENOUS

## 2022-05-26 MED ORDER — POTASSIUM CHLORIDE CRYS ER 20 MEQ PO TBCR
40.0000 meq | EXTENDED_RELEASE_TABLET | Freq: Once | ORAL | Status: AC
  Administered 2022-05-26: 40 meq via ORAL
  Filled 2022-05-26 (×2): qty 2

## 2022-05-26 MED ORDER — DOCUSATE SODIUM 100 MG PO CAPS
100.0000 mg | ORAL_CAPSULE | Freq: Two times a day (BID) | ORAL | Status: DC
  Administered 2022-05-26 – 2022-06-01 (×12): 100 mg via ORAL
  Filled 2022-05-26 (×13): qty 1

## 2022-05-26 MED ORDER — MORPHINE SULFATE (PF) 2 MG/ML IV SOLN
2.0000 mg | INTRAVENOUS | Status: DC | PRN
  Administered 2022-05-27 – 2022-05-31 (×3): 2 mg via INTRAVENOUS
  Filled 2022-05-26 (×3): qty 1

## 2022-05-26 MED ORDER — ROCURONIUM BROMIDE 10 MG/ML (PF) SYRINGE
PREFILLED_SYRINGE | INTRAVENOUS | Status: DC | PRN
  Administered 2022-05-26: 50 mg via INTRAVENOUS

## 2022-05-26 MED ORDER — CEFAZOLIN SODIUM-DEXTROSE 1-4 GM/50ML-% IV SOLN
1.0000 g | Freq: Two times a day (BID) | INTRAVENOUS | Status: AC
  Administered 2022-05-27 (×2): 1 g via INTRAVENOUS
  Filled 2022-05-26 (×2): qty 50

## 2022-05-26 MED ORDER — PHENYLEPHRINE HCL (PRESSORS) 10 MG/ML IV SOLN
INTRAVENOUS | Status: DC | PRN
  Administered 2022-05-26: 80 ug via INTRAVENOUS

## 2022-05-26 MED ORDER — ACETAMINOPHEN 325 MG PO TABS
650.0000 mg | ORAL_TABLET | Freq: Three times a day (TID) | ORAL | Status: DC
  Administered 2022-05-26 – 2022-06-02 (×20): 650 mg via ORAL
  Filled 2022-05-26 (×20): qty 2

## 2022-05-26 MED ORDER — BRINZOLAMIDE 1 % OP SUSP
1.0000 [drp] | Freq: Three times a day (TID) | OPHTHALMIC | Status: DC
  Administered 2022-05-27 – 2022-05-29 (×9): 1 [drp] via OPHTHALMIC
  Filled 2022-05-26 (×2): qty 10

## 2022-05-26 MED ORDER — TIMOLOL MALEATE 0.5 % OP SOLN
1.0000 [drp] | Freq: Two times a day (BID) | OPHTHALMIC | Status: DC
  Administered 2022-05-27 – 2022-06-02 (×14): 1 [drp] via OPHTHALMIC
  Filled 2022-05-26 (×2): qty 5

## 2022-05-26 SURGICAL SUPPLY — 85 items
BAG COUNTER SPONGE SURGICOUNT (BAG) ×1 IMPLANT
BAG SPNG CNTER NS LX DISP (BAG) ×1
BIT DRILL 4.3 (BIT) ×2
BIT DRILL 4.3X300MM (BIT) IMPLANT
BIT DRILL LONG 3.3 (BIT) IMPLANT
BIT DRILL QC 3.3X195 (BIT) IMPLANT
BLADE CLIPPER SURG (BLADE) IMPLANT
BNDG ELASTIC 4X5.8 VLCR STR LF (GAUZE/BANDAGES/DRESSINGS) ×1 IMPLANT
BNDG ELASTIC 6X5.8 VLCR STR LF (GAUZE/BANDAGES/DRESSINGS) ×1 IMPLANT
BNDG GAUZE DERMACEA FLUFF 4 (GAUZE/BANDAGES/DRESSINGS) ×1 IMPLANT
BNDG GZE DERMACEA 4 6PLY (GAUZE/BANDAGES/DRESSINGS)
BRUSH SCRUB EZ PLAIN DRY (MISCELLANEOUS) ×2 IMPLANT
CANISTER SUCT 3000ML PPV (MISCELLANEOUS) ×1 IMPLANT
CAP LOCK NCB (Cap) IMPLANT
COVER SURGICAL LIGHT HANDLE (MISCELLANEOUS) ×1 IMPLANT
DRAPE C-ARM 42X72 X-RAY (DRAPES) ×1 IMPLANT
DRAPE C-ARMOR (DRAPES) ×1 IMPLANT
DRAPE IMP U-DRAPE 54X76 (DRAPES) ×1 IMPLANT
DRAPE ORTHO SPLIT 77X108 STRL (DRAPES)
DRAPE SURG ORHT 6 SPLT 77X108 (DRAPES) ×3 IMPLANT
DRAPE U-SHAPE 47X51 STRL (DRAPES) ×1 IMPLANT
DRSG ADAPTIC 3X8 NADH LF (GAUZE/BANDAGES/DRESSINGS) ×1 IMPLANT
DRSG AQUACEL AG ADV 3.5X 4 (GAUZE/BANDAGES/DRESSINGS) IMPLANT
DRSG MEPILEX POST OP 4X12 (GAUZE/BANDAGES/DRESSINGS) IMPLANT
DRSG MEPILEX POST OP 4X8 (GAUZE/BANDAGES/DRESSINGS) IMPLANT
ELECT REM PT RETURN 9FT ADLT (ELECTROSURGICAL) ×1
ELECTRODE REM PT RTRN 9FT ADLT (ELECTROSURGICAL) ×1 IMPLANT
EVACUATOR 1/8 PVC DRAIN (DRAIN) IMPLANT
EVACUATOR 3/16  PVC DRAIN (DRAIN)
EVACUATOR 3/16 PVC DRAIN (DRAIN) IMPLANT
GAUZE PAD ABD 8X10 STRL (GAUZE/BANDAGES/DRESSINGS) ×4 IMPLANT
GAUZE SPONGE 4X4 12PLY STRL (GAUZE/BANDAGES/DRESSINGS) ×1 IMPLANT
GLOVE BIO SURGEON STRL SZ7.5 (GLOVE) ×1 IMPLANT
GLOVE BIO SURGEON STRL SZ8 (GLOVE) ×1 IMPLANT
GLOVE BIOGEL PI IND STRL 7.5 (GLOVE) ×1 IMPLANT
GLOVE BIOGEL PI IND STRL 8 (GLOVE) ×1 IMPLANT
GLOVE SURG ORTHO LTX SZ7.5 (GLOVE) ×2 IMPLANT
GOWN STRL REUS W/ TWL LRG LVL3 (GOWN DISPOSABLE) ×2 IMPLANT
GOWN STRL REUS W/ TWL XL LVL3 (GOWN DISPOSABLE) ×1 IMPLANT
GOWN STRL REUS W/TWL LRG LVL3 (GOWN DISPOSABLE) ×2
GOWN STRL REUS W/TWL XL LVL3 (GOWN DISPOSABLE) ×1
K-WIRE 2.0 (WIRE) ×3
K-WIRE FXSTD 280X2XNS SS (WIRE) ×3
KIT BASIN OR (CUSTOM PROCEDURE TRAY) ×1 IMPLANT
KIT TURNOVER KIT B (KITS) ×1 IMPLANT
KWIRE FXSTD 280X2XNS SS (WIRE) IMPLANT
NDL 22X1.5 STRL (OR ONLY) (MISCELLANEOUS) IMPLANT
NEEDLE 22X1.5 STRL (OR ONLY) (MISCELLANEOUS) IMPLANT
NS IRRIG 1000ML POUR BTL (IV SOLUTION) ×1 IMPLANT
PACK TOTAL JOINT (CUSTOM PROCEDURE TRAY) ×1 IMPLANT
PACK UNIVERSAL I (CUSTOM PROCEDURE TRAY) ×1 IMPLANT
PAD ARMBOARD 7.5X6 YLW CONV (MISCELLANEOUS) ×2 IMPLANT
PAD CAST 4YDX4 CTTN HI CHSV (CAST SUPPLIES) ×1 IMPLANT
PADDING CAST COTTON 4X4 STRL (CAST SUPPLIES)
PADDING CAST COTTON 6X4 STRL (CAST SUPPLIES) ×1 IMPLANT
PLATE BONE LOCK 238MM 9HOLE (Plate) IMPLANT
PLATE DIST FEM 12H (Plate) IMPLANT
SCREW 5.0 70MM (Screw) IMPLANT
SCREW 5.0 80MM (Screw) IMPLANT
SCREW CORT NCB SELFTAP 5.0X42 (Screw) IMPLANT
SCREW CORT NCB SELFTAP 5.0X50 (Screw) IMPLANT
SCREW CORTICAL 5.0X10MM (Screw) IMPLANT
SCREW NCB 3.5X75X5X6.2XST (Screw) IMPLANT
SCREW NCB 4.0MX34M (Screw) IMPLANT
SCREW NCB 4.0X36MM (Screw) IMPLANT
SCREW NCB 5.0X36MM (Screw) IMPLANT
SCREW NCB 5.0X75MM (Screw) ×4 IMPLANT
SPONGE T-LAP 18X18 ~~LOC~~+RFID (SPONGE) ×1 IMPLANT
STAPLER VISISTAT 35W (STAPLE) ×1 IMPLANT
STOCKINETTE IMPERVIOUS 9X36 MD (GAUZE/BANDAGES/DRESSINGS) IMPLANT
SUCTION FRAZIER HANDLE 10FR (MISCELLANEOUS)
SUCTION TUBE FRAZIER 10FR DISP (MISCELLANEOUS) ×1 IMPLANT
SUT ETHILON 2 0 FS 18 (SUTURE) IMPLANT
SUT PROLENE 0 CT 2 (SUTURE) IMPLANT
SUT VIC AB 0 CT1 27 (SUTURE) ×2
SUT VIC AB 0 CT1 27XBRD ANBCTR (SUTURE) ×2 IMPLANT
SUT VIC AB 1 CT1 27 (SUTURE) ×2
SUT VIC AB 1 CT1 27XBRD ANBCTR (SUTURE) ×2 IMPLANT
SUT VIC AB 2-0 CT1 27 (SUTURE) ×2
SUT VIC AB 2-0 CT1 TAPERPNT 27 (SUTURE) ×2 IMPLANT
SYR 20ML ECCENTRIC (SYRINGE) IMPLANT
TOWEL GREEN STERILE (TOWEL DISPOSABLE) ×2 IMPLANT
TOWEL GREEN STERILE FF (TOWEL DISPOSABLE) ×1 IMPLANT
TRAY FOLEY MTR SLVR 16FR STAT (SET/KITS/TRAYS/PACK) IMPLANT
WATER STERILE IRR 1000ML POUR (IV SOLUTION) ×2 IMPLANT

## 2022-05-26 NOTE — ED Provider Notes (Addendum)
6:10 AM  Pt still awaiting bed at [facility] for surgical intervention for comminuted distal femur fracture.  Currently in a splint.  Leg is elevated.  Pain is well controlled.  Compartments are still soft and she has warm and well-perfused distal lower extremity with normal movement of her toes and a strong 2+ DP pulse on my exam.  She reports normal sensation in the leg.  Family reports that patient is supposed to be going to [facility], OR 3 at noon today with Dr. Marcelino Scot.  Discussed this with our secretary to make sure that appropriate transportation is arranged.  She still does not have a bed available at [facility] at this time and unfortunately the ED at [facility] has a significant amount of admission holds in a large volume in the waiting room and given patient is stable, I do not feel she meets criteria for ED to ED transfer at this time.  We will continue to keep her n.p.o.   Jujuan Dugo, Delice Bison, DO 05/26/22 0620   7:20 AM  Discussed with our secretary who discussed with [facility].  They state if patient does not have a bed by 10 AM they will arrange transport for her to go directly to the operating room.  She has received packed red blood cells and platelets for her anemia, thrombocytopenia.  Repeat blood work has been ordered for this morning.   Shera Laubach, Delice Bison, DO 05/26/22 [phone]

## 2022-05-26 NOTE — Anesthesia Procedure Notes (Signed)
Procedure Name: Intubation Date/Time: 05/26/2022 5:20 PM  Performed by: Minerva Ends, CRNAPre-anesthesia Checklist: Patient identified, Emergency Drugs available, Suction available and Patient being monitored Patient Re-evaluated:Patient Re-evaluated prior to induction Oxygen Delivery Method: Circle system utilized Preoxygenation: Pre-oxygenation with 100% oxygen Induction Type: IV induction Ventilation: Mask ventilation without difficulty Laryngoscope Size: Mac and 3 Grade View: Grade I Tube type: Oral Tube size: 7.0 mm Number of attempts: 1 Airway Equipment and Method: Stylet and Oral airway Placement Confirmation: ETT inserted through vocal cords under direct vision, positive ETCO2 and breath sounds checked- equal and bilateral Secured at: 21 cm Tube secured with: Tape Dental Injury: Teeth and Oropharynx as per pre-operative assessment

## 2022-05-26 NOTE — ED Notes (Signed)
Called [facility] spoke to tara/ stated patient should be picked up by 10 am if truck is available.due to patient having surg call [facility] back to confirm about tranfere

## 2022-05-26 NOTE — Anesthesia Postprocedure Evaluation (Signed)
Anesthesia Post Note  [name]  Procedure(s) Performed: OPEN REDUCTION INTERNAL FIXATION (ORIF) DISTAL FEMUR FRACTURE (Bilateral: Leg Upper)     Patient location during evaluation: PACU Anesthesia Type: General Level of consciousness: awake and alert Pain management: pain level controlled Vital Signs Assessment: post-procedure vital signs reviewed and stable Respiratory status: spontaneous breathing, nonlabored ventilation, respiratory function stable and patient connected to nasal cannula oxygen Cardiovascular status: blood pressure returned to baseline and stable Postop Assessment: no apparent nausea or vomiting Anesthetic complications: no   No notable events documented.  Last Vitals:  Vitals:   05/26/22 2115 05/26/22 2139  BP: 127/71 128/63  Pulse: 88 85  Resp: 20 16  Temp:  (!) 36.4 C  SpO2: 97% 97%    Last Pain:  Vitals:   05/26/22 2139  TempSrc: Oral  PainSc:                  Effie Berkshire

## 2022-05-26 NOTE — Op Note (Signed)
NAME: Gabriela [name] RECORD LZ:[id number] DATE OF BIRTH:10-18-1925 PHYSICIAN: Rozanna Box, MD  OPERATIVE REPORT  DATE OF PROCEDURE:  05/26/2022  PREOPERATIVE DIAGNOSIS:   RIGHT SUPRACONDYLAR FEMUR FRACTURE BELOW TOTAL HIP ARTHROPLASTY. LEFT SUPRACONDYLAR FEMUR FRACTURE WITH INTRA-ARTICULAR EXTENSION.  POSTOPERATIVE DIAGNOSIS:   RIGHT SUPRACONDYLAR FEMUR FRACTURE BELOW TOTAL HIP ARTHROPLASTY. LEFT SUPRACONDYLAR FEMUR FRACTURE WITH INTRA-ARTICULAR EXTENSION.  PROCEDURE:   1. OPEN REDUCTION INTERNAL FIXATION OF RIGHT SUPRACONDYLAR FEMUR FRACTURE OVERLAPPING WITH TOTAL HIP FEMORAL STEM using a Biomet NCB plate. 2. OPEN REDUCTION INTERNAL FIXATION OF LEFT SUPRACONDYLAR FEMUR FRACTURE WITH INTRA-ARTICULAR EXTENSION using a Biomet NCB plate.  SURGEON:  Altamese Hamlin, MD  ASSISTANT: Ainsley Spinner, PA-C.  ANESTHESIA:  General.  ESTIMATED BLOOD LOSS:  130 mL.  DRAINS:  None.  SPECIMENS:  None.  DISPOSITION OF SPECIMENS:  None applicable.  COUNTS:  Yes.  TOURNIQUET:  None.  DISPOSITION:  To PACU.  CONDITION:  Stable.    COMPLICATIONS:  None.  DELAY START OF PHARMACOLOGIC VTE AGENT DUE TO SURGICAL BLOOD LOSS OR RISK OF BLEEDING:  No  SUMMARY FOR PROCEDURE:  The patient is 88 y.o. who lives independently at home, uses a cane or walker, who sustained injury in ground level fall, resulting in a comminuted fracture of the supracondylar region of both femurs. It was found to have an intra-articular component on the left. We discussed with patient and her granddaughters the risks and benefits of surgical repair, including the possibility of nerve injury, vessel injury, infection, malunion, nonunion, symptomatic hardware, heart attack, stroke, DVT, PE, loss of motion, arthritis and multiple others. We also specifically spoke of the elevated risk of infection related to chronic draining wound of the right anterior thigh. After acknowledging these risks, consent was provided to  proceed.  BRIEF SUMMARY OF PROCEDURE:  The patient was taken to the operating room after administration of preoperative antibiotics.  Both lower extremities were prepped and draped in the usual sterile fashion using traction to protect the neurovascular bundles  and being careful with the limb throughout.  Chlorhexidine wash then Betadine scrub and paint were performed.  Timeout was held.    We brought in the C-arm to identify the correct starting point as well as the length of the plate on the right.  A lateral approach to the distal femur was then made, carrying dissection down through the IT band, preserving the supracondylar blood supply, and then passing the plate retrograde along the femoral shaft, bringing in the C-arm to confirm the appropriate position distally on AP and lateral views and proximally so that we could overlap the stem sufficiently and if feasible to obtain bicortical screw fixation around the hip stem.  We used towel bumps and a radiolucent triangle to dial in the reduction on the lateral view, as well as the supracondylar alignment and translation on the AP view.  After I had the correct rotation on the distal fragment on the lateral view, 2 screws were placed through the plate to appose it to the bone.  We then followed this by a 3.3 drill bit into the proximal shaft segment.   We continued to reduce translation with sequential tightening. One of the screws used to achieve this was too long once the plate was fully in position. During extraction it stripped and was not able to be withdrawn without making a small medial incision. It was removed with further difficulty. Ultimately, we were able to achieve solid fixation both distally with 5.0 mm screws and locking  caps and proximally with a mixture of 4 mm and 5 mm screws, including some unicortical ones overlapping the hip stem. Final images showed appropriate reduction and implant position.  We then turned attention to the left  supracondylar femur. With the leg on a radiolucent triangle the C-arm was brought in to mark the starting point on AP and lateral images distally, and the distal incision made.  Dissection was carried carefully down to the retinaculum, which was incised and divided to accommodate plate placement.  Using bumps and traction we were able to secure an excellent reduction on the AP and lateral views. Appropriate length Biomet NCB plate was then inserted and positioned with the proximal portion on the jig centered on the shaft.  The intra-articular component remained well reduced. The supracondylar component of the fracture was also confirmed in a reduced position.  Once we were satisfied with position on AP and lateral images, three pins were placed distally and then a drill bit in a proximal shaft hole. The distal block was then secured with screws placed laterally through the plate. We were careful to watch for rotation and translation. We also brought the knee into full extension to checked rotation and alignment. We secured four bicortical screws proximally and multiple screws into the supracondylar region distally with the addition of locking caps.  Wounds were irrigated thoroughly.  Final C-arm images confirmed appropriate reduction, hardware placement, trajectory and length.  I then evaluated both knee joints by applying manual stress under  fluoroscopy, which showed no ligamentous instability.  All wounds were irrigated thoroughly and then closed in standard layered fashion using #1 Vicryl, #0 Vicryl, 2-0 Vicryl, and 2-0 nylon.  A gently compressive dressing was applied from foot to thigh and then knee immobilizer. The patient was taken to PACU in stable condition.  Ainsley Spinner, PA-C, assisted me throughout and required to effectively produce, control, and maintain the reduction during provisional and definitive internal fixation. He also assisted with wound closures. The patient awakened from anesthesia and  transported to PACU in stable condition.  PROGNOSIS:  The patient will receive pharmacologic DVT prophylaxis, be weightbearing as tolerated bilaterally with unrestricted range of motion of the knee, and standard joint precautions for the right hip.  We anticipate discharge to a skilled nursing facility or rehab center and follow up in the office in 2 weeks for removal of sutures with new x-rays at that time.

## 2022-05-26 NOTE — Anesthesia Preprocedure Evaluation (Addendum)
Anesthesia Evaluation  Patient identified by MRN, date of birth, ID band Patient awake    Reviewed: Allergy & Precautions, H&P , NPO status , Patient's Chart, lab work & pertinent test results  History of Anesthesia Complications Negative for: history of anesthetic complications  Airway Mallampati: II   Neck ROM: full    Dental   Pulmonary neg pulmonary ROS, Patient abstained from smoking.,    breath sounds clear to auscultation       Cardiovascular hypertension, Pt. on home beta blockers and Pt. on medications + CAD and + CABG  + Valvular Problems/Murmurs (s/p MV repair)  Rhythm:regular Rate:Normal   '14 TTE (preCABG/MV repair) - EF 40% to 45%. Diffuse hypokinesis. Probable moderate hypokinesis of the inferolateral and inferior myocardium. Grade 2 diastolic dysfunction. Moderate central MR. Mildly dilated LA.    Neuro/Psych negative neurological ROS  negative psych ROS   GI/Hepatic negative GI ROS, Neg liver ROS,   Endo/Other  diabetes, Type 2 K 3.1 Ca 7.8   Renal/GU CRFRenal disease     Musculoskeletal  (+) Arthritis ,   Abdominal   Peds  Hematology  MGUS CBC      Component                Value               Date/Time                 WBC                      19.1 (H)            05/26/2022 0648           HGB                      10.3 (L)            05/26/2022 0648           HCT                      31.0 (L)            05/26/2022 0648           PLT                      69 (L)              05/26/2022 0648              Anesthesia Other Findings   Reproductive/Obstetrics                            Anesthesia Physical Anesthesia Plan  ASA: 3  Anesthesia Plan: General   Post-op Pain Management: Tylenol PO (pre-op)*   Induction: Intravenous  PONV Risk Score and Plan: 3 and Treatment may vary due to age or medical condition, Ondansetron and TIVA  Airway Management Planned: Oral  ETT  Additional Equipment: None  Intra-op Plan:   Post-operative Plan: Extubation in OR  Informed Consent: I have reviewed the patients History and Physical, chart, labs and discussed the procedure including the risks, benefits and alternatives for the proposed anesthesia with the patient or authorized representative who has indicated his/her understanding and acceptance.     Dental advisory given  Plan Discussed with: CRNA, Anesthesiologist and Surgeon  Anesthesia Plan Comments:  Anesthesia Quick Evaluation  

## 2022-05-26 NOTE — ED Notes (Signed)
Called to [facility]/Rep Edwin Dada stated truck is in route to get patient.Marland Kitchen

## 2022-05-26 NOTE — Progress Notes (Signed)
Orthopedic Tech Progress Note Patient Details:  RAGUEL KOSLOSKI 1926-03-30 [id number]  Overhead frame with a trapeze bar is not permitted due to limitations for use, pt age >/= 88 y.o.  Patient ID: REMONA BOOM, female   DOB: 1925/08/24, 88 y.o.   MRN: [id number]  Carin Primrose 05/26/2022, 4:17 PM

## 2022-05-26 NOTE — H&P (Signed)
History and Physical    [name] MIW:[id number] DOB: July 16, 1926 DOA: 05/26/2022 DOS: the patient was seen and examined on 05/26/2022 PCP: [name], MD  Patient coming from: Home - lives alone; NOK:  Granddaughter, [phone]   Chief Complaint: Fall  HPI: Gabriela [name] is a 88 y.o. female with medical history significant of DM; stage 4 CKD; and pancytopenia presenting with a fall.  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  She was treated with Cefepime -> Cipro and discharged to home with home therapy services.  She has been doing alright and was getting around well.  She was doing PT at home Tuesday and her left leg (her "good leg) gave out on her.  She fell onto the concrete floor.  She was standing in front of the walker and moving her knees and the left leg gave out.  She fell straight ahead and landed on her knees.  The left knee now hurts more than the right.  She is having left > right leg pain even just lying still.  She is still taking Cipro for suppressive therapy, 8-9 pills left; the doctor at [facility] switched her antibiotic.  ID has followed her since 2016 for recurrent infections, Dr. Delaine Lame.  Her blood sugar has been stable, has not needed insulin in 6 weeks, was only using SSI.      ER Course:  [facility] to [facility] transfer, per Dr. Tamala Julian:  Fall while undergoing rehab at [facility] for prior right hip replacement.  Patient found to have a communicated fracture of the distal femoral metaphysis of the right leg.  Dr. Marcelino Scot orthopedics consulted at [facility] due to the complexity of case.  Of note patient's hemoglobin was proximately 1 g lower than prior at 7.3 and platelet count 57.  No reports of bleeding.       Review of Systems: As mentioned in the history of present illness. All other systems reviewed and are negative. Past Medical History:  Diagnosis Date    Arthritis    Diabetes mellitus ([place])    Glaucoma    Hyperlipidemia    Hypertension    Shortness of breath    with exertion   Past Surgical History:  Procedure Laterality Date   CARDIAC CATHETERIZATION  04/22/13   CHOLECYSTECTOMY     CORONARY ARTERY BYPASS GRAFT N/A 05/31/2013   Procedure: Coronary Artery Bypass Grafting Times Three Using Left Internal Mammary Artery and Right Saphenous Leg Vein Harvested Endoscopically;  Surgeon: Ivin Poot, MD;  Location: [place];  Service: Open Heart Surgery;  Laterality: N/A;   ERCP N/A 10/20/2020   Procedure: ENDOSCOPIC RETROGRADE CHOLANGIOPANCREATOGRAPHY (ERCP);  Surgeon: Lucilla Lame, MD;  Location: [facility] ENDOSCOPY;  Service: Endoscopy;  Laterality: N/A;   ERCP N/A 12/22/2020   Procedure: ENDOSCOPIC RETROGRADE CHOLANGIOPANCREATOGRAPHY (ERCP);  Surgeon: Lucilla Lame, MD;  Location: [facility] ENDOSCOPY;  Service: Endoscopy;  Laterality: N/A;   EYE SURGERY     HIP SURGERY     right   INTRAOPERATIVE TRANSESOPHAGEAL ECHOCARDIOGRAM N/A 05/31/2013   Procedure: INTRAOPERATIVE TRANSESOPHAGEAL ECHOCARDIOGRAM;  Surgeon: Ivin Poot, MD;  Location: [place];  Service: Open Heart Surgery;  Laterality: N/A;   JOINT REPLACEMENT     MITRAL VALVE REPAIR N/A 05/31/2013   Procedure: MITRAL VALVE REPAIR (MVR);  Surgeon: Ivin Poot, MD;  Location: [place];  Service: Open Heart Surgery;  Laterality: N/A;   TONSILLECTOMY  Social History:  reports that she has never smoked. She has never used smokeless tobacco. She reports that she does not drink alcohol and does not use drugs.  Allergies  Allergen Reactions   Sulfa Antibiotics Rash    Family History  Problem Relation Age of Onset   Diabetes Sister    Hypertension Mother     Prior to Admission medications   Medication Sig Start Date End Date Taking? Authorizing Provider  Accu-Chek FastClix Lancets MISC To check blood sugars once a day. DX E11.9 03/09/21   Jerrol Banana., MD  albuterol (VENTOLIN HFA)  108 (90 Base) MCG/ACT inhaler INHALE 1-2 PUFFS INTO THE LUNGS EVERY 4 HOURS AS NEEDED FOR WHEEZING OR SHORTNESS OF BREATH. 10/12/21   Jerrol Banana., MD  aspirin EC 81 MG tablet Take 81 mg by mouth daily.    [provider]  B-D UF III MINI PEN NEEDLES 31G X 5 MM MISC USE WITH INSULIN DOSES Patient not taking: Reported on 04/28/2022 09/04/21   Jerrol Banana., MD  Blood Glucose Monitoring Suppl (ACCU-CHEK AVIVA PLUS) w/Device KIT 1 each by Does not apply route daily. Dx E11.9 03/09/21   Jerrol Banana., MD  ciprofloxacin (CIPRO) 500 MG tablet Take 1 tablet (500 mg total) by mouth daily at 6 PM. Patient not taking: Reported on 05/25/2022 04/21/22 06/01/22  Sharen Hones, MD  cyanocobalamin 1000 MCG tablet Take 0.5 tablets (500 mcg total) by mouth daily. 05/12/22 08/10/22  Simmons-Robinson, Makiera, MD  ferrous sulfate 325 (65 FE) MG tablet Take 1 tablet (325 mg total) by mouth daily with breakfast. TAKE 1 TABLET BY MOUTH DAILY. 01/04/22   Jerrol Banana., MD  furosemide (LASIX) 20 MG tablet TAKE 1 TABLET BY MOUTH EVERY DAY 02/12/22   Jerrol Banana., MD  glucose blood (ACCU-CHEK AVIVA PLUS) test strip USE AS INSTRUCTED DX E11.9 11/08/21   Jerrol Banana., MD  metoprolol tartrate (LOPRESSOR) 25 MG tablet TAKE 1/2 TABLET BY MOUTH EVERY DAY 11/17/21   Jerrol Banana., MD  SIMBRINZA 1-0.2 % SUSP Apply 1 drop to eye 2 (two) times daily. 12/07/21   [provider]  timolol (TIMOPTIC) 0.5 % ophthalmic solution Apply to eye. 11/16/21   [provider]    Physical Exam: Vitals:   05/26/22 1027 05/26/22 1134 05/26/22 1447  BP: (!) 115/47 (!) 111/56 129/70  Pulse: 82 75 82  Resp: _0 Temp: 99 F (37.2 C) 99.5 F (37.5 C) 98.3 F (36.8 C)  TempSrc: Oral Oral   SpO2: 99%  96%  Weight:  56 kg   Height:  _1  (1.626 m)    General:  Appears calm and comfortable and is in NAD at rest, grimaces with movement Eyes:   EOMI, normal lids,  iris ENT:  grossly normal hearing, lips & tongue, mmm Neck:  no LAD, masses or thyromegaly Cardiovascular:  RRR, no m/r/g. No LE edema.  Respiratory:   CTA bilaterally with no wheezes/rales/rhonchi.  Normal respiratory effort. Abdomen:  soft, NT, ND Skin:  no rash or induration seen on limited exam Musculoskeletal:  L knee with bony prominence and effusion, extreme pain with movement of L leg; R leg is completely wrapped Psychiatric:  grossly normal mood and affect, speech fluent and appropriate, AOx3 Neurologic:  CN 2-12 grossly intact   Radiological Exams on Admission: Independently reviewed - see discussion in A/P where applicable  CT KNEE LEFT WO CONTRAST  Result Date: 05/26/2022 CLINICAL DATA:  Knee trauma, occult fracture suspected, xray done EXAM: CT OF THE LEFT KNEE WITHOUT CONTRAST TECHNIQUE: Multidetector CT imaging of the left knee was performed according to the standard protocol. Multiplanar CT image reconstructions were also generated. RADIATION DOSE REDUCTION: This exam was performed according to the departmental dose-optimization program which includes automated exposure control, adjustment of the mA and/or kV according to patient size and/or use of iterative reconstruction technique. COMPARISON:  Radiograph 05/25/2022. FINDINGS: Bones/Joint/Cartilage There is a posterior angulated, comminuted distal frontal metaphyseal fracture with a transverse component and a longitudinal component extending to the upper articular surface of the lateral femoral condyle. 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 No evidence of proximal tibia or fibula fracture. No patellar fracture. Small joint effusion. Ligaments Suboptimally assessed by CT. Muscles and Tendons No acute myotendinous abnormality. Soft tissues Soft tissue swelling along the knee.  No focal fluid collection. IMPRESSION: Comminuted distal femur fracture with posterior angulation and intra-articular extension through the  upper aspect of the lateral femoral condyle. Electronically Signed   By: Maurine Simmering M.D.   On: 05/26/2022 14:23   DG FEMUR PORT, MIN 2 VIEWS RIGHT  Result Date: 05/25/2022 CLINICAL DATA:  Preop planning. Right distal femur fracture. Post reduction images. EXAM: RIGHT FEMUR PORTABLE 2 VIEW COMPARISON:  Earlier same day radiograph at 1226 hours FINDINGS: Overlying splint material partially obscures fine osseous detail. Diffuse osteopenia. Unchanged position of the right distal femoral fracture fragments compared to earlier same day radiograph. Persistent suprapatellar joint effusion. Right hip prosthesis is intact and in appropriate position. No definite periprosthetic fracture. Unchanged osteolysis at the acetabular cup as seen on recent CT. IMPRESSION: Unchanged position of the right distal femoral fracture fragments compared to earlier same day radiograph. Electronically Signed   By: Ileana Roup M.D.   On: 05/25/2022 14:56   DG Knee 2 Views Right  Result Date: 05/25/2022 CLINICAL DATA:  Postreduction EXAM: RIGHT KNEE - 1-2 VIEW COMPARISON:  Knee radiographs 1 day prior. FINDINGS: Again seen is a comminuted fracture of the distal femur with posterior angulation of the distal fragment. Alignment in the frontal projection is similar to the prior study with slight medial apex angulation. Alignment is improved in the lateral projection compared to the study from 1 day prior. Femoroacetabular alignment appears maintained. There is surrounding soft tissue swelling with a suprapatellar effusion. IMPRESSION: Improved alignment of the comminuted distal femoral fracture following reduction. Electronically Signed   By: Valetta Mole M.D.   On: 05/25/2022 12:37   DG Femur Min 2 Views Left  Result Date: 05/24/2022 CLINICAL DATA:  Left femur pain. Fall. EXAM: LEFT FEMUR 2 VIEWS COMPARISON:  None Available. FINDINGS: The bones are diffusely under mineralized. There is no evidence of fracture or other focal bone  lesions. Degenerative change of the hip and knee. There are prominent vascular calcifications. Soft tissues are atrophic. IMPRESSION: 1. No fracture of the left femur. 2. Osteopenia/osteoporosis with degenerative change of the hip and knee. Electronically Signed   By: Keith Rake M.D.   On: 05/24/2022 20:19    EKG: not done   Labs on Admission: I have personally reviewed the available labs and imaging studies at the time of the admission.  Pertinent labs:    K+ 3.1 Glucose 243 BUN 42/Creatinine 1.54/GFR 31 Albumin 3.0 WBC 19.1 Hgb 10.3 - s/p 3 units PRBC MRSA PCR negative   Assessment and Plan: Principal Problem:   Femur fracture, right (HCC) Active Problems:  Non-healing non-surgical wound   Type 2 diabetes mellitus with renal manifestations (HCC)   Thrombocytopenia (HCC)   CAD (coronary artery disease)   Hyperlipidemia   Glaucoma   MGUS (monoclonal gammopathy of unknown significance)   Femur fracture, left (HCC)   CKD (chronic kidney disease) stage 4, GFR 15-29 ml/min (HCC)    B femur fractures -Apparently mechanical fall resulting in B femur fractures -The R acute comminuted fracture of the distal femoral metaphysis was treated prior to arrival, while at [facility] -She was complaining of L >R pain at the time of my evaluation and Gabriela [name] had already ordered the CT of the left knee - which showed comminuted distal femur fracture on that side as well with intra-articular extension into the lateral femoral condyle -Orthopedics consulted -NPO in anticipation of surgical repair tonight -SCDs overnight, start Lovenox post-operatively (or as per ortho) -Pain control with Tylenol, Robaxin, Oxycodone, and Morphine prn -TOC team consult for rehab placement - CIR vs. SNF rehab is almost certainly needed in this context -Will need PT consult post-operatively -Hip fracture order set utilized  Pre-operative stratification -Orthopedic/spinal surgery is associated with an  intermediate (1-5%) cardiovascular risk for cardiac death and nonfatal MI -With her h/o insulin-requiring DM and CAD, her revised cardiac index gives a risk estimate of 6.6% -Because of this risk, she is recommended to have pre-operative EKG testing prior to surgery; unfortunately, this was not completed prior to surgery -However, her Detsky's Modified Cardiac Risk Index score is Class I, with a low cardiac risk -Her diabetes is an intermediate clinical predictor, but given her moderate functional capacity, it is reasonable for her to go to the OR without additional evaluation  Recurrent thigh wound infections with recent Pseudomonas infection -Removal of hardware is a consideration given her recurrent infections; this will be evaluated in the OR but would necessitate an even bigger surgery with more difficult recovery -She is being treated with Cipro x 6 weeks but was changed to [facility]fepime [facility] -Will consult ID given the complexity of her issues  Stage 4 CKD -Slightly better than baseline -Attempt to avoid nephrotoxic medications -Recheck BMP in AM  -She was being treated with sodium bicarbonate but this is not currently on her med rec  DM -Hypoglycemia during last hospitalization so she is currently only being treated with SSI - which she has needed only once in 6 weeks -Will continue to cover with sensitive-scale SSI -While good glycemic control will facilitate healing, she is also at much greater risk from hypoglycemia than hyper so will be judicious  H/o CABG -No recent chest pain -No recent CHF issues, although she does take daily Lasix -No current complaints regarding this issue -Continue ASA  Glaucoma -Continue Simbrinza, timolol  Anemia of chronic disease, Thrombocytopenia associated with MGUS (monoclonal gammopathy of unknown significance) -She required transfusion during last hospitalization and also while at [facility] -Hgb is currently 10, anticipated to drift back  down post-operatively and she may need further transfusion -Her recent homocysteine level was elevated, may need ongoing vitamin supplementation       Advance Care Planning:   Code Status: Full Code   Consults: Orthopedics; ID; Nutrition; TOC team; will need PT/OT post-operatively  DVT Prophylaxis: SCDs; Lovenox when approved by ortho  Family Communication: Daughter and 2 other family members were present throughout evaluation  Severity of Illness: The appropriate patient status for this patient is INPATIENT. Inpatient status is judged to be reasonable and necessary in order to provide  the required intensity of service to ensure the patient's safety. The patient's presenting symptoms, physical exam findings, and initial radiographic and laboratory data in the context of their chronic comorbidities is felt to place them at high risk for further clinical deterioration. Furthermore, it is not anticipated that the patient will be medically stable for discharge from the hospital within 2 midnights of admission.   * I certify that at the point of admission it is my clinical judgment that the patient will require inpatient hospital care spanning beyond 2 midnights from the point of admission due to high intensity of service, high risk for further deterioration and high frequency of surveillance required.*  Author: Karmen Bongo, MD 05/26/2022 7:36 PM  For on call review [email or web address].

## 2022-05-26 NOTE — Transfer of Care (Signed)
Immediate Anesthesia Transfer of Care Note  [name]  Procedure(s) Performed: OPEN REDUCTION INTERNAL FIXATION (ORIF) DISTAL FEMUR FRACTURE (Bilateral: Leg Upper)  Patient Location: PACU  Anesthesia Type:General  Level of Consciousness: awake and alert   Airway & Oxygen Therapy: Patient Spontanous Breathing  Post-op Assessment: Report given to RN and Post -op Vital signs reviewed and stable  Post vital signs: Reviewed and stable  Last Vitals:  Vitals Value Taken Time  BP 140/91   Temp    Pulse 87 05/26/22 2036  Resp 26 05/26/22 2036  SpO2 96 % 05/26/22 2036  Vitals shown include unvalidated device data.  Last Pain:  Vitals:   05/26/22 1507  TempSrc:   PainSc: 0-No pain         Complications: No notable events documented.

## 2022-05-26 NOTE — Consult Note (Signed)
Reason for Consult:Right distal femur fx Referring Physician: Karmen Bongo. Time called: 1110 Time at bedside: Gabriela [name] is an 88 y.o. female.  HPI: Gabriela [name] was at home working with PT when her left knee gave out and she fell to the floor onto her knees. She had immediate pain in both and could not get up. She was brought to the ED at [facility] where x-rays showed a right distal femur fx and orthopedic surgery was consulted. She was transferred to [facility] for definitive care. She lives at home alone and generally ambulates with a RW.  Past Medical History:  Diagnosis Date   Arthritis    Diabetes mellitus ([place])    Glaucoma    Hyperlipidemia    Hypertension    Shortness of breath    with exertion    Past Surgical History:  Procedure Laterality Date   CARDIAC CATHETERIZATION  04/22/13   CHOLECYSTECTOMY     CORONARY ARTERY BYPASS GRAFT N/A 05/31/2013   Procedure: Coronary Artery Bypass Grafting Times Three Using Left Internal Mammary Artery and Right Saphenous Leg Vein Harvested Endoscopically;  Surgeon: Ivin Poot, MD;  Location: [place];  Service: Open Heart Surgery;  Laterality: N/A;   ERCP N/A 10/20/2020   Procedure: ENDOSCOPIC RETROGRADE CHOLANGIOPANCREATOGRAPHY (ERCP);  Surgeon: Lucilla Lame, MD;  Location: [facility] ENDOSCOPY;  Service: Endoscopy;  Laterality: N/A;   ERCP N/A 12/22/2020   Procedure: ENDOSCOPIC RETROGRADE CHOLANGIOPANCREATOGRAPHY (ERCP);  Surgeon: Lucilla Lame, MD;  Location: [facility] ENDOSCOPY;  Service: Endoscopy;  Laterality: N/A;   EYE SURGERY     HIP SURGERY     right   INTRAOPERATIVE TRANSESOPHAGEAL ECHOCARDIOGRAM N/A 05/31/2013   Procedure: INTRAOPERATIVE TRANSESOPHAGEAL ECHOCARDIOGRAM;  Surgeon: Ivin Poot, MD;  Location: [place];  Service: Open Heart Surgery;  Laterality: N/A;   JOINT REPLACEMENT     MITRAL VALVE REPAIR N/A 05/31/2013   Procedure: MITRAL VALVE REPAIR (MVR);  Surgeon: Ivin Poot, MD;  Location: [place];  Service: Open Heart Surgery;   Laterality: N/A;   TONSILLECTOMY      Family History  Problem Relation Age of Onset   Diabetes Sister    Hypertension Mother     Social History:  reports that she has never smoked. She has never used smokeless tobacco. She reports that she does not drink alcohol and does not use drugs.  Allergies:  Allergies  Allergen Reactions   Sulfa Antibiotics Rash    Medications: I have reviewed the patient's current medications.  Results for orders placed or performed during the hospital encounter of 05/24/22 (from the past 48 hour(s))  CBG monitoring, ED     Status: Abnormal   Collection Time: 05/24/22  7:13 PM  Result Value Ref Range   Glucose-Capillary 195 (H) 70 - 99 mg/dL    Comment: Glucose reference range applies only to samples taken after fasting for at least 8 hours.  Hemoglobin and hematocrit, blood     Status: Abnormal   Collection Time: 05/24/22  7:59 PM  Result Value Ref Range   Hemoglobin 6.7 (L) 12.0 - 15.0 g/dL   HCT 21.5 (L) 36.0 - 46.0 %    Comment: Performed at [facility], [address] [place], [place] [address]  Prepare RBC (crossmatch)     Status: None   Collection Time: 05/25/22 10:26 AM  Result Value Ref Range   Order Confirmation      ORDER PROCESSED BY BLOOD BANK Performed at [facility], [place],  [place], [place] [address]   Prepare platelet pheresis     Status: None (Preliminary result)   Collection Time: 05/25/22 10:31 AM  Result Value Ref Range   Unit Number [id number]    Blood Component Type PLTP1 PSORALEN TREATED    Unit division 00    Status of Unit ISSUED    Transfusion Status      OK TO TRANSFUSE Performed at [facility], [address] [place], [place] [address]   CBG monitoring, ED     Status: Abnormal   Collection Time: 05/25/22  3:39 PM  Result Value Ref Range   Glucose-Capillary 189 (H) 70 - 99 mg/dL    Comment: Glucose reference range applies only to samples taken after fasting for at  least 8 hours.  CBC with Differential/Platelet     Status: Abnormal   Collection Time: 05/26/22  6:48 AM  Result Value Ref Range   WBC 19.1 (H) 4.0 - 10.5 K/uL   RBC 3.77 (L) 3.87 - 5.11 MIL/uL   Hemoglobin 10.3 (L) 12.0 - 15.0 g/dL    Comment: REPEATED TO VERIFY   HCT 31.0 (L) 36.0 - 46.0 %   MCV 82.2 80.0 - 100.0 fL   MCH 27.3 26.0 - 34.0 pg   MCHC 33.2 30.0 - 36.0 g/dL   RDW 18.0 (H) 11.5 - 15.5 %   Platelets 69 (L) 150 - 400 K/uL    Comment: Immature Platelet Fraction may be clinically indicated, consider ordering this additional test [id number]    nRBC 0.0 0.0 - 0.2 %   Neutrophils Relative % 41 %   Neutro Abs 7.8 (H) 1.7 - 7.7 K/uL   Lymphocytes Relative 8 %   Lymphs Abs 1.6 0.7 - 4.0 K/uL   Monocytes Relative 47 %   Monocytes Absolute 9.1 (H) 0.1 - 1.0 K/uL   Eosinophils Relative 0 %   Eosinophils Absolute 0.0 0.0 - 0.5 K/uL   Basophils Relative 1 %   Basophils Absolute 0.1 0.0 - 0.1 K/uL   RBC Morphology MIXED RBC POPULATION    Smear Review Normal platelet morphology    Immature Granulocytes 3 %   Abs Immature Granulocytes 0.52 (H) 0.00 - 0.07 K/uL    Comment: Performed at [facility], [place] [place], [place] [address]  Comprehensive metabolic panel     Status: Abnormal   Collection Time: 05/26/22  6:48 AM  Result Value Ref Range   Sodium 138 135 - 145 mmol/L   Potassium 3.1 (L) 3.5 - 5.1 mmol/L   Chloride 108 98 - 111 mmol/L   CO2 20 (L) 22 - 32 mmol/L   Glucose, Bld 243 (H) 70 - 99 mg/dL    Comment: Glucose reference range applies only to samples taken after fasting for at least 8 hours.   BUN 42 (H) 8 - 23 mg/dL   Creatinine, Ser 1.54 (H) 0.44 - 1.00 mg/dL   Calcium 7.8 (L) 8.9 - 10.3 mg/dL   Total Protein 7.6 6.5 - 8.1 g/dL   Albumin 3.0 (L) 3.5 - 5.0 g/dL   AST 24 15 - 41 U/L   ALT 7 0 - 44 U/L   Alkaline Phosphatase 77 38 - 126 U/L   Total Bilirubin 1.5 (H) 0.3 - 1.2 mg/dL   GFR, Estimated 31 (L) >60 mL/min    Comment:  (NOTE) Calculated using the CKD-EPI Creatinine Equation (2021)    Anion gap 10 5 - 15    Comment: Performed at [facility],  [place], [place] [address]    DG FEMUR PORT, MIN 2 VIEWS RIGHT  Result Date: 05/25/2022 CLINICAL DATA:  Preop planning. Right distal femur fracture. Post reduction images. EXAM: RIGHT FEMUR PORTABLE 2 VIEW COMPARISON:  Earlier same day radiograph at 1226 hours FINDINGS: Overlying splint material partially obscures fine osseous detail. Diffuse osteopenia. Unchanged position of the right distal femoral fracture fragments compared to earlier same day radiograph. Persistent suprapatellar joint effusion. Right hip prosthesis is intact and in appropriate position. No definite periprosthetic fracture. Unchanged osteolysis at the acetabular cup as seen on recent CT. IMPRESSION: Unchanged position of the right distal femoral fracture fragments compared to earlier same day radiograph. Electronically Signed   By: Ileana Roup M.D.   On: 05/25/2022 14:56   DG Knee 2 Views Right  Result Date: 05/25/2022 CLINICAL DATA:  Postreduction EXAM: RIGHT KNEE - 1-2 VIEW COMPARISON:  Knee radiographs 1 day prior. FINDINGS: Again seen is a comminuted fracture of the distal femur with posterior angulation of the distal fragment. Alignment in the frontal projection is similar to the prior study with slight medial apex angulation. Alignment is improved in the lateral projection compared to the study from 1 day prior. Femoroacetabular alignment appears maintained. There is surrounding soft tissue swelling with a suprapatellar effusion. IMPRESSION: Improved alignment of the comminuted distal femoral fracture following reduction. Electronically Signed   By: Valetta Mole M.D.   On: 05/25/2022 12:37   DG Femur Min 2 Views Left  Result Date: 05/24/2022 CLINICAL DATA:  Left femur pain. Fall. EXAM: LEFT FEMUR 2 VIEWS COMPARISON:  None Available. FINDINGS: The bones are diffusely  under mineralized. There is no evidence of fracture or other focal bone lesions. Degenerative change of the hip and knee. There are prominent vascular calcifications. Soft tissues are atrophic. IMPRESSION: 1. No fracture of the left femur. 2. Osteopenia/osteoporosis with degenerative change of the hip and knee. Electronically Signed   By: Keith Rake M.D.   On: 05/24/2022 20:19    Review of Systems  HENT:  Negative for ear discharge, ear pain, hearing loss and tinnitus.   Eyes:  Negative for photophobia and pain.  Respiratory:  Negative for cough and shortness of breath.   Cardiovascular:  Negative for chest pain.  Gastrointestinal:  Negative for abdominal pain, nausea and vomiting.  Genitourinary:  Negative for dysuria, flank pain, frequency and urgency.  Musculoskeletal:  Positive for arthralgias (Bilateral knees). Negative for back pain, myalgias and neck pain.  Neurological:  Negative for dizziness and headaches.  Hematological:  Does not bruise/bleed easily.  Psychiatric/Behavioral:  The patient is not nervous/anxious.    Blood pressure (!) 111/56, pulse 75, temperature 99.5 F (37.5 C), temperature source Oral, resp. rate 18, height '5\' 4"'$  (1.626 m), weight 56 kg, SpO2 99 %. Physical Exam Constitutional:      General: She is not in acute distress.    Appearance: She is well-developed. She is not diaphoretic.  HENT:     Head: Normocephalic and atraumatic.  Eyes:     General: No scleral icterus.       Right eye: No discharge.        Left eye: No discharge.     Conjunctiva/sclera: Conjunctivae normal.  Cardiovascular:     Rate and Rhythm: Normal rate and regular rhythm.  Pulmonary:     Effort: Pulmonary effort is normal. No respiratory distress.  Musculoskeletal:     Cervical back: Normal range of motion.     Comments: RLE  No traumatic wounds, ecchymosis, or rash  Long leg splint in place  No ankle effusion  Sens DPN, SPN, TN intact  Motor EHL 5/5  DP 1+, No significant  edema  LLE No traumatic wounds, ecchymosis, or rash  Mod TTP knee  Mod knee effusion  Sens DPN, SPN, TN intact  Motor EHL, ext, flex, evers 5/5  DP 1+, PT 0, No significant edema  Skin:    General: Skin is warm and dry.  Neurological:     Mental Status: She is alert.  Psychiatric:        Mood and Affect: Mood normal.        Behavior: Behavior normal.     Assessment/Plan: Right distal femur fx -- Plan ORIF today with Dr. Marcelino Scot. Please keep NPO. Left knee pain -- Will check CT to r/o occult fx. Multiple medical problems including DM; stage 4 CKD; and pancytopenia -- per primary service    Lisette Abu, PA-C Orthopedic Surgery [phone] 05/26/2022, 12:27 PM

## 2022-05-27 ENCOUNTER — Inpatient Hospital Stay (HOSPITAL_COMMUNITY): Payer: Medicare Other

## 2022-05-27 DIAGNOSIS — D61818 Other pancytopenia: Secondary | ICD-10-CM

## 2022-05-27 DIAGNOSIS — S728X1A Other fracture of right femur, initial encounter for closed fracture: Secondary | ICD-10-CM

## 2022-05-27 DIAGNOSIS — A498 Other bacterial infections of unspecified site: Secondary | ICD-10-CM

## 2022-05-27 DIAGNOSIS — S72331A Displaced oblique fracture of shaft of right femur, initial encounter for closed fracture: Secondary | ICD-10-CM

## 2022-05-27 DIAGNOSIS — T8450XA Infection and inflammatory reaction due to unspecified internal joint prosthesis, initial encounter: Secondary | ICD-10-CM

## 2022-05-27 DIAGNOSIS — S72412A Displaced unspecified condyle fracture of lower end of left femur, initial encounter for closed fracture: Secondary | ICD-10-CM

## 2022-05-27 LAB — BASIC METABOLIC PANEL
Anion gap: 12 (ref 5–15)
BUN: 41 mg/dL — ABNORMAL HIGH (ref 8–23)
CO2: 20 mmol/L — ABNORMAL LOW (ref 22–32)
Calcium: 8 mg/dL — ABNORMAL LOW (ref 8.9–10.3)
Chloride: 104 mmol/L (ref 98–111)
Creatinine, Ser: 1.59 mg/dL — ABNORMAL HIGH (ref 0.44–1.00)
GFR, Estimated: 30 mL/min — ABNORMAL LOW (ref >60)
Glucose, Bld: 196 mg/dL — ABNORMAL HIGH (ref 70–99)
Potassium: 3.3 mmol/L — ABNORMAL LOW (ref 3.5–5.1)
Sodium: 136 mmol/L (ref 135–145)

## 2022-05-27 LAB — CBC
HCT: 29.1 % — ABNORMAL LOW (ref 36.0–46.0)
Hemoglobin: 9.9 g/dL — ABNORMAL LOW (ref 12.0–15.0)
MCH: 27.9 pg (ref 26.0–34.0)
MCHC: 34 g/dL (ref 30.0–36.0)
MCV: 82 fL (ref 80.0–100.0)
Platelets: 50 10*3/uL — ABNORMAL LOW (ref 150–400)
RBC: 3.55 MIL/uL — ABNORMAL LOW (ref 3.87–5.11)
RDW: 18.3 % — ABNORMAL HIGH (ref 11.5–15.5)
WBC: 16.6 10*3/uL — ABNORMAL HIGH (ref 4.0–10.5)
nRBC: 0 % (ref 0.0–0.2)

## 2022-05-27 LAB — BPAM PLATELET PHERESIS
Blood Product Expiration Date: 202310142359
ISSUE DATE / TIME: 202310120004
Unit Type and Rh: 5100

## 2022-05-27 LAB — PREPARE PLATELET PHERESIS: Unit division: 0

## 2022-05-27 LAB — GLUCOSE, CAPILLARY
Glucose-Capillary: 205 mg/dL — ABNORMAL HIGH (ref 70–99)
Glucose-Capillary: 159 mg/dL — ABNORMAL HIGH (ref 70–99)
Glucose-Capillary: 159 mg/dL — ABNORMAL HIGH (ref 70–99)
Glucose-Capillary: 93 mg/dL (ref 70–99)

## 2022-05-27 LAB — VITAMIN D 25 HYDROXY (VIT D DEFICIENCY, FRACTURES): Vit D, 25-Hydroxy: <4.2 ng/mL — ABNORMAL LOW (ref 30–100)

## 2022-05-27 MED ORDER — ADULT MULTIVITAMIN W/MINERALS CH
1.0000 | ORAL_TABLET | Freq: Every day | ORAL | Status: DC
  Administered 2022-05-27 – 2022-06-02 (×7): 1 via ORAL
  Filled 2022-05-27 (×7): qty 1

## 2022-05-27 MED ORDER — GLUCERNA SHAKE PO LIQD
237.0000 mL | Freq: Three times a day (TID) | ORAL | Status: DC
  Administered 2022-05-27 – 2022-06-02 (×14): 237 mL via ORAL
  Filled 2022-05-27: qty 237

## 2022-05-27 MED ORDER — POTASSIUM CHLORIDE CRYS ER 20 MEQ PO TBCR
40.0000 meq | EXTENDED_RELEASE_TABLET | Freq: Once | ORAL | Status: AC
  Administered 2022-05-27: 40 meq via ORAL
  Filled 2022-05-27: qty 2

## 2022-05-27 MED ORDER — POLYETHYLENE GLYCOL 3350 17 G PO PACK
17.0000 g | PACK | Freq: Two times a day (BID) | ORAL | Status: DC
  Administered 2022-05-27 – 2022-06-01 (×11): 17 g via ORAL
  Filled 2022-05-27 (×12): qty 1

## 2022-05-27 MED ORDER — CIPROFLOXACIN HCL 500 MG PO TABS: 500.0000 mg | ORAL_TABLET | Freq: Every day | ORAL | Status: DC

## 2022-05-27 NOTE — Evaluation (Signed)
Occupational Therapy Evaluation Patient Details Name: Gabriela [name] MRN: [id number] DOB: 1926-08-06 Today's Date: 05/27/2022   History of Present Illness 88 y/o female presented to ED on 05/24/22 following fall. Sustained R distal femur fx and L distal femur fx. S/p ORIF of R femur fx and ORIF of L femur fx on 10/12. PMH: diabetes, CKD stage 4, HTN, glaucoma   Clinical Impression    Pt presents to OT with generalized weakness, post-op pain, and limited activity tolerance. She was living at home independently PTA with an aide assisting with IADLs and was mod I with the RW. She is now requiring max A +2 for sit > stand and was unable to transfer this session d/t pain and fatigue. She completed a lateral scoot transfer with max A overall and cueing for sequencing/technique. Pt would benefit from continued acute OT services to facilitate safe d/c home and optimize occupational performance. Her family would like to pursue CIR and pt was also agreeable with a strong motivation to get back to PLOF. Will continue to assess tolerance for intensity.     Recommendations for follow up therapy are one component of a multi-disciplinary discharge planning process, led by the attending physician.  Recommendations may be updated based on patient status, additional functional criteria and insurance authorization.   Follow Up Recommendations  Acute inpatient rehab (3hours/day)    Assistance Recommended at Discharge Frequent or constant Supervision/Assistance  Patient can return home with the following Two people to help with bathing/dressing/bathroom;Two people to help with walking and/or transfers    Functional Status Assessment  Patient has had a recent decline in their functional status and demonstrates the ability to make significant improvements in function in a reasonable and predictable amount of time.  Equipment Recommendations   (defer to next venue)    Recommendations for Other Services        Precautions / Restrictions Precautions Precautions: Fall Restrictions Weight Bearing Restrictions: Yes RLE Weight Bearing: Weight bearing as tolerated LLE Weight Bearing: Weight bearing as tolerated Other Position/Activity Restrictions: Transfers only      Mobility Bed Mobility Overal bed mobility: Needs Assistance Bed Mobility: Supine to Sit     Supine to sit: Max assist     General bed mobility comments: Assist need to bring BLE off bed and to elevate trunk    Transfers Overall transfer level: Needs assistance Equipment used: Rolling walker (2 wheels) Transfers: Sit to/from Stand Sit to Stand: Max assist, +2 physical assistance           General transfer comment: Unable to come fully uprighy      Balance Overall balance assessment: Needs assistance Sitting-balance support: No upper extremity supported, Feet supported Sitting balance-Leahy Scale: Fair     Standing balance support: During functional activity, Bilateral upper extremity supported Standing balance-Leahy Scale: Zero                             ADL either performed or assessed with clinical judgement   ADL Overall ADL's : Needs assistance/impaired Eating/Feeding: Set up   Grooming: Set up   Upper Body Bathing: Minimal assistance;Sitting   Lower Body Bathing: Total assistance;+2 for physical assistance;Sit to/from stand   Upper Body Dressing : Minimal assistance;Sitting   Lower Body Dressing: Total assistance;+2 for physical assistance;Sit to/from Archivist:  (unable)   Toileting- Clothing Manipulation and Hygiene: +2 for safety/equipment;Total assistance  General ADL Comments: Unable to come to full stand- max +2 from EOB. Lateral scoot with max A     Vision Baseline Vision/History: 1 Wears glasses Ability to See in Adequate Light: 0 Adequate Patient Visual Report: No change from baseline Vision Assessment?: No apparent visual deficits      Perception     Praxis      Pertinent Vitals/Pain Pain Assessment Pain Assessment: Faces Faces Pain Scale: Hurts even more Pain Location: L knee mainly, also R surgical site. Significant pain guarding Pain Descriptors / Indicators: Aching, Constant, Operative site guarding Pain Intervention(s): Limited activity within patient's tolerance, Monitored during session, Patient requesting pain meds-RN notified     Hand Dominance Right   Extremity/Trunk Assessment Upper Extremity Assessment Upper Extremity Assessment: Generalized weakness   Lower Extremity Assessment Lower Extremity Assessment: Defer to PT evaluation   Cervical / Trunk Assessment Cervical / Trunk Assessment: Kyphotic   Communication Communication Communication: HOH   Cognition Arousal/Alertness: Awake/alert Behavior During Therapy: WFL for tasks assessed/performed Overall Cognitive Status: Within Functional Limits for tasks assessed                                 General Comments: Family endorses she is cognitively at baseline     General Comments  Supportive family in room. Want to pursue CIR    Exercises     Shoulder Instructions      Home Living Family/patient expects to be discharged to:: Private residence Living Arrangements: Alone Available Help at Discharge: Family;Available PRN/intermittently Type of Home: House Home Access: Level entry     Home Layout: One level               Home Equipment: Conservation officer, nature (2 wheels);Rollator (4 wheels);Transport chair   Additional Comments: Two supportive family members willing to provide 24/7 if needed      Prior Functioning/Environment Prior Level of Function : Needs assist       Physical Assist : ADLs (physical);Mobility (physical) Mobility (physical): Bed mobility;Transfers ADLs (physical):  (Family states she was doing ADls herself, help with IADls) Mobility Comments: limited community distance amb w RW ADLs Comments:  modI w ADLs/IADLs, has aide in morning 7 days/week        OT Problem List: Decreased strength;Decreased activity tolerance;Decreased range of motion;Impaired balance (sitting and/or standing);Decreased coordination;Decreased safety awareness;Decreased knowledge of use of DME or AE;Decreased knowledge of precautions;Pain      OT Treatment/Interventions: Self-care/ADL training;Therapeutic exercise;Energy conservation;DME and/or AE instruction;Therapeutic activities;Patient/family education;Balance training    OT Goals(Current goals can be found in the care plan section) Acute Rehab OT Goals Patient Stated Goal: get stronger OT Goal Formulation: With patient/family Time For Goal Achievement: 06/10/22 Potential to Achieve Goals: Fair  OT Frequency: Min 2X/week    Co-evaluation PT/OT/SLP Co-Evaluation/Treatment: Yes Reason for Co-Treatment: Complexity of the patient's impairments (multi-system involvement);To address functional/ADL transfers   OT goals addressed during session: ADL's and self-care      AM-PAC OT "6 Clicks" Daily Activity     Outcome Measure Help from another person eating meals?: A Little Help from another person taking care of personal grooming?: A Little Help from another person toileting, which includes using toliet, bedpan, or urinal?: Total Help from another person bathing (including washing, rinsing, drying)?: A Lot Help from another person to put on and taking off regular upper body clothing?: A Little Help from another person to put on and taking off regular  lower body clothing?: Total 6 Click Score: 13   End of Session Equipment Utilized During Treatment: Rolling walker (2 wheels);Gait belt Nurse Communication: Mobility status  Activity Tolerance: Patient limited by pain Patient left: in chair;with call bell/phone within reach;with chair alarm set;with family/visitor present;with nursing/sitter in room  OT Visit Diagnosis: Unsteadiness on feet  (R26.81);Muscle weakness (generalized) (M62.81);Pain Pain - Right/Left: Left Pain - part of body: Leg                Time: 4818-5631 OT Time Calculation (min): 57 min Charges:  OT General Charges $OT Visit: 1 Visit OT Evaluation $OT Eval Moderate Complexity: 1 Mod OT Treatments $Therapeutic Activity: 23-37 mins Laverle Hobby, OTR/L, CBIS Acute Rehab Office: [phone]   Curtis Sites 05/27/2022, 11:33 AM

## 2022-05-27 NOTE — Progress Notes (Signed)
PROGRESS NOTE  Gabriela [name] EYC:[id number] DOB: May 05, 1926 DOA: 05/26/2022 PCP: [name], MD   LOS: 1 day   Brief Narrative / Interim history: 88 year old female with DM2, CKD 4, chronic pancytopenia comes into the hospital with a fall.  She was in her home, working with physical therapy, with a walker, all of a sudden her legs gave out and fell on her knees.  She presented to [facility] ER and was diagnosed with bilateral femur fractures.  Due to complexity of the fractures she was transferred to [facility].  She is status post operative repair on 10/12.  Subjective / 24h Interval events: She is doing well this morning.  Her left leg hurts more than the right.  No abdominal pain, no nausea or vomiting.  Assesement and Plan: Principal Problem:   Femur fracture, right (HCC) Active Problems:   Non-healing non-surgical wound   Type 2 diabetes mellitus with renal manifestations (HCC)   Thrombocytopenia (HCC)   CAD (coronary artery disease)   Hyperlipidemia   Glaucoma   MGUS (monoclonal gammopathy of unknown significance)   Femur fracture, left (HCC)   CKD (chronic kidney disease) stage 4, GFR 15-29 ml/min (HCC)   Principal problem Right supracondylar femur fracture below THA, left supracondylar femur fracture with intra-articular extension-orthopedic surgery consulted, she was taken to the OR on 10/12 and she is status post ORIF right and left using [employer] NCB plate.  She is weightbearing as tolerated, DVT prophylaxis with Lovenox -PT eval pending, suspect she will needs rehab, family wants patient to go to CIR  Active problems Right thigh wound with Pseudomonas infection, chronic osteomyelitis surrounding the acetabular cup-admitted for this in September.  ID was consulted and recommending ciprofloxacin 500 mg daily for 6 weeks up until 05/27/2022.  -ID consulted, plans to repeat a CT scan of the right hip  CKD 4-baseline creatinine ranging anywhere from  1.3-1.7.  Currently at baseline  Hypokalemia-replete and continue to monitor  History of CABG-no chest pain, stable  Glaucoma-continue home medications  Anemia of chronic disease-monitor hemoglobin, did not require blood transfusion at [facility] before transfer.  Thrombocytopenia-associated with MGUS, did require platelet transfusion while at [facility].  Continue to monitor.  Outpatient follow-up  Type 2 diabetes mellitus-continue sliding scale  CBG (last 3)  Recent Labs    05/26/22 2243 05/27/22 0731 05/27/22 1112  GLUCAP 207* 205* 159*    Scheduled Meds:  acetaminophen  650 mg Oral Q8H   aspirin EC  81 mg Oral Daily   brinzolamide  1 drop Both Eyes TID   And   brimonidine  1 drop Both Eyes TID   docusate sodium  100 mg Oral BID   enoxaparin (LOVENOX) injection  30 mg Subcutaneous Q24H   insulin aspart  0-9 Units Subcutaneous TID WC   metoprolol tartrate  12.5 mg Oral Daily   mupirocin ointment  1 Application Nasal BID   polyethylene glycol  17 g Oral BID   timolol  1 drop Both Eyes BID   Continuous Infusions:   ceFAZolin (ANCEF) IV 1 g (05/27/22 0558)   methocarbamol (ROBAXIN) IV     PRN Meds:.bisacodyl, methocarbamol **OR** methocarbamol (ROBAXIN) IV, morphine injection, ondansetron (ZOFRAN) IV, oxyCODONE  Current Outpatient Medications  Medication Instructions   Accu-Chek FastClix Lancets MISC To check blood sugars once a day. DX E11.9   albuterol (VENTOLIN HFA) 108 (90 Base) MCG/ACT inhaler INHALE 1-2 PUFFS INTO THE LUNGS EVERY 4 HOURS AS NEEDED FOR WHEEZING OR SHORTNESS OF BREATH.  aspirin EC 81 mg, Oral, Daily   Blood Glucose Monitoring Suppl (ACCU-CHEK AVIVA PLUS) w/Device KIT 1 each, Does not apply, Daily, Dx E11.9   ciprofloxacin (CIPRO) 500 mg, Oral, Daily-1800   cyanocobalamin 500 mcg, Oral, Daily   ferrous sulfate 325 mg, Oral, Daily with breakfast, TAKE 1 TABLET BY MOUTH DAILY.   furosemide (LASIX) 20 MG tablet TAKE 1 TABLET BY MOUTH EVERY DAY   glucose  blood (ACCU-CHEK AVIVA PLUS) test strip USE AS INSTRUCTED DX E11.9   metoprolol tartrate (LOPRESSOR) 25 MG tablet TAKE 1/2 TABLET BY MOUTH EVERY DAY   SIMBRINZA 1-0.2 % SUSP 1 drop, Both Eyes, 2 times daily   timolol (TIMOPTIC) 0.5 % ophthalmic solution 1 drop, Both Eyes, 2 times daily    Diet Orders (From admission, onward)     Start     Ordered   05/27/22 0539  Diet Carb Modified Fluid consistency: Thin; Room service appropriate? Yes  Diet effective now       Question Answer Comment  Diet-HS Snack? Nothing   Calorie Level Medium 1600-2000   Fluid consistency: Thin   Room service appropriate? Yes      05/27/22 0539            DVT prophylaxis: enoxaparin (LOVENOX) injection 30 mg Start: 05/27/22 1000 SCDs Start: 05/26/22 1324   Lab Results  Component Value Date   PLT 50 (L) 05/27/2022      Code Status: Full Code  Family Communication: Granddaughter at bedside  Status is: Inpatient  Remains inpatient appropriate because: Postop day 1   Level of care: Med-Surg  Consultants:  ID Orthopedic surgery  Objective: Vitals:   05/26/22 2100 05/26/22 2115 05/26/22 2139 05/27/22 0540  BP: 116/66 127/71 128/63 (!) 100/41  Pulse: 85 88 85 74  Resp: '20 20 16 18  ' Temp: (!) 97 F (36.1 C)  (!) 97.5 F (36.4 C) 98.1 F (36.7 C)  TempSrc:   Oral Oral  SpO2: 98% 97% 97% 100%  Weight:      Height:        Intake/Output Summary (Last 24 hours) at 05/27/2022 1115 Last data filed at 05/27/2022 0836 Gross per 24 hour  Intake 1628.67 ml  Output 750 ml  Net 878.67 ml   Wt Readings from Last 3 Encounters:  05/26/22 56 kg  05/11/22 56.7 kg  04/17/22 54.9 kg    Examination:  Constitutional: NAD Eyes: no scleral icterus ENMT: Mucous membranes are moist.  Neck: normal, supple Respiratory: clear to auscultation bilaterally, no wheezing, no crackles. Normal respiratory effort. No accessory muscle use.  Cardiovascular: Regular rate and rhythm, no murmurs / rubs /  gallops. No LE edema.  Abdomen: non distended, no tenderness. Bowel sounds positive.  Musculoskeletal: no clubbing / cyanosis.   Data Reviewed: I have independently reviewed following labs and imaging studies   CBC Recent Labs  Lab 05/24/22 1123 05/24/22 1959 05/26/22 0648 05/27/22 0521  WBC 4.6  --  19.1* 16.6*  HGB 7.3* 6.7* 10.3* 9.9*  HCT 23.4* 21.5* 31.0* 29.1*  PLT 57*  --  69* 50*  MCV 81.3  --  82.2 82.0  MCH 25.3*  --  27.3 27.9  MCHC 31.2  --  33.2 34.0  RDW 18.7*  --  18.0* 18.3*  LYMPHSABS 1.6  --  1.6  --   MONOABS 1.0  --  9.1*  --   EOSABS 0.0  --  0.0  --   BASOSABS 0.0  --  0.1  --  Recent Labs  Lab 05/24/22 1123 05/26/22 0648 05/27/22 0521  NA 138 138 136  K 3.0* 3.1* 3.3*  CL 107 108 104  CO2 20* 20* 20*  GLUCOSE 222* 243* 196*  BUN 50* 42* 41*  CREATININE 1.76* 1.54* 1.59*  CALCIUM 7.7* 7.8* 8.0*  AST 19 24  --   ALT 9 7  --   ALKPHOS 91 77  --   BILITOT 0.7 1.5*  --   ALBUMIN 3.4* 3.0*  --     ------------------------------------------------------------------------------------------------------------------ No results for input(s): "CHOL", "HDL", "LDLCALC", "TRIG", "CHOLHDL", "LDLDIRECT" in the last 72 hours.  Lab Results  Component Value Date   HGBA1C 6.2 (H) 12/27/2021   ------------------------------------------------------------------------------------------------------------------ No results for input(s): "TSH", "T4TOTAL", "T3FREE", "THYROIDAB" in the last 72 hours.  Invalid input(s): "FREET3"  Cardiac Enzymes No results for input(s): "CKMB", "TROPONINI", "MYOGLOBIN" in the last 168 hours.  Invalid input(s): "CK" ------------------------------------------------------------------------------------------------------------------ No results found for: "BNP"  CBG: Recent Labs  Lab 05/26/22 1449 05/26/22 2038 05/26/22 2243 05/27/22 0731 05/27/22 1112  GLUCAP 173* 209* 207* 205* 159*    Recent Results (from the past 240  hour(s))  Surgical PCR screen     Status: None   Collection Time: 05/26/22 11:14 AM   Specimen: Nasal Mucosa; Nasal Swab  Result Value Ref Range Status   MRSA, PCR NEGATIVE NEGATIVE Final   Staphylococcus aureus NEGATIVE NEGATIVE Final    Comment: (NOTE) The Xpert SA Assay (FDA approved for NASAL specimens in patients 87 years of age and older), is one component of a comprehensive surveillance program. It is not intended to diagnose infection nor to guide or monitor treatment. Performed at [facility], [place] [address] [place], [place] [address]      Radiology Studies: DG Knee Right Port  Result Date: 05/26/2022 CLINICAL DATA:  Left and right knee fractures, postop exam. EXAM: PORTABLE RIGHT KNEE - 1-2 VIEW COMPARISON:  05/25/2022. FINDINGS: There is a comminuted fracture of the distal femoral diaphysis with interval placement of fixation hardware. Alignment is improved. Air and fluid is noted in the joint capsule in the knee. There are vascular calcifications in the soft tissues. A right hip prosthesis is noted. IMPRESSION: Comminuted fracture of the distal femur with interval placement of fixation hardware with improved alignment. Electronically Signed   By: Brett Fairy M.D.   On: 05/26/2022 21:47   DG Knee Left Port  Result Date: 05/26/2022 CLINICAL DATA:  Postoperative left knee fracture. EXAM: PORTABLE LEFT KNEE - 1-2 VIEW COMPARISON:  Intraoperative fluoroscopy 05/26/2022 FINDINGS: Postoperative changes with lateral plate and screw fixation of the distal femoral shaft and metaphysis. Linear fracture of the metaphyseal region. Bones appear in near anatomic alignment and position. Surgical hardware appears in place. Soft tissue gas is consistent with recent surgery. Prominent vascular calcifications. IMPRESSION: Postoperative plate and screw fixation of fractures of the left distal femoral metaphysis with near anatomic position demonstrated. Electronically Signed   By: Lucienne Capers M.D.   On: 05/26/2022 21:46   DG FEMUR MIN 2 VIEWS LEFT  Result Date: 05/26/2022 CLINICAL DATA:  Femur fracture EXAM: LEFT FEMUR 2 VIEWS COMPARISON:  05/24/2022, CT 05/26/2022 FINDINGS: Six low resolution intraoperative spot views of the left femur. Total fluoroscopy time was 38 seconds, fluoroscopic dose of 1.26 mGy. The images demonstrate surgical plate and multiple screw fixation of comminuted distal femoral fracture. IMPRESSION: Intraoperative fluoroscopic assistance provided during surgical fixation of distal left femoral fracture Electronically Signed   By: Donavan Foil  M.D.   On: 05/26/2022 20:33   DG FEMUR, MIN 2 VIEWS RIGHT  Result Date: 05/26/2022 CLINICAL DATA:  Femur surgery EXAM: RIGHT FEMUR 2 VIEWS COMPARISON:  05/25/2022 FINDINGS: Six low resolution intraoperative spot views of the right femur. Total fluoroscopy time was 67 seconds, total fluoroscopic dose 2 mGy. Previous right hip replacement. Placement of surgical plate and multiple fixating screws across comminuted distal femoral fracture. Vascular calcifications. IMPRESSION: Intraoperative fluoroscopic assistance provided during surgical fixation of distal femoral fracture Electronically Signed   By: Donavan Foil M.D.   On: 05/26/2022 20:31   DG C-Arm 1-60 Min-No Report  Result Date: 05/26/2022 Fluoroscopy was utilized by the requesting physician.  No radiographic interpretation.   DG C-Arm 1-60 Min-No Report  Result Date: 05/26/2022 Fluoroscopy was utilized by the requesting physician.  No radiographic interpretation.   DG C-Arm 1-60 Min-No Report  Result Date: 05/26/2022 Fluoroscopy was utilized by the requesting physician.  No radiographic interpretation.   CT KNEE LEFT WO CONTRAST  Result Date: 05/26/2022 CLINICAL DATA:  Knee trauma, occult fracture suspected, xray done EXAM: CT OF THE LEFT KNEE WITHOUT CONTRAST TECHNIQUE: Multidetector CT imaging of the left knee was performed according to the standard  protocol. Multiplanar CT image reconstructions were also generated. RADIATION DOSE REDUCTION: This exam was performed according to the departmental dose-optimization program which includes automated exposure control, adjustment of the mA and/or kV according to patient size and/or use of iterative reconstruction technique. COMPARISON:  Radiograph 05/25/2022. FINDINGS: Bones/Joint/Cartilage There is a posterior angulated, comminuted distal frontal metaphyseal fracture with a transverse component and a longitudinal component extending to the upper articular surface of the lateral femoral condyle. 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 No evidence of proximal tibia or fibula fracture. No patellar fracture. Small joint effusion. Ligaments Suboptimally assessed by CT. Muscles and Tendons No acute myotendinous abnormality. Soft tissues Soft tissue swelling along the knee.  No focal fluid collection. IMPRESSION: Comminuted distal femur fracture with posterior angulation and intra-articular extension through the upper aspect of the lateral femoral condyle. Electronically Signed   By: Maurine Simmering M.D.   On: 05/26/2022 14:23     Marzetta Board, MD, PhD [facility]  Between 7 am - 7 pm I am available, please contact me via Amion (for emergencies) or Securechat (non urgent messages)  Between 7 pm - 7 am I am not available, please contact night coverage MD/APP via Amion

## 2022-05-27 NOTE — Progress Notes (Addendum)
Orthopaedic Trauma Service Progress Note  Patient ID: Gabriela [name] MRN: [id number] DOB/AGE: [birth year] 88 y.o.  Subjective:  Overall doing ok  C/o more pain in left leg than right  granddaughter at bedside  About to work with therapies   Uses walker at baseline as well as wheelchair Lives alone    ROS As above  Objective:   VITALS:   Vitals:   05/26/22 2100 05/26/22 2115 05/26/22 2139 05/27/22 0540  BP: 116/66 127/71 128/63 (!) 100/41  Pulse: 85 88 85 74  Resp: '20 20 16 18  '$ Temp: (!) 97 F (36.1 C)  (!) 97.5 F (36.4 C) 98.1 F (36.7 C)  TempSrc:   Oral Oral  SpO2: 98% 97% 97% 100%  Weight:      Height:        Estimated body mass index is 21.19 kg/m as calculated from the following:   Height as of this encounter: '5\' 4"'$  (1.626 m).   Weight as of this encounter: 56 kg.   Intake/Output      10/12 0701 10/13 0700 10/13 0701 10/14 0700   P.O.  120   I.V. (mL/kg) 1508.7 (26.9)    IV Piggyback 0    Total Intake(mL/kg) 1508.7 (26.9) 120 (2.1)   Urine (mL/kg/hr) 700    Blood 50    Total Output 750    Net +758.7 +120          LABS  Results for orders placed or performed during the hospital encounter of 05/26/22 (from the past 24 hour(s))  Surgical PCR screen     Status: None   Collection Time: 05/26/22 11:14 AM   Specimen: Nasal Mucosa; Nasal Swab  Result Value Ref Range   MRSA, PCR NEGATIVE NEGATIVE   Staphylococcus aureus NEGATIVE NEGATIVE  Glucose, capillary     Status: Abnormal   Collection Time: 05/26/22  2:49 PM  Result Value Ref Range   Glucose-Capillary 173 (H) 70 - 99 mg/dL  Type and screen [place]oard     Status: None   Collection Time: 05/26/22  3:00 PM  Result Value Ref Range   ABO/RH(D) O POS    Antibody Screen NEG    Sample Expiration      05/29/2022,2359 Performed at [facility], [place] [address], [place], [place] [address]    Glucose, capillary     Status: Abnormal   Collection Time: 05/26/22  8:38 PM  Result Value Ref Range   Glucose-Capillary 209 (H) 70 - 99 mg/dL  Glucose, capillary     Status: Abnormal   Collection Time: 05/26/22 10:43 PM  Result Value Ref Range   Glucose-Capillary 207 (H) 70 - 99 mg/dL  CBC     Status: Abnormal   Collection Time: 05/27/22  5:21 AM  Result Value Ref Range   WBC 16.6 (H) 4.0 - 10.5 K/uL   RBC 3.55 (L) 3.87 - 5.11 MIL/uL   Hemoglobin 9.9 (L) 12.0 - 15.0 g/dL   HCT 29.1 (L) 36.0 - 46.0 %   MCV 82.0 80.0 - 100.0 fL   MCH 27.9 26.0 - 34.0 pg   MCHC 34.0 30.0 - 36.0 g/dL   RDW 18.3 (H) 11.5 - 15.5 %   Platelets 50 (L) 150 - 400 K/uL   nRBC 0.0 0.0 - 0.2 %  Basic metabolic panel     Status: Abnormal   Collection Time: 05/27/22  5:21 AM  Result Value Ref Range   Sodium 136 135 - 145 mmol/L   Potassium 3.3 (L) 3.5 - 5.1 mmol/L   Chloride 104 98 - 111 mmol/L   CO2 20 (L) 22 - 32 mmol/L   Glucose, Bld 196 (H) 70 - 99 mg/dL   BUN 41 (H) 8 - 23 mg/dL   Creatinine, Ser 1.59 (H) 0.44 - 1.00 mg/dL   Calcium 8.0 (L) 8.9 - 10.3 mg/dL   GFR, Estimated 30 (L) >60 mL/min   Anion gap 12 5 - 15  Glucose, capillary     Status: Abnormal   Collection Time: 05/27/22  7:31 AM  Result Value Ref Range   Glucose-Capillary 205 (H) 70 - 99 mg/dL     PHYSICAL EXAM:   Gen: sitting up in bed, NAD, appears well, pleasant  Ext:       B Lower Extremities  Dressings stable   Scant strikethrough on lateral dressings   Aquacel dressing R thigh is stable   Exts are warm   + pulses  Motor and sensory function's are grossly intact  Swelling is well controlled  No DCT  Chronic internal rotation deformity right leg   Assessment/Plan: 1 Day Post-Op   Principal Problem:   Femur fracture, right (HCC) Active Problems:   Type 2 diabetes mellitus with renal manifestations (HCC)   Hyperlipidemia   CAD (coronary artery disease)   Glaucoma   Non-healing non-surgical wound    Thrombocytopenia (HCC)   MGUS (monoclonal gammopathy of unknown significance)   Femur fracture, left (HCC)   CKD (chronic kidney disease) stage 4, GFR 15-29 ml/min (HCC)   Anti-infectives (From admission, onward)    Start     Dose/Rate Route Frequency Ordered Stop   05/27/22 0500  ceFAZolin (ANCEF) IVPB 1 g/50 mL premix        1 g 100 mL/hr over 30 Minutes Intravenous Every 12 hours 05/26/22 2133 05/28/22 0459   05/26/22 1700  ceFAZolin (ANCEF) IVPB 2g/100 mL premix        2 g 200 mL/hr over 30 Minutes Intravenous On call to O.R. 05/26/22 1259 05/27/22 0559     .  POD/HD#: 56  88 year old female with bilateral distal femur fragility fractures, chronic right thigh wound with osteomyelitis versus osteolysis of THA  -Bilateral distal femur fragility fractures s/p ORIF  Weight-bear as tolerated for transfers only  Restricted range of motion bilateral knees  PT and OT evaluations   Suspect patient will need SNF   Ice and elevate as needed  Dressing changes starting tomorrow as needed  -Chronic right thigh wound with chronic osteomyelitis versus osteolysis of the total hip arthroplasty  Patient has been seeing [facility] in [place]  Would recommend follow-up with them for further discussion  Fortunately there does not appear to be any acute needs for this at this time  Looks like they have discussed this in the past with the total joint team in [place] and I would agree that that would be a significant surgery with increasing levels of morbidity and potential mortality   Patient currently has no complaints with respect to her right hip   - Pain management:  Minimize narcotics  Multimodal  - ABL anemia/Hemodynamics  Monitor  CBC in the morning  Chronic thrombocytopenia  - Medical issues   Per primary - DVT/PE prophylaxis:  Currently on Lovenox.  Will discontinue if her platelets  continue to drop  She is on renal dose - ID:   Perioperative  antibiotics  Defer additional antibiotics to ID  - Metabolic Bone Disease:  Fragility fracture bilateral femurs = osteoporosis  Vitamin D levels are pending - Activity:  Weight-bear as tolerated bilateral lower extremities for transfers    - Impediments to fracture healing:  Osteoporosis  - Dispo:  Ortho issues are stable  Therapy evaluations  TOC consult for SNF    Follow-up with orthopedics in 2 weeks   Sutures out around 06/09/2022   Jari Pigg, PA-C [phone] (C) 05/27/2022, 10:07 AM  [facility] [place] [address] [phone] Jenetta [phone] (F)    After 5pm and on the weekends please log on to Amion, go to orthopaedics and the look under the [facility] Call for the provider(s) on call. You can also call our office at [phone] and then follow the prompts to be connected to the call team.   Patient ID: Gabriela [name], female   DOB: [birth year], 88 y.o.   MRN: [id number]

## 2022-05-27 NOTE — Progress Notes (Signed)
Initial Nutrition Assessment  DOCUMENTATION CODES:   Not applicable  INTERVENTION:  Encourage adequate PO intake Glucerna Shake po TID, each supplement provides 220 kcal and 10 grams of protein MVI with minerals daily  NUTRITION DIAGNOSIS:   Increased nutrient needs related to post-op healing, hip fracture as evidenced by estimated needs.  GOAL:   Patient will meet greater than or equal to 90% of their needs  MONITOR:   PO intake, Supplement acceptance, Labs, Weight trends  REASON FOR ASSESSMENT:   Consult Assessment of nutrition requirement/status  ASSESSMENT:   Pt admitted from home after a fall leading to bilateral femur fractures. PMH significant for DM, CKD stage IV and pancytopenia. 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   10/12 s/p bilateral ORIF of femur fractures  Spoke with pt's granddaughter via phone call to room. She states that she ate well for lunch today. She had about half of her pizza and all of her ice cream. She did not eat her salad as she did not have a fork but plans to eat it later today.   She reports that the pt eats really well at home and has had no changes to her PO intake recently as long as she gets food that she likes. Denies chewing/swallowing difficulties.  She reports that she manages her diabetes really well with nutrition and the foods that she eats. She typically eats 3 meals per day. They have been considering adding in a nutrition supplement to increase the nutritional content of foods and increase her protein intake.   She is on top of managing her diabetes and checks her blood sugar daily. She was taken off her insulin a few weeks ago d/t hypoglycemia. Her blood sugars have been ranging between 125-150 after meals at home. At night they ensure her blood sugar does not go below 80.   Meal completions: 10/13: 50% breakfast  Pt granddaughter reports that her  weight has remained stable around 125 lbs and denies weight loss. No significant weight loss noted per review of chart.   Pt primarily independent PTA. Had good mobility and was using a rolling walker for mobility assistance.  Medications: colace, SSI 0-9 units TID, miralax, IV abx  Labs: potasium 3.3, BUN 41, Cr 1.59, GFR 30, HgbA1c 6.2% (5/15), CBG's 173-209 x24 hours  NUTRITION - FOCUSED PHYSICAL EXAM: RD working remotely. Deferred to follow up.   Diet Order:   Diet Order             Diet Carb Modified Fluid consistency: Thin; Room service appropriate? Yes  Diet effective now                   EDUCATION NEEDS:   Education needs have been addressed  Skin:  Skin Assessment: Skin Integrity Issues: Skin Integrity Issues:: Incisions, Other (Comment) Incisions: bilateral thighs Other: chronic R thigh wound  Last BM:  PTA  Height:   Ht Readings from Last 1 Encounters:  05/26/22 '5\' 4"'$  (1.626 m)    Weight:   Wt Readings from Last 1 Encounters:  05/26/22 56 kg   BMI:  Body mass index is 21.19 kg/m.  Estimated Nutritional Needs:   Kcal:  1300-1500  Protein:  70-85g  Fluid:  >/=1.5L  Clayborne Dana, RDN, LDN Clinical Nutrition

## 2022-05-27 NOTE — Evaluation (Signed)
Physical Therapy Evaluation Patient Details Name: Gabriela [name] MRN: [id number] DOB: [birth year] Today's Date: 05/27/2022  History of Present Illness  89 y/o female presented to ED on 05/24/22 following fall. Sustained R distal femur fx and L distal femur fx. S/p ORIF of R femur fx and ORIF of L femur fx on 10/12. PMH: diabetes, CKD stage 4, HTN, glaucoma  Clinical Impression  Patient admitted with the above. PTA, patient living at home alone and functioning at modI level with use of RW. Patient presents with weakness, impaired balance, decreased activity tolerance, pain, and impaired functional mobility. Educated patient on WB and mobility restrictions along with impact on overall mobility, patient and family verbalized understanding. Required maxA for bed mobility and attempted sit to stand with maxA+2 but unable to get into upright standing. Able to lateral scoot to recliner with maxA+2 (placed lift pad under patient for ease of transfer back to bed for nursing staff). Patient will benefit from skilled PT services during acute stay to address listed deficits. Recommend AIR to maximize functional mobility per family request.      Recommendations for follow up therapy are one component of a multi-disciplinary discharge planning process, led by the attending physician.  Recommendations may be updated based on patient status, additional functional criteria and insurance authorization.  Follow Up Recommendations Acute inpatient rehab (3hours/day)      Assistance Recommended at Discharge Frequent or constant Supervision/Assistance  Patient can return home with the following  A lot of help with walking and/or transfers;A lot of help with bathing/dressing/bathroom;Assistance with cooking/housework;Direct supervision/assist for medications management;Direct supervision/assist for financial management;Assist for transportation;Help with stairs or ramp for entrance    Equipment Recommendations  Wheelchair (measurements PT);Wheelchair cushion (measurements PT);Other (comment) (slideboard)  Recommendations for Other Services  Rehab consult    Functional Status Assessment Patient has had a recent decline in their functional status and demonstrates the ability to make significant improvements in function in a reasonable and predictable amount of time.     Precautions / Restrictions Precautions Precautions: Fall Restrictions Weight Bearing Restrictions: Yes RLE Weight Bearing: Weight bearing as tolerated LLE Weight Bearing: Weight bearing as tolerated Other Position/Activity Restrictions: Transfers only      Mobility  Bed Mobility Overal bed mobility: Needs Assistance Bed Mobility: Supine to Sit     Supine to sit: Max assist     General bed mobility comments: Assist need to bring BLE off bed and to elevate trunk    Transfers Overall transfer level: Needs assistance Equipment used: Rolling Stacy Deshler (2 wheels) Transfers: Sit to/from Stand, Bed to chair/wheelchair/BSC Sit to Stand: Max assist, +2 physical assistance          Lateral/Scoot Transfers: Max assist, +2 physical assistance, +2 safety/equipment General transfer comment: Unable to come fully upright. Able to laterally scoot to recliner on L with maxA+2. Lift pad placed under patient for ease of transfer for nursing staff    Ambulation/Gait                  Stairs            Wheelchair Mobility    Modified Rankin (Stroke Patients Only)       Balance Overall balance assessment: Needs assistance Sitting-balance support: No upper extremity supported, Feet supported Sitting balance-Leahy Scale: Fair     Standing balance support: During functional activity, Bilateral upper extremity supported Standing balance-Leahy Scale: Zero  Pertinent Vitals/Pain Pain Assessment Pain Assessment: Faces Faces Pain Scale: Hurts even more Pain Location: L knee  mainly, also R surgical site. Significant pain guarding Pain Descriptors / Indicators: Aching, Constant, Operative site guarding Pain Intervention(s): Monitored during session, Limited activity within patient's tolerance, Repositioned    Home Living Family/patient expects to be discharged to:: Private residence Living Arrangements: Alone Available Help at Discharge: Family;Available PRN/intermittently Type of Home: House Home Access: Level entry       Home Layout: One level Home Equipment: Conservation officer, nature (2 wheels);Rollator (4 wheels);Transport chair Additional Comments: Two supportive family members willing to provide 24/7 if needed    Prior Function Prior Level of Function : Needs assist       Physical Assist : ADLs (physical);Mobility (physical) Mobility (physical): Bed mobility;Transfers ADLs (physical):  (Family states she was doing ADls herself, help with IADls) Mobility Comments: limited community distance amb w RW ADLs Comments: modI w ADLs/IADLs, has aide in morning 7 days/week     Hand Dominance   Dominant Hand: Right    Extremity/Trunk Assessment   Upper Extremity Assessment Upper Extremity Assessment: Defer to OT evaluation    Lower Extremity Assessment Lower Extremity Assessment: RLE deficits/detail;LLE deficits/detail;Generalized weakness RLE Deficits / Details: chronic wound to R upper thigh; limited ROM due to recent sx and pain. Difficult to fully assess strength due to pain LLE Deficits / Details: Limited ROM due to recent sx and pain; unable to fully assess due to pain    Cervical / Trunk Assessment Cervical / Trunk Assessment: Kyphotic  Communication   Communication: HOH  Cognition Arousal/Alertness: Awake/alert Behavior During Therapy: WFL for tasks assessed/performed Overall Cognitive Status: Within Functional Limits for tasks assessed                                 General Comments: Family endorses she is cognitively at  baseline        General Comments General comments (skin integrity, edema, etc.): Supportive family in room. Want to pursue CIR    Exercises     Assessment/Plan    PT Assessment Patient needs continued PT services  PT Problem List Decreased strength;Decreased range of motion;Decreased activity tolerance;Decreased balance;Decreased mobility;Decreased knowledge of use of DME;Decreased knowledge of precautions;Decreased safety awareness       PT Treatment Interventions DME instruction;Functional mobility training;Therapeutic activities;Therapeutic exercise;Balance training;Patient/family education    PT Goals (Current goals can be found in the Care Plan section)  Acute Rehab PT Goals Patient Stated Goal: to get better PT Goal Formulation: With patient/family Time For Goal Achievement: 06/10/22 Potential to Achieve Goals: Fair    Frequency Min 4X/week     Co-evaluation PT/OT/SLP Co-Evaluation/Treatment: Yes Reason for Co-Treatment: Complexity of the patient's impairments (multi-system involvement);To address functional/ADL transfers PT goals addressed during session: Mobility/safety with mobility;Balance         AM-PAC PT "6 Clicks" Mobility  Outcome Measure Help needed turning from your back to your side while in a flat bed without using bedrails?: A Lot Help needed moving from lying on your back to sitting on the side of a flat bed without using bedrails?: A Lot Help needed moving to and from a bed to a chair (including a wheelchair)?: Total Help needed standing up from a chair using your arms (e.g., wheelchair or bedside chair)?: Total Help needed to walk in hospital room?: Total Help needed climbing 3-5 steps with a railing? : Total 6 Click Score:  8    End of Session   Activity Tolerance: Patient tolerated treatment well;Patient limited by pain Patient left: in chair;with call bell/phone within reach;with family/visitor present Nurse Communication: Mobility  status;Need for lift equipment PT Visit Diagnosis: Unsteadiness on feet (R26.81);Muscle weakness (generalized) (M62.81);History of falling (Z91.81);Other abnormalities of gait and mobility (R26.89)    Time: 0375-4360 PT Time Calculation (min) (ACUTE ONLY): 53 min   Charges:   PT Evaluation $PT Eval Moderate Complexity: 1 Mod PT Treatments $Therapeutic Activity: 8-22 mins        Aylanie Cubillos A. Gilford Rile PT, DPT Acute Rehabilitation Services Office [phone]   Linna Hoff 05/27/2022, 3:32 PM

## 2022-05-27 NOTE — Consult Note (Signed)
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 Right supracondylar femur fracture below total hip arthroplasty with left supracondylar femur fracture intra-articular extension status post ORIF to the right overlapping total hip femoral stem ORIF to the left with intra-articular extension using a Biomet NCB plate Pancytopenia Coronary artery disease status post coronary artery bypass graft Monoclonal gammopathy of unknown significance Diabetes mellitus  Plan:  Hold ciprofloxacin for now CT of the right hip with contrast to evaluate wound and prosthesis and prior fluid collection that was present If there is a target for IR to aspirate for culture that would be helpful  Principal Problem:   Femur fracture, right ([place]) Active Problems:   Type 2 diabetes mellitus with renal manifestations (HCC)   Hyperlipidemia   CAD (coronary artery disease)   Glaucoma   Non-healing non-surgical wound   Thrombocytopenia (HCC)   MGUS (monoclonal gammopathy of unknown significance)   Femur fracture, left (HCC)   CKD (chronic kidney disease) stage 4, GFR 15-29 ml/min (HCC)   Scheduled Meds:  acetaminophen  650 mg Oral Q8H   aspirin EC  81 mg Oral Daily   brinzolamide  1 drop Both Eyes TID   And   brimonidine  1 drop Both Eyes TID   docusate sodium  100 mg Oral BID   enoxaparin (LOVENOX) injection  30 mg Subcutaneous Q24H   feeding supplement (GLUCERNA SHAKE)  237 mL Oral TID BM   insulin aspart  0-9 Units Subcutaneous TID WC   metoprolol tartrate  12.5 mg Oral Daily   multivitamin with minerals  1 tablet Oral Daily   mupirocin ointment  1 Application Nasal BID   polyethylene glycol  17 g Oral BID   timolol  1 drop Both Eyes BID   Continuous Infusions:    ceFAZolin (ANCEF) IV 1 g (05/27/22 0558)   methocarbamol (ROBAXIN) IV     PRN Meds:.bisacodyl, methocarbamol **OR** methocarbamol (ROBAXIN) IV, morphine injection, ondansetron (ZOFRAN) IV, oxyCODONE  HPI: Gabriela [name] is a 88 y.o. female diabetes mellitus pancytopenia MGUS chronic kidney disease coronary artery disease who has had total hip arthroplasty many years ago and developed a nonhealing wound on the right side near the prosthetic hip.  She was first evaluated for this by Dr. Ola Spurr in January 15, 2018 for right thigh abscess that been present for a year.  The site was incised and drained in 2018 and she completed course of Bactrim and Keflex.  She continue to follow with wound care but the wound never healed.  All cultures have been unrevealing to that point in time.  She was doxycycline at the time.  Referred to our infectious ease clinic in [place] and saw [employer].  At the time she had had a culture taken from the wound which grew some diphtheroids.  She was continued on doxycycline and given a clindamycin cream.  She was supposed to follow-up in a month but not seen afterwards by our group here in [place].  Began to follow with Dr. Tama High at the infectious ease clinic at [facility] in November 2019  Being that have been done that year had not shown evidence of osteomyelitis.  Patient and her granddaughter  who was present state that the wound is continued to drain over the years and she has learned to buy particular type of packing material that she can place in the wound.  She is only sought treatment with antibiotics when there is been increasing pain or worsening drainage from the wound.   This September she was admitted to [facility] with severe right pain and inability walking.  CT of the hip showed chronic osteolysis around the acetabular cup of her prosthetic joint that radiology felt was consistent with "particle  disease.  There is a chronic fluid collection seen anterior to the right hip which seemed contained in the iliopsoas Apretude bursa was felt possibly related to bursitis versus particle disease.  In that hospital stay a wound culture was taken from the site and it ultimately grew a fairly sensitive Pseudomonas aeruginosa.  Apparently there were no tents by IR surgery to obtain deep cultures.  She was treated with ciprofloxacin with improvement in her pain and the drainage from the site.  She was working at home with physical therapy and unfortunately fell onto a concrete floor.  She was found to have fractures of bilateral femurs.  She has now undergone ORIF bilaterally including on the right overlying the total hip arthroplasty.  Her wound is open and still draining material though less so than usual.  Certainly the concern remains that she likely has prosthetic hip infection which is driving this nonhealing wound.  The question is is what organism is responsible for the infection.  While Pseudomonas was isolated on superficial wound culture this does not mean this is the culprit organism.  I want to order CT of the hip to reevaluate the site and see if there is an abscess or fluid collection that would be amenable to IR guided biopsy for culture.  If there is no such target for IR, I would favor watchful waiting for now and then trial of less toxic and dangerous antibiotics than ciprofloxacin as a empiric regimen for her hip.  For example something such as Augmentin would be a reasonable antibiotic to provide empiric coverage for strep and MSSA.  I spent 82 minutes with the patient including than 50% of the time in face to face counseling of the patient guarding her chronic wound or concerns for prostatic hip infection or fractures bilaterally, personally reviewing CT of right leg plain films of bilateral hips prior CT of right hip along with review of medical records in preparation for the  visit and during the visit and in coordination of her care.     Review of Systems: Review of Systems  Constitutional:  Negative for chills, fever, malaise/fatigue and weight loss.  HENT:  Negative for congestion and sore throat.   Eyes:  Negative for blurred vision and photophobia.  Respiratory:  Negative for cough, shortness of breath and wheezing.   Cardiovascular:  Negative for chest pain, palpitations and leg swelling.  Gastrointestinal:  Negative for abdominal pain, blood in stool, constipation, diarrhea, heartburn, melena, nausea and vomiting.  Genitourinary:  Negative for dysuria, flank pain and hematuria.  Musculoskeletal:  Positive for falls, joint pain and myalgias. Negative for back pain.  Skin:  Negative for itching and rash.  Neurological:  Positive for weakness. Negative for dizziness, focal weakness, loss of consciousness and headaches.  Endo/Heme/Allergies:  Does not bruise/bleed easily.  Psychiatric/Behavioral:  Negative for depression and suicidal ideas. The patient does not have insomnia.     Past Medical History:  Diagnosis Date  Arthritis    Diabetes mellitus (HCC)    Glaucoma    Hyperlipidemia    Hypertension    Shortness of breath    with exertion    Social History   Tobacco Use   Smoking status: Never   Smokeless tobacco: Never  Vaping Use   Vaping Use: Never used  Substance Use Topics   Alcohol use: No   Drug use: No    Family History  Problem Relation Age of Onset   Diabetes Sister    Hypertension Mother    Allergies  Allergen Reactions   Sulfa Antibiotics Rash    OBJECTIVE: Blood pressure (!) 98/45, pulse 79, temperature 97.9 F (36.6 C), temperature source Oral, resp. rate 18, height '5\' 4"'$  (1.626 m), weight 56 kg, SpO2 100 %.  Physical Exam Constitutional:      General: She is not in acute distress.    Appearance: She is underweight. She is not ill-appearing or diaphoretic.  HENT:     Head: Normocephalic and atraumatic.      Right Ear: Hearing and external ear normal.     Left Ear: Hearing and external ear normal.     Nose: No nasal deformity or rhinorrhea.  Eyes:     General: No scleral icterus.    Conjunctiva/sclera: Conjunctivae normal.     Right eye: Right conjunctiva is not injected.     Left eye: Left conjunctiva is not injected.     Pupils: Pupils are equal, round, and reactive to light.  Neck:     Vascular: No JVD.  Cardiovascular:     Rate and Rhythm: Normal rate and regular rhythm.     Heart sounds: S1 normal and S2 normal.  Pulmonary:     Effort: Pulmonary effort is normal. No respiratory distress.     Breath sounds: No wheezing.  Abdominal:     General: Bowel sounds are normal. There is no distension.     Palpations: Abdomen is soft.  Musculoskeletal:        General: Normal range of motion.     Right shoulder: Normal.     Left shoulder: Normal.     Cervical back: Normal range of motion and neck supple.     Right hip: Normal.     Left hip: Normal.     Right knee: Normal.     Left knee: Normal.  Lymphadenopathy:     Head:     Right side of head: No submandibular, preauricular or posterior auricular adenopathy.     Left side of head: No submandibular, preauricular or posterior auricular adenopathy.     Cervical: No cervical adenopathy.     Right cervical: No superficial or deep cervical adenopathy.    Left cervical: No superficial or deep cervical adenopathy.  Skin:    General: Skin is warm and dry.     Coloration: Skin is not pale.     Findings: No abrasion, bruising, ecchymosis, erythema, lesion or rash.     Nails: There is no clubbing.  Neurological:     General: No focal deficit present.     Mental Status: She is alert and oriented to person, place, and time.     Sensory: No sensory deficit.     Coordination: Coordination normal.     Gait: Gait normal.  Psychiatric:        Attention and Perception: She is attentive.        Mood and Affect: Mood normal.  Speech: Speech  normal.        Behavior: Behavior normal. Behavior is cooperative.        Thought Content: Thought content normal.        Judgment: Judgment normal.     Lab Results Lab Results  Component Value Date   WBC 16.6 (H) 05/27/2022   HGB 9.9 (L) 05/27/2022   HCT 29.1 (L) 05/27/2022   MCV 82.0 05/27/2022   PLT 50 (L) 05/27/2022    Lab Results  Component Value Date   CREATININE 1.59 (H) 05/27/2022   BUN 41 (H) 05/27/2022   NA 136 05/27/2022   K 3.3 (L) 05/27/2022   CL 104 05/27/2022   CO2 20 (L) 05/27/2022    Lab Results  Component Value Date   ALT 7 05/26/2022   AST 24 05/26/2022   GGT 101 (H) 11/23/2020   ALKPHOS 77 05/26/2022   BILITOT 1.5 (H) 05/26/2022     Microbiology: Recent Results (from the past 240 hour(s))  Surgical PCR screen     Status: None   Collection Time: 05/26/22 11:14 AM   Specimen: Nasal Mucosa; Nasal Swab  Result Value Ref Range Status   MRSA, PCR NEGATIVE NEGATIVE Final   Staphylococcus aureus NEGATIVE NEGATIVE Final    Comment: (NOTE) The Xpert SA Assay (FDA approved for NASAL specimens in patients 51 years of age and older), is one component of a comprehensive surveillance program. It is not intended to diagnose infection nor to guide or monitor treatment. Performed at [facility], [place] [address] [place], [place] [address]     Alcide Evener, [facility] [phone] pager  05/27/2022, 2:13 PM

## 2022-05-27 NOTE — Progress Notes (Signed)
I pulled '5mg'$   oxycodone for this patient around 10pm  last night and the daughter at the bedside refused it, so i returned it to pyxis and it did not allowed me to for some reason, so I wasted it with Aviva Signs RN in the [facility]. I have called pharmacy personal by name Wynona Neat and explained everything to her and also sent phamacy an email. I have notified my Director to after I got email from her this morning, will follow up and continue to monitor progress. Thanks.

## 2022-05-28 LAB — CBC
HCT: 25.3 % — ABNORMAL LOW (ref 36.0–46.0)
Hemoglobin: 8.5 g/dL — ABNORMAL LOW (ref 12.0–15.0)
MCH: 27.5 pg (ref 26.0–34.0)
MCHC: 33.6 g/dL (ref 30.0–36.0)
MCV: 81.9 fL (ref 80.0–100.0)
Platelets: 43 10*3/uL — ABNORMAL LOW (ref 150–400)
RBC: 3.09 MIL/uL — ABNORMAL LOW (ref 3.87–5.11)
RDW: 18.5 % — ABNORMAL HIGH (ref 11.5–15.5)
WBC: 21.2 10*3/uL — ABNORMAL HIGH (ref 4.0–10.5)
nRBC: 0 % (ref 0.0–0.2)

## 2022-05-28 LAB — COMPREHENSIVE METABOLIC PANEL
ALT: 6 U/L (ref 0–44)
AST: 23 U/L (ref 15–41)
Albumin: 2.2 g/dL — ABNORMAL LOW (ref 3.5–5.0)
Alkaline Phosphatase: 83 U/L (ref 38–126)
Anion gap: 10 (ref 5–15)
BUN: 50 mg/dL — ABNORMAL HIGH (ref 8–23)
CO2: 20 mmol/L — ABNORMAL LOW (ref 22–32)
Calcium: 8.1 mg/dL — ABNORMAL LOW (ref 8.9–10.3)
Chloride: 108 mmol/L (ref 98–111)
Creatinine, Ser: 2.08 mg/dL — ABNORMAL HIGH (ref 0.44–1.00)
GFR, Estimated: 22 mL/min — ABNORMAL LOW (ref >60)
Glucose, Bld: 112 mg/dL — ABNORMAL HIGH (ref 70–99)
Potassium: 4.2 mmol/L (ref 3.5–5.1)
Sodium: 138 mmol/L (ref 135–145)
Total Bilirubin: 1.5 mg/dL — ABNORMAL HIGH (ref 0.3–1.2)
Total Protein: 6.7 g/dL (ref 6.5–8.1)

## 2022-05-28 LAB — GLUCOSE, CAPILLARY
Glucose-Capillary: 121 mg/dL — ABNORMAL HIGH (ref 70–99)
Glucose-Capillary: 133 mg/dL — ABNORMAL HIGH (ref 70–99)
Glucose-Capillary: 166 mg/dL — ABNORMAL HIGH (ref 70–99)

## 2022-05-28 LAB — PHOSPHORUS: Phosphorus: 3.1 mg/dL (ref 2.5–4.6)

## 2022-05-28 LAB — MAGNESIUM: Magnesium: 1.7 mg/dL (ref 1.7–2.4)

## 2022-05-28 MED ORDER — INSULIN ASPART 100 UNIT/ML IJ SOLN: 0.0000 [IU] | Freq: Every day | INTRAMUSCULAR | Status: DC

## 2022-05-28 MED ORDER — INSULIN ASPART 100 UNIT/ML IJ SOLN
0.0000 [IU] | Freq: Three times a day (TID) | INTRAMUSCULAR | Status: DC
  Administered 2022-05-29 – 2022-05-30 (×3): 1 [IU] via SUBCUTANEOUS

## 2022-05-28 MED ORDER — SODIUM CHLORIDE 0.9 % IV SOLN: INTRAVENOUS | Status: AC

## 2022-05-28 MED ORDER — VITAMIN D (ERGOCALCIFEROL) 1.25 MG (50000 UNIT) PO CAPS
50000.0000 [IU] | ORAL_CAPSULE | ORAL | Status: DC
  Filled 2022-05-28: qty 1

## 2022-05-28 NOTE — Progress Notes (Signed)
Orthopaedic Trauma Progress Note  SUBJECTIVE:Doing fairly well this AM. Pain manageable at rest. Worked with PT and states it was tough due to increased pain in her legs with movement. No chest pain. No SOB. No nausea/vomiting. No other complaints. Tolerating diet and fluids.  Son at bedside  OBJECTIVE:  Vitals:   05/28/22 0441 05/28/22 0833  BP: (!) 105/45 (!) 110/52  Pulse:  85  Resp: 17 16  Temp: 98 F (36.7 C) 98.2 F (36.8 C)  SpO2:  100%    General: Sitting up in bed, NAD. Eating breakfast Respiratory: No increased work of breathing.  BLE: Mepilex dressing changed this AM, currently CDI. Aquacel dressing R thigh is stable. Extremities are warm. +DP pulses. Motor and sensory function grossly intact. .Swelling well controlled. Chronic internal rotation deformity right leg  IMAGING: Stable post op imaging.  CT scan right hip performed 04/28/2022 shows large chronic fluid collection measuring approximately 10.0 x 4.2 x 5.0 cm along the anterior aspect of the right hip involving the iliopsoas bursa.  LABS:  Results for orders placed or performed during the hospital encounter of 05/26/22 (from the past 24 hour(s))  Glucose, capillary     Status: Abnormal   Collection Time: 05/27/22 11:12 AM  Result Value Ref Range   Glucose-Capillary 159 (H) 70 - 99 mg/dL  Glucose, capillary     Status: Abnormal   Collection Time: 05/27/22  4:02 PM  Result Value Ref Range   Glucose-Capillary 159 (H) 70 - 99 mg/dL  Glucose, capillary     Status: None   Collection Time: 05/27/22  9:17 PM  Result Value Ref Range   Glucose-Capillary 93 70 - 99 mg/dL  Comprehensive metabolic panel     Status: Abnormal   Collection Time: 05/28/22  2:45 AM  Result Value Ref Range   Sodium 138 135 - 145 mmol/L   Potassium 4.2 3.5 - 5.1 mmol/L   Chloride 108 98 - 111 mmol/L   CO2 20 (L) 22 - 32 mmol/L   Glucose, Bld 112 (H) 70 - 99 mg/dL   BUN 50 (H) 8 - 23 mg/dL   Creatinine, Ser 2.08 (H) 0.44 - 1.00 mg/dL    Calcium 8.1 (L) 8.9 - 10.3 mg/dL   Total Protein 6.7 6.5 - 8.1 g/dL   Albumin 2.2 (L) 3.5 - 5.0 g/dL   AST 23 15 - 41 U/L   ALT 6 0 - 44 U/L   Alkaline Phosphatase 83 38 - 126 U/L   Total Bilirubin 1.5 (H) 0.3 - 1.2 mg/dL   GFR, Estimated 22 (L) >60 mL/min   Anion gap 10 5 - 15  CBC     Status: Abnormal   Collection Time: 05/28/22  2:45 AM  Result Value Ref Range   WBC 21.2 (H) 4.0 - 10.5 K/uL   RBC 3.09 (L) 3.87 - 5.11 MIL/uL   Hemoglobin 8.5 (L) 12.0 - 15.0 g/dL   HCT 25.3 (L) 36.0 - 46.0 %   MCV 81.9 80.0 - 100.0 fL   MCH 27.5 26.0 - 34.0 pg   MCHC 33.6 30.0 - 36.0 g/dL   RDW 18.5 (H) 11.5 - 15.5 %   Platelets 43 (L) 150 - 400 K/uL   nRBC 0.0 0.0 - 0.2 %  Magnesium     Status: None   Collection Time: 05/28/22  2:45 AM  Result Value Ref Range   Magnesium 1.7 1.7 - 2.4 mg/dL  Phosphorus     Status: None   Collection Time:  05/28/22  2:45 AM  Result Value Ref Range   Phosphorus 3.1 2.5 - 4.6 mg/dL  Glucose, capillary     Status: Abnormal   Collection Time: 05/28/22  8:35 AM  Result Value Ref Range   Glucose-Capillary 121 (H) 70 - 99 mg/dL    ASSESSMENT: Gabriela [name] is a 88 y.o. female, 2 Days Post-Op s/p OPEN REDUCTION INTERNAL FIXATION BILATERAL DISTAL FEMUR FRACTURES  CV/Blood loss: Acute blood loss anemia, Hgb 8.5 this a.m. Hemodynamically stable  PLAN: Weightbearing: WBAT RLE and LLE for transfers only ROM: Unrestricted ROM Incisional and dressing care: Change as needed Showering: Okay to begin getting incisions wet, clean gently with soap and water Orthopedic device(s): None  Pain management: Multimodal pain control.  Minimize narcotics VTE prophylaxis: SCDs ID: Perioperative ABX.  Defer additional ABX to ID Foley/Lines:  No foley, KVO IVFs Impediments to Fracture Healing: Osteoporosis.  Vitamin D levels less than 4, started on supplementation Dispo: Therapy evaluation ongoing, currently recommending CIR.  Plan for IR evaluation for possible fluid  collection from right hip for better targeted antibiotics.  No role for orthopedic intervention of the chronic right hip fluid collection at this time.  Discontinue Lovenox due to platelets continuing to drop.  Patient will need to continue vitamin D supplementation at discharge   Follow - up plan: 2 weeks with Dr. Marcelino Scot for suture removal   Contact information:  Katha Hamming MD, Rushie Nyhan PA-C. After hours and holidays please check [email or web address] for group call information for [facility]   Gwinda Passe, PA-C [phone] (office) [email or web address]

## 2022-05-28 NOTE — Progress Notes (Signed)
Inpatient Rehab Admissions Coordinator:    I met with pt. Regarding potential CIR admit. Pt. And family are interested and family can provide 24/7 support at d/c. I will open a case with her insurance and pursue for admit.   Clemens Catholic, [place], [place] Admissions Coordinator  [phone] ([place]) [phone] (office)

## 2022-05-28 NOTE — Progress Notes (Signed)
PROGRESS NOTE  Gabriela [name] VEH:[id number] DOB: August 27, 1925 DOA: 05/26/2022 PCP: [name], MD   LOS: 2 days   Brief Narrative / Interim history: 88 year old female with DM2, CKD 4, chronic pancytopenia comes into the hospital with a fall.  She was in her home, working with physical therapy, with a walker, all of a sudden her legs gave out and fell on her knees.  She presented to [facility] ER and was diagnosed with bilateral femur fractures.  Due to complexity of the fractures she was transferred to [facility].  She is status post operative repair on 10/12.  Subjective / 24h Interval events: No complaints.  She worked with PT yesterday, CIR was recommended.  Tells me that working with PT was "rough"  Assesement and Plan: Principal Problem:   Femur fracture, right ([place]) Active Problems:   Non-healing non-surgical wound   Type 2 diabetes mellitus with renal manifestations (HCC)   Thrombocytopenia (HCC)   CAD (coronary artery disease)   Hyperlipidemia   Glaucoma   MGUS (monoclonal gammopathy of unknown significance)   Femur fracture, left (HCC)   CKD (chronic kidney disease) stage 4, GFR 15-29 ml/min (HCC)   Principal problem Right supracondylar femur fracture below THA, left supracondylar femur fracture with intra-articular extension-orthopedic surgery consulted, she was taken to the OR on 10/12 and she is status post ORIF right and left using Biomet NCB plate.  She is weightbearing as tolerated, DVT prophylaxis with Lovenox -PT recommends CIR, consult placed  Active problems Right thigh wound with Pseudomonas infection, chronic osteomyelitis surrounding the acetabular cup-admitted for this in September.  ID was consulted and recommending ciprofloxacin 500 mg daily for 6 weeks up until 05/27/2022.  -ID consulted, CT scan of the hip done per ID, consult IR to see if they could sample the right hip fluid collection  CKD 4-baseline creatinine ranging anywhere  from 1.3-1.7.  Creatinine slightly up at 2.0 today, possibly dry, will provide limited fluids  Hypokalemia-potassium normal this morning  History of CABG-no chest pain, stable  Glaucoma-continue home medications  Anemia of chronic disease-monitor hemoglobin, did not require blood transfusion at [facility] before transfer.  Thrombocytopenia-associated with MGUS, did require platelet transfusion while at [facility].  Continue to monitor.  Outpatient follow-up  Type 2 diabetes mellitus-continue sliding scale  CBG (last 3)  Recent Labs    05/27/22 1602 05/27/22 2117 05/28/22 0835  GLUCAP 159* 93 121*     Scheduled Meds:  acetaminophen  650 mg Oral Q8H   aspirin EC  81 mg Oral Daily   brinzolamide  1 drop Both Eyes TID   And   brimonidine  1 drop Both Eyes TID   docusate sodium  100 mg Oral BID   feeding supplement (GLUCERNA SHAKE)  237 mL Oral TID BM   insulin aspart  0-9 Units Subcutaneous TID WC   metoprolol tartrate  12.5 mg Oral Daily   multivitamin with minerals  1 tablet Oral Daily   mupirocin ointment  1 Application Nasal BID   polyethylene glycol  17 g Oral BID   timolol  1 drop Both Eyes BID   Vitamin D (Ergocalciferol)  50,000 Units Oral Q7 days   Continuous Infusions:  sodium chloride     methocarbamol (ROBAXIN) IV     PRN Meds:.bisacodyl, methocarbamol **OR** methocarbamol (ROBAXIN) IV, morphine injection, ondansetron (ZOFRAN) IV, oxyCODONE  Current Outpatient Medications  Medication Instructions   Accu-Chek FastClix Lancets MISC To check blood sugars once a day. DX E11.9  albuterol (VENTOLIN HFA) 108 (90 Base) MCG/ACT inhaler INHALE 1-2 PUFFS INTO THE LUNGS EVERY 4 HOURS AS NEEDED FOR WHEEZING OR SHORTNESS OF BREATH.   aspirin EC 81 mg, Oral, Daily   Blood Glucose Monitoring Suppl (ACCU-CHEK AVIVA PLUS) w/Device KIT 1 each, Does not apply, Daily, Dx E11.9   ciprofloxacin (CIPRO) 500 mg, Oral, Daily-1800   cyanocobalamin 500 mcg, Oral, Daily   ferrous sulfate 325  mg, Oral, Daily with breakfast, TAKE 1 TABLET BY MOUTH DAILY.   furosemide (LASIX) 20 MG tablet TAKE 1 TABLET BY MOUTH EVERY DAY   glucose blood (ACCU-CHEK AVIVA PLUS) test strip USE AS INSTRUCTED DX E11.9   metoprolol tartrate (LOPRESSOR) 25 MG tablet TAKE 1/2 TABLET BY MOUTH EVERY DAY   SIMBRINZA 1-0.2 % SUSP 1 drop, Both Eyes, 2 times daily   timolol (TIMOPTIC) 0.5 % ophthalmic solution 1 drop, Both Eyes, 2 times daily    Diet Orders (From admission, onward)     Start     Ordered   05/27/22 0539  Diet Carb Modified Fluid consistency: Thin; Room service appropriate? Yes  Diet effective now       Question Answer Comment  Diet-HS Snack? Nothing   Calorie Level Medium 1600-2000   Fluid consistency: Thin   Room service appropriate? Yes      05/27/22 0539            DVT prophylaxis: SCDs Start: 05/26/22 1324   Lab Results  Component Value Date   PLT 43 (L) 05/28/2022      Code Status: Full Code  Family Communication: Granddaughter at bedside  Status is: Inpatient  Remains inpatient appropriate because: Postop day 1   Level of care: Med-Surg  Consultants:  ID Orthopedic surgery  Objective: Vitals:   05/27/22 0540 05/27/22 1100 05/28/22 0441 05/28/22 0833  BP: (!) 100/41 (!) 98/45 (!) 105/45 (!) 110/52  Pulse: 74 79  85  Resp: '18  17 16  ' Temp: 98.1 F (36.7 C) 97.9 F (36.6 C) 98 F (36.7 C) 98.2 F (36.8 C)  TempSrc: Oral Oral    SpO2: 100% 100%  100%  Weight:      Height:       No intake or output data in the 24 hours ending 05/28/22 1144  Wt Readings from Last 3 Encounters:  05/26/22 56 kg  05/11/22 56.7 kg  04/17/22 54.9 kg    Examination:  Constitutional: NAD Eyes: lids and conjunctivae normal, no scleral icterus ENMT: mmm Neck: normal, supple Respiratory: clear to auscultation bilaterally, no wheezing, no crackles. Normal respiratory effort.  Cardiovascular: Regular rate and rhythm, no murmurs / rubs / gallops. No LE edema. Abdomen:  soft, no distention, no tenderness. Bowel sounds positive.  Skin: no rashes Neurologic: no focal deficits, equal strength  Data Reviewed: I have independently reviewed following labs and imaging studies   CBC Recent Labs  Lab 05/24/22 1123 05/24/22 1959 05/26/22 0648 05/27/22 0521 05/28/22 0245  WBC 4.6  --  19.1* 16.6* 21.2*  HGB 7.3* 6.7* 10.3* 9.9* 8.5*  HCT 23.4* 21.5* 31.0* 29.1* 25.3*  PLT 57*  --  69* 50* 43*  MCV 81.3  --  82.2 82.0 81.9  MCH 25.3*  --  27.3 27.9 27.5  MCHC 31.2  --  33.2 34.0 33.6  RDW 18.7*  --  18.0* 18.3* 18.5*  LYMPHSABS 1.6  --  1.6  --   --   MONOABS 1.0  --  9.1*  --   --  EOSABS 0.0  --  0.0  --   --   BASOSABS 0.0  --  0.1  --   --      Recent Labs  Lab 05/24/22 1123 05/26/22 0648 05/27/22 0521 05/28/22 0245  NA 138 138 136 138  K 3.0* 3.1* 3.3* 4.2  CL 107 108 104 108  CO2 20* 20* 20* 20*  GLUCOSE 222* 243* 196* 112*  BUN 50* 42* 41* 50*  CREATININE 1.76* 1.54* 1.59* 2.08*  CALCIUM 7.7* 7.8* 8.0* 8.1*  AST 19 24  --  23  ALT 9 7  --  6  ALKPHOS 91 77  --  83  BILITOT 0.7 1.5*  --  1.5*  ALBUMIN 3.4* 3.0*  --  2.2*  MG  --   --   --  1.7     ------------------------------------------------------------------------------------------------------------------ No results for input(s): "CHOL", "HDL", "LDLCALC", "TRIG", "CHOLHDL", "LDLDIRECT" in the last 72 hours.  Lab Results  Component Value Date   HGBA1C 6.2 (H) 12/27/2021   ------------------------------------------------------------------------------------------------------------------ No results for input(s): "TSH", "T4TOTAL", "T3FREE", "THYROIDAB" in the last 72 hours.  Invalid input(s): "FREET3"  Cardiac Enzymes No results for input(s): "CKMB", "TROPONINI", "MYOGLOBIN" in the last 168 hours.  Invalid input(s): "CK" ------------------------------------------------------------------------------------------------------------------ No results found for:  "BNP"  CBG: Recent Labs  Lab 05/27/22 0731 05/27/22 1112 05/27/22 1602 05/27/22 2117 05/28/22 0835  GLUCAP 205* 159* 159* 93 121*     Recent Results (from the past 240 hour(s))  Surgical PCR screen     Status: None   Collection Time: 05/26/22 11:14 AM   Specimen: Nasal Mucosa; Nasal Swab  Result Value Ref Range Status   MRSA, PCR NEGATIVE NEGATIVE Final   Staphylococcus aureus NEGATIVE NEGATIVE Final    Comment: (NOTE) The Xpert SA Assay (FDA approved for NASAL specimens in patients 68 years of age and older), is one component of a comprehensive surveillance program. It is not intended to diagnose infection nor to guide or monitor treatment. Performed at [facility], [place] [address], [place], [place] [address]      Radiology Studies: CT HIP RIGHT WO CONTRAST  Result Date: 05/28/2022 CLINICAL DATA:  Septic arthritis suspected, hip, xray done EXAM: CT OF THE RIGHT HIP WITHOUT CONTRAST TECHNIQUE: Multidetector CT imaging of the right hip was performed according to the standard protocol. Multiplanar CT image reconstructions were also generated. RADIATION DOSE REDUCTION: This exam was performed according to the departmental dose-optimization program which includes automated exposure control, adjustment of the mA and/or kV according to patient size and/or use of iterative reconstruction technique. COMPARISON:  CT 04/15/2022 FINDINGS: Bones/Joint/Cartilage Postsurgical changes from right total hip arthroplasty. Femoral head is slightly superiorly located within the acetabular cup suggesting a component of polyethylene wear. No dislocation. No periprosthetic fracture. Chronic extensive osteolysis surrounding the acetabular cup with large defect of the medial acetabular wall. No pelvic diastasis. Ligaments Suboptimally assessed by CT. Muscles and Tendons Chronic fluid collection along the anterior aspect of the right hip is again noted, likely partially contained within the iliopsoas  bursa measuring approximately 10.0 x 4.2 x 5.0 cm (series 5, image 53; series 7, image 37), previously measured approximately 9.0 x 3.0 x 5.0 cm on 04/15/2022. Collection tracks into the subcutaneous soft tissues of the anterior proximal thigh. Generalized muscle atrophy. Soft tissues Chronic fluid collection, as above. No additional fluid collections. Extensive atherosclerotic vascular calcification. IMPRESSION: 1. Postsurgical changes from right total hip arthroplasty. Femoral head is slightly superiorly located within the acetabular  cup suggesting a component of polyethylene wear. Chronic osteolysis surrounding the acetabular cup. No acute periprosthetic fracture identified. 2. Chronic fluid collection along the anterior aspect of the right hip is again noted, likely partially contained within the iliopsoas bursa measuring approximately 10.0 x 4.2 x 5.0 cm, minimally increased from 04/15/2022. Collection tracks into the subcutaneous soft tissues of the anterior proximal thigh. Electronically Signed   By: Davina Poke D.O.   On: 05/28/2022 09:07     Marzetta Board, MD, PhD [facility]  Between 7 am - 7 pm I am available, please contact me via Amion (for emergencies) or Securechat (non urgent messages)  Between 7 pm - 7 am I am not available, please contact night coverage MD/APP via Amion

## 2022-05-28 NOTE — Progress Notes (Addendum)
ID Brief Note   CT rt hip  results pending  Following     Addendum  CT rt hip results reviewed, will discuss with ortho for possible orthopedic intervention as well as IR for possible aspiration of fluid collection.   Rosiland Oz, MD Infectious Disease Physician [facility] [address] [place], [place] [address] Phone: [phone]  Fax: [phone]

## 2022-05-28 NOTE — Progress Notes (Signed)
Request to IR for possible right hip fluid collection aspiration. Patient is s/p bilateral femur ORIF 05/26/22 and has a previous right total hip arthroplasty performed many years ago (present on hip x-ray 01/17/2016 which is last image available in Epic, there is a radiology report with no image from 2006 stating patient s/p right hip replacement with dislocation of the prosthetic femoral head). She developed a chronic right thigh would 6 years ago which was treated with abx and then monitored by ID.   Patient reviewed by Dr. Serafina Royals who notes collection to be unchanged since at least 2017 and there is no current indication for aspiration.   Above relayed to primary team today via [place] chat.  IR remains available as needed, please call on call IR physician with questions or concerns.  Candiss Norse, PA-C

## 2022-05-29 ENCOUNTER — Other Ambulatory Visit: Payer: Self-pay | Admitting: Family Medicine

## 2022-05-29 DIAGNOSIS — R509 Fever, unspecified: Secondary | ICD-10-CM

## 2022-05-29 DIAGNOSIS — D72829 Elevated white blood cell count, unspecified: Secondary | ICD-10-CM

## 2022-05-29 LAB — PHOSPHORUS: Phosphorus: 3.3 mg/dL (ref 2.5–4.6)

## 2022-05-29 LAB — CBC
HCT: 22.9 % — ABNORMAL LOW (ref 36.0–46.0)
Hemoglobin: 7.5 g/dL — ABNORMAL LOW (ref 12.0–15.0)
MCH: 27.4 pg (ref 26.0–34.0)
MCHC: 32.8 g/dL (ref 30.0–36.0)
MCV: 83.6 fL (ref 80.0–100.0)
Platelets: 42 10*3/uL — ABNORMAL LOW (ref 150–400)
RBC: 2.74 MIL/uL — ABNORMAL LOW (ref 3.87–5.11)
RDW: 18.8 % — ABNORMAL HIGH (ref 11.5–15.5)
WBC: 15.9 10*3/uL — ABNORMAL HIGH (ref 4.0–10.5)
nRBC: 0 % (ref 0.0–0.2)

## 2022-05-29 LAB — GLUCOSE, CAPILLARY
Glucose-Capillary: 192 mg/dL — ABNORMAL HIGH (ref 70–99)
Glucose-Capillary: 191 mg/dL — ABNORMAL HIGH (ref 70–99)
Glucose-Capillary: 209 mg/dL — ABNORMAL HIGH (ref 70–99)
Glucose-Capillary: 196 mg/dL — ABNORMAL HIGH (ref 70–99)
Glucose-Capillary: 162 mg/dL — ABNORMAL HIGH (ref 70–99)

## 2022-05-29 LAB — COMPREHENSIVE METABOLIC PANEL
ALT: 7 U/L (ref 0–44)
AST: 37 U/L (ref 15–41)
Albumin: 2.1 g/dL — ABNORMAL LOW (ref 3.5–5.0)
Alkaline Phosphatase: 106 U/L (ref 38–126)
Anion gap: 11 (ref 5–15)
BUN: 63 mg/dL — ABNORMAL HIGH (ref 8–23)
CO2: 17 mmol/L — ABNORMAL LOW (ref 22–32)
Calcium: 8 mg/dL — ABNORMAL LOW (ref 8.9–10.3)
Chloride: 105 mmol/L (ref 98–111)
Creatinine, Ser: 2.14 mg/dL — ABNORMAL HIGH (ref 0.44–1.00)
GFR, Estimated: 21 mL/min — ABNORMAL LOW (ref >60)
Glucose, Bld: 199 mg/dL — ABNORMAL HIGH (ref 70–99)
Potassium: 4.4 mmol/L (ref 3.5–5.1)
Sodium: 133 mmol/L — ABNORMAL LOW (ref 135–145)
Total Bilirubin: 1.4 mg/dL — ABNORMAL HIGH (ref 0.3–1.2)
Total Protein: 6.6 g/dL (ref 6.5–8.1)

## 2022-05-29 LAB — PREPARE RBC (CROSSMATCH)

## 2022-05-29 LAB — MAGNESIUM: Magnesium: 1.8 mg/dL (ref 1.7–2.4)

## 2022-05-29 MED ORDER — BRIMONIDINE TARTRATE 0.2 % OP SOLN
1.0000 [drp] | Freq: Two times a day (BID) | OPHTHALMIC | Status: DC
  Administered 2022-05-29 – 2022-06-02 (×8): 1 [drp] via OPHTHALMIC
  Filled 2022-05-29: qty 5

## 2022-05-29 MED ORDER — CALCIUM CARBONATE ANTACID 500 MG PO CHEW: 1.0000 | CHEWABLE_TABLET | Freq: Three times a day (TID) | ORAL | Status: DC | PRN

## 2022-05-29 MED ORDER — SODIUM CHLORIDE 0.9% IV SOLUTION: Freq: Once | INTRAVENOUS | Status: AC

## 2022-05-29 MED ORDER — BRINZOLAMIDE 1 % OP SUSP
1.0000 [drp] | Freq: Two times a day (BID) | OPHTHALMIC | Status: DC
  Administered 2022-05-29 – 2022-06-02 (×8): 1 [drp] via OPHTHALMIC
  Filled 2022-05-29: qty 10

## 2022-05-29 MED ORDER — ALUM & MAG HYDROXIDE-SIMETH 200-200-20 MG/5ML PO SUSP: 15.0000 mL | Freq: Once | ORAL | Status: DC

## 2022-05-29 MED ORDER — SODIUM CHLORIDE 0.9 % IV SOLN: INTRAVENOUS | Status: AC

## 2022-05-29 NOTE — Progress Notes (Signed)
PROGRESS NOTE  Gabriela [name] WJX:[id number] DOB: 11/23/1925 DOA: 05/26/2022 PCP: [name], MD   LOS: 3 days   Brief Narrative / Interim history: 88 year old female with DM2, CKD 4, chronic pancytopenia comes into the hospital with a fall.  She was in her home, working with physical therapy, with a walker, all of a sudden her legs gave out and fell on her knees.  She presented to [facility] ER and was diagnosed with bilateral femur fractures.  Due to complexity of the fractures she was transferred to [facility].  She is status post operative repair on 10/12.  Subjective / 24h Interval events: No major complaints this morning.  She is a bit short of breath.  Granddaughter is at bedside  Assesement and Plan: Principal Problem:   Femur fracture, right ([place]) Active Problems:   Non-healing non-surgical wound   Type 2 diabetes mellitus with renal manifestations (HCC)   Thrombocytopenia (HCC)   CAD (coronary artery disease)   Hyperlipidemia   Glaucoma   MGUS (monoclonal gammopathy of unknown significance)   Femur fracture, left (HCC)   CKD (chronic kidney disease) stage 4, GFR 15-29 ml/min (HCC)   Principal problem Right supracondylar femur fracture below THA, left supracondylar femur fracture with intra-articular extension-orthopedic surgery consulted, she was taken to the OR on 10/12 and she is status post ORIF right and left using Biomet NCB plate.  She is weightbearing as tolerated, DVT prophylaxis with Lovenox -PT recommends CIR, consult placed, insurance authorization started.  Active problems Right thigh wound with Pseudomonas infection, chronic osteomyelitis surrounding the acetabular cup-admitted for this in September.  ID was consulted and recommending ciprofloxacin 500 mg daily for 6 weeks up until 05/27/2022.  -ID consulted, CT scan of the hip done per ID, there is of chronic fluid collection there.  ID does not recommend any antibiotics  currently  CKD 4-baseline creatinine ranging anywhere from 1.3-1.7.  Creatinine still up to 2.1, urine looks concentrated, suspect she is dehydrated.  We will give 500 cc of fluids over 5 hours  Hypokalemia-potassium is normal today  History of CABG-no chest pain, stable  Glaucoma-continue home medications  Anemia of chronic disease-hemoglobin 7.5, keep above 8, transfuse a unit  Thrombocytopenia-associated with MGUS, did require platelet transfusion while at [facility].  Continue to monitor.  Outpatient follow-up  Type 2 diabetes mellitus-continue sliding scale  CBG (last 3)  Recent Labs    05/28/22 1836 05/29/22 0358 05/29/22 0809  GLUCAP 166* 192* 191*     Scheduled Meds:  sodium chloride   Intravenous Once   acetaminophen  650 mg Oral Q8H   alum & mag hydroxide-simeth  15 mL Oral Once   aspirin EC  81 mg Oral Daily   brinzolamide  1 drop Both Eyes TID   And   brimonidine  1 drop Both Eyes TID   docusate sodium  100 mg Oral BID   feeding supplement (GLUCERNA SHAKE)  237 mL Oral TID BM   insulin aspart  0-5 Units Subcutaneous QHS   insulin aspart  0-9 Units Subcutaneous TID WC   metoprolol tartrate  12.5 mg Oral Daily   multivitamin with minerals  1 tablet Oral Daily   mupirocin ointment  1 Application Nasal BID   polyethylene glycol  17 g Oral BID   timolol  1 drop Both Eyes BID   Vitamin D (Ergocalciferol)  50,000 Units Oral Q7 days   Continuous Infusions:  sodium chloride     methocarbamol (ROBAXIN) IV  PRN Meds:.bisacodyl, calcium carbonate, methocarbamol **OR** methocarbamol (ROBAXIN) IV, morphine injection, ondansetron (ZOFRAN) IV, oxyCODONE  Current Outpatient Medications  Medication Instructions   Accu-Chek FastClix Lancets MISC To check blood sugars once a day. DX E11.9   albuterol (VENTOLIN HFA) 108 (90 Base) MCG/ACT inhaler INHALE 1-2 PUFFS INTO THE LUNGS EVERY 4 HOURS AS NEEDED FOR WHEEZING OR SHORTNESS OF BREATH.   aspirin EC 81 mg, Oral, Daily    Blood Glucose Monitoring Suppl (ACCU-CHEK AVIVA PLUS) w/Device KIT 1 each, Does not apply, Daily, Dx E11.9   ciprofloxacin (CIPRO) 500 mg, Oral, Daily-1800   cyanocobalamin 500 mcg, Oral, Daily   ferrous sulfate 325 mg, Oral, Daily with breakfast, TAKE 1 TABLET BY MOUTH DAILY.   furosemide (LASIX) 20 MG tablet TAKE 1 TABLET BY MOUTH EVERY DAY   glucose blood (ACCU-CHEK AVIVA PLUS) test strip USE AS INSTRUCTED DX E11.9   metoprolol tartrate (LOPRESSOR) 25 MG tablet TAKE 1/2 TABLET BY MOUTH EVERY DAY   SIMBRINZA 1-0.2 % SUSP 1 drop, Both Eyes, 2 times daily   timolol (TIMOPTIC) 0.5 % ophthalmic solution 1 drop, Both Eyes, 2 times daily    Diet Orders (From admission, onward)     Start     Ordered   05/27/22 0539  Diet Carb Modified Fluid consistency: Thin; Room service appropriate? Yes  Diet effective now       Question Answer Comment  Diet-HS Snack? Nothing   Calorie Level Medium 1600-2000   Fluid consistency: Thin   Room service appropriate? Yes      05/27/22 0539            DVT prophylaxis: SCDs Start: 05/26/22 1324   Lab Results  Component Value Date   PLT 42 (L) 05/29/2022      Code Status: Full Code  Family Communication: Granddaughter at bedside  Status is: Inpatient  Remains inpatient appropriate because: Awaiting CIR   Level of care: Med-Surg  Consultants:  ID Orthopedic surgery  Objective: Vitals:   05/28/22 1155 05/28/22 1911 05/29/22 0355 05/29/22 0807  BP: (!) 108/51 (!) 105/53 (!) 120/49 120/72  Pulse: 80 84 73 78  Resp: _0 Temp: 98.8 F (37.1 C) 98 F (36.7 C)  98.2 F (36.8 C)  TempSrc: Oral   Oral  SpO2: 97% 99% 100% 100%  Weight:      Height:       No intake or output data in the 24 hours ending 05/29/22 1002  Wt Readings from Last 3 Encounters:  05/26/22 56 kg  05/11/22 56.7 kg  04/17/22 54.9 kg    Examination:  Constitutional: NAD Eyes: lids and conjunctivae normal, no scleral icterus ENMT: mmm Neck: normal,  supple Respiratory: clear to auscultation bilaterally, no wheezing, no crackles. Normal respiratory effort.  Cardiovascular: Regular rate and rhythm, no murmurs / rubs / gallops. No LE edema. Abdomen: soft, no distention, no tenderness. Bowel sounds positive.  Skin: no rashes Neurologic: no focal deficits, equal strength  Data Reviewed: I have independently reviewed following labs and imaging studies   CBC Recent Labs  Lab 05/24/22 1123 05/24/22 1959 05/26/22 0648 05/27/22 0521 05/28/22 0245 05/29/22 0512  WBC 4.6  --  19.1* 16.6* 21.2* 15.9*  HGB 7.3* 6.7* 10.3* 9.9* 8.5* 7.5*  HCT 23.4* 21.5* 31.0* 29.1* 25.3* 22.9*  PLT 57*  --  69* 50* 43* 42*  MCV 81.3  --  82.2 82.0 81.9 83.6  MCH 25.3*  --  27.3 27.9 27.5 27.4  MCHC 31.2  --  33.2 34.0 33.6 32.8  RDW 18.7*  --  18.0* 18.3* 18.5* 18.8*  LYMPHSABS 1.6  --  1.6  --   --   --   MONOABS 1.0  --  9.1*  --   --   --   EOSABS 0.0  --  0.0  --   --   --   BASOSABS 0.0  --  0.1  --   --   --      Recent Labs  Lab 05/24/22 1123 05/26/22 0648 05/27/22 0521 05/28/22 0245 05/29/22 0512  NA 138 138 136 138 133*  K 3.0* 3.1* 3.3* 4.2 4.4  CL 107 108 104 108 105  CO2 20* 20* 20* 20* 17*  GLUCOSE 222* 243* 196* 112* 199*  BUN 50* 42* 41* 50* 63*  CREATININE 1.76* 1.54* 1.59* 2.08* 2.14*  CALCIUM 7.7* 7.8* 8.0* 8.1* 8.0*  AST 19 24  --  23 37  ALT 9 7  --  6 7  ALKPHOS 91 77  --  83 106  BILITOT 0.7 1.5*  --  1.5* 1.4*  ALBUMIN 3.4* 3.0*  --  2.2* 2.1*  MG  --   --   --  1.7 1.8     ------------------------------------------------------------------------------------------------------------------ No results for input(s): "CHOL", "HDL", "LDLCALC", "TRIG", "CHOLHDL", "LDLDIRECT" in the last 72 hours.  Lab Results  Component Value Date   HGBA1C 6.2 (H) 12/27/2021   ------------------------------------------------------------------------------------------------------------------ No results for input(s): "TSH",  "T4TOTAL", "T3FREE", "THYROIDAB" in the last 72 hours.  Invalid input(s): "FREET3"  Cardiac Enzymes No results for input(s): "CKMB", "TROPONINI", "MYOGLOBIN" in the last 168 hours.  Invalid input(s): "CK" ------------------------------------------------------------------------------------------------------------------ No results found for: "BNP"  CBG: Recent Labs  Lab 05/28/22 0835 05/28/22 1156 05/28/22 1836 05/29/22 0358 05/29/22 0809  GLUCAP 121* 133* 166* 192* 191*     Recent Results (from the past 240 hour(s))  Surgical PCR screen     Status: None   Collection Time: 05/26/22 11:14 AM   Specimen: Nasal Mucosa; Nasal Swab  Result Value Ref Range Status   MRSA, PCR NEGATIVE NEGATIVE Final   Staphylococcus aureus NEGATIVE NEGATIVE Final    Comment: (NOTE) The Xpert SA Assay (FDA approved for NASAL specimens in patients 97 years of age and older), is one component of a comprehensive surveillance program. It is not intended to diagnose infection nor to guide or monitor treatment. Performed at [facility], [place] [address], [place], [place] [address]      Radiology Studies: No results found.   Marzetta Board, MD, PhD [facility]  Between 7 am - 7 pm I am available, please contact me via Amion (for emergencies) or Securechat (non urgent messages)  Between 7 pm - 7 am I am not available, please contact night coverage MD/APP via Amion

## 2022-05-29 NOTE — Consult Note (Signed)
[facility] Nurse Consult Note: Reason for Consult:Partial thickness area of skin loss (Stage 2). Photo provided by EDP. Wound type:pressure Pressure Injury POA: Yes Measurement:per nursing flow sheet Wound CNG:FREV, moist Drainage (amount, consistency, odor) small serous Periwound:intact, dry Dressing procedure/placement/frequency:I have provided Nursing with guidance for turning and repositioning, floatation of heels and for topical care with a silicone foam dressing, with every other day changes.  [place] nursing team will not follow, but will remain available to this patient, the nursing and medical teams.  Please re-consult if needed.  Thank you for inviting [place] to participate in this patient's Plan of Care.  Maudie Flakes, MSN, RN, CNS, [place], Serita Grammes, [employer], [employer] phone:  [phone]

## 2022-05-29 NOTE — Progress Notes (Addendum)
[facility] Infectious Diseases Follow Up Note  Patient Identification: Patient Name: Gabriela [name] MRN: [id number] [place] Date: 05/26/2022 10:26 AM Age: 88 y.o.Today's Date: 05/29/2022  Reason for Visit: Chronic rt thigh non healing wound   Principal Problem:   Femur fracture, right (HCC) Active Problems:   Type 2 diabetes mellitus with renal manifestations (HCC)   Hyperlipidemia   CAD (coronary artery disease)   Glaucoma   Non-healing non-surgical wound   Thrombocytopenia (HCC)   MGUS (monoclonal gammopathy of unknown significance)   Femur fracture, left (HCC)   CKD (chronic kidney disease) stage 4, GFR 15-29 ml/min (HCC)   Antibiotics:  Ciprofloxacin 10/10 Cefazolin  10/10-10/13 Off abtx since 10/14-c  Lines/Hardwares: RT hip arthroplasty and MV repair   Interval Events: She remains afebrile off of antibiotics for last 2 days, WBC is trending down  Assessment 88 year old female with PMH of DM, hypertension, hyperlipidemia, glaucoma, CAD status post CABG and mitral valve repair in 2014, right total hip arthroplasty many years ago followed by revision ( site healed well) with history of right thigh abscess in 2019 status post I&D in 2018 and completion of course of Bactrim and cephalexin then on PO doxycycline around 03/2018 ( superficial wound cx growing diptheroids).  MRI March 2019 with stranding and subcutaneous fat of the right anterior right thigh and negative for abscess osteomyelitis myositis or septic joint. 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 CT scan showed fluid collection at the iliopsoas=bursa. Wound culture was pseudomonas- no surgical procedure or IR procedure was considered. She was dced home on 6 weeks of PO cipro to complete on 05/27/22 per DR Delaine Lame recs.   She was doing well  until being admitted at [facility] for a mechanical fall while doing PT when her left leg gave out. She is s/p ORIF for RT supracondylar femur fracture overlapping with total hip femoral stem using a Biomet NCB plate as well as ORIF of left supracondylar femur fracture with intra-articular extension using Biomet NCB plate.   Per granddaughter at beside, the wound has been there at least from 2015 and has also healed in the past and thinks most recently flared up July last year. The wound has almost healed since hospital admission in September and is followed by wound care. She was at her baseline health prior to her fall with no fevers, chills or worsening redness/swelling or drainage in the rt thigh wound. She was taking ciprofloxacin as instructed with end date on 05/27/22  At initial ED presentation she was afebrile with no leukocytosis. Platelets 57. She started having some fevers in the setting of blood transfusion while hospitalized.  WBC went up to 21 post OP but down trended to 15.9 off abtx. Case discussed with Orthopedics as well as IR, no plans for orthopedic as well as IR intervention as fluid collection seems to be chronic and wound healing with recent prolonged course of abtx   Recommendations Discussed with her grand daughter at length, there is definitely a question about her rt PJ being infected with adjacent fluid collection in the anterior rt hip. However, this has been chronically present without any obvious symptoms. No plans for orthopedic as well as IR intervention given chronicity. Rt anterior thigh wound has almost closed with no signs of cellulitis and active infection ( see pic).  We discussed about watchful waiting and monitoring as  a safer option to see if and when she needs abtx rather than prolonged empiric antibiotics which may be more harmful with risk for C diff in an elderly female. Patient and grand daughter agreeable. I also advised to closely follow with wound care as well as  her ID provider Dr Delaine Lame with any concerns for the wound infection in the future.   It appears she will be going to Rehab at [facility] and would monitor her wound off abtx Management of anemia as well as tranfusion requirements per Primary.   ID available as needed, please call with questions   Rest of the management as per the primary team. Thank you for the consult. Please page with pertinent questions or concerns.  ______________________________________________________________________ Subjective patient seen and examined at the bedside.    Past Medical History:  Diagnosis Date   Arthritis    Diabetes mellitus ([place])    Glaucoma    Hyperlipidemia    Hypertension    Shortness of breath    with exertion   Past Surgical History:  Procedure Laterality Date   CARDIAC CATHETERIZATION  04/22/13   CHOLECYSTECTOMY     CORONARY ARTERY BYPASS GRAFT N/A 05/31/2013   Procedure: Coronary Artery Bypass Grafting Times Three Using Left Internal Mammary Artery and Right Saphenous Leg Vein Harvested Endoscopically;  Surgeon: Ivin Poot, MD;  Location: [place];  Service: Open Heart Surgery;  Laterality: N/A;   ERCP N/A 10/20/2020   Procedure: ENDOSCOPIC RETROGRADE CHOLANGIOPANCREATOGRAPHY (ERCP);  Surgeon: Lucilla Lame, MD;  Location[facility] ENDOSCOPY;  Service: Endoscopy;  Laterality: N/A;   ERCP N/A 12/22/2020   Procedure: ENDOSCOPIC RETROGRADE CHOLANGIOPANCREATOGRAPHY (ERCP);  Surgeon: Lucilla Lame, MD;  Location: [facility] ENDOSCOPY;  Service: Endoscopy;  Laterality: N/A;   EYE SURGERY     HIP SURGERY     right   INTRAOPERATIVE TRANSESOPHAGEAL ECHOCARDIOGRAM N/A 05/31/2013   Procedure: INTRAOPERATIVE TRANSESOPHAGEAL ECHOCARDIOGRAM;  Surgeon: Ivin Poot, MD;  Location: [place];  Service: Open Heart Surgery;  Laterality: N/A;   JOINT REPLACEMENT     MITRAL VALVE REPAIR N/A 05/31/2013   Procedure: MITRAL VALVE REPAIR (MVR);  Surgeon: Ivin Poot, MD;  Location: [place];  Service: Open Heart  Surgery;  Laterality: N/A;   TONSILLECTOMY       Vitals BP 120/72 (BP Location: Right Arm)   Pulse 78   Temp 98.2 F (36.8 C) (Oral)   Resp 17   Ht '5\' 4"'$  (1.626 m)   Wt 56 kg   SpO2 100%   BMI 21.19 kg/m    Physical Exam Constitutional:  lying in the bed, not in distress and appears comfortable     Comments:   Cardiovascular:     Rate and Rhythm: Normal rate and regular rhythm.     Heart sounds:   Pulmonary:     Effort: Pulmonary effort is normal on room air     Comments:   Abdominal:     Palpations: Abdomen is soft.     Tenderness: non tender and non distended   Musculoskeletal:        General: s/p ORIF in both thigh dressing C/D.I. Distal NV status is intact                    Rt anterior thigh wound has almost closed with no signs of cellulitis/fluctuance or crepitus                    Chronic internal rotation of Rt lower  extremity  Skin:    Comments:    Neurological:     General: grossly non focal, awake, alert and oriented   Psychiatric:        Mood and Affect: Mood normal.   Pertinent Microbiology    Latest Ref Rng & Units 05/29/2022    5:12 AM 05/28/2022    2:45 AM 05/27/2022    5:21 AM  CBC  WBC 4.0 - 10.5 K/uL 15.9  21.2  16.6   Hemoglobin 12.0 - 15.0 g/dL 7.5  8.5  9.9   Hematocrit 36.0 - 46.0 % 22.9  25.3  29.1   Platelets 150 - 400 K/uL 42  43  50       Latest Ref Rng & Units 05/29/2022    5:12 AM 05/28/2022    2:45 AM 05/27/2022    5:21 AM  CMP  Glucose 70 - 99 mg/dL 199  112  196   BUN 8 - 23 mg/dL 63  50  41   Creatinine 0.44 - 1.00 mg/dL 2.14  2.08  1.59   Sodium 135 - 145 mmol/L 133  138  136   Potassium 3.5 - 5.1 mmol/L 4.4  4.2  3.3   Chloride 98 - 111 mmol/L 105  108  104   CO2 22 - 32 mmol/L '17  20  20   '$ Calcium 8.9 - 10.3 mg/dL 8.0  8.1  8.0   Total Protein 6.5 - 8.1 g/dL 6.6  6.7    Total Bilirubin 0.3 - 1.2 mg/dL 1.4  1.5    Alkaline Phos 38 - 126 U/L 106  83    AST 15 - 41 U/L 37  23    ALT 0 - 44 U/L 7  6      Pertinent Lab.    Latest Ref Rng & Units 05/29/2022    5:12 AM 05/28/2022    2:45 AM 05/27/2022    5:21 AM  CBC  WBC 4.0 - 10.5 K/uL 15.9  21.2  16.6   Hemoglobin 12.0 - 15.0 g/dL 7.5  8.5  9.9   Hematocrit 36.0 - 46.0 % 22.9  25.3  29.1   Platelets 150 - 400 K/uL 42  43  50       Latest Ref Rng & Units 05/29/2022    5:12 AM 05/28/2022    2:45 AM 05/27/2022    5:21 AM  CMP  Glucose 70 - 99 mg/dL 199  112  196   BUN 8 - 23 mg/dL 63  50  41   Creatinine 0.44 - 1.00 mg/dL 2.14  2.08  1.59   Sodium 135 - 145 mmol/L 133  138  136   Potassium 3.5 - 5.1 mmol/L 4.4  4.2  3.3   Chloride 98 - 111 mmol/L 105  108  104   CO2 22 - 32 mmol/L '17  20  20   '$ Calcium 8.9 - 10.3 mg/dL 8.0  8.1  8.0   Total Protein 6.5 - 8.1 g/dL 6.6  6.7    Total Bilirubin 0.3 - 1.2 mg/dL 1.4  1.5    Alkaline Phos 38 - 126 U/L 106  83    AST 15 - 41 U/L 37  23    ALT 0 - 44 U/L 7  6       Pertinent Imaging today Plain films and CT images have been personally visualized and interpreted; radiology reports have been reviewed. Decision making incorporated into the Impression / Recommendations. CT HIP  RIGHT WO CONTRAST  Result Date: 05/28/2022 CLINICAL DATA:  Septic arthritis suspected, hip, xray done EXAM: CT OF THE RIGHT HIP WITHOUT CONTRAST TECHNIQUE: Multidetector CT imaging of the right hip was performed according to the standard protocol. Multiplanar CT image reconstructions were also generated. RADIATION DOSE REDUCTION: This exam was performed according to the departmental dose-optimization program which includes automated exposure control, adjustment of the mA and/or kV according to patient size and/or use of iterative reconstruction technique. COMPARISON:  CT 04/15/2022 FINDINGS: Bones/Joint/Cartilage Postsurgical changes from right total hip arthroplasty. Femoral head is slightly superiorly located within the acetabular cup suggesting a component of polyethylene wear. No dislocation. No periprosthetic  fracture. Chronic extensive osteolysis surrounding the acetabular cup with large defect of the medial acetabular wall. No pelvic diastasis. Ligaments Suboptimally assessed by CT. Muscles and Tendons Chronic fluid collection along the anterior aspect of the right hip is again noted, likely partially contained within the iliopsoas bursa measuring approximately 10.0 x 4.2 x 5.0 cm (series 5, image 53; series 7, image 37), previously measured approximately 9.0 x 3.0 x 5.0 cm on 04/15/2022. Collection tracks into the subcutaneous soft tissues of the anterior proximal thigh. Generalized muscle atrophy. Soft tissues Chronic fluid collection, as above. No additional fluid collections. Extensive atherosclerotic vascular calcification. IMPRESSION: 1. Postsurgical changes from right total hip arthroplasty. Femoral head is slightly superiorly located within the acetabular cup suggesting a component of polyethylene wear. Chronic osteolysis surrounding the acetabular cup. No acute periprosthetic fracture identified. 2. Chronic fluid collection along the anterior aspect of the right hip is again noted, likely partially contained within the iliopsoas bursa measuring approximately 10.0 x 4.2 x 5.0 cm, minimally increased from 04/15/2022. Collection tracks into the subcutaneous soft tissues of the anterior proximal thigh. Electronically Signed   By: Davina Poke D.O.   On: 05/28/2022 09:07   DG Knee Right Port  Result Date: 05/26/2022 CLINICAL DATA:  Left and right knee fractures, postop exam. EXAM: PORTABLE RIGHT KNEE - 1-2 VIEW COMPARISON:  05/25/2022. FINDINGS: There is a comminuted fracture of the distal femoral diaphysis with interval placement of fixation hardware. Alignment is improved. Air and fluid is noted in the joint capsule in the knee. There are vascular calcifications in the soft tissues. A right hip prosthesis is noted. IMPRESSION: Comminuted fracture of the distal femur with interval placement of fixation  hardware with improved alignment. Electronically Signed   By: Brett Fairy M.D.   On: 05/26/2022 21:47   DG Knee Left Port  Result Date: 05/26/2022 CLINICAL DATA:  Postoperative left knee fracture. EXAM: PORTABLE LEFT KNEE - 1-2 VIEW COMPARISON:  Intraoperative fluoroscopy 05/26/2022 FINDINGS: Postoperative changes with lateral plate and screw fixation of the distal femoral shaft and metaphysis. Linear fracture of the metaphyseal region. Bones appear in near anatomic alignment and position. Surgical hardware appears in place. Soft tissue gas is consistent with recent surgery. Prominent vascular calcifications. IMPRESSION: Postoperative plate and screw fixation of fractures of the left distal femoral metaphysis with near anatomic position demonstrated. Electronically Signed   By: Lucienne Capers M.D.   On: 05/26/2022 21:46   DG FEMUR MIN 2 VIEWS LEFT  Result Date: 05/26/2022 CLINICAL DATA:  Femur fracture EXAM: LEFT FEMUR 2 VIEWS COMPARISON:  05/24/2022, CT 05/26/2022 FINDINGS: Six low resolution intraoperative spot views of the left femur. Total fluoroscopy time was 38 seconds, fluoroscopic dose of 1.26 mGy. The images demonstrate surgical plate and multiple screw fixation of comminuted distal femoral fracture. IMPRESSION: Intraoperative fluoroscopic assistance provided during surgical  fixation of distal left femoral fracture Electronically Signed   By: Donavan Foil M.D.   On: 05/26/2022 20:33   DG FEMUR, MIN 2 VIEWS RIGHT  Result Date: 05/26/2022 CLINICAL DATA:  Femur surgery EXAM: RIGHT FEMUR 2 VIEWS COMPARISON:  05/25/2022 FINDINGS: Six low resolution intraoperative spot views of the right femur. Total fluoroscopy time was 67 seconds, total fluoroscopic dose 2 mGy. Previous right hip replacement. Placement of surgical plate and multiple fixating screws across comminuted distal femoral fracture. Vascular calcifications. IMPRESSION: Intraoperative fluoroscopic assistance provided during surgical  fixation of distal femoral fracture Electronically Signed   By: Donavan Foil M.D.   On: 05/26/2022 20:31   DG C-Arm 1-60 Min-No Report  Result Date: 05/26/2022 Fluoroscopy was utilized by the requesting physician.  No radiographic interpretation.   DG C-Arm 1-60 Min-No Report  Result Date: 05/26/2022 Fluoroscopy was utilized by the requesting physician.  No radiographic interpretation.   DG C-Arm 1-60 Min-No Report  Result Date: 05/26/2022 Fluoroscopy was utilized by the requesting physician.  No radiographic interpretation.   CT KNEE LEFT WO CONTRAST  Result Date: 05/26/2022 CLINICAL DATA:  Knee trauma, occult fracture suspected, xray done EXAM: CT OF THE LEFT KNEE WITHOUT CONTRAST TECHNIQUE: Multidetector CT imaging of the left knee was performed according to the standard protocol. Multiplanar CT image reconstructions were also generated. RADIATION DOSE REDUCTION: This exam was performed according to the departmental dose-optimization program which includes automated exposure control, adjustment of the mA and/or kV according to patient size and/or use of iterative reconstruction technique. COMPARISON:  Radiograph 05/25/2022. FINDINGS: Bones/Joint/Cartilage There is a posterior angulated, comminuted distal frontal metaphyseal fracture with a transverse component and a longitudinal component extending to the upper articular surface of the lateral femoral condyle. 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 No evidence of proximal tibia or fibula fracture. No patellar fracture. Small joint effusion. Ligaments Suboptimally assessed by CT. Muscles and Tendons No acute myotendinous abnormality. Soft tissues Soft tissue swelling along the knee.  No focal fluid collection. IMPRESSION: Comminuted distal femur fracture with posterior angulation and intra-articular extension through the upper aspect of the lateral femoral condyle. Electronically Signed   By: Maurine Simmering M.D.   On: 05/26/2022  14:23   DG FEMUR PORT, MIN 2 VIEWS RIGHT  Result Date: 05/25/2022 CLINICAL DATA:  Preop planning. Right distal femur fracture. Post reduction images. EXAM: RIGHT FEMUR PORTABLE 2 VIEW COMPARISON:  Earlier same day radiograph at 1226 hours FINDINGS: Overlying splint material partially obscures fine osseous detail. Diffuse osteopenia. Unchanged position of the right distal femoral fracture fragments compared to earlier same day radiograph. Persistent suprapatellar joint effusion. Right hip prosthesis is intact and in appropriate position. No definite periprosthetic fracture. Unchanged osteolysis at the acetabular cup as seen on recent CT. IMPRESSION: Unchanged position of the right distal femoral fracture fragments compared to earlier same day radiograph. Electronically Signed   By: Ileana Roup M.D.   On: 05/25/2022 14:56   DG Knee 2 Views Right  Result Date: 05/25/2022 CLINICAL DATA:  Postreduction EXAM: RIGHT KNEE - 1-2 VIEW COMPARISON:  Knee radiographs 1 day prior. FINDINGS: Again seen is a comminuted fracture of the distal femur with posterior angulation of the distal fragment. Alignment in the frontal projection is similar to the prior study with slight medial apex angulation. Alignment is improved in the lateral projection compared to the study from 1 day prior. Femoroacetabular alignment appears maintained. There is surrounding soft tissue swelling with a suprapatellar effusion. IMPRESSION: Improved alignment of the  comminuted distal femoral fracture following reduction. Electronically Signed   By: Valetta Mole M.D.   On: 05/25/2022 12:37   DG Femur Min 2 Views Left  Result Date: 05/24/2022 CLINICAL DATA:  Left femur pain. Fall. EXAM: LEFT FEMUR 2 VIEWS COMPARISON:  None Available. FINDINGS: The bones are diffusely under mineralized. There is no evidence of fracture or other focal bone lesions. Degenerative change of the hip and knee. There are prominent vascular calcifications. Soft tissues  are atrophic. IMPRESSION: 1. No fracture of the left femur. 2. Osteopenia/osteoporosis with degenerative change of the hip and knee. Electronically Signed   By: Keith Rake M.D.   On: 05/24/2022 20:19   DG Knee 1-2 Views Right  Result Date: 05/24/2022 CLINICAL DATA:  Fall.  Deformity. EXAM: RIGHT KNEE - 1-2 VIEW COMPARISON:  Right knee radiographs 05/19/2018 FINDINGS: There is diffuse decreased bone mineralization. There is horizontal linear lucency, mild cortical step-off, and comminution of the mediolateral cortices of the distal femoral metaphysis on frontal view. There is moderate approximate 27 degree medial apex angulation of this fracture on frontal view. On lateral view there is high-grade approximately 90% angulation of the distal femoral metaphysis, with the distal fracture component angulated in posterior/flexion positioning compared to the proximal fracture component. No definite acute fracture is seen within the visualized proximal tibia or fibula. Tiny joint effusion. Mild chronic enthesopathic change at the quadriceps insertion on the patella. Moderate to high-grade vascular calcifications. IMPRESSION: Acute, comminuted fracture of the distal femoral metaphysis with moderate medial apex angulation and high-grade approximately 90% posterior angulation of the distal fracture component. Electronically Signed   By: Yvonne Kendall M.D.   On: 05/24/2022 11:58     I spent 55 minutes for this patient encounter including review of prior medical records, coordination of care with primary/other specialist with greater than 50% of time being face to face/counseling and discussing diagnostics/treatment plan with the patient/family.  Electronically signed by:   Rosiland Oz, MD Infectious Disease Physician [facility] Infect[facility] Pager: [phone]

## 2022-05-29 NOTE — Progress Notes (Signed)
Orthopaedic Trauma Progress Note  SUBJECTIVE: Doing fairly well this AM. Pain manageable at rest. No chest pain. No SOB. No nausea/vomiting. No other complaints.  Has not been up with therapies since Friday.  Tolerating diet and fluids.  Receiving blood this AM per primary team due to drop in hemoglobin.  Granddaughter at bedside.  OBJECTIVE:  Vitals:   05/29/22 1115 05/29/22 1132  BP: 129/61 (!) 126/55  Pulse: 74 72  Resp: 17 18  Temp: 98.6 F (37 C) 98.1 F (36.7 C)  SpO2: 100% 100%    General: Sitting up in bed, NAD.  Respiratory: No increased work of breathing.  BLE: Mepilex dressings CDI. Aquacel dressing R thigh with moderate amount of drainage, this was removed and wound cleaned with saline.  New Mepilex dressing applied to the area.  Extremities are warm. +DP pulses. Motor and sensory function grossly intact. .Swelling well controlled. Chronic internal rotation deformity right leg  IMAGING: Stable post op imaging.  CT scan right hip performed 04/28/2022 shows large chronic fluid collection measuring approximately 10.0 x 4.2 x 5.0 cm along the anterior aspect of the right hip involving the iliopsoas bursa.  LABS:  Results for orders placed or performed during the hospital encounter of 05/26/22 (from the past 24 hour(s))  Glucose, capillary     Status: Abnormal   Collection Time: 05/28/22  6:36 PM  Result Value Ref Range   Glucose-Capillary 166 (H) 70 - 99 mg/dL  Glucose, capillary     Status: Abnormal   Collection Time: 05/29/22  3:58 AM  Result Value Ref Range   Glucose-Capillary 192 (H) 70 - 99 mg/dL  Comprehensive metabolic panel     Status: Abnormal   Collection Time: 05/29/22  5:12 AM  Result Value Ref Range   Sodium 133 (L) 135 - 145 mmol/L   Potassium 4.4 3.5 - 5.1 mmol/L   Chloride 105 98 - 111 mmol/L   CO2 17 (L) 22 - 32 mmol/L   Glucose, Bld 199 (H) 70 - 99 mg/dL   BUN 63 (H) 8 - 23 mg/dL   Creatinine, Ser 2.14 (H) 0.44 - 1.00 mg/dL   Calcium 8.0 (L) 8.9 -  10.3 mg/dL   Total Protein 6.6 6.5 - 8.1 g/dL   Albumin 2.1 (L) 3.5 - 5.0 g/dL   AST 37 15 - 41 U/L   ALT 7 0 - 44 U/L   Alkaline Phosphatase 106 38 - 126 U/L   Total Bilirubin 1.4 (H) 0.3 - 1.2 mg/dL   GFR, Estimated 21 (L) >60 mL/min   Anion gap 11 5 - 15  CBC     Status: Abnormal   Collection Time: 05/29/22  5:12 AM  Result Value Ref Range   WBC 15.9 (H) 4.0 - 10.5 K/uL   RBC 2.74 (L) 3.87 - 5.11 MIL/uL   Hemoglobin 7.5 (L) 12.0 - 15.0 g/dL   HCT 22.9 (L) 36.0 - 46.0 %   MCV 83.6 80.0 - 100.0 fL   MCH 27.4 26.0 - 34.0 pg   MCHC 32.8 30.0 - 36.0 g/dL   RDW 18.8 (H) 11.5 - 15.5 %   Platelets 42 (L) 150 - 400 K/uL   nRBC 0.0 0.0 - 0.2 %  Magnesium     Status: None   Collection Time: 05/29/22  5:12 AM  Result Value Ref Range   Magnesium 1.8 1.7 - 2.4 mg/dL  Phosphorus     Status: None   Collection Time: 05/29/22  5:12 AM  Result Value  Ref Range   Phosphorus 3.3 2.5 - 4.6 mg/dL  Glucose, capillary     Status: Abnormal   Collection Time: 05/29/22  8:09 AM  Result Value Ref Range   Glucose-Capillary 191 (H) 70 - 99 mg/dL  Prepare RBC (crossmatch)     Status: None   Collection Time: 05/29/22  9:47 AM  Result Value Ref Range   Order Confirmation      ORDER PROCESSED BY BLOOD BANK Performed at [facility], [place] [address] [place], [place] [address]   Glucose, capillary     Status: Abnormal   Collection Time: 05/29/22 11:39 AM  Result Value Ref Range   Glucose-Capillary 209 (H) 70 - 99 mg/dL    ASSESSMENT: Gabriela [name] is a 88 y.o. female, 3 Days Post-Op s/p OPEN REDUCTION INTERNAL FIXATION BILATERAL DISTAL FEMUR FRACTURES  CV/Blood loss: Acute blood loss anemia, Hgb 7.5 this morning.  Currently receiving 1 unit PRBCs.  Hemodynamically stable  PLAN: Weightbearing: WBAT RLE and LLE for transfers only ROM: Unrestricted ROM Incisional and dressing care: Change as needed Showering: Okay to begin getting incisions wet, clean gently with soap and water Orthopedic  device(s): None  Pain management: Multimodal pain control.  Minimize narcotics VTE prophylaxis: SCDs ID: Perioperative ABX.  No further antibiotics needed per ID Foley/Lines:  No foley, KVO IVFs Impediments to Fracture Healing: Osteoporosis.  Vitamin D levels less than 4, started on supplementation Dispo: Therapy evaluation ongoing, currently recommending CIR. Patient and family would like to pursue this if able, insurance authorization started.  Continue to change dressings as needed.  Patient will need to continue vitamin D supplementation at discharge   Follow - up plan: 2 weeks with Dr. Marcelino Scot for suture removal   Contact information:  Katha Hamming MD, Rushie Nyhan PA-C. After hours and holidays please check [email or web address] for group call information for [facility]   Gwinda Passe, PA-C [phone] (office) [email or web address]

## 2022-05-30 ENCOUNTER — Encounter (HOSPITAL_COMMUNITY): Payer: Self-pay | Admitting: Orthopedic Surgery

## 2022-05-30 ENCOUNTER — Other Ambulatory Visit: Payer: Self-pay | Admitting: Family Medicine

## 2022-05-30 LAB — COMPREHENSIVE METABOLIC PANEL
ALT: 6 U/L (ref 0–44)
AST: 36 U/L (ref 15–41)
Albumin: 2 g/dL — ABNORMAL LOW (ref 3.5–5.0)
Alkaline Phosphatase: 126 U/L (ref 38–126)
Anion gap: 10 (ref 5–15)
BUN: 68 mg/dL — ABNORMAL HIGH (ref 8–23)
CO2: 19 mmol/L — ABNORMAL LOW (ref 22–32)
Calcium: 8 mg/dL — ABNORMAL LOW (ref 8.9–10.3)
Chloride: 107 mmol/L (ref 98–111)
Creatinine, Ser: 1.82 mg/dL — ABNORMAL HIGH (ref 0.44–1.00)
GFR, Estimated: 25 mL/min — ABNORMAL LOW (ref >60)
Glucose, Bld: 161 mg/dL — ABNORMAL HIGH (ref 70–99)
Potassium: 4.6 mmol/L (ref 3.5–5.1)
Sodium: 136 mmol/L (ref 135–145)
Total Bilirubin: 1.4 mg/dL — ABNORMAL HIGH (ref 0.3–1.2)
Total Protein: 6.6 g/dL (ref 6.5–8.1)

## 2022-05-30 LAB — CBC
HCT: 26.5 % — ABNORMAL LOW (ref 36.0–46.0)
Hemoglobin: 8.9 g/dL — ABNORMAL LOW (ref 12.0–15.0)
MCH: 27.3 pg (ref 26.0–34.0)
MCHC: 33.6 g/dL (ref 30.0–36.0)
MCV: 81.3 fL (ref 80.0–100.0)
Platelets: 47 10*3/uL — ABNORMAL LOW (ref 150–400)
RBC: 3.26 MIL/uL — ABNORMAL LOW (ref 3.87–5.11)
RDW: 18.6 % — ABNORMAL HIGH (ref 11.5–15.5)
WBC: 12.9 10*3/uL — ABNORMAL HIGH (ref 4.0–10.5)
nRBC: 0 % (ref 0.0–0.2)

## 2022-05-30 LAB — BPAM RBC
Blood Product Expiration Date: 202311152359
ISSUE DATE / TIME: 202310151116
Unit Type and Rh: 5100

## 2022-05-30 LAB — TYPE AND SCREEN
ABO/RH(D): O POS
Antibody Screen: NEGATIVE
Unit division: 0

## 2022-05-30 LAB — GLUCOSE, CAPILLARY
Glucose-Capillary: 139 mg/dL — ABNORMAL HIGH (ref 70–99)
Glucose-Capillary: 201 mg/dL — ABNORMAL HIGH (ref 70–99)
Glucose-Capillary: 210 mg/dL — ABNORMAL HIGH (ref 70–99)
Glucose-Capillary: 171 mg/dL — ABNORMAL HIGH (ref 70–99)

## 2022-05-30 LAB — MAGNESIUM: Magnesium: 1.9 mg/dL (ref 1.7–2.4)

## 2022-05-30 NOTE — Telephone Encounter (Signed)
Requested Prescriptions  Pending Prescriptions Disp Refills  . ACCU-CHEK AVIVA PLUS test strip [Pharmacy Med Name: ACCU-CHEK AVIVA PLUS TEST STRP] 100 strip 1    Sig: USE AS INSTRUCTED DX E11.9     Endocrinology: Diabetes - Testing Supplies Passed - 05/29/2022  1:27 AM      Passed - Valid encounter within last 12 months    Recent Outpatient Visits          2 weeks ago Non-healing non-surgical wound   [facility] Simmons-Robinson, Riki Sheer, MD   1 month ago Hospital discharge follow-up   [place], [place], PA-C   2 months ago Bilateral carpal tunnel syndrome   [facility] Jerrol Banana., MD   5 months ago Diabetes mellitus without complication [facility])   [facility] Jerrol Banana., MD   1 year ago Type 2 diabetes mellitus with other circulatory complication, unspecified whether long term insulin use [facility])   [facility] Jerrol Banana., MD      Future Appointments            In 2 months Simmons-Robinson, Riki Sheer, MD [facility] [place]

## 2022-05-30 NOTE — Progress Notes (Addendum)
Mobility Specialist Progress Note   05/30/22 1153  Mobility  Activity Transferred from bed to chair  Level of Assistance +2 (takes two people)  Assistive Device MaxiMove  RLE Weight Bearing WBAT  LLE Weight Bearing WBAT  Activity Response Tolerated well  $Mobility charge 1 Mobility   NT requesting assistance to get pt from bed to chair. Required +2A for safety, no faults during transfer. Pt left in chair w/ chair alarm on and all needs met.   Holland Falling Mobility Specialist MS [facility] #:  [phone] Acute Rehab Office:  [phone]

## 2022-05-30 NOTE — TOC Progression Note (Signed)
Transition of Care [facility]) - Progression Note    Patient Details  Name: Gabriela [name] MRN: [id number] Date of Birth: [birth year]  [facility]) CM/SW Contact  Joanne Chars, LCSW Phone Number: 05/30/2022, 10:43 AM  Clinical Narrative:   CSW requested by RN to speak with pt and granddaughter about POA.  CSW spoke with them, pt states a basic understanding of POA and indicates she would like granddaughter Gabriela [name] to be her HCPOA.  CSW contacted chaplain office and they can come by to start the process in about 30 minutes.         Expected Discharge Plan and Services                                                 Social Determinants of Health (SDOH) Interventions    Readmission Risk Interventions     No data to display

## 2022-05-30 NOTE — PMR Pre-admission (Signed)
PMR Admission Coordinator Pre-Admission Assessment  [name] is an 88 y.o., female MRN: [id number] DOB: 1926/01/11 Height: '5\' 4"'  (162.6 cm) Weight: 56 kg  Insurance Information HMO: yes    PPO:      PCP:      IPA:      80/20:      OTHER:  PRIMARY: [employer] MCR      Policy#: [id number]      Subscriber: patient CM Name:       Phone#:      Fax#: [phone] Pre-Cert#: [id number]   Appeal overturned. Received approval from [place] on 06/02/22. Pt approved for 7 days 06/02/22-06/08/22   Employer:  Benefits:  Phone #: [email or web address]     Name:  Irene Shipper Date: 08/15/2021 - still active Deductible: does not have one OOP Max: $8,300 ($1,797.46 met) CIR: $1,556/ admission copay SNF: $0.00 Copayment per day for days 1-20; $200 Copayment per day for days 21-100; Maximum of 100 days/benefit period Outpatient: 80% coverage; 20% co-insurance Home Health:  100% coverage, limited by medical necessity DME: 80% coverage; 20% co-insurance Providers: in network  SECONDARY:       Policy#:      Phone#:   Development worker, community:       Phone#:   The Engineer, petroleum" for patients in Inpatient Rehabilitation Facilities with attached "Privacy Act [place] Records" was provided and verbally reviewed with: Patient and Family  Emergency Contact Information Contact Information     Name Relation Home Work Mobile   [place] Granddaughter [phone]  [phone]   Merlene Laughter [phone]  [place] Granddaughter [phone]  [phone]       Current Medical History  Patient Admitting Diagnosis: Bilateral femur fx, s/p ORIF R femur fx and ORIF L femur fx History of Present Illness: Gabriela [name] is a 88 y.o. female with medical history significant of DM; stage 4 CKD; and pancytopenia presenting with a fall.  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  She was treated with Cefepime -> Cipro and discharged to home with home therapy services.  She has been doing alright and was getting around well.  She was doing PT at home Tuesday {TA and her left leg (her "good leg) gave out on her.  She fell onto the concrete floor.  She is still taking Cipro for suppressive therapy, 8-9 pills left; the doctor at [facility] switched her antibiotic.  ID has followed her since 2016 for recurrent infections, Dr. Delaine Lame Patient admitted to [facility] 05/26/22 and found to have a communicated fracture of the distal femoral metaphysis of the right leg as well as L femur fracture.   Dr. Marcelino Scot orthopedics consulted at [facility] due to the complexity of case.  Of note patient's hemoglobin was proximately 1 g lower than prior at 7.3 and platelet count 57.  No reports of bleeding. Pt. Transferred to [facility] for repair. He underwent ORIF of R femur fx and ORIF of L femur fx on 10/12. PT/OT consulted and recommended CIR to assist return to PLOF.      Patient's medical record from [facility] has been reviewed by the rehabilitation admission coordinator and physician.  Past Medical History  Past Medical History:  Diagnosis Date   Arthritis    Diabetes mellitus ([place])    Glaucoma    Hyperlipidemia    Hypertension    Shortness of breath  with exertion    Has the patient had major surgery during 100 days prior to admission? Yes  Family History   family history includes Diabetes in her sister; Hypertension in her mother.  Current Medications  Current Facility-Administered Medications:    acetaminophen (TYLENOL) tablet 650 mg, 650 mg, Oral, Q8H, Ainsley Spinner, PA-C, 650 mg at 06/02/22 0851   alum & mag hydroxide-simeth (MAALOX/MYLANTA) 200-200-20 MG/5ML suspension 15 mL, 15 mL, Oral, Once, Kathryne Eriksson, NP   aspirin EC tablet 81 mg, 81 mg, Oral, Daily, Ainsley Spinner, PA-C, 81 mg at 06/02/22 1007   bisacodyl (DULCOLAX) EC tablet 5 mg, 5 mg, Oral,  Daily PRN, Ainsley Spinner, PA-C   brinzolamide (AZOPT) 1 % ophthalmic suspension 1 drop, 1 drop, Both Eyes, BID, 1 drop at 06/02/22 0856 **AND** brimonidine (ALPHAGAN) 0.2 % ophthalmic solution 1 drop, 1 drop, Both Eyes, BID, Pham, Minh Q, RPH-CPP, 1 drop at 06/02/22 0857   calcium carbonate (TUMS - dosed in mg elemental calcium) chewable tablet 200 mg of elemental calcium, 1 tablet, Oral, TID PRN, Caren Griffins, MD   docusate sodium (COLACE) capsule 100 mg, 100 mg, Oral, BID, Ainsley Spinner, PA-C, 100 mg at 06/01/22 1000   feeding supplement (GLUCERNA SHAKE) (GLUCERNA SHAKE) liquid 237 mL, 237 mL, Oral, TID BM, Gherghe, Costin M, MD, 237 mL at 06/02/22 0855   insulin aspart (novoLOG) injection 0-5 Units, 0-5 Units, Subcutaneous, QHS, Gherghe, Costin M, MD   insulin aspart (novoLOG) injection 0-9 Units, 0-9 Units, Subcutaneous, TID WC, Caren Griffins, MD, 1 Units at 05/30/22 1736   methocarbamol (ROBAXIN) tablet 500 mg, 500 mg, Oral, Q6H PRN, 500 mg at 05/31/22 0903 **OR** methocarbamol (ROBAXIN) 500 mg in dextrose 5 % 50 mL IVPB, 500 mg, Intravenous, Q6H PRN, Ainsley Spinner, PA-C   metoprolol tartrate (LOPRESSOR) tablet 12.5 mg, 12.5 mg, Oral, Daily, Ainsley Spinner, PA-C, 12.5 mg at 06/02/22 1219   morphine (PF) 2 MG/ML injection 2 mg, 2 mg, Intravenous, Q2H PRN, Ainsley Spinner, PA-C, 2 mg at 05/31/22 1035   multivitamin with minerals tablet 1 tablet, 1 tablet, Oral, Daily, Caren Griffins, MD, 1 tablet at 06/02/22 0852   ondansetron (ZOFRAN) injection 4 mg, 4 mg, Intravenous, Q6H PRN, Ainsley Spinner, PA-C   oxyCODONE (Oxy IR/ROXICODONE) immediate release tablet 5 mg, 5 mg, Oral, Q4H PRN, Ainsley Spinner, PA-C, 5 mg at 06/02/22 0851   polyethylene glycol (MIRALAX / GLYCOLAX) packet 17 g, 17 g, Oral, BID, Gherghe, Costin M, MD, 17 g at 06/01/22 1003   timolol (TIMOPTIC) 0.5 % ophthalmic solution 1 drop, 1 drop, Both Eyes, BID, Ainsley Spinner, PA-C, 1 drop at 06/02/22 7588   Vitamin D (Ergocalciferol) (DRISDOL) 1.25  MG (50000 UNIT) capsule 50,000 Units, 50,000 Units, Oral, Q7 days, McClung, Sarah A, PA-C  Patients Current Diet:  Diet Order             Diet - low sodium heart healthy           Diet Carb Modified Fluid consistency: Thin; Room service appropriate? Yes  Diet effective now                   Precautions / Restrictions Precautions Precautions: Fall Precaution Comments: HOH, glaucoma Restrictions Weight Bearing Restrictions: Yes RLE Weight Bearing: Weight bearing as tolerated LLE Weight Bearing: Weight bearing as tolerated Other Position/Activity Restrictions: Transfers only   Has the patient had 2 or more falls or a fall with injury in the past year? Yes  Prior  Activity Level Community (5-7x/wk): Pt went out regularly  Prior Functional Level Self Care: Did the patient need help bathing, dressing, using the toilet or eating? Independent  Indoor Mobility: Did the patient need assistance with walking from room to room (with or without device)? Independent  Stairs: Did the patient need assistance with internal or external stairs (with or without device)? Independent  Functional Cognition: Did the patient need help planning regular tasks such as shopping or remembering to take medications? Needed some help  Patient Information Are you of Hispanic, Latino/a,or Spanish origin?: A. No, not of Hispanic, Latino/a, or Spanish origin What is your race?: B. Black or African American Do you need or want an interpreter to communicate with a doctor or health care staff?: 0. No  Patient's Response To:  Health Literacy and Transportation Is the patient able to respond to health literacy and transportation needs?: Yes Health Literacy - How often do you need to have someone help you when you read instructions, pamphlets, or other written material from your doctor or pharmacy?: Sometimes In the past 12 months, has lack of transportation kept you from medical appointments or from getting  medications?: No In the past 12 months, has lack of transportation kept you from meetings, work, or from getting things needed for daily living?: No  Home Assistive Devices / [place] Devices/Equipment: Environmental consultant (specify type) Home Equipment: Conservation officer, nature (2 wheels), Rollator (4 wheels), Transport chair  Prior Device Use: Indicate devices/aids used by the patient prior to current illness, exacerbation or injury? None of the above  Current Functional Level Cognition  Overall Cognitive Status: Within Functional Limits for tasks assessed Orientation Level: Oriented X4 General Comments: Family endorses she is cognitively at baseline    Extremity Assessment (includes Sensation/Coordination)  Upper Extremity Assessment: Generalized weakness  Lower Extremity Assessment: Defer to PT evaluation RLE Deficits / Details: chronic wound to R upper thigh; limited ROM due to recent sx and pain. Difficult to fully assess strength due to pain LLE Deficits / Details: Limited ROM due to recent sx and pain; unable to fully assess due to pain    ADLs  Overall ADL's : Needs assistance/impaired Eating/Feeding: Set up Grooming: Set up Upper Body Bathing: Minimal assistance, Sitting Lower Body Bathing: Total assistance, +2 for physical assistance, Sit to/from stand Upper Body Dressing : Minimal assistance, Sitting Lower Body Dressing: Total assistance, +2 for physical assistance, Sit to/from stand Toilet Transfer:  (unable) Toileting- Clothing Manipulation and Hygiene: +2 for safety/equipment, Total assistance General ADL Comments: Unable to come to full stand- max +2 from EOB. Lateral scoot with max A    Mobility  Overal bed mobility: Needs Assistance Bed Mobility: Supine to Sit Supine to sit: Min assist, Mod assist Sit to supine: Mod assist, Max assist, +2 for physical assistance General bed mobility comments: Got up to R side of bed today, which pt is more familiar with; very nice  initiation, moved bil les to EOB without assist and used top bedrail on her R with 2 hands to turn torso and elevate trunk to sit; Mod assist and use of bed pad to square off hips at EOB    Transfers  Overall transfer level: Needs assistance Equipment used:  (bed pad) Transfers: Bed to chair/wheelchair/BSC Sit to Stand: Max assist, +2 physical assistance Bed to/from chair/wheelchair/BSC transfer type:: Lateral/scoot transfer  Lateral/Scoot Transfers: Mod assist, +2 safety/equipment, Max assist Transfer via Lift Equipment: St[place] General transfer comment: heavy mod assist and bed pad to cradle hips  during scoot transfer towards her L; Demonstrating good weight shift anteriorly, and incr weight acceptance of bil feet on floor; Max assist to scoot hips completely to center of recliner, and back of the seat for optimal positioning    Ambulation / Gait / Stairs / Wheelchair Mobility       Posture / Balance Balance Overall balance assessment: Needs assistance Sitting-balance support: No upper extremity supported, Feet supported Sitting balance-Leahy Scale: Fair Standing balance support: During functional activity, Bilateral upper extremity supported Standing balance-Leahy Scale: Zero    Special needs/care consideration Diabetic Management: novoLOG 0-5 units daily at bedtime; novoLOG 0-9 units 3x daily with meals; Bladder incontinence; External Urinary catheter; Surgical incision: thigh; Wound- thigh/right, anterior, proximal   Previous Home Environment (from acute therapy documentation) Living Arrangements: Alone  Lives With: Spouse Available Help at Discharge: Family, Available PRN/intermittently Type of Home: House Home Layout: One level Home Access: Level entry Bathroom Shower/Tub: Chiropodist: Standard Bathroom Accessibility: Yes How Accessible: Accessible via walker, Accessible via wheelchair [place]: Yes Type of Home Care Services: Homehealth aide,  Home PT, Home RN, [place] (if known): [place] Additional Comments: Two supportive family members willing to provide 24/7 if needed  Discharge Living Setting Plans for Discharge Living Setting: Patient's home Type of Home at Discharge: House Discharge Home Layout: One level Discharge Home Access: Level entry Discharge Bathroom Shower/Tub: [place] unit Discharge Bathroom Toilet: Standard Discharge Bathroom Accessibility: Yes How Accessible: Accessible via wheelchair, Accessible via walker Does the patient have any problems obtaining your medications?: No  Social/Family/Support Systems Patient Roles: Other (Comment) Contact Information: [phone] Anticipated Caregiver: 24/7 Ability/Limitations of Caregiver: Min A Caregiver Availability: 24/7 Discharge Plan Discussed with Primary Caregiver: Yes Is Caregiver In Agreement with Plan?: Yes Does Caregiver/Family have Issues with Lodging/Transportation while Pt is in Rehab?: No  Goals Patient/Family Goal for Rehab: PT/OT MIN A Expected length of stay: 18-21 days Pt/Family Agrees to Admission and willing to participate: Yes Program Orientation Provided & Reviewed with Pt/Caregiver Including Roles  & Responsibilities: Yes  Decrease burden of Care through IP rehab admission:n/a   Possible need for SNF placement upon discharge: not anticipated   Patient Condition: I have reviewed medical records from [facility] [place] , spoken with CM, and patient and family member. I met with patient at the bedside for inpatient rehabilitation assessment.  Patient will benefit from ongoing PT and OT, can actively participate in 3 hours of therapy a day 5 days of the week, and can make measurable gains during the admission.  Patient will also benefit from the coordinated team approach during an Inpatient Acute Rehabilitation admission.  The patient will receive intensive therapy as well as Rehabilitation  physician, nursing, social worker, and care management interventions.  Due to bladder management, bowel management, safety, skin/wound care, disease management, medication administration, pain management, and patient education the patient requires 24 hour a day rehabilitation nursing.  The patient is currently Mod-Max A  with mobility and basic ADLs.  Discharge setting and therapy post discharge at home with home health is anticipated.  Patient has agreed to participate in the Acute Inpatient Rehabilitation Program and will admit today.  Preadmission Screen Completed By:  Genella Mech, with updates by Gayland Curry 06/02/2022 10:31 AM ______________________________________________________________________   Discussed status with Dr. Tressa Busman on 06/02/22 at 10:31 AM and received approval for admission today.  Admission Coordinator:  Genella Mech, CCC-SLP, with updates by Mallie Snooks  Rowe Clack, MS, CCC-SLP time 10:31 AM/Date 06/02/22    Assessment/Plan: Diagnosis: Does the need for close, 24 hr/day Medical supervision in concert with the patient's rehab needs make it unreasonable for this patient to be served in a less intensive setting? Yes Co-Morbidities requiring supervision/potential complications: Post-op wound monitorring, pain control, CKD stage IV, hypokalemia, anemia, artherosclerotic heart disease s/p CABG Due to bladder management, bowel management, safety, skin/wound care, disease management, medication administration, pain management, and patient education, does the patient require 24 hr/day rehab nursing? Yes Does the patient require coordinated care of a physician, rehab nurse, PT, OT  to address physical and functional deficits in the context of the above medical diagnosis(es)? Yes Addressing deficits in the following areas: balance, endurance, locomotion, strength, transferring, bowel/bladder control, bathing, dressing, feeding, grooming, and toileting Can the patient actively  participate in an intensive therapy program of at least 3 hrs of therapy 5 days a week? Yes The potential for patient to make measurable gains while on inpatient rehab is excellent Anticipated functional outcomes upon discharge from inpatient rehab: Min A PT, Min A OT Estimated rehab length of stay to reach the above functional goals is: 18-21 days Anticipated discharge destination: Home 10. Overall Rehab/Functional Prognosis: excellent   MD Signature:  Gertie Gowda, DO 06/02/2022

## 2022-05-30 NOTE — Progress Notes (Addendum)
Orthopaedic Trauma Service Progress Note  Patient ID: Gabriela [name] MRN: [id number] DOB/AGE: 1926-03-29 88 y.o.  Subjective:  No acute issues this am  Sat in chair for a few hours yesterday  Pain tolerable No R hip complaints   Granddaughter at bedside    ROS As above  Objective:   VITALS:   Vitals:   05/29/22 1658 05/29/22 1920 05/30/22 0431 05/30/22 0733  BP: (!) 103/52 121/60 (!) 169/67 (!) 140/68  Pulse: 67 67 81 71  Resp: '20 17  19  '$ Temp: 97.9 F (36.6 C) 98 F (36.7 C) 98 F (36.7 C) 98 F (36.7 C)  TempSrc:   Oral   SpO2: 100% 100% 100% 100%  Weight:      Height:        Estimated body mass index is 21.19 kg/m as calculated from the following:   Height as of this encounter: '5\' 4"'$  (1.626 m).   Weight as of this encounter: 56 kg.   Intake/Output      10/15 0701 10/16 0700 10/16 0701 10/17 0700   I.V. (mL/kg) 729.2 (13)    Blood 366    Total Intake(mL/kg) 1095.2 (19.6)    Urine (mL/kg/hr) 300 (0.2)    Total Output 300    Net +795.2         Urine Occurrence 2 x      LABS  Results for orders placed or performed during the hospital encounter of 05/26/22 (from the past 24 hour(s))  Prepare RBC (crossmatch)     Status: None   Collection Time: 05/29/22  9:47 AM  Result Value Ref Range   Order Confirmation      ORDER PROCESSED BY BLOOD BANK Performed at [facility], [place] [address], [place], [place] [address]   Glucose, capillary     Status: Abnormal   Collection Time: 05/29/22 11:39 AM  Result Value Ref Range   Glucose-Capillary 209 (H) 70 - 99 mg/dL  Glucose, capillary     Status: Abnormal   Collection Time: 05/29/22  5:00 PM  Result Value Ref Range   Glucose-Capillary 196 (H) 70 - 99 mg/dL  Glucose, capillary     Status: Abnormal   Collection Time: 05/29/22  7:21 PM  Result Value Ref Range   Glucose-Capillary 162 (H) 70 - 99 mg/dL  Comprehensive metabolic  panel     Status: Abnormal   Collection Time: 05/30/22  2:50 AM  Result Value Ref Range   Sodium 136 135 - 145 mmol/L   Potassium 4.6 3.5 - 5.1 mmol/L   Chloride 107 98 - 111 mmol/L   CO2 19 (L) 22 - 32 mmol/L   Glucose, Bld 161 (H) 70 - 99 mg/dL   BUN 68 (H) 8 - 23 mg/dL   Creatinine, Ser 1.82 (H) 0.44 - 1.00 mg/dL   Calcium 8.0 (L) 8.9 - 10.3 mg/dL   Total Protein 6.6 6.5 - 8.1 g/dL   Albumin 2.0 (L) 3.5 - 5.0 g/dL   AST 36 15 - 41 U/L   ALT 6 0 - 44 U/L   Alkaline Phosphatase 126 38 - 126 U/L   Total Bilirubin 1.4 (H) 0.3 - 1.2 mg/dL   GFR, Estimated 25 (L) >60 mL/min   Anion gap 10 5 - 15  CBC  Status: Abnormal   Collection Time: 05/30/22  2:50 AM  Result Value Ref Range   WBC 12.9 (H) 4.0 - 10.5 K/uL   RBC 3.26 (L) 3.87 - 5.11 MIL/uL   Hemoglobin 8.9 (L) 12.0 - 15.0 g/dL   HCT 26.5 (L) 36.0 - 46.0 %   MCV 81.3 80.0 - 100.0 fL   MCH 27.3 26.0 - 34.0 pg   MCHC 33.6 30.0 - 36.0 g/dL   RDW 18.6 (H) 11.5 - 15.5 %   Platelets 47 (L) 150 - 400 K/uL   nRBC 0.0 0.0 - 0.2 %  Magnesium     Status: None   Collection Time: 05/30/22  2:50 AM  Result Value Ref Range   Magnesium 1.9 1.7 - 2.4 mg/dL  Glucose, capillary     Status: Abnormal   Collection Time: 05/30/22  7:35 AM  Result Value Ref Range   Glucose-Capillary 139 (H) 70 - 99 mg/dL     PHYSICAL EXAM:   Gen: awake, sitting up in bed, pleasant, NAD  Lungs:  unlabored  Cardiac: s1 and s2 Ext:       B Lower Extremities   Baseline knee flexion contractures and internal rotation of R hip   Ext warm  + DP pulses B   Dressings removed   All surgical wounds are stable   No signs of infection    No DCT  Swelling well controlled  Motor and sensory functions intact   Assessment/Plan: 4 Days Post-Op   Principal Problem:   Femur fracture, right (HCC) Active Problems:   Type 2 diabetes mellitus with renal manifestations (HCC)   Hyperlipidemia   CAD (coronary artery disease)   Glaucoma   Chronic wound    Thrombocytopenia (HCC)   MGUS (monoclonal gammopathy of unknown significance)   Femur fracture, left (HCC)   CKD (chronic kidney disease) stage 4, GFR 15-29 ml/min (HCC)   Fever   Leukocytosis   Anti-infectives (From admission, onward)    Start     Dose/Rate Route Frequency Ordered Stop   05/27/22 1215  ciprofloxacin (CIPRO) tablet 500 mg  Status:  Discontinued        500 mg Oral Daily with breakfast 05/27/22 1122 05/27/22 1122   05/27/22 1215  ciprofloxacin (CIPRO) tablet 500 mg  Status:  Discontinued        500 mg Oral Daily with breakfast 05/27/22 1122 05/27/22 1125   05/27/22 0500  ceFAZolin (ANCEF) IVPB 1 g/50 mL premix        1 g 100 mL/hr over 30 Minutes Intravenous Every 12 hours 05/26/22 2133 05/27/22 1757   05/26/22 1700  ceFAZolin (ANCEF) IVPB 2g/100 mL premix        2 g 200 mL/hr over 30 Minutes Intravenous On call to O.R. 05/26/22 1259 05/27/22 0559     .  POD/HD#: 38    88 year old female with bilateral distal femur fragility fractures, chronic right thigh wound with osteomyelitis versus osteolysis of THA   -Bilateral distal femur fragility fractures s/p ORIF             Weight-bear as tolerated for transfers only             unrestricted range of motion bilateral LExs   Do not place pillows under knees at rest    Place under ankles               PT and OT evaluations  Suspect patient will need SNF               Ice and elevate as needed             Dressing changes as needed   Ok to leave wounds open to the air    Clean ALL wounds with soap and water only    Anterior R thigh wound can be dressed with aquacel dressing or mepilex   PT- please teach HEP for B knee ROM- AROM, PROM. Prone exercises as well. No ROM restrictions.  Quad sets, SLR, LAQ, SAQ, heel slides, stretching  Ankle theraband program, heel cord stretching, toe towel curls, etc  No pillows under bend of knee when at rest, ok to place under heel to help work on  extension. Can also use zero knee bone foam if available   -Chronic right thigh wound with chronic osteomyelitis versus osteolysis of the total hip arthroplasty            continue with expectant management   Fluid collection is chronic and appears unchanged   Some of the wound issues were likely related to the family being instructed to clean it with betadine. Although betadine does kill local bacteria it also kills new skin cells. Recommend soap and water only from this point forward    - Pain management:             Minimize narcotics             Multimodal   - ABL anemia/Hemodynamics             monitor              Chronic thrombocytopenia   - Medical issues              Per primary   Will order air mattress overlay   Decubitus precautions   - DVT/PE prophylaxis:             lovenox dc'd due to plts   On low dose asa (home med) and SCDs  - ID:              Perioperative antibiotics             Defer additional antibiotics to ID   - Metabolic Bone Disease:             Fragility fracture bilateral femurs = osteoporosis             Vitamin D levels are pending - Activity:             Weight-bear as tolerated bilateral lower extremities for transfers                - Impediments to fracture healing:             Osteoporosis   - Dispo:             Ortho issues are stable             Therapies  Pt and family hopeful for CIR               Follow-up with orthopedics in 2 weeks                         Sutures out around 06/09/2022   Jari Pigg, PA-C [phone] (C) 05/30/2022, 9:35 AM  [facility] [place] [address] [phone] Domingo Sep (F)  After 5pm and on the weekends please log on to Amion, go to orthopaedics and the look under the [facility] Call for the provider(s) on call. You can also call our office at [phone] and then follow the prompts to be connected to the call team.   Patient  ID: Gabriela [name], female   DOB: 10-06-1925, 88 y.o.   MRN: [id number]

## 2022-05-30 NOTE — Telephone Encounter (Signed)
Requested medications are due for refill today.  yes  Requested medications are on the active medications list.  yes  Last refill. 02/12/2022 #90 1 rf  Future visit scheduled.   yes  Notes to clinic.  Abnormal labs    Requested Prescriptions  Pending Prescriptions Disp Refills   furosemide (LASIX) 20 MG tablet [Pharmacy Med Name: FUROSEMIDE 20 MG TABLET] 90 tablet 0    Sig: TAKE 1 TABLET BY MOUTH EVERY DAY     Cardiovascular:  Diuretics - Loop Failed - 05/30/2022  1:26 AM      Failed - Ca in normal range and within 180 days    Calcium  Date Value Ref Range Status  05/30/2022 8.0 (L) 8.9 - 10.3 mg/dL Final   Calcium, Ion  Date Value Ref Range Status  06/01/2013 1.28 1.13 - 1.30 mmol/L Final         Failed - Na in normal range and within 180 days    Sodium  Date Value Ref Range Status  05/30/2022 136 135 - 145 mmol/L Final  05/11/2022 142 134 - 144 mmol/L Final         Failed - Cr in normal range and within 180 days    Creat  Date Value Ref Range Status  06/13/2017 1.33 (H) 0.60 - 0.88 mg/dL Final    Comment:    For patients >51 years of age, the reference limit for Creatinine is approximately 13% higher for people identified as African-American. .    Creatinine, Ser  Date Value Ref Range Status  05/30/2022 1.82 (H) 0.44 - 1.00 mg/dL Final         Passed - K in normal range and within 180 days    Potassium  Date Value Ref Range Status  05/30/2022 4.6 3.5 - 5.1 mmol/L Final         Passed - Cl in normal range and within 180 days    Chloride  Date Value Ref Range Status  05/30/2022 107 98 - 111 mmol/L Final         Passed - Mg Level in normal range and within 180 days    Magnesium  Date Value Ref Range Status  05/30/2022 1.9 1.7 - 2.4 mg/dL Final    Comment:    Performed at [facility], [place] [address], [place], [place] [address]         Passed - Last BP in normal range    BP Readings from Last 1 Encounters:  05/30/22 125/61         Passed  - Valid encounter within last 6 months    Recent Outpatient Visits           2 weeks ago Non-healing non-surgical wound   [place], [place], [place]   1 month ago Hospital discharge follow-up   [place], [place], [place]   2 months ago Bilateral carpal tunnel syndrome   [facility] Jerrol Banana., MD   5 months ago Diabetes mellitus without complication [facility])   [facility] Jerrol Banana., MD   1 year ago Type 2 diabetes mellitus with other circulatory complication, unspecified whether long term insulin use [facility])   [facility] Jerrol Banana., MD       Future Appointments             In 2 months Simmons-Robinson, Riki Sheer, MD [facility], [place]

## 2022-05-30 NOTE — Progress Notes (Signed)
Inpatient Rehab Admissions Coordinator:   I continue to await insurance auth for CIR. I will follow for potential admit pending insurance auth. Family updated   Clemens Catholic, [place], [place] Admissions Coordinator  [phone] ([place]) [phone] (office)

## 2022-05-30 NOTE — Progress Notes (Signed)
PROGRESS NOTE  Gabriela [name] HCW:[id number] DOB: 01-30-1926 DOA: 05/26/2022 PCP: [name], MD   LOS: 4 days   Brief Narrative / Interim history: 88 year old female with DM2, CKD 4, chronic pancytopenia comes into the hospital with a fall.  She was in her home, working with physical therapy, with a walker, all of a sudden her legs gave out and fell on her knees.  She presented to [facility] ER and was diagnosed with bilateral femur fractures.  Due to complexity of the fractures she was transferred to [facility].  She is status post operative repair on 10/12.  Subjective / 24h Interval events: Eating breakfast today.  No chest pain, no shortness of breath.  Overall feeling very well  Assesement and Plan: Principal Problem:   Femur fracture, right (HCC) Active Problems:   Chronic wound   Type 2 diabetes mellitus with renal manifestations (HCC)   Thrombocytopenia (HCC)   CAD (coronary artery disease)   Hyperlipidemia   Glaucoma   MGUS (monoclonal gammopathy of unknown significance)   Femur fracture, left (HCC)   CKD (chronic kidney disease) stage 4, GFR 15-29 ml/min (HCC)   Fever   Leukocytosis   Principal problem Right supracondylar femur fracture below THA, left supracondylar femur fracture with intra-articular extension-orthopedic surgery consulted, she was taken to the OR on 10/12 and she is status post ORIF right and left using [employer] NCB plate.  She is weightbearing as tolerated, DVT prophylaxis with Lovenox -PT recommends CIR, consult placed, insurance authorization started.  Active problems Right thigh wound with Pseudomonas infection, chronic osteomyelitis surrounding the acetabular cup-admitted for this in September.  ID was consulted and recommending ciprofloxacin 500 mg daily for 6 weeks up until 05/27/2022.  -ID consulted, CT scan of the hip done per ID, there is of chronic fluid collection there.  ID does not recommend any antibiotics  currently  CKD 4-baseline creatinine ranging anywhere from 1.3-1.7.  Creatinine was up to 2.1 yesterday, likely dehydrated, return to baseline after fluids and is 1.8 this morning  Hypokalemia-potassium remains normal  History of CABG-no chest pain, stable  Glaucoma-continue home medications  Anemia of chronic disease-status post unit of packed red blood cells on 10/15, improved appropriately and stable at 8.9 this morning  Thrombocytopenia-associated with MGUS, did require platelet transfusion while at [facility].  Platelets improving on their own  Type 2 diabetes mellitus-continue sliding scale  CBG (last 3)  Recent Labs    05/29/22 1700 05/29/22 1921 05/30/22 0735  GLUCAP 196* 162* 139*     Scheduled Meds:  acetaminophen  650 mg Oral Q8H   alum & mag hydroxide-simeth  15 mL Oral Once   aspirin EC  81 mg Oral Daily   brinzolamide  1 drop Both Eyes BID   And   brimonidine  1 drop Both Eyes BID   docusate sodium  100 mg Oral BID   feeding supplement (GLUCERNA SHAKE)  237 mL Oral TID BM   insulin aspart  0-5 Units Subcutaneous QHS   insulin aspart  0-9 Units Subcutaneous TID WC   metoprolol tartrate  12.5 mg Oral Daily   multivitamin with minerals  1 tablet Oral Daily   mupirocin ointment  1 Application Nasal BID   polyethylene glycol  17 g Oral BID   timolol  1 drop Both Eyes BID   Vitamin D (Ergocalciferol)  50,000 Units Oral Q7 days   Continuous Infusions:  methocarbamol (ROBAXIN) IV     PRN Meds:.bisacodyl, calcium carbonate, methocarbamol **OR**  methocarbamol (ROBAXIN) IV, morphine injection, ondansetron (ZOFRAN) IV, oxyCODONE  Current Outpatient Medications  Medication Instructions   Accu-Chek FastClix Lancets MISC To check blood sugars once a day. DX E11.9   albuterol (VENTOLIN HFA) 108 (90 Base) MCG/ACT inhaler INHALE 1-2 PUFFS INTO THE LUNGS EVERY 4 HOURS AS NEEDED FOR WHEEZING OR SHORTNESS OF BREATH.   aspirin EC 81 mg, Oral, Daily   Blood Glucose Monitoring  Suppl (ACCU-CHEK AVIVA PLUS) w/Device KIT 1 each, Does not apply, Daily, Dx E11.9   ciprofloxacin (CIPRO) 500 mg, Oral, Daily-1800   cyanocobalamin 500 mcg, Oral, Daily   ferrous sulfate 325 mg, Oral, Daily with breakfast, TAKE 1 TABLET BY MOUTH DAILY.   furosemide (LASIX) 20 MG tablet TAKE 1 TABLET BY MOUTH EVERY DAY   glucose blood (ACCU-CHEK AVIVA PLUS) test strip USE AS INSTRUCTED DX E11.9   metoprolol tartrate (LOPRESSOR) 25 MG tablet TAKE 1/2 TABLET BY MOUTH EVERY DAY   SIMBRINZA 1-0.2 % SUSP 1 drop, Both Eyes, 2 times daily   timolol (TIMOPTIC) 0.5 % ophthalmic solution 1 drop, Both Eyes, 2 times daily    Diet Orders (From admission, onward)     Start     Ordered   05/27/22 0539  Diet Carb Modified Fluid consistency: Thin; Room service appropriate? Yes  Diet effective now       Question Answer Comment  Diet-HS Snack? Nothing   Calorie Level Medium 1600-2000   Fluid consistency: Thin   Room service appropriate? Yes      05/27/22 0539            DVT prophylaxis: SCDs Start: 05/26/22 1324   Lab Results  Component Value Date   PLT 47 (L) 05/30/2022      Code Status: Full Code  Family Communication: Granddaughter at bedside  Status is: Inpatient  Remains inpatient appropriate because: Awaiting CIR   Level of care: Med-Surg  Consultants:  ID Orthopedic surgery  Objective: Vitals:   05/29/22 1658 05/29/22 1920 05/30/22 0431 05/30/22 0733  BP: (!) 103/52 121/60 (!) 169/67 (!) 140/68  Pulse: 67 67 81 71  Resp: '20 17  19  ' Temp: 97.9 F (36.6 C) 98 F (36.7 C) 98 F (36.7 C) 98 F (36.7 C)  TempSrc:   Oral   SpO2: 100% 100% 100% 100%  Weight:      Height:        Intake/Output Summary (Last 24 hours) at 05/30/2022 1054 Last data filed at 05/29/2022 2019 Gross per 24 hour  Intake 1095.17 ml  Output 300 ml  Net 795.17 ml    Wt Readings from Last 3 Encounters:  05/26/22 56 kg  05/11/22 56.7 kg  04/17/22 54.9 kg     Examination:  Constitutional: NAD Eyes: lids and conjunctivae normal, no scleral icterus ENMT: mmm Neck: normal, supple Respiratory: clear to auscultation bilaterally, no wheezing, no crackles. Normal respiratory effort.  Cardiovascular: Regular rate and rhythm, no murmurs / rubs / gallops. No LE edema. Abdomen: soft, no distention, no tenderness. Bowel sounds positive.  Skin: no rashes Neurologic: no focal deficits, equal strength  Data Reviewed: I have independently reviewed following labs and imaging studies   CBC Recent Labs  Lab 05/24/22 1123 05/24/22 1959 05/26/22 7106 05/27/22 0521 05/28/22 0245 05/29/22 0512 05/30/22 0250  WBC 4.6  --  19.1* 16.6* 21.2* 15.9* 12.9*  HGB 7.3*   < > 10.3* 9.9* 8.5* 7.5* 8.9*  HCT 23.4*   < > 31.0* 29.1* 25.3* 22.9* 26.5*  PLT 57*  --  69* 50* 43* 42* 47*  MCV 81.3  --  82.2 82.0 81.9 83.6 81.3  MCH 25.3*  --  27.3 27.9 27.5 27.4 27.3  MCHC 31.2  --  33.2 34.0 33.6 32.8 33.6  RDW 18.7*  --  18.0* 18.3* 18.5* 18.8* 18.6*  LYMPHSABS 1.6  --  1.6  --   --   --   --   MONOABS 1.0  --  9.1*  --   --   --   --   EOSABS 0.0  --  0.0  --   --   --   --   BASOSABS 0.0  --  0.1  --   --   --   --    < > = values in this interval not displayed.     Recent Labs  Lab 05/24/22 1123 05/26/22 0648 05/27/22 0521 05/28/22 0245 05/29/22 0512 05/30/22 0250  NA 138 138 136 138 133* 136  K 3.0* 3.1* 3.3* 4.2 4.4 4.6  CL 107 108 104 108 105 107  CO2 20* 20* 20* 20* 17* 19*  GLUCOSE 222* 243* 196* 112* 199* 161*  BUN 50* 42* 41* 50* 63* 68*  CREATININE 1.76* 1.54* 1.59* 2.08* 2.14* 1.82*  CALCIUM 7.7* 7.8* 8.0* 8.1* 8.0* 8.0*  AST 19 24  --  23 37 36  ALT 9 7  --  '6 7 6  ' ALKPHOS 91 77  --  83 106 126  BILITOT 0.7 1.5*  --  1.5* 1.4* 1.4*  ALBUMIN 3.4* 3.0*  --  2.2* 2.1* 2.0*  MG  --   --   --  1.7 1.8 1.9     ------------------------------------------------------------------------------------------------------------------ No  results for input(s): "CHOL", "HDL", "LDLCALC", "TRIG", "CHOLHDL", "LDLDIRECT" in the last 72 hours.  Lab Results  Component Value Date   HGBA1C 6.2 (H) 12/27/2021   ------------------------------------------------------------------------------------------------------------------ No results for input(s): "TSH", "T4TOTAL", "T3FREE", "THYROIDAB" in the last 72 hours.  Invalid input(s): "FREET3"  Cardiac Enzymes No results for input(s): "CKMB", "TROPONINI", "MYOGLOBIN" in the last 168 hours.  Invalid input(s): "CK" ------------------------------------------------------------------------------------------------------------------ No results found for: "BNP"  CBG: Recent Labs  Lab 05/29/22 0809 05/29/22 1139 05/29/22 1700 05/29/22 1921 05/30/22 0735  GLUCAP 191* 209* 196* 162* 139*     Recent Results (from the past 240 hour(s))  Surgical PCR screen     Status: None   Collection Time: 05/26/22 11:14 AM   Specimen: Nasal Mucosa; Nasal Swab  Result Value Ref Range Status   MRSA, PCR NEGATIVE NEGATIVE Final   Staphylococcus aureus NEGATIVE NEGATIVE Final    Comment: (NOTE) The Xpert SA Assay (FDA approved for NASAL specimens in patients 38 years of age and older), is one component of a comprehensive surveillance program. It is not intended to diagnose infection nor to guide or monitor treatment. Performed at [facility], [place] [address], [place], [place] [address]      Radiology Studies: No results found.   Marzetta Board, MD, PhD [facility]  Between 7 am - 7 pm I am available, please contact me via Amion (for emergencies) or Securechat (non urgent messages)  Between 7 pm - 7 am I am not available, please contact night coverage MD/APP via Amion

## 2022-05-30 NOTE — Progress Notes (Signed)
This chaplain responded to a unit page for creating/updating the Pt. Advance Directive:  HCPOA and Living Will.  The Pt. granddaughter-[name] is present for AD education.  After Pt. education and an opportunity for the Pt. to ask clarifying questions, the chaplain is present with the Pt., [name], notary, and two witnesses for the notarizing of the Pt. AD.  The Pt. named [name] as her healthcare agent. If this person is unwilling or unable to serve in this role the Pt. next choice is Ailene Ravel.  The Pt. completed a Living Will.  The chaplain gave the Pt. the original AD along with two copies.  The chaplain scanned the Pt. AD into her EMR.  This chaplain is available for F/U spiritual care as needed.  Chaplain Sallyanne Kuster [phone]

## 2022-05-30 NOTE — Progress Notes (Signed)
Physical Therapy Treatment Patient Details Name: Gabriela [name] MRN: [id number] DOB: 06-04-1926 Today's Date: 05/30/2022   History of Present Illness 88 y/o female presented to ED on 05/24/22 following fall. Sustained R distal femur fx and L distal femur fx. S/p ORIF of R femur fx and ORIF of L femur fx on 10/12. PMH: diabetes, CKD stage 4, HTN, glaucoma    PT Comments    Continuing work on functional mobility and activity tolerance;  Session focused on functional transfers, and pt stood x3 today to RW and using stedy (standing assist frame); Continues to need bil support with straight sit to stand transfers; Able to tolerate weight through Bil LEs enough to allow for pt to move UEs from armrests of recliner to RW, and work on extending knees with UE support, and support form PT and tech; Then stood to stedy for transfer back to bed; positioned in bed with ankles propped to allow for low load, longer duration knee extension stretching;   Continue to recommend acute inpatient rehab (AIR) for post-acute therapy needs.; We must not forget that she was managing modified independently prior to this fall; Today she fully participated with PT, even in pain (I believe her pain meds were given in the morning); she has supportive family, and pt and family are aware that she will be transfers only until Ortho clears her -- I believe that with intensive rehab (where they are also better able to schedule therapies with pain meds) she will be able to dc home at or very near modified independent wheelchair transfer functional level  Recommendations for follow up therapy are one component of a multi-disciplinary discharge planning process, led by the attending physician.  Recommendations may be updated based on patient status, additional functional criteria and insurance authorization.  Follow Up Recommendations  Acute inpatient rehab (3hours/day)     Assistance Recommended at Discharge Frequent or constant  Supervision/Assistance  Patient can return home with the following A lot of help with walking and/or transfers;A lot of help with bathing/dressing/bathroom;Assistance with cooking/housework;Direct supervision/assist for medications management;Direct supervision/assist for financial management;Assist for transportation;Help with stairs or ramp for entrance   Equipment Recommendations  Wheelchair (measurements PT);Wheelchair cushion (measurements PT);Other (comment) (slideboard; drop-arm BSC)    Recommendations for Other Services Rehab consult     Precautions / Restrictions Precautions Precautions: Fall Restrictions RLE Weight Bearing: Weight bearing as tolerated LLE Weight Bearing: Weight bearing as tolerated Other Position/Activity Restrictions: Transfers only     Mobility  Bed Mobility Overal bed mobility: Needs Assistance Bed Mobility: Sit to Supine       Sit to supine: Mod assist, Max assist, +2 for physical assistance   General bed mobility comments: Mod assist to scoot hips back nearer to center of bed in prep for laying down; Max assist to support Bil LEs lifting into bed as rehab tech helped trunk to supine; Air mattress bed, so needed +2 assist for safety    Transfers Overall transfer level: Needs assistance Equipment used: Rolling walker (2 wheels), Ambulation equipment used Transfers: Sit to/from Stand, Bed to chair/wheelchair/BSC Sit to Stand: Max assist, +2 physical assistance           General transfer comment: Stood from recliner to RW with bil support/assist, and use of bed pad for inital lift; able to tolerate weight through bil LEs long enough to allow for moving hands from armrests to RW; didn't quite get to fully extended hips; stood to stedy with 2 person assist, and able to  shift weight forward enough to clear hips and sit back down to bed Transfer via Lift Equipment: Stedy  Ambulation/Gait                   Stairs              Wheelchair Mobility    Modified Rankin (Stroke Patients Only)       Balance     Sitting balance-Leahy Scale: Fair       Standing balance-Leahy Scale: Zero                              Cognition Arousal/Alertness: Awake/alert Behavior During Therapy: WFL for tasks assessed/performed Overall Cognitive Status: Within Functional Limits for tasks assessed                                 General Comments: Family endorses she is cognitively at baseline        Exercises      General Comments General comments (skin integrity, edema, etc.): [name], granddaughter in room and helpful      Pertinent Vitals/Pain Pain Assessment Pain Assessment: Faces Faces Pain Scale: Hurts even more Pain Location: Bil knees, L more painful than R Pain Descriptors / Indicators: Grimacing, Guarding, Operative site guarding Pain Intervention(s): Monitored during session, Repositioned    Home Living                          Prior Function            PT Goals (current goals can now be found in the care plan section) Acute Rehab PT Goals Patient Stated Goal: back to indepedence PT Goal Formulation: With patient/family Time For Goal Achievement: 06/10/22 Potential to Achieve Goals: Good Progress towards PT goals: Progressing toward goals    Frequency    Min 4X/week      PT Plan Current plan remains appropriate    Co-evaluation              AM-PAC PT "6 Clicks" Mobility   Outcome Measure  Help needed turning from your back to your side while in a flat bed without using bedrails?: A Lot Help needed moving from lying on your back to sitting on the side of a flat bed without using bedrails?: A Lot Help needed moving to and from a bed to a chair (including a wheelchair)?: Total Help needed standing up from a chair using your arms (e.g., wheelchair or bedside chair)?: Total Help needed to walk in hospital room?: Total Help needed  climbing 3-5 steps with a railing? : Total 6 Click Score: 8    End of Session Equipment Utilized During Treatment: Gait belt Activity Tolerance: Patient tolerated treatment well (despite pain) Patient left: in bed;with call bell/phone within reach;with family/visitor present Nurse Communication: Mobility status;Other (comment) (PT done, pt in bed, and liekly needs pain meds) PT Visit Diagnosis: Unsteadiness on feet (R26.81);Muscle weakness (generalized) (M62.81);History of falling (Z91.81);Other abnormalities of gait and mobility (R26.89)     Time: 9563-8756 PT Time Calculation (min) (ACUTE ONLY): 48 min  Charges:  $Therapeutic Activity: 38-52 mins                     Roney Marion, PT  Acute Rehabilitation Services Office [phone]    Colletta Maryland 05/30/2022, 3:08 PM

## 2022-05-31 LAB — BASIC METABOLIC PANEL
Anion gap: 7 (ref 5–15)
BUN: 64 mg/dL — ABNORMAL HIGH (ref 8–23)
CO2: 20 mmol/L — ABNORMAL LOW (ref 22–32)
Calcium: 8.5 mg/dL — ABNORMAL LOW (ref 8.9–10.3)
Chloride: 109 mmol/L (ref 98–111)
Creatinine, Ser: 1.46 mg/dL — ABNORMAL HIGH (ref 0.44–1.00)
GFR, Estimated: 33 mL/min — ABNORMAL LOW (ref >60)
Glucose, Bld: 130 mg/dL — ABNORMAL HIGH (ref 70–99)
Potassium: 5.8 mmol/L — ABNORMAL HIGH (ref 3.5–5.1)
Sodium: 136 mmol/L (ref 135–145)

## 2022-05-31 LAB — GLUCOSE, CAPILLARY
Glucose-Capillary: 100 mg/dL — ABNORMAL HIGH (ref 70–99)
Glucose-Capillary: 155 mg/dL — ABNORMAL HIGH (ref 70–99)
Glucose-Capillary: 173 mg/dL — ABNORMAL HIGH (ref 70–99)
Glucose-Capillary: 122 mg/dL — ABNORMAL HIGH (ref 70–99)

## 2022-05-31 LAB — CBC
HCT: 33.1 % — ABNORMAL LOW (ref 36.0–46.0)
Hemoglobin: 10.8 g/dL — ABNORMAL LOW (ref 12.0–15.0)
MCH: 27.3 pg (ref 26.0–34.0)
MCHC: 32.6 g/dL (ref 30.0–36.0)
MCV: 83.8 fL (ref 80.0–100.0)
Platelets: 44 10*3/uL — ABNORMAL LOW (ref 150–400)
RBC: 3.95 MIL/uL (ref 3.87–5.11)
RDW: 19.1 % — ABNORMAL HIGH (ref 11.5–15.5)
WBC: 9.6 10*3/uL (ref 4.0–10.5)
nRBC: 0 % (ref 0.0–0.2)

## 2022-05-31 MED ORDER — SODIUM ZIRCONIUM CYCLOSILICATE 10 G PO PACK
10.0000 g | PACK | Freq: Once | ORAL | Status: AC
  Administered 2022-05-31: 10 g via ORAL
  Filled 2022-05-31: qty 1

## 2022-05-31 NOTE — Plan of Care (Signed)
  Problem: Education: Goal: Knowledge of General Education information will improve Description: Including pain rating scale, medication(s)/side effects and non-pharmacologic comfort measures Outcome: Progressing   Problem: Clinical Measurements: Goal: Ability to maintain clinical measurements within normal limits will improve Outcome: Progressing Goal: Will remain free from infection Outcome: Progressing   Problem: Activity: Goal: Risk for activity intolerance will decrease Outcome: Progressing   Problem: Nutrition: Goal: Adequate nutrition will be maintained Outcome: Progressing   Problem: Elimination: Goal: Will not experience complications related to bowel motility Outcome: Progressing   Problem: Pain Managment: Goal: General experience of comfort will improve Outcome: Progressing   

## 2022-05-31 NOTE — Progress Notes (Signed)
Inpatient Rehab Admissions Coordinator:    I received a denial from pt.'s insurance for CIR. I notified Pt and family and they wish to file an expedited appeal. I will send that off shortly.  Clemens Catholic, [place], [place] Admissions Coordinator  [phone] ([place]) [phone] (office)

## 2022-05-31 NOTE — Progress Notes (Signed)
PROGRESS NOTE  Gabriela [name]:[id number] DOB: [birth year] DOA: 05/26/2022 PCP: [name], MD   LOS: 5 days   Brief Narrative / Interim history: 88 year old female with DM2, CKD 4, chronic pancytopenia comes into the hospital with a fall.  She was in her home, working with physical therapy, with a walker, all of a sudden her legs gave out and fell on her knees.  She presented to [facility] ER and was diagnosed with bilateral femur fractures.  Due to complexity of the fractures she was transferred to [facility].  She is status post operative repair on 10/12.  Subjective / 24h Interval events: Feels well this morning.  No complaints.  Was able to work with therapy yesterday  Assesement and Plan: Principal Problem:   Femur fracture, right ([place]) Active Problems:   Chronic wound   Type 2 diabetes mellitus with renal manifestations (HCC)   Thrombocytopenia (HCC)   CAD (coronary artery disease)   Hyperlipidemia   Glaucoma   MGUS (monoclonal gammopathy of unknown significance)   Femur fracture, left (HCC)   CKD (chronic kidney disease) stage 4, GFR 15-29 ml/min (HCC)   Fever   Leukocytosis   Principal problem Right supracondylar femur fracture below THA, left supracondylar femur fracture with intra-articular extension-orthopedic surgery consulted, she was taken to the OR on 10/12 and she is status post ORIF right and left using [employer] NCB plate.  She is weightbearing as tolerated, DVT prophylaxis with Lovenox -PT recommends CIR, I fully agree with this recommendation, this was discussed with the peer to peer MD of her insurance company yesterday.  Awaiting final determination.  Active problems Right thigh wound with Pseudomonas infection, chronic osteomyelitis surrounding the acetabular cup-admitted for this in September.  ID was consulted and recommending ciprofloxacin 500 mg daily for 6 weeks up until 05/27/2022.  -ID consulted, CT scan of the hip done per ID,  there is of chronic fluid collection there.  ID does not recommend any antibiotics currently  CKD 4-baseline creatinine ranging anywhere from 1.3-1.7.  Creatinine was up to 2.1 on 10/16, likely dehydrated, after fluids returned to baseline  Hypokalemia-potassium now elevated  Hyperkalemia-K is up to 5.8 this morning.  This is likely dietary from her Glucerna/juice brought from home/banana.  Discussed with the family to limit potassium containing foods.  We will give Lokelma x1 today  History of CABG-no chest pain, stable  Glaucoma-continue home medications  Anemia of chronic disease-status post unit of packed red blood cells on 10/15, improved appropriately, now improving on its own, 10.8 this morning  Thrombocytopenia-associated with MGUS, did require platelet transfusion while at [facility].  Platelet overall stable  Type 2 diabetes mellitus-continue sliding scale  CBG (last 3)  Recent Labs    05/30/22 1607 05/30/22 1921 05/31/22 0802  GLUCAP 210* 171* 100*     Scheduled Meds:  acetaminophen  650 mg Oral Q8H   alum & mag hydroxide-simeth  15 mL Oral Once   aspirin EC  81 mg Oral Daily   brinzolamide  1 drop Both Eyes BID   And   brimonidine  1 drop Both Eyes BID   docusate sodium  100 mg Oral BID   feeding supplement (GLUCERNA SHAKE)  237 mL Oral TID BM   insulin aspart  0-5 Units Subcutaneous QHS   insulin aspart  0-9 Units Subcutaneous TID WC   metoprolol tartrate  12.5 mg Oral Daily   multivitamin with minerals  1 tablet Oral Daily   polyethylene glycol  17  g Oral BID   timolol  1 drop Both Eyes BID   Vitamin D (Ergocalciferol)  50,000 Units Oral Q7 days   Continuous Infusions:  methocarbamol (ROBAXIN) IV     PRN Meds:.bisacodyl, calcium carbonate, methocarbamol **OR** methocarbamol (ROBAXIN) IV, morphine injection, ondansetron (ZOFRAN) IV, oxyCODONE  Current Outpatient Medications  Medication Instructions   ACCU-CHEK AVIVA PLUS test strip USE AS INSTRUCTED DX E11.9    Accu-Chek FastClix Lancets MISC To check blood sugars once a day. DX E11.9   albuterol (VENTOLIN HFA) 108 (90 Base) MCG/ACT inhaler INHALE 1-2 PUFFS INTO THE LUNGS EVERY 4 HOURS AS NEEDED FOR WHEEZING OR SHORTNESS OF BREATH.   aspirin EC 81 mg, Oral, Daily   Blood Glucose Monitoring Suppl (ACCU-CHEK AVIVA PLUS) w/Device KIT 1 each, Does not apply, Daily, Dx E11.9   ciprofloxacin (CIPRO) 500 mg, Oral, Daily-1800   cyanocobalamin 500 mcg, Oral, Daily   ferrous sulfate 325 mg, Oral, Daily with breakfast, TAKE 1 TABLET BY MOUTH DAILY.   furosemide (LASIX) 20 MG tablet TAKE 1 TABLET BY MOUTH EVERY DAY   metoprolol tartrate (LOPRESSOR) 25 MG tablet TAKE 1/2 TABLET BY MOUTH EVERY DAY   SIMBRINZA 1-0.2 % SUSP 1 drop, Both Eyes, 2 times daily   timolol (TIMOPTIC) 0.5 % ophthalmic solution 1 drop, Both Eyes, 2 times daily    Diet Orders (From admission, onward)     Start     Ordered   05/27/22 0539  Diet Carb Modified Fluid consistency: Thin; Room service appropriate? Yes  Diet effective now       Question Answer Comment  Diet-HS Snack? Nothing   Calorie Level Medium 1600-2000   Fluid consistency: Thin   Room service appropriate? Yes      05/27/22 0539            DVT prophylaxis: SCDs Start: 05/26/22 1324   Lab Results  Component Value Date   PLT 44 (L) 05/31/2022      Code Status: Full Code  Family Communication: Granddaughter at bedside  Status is: Inpatient  Remains inpatient appropriate because: Awaiting CIR   Level of care: Med-Surg  Consultants:  ID Orthopedic surgery  Objective: Vitals:   05/30/22 1400 05/30/22 1920 05/31/22 0608 05/31/22 0805  BP: 125/61 (!) 147/59 (!) 142/81 (!) 141/51  Pulse: 69 76 73 75  Resp: _0 Temp: 97.6 F (36.4 C) 98.4 F (36.9 C) 98.2 F (36.8 C) 97.6 F (36.4 C)  TempSrc:    Oral  SpO2: 100% 100% 95%   Weight:      Height:        Intake/Output Summary (Last 24 hours) at 05/31/2022 1011 Last data filed at  05/31/2022 0900 Gross per 24 hour  Intake 120 ml  Output 350 ml  Net -230 ml    Wt Readings from Last 3 Encounters:  05/26/22 56 kg  05/11/22 56.7 kg  04/17/22 54.9 kg    Examination:  Constitutional: NAD Eyes: lids and conjunctivae normal, no scleral icterus ENMT: mmm Neck: normal, supple Respiratory: clear to auscultation bilaterally, no wheezing, no crackles. Normal respiratory effort.  Cardiovascular: Regular rate and rhythm, no murmurs / rubs / gallops. No LE edema. Abdomen: soft, no distention, no tenderness. Bowel sounds positive.  Skin: no rashes Neurologic: no focal deficits, equal strength  Data Reviewed: I have independently reviewed following labs and imaging studies   CBC Recent Labs  Lab 05/24/22 1123 05/24/22 1959 05/26/22 0354 05/27/22 6568 05/28/22 0245 05/29/22 1275 05/30/22 0250  05/31/22 0255  WBC 4.6  --  19.1* 16.6* 21.2* 15.9* 12.9* 9.6  HGB 7.3*   < > 10.3* 9.9* 8.5* 7.5* 8.9* 10.8*  HCT 23.4*   < > 31.0* 29.1* 25.3* 22.9* 26.5* 33.1*  PLT 57*  --  69* 50* 43* 42* 47* 44*  MCV 81.3  --  82.2 82.0 81.9 83.6 81.3 83.8  MCH 25.3*  --  27.3 27.9 27.5 27.4 27.3 27.3  MCHC 31.2  --  33.2 34.0 33.6 32.8 33.6 32.6  RDW 18.7*  --  18.0* 18.3* 18.5* 18.8* 18.6* 19.1*  LYMPHSABS 1.6  --  1.6  --   --   --   --   --   MONOABS 1.0  --  9.1*  --   --   --   --   --   EOSABS 0.0  --  0.0  --   --   --   --   --   BASOSABS 0.0  --  0.1  --   --   --   --   --    < > = values in this interval not displayed.     Recent Labs  Lab 05/24/22 1123 05/26/22 0648 05/27/22 0521 05/28/22 0245 05/29/22 0512 05/30/22 0250 05/31/22 0255  NA 138 138 136 138 133* 136 136  K 3.0* 3.1* 3.3* 4.2 4.4 4.6 5.8*  CL 107 108 104 108 105 107 109  CO2 20* 20* 20* 20* 17* 19* 20*  GLUCOSE 222* 243* 196* 112* 199* 161* 130*  BUN 50* 42* 41* 50* 63* 68* 64*  CREATININE 1.76* 1.54* 1.59* 2.08* 2.14* 1.82* 1.46*  CALCIUM 7.7* 7.8* 8.0* 8.1* 8.0* 8.0* 8.5*  AST 19 24  --   23 37 36  --   ALT 9 7  --  _0 --   ALKPHOS 91 77  --  83 106 126  --   BILITOT 0.7 1.5*  --  1.5* 1.4* 1.4*  --   ALBUMIN 3.4* 3.0*  --  2.2* 2.1* 2.0*  --   MG  --   --   --  1.7 1.8 1.9  --      ------------------------------------------------------------------------------------------------------------------ No results for input(s): "CHOL", "HDL", "LDLCALC", "TRIG", "CHOLHDL", "LDLDIRECT" in the last 72 hours.  Lab Results  Component Value Date   HGBA1C 6.2 (H) 12/27/2021   ------------------------------------------------------------------------------------------------------------------ No results for input(s): "TSH", "T4TOTAL", "T3FREE", "THYROIDAB" in the last 72 hours.  Invalid input(s): "FREET3"  Cardiac Enzymes No results for input(s): "CKMB", "TROPONINI", "MYOGLOBIN" in the last 168 hours.  Invalid input(s): "CK" ------------------------------------------------------------------------------------------------------------------ No results found for: "BNP"  CBG: Recent Labs  Lab 05/30/22 0735 05/30/22 1114 05/30/22 1607 05/30/22 1921 05/31/22 0802  GLUCAP 139* 201* 210* 171* 100*     Recent Results (from the past 240 hour(s))  Surgical PCR screen     Status: None   Collection Time: 05/26/22 11:14 AM   Specimen: Nasal Mucosa; Nasal Swab  Result Value Ref Range Status   MRSA, PCR NEGATIVE NEGATIVE Final   Staphylococcus aureus NEGATIVE NEGATIVE Final    Comment: (NOTE) The Xpert SA Assay (FDA approved for NASAL specimens in patients 32 years of age and older), is one component of a comprehensive surveillance program. It is not intended to diagnose infection nor to guide or monitor treatment. Performed at [facility], [place] [address] [place], [place] [address]      Radiology Studies: No results found.  Marzetta Board, MD, PhD [facility]  Between 7 am - 7 pm I am available, please contact me via Amion (for emergencies) or  Securechat (non urgent messages)  Between 7 pm - 7 am I am not available, please contact night coverage MD/APP via Amion

## 2022-05-31 NOTE — Progress Notes (Signed)
Occupational Therapy Treatment Patient Details Name: Gabriela [name] MRN: [id number] DOB: [birth year] Today's Date: 05/31/2022   History of present illness 88 y/o female presented to ED on 05/24/22 following fall. Sustained R distal femur fx and L distal femur fx. S/p ORIF of R femur fx and ORIF of L femur fx on 10/12. PMH: diabetes, CKD stage 4, HTN, glaucoma   OT comments  Pt in recliner upon therapy arrival and agreeable to participate in OT treatment session. Pt completed BUE strengthening exercises using yellow tband to increase BUE strength and endurance needed to increased functional performance during sit<> stands and transfers. Set-up required for band with VC and visual demonstration provided for form and technique. Pt able to return demonstration. Pt demonstrated muscle fatigue in BUE as exercises progressed with rest breaks provided as needed. Continued to benefit from skilled OT services to focus on decreased strength and endurance in order to increase functional performance during BADL tasks. OT will continue to follow patient acutely.    Recommendations for follow up therapy are one component of a multi-disciplinary discharge planning process, led by the attending physician.  Recommendations may be updated based on patient status, additional functional criteria and insurance authorization.    Follow Up Recommendations  Acute inpatient rehab (3hours/day)    Assistance Recommended at Discharge Frequent or constant Supervision/Assistance  Patient can return home with the following  Two people to help with bathing/dressing/bathroom;Two people to help with walking and/or transfers;Help with stairs or ramp for entrance;Assist for transportation;Direct supervision/assist for financial management;Direct supervision/assist for medications management;Assistance with cooking/housework   Equipment Recommendations  Other (comment) (defer to next venue)       Precautions / Restrictions  Precautions Precautions: Fall Precaution Comments: HOH, glaucoma Restrictions Weight Bearing Restrictions: Yes RLE Weight Bearing: Weight bearing as tolerated LLE Weight Bearing: Weight bearing as tolerated Other Position/Activity Restrictions: Transfers only       Mobility   Transfers Overall transfer level:  (Pt in recliner upon therapy arrival. See PT note for transfer and bed mobility performance)             ADL either performed or assessed with clinical judgement    Extremity/Trunk Assessment Upper Extremity Assessment Upper Extremity Assessment: Generalized weakness   Lower Extremity Assessment Lower Extremity Assessment: Defer to PT evaluation        Vision Baseline Vision/History: 1 Wears glasses Ability to See in Adequate Light: 0 Adequate Patient Visual Report: No change from baseline Vision Assessment?: No apparent visual deficits          Cognition Arousal/Alertness: Awake/alert Behavior During Therapy: WFL for tasks assessed/performed Overall Cognitive Status: Within Functional Limits for tasks assessed            Exercises General Exercises - Upper Extremity Shoulder Horizontal ABduction: Strengthening, Both, 10 reps, Seated, Theraband Theraband Level (Shoulder Horizontal Abduction): Level 1 (Yellow) Shoulder Horizontal ADduction: Strengthening, Both, 10 reps, Seated, Theraband Theraband Level (Shoulder Horizontal Adduction): Level 1 (Yellow) Shoulder Exercises Shoulder Flexion: Strengthening, Theraband, Seated, Both, 10 reps Theraband Level (Shoulder Flexion): Level 1 (Yellow) Shoulder External Rotation: Strengthening, Both, 10 reps, Seated, Theraband Theraband Level (Shoulder External Rotation): Level 1 (Yellow)            Pertinent Vitals/ Pain       Pain Assessment Pain Assessment: 0-10 Pain Score: 4  Pain Location: Left leg. reports left leg is bothering her more than right Pain Descriptors / Indicators: Aching, Sore Pain  Intervention(s): Monitored during session  Frequency  Min 2X/week        Progress Toward Goals  OT Goals(current goals can now be found in the care plan section)  Progress towards OT goals: Progressing toward goals     Plan Discharge plan remains appropriate;Frequency remains appropriate       AM-PAC OT "6 Clicks" Daily Activity     Outcome Measure   Help from another person eating meals?: A Little Help from another person taking care of personal grooming?: A Little Help from another person toileting, which includes using toliet, bedpan, or urinal?: Total Help from another person bathing (including washing, rinsing, drying)?: A Lot Help from another person to put on and taking off regular upper body clothing?: A Little Help from another person to put on and taking off regular lower body clothing?: Total 6 Click Score: 13    End of Session    OT Visit Diagnosis: Unsteadiness on feet (R26.81);Muscle weakness (generalized) (M62.81);Pain;History of falling (Z91.81) Pain - Right/Left: Left Pain - part of body: Leg   Activity Tolerance Patient limited by pain   Patient Left in chair;with call bell/phone within reach;with chair alarm set;Other (comment) (Daughter in and out of room on phone)   Nurse Communication          Time: [phone] OT Time Calculation (min): 11 min  Charges: OT General Charges $OT Visit: 1 Visit OT Treatments $Therapeutic Exercise: 8-22 mins  Ailene Ravel, OTR/L,CBIS  Supplemental OT - [facility] and [facility]   Ricki Clack, Clarene Duke 05/31/2022, 11:54 AM

## 2022-05-31 NOTE — Care Management Important Message (Signed)
Important Message  Patient Details  Name: Gabriela [name] MRN: [id number] Date of Birth: 12/28/1925   Medicare Important Message Given:  Yes     Hannah Beat 05/31/2022, 12:11 PM

## 2022-05-31 NOTE — Progress Notes (Signed)
Physical Therapy Treatment Patient Details Name: Gabriela [name] MRN: [id number] DOB: Sep 05, 1925 Today's Date: 05/31/2022   History of Present Illness 88 y/o female presented to ED on 05/24/22 following fall. Sustained R distal femur fx and L distal femur fx. S/p ORIF of R femur fx and ORIF of L femur fx on 10/12. PMH: diabetes, CKD stage 4, HTN, glaucoma    PT Comments    Continuing work on functional mobility and activity tolerance;  Session focused on functional transfers, and therapeutic exercise aimed at muscle activation about knees; Performed lateral scoot transfer with sliding board with 2 person assist; Max/Total assist for sliding board placement, Mod assist to initiate scooting; as lateral scoot transfer progressed; pt became more understanding of weight shifting needed, and hand placement, and progressed to needing min assist to scoot and monitor for pad placement; Once in chair, performed bil knee flexion and extension exercises, including muscle holding/knee extension against gravity, and flexion with manual tracking resistance; Excellent participation and progress; Continue to recommend acute inpatient rehab (AIR) for post-acute therapy needs.   Recommendations for follow up therapy are one component of a multi-disciplinary discharge planning process, led by the attending physician.  Recommendations may be updated based on patient status, additional functional criteria and insurance authorization.  Follow Up Recommendations  Acute inpatient rehab (3hours/day)     Assistance Recommended at Discharge Frequent or constant Supervision/Assistance  Patient can return home with the following A lot of help with walking and/or transfers;A lot of help with bathing/dressing/bathroom;Assistance with cooking/housework;Direct supervision/assist for medications management;Direct supervision/assist for financial management;Assist for transportation;Help with stairs or ramp for entrance    Equipment Recommendations  Wheelchair (measurements PT);Wheelchair cushion (measurements PT);Other (comment) (slideboard; drop-arm BSC)    Recommendations for Other Services Rehab consult     Precautions / Restrictions Precautions Precautions: Fall Precaution Comments: HOH, glaucoma Restrictions Weight Bearing Restrictions: Yes RLE Weight Bearing: Weight bearing as tolerated LLE Weight Bearing: Weight bearing as tolerated Other Position/Activity Restrictions: Transfers only     Mobility  Bed Mobility Overal bed mobility: Needs Assistance Bed Mobility: Supine to Sit     Supine to sit: Max assist     General bed mobility comments: Mod assist to help LEs towards EOB, then pt able to scoot hips/pelvis, and use bedrails to turn upper body; Good reach for rail to pull to sit; Max assist to get fully to upright sitting; on air mattress, which complicates bed mobiltiy    Transfers Overall transfer level: Needs assistance Equipment used: Sliding board Transfers: Bed to chair/wheelchair/BSC            Lateral/Scoot Transfers: Max assist, Min assist, +2 physical assistance, +2 safety/equipment, With slide board General transfer comment: Max/Total assist for sliding board placement and management; Educated in technqiue and safe hand placement; Initial scoot with mod assist; after that, pt able to make small scoots across board to recliner (to her Rside); multiple scoots to complete transfer, inefficient and energetically taxing    Ambulation/Gait                   Stairs             Wheelchair Mobility    Modified Rankin (Stroke Patients Only)       Balance     Sitting balance-Leahy Scale: Fair  Cognition Arousal/Alertness: Awake/alert Behavior During Therapy: WFL for tasks assessed/performed Overall Cognitive Status: Within Functional Limits for tasks assessed                                           Exercises Other Exercises Other Exercises: Bilateral LAQ, alternating R and L, AAROM x10 reps Other Exercises: bil knee flexion, alternating, with manual tracking resistence x10 Other Exercises: Positioned in knee extension stretch, with ankles propped and long blanket roll placed between knees and lower legs    General Comments General comments (skin integrity, edema, etc.): Gabriela [name], granddaughter, present and helpful      Pertinent Vitals/Pain Pain Assessment Pain Assessment: 0-10 Pain Score: 5  Pain Location: Left leg. reports left leg is bothering her more than right Pain Descriptors / Indicators: Aching, Sore Pain Intervention(s): Monitored during session, Premedicated before session    Home Living                          Prior Function            PT Goals (current goals can now be found in the care plan section) Acute Rehab PT Goals Patient Stated Goal: back to indepedence PT Goal Formulation: With patient/family Time For Goal Achievement: 06/10/22 Potential to Achieve Goals: Good Progress towards PT goals: Progressing toward goals    Frequency    Min 4X/week      PT Plan Current plan remains appropriate    Co-evaluation              AM-PAC PT "6 Clicks" Mobility   Outcome Measure  Help needed turning from your back to your side while in a flat bed without using bedrails?: A Lot Help needed moving from lying on your back to sitting on the side of a flat bed without using bedrails?: A Lot Help needed moving to and from a bed to a chair (including a wheelchair)?: A Lot Help needed standing up from a chair using your arms (e.g., wheelchair or bedside chair)?: Total Help needed to walk in hospital room?: Total Help needed climbing 3-5 steps with a railing? : Total 6 Click Score: 9    End of Session Equipment Utilized During Treatment: Gait belt Activity Tolerance: Patient tolerated treatment well Patient left: in  chair;with call bell/phone within reach;with family/visitor present Nurse Communication: Mobility status PT Visit Diagnosis: Unsteadiness on feet (R26.81);Muscle weakness (generalized) (M62.81);History of falling (Z91.81);Other abnormalities of gait and mobility (R26.89)     Time: 6546-5035 PT Time Calculation (min) (ACUTE ONLY): 44 min  Charges:  $Therapeutic Exercise: 8-22 mins $Therapeutic Activity: 23-37 mins                     Roney Marion, PT  Acute Rehabilitation Services Office [phone]    Colletta Maryland 05/31/2022, 12:18 PM

## 2022-05-31 NOTE — Progress Notes (Signed)
Mobility Specialist Progress Note   05/31/22 1400  Mobility  Activity Transferred from chair to bed  Level of Assistance +2 (takes two people)  Assistive Device MaxiMove  RLE Weight Bearing WBAT  LLE Weight Bearing WBAT  Activity Response Tolerated well  $Mobility charge 1 Mobility   NT requesting assistance to get pt from bed to chair. Required +2A for safety, no faults during transfer. Pt left in chair w/ chair alarm on and all needs met.  Holland Falling Mobility Specialist MS [facility] #:  [phone] Acute Rehab Office:  [phone]

## 2022-05-31 NOTE — Consult Note (Signed)
   [facility] Inpatient Consult   05/31/2022  [place] January 22, 1926 [id number]  [place] [facility] [ACO] [name]  Primary Care Provider:  [name], MD with [facility]  Patient screened for hospitalization on unit rounds as in network with noted for potential inpatient rehab for bilateral femur fractures. PT/OT recommending an inpatient rehabilitation stay for post hospital transition.    Plan:  Continue to follow progress and disposition to assess for post hospital care management needs.    For questions contact:   Natividad Brood, RN BSN [facility] Liaison  [phone] business mobile phone Toll free office [phone]  Fax number: [phone] [email or web address]

## 2022-06-01 LAB — BASIC METABOLIC PANEL
Anion gap: 12 (ref 5–15)
Anion gap: 10 (ref 5–15)
BUN: 62 mg/dL — ABNORMAL HIGH (ref 8–23)
BUN: 57 mg/dL — ABNORMAL HIGH (ref 8–23)
CO2: 21 mmol/L — ABNORMAL LOW (ref 22–32)
CO2: 22 mmol/L (ref 22–32)
Calcium: 9 mg/dL (ref 8.9–10.3)
Calcium: 8.5 mg/dL — ABNORMAL LOW (ref 8.9–10.3)
Chloride: 107 mmol/L (ref 98–111)
Chloride: 107 mmol/L (ref 98–111)
Creatinine, Ser: 1.58 mg/dL — ABNORMAL HIGH (ref 0.44–1.00)
Creatinine, Ser: 1.49 mg/dL — ABNORMAL HIGH (ref 0.44–1.00)
GFR, Estimated: 30 mL/min — ABNORMAL LOW (ref >60)
GFR, Estimated: 32 mL/min — ABNORMAL LOW (ref >60)
Glucose, Bld: 133 mg/dL — ABNORMAL HIGH (ref 70–99)
Glucose, Bld: 194 mg/dL — ABNORMAL HIGH (ref 70–99)
Potassium: 6.3 mmol/L (ref 3.5–5.1)
Potassium: 5.3 mmol/L — ABNORMAL HIGH (ref 3.5–5.1)
Sodium: 140 mmol/L (ref 135–145)
Sodium: 139 mmol/L (ref 135–145)

## 2022-06-01 LAB — GLUCOSE, CAPILLARY
Glucose-Capillary: 113 mg/dL — ABNORMAL HIGH (ref 70–99)
Glucose-Capillary: 159 mg/dL — ABNORMAL HIGH (ref 70–99)
Glucose-Capillary: 185 mg/dL — ABNORMAL HIGH (ref 70–99)
Glucose-Capillary: 119 mg/dL — ABNORMAL HIGH (ref 70–99)

## 2022-06-01 LAB — CBC
HCT: 28.3 % — ABNORMAL LOW (ref 36.0–46.0)
Hemoglobin: 9.2 g/dL — ABNORMAL LOW (ref 12.0–15.0)
MCH: 27.2 pg (ref 26.0–34.0)
MCHC: 32.5 g/dL (ref 30.0–36.0)
MCV: 83.7 fL (ref 80.0–100.0)
Platelets: 48 10*3/uL — ABNORMAL LOW (ref 150–400)
RBC: 3.38 MIL/uL — ABNORMAL LOW (ref 3.87–5.11)
RDW: 19.5 % — ABNORMAL HIGH (ref 11.5–15.5)
WBC: 8.6 10*3/uL (ref 4.0–10.5)
nRBC: 0 % (ref 0.0–0.2)

## 2022-06-01 MED ORDER — SODIUM ZIRCONIUM CYCLOSILICATE 5 G PO PACK
5.0000 g | PACK | Freq: Once | ORAL | Status: AC
  Administered 2022-06-01: 5 g via ORAL
  Filled 2022-06-01: qty 1

## 2022-06-01 MED ORDER — SODIUM POLYSTYRENE SULFONATE 15 GM/60ML PO SUSP
15.0000 g | Freq: Once | ORAL | Status: AC
  Administered 2022-06-01: 15 g via ORAL
  Filled 2022-06-01: qty 60

## 2022-06-01 NOTE — Progress Notes (Signed)
[facility] PROGRESS NOTE    Progress Note  Gabriela [name]  HQP:[id number] DOB: [birth year] DOA: 05/26/2022 PCP: [name], MD     Brief Narrative:   Gabriela [name] is an 88 y.o. female past medical history of diabetes mellitus type 2, chronic kidney disease stage IV, chronic pancytopenia comes into the hospital after a fall and was with bilateral femur fractures transferred to [facility] status post surgical intervention on 05/26/2022   Assessment/Plan:   Right supracondylar femur fracture below TH 80/left supracondylar femur fracture with intra-articular extension: Orthopedic surgery was consulted status post oh ORIF on the right and Biomet NCB plate on the left. Orthopedic surgery recommended weightbearing as tolerated. Lovenox for DVT prophylaxis. Physical therapy evaluated patient recommended inpatient rehab.  Patient is in agreement. Insurance denied inpatient rehab family and patient appealing.  Right thigh wound with Pseudomonas infection, chronic osteomyelitis surrounding acetabular cup: She was admitted for this in September, ID was consulted recommended Cipro 500 mg for 6 weeks after 05/17/2022. ED was reconsulted, CT scan of the hip done ID recommended no current antibiotics.  Chronic kidney disease stage IV: With a baseline creatinine of 1.3-1.7 up to 2.1 after fluid resuscitation returned to baseline.  Hypokalemia: Replete orally, now elevated, she was given Kayexalate and persistently elevated, gave oral Kayexalate basic metabolic panels pending this afternoon.  History of CABG: Stable.  Glaucoma: Continue eyedrops.  Anemia chronic disease: This post 1 unit of packed red blood cell on 02/16/2022 hemoglobin this morning is stable, is 9.2.  Diabetes mellitus type 2: Continue sliding scale insulin, blood glucose fairly controlled.    DVT prophylaxis: lovenox Family Communication:daughter Status is: Inpatient Remains inpatient  appropriate because: Bilateral hip fractures    Code Status:     Code Status Orders  (From admission, onward)           Start     Ordered   05/26/22 1324  Full code  Continuous        05/26/22 1325           Code Status History     Date Active Date Inactive Code Status Order ID Comments User Context   04/15/2022 1352 04/21/2022 1733 DNR [id number]  Collier Bullock, MD ED   10/18/2020 2337 10/22/2020 2205 Full Code [id number]  Clance Boll, MD ED   06/05/2013 1415 06/08/2013 1547 Full Code [id number]  Ivin Poot, MD Inpatient   05/31/2013 1836 06/05/2013 1415 Full Code [id number]  Nani Skillern, PA-C Inpatient         IV Access:   Peripheral IV   Procedures and diagnostic studies:   No results found.   Medical Consultants:   None.   Subjective:    Gabriela [name] relates her pain is controlled.  Objective:    Vitals:   05/31/22 1527 05/31/22 2208 06/01/22 0408 06/01/22 0735  BP: (!) 124/59 (!) 119/52 (!) 140/70 (!) 142/62  Pulse: 75  70 70  Resp: '18 16 16 17  '$ Temp: 98.1 F (36.7 C) (!) 97.3 F (36.3 C) 97.8 F (36.6 C) 97.6 F (36.4 C)  TempSrc:  Oral Oral Oral  SpO2: 100% 100% 100% 100%  Weight:      Height:       SpO2: 100 %   Intake/Output Summary (Last 24 hours) at 06/01/2022 1244 Last data filed at 06/01/2022 0030 Gross per 24 hour  Intake 120 ml  Output 700 ml  Net -580 ml   [employer]  05/26/22 1134  Weight: 56 kg    Exam: General exam: In no acute distress. Respiratory system: Good air movement and clear to auscultation. Cardiovascular system: S1 & S2 heard, RRR. No JVD. Gastrointestinal system: Abdomen is nondistended, soft and nontender.  Extremities: No pedal edema. Skin: No rashes, lesions or ulcers Psychiatry: Judgement and insight appear normal. Mood & affect appropriate.    Data Reviewed:    Labs: Basic Metabolic Panel: Recent Labs  Lab 05/28/22 0245 05/29/22 0512 05/30/22 0250  05/31/22 0255 06/01/22 0325  NA 138 133* 136 136 140  K 4.2 4.4 4.6 5.8* 6.3*  CL 108 105 107 109 107  CO2 20* 17* 19* 20* 21*  GLUCOSE 112* 199* 161* 130* 133*  BUN 50* 63* 68* 64* 62*  CREATININE 2.08* 2.14* 1.82* 1.46* 1.58*  CALCIUM 8.1* 8.0* 8.0* 8.5* 9.0  MG 1.7 1.8 1.9  --   --   PHOS 3.1 3.3  --   --   --    GFR Estimated Creatinine Clearance: 18.8 mL/min (A) (by C-G formula based on SCr of 1.58 mg/dL (H)). Liver Function Tests: Recent Labs  Lab 05/26/22 0648 05/28/22 0245 05/29/22 0512 05/30/22 0250  AST 24 23 37 36  ALT '7 6 7 6  '$ ALKPHOS 77 83 106 126  BILITOT 1.5* 1.5* 1.4* 1.4*  PROT 7.6 6.7 6.6 6.6  ALBUMIN 3.0* 2.2* 2.1* 2.0*   No results for input(s): "LIPASE", "AMYLASE" in the last 168 hours. No results for input(s): "AMMONIA" in the last 168 hours. Coagulation profile No results for input(s): "INR", "PROTIME" in the last 168 hours. COVID-19 Labs  No results for input(s): "DDIMER", "FERRITIN", "LDH", "CRP" in the last 72 hours.  Lab Results  Component Value Date   [place] Not Detected 06/28/2021   [place] NEGATIVE 02/10/2021   [place] NEGATIVE 10/18/2020   [place] Not Detected 09/11/2020    CBC: Recent Labs  Lab 05/26/22 0648 05/27/22 0521 05/28/22 0245 05/29/22 0512 05/30/22 0250 05/31/22 0255 06/01/22 0325  WBC 19.1*   < > 21.2* 15.9* 12.9* 9.6 8.6  NEUTROABS 7.8*  --   --   --   --   --   --   HGB 10.3*   < > 8.5* 7.5* 8.9* 10.8* 9.2*  HCT 31.0*   < > 25.3* 22.9* 26.5* 33.1* 28.3*  MCV 82.2   < > 81.9 83.6 81.3 83.8 83.7  PLT 69*   < > 43* 42* 47* 44* 48*   < > = values in this interval not displayed.   Cardiac Enzymes: No results for input(s): "CKTOTAL", "CKMB", "CKMBINDEX", "TROPONINI" in the last 168 hours. BNP (last 3 results) No results for input(s): "PROBNP" in the last 8760 hours. CBG: Recent Labs  Lab 05/31/22 1148 05/31/22 1616 05/31/22 2050 06/01/22 0732 06/01/22 1122  GLUCAP 155* 173* 122* 113*  159*   D-Dimer: No results for input(s): "DDIMER" in the last 72 hours. Hgb A1c: No results for input(s): "HGBA1C" in the last 72 hours. Lipid Profile: No results for input(s): "CHOL", "HDL", "LDLCALC", "TRIG", "CHOLHDL", "LDLDIRECT" in the last 72 hours. Thyroid function studies: No results for input(s): "TSH", "T4TOTAL", "T3FREE", "THYROIDAB" in the last 72 hours.  Invalid input(s): "FREET3" Anemia work up: No results for input(s): "VITAMINB12", "FOLATE", "FERRITIN", "TIBC", "IRON", "RETICCTPCT" in the last 72 hours. Sepsis Labs: Recent Labs  Lab 05/29/22 0512 05/30/22 0250 05/31/22 0255 06/01/22 0325  WBC 15.9* 12.9* 9.6 8.6   Microbiology Recent Results (from the past 240 hour(s))  Surgical PCR screen     Status: None   Collection Time: 05/26/22 11:14 AM   Specimen: Nasal Mucosa; Nasal Swab  Result Value Ref Range Status   MRSA, PCR NEGATIVE NEGATIVE Final   Staphylococcus aureus NEGATIVE NEGATIVE Final    Comment: (NOTE) The Xpert SA Assay (FDA approved for NASAL specimens in patients 29 years of age and older), is one component of a comprehensive surveillance program. It is not intended to diagnose infection nor to guide or monitor treatment. Performed at [facility], [place] [address] [place], [place] [address]      Medications:    acetaminophen  650 mg Oral Q8H   alum & mag hydroxide-simeth  15 mL Oral Once   aspirin EC  81 mg Oral Daily   brinzolamide  1 drop Both Eyes BID   And   brimonidine  1 drop Both Eyes BID   docusate sodium  100 mg Oral BID   feeding supplement (GLUCERNA SHAKE)  237 mL Oral TID BM   insulin aspart  0-5 Units Subcutaneous QHS   insulin aspart  0-9 Units Subcutaneous TID WC   metoprolol tartrate  12.5 mg Oral Daily   multivitamin with minerals  1 tablet Oral Daily   polyethylene glycol  17 g Oral BID   timolol  1 drop Both Eyes BID   Vitamin D (Ergocalciferol)  50,000 Units Oral Q7 days   Continuous Infusions:   methocarbamol (ROBAXIN) IV        LOS: 6 days   Gabriela [name]  [facility]  06/01/2022, 12:44 PM

## 2022-06-01 NOTE — Progress Notes (Signed)
Inpatient Rehab Admissions Coordinator:    I continue to await insurance auth for CIR. Expedited appeal filed yesterday. I will continue to follow for potential admit pending insurance auth.   Clemens Catholic, [place], [place] Admissions Coordinator  [phone] ([place]) [phone] (office)

## 2022-06-01 NOTE — Progress Notes (Signed)
Critical lab value:  Potassium 6.3  Contacted on call MD.  New order for Lokelma 5 grams.

## 2022-06-01 NOTE — Progress Notes (Signed)
Physical Therapy Treatment Patient Details Name: Gabriela [name] MRN: [id number] DOB: 10/26/1925 Today's Date: 06/01/2022   History of Present Illness 88 y/o female presented to ED on 05/24/22 following fall. Sustained R distal femur fx and L distal femur fx. S/p ORIF of R femur fx and ORIF of L femur fx on 10/12. PMH: diabetes, CKD stage 4, HTN, glaucoma    PT Comments    Continuing work on functional mobility and activity tolerance;  Session focused on bed mobility and functional transfers, with some good progress today; Needing less assist to pull up to EOB on her R; Using more anterior weight shifting onto bil LEs to unweigh hips for lateral scooting bed to recliner; Overall progressing well; Anticipate continuing good progress at post-acute rehabilitation.   Recommendations for follow up therapy are one component of a multi-disciplinary discharge planning process, led by the attending physician.  Recommendations may be updated based on patient status, additional functional criteria and insurance authorization.  Follow Up Recommendations  Acute inpatient rehab (3hours/day)     Assistance Recommended at Discharge Frequent or constant Supervision/Assistance  Patient can return home with the following A lot of help with walking and/or transfers;A lot of help with bathing/dressing/bathroom;Assistance with cooking/housework;Direct supervision/assist for medications management;Direct supervision/assist for financial management;Assist for transportation;Help with stairs or ramp for entrance   Equipment Recommendations  Wheelchair (measurements PT);Wheelchair cushion (measurements PT);Other (comment) (slideboard; drop-arm BSC)    Recommendations for Other Services Rehab consult     Precautions / Restrictions Precautions Precautions: Fall Precaution Comments: HOH, glaucoma Restrictions RLE Weight Bearing: Weight bearing as tolerated LLE Weight Bearing: Weight bearing as tolerated Other  Position/Activity Restrictions: Transfers only     Mobility  Bed Mobility Overal bed mobility: Needs Assistance Bed Mobility: Supine to Sit     Supine to sit: Min assist, Mod assist     General bed mobility comments: Got up to R side of bed today, which pt is more familiar with; very nice initiation, moved bil les to EOB without assist and used top bedrail on her R with 2 hands to turn torso and elevate trunk to sit; Mod assist and use of bed pad to square off hips at EOB    Transfers Overall transfer level: Needs assistance Equipment used:  (bed pad) Transfers: Bed to chair/wheelchair/BSC            Lateral/Scoot Transfers: Mod assist, +2 safety/equipment, Max assist General transfer comment: heavy mod assist and bed pad to cradle hips during scoot transfer towards her L; Demonstrating good weight shift anteriorly, and incr weight acceptance of bil feet on floor; Max assist to scoot hips completely to center of recliner, and back of the seat for optimal positioning    Ambulation/Gait                   Stairs             Wheelchair Mobility    Modified Rankin (Stroke Patients Only)       Balance     Sitting balance-Leahy Scale: Fair       Standing balance-Leahy Scale: Zero                              Cognition Arousal/Alertness: Awake/alert Behavior During Therapy: WFL for tasks assessed/performed Overall Cognitive Status: Within Functional Limits for tasks assessed  Exercises      General Comments General comments (skin integrity, edema, etc.): [name], granddaughter, present and helpful      Pertinent Vitals/Pain Pain Assessment Pain Assessment: Faces Faces Pain Scale: Hurts little more Pain Location: Bil LEs Pain Descriptors / Indicators: Aching, Sore Pain Intervention(s): Premedicated before session    Home Living                          Prior  Function            PT Goals (current goals can now be found in the care plan section) Acute Rehab PT Goals Patient Stated Goal: back to indepedence PT Goal Formulation: With patient/family Time For Goal Achievement: 06/10/22 Potential to Achieve Goals: Good Progress towards PT goals: Progressing toward goals    Frequency    Min 4X/week      PT Plan Current plan remains appropriate    Co-evaluation              AM-PAC PT "6 Clicks" Mobility   Outcome Measure  Help needed turning from your back to your side while in a flat bed without using bedrails?: A Lot Help needed moving from lying on your back to sitting on the side of a flat bed without using bedrails?: A Lot Help needed moving to and from a bed to a chair (including a wheelchair)?: A Lot Help needed standing up from a chair using your arms (e.g., wheelchair or bedside chair)?: Total Help needed to walk in hospital room?: Total Help needed climbing 3-5 steps with a railing? : Total 6 Click Score: 9    End of Session Equipment Utilized During Treatment: Gait belt Activity Tolerance: Patient tolerated treatment well Patient left: in chair;with call bell/phone within reach;with family/visitor present Nurse Communication: Mobility status PT Visit Diagnosis: Unsteadiness on feet (R26.81);Muscle weakness (generalized) (M62.81);History of falling (Z91.81);Other abnormalities of gait and mobility (R26.89)     Time: 6440-3474 PT Time Calculation (min) (ACUTE ONLY): 36 min  Charges:  $Therapeutic Activity: 23-37 mins                     Roney Marion, PT  Acute Rehabilitation Services Office [phone]    Colletta Maryland 06/01/2022, 11:38 AM

## 2022-06-02 ENCOUNTER — Other Ambulatory Visit: Payer: Self-pay

## 2022-06-02 ENCOUNTER — Encounter (HOSPITAL_COMMUNITY): Payer: Self-pay | Admitting: Physical Medicine and Rehabilitation

## 2022-06-02 ENCOUNTER — Inpatient Hospital Stay (HOSPITAL_COMMUNITY)
Admission: RE | Admit: 2022-06-02 | Discharge: 2022-06-22 | DRG: 560 | Disposition: A | Discharge status: Home Health | Payer: Medicare Other | Source: Intra-hospital | Attending: Physical Medicine and Rehabilitation | Admitting: Physical Medicine and Rehabilitation

## 2022-06-02 DIAGNOSIS — S72401D Unspecified fracture of lower end of right femur, subsequent encounter for closed fracture with routine healing: Secondary | ICD-10-CM | POA: Diagnosis present

## 2022-06-02 DIAGNOSIS — M1712 Unilateral primary osteoarthritis, left knee: Secondary | ICD-10-CM | POA: Diagnosis present

## 2022-06-02 DIAGNOSIS — W1830XD Fall on same level, unspecified, subsequent encounter: Secondary | ICD-10-CM

## 2022-06-02 DIAGNOSIS — D72829 Elevated white blood cell count, unspecified: Secondary | ICD-10-CM | POA: Diagnosis present

## 2022-06-02 DIAGNOSIS — S728X9A Other fracture of unspecified femur, initial encounter for closed fracture: Secondary | ICD-10-CM | POA: Diagnosis not present

## 2022-06-02 DIAGNOSIS — E1159 Type 2 diabetes mellitus with other circulatory complications: Secondary | ICD-10-CM

## 2022-06-02 DIAGNOSIS — T148XXA Other injury of unspecified body region, initial encounter: Secondary | ICD-10-CM | POA: Diagnosis present

## 2022-06-02 DIAGNOSIS — M25551 Pain in right hip: Secondary | ICD-10-CM | POA: Diagnosis present

## 2022-06-02 DIAGNOSIS — E1122 Type 2 diabetes mellitus with diabetic chronic kidney disease: Secondary | ICD-10-CM | POA: Diagnosis present

## 2022-06-02 DIAGNOSIS — D649 Anemia, unspecified: Secondary | ICD-10-CM

## 2022-06-02 DIAGNOSIS — R001 Bradycardia, unspecified: Secondary | ICD-10-CM | POA: Diagnosis not present

## 2022-06-02 DIAGNOSIS — M25561 Pain in right knee: Secondary | ICD-10-CM | POA: Diagnosis present

## 2022-06-02 DIAGNOSIS — I129 Hypertensive chronic kidney disease with stage 1 through stage 4 chronic kidney disease, or unspecified chronic kidney disease: Secondary | ICD-10-CM | POA: Diagnosis present

## 2022-06-02 DIAGNOSIS — N189 Chronic kidney disease, unspecified: Secondary | ICD-10-CM | POA: Diagnosis not present

## 2022-06-02 DIAGNOSIS — L8995 Pressure ulcer of unspecified site, unstageable: Secondary | ICD-10-CM | POA: Insufficient documentation

## 2022-06-02 DIAGNOSIS — S72491D Other fracture of lower end of right femur, subsequent encounter for closed fracture with routine healing: Secondary | ICD-10-CM | POA: Diagnosis not present

## 2022-06-02 DIAGNOSIS — Z833 Family history of diabetes mellitus: Secondary | ICD-10-CM

## 2022-06-02 DIAGNOSIS — Z951 Presence of aortocoronary bypass graft: Secondary | ICD-10-CM | POA: Diagnosis not present

## 2022-06-02 DIAGNOSIS — D472 Monoclonal gammopathy: Secondary | ICD-10-CM | POA: Diagnosis present

## 2022-06-02 DIAGNOSIS — Z79899 Other long term (current) drug therapy: Secondary | ICD-10-CM

## 2022-06-02 DIAGNOSIS — E559 Vitamin D deficiency, unspecified: Secondary | ICD-10-CM | POA: Diagnosis present

## 2022-06-02 DIAGNOSIS — E875 Hyperkalemia: Secondary | ICD-10-CM | POA: Diagnosis present

## 2022-06-02 DIAGNOSIS — Z882 Allergy status to sulfonamides status: Secondary | ICD-10-CM

## 2022-06-02 DIAGNOSIS — Z993 Dependence on wheelchair: Secondary | ICD-10-CM

## 2022-06-02 DIAGNOSIS — D638 Anemia in other chronic diseases classified elsewhere: Secondary | ICD-10-CM | POA: Diagnosis present

## 2022-06-02 DIAGNOSIS — L89616 Pressure-induced deep tissue damage of right heel: Secondary | ICD-10-CM | POA: Diagnosis not present

## 2022-06-02 DIAGNOSIS — D696 Thrombocytopenia, unspecified: Secondary | ICD-10-CM | POA: Diagnosis present

## 2022-06-02 DIAGNOSIS — M21371 Foot drop, right foot: Secondary | ICD-10-CM | POA: Diagnosis present

## 2022-06-02 DIAGNOSIS — R32 Unspecified urinary incontinence: Secondary | ICD-10-CM | POA: Diagnosis present

## 2022-06-02 DIAGNOSIS — E785 Hyperlipidemia, unspecified: Secondary | ICD-10-CM | POA: Diagnosis present

## 2022-06-02 DIAGNOSIS — I10 Essential (primary) hypertension: Secondary | ICD-10-CM | POA: Diagnosis present

## 2022-06-02 DIAGNOSIS — D631 Anemia in chronic kidney disease: Secondary | ICD-10-CM | POA: Diagnosis present

## 2022-06-02 DIAGNOSIS — S72492D Other fracture of lower end of left femur, subsequent encounter for closed fracture with routine healing: Secondary | ICD-10-CM | POA: Diagnosis not present

## 2022-06-02 DIAGNOSIS — N184 Chronic kidney disease, stage 4 (severe): Secondary | ICD-10-CM | POA: Diagnosis present

## 2022-06-02 DIAGNOSIS — Z9049 Acquired absence of other specified parts of digestive tract: Secondary | ICD-10-CM | POA: Diagnosis not present

## 2022-06-02 DIAGNOSIS — Z8249 Family history of ischemic heart disease and other diseases of the circulatory system: Secondary | ICD-10-CM

## 2022-06-02 DIAGNOSIS — H409 Unspecified glaucoma: Secondary | ICD-10-CM | POA: Diagnosis present

## 2022-06-02 DIAGNOSIS — I2510 Atherosclerotic heart disease of native coronary artery without angina pectoris: Secondary | ICD-10-CM | POA: Diagnosis present

## 2022-06-02 DIAGNOSIS — E1129 Type 2 diabetes mellitus with other diabetic kidney complication: Secondary | ICD-10-CM | POA: Diagnosis present

## 2022-06-02 DIAGNOSIS — L89309 Pressure ulcer of unspecified buttock, unspecified stage: Secondary | ICD-10-CM | POA: Insufficient documentation

## 2022-06-02 DIAGNOSIS — Z9181 History of falling: Secondary | ICD-10-CM

## 2022-06-02 DIAGNOSIS — Z7982 Long term (current) use of aspirin: Secondary | ICD-10-CM

## 2022-06-02 DIAGNOSIS — K5901 Slow transit constipation: Secondary | ICD-10-CM | POA: Diagnosis not present

## 2022-06-02 LAB — GLUCOSE, CAPILLARY
Glucose-Capillary: 111 mg/dL — ABNORMAL HIGH (ref 70–99)
Glucose-Capillary: 106 mg/dL — ABNORMAL HIGH (ref 70–99)
Glucose-Capillary: 127 mg/dL — ABNORMAL HIGH (ref 70–99)
Glucose-Capillary: 153 mg/dL — ABNORMAL HIGH (ref 70–99)
Glucose-Capillary: 99 mg/dL (ref 70–99)

## 2022-06-02 MED ORDER — FLEET ENEMA 7-19 GM/118ML RE ENEM
1.0000 | ENEMA | Freq: Once | RECTAL | Status: AC | PRN
  Administered 2022-06-11: 1 via RECTAL
  Filled 2022-06-02: qty 1

## 2022-06-02 MED ORDER — METOPROLOL TARTRATE 12.5 MG HALF TABLET
12.5000 mg | ORAL_TABLET | Freq: Every day | ORAL | Status: DC
  Administered 2022-06-03 – 2022-06-12 (×10): 12.5 mg via ORAL
  Filled 2022-06-02 (×10): qty 1

## 2022-06-02 MED ORDER — INSULIN ASPART 100 UNIT/ML IJ SOLN: 0.0000 [IU] | Freq: Three times a day (TID) | INTRAMUSCULAR | Status: DC

## 2022-06-02 MED ORDER — BRINZOLAMIDE 1 % OP SUSP
1.0000 [drp] | Freq: Two times a day (BID) | OPHTHALMIC | Status: DC
  Administered 2022-06-02 – 2022-06-22 (×40): 1 [drp] via OPHTHALMIC
  Filled 2022-06-02: qty 10

## 2022-06-02 MED ORDER — OXYCODONE HCL 5 MG PO TABS
5.0000 mg | ORAL_TABLET | ORAL | Status: DC | PRN
  Administered 2022-06-03 – 2022-06-19 (×12): 5 mg via ORAL
  Filled 2022-06-02 (×12): qty 1

## 2022-06-02 MED ORDER — GUAIFENESIN-DM 100-10 MG/5ML PO SYRP: 5.0000 mL | ORAL_SOLUTION | Freq: Four times a day (QID) | ORAL | Status: DC | PRN

## 2022-06-02 MED ORDER — ADULT MULTIVITAMIN W/MINERALS CH
1.0000 | ORAL_TABLET | Freq: Every day | ORAL | Status: DC
  Administered 2022-06-03 – 2022-06-22 (×20): 1 via ORAL
  Filled 2022-06-02 (×20): qty 1

## 2022-06-02 MED ORDER — VITAMIN B-12 1000 MCG PO TABS
500.0000 ug | ORAL_TABLET | Freq: Every day | ORAL | Status: DC
  Administered 2022-06-02 – 2022-06-22 (×21): 500 ug via ORAL
  Filled 2022-06-02 (×21): qty 1

## 2022-06-02 MED ORDER — BISACODYL 10 MG RE SUPP
10.0000 mg | Freq: Every day | RECTAL | Status: DC | PRN
  Administered 2022-06-11: 10 mg via RECTAL
  Filled 2022-06-02: qty 1

## 2022-06-02 MED ORDER — FERROUS SULFATE 325 (65 FE) MG PO TABS
325.0000 mg | ORAL_TABLET | Freq: Every day | ORAL | Status: DC
  Administered 2022-06-03 – 2022-06-22 (×20): 325 mg via ORAL
  Filled 2022-06-02 (×20): qty 1

## 2022-06-02 MED ORDER — TRAZODONE HCL 50 MG PO TABS
25.0000 mg | ORAL_TABLET | Freq: Every evening | ORAL | Status: DC | PRN
  Administered 2022-06-19: 50 mg via ORAL
  Filled 2022-06-02: qty 1

## 2022-06-02 MED ORDER — ALUM & MAG HYDROXIDE-SIMETH 200-200-20 MG/5ML PO SUSP: 15.0000 mL | ORAL | 0 refills | Status: DC | PRN

## 2022-06-02 MED ORDER — POLYETHYLENE GLYCOL 3350 17 G PO PACK
17.0000 g | PACK | Freq: Every day | ORAL | Status: DC
  Administered 2022-06-03 – 2022-06-22 (×18): 17 g via ORAL
  Filled 2022-06-02 (×20): qty 1

## 2022-06-02 MED ORDER — NEPRO/CARBSTEADY PO LIQD
237.0000 mL | Freq: Three times a day (TID) | ORAL | Status: DC
  Administered 2022-06-02 – 2022-06-09 (×19): 237 mL via ORAL

## 2022-06-02 MED ORDER — MORPHINE SULFATE (PF) 2 MG/ML IV SOLN
2.0000 mg | INTRAVENOUS | Status: DC | PRN
  Administered 2022-06-03: 2 mg via INTRAVENOUS
  Filled 2022-06-02: qty 1

## 2022-06-02 MED ORDER — ASPIRIN 81 MG PO TBEC
81.0000 mg | DELAYED_RELEASE_TABLET | Freq: Every day | ORAL | Status: DC
  Administered 2022-06-03 – 2022-06-22 (×20): 81 mg via ORAL
  Filled 2022-06-02 (×20): qty 1

## 2022-06-02 MED ORDER — TIMOLOL MALEATE 0.5 % OP SOLN
1.0000 [drp] | Freq: Two times a day (BID) | OPHTHALMIC | Status: DC
  Administered 2022-06-02 – 2022-06-22 (×40): 1 [drp] via OPHTHALMIC
  Filled 2022-06-02: qty 5

## 2022-06-02 MED ORDER — BISACODYL 5 MG PO TBEC: 5.0000 mg | DELAYED_RELEASE_TABLET | Freq: Every day | ORAL | 0 refills | Status: DC | PRN

## 2022-06-02 MED ORDER — VITAMIN D (ERGOCALCIFEROL) 1.25 MG (50000 UNIT) PO CAPS
50000.0000 [IU] | ORAL_CAPSULE | ORAL | Status: DC
  Administered 2022-06-04 – 2022-06-18 (×3): 50000 [IU] via ORAL
  Filled 2022-06-02 (×3): qty 1

## 2022-06-02 MED ORDER — DIPHENHYDRAMINE HCL 12.5 MG/5ML PO ELIX: 12.5000 mg | ORAL_SOLUTION | Freq: Four times a day (QID) | ORAL | Status: DC | PRN

## 2022-06-02 MED ORDER — ACETAMINOPHEN 325 MG PO TABS
650.0000 mg | ORAL_TABLET | Freq: Three times a day (TID) | ORAL | Status: DC
  Administered 2022-06-02 – 2022-06-22 (×60): 650 mg via ORAL
  Filled 2022-06-02 (×58): qty 2

## 2022-06-02 MED ORDER — ACETAMINOPHEN 325 MG PO TABS
325.0000 mg | ORAL_TABLET | ORAL | Status: DC | PRN
  Filled 2022-06-02: qty 2

## 2022-06-02 MED ORDER — POLYETHYLENE GLYCOL 3350 17 G PO PACK: 17.0000 g | PACK | Freq: Every day | ORAL | Status: DC | PRN

## 2022-06-02 MED ORDER — ALUM & MAG HYDROXIDE-SIMETH 200-200-20 MG/5ML PO SUSP: 30.0000 mL | ORAL | Status: DC | PRN

## 2022-06-02 MED ORDER — BRIMONIDINE TARTRATE 0.2 % OP SOLN
1.0000 [drp] | Freq: Two times a day (BID) | OPHTHALMIC | Status: DC
  Administered 2022-06-02 – 2022-06-22 (×40): 1 [drp] via OPHTHALMIC
  Filled 2022-06-02: qty 5

## 2022-06-02 MED ORDER — PROCHLORPERAZINE 25 MG RE SUPP: 12.5000 mg | Freq: Four times a day (QID) | RECTAL | Status: DC | PRN

## 2022-06-02 MED ORDER — PROCHLORPERAZINE MALEATE 5 MG PO TABS: 5.0000 mg | ORAL_TABLET | Freq: Four times a day (QID) | ORAL | Status: DC | PRN

## 2022-06-02 MED ORDER — METOPROLOL TARTRATE 12.5 MG HALF TABLET: 12.5000 mg | ORAL_TABLET | Freq: Every day | ORAL | Status: DC

## 2022-06-02 MED ORDER — OXYCODONE HCL 5 MG PO TABS: 5.0000 mg | ORAL_TABLET | ORAL | 0 refills | Status: DC | PRN

## 2022-06-02 MED ORDER — ALBUTEROL SULFATE (2.5 MG/3ML) 0.083% IN NEBU
3.0000 mL | INHALATION_SOLUTION | RESPIRATORY_TRACT | Status: DC | PRN
  Administered 2022-06-11 – 2022-06-17 (×3): 3 mL via RESPIRATORY_TRACT
  Filled 2022-06-02 (×2): qty 9
  Filled 2022-06-02: qty 3

## 2022-06-02 MED ORDER — METHOCARBAMOL 500 MG PO TABS
500.0000 mg | ORAL_TABLET | Freq: Four times a day (QID) | ORAL | Status: DC | PRN
  Administered 2022-06-04 – 2022-06-18 (×7): 500 mg via ORAL
  Filled 2022-06-02 (×8): qty 1

## 2022-06-02 MED ORDER — INSULIN ASPART 100 UNIT/ML IJ SOLN: 0.0000 [IU] | Freq: Every day | INTRAMUSCULAR | Status: DC

## 2022-06-02 MED ORDER — METHOCARBAMOL 1000 MG/10ML IJ SOLN: 500.0000 mg | Freq: Four times a day (QID) | INTRAVENOUS | Status: DC | PRN

## 2022-06-02 MED ORDER — PROCHLORPERAZINE EDISYLATE 10 MG/2ML IJ SOLN: 5.0000 mg | Freq: Four times a day (QID) | INTRAMUSCULAR | Status: DC | PRN

## 2022-06-02 NOTE — Discharge Instructions (Signed)
Orthopaedic DC instructions   Dressing changes as needed Bilateral lower extremities Ok to leave open to the air  Clean with soap and water only   Unrestricted ROM B knees   Weightbearing as tolerated Bilateral lower extremities for TRANSFERS ONLY!!! NO AMBULATION   Follow up with orthopaedics about 10 days from discharge from hospital

## 2022-06-02 NOTE — Progress Notes (Signed)
Patient ID: Gabriela [name], female   DOB: 09-21-1925, 88 y.o.   MRN: [id number] Met with the patient to review current situation, rehab process, team conference and plan of care. Patient confirmed she will have son and extended family to assist at discharge. Mild pain at present post bil ORIF, dressing right thigh wound. Reviewed secondary risks including DM (A1C 6.2) reports no longer taking insulin, HTN, HLD, and medications. Also reviewed dietary modification recommendations and nutritional supplements. Continue to follow along to address educational needs to facilitate preparation for discharge home. Margarito Liner

## 2022-06-02 NOTE — Plan of Care (Signed)
Problem: Education: Goal: Knowledge of General Education information will improve Description: Including pain rating scale, medication(s)/side effects and non-pharmacologic comfort measures 06/02/2022 1134 by Bridgette Habermann, RN Outcome: Adequate for Discharge 06/02/2022 1134 by Bridgette Habermann, RN Outcome: Progressing   Problem: Health Behavior/Discharge Planning: Goal: Ability to manage health-related needs will improve 06/02/2022 1134 by Bridgette Habermann, RN Outcome: Adequate for Discharge 06/02/2022 1134 by Bridgette Habermann, RN Outcome: Progressing   Problem: Clinical Measurements: Goal: Ability to maintain clinical measurements within normal limits will improve 06/02/2022 1134 by Bridgette Habermann, RN Outcome: Adequate for Discharge 06/02/2022 1134 by Bridgette Habermann, RN Outcome: Progressing Goal: Will remain free from infection 06/02/2022 1134 by Bridgette Habermann, RN Outcome: Adequate for Discharge 06/02/2022 1134 by Bridgette Habermann, RN Outcome: Progressing Goal: Diagnostic test results will improve 06/02/2022 1134 by Bridgette Habermann, RN Outcome: Adequate for Discharge 06/02/2022 1134 by Bridgette Habermann, RN Outcome: Progressing Goal: Respiratory complications will improve 06/02/2022 1134 by Bridgette Habermann, RN Outcome: Adequate for Discharge 06/02/2022 1134 by Bridgette Habermann, RN Outcome: Progressing Goal: Cardiovascular complication will be avoided 06/02/2022 1134 by Bridgette Habermann, RN Outcome: Adequate for Discharge 06/02/2022 1134 by Bridgette Habermann, RN Outcome: Progressing   Problem: Activity: Goal: Risk for activity intolerance will decrease 06/02/2022 1134 by Bridgette Habermann, RN Outcome: Adequate for Discharge 06/02/2022 1134 by Bridgette Habermann, RN Outcome: Progressing   Problem: Nutrition: Goal: Adequate nutrition will be maintained 06/02/2022 1134 by Bridgette Habermann, RN Outcome: Adequate for Discharge 06/02/2022 1134 by Bridgette Habermann, RN Outcome: Progressing   Problem: Coping: Goal: Level of anxiety will decrease 06/02/2022 1134 by Bridgette Habermann, RN Outcome: Adequate for Discharge 06/02/2022 1134 by Bridgette Habermann, RN Outcome: Progressing   Problem: Elimination: Goal: Will not experience complications related to bowel motility 06/02/2022 1134 by Bridgette Habermann, RN Outcome: Adequate for Discharge 06/02/2022 1134 by Bridgette Habermann, RN Outcome: Progressing Goal: Will not experience complications related to urinary retention 06/02/2022 1134 by Bridgette Habermann, RN Outcome: Adequate for Discharge 06/02/2022 1134 by Bridgette Habermann, RN Outcome: Progressing   Problem: Pain Managment: Goal: General experience of comfort will improve 06/02/2022 1134 by Bridgette Habermann, RN Outcome: Adequate for Discharge 06/02/2022 1134 by Bridgette Habermann, RN Outcome: Progressing   Problem: Safety: Goal: Ability to remain free from injury will improve 06/02/2022 1134 by Bridgette Habermann, RN Outcome: Adequate for Discharge 06/02/2022 1134 by Bridgette Habermann, RN Outcome: Progressing   Problem: Skin Integrity: Goal: Risk for impaired skin integrity will decrease 06/02/2022 1134 by Bridgette Habermann, RN Outcome: Adequate for Discharge 06/02/2022 1134 by Bridgette Habermann, RN Outcome: Progressing   Problem: Education: Goal: Ability to describe self-care measures that may prevent or decrease complications (Diabetes Survival Skills Education) will improve 06/02/2022 1134 by Bridgette Habermann, RN Outcome: Adequate for Discharge 06/02/2022 1134 by Bridgette Habermann, RN Outcome: Progressing Goal: Individualized Educational Video(s) 06/02/2022 1134 by Bridgette Habermann, RN Outcome: Adequate for Discharge 06/02/2022 1134 by Bridgette Habermann, RN Outcome: Progressing   Problem: Coping: Goal: Ability to adjust to condition or change in health will improve 06/02/2022 1134 by Bridgette Habermann, RN Outcome: Adequate for  Discharge 06/02/2022 1134 by Bridgette Habermann, RN Outcome: Progressing   Problem: Fluid Volume: Goal: Ability to maintain a balanced intake and output will improve 06/02/2022 1134 by Bridgette Habermann, RN Outcome: Adequate for Discharge 06/02/2022 1134 by Kary Kos,  Emily Forse A, RN Outcome: Progressing   Problem: Health Behavior/Discharge Planning: Goal: Ability to identify and utilize available resources and services will improve 06/02/2022 1134 by Bridgette Habermann, RN Outcome: Adequate for Discharge 06/02/2022 1134 by Bridgette Habermann, RN Outcome: Progressing Goal: Ability to manage health-related needs will improve 06/02/2022 1134 by Bridgette Habermann, RN Outcome: Adequate for Discharge 06/02/2022 1134 by Bridgette Habermann, RN Outcome: Progressing   Problem: Metabolic: Goal: Ability to maintain appropriate glucose levels will improve 06/02/2022 1134 by Bridgette Habermann, RN Outcome: Adequate for Discharge 06/02/2022 1134 by Bridgette Habermann, RN Outcome: Progressing   Problem: Nutritional: Goal: Maintenance of adequate nutrition will improve 06/02/2022 1134 by Bridgette Habermann, RN Outcome: Adequate for Discharge 06/02/2022 1134 by Bridgette Habermann, RN Outcome: Progressing Goal: Progress toward achieving an optimal weight will improve 06/02/2022 1134 by Bridgette Habermann, RN Outcome: Adequate for Discharge 06/02/2022 1134 by Bridgette Habermann, RN Outcome: Progressing   Problem: Skin Integrity: Goal: Risk for impaired skin integrity will decrease 06/02/2022 1134 by Bridgette Habermann, RN Outcome: Adequate for Discharge 06/02/2022 1134 by Bridgette Habermann, RN Outcome: Progressing   Problem: Tissue Perfusion: Goal: Adequacy of tissue perfusion will improve 06/02/2022 1134 by Bridgette Habermann, RN Outcome: Adequate for Discharge 06/02/2022 1134 by Bridgette Habermann, RN Outcome: Progressing
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
## 2022-06-02 NOTE — Progress Notes (Signed)
Signed     PMR Admission Coordinator Pre-Admission Assessment   [name] is an 88 y.o., female MRN: [id number] DOB: 06-08-1926 Height: '5\' 4"'  (162.6 cm) Weight: 56 kg   Insurance Information HMO: yes    PPO:      PCP:      IPA:      80/20:      OTHER:  PRIMARY: [employer] MCR      Policy#: [id number]      Subscriber: patient CM Name:       Phone#:      Fax#: [phone] Pre-Cert#: [id number]   Appeal overturned. Received approval from [place] on 06/02/22. Pt approved for 7 days 06/02/22-06/08/22   Employer:  Benefits:  Phone #: [email or web address]     Name:  Irene Shipper Date: 08/15/2021 - still active Deductible: does not have one OOP Max: $8,300 ($1,797.46 met) CIR: $1,556/ admission copay SNF: $0.00 Copayment per day for days 1-20; $200 Copayment per day for days 21-100; Maximum of 100 days/benefit period Outpatient: 80% coverage; 20% co-insurance Home Health:  100% coverage, limited by medical necessity DME: 80% coverage; 20% co-insurance Providers: in network  SECONDARY: [place]      Policy#:   [id number] n   Phone#: [phone]   Financial Counselor:       Phone#:    The "Data Collection Information Summary" for patients in Inpatient Rehabilitation Facilities with attached "Privacy Act [place] Records" was provided and verbally reviewed with: Patient and Family   Emergency Contact Information Contact Information       Name Relation Home Work Mobile    [place] Granddaughter [phone]   [phone]    Merlene Laughter [phone]   [place] Granddaughter [phone]   [phone]           Current Medical History  Patient Admitting Diagnosis: Bilateral femur fx, s/p ORIF R femur fx and ORIF L femur fx History of Present Illness: Gabriela [name] is a 88 y.o. female with medical history significant of DM; stage 4 CKD; and pancytopenia presenting with a fall.  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  She was treated with Cefepime -> Cipro and discharged to home with home therapy services.  She has been doing alright and was getting around well.  She was doing PT at home Tuesday {TA and her left leg (her "good leg) gave out on her.  She fell onto the concrete floor.  She is still taking Cipro for suppressive therapy, 8-9 pills left; the doctor at [facility] switched her antibiotic.  ID has followed her since 2016 for recurrent infections, Dr. Delaine Lame Patient admitted to [facility] 05/26/22 and found to have a communicated fracture of the distal femoral metaphysis of the right leg as well as L femur fracture.   Dr. Marcelino Scot orthopedics consulted at [facility] due to the complexity of case.  Of note patient's hemoglobin was proximately 1 g lower than prior at 7.3 and platelet count 57.  No reports of bleeding. Pt. Transferred to [facility] for repair. He underwent ORIF of R femur fx and ORIF of L femur fx on 10/12. PT/OT consulted and recommended CIR to assist return to PLOF.     Patient's medical record from [facility] has been reviewed by the rehabilitation admission coordinator and physician.   Past Medical History      Past Medical History:  Diagnosis Date  Arthritis     Diabetes mellitus (HCC)     Glaucoma     Hyperlipidemia     Hypertension     Shortness of breath      with exertion      Has the patient had major surgery during 100 days prior to admission? Yes   Family History   family history includes Diabetes in her sister; Hypertension in her mother.   Current Medications   Current Facility-Administered Medications:    acetaminophen (TYLENOL) tablet 650 mg, 650 mg, Oral, Q8H, Ainsley Spinner, PA-C, 650 mg at 06/02/22 0851   alum & mag hydroxide-simeth (MAALOX/MYLANTA) 200-200-20 MG/5ML suspension 15 mL, 15 mL, Oral, Once, Kathryne Eriksson, NP   aspirin EC tablet 81 mg, 81 mg, Oral, Daily, Ainsley Spinner, PA-C, 81 mg at 06/02/22 9528   bisacodyl (DULCOLAX) EC tablet 5 mg, 5 mg, Oral, Daily PRN, Ainsley Spinner, PA-C   brinzolamide (AZOPT) 1 % ophthalmic suspension 1 drop, 1 drop, Both Eyes, BID, 1 drop at 06/02/22 0856 **AND** brimonidine (ALPHAGAN) 0.2 % ophthalmic solution 1 drop, 1 drop, Both Eyes, BID, Pham, Minh Q, RPH-CPP, 1 drop at 06/02/22 0857   calcium carbonate (TUMS - dosed in mg elemental calcium) chewable tablet 200 mg of elemental calcium, 1 tablet, Oral, TID PRN, Caren Griffins, MD   docusate sodium (COLACE) capsule 100 mg, 100 mg, Oral, BID, Ainsley Spinner, PA-C, 100 mg at 06/01/22 1000   feeding supplement (GLUCERNA SHAKE) (GLUCERNA SHAKE) liquid 237 mL, 237 mL, Oral, TID BM, Gherghe, Costin M, MD, 237 mL at 06/02/22 0855   insulin aspart (novoLOG) injection 0-5 Units, 0-5 Units, Subcutaneous, QHS, Gherghe, Costin M, MD   insulin aspart (novoLOG) injection 0-9 Units, 0-9 Units, Subcutaneous, TID WC, Caren Griffins, MD, 1 Units at 05/30/22 1736   methocarbamol (ROBAXIN) tablet 500 mg, 500 mg, Oral, Q6H PRN, 500 mg at 05/31/22 0903 **OR** methocarbamol (ROBAXIN) 500 mg in dextrose 5 % 50 mL IVPB, 500 mg, Intravenous, Q6H PRN, Ainsley Spinner, PA-C   metoprolol tartrate (LOPRESSOR) tablet 12.5 mg, 12.5 mg, Oral, Daily, Ainsley Spinner, PA-C, 12.5 mg at 06/02/22 4132   morphine (PF) 2 MG/ML injection 2 mg, 2 mg, Intravenous, Q2H PRN, Ainsley Spinner, PA-C, 2 mg at 05/31/22 1035   multivitamin with minerals tablet 1 tablet, 1 tablet, Oral, Daily, Caren Griffins, MD, 1 tablet at 06/02/22 0852   ondansetron (ZOFRAN) injection 4 mg, 4 mg, Intravenous, Q6H PRN, Ainsley Spinner, PA-C   oxyCODONE (Oxy IR/ROXICODONE) immediate release tablet 5 mg, 5 mg, Oral, Q4H PRN, Ainsley Spinner, PA-C, 5 mg at 06/02/22 0851   polyethylene glycol (MIRALAX / GLYCOLAX) packet 17 g, 17 g, Oral, BID, Gherghe, Costin M, MD, 17 g at 06/01/22 1003   timolol (TIMOPTIC) 0.5 % ophthalmic solution 1 drop, 1 drop, Both Eyes, BID,  Ainsley Spinner, PA-C, 1 drop at 06/02/22 4401   Vitamin D (Ergocalciferol) (DRISDOL) 1.25 MG (50000 UNIT) capsule 50,000 Units, 50,000 Units, Oral, Q7 days, McClung, Sarah A, PA-C   Patients Current Diet:  Diet Order                  Diet - low sodium heart healthy             Diet Carb Modified Fluid consistency: Thin; Room service appropriate? Yes  Diet effective now                         Precautions /  Restrictions Precautions Precautions: Fall Precaution Comments: HOH, glaucoma Restrictions Weight Bearing Restrictions: Yes RLE Weight Bearing: Weight bearing as tolerated LLE Weight Bearing: Weight bearing as tolerated Other Position/Activity Restrictions: Transfers only    Has the patient had 2 or more falls or a fall with injury in the past year? Yes   Prior Activity Level Community (5-7x/wk): Pt went out regularly   Prior Functional Level Self Care: Did the patient need help bathing, dressing, using the toilet or eating? Independent   Indoor Mobility: Did the patient need assistance with walking from room to room (with or without device)? Independent   Stairs: Did the patient need assistance with internal or external stairs (with or without device)? Independent   Functional Cognition: Did the patient need help planning regular tasks such as shopping or remembering to take medications? Needed some help   Patient Information Are you of Hispanic, Latino/a,or Spanish origin?: A. No, not of Hispanic, Latino/a, or Spanish origin What is your race?: B. Black or African American Do you need or want an interpreter to communicate with a doctor or health care staff?: 0. No   Patient's Response To:  Health Literacy and Transportation Is the patient able to respond to health literacy and transportation needs?: Yes Health Literacy - How often do you need to have someone help you when you read instructions, pamphlets, or other written material from your doctor or pharmacy?:  Sometimes In the past 12 months, has lack of transportation kept you from medical appointments or from getting medications?: No In the past 12 months, has lack of transportation kept you from meetings, work, or from getting things needed for daily living?: No   Home Assistive Devices / [place] Devices/Equipment: Environmental consultant (specify type) Home Equipment: Conservation officer, nature (2 wheels), Rollator (4 wheels), Transport chair   Prior Device Use: Indicate devices/aids used by the patient prior to current illness, exacerbation or injury? None of the above   Current Functional Level Cognition   Overall Cognitive Status: Within Functional Limits for tasks assessed Orientation Level: Oriented X4 General Comments: Family endorses she is cognitively at baseline    Extremity Assessment (includes Sensation/Coordination)   Upper Extremity Assessment: Generalized weakness  Lower Extremity Assessment: Defer to PT evaluation RLE Deficits / Details: chronic wound to R upper thigh; limited ROM due to recent sx and pain. Difficult to fully assess strength due to pain LLE Deficits / Details: Limited ROM due to recent sx and pain; unable to fully assess due to pain     ADLs   Overall ADL's : Needs assistance/impaired Eating/Feeding: Set up Grooming: Set up Upper Body Bathing: Minimal assistance, Sitting Lower Body Bathing: Total assistance, +2 for physical assistance, Sit to/from stand Upper Body Dressing : Minimal assistance, Sitting Lower Body Dressing: Total assistance, +2 for physical assistance, Sit to/from stand Toilet Transfer:  (unable) Toileting- Clothing Manipulation and Hygiene: +2 for safety/equipment, Total assistance General ADL Comments: Unable to come to full stand- max +2 from EOB. Lateral scoot with max A     Mobility   Overal bed mobility: Needs Assistance Bed Mobility: Supine to Sit Supine to sit: Min assist, Mod assist Sit to supine: Mod assist, Max assist, +2 for  physical assistance General bed mobility comments: Got up to R side of bed today, which pt is more familiar with; very nice initiation, moved bil les to EOB without assist and used top bedrail on her R with 2 hands to turn torso and elevate trunk to sit; Mod assist  and use of bed pad to square off hips at EOB     Transfers   Overall transfer level: Needs assistance Equipment used:  (bed pad) Transfers: Bed to chair/wheelchair/BSC Sit to Stand: Max assist, +2 physical assistance Bed to/from chair/wheelchair/BSC transfer type:: Lateral/scoot transfer  Lateral/Scoot Transfers: Mod assist, +2 safety/equipment, Max assist Transfer via Lift Equipment: Stedy General transfer comment: heavy mod assist and bed pad to cradle hips during scoot transfer towards her L; Demonstrating good weight shift anteriorly, and incr weight acceptance of bil feet on floor; Max assist to scoot hips completely to center of recliner, and back of the seat for optimal positioning     Ambulation / Gait / Stairs / Wheelchair Mobility         Posture / Balance Balance Overall balance assessment: Needs assistance Sitting-balance support: No upper extremity supported, Feet supported Sitting balance-Leahy Scale: Fair Standing balance support: During functional activity, Bilateral upper extremity supported Standing balance-Leahy Scale: Zero     Special needs/care consideration Diabetic Management: novoLOG 0-5 units daily at bedtime; novoLOG 0-9 units 3x daily with meals; Bladder incontinence; External Urinary catheter; Surgical incision: thigh; Wound- thigh/right, anterior, proximal    Previous Home Environment (from acute therapy documentation) Living Arrangements: Alone  Lives With: Spouse Available Help at Discharge: Family, Available PRN/intermittently Type of Home: House Home Layout: One level Home Access: Level entry Bathroom Shower/Tub: Chiropodist: Standard Bathroom Accessibility: Yes How  Accessible: Accessible via walker, Accessible via wheelchair [place]: Yes Type of Home Care Services: Homehealth aide, Home PT, Home RN, [place] (if known): [place] Additional Comments: Two supportive family members willing to provide 24/7 if needed   Discharge Living Setting Plans for Discharge Living Setting: Patient's home Type of Home at Discharge: House Discharge Home Layout: One level Discharge Home Access: Level entry Discharge Bathroom Shower/Tub: [facility] unit Discharge Bathroom Toilet: Standard Discharge Bathroom Accessibility: Yes How Accessible: Accessible via wheelchair, Accessible via walker Does the patient have any problems obtaining your medications?: No   Social/Family/Support Systems Patient Roles: Other (Comment) Contact Information: [phone] Anticipated Caregiver: 24/7 Ability/Limitations of Caregiver: Min A Caregiver Availability: 24/7 Discharge Plan Discussed with Primary Caregiver: Yes Is Caregiver In Agreement with Plan?: Yes Does Caregiver/Family have Issues with Lodging/Transportation while Pt is in Rehab?: No   Goals Patient/Family Goal for Rehab: PT/OT MIN A Expected length of stay: 18-21 days Pt/Family Agrees to Admission and willing to participate: Yes Program Orientation Provided & Reviewed with Pt/Caregiver Including Roles  & Responsibilities: Yes   Decrease burden of Care through IP rehab admission:n/a    Possible need for SNF placement upon discharge: not anticipated    Patient Condition: I have reviewed medical records from [facility] , spoken with CM, and patient and family member. I met with patient at the bedside for inpatient rehabilitation assessment.  Patient will benefit from ongoing PT and OT, can actively participate in 3 hours of therapy a day 5 days of the week, and can make measurable gains during the admission.  Patient will also benefit from the coordinated  team approach during an Inpatient Acute Rehabilitation admission.  The patient will receive intensive therapy as well as Rehabilitation physician, nursing, social worker, and care management interventions.  Due to bladder management, bowel management, safety, skin/wound care, disease management, medication administration, pain management, and patient education the patient requires 24 hour a day rehabilitation nursing.  The patient is currently Mod-Max A  with  mobility and basic ADLs.  Discharge setting and therapy post discharge at home with home health is anticipated.  Patient has agreed to participate in the Acute Inpatient Rehabilitation Program and will admit today.   Preadmission Screen Completed By:  Genella Mech, with updates by Gayland Curry 06/02/2022 10:31 AM ______________________________________________________________________   Discussed status with Dr. Tressa Busman on 06/02/22 at 10:31 AM and received approval for admission today.   Admission Coordinator:  Genella Mech, CCC-SLP, with updates by Gayland Curry, MS, CCC-SLP time 10:31 AM/Date 06/02/22     Assessment/Plan: Diagnosis: Does the need for close, 24 hr/day Medical supervision in concert with the patient's rehab needs make it unreasonable for this patient to be served in a less intensive setting? Yes Co-Morbidities requiring supervision/potential complications: Post-op wound monitorring, pain control, CKD stage IV, hypokalemia, anemia, artherosclerotic heart disease s/p CABG Due to bladder management, bowel management, safety, skin/wound care, disease management, medication administration, pain management, and patient education, does the patient require 24 hr/day rehab nursing? Yes Does the patient require coordinated care of a physician, rehab nurse, PT, OT  to address physical and functional deficits in the context of the above medical diagnosis(es)? Yes Addressing deficits in the following areas: balance, endurance,  locomotion, strength, transferring, bowel/bladder control, bathing, dressing, feeding, grooming, and toileting Can the patient actively participate in an intensive therapy program of at least 3 hrs of therapy 5 days a week? Yes The potential for patient to make measurable gains while on inpatient rehab is excellent Anticipated functional outcomes upon discharge from inpatient rehab: Min A PT, Min A OT Estimated rehab length of stay to reach the above functional goals is: 18-21 days Anticipated discharge destination: Home 10. Overall Rehab/Functional Prognosis: excellent     MD Signature:   Gertie Gowda, DO 06/02/2022

## 2022-06-02 NOTE — Progress Notes (Signed)
Inpatient Rehab Admissions Coordinator:  There is a bed available for pt in CIR today. Dr. Aileen Fass is aware and in agreement. Pt, pt's granddaughter [name], NSG, and TOC aware.     Gayland Curry, [place], [place] Admissions Coordinator [phone]

## 2022-06-02 NOTE — Progress Notes (Addendum)
Inpatient Rehabilitation Admission Medication Review by a Pharmacist  A complete drug regimen review was completed for this patient to identify any potential clinically significant medication issues.  High Risk Drug Classes Is patient taking? Indication by Medication  Antipsychotic Yes Compazine-nausea  Anticoagulant No   Antibiotic No   Opioid Yes Oxycodone, morphine IV-pain  Antiplatelet Yes Aspirin-CAD  Hypoglycemics/insulin Yes Novolog SSI-hyperglycemia  Vasoactive Medication yes Metoprolol-HTN  Chemotherapy No   Other Yes Albuterol-wheezing Vitamin B12-deficiency Ferrous sulfate-deficiency Brinzolamide eye drops-glaucoma Brimonidine eye drops-glaucoma Timolol eye drops-Glaucoma Robaxin-spasms Miralax-constipation Benadryl-allergies Trazadone prn-sleep Maalox-indigestion     Type of Medication Issue Identified Description of Issue Recommendation(s)  Drug Interaction(s) (clinically significant)     Duplicate Therapy     Allergy     No Medication Administration End Date     Incorrect Dose     Additional Drug Therapy Needed     Significant med changes from prior encounter (inform family/care partners about these prior to discharge). Patient told to continue low dose lasix on discharge.  Please evaluate need for lasix and continue if needed.   Other       Clinically significant medication issues were identified that warrant physician communication and completion of prescribed/recommended actions by midnight of the next day:  No  Name of provider notified for urgent issues identified: Reesa Chew  Provider Method of Notification: secure message    Pharmacist comments: metoprolol restarted per P. Love PA. Continue to hold lasix  Time spent performing this drug regimen review (minutes):  20   [name] A. Levada Dy, PharmD, BCPS, FNKF Clinical Pharmacist [place] Please utilize Amion for appropriate phone number to reach the unit pharmacist ([place])  06/02/2022 12:57 PM

## 2022-06-02 NOTE — Discharge Summary (Signed)
Physician Discharge Summary  Gabriela [name] JHE:[id number] DOB: 1926/03/08 DOA: 05/26/2022  PCP: [name], MD  Admit date: 05/26/2022 Discharge date: 06/02/2022  Admitted From: Home Disposition:  CIR  Recommendations for Outpatient Follow-up:  Follow up with PCP in 1-2 weeks. Please obtain BMP/CBC in one week Please obtain a basic metabolic panel at inpatient rehab next week to follow-up on potassium   Home Health:No Equipment/Devices:None  Discharge Condition:Stable CODE STATUS:Full Diet recommendation: Heart Healthy  Brief/Interim Summary: 88 y.o. female past medical history of diabetes mellitus type 2, chronic kidney disease stage IV, chronic pancytopenia comes into the hospital after a fall and was with bilateral femur fractures transferred to [facility] status post surgical intervention on 05/26/2022  Discharge Diagnoses:  Principal Problem:   Femur fracture, right ([place]) Active Problems:   Chronic wound   Type 2 diabetes mellitus with renal manifestations (HCC)   Thrombocytopenia (HCC)   CAD (coronary artery disease)   Hyperlipidemia   Glaucoma   MGUS (monoclonal gammopathy of unknown significance)   Femur fracture, left (HCC)   CKD (chronic kidney disease) stage 4, GFR 15-29 ml/min (HCC)   Fever   Leukocytosis  Right supracondylar femur fracture/left supracondylar femur fracture with ventricular extension: Orthopedic surgery was consulted who performed ORIF on the right and Biomet NCB plate on the left. Orthopedic surgery recommended weightbearing as tolerated Lovenox for DVT prophylaxis. Physical therapy evaluated patient recommended inpatient rehab.  History of right thigh wound Pseudomonas infection, chronic osteomyelitis surrounding the acetabular cup: The previous physician spoke with ID who recommended no further antibiotics.  Chronic kidney disease stage IV: Creatinine remained at baseline.  Hypokalemia: It was repleted orally  potassium became high she was given oral Kayexalate and her potassium started to trend down. She will have a BMP check in 1 week at rehab.  History of CABG: Stable.  Glaucoma: Continue eyedrops  Anemia of chronic disease: She is status post 1 unit of packed red blood cells her hemoglobin has remained stable.  Marland Kitchen   Discharge Instructions  Discharge Instructions     Diet - low sodium heart healthy   Complete by: As directed    Increase activity slowly   Complete by: As directed    No wound care   Complete by: As directed       Allergies as of 06/02/2022       Reactions   Sulfa Antibiotics Rash        Medication List     STOP taking these medications    ciprofloxacin 500 MG tablet Commonly known as: CIPRO       TAKE these medications    Accu-Chek Aviva Plus test strip Generic drug: glucose blood USE AS INSTRUCTED DX E11.9   Accu-Chek Aviva Plus w/Device Kit 1 each by Does not apply route daily. Dx E11.9   Accu-Chek FastClix Lancets Misc To check blood sugars once a day. DX E11.9   albuterol 108 (90 Base) MCG/ACT inhaler Commonly known as: VENTOLIN HFA INHALE 1-2 PUFFS INTO THE LUNGS EVERY 4 HOURS AS NEEDED FOR WHEEZING OR SHORTNESS OF BREATH. What changed: See the new instructions.   alum & mag hydroxide-simeth 200-200-20 MG/5ML suspension Commonly known as: MAALOX/MYLANTA Take 15 mLs by mouth every 4 (four) hours as needed for indigestion or heartburn.   aspirin EC 81 MG tablet Take 81 mg by mouth daily.   bisacodyl 5 MG EC tablet Commonly known as: DULCOLAX Take 1 tablet (5 mg total) by mouth daily as needed for moderate  constipation.   cyanocobalamin 1000 MCG tablet Take 0.5 tablets (500 mcg total) by mouth daily.   ferrous sulfate 325 (65 FE) MG tablet Take 1 tablet (325 mg total) by mouth daily with breakfast. TAKE 1 TABLET BY MOUTH DAILY.   furosemide 20 MG tablet Commonly known as: LASIX TAKE 1 TABLET BY MOUTH EVERY DAY    metoprolol tartrate 25 MG tablet Commonly known as: LOPRESSOR TAKE 1/2 TABLET BY MOUTH EVERY DAY   oxyCODONE 5 MG immediate release tablet Commonly known as: Oxy IR/ROXICODONE Take 1 tablet (5 mg total) by mouth every 4 (four) hours as needed for moderate pain.   Simbrinza 1-0.2 % Susp Generic drug: Brinzolamide-Brimonidine Place 1 drop into both eyes 2 (two) times daily.   timolol 0.5 % ophthalmic solution Commonly known as: TIMOPTIC Place 1 drop into both eyes 2 (two) times daily.        Allergies  Allergen Reactions   Sulfa Antibiotics Rash    Consultations: Orthopedic surgery   Procedures/Studies: CT HIP RIGHT WO CONTRAST  Result Date: 05/28/2022 CLINICAL DATA:  Septic arthritis suspected, hip, xray done EXAM: CT OF THE RIGHT HIP WITHOUT CONTRAST TECHNIQUE: Multidetector CT imaging of the right hip was performed according to the standard protocol. Multiplanar CT image reconstructions were also generated. RADIATION DOSE REDUCTION: This exam was performed according to the departmental dose-optimization program which includes automated exposure control, adjustment of the mA and/or kV according to patient size and/or use of iterative reconstruction technique. COMPARISON:  CT 04/15/2022 FINDINGS: Bones/Joint/Cartilage Postsurgical changes from right total hip arthroplasty. Femoral head is slightly superiorly located within the acetabular cup suggesting a component of polyethylene wear. No dislocation. No periprosthetic fracture. Chronic extensive osteolysis surrounding the acetabular cup with large defect of the medial acetabular wall. No pelvic diastasis. Ligaments Suboptimally assessed by CT. Muscles and Tendons Chronic fluid collection along the anterior aspect of the right hip is again noted, likely partially contained within the iliopsoas bursa measuring approximately 10.0 x 4.2 x 5.0 cm (series 5, image 53; series 7, image 37), previously measured approximately 9.0 x 3.0 x 5.0  cm on 04/15/2022. Collection tracks into the subcutaneous soft tissues of the anterior proximal thigh. Generalized muscle atrophy. Soft tissues Chronic fluid collection, as above. No additional fluid collections. Extensive atherosclerotic vascular calcification. IMPRESSION: 1. Postsurgical changes from right total hip arthroplasty. Femoral head is slightly superiorly located within the acetabular cup suggesting a component of polyethylene wear. Chronic osteolysis surrounding the acetabular cup. No acute periprosthetic fracture identified. 2. Chronic fluid collection along the anterior aspect of the right hip is again noted, likely partially contained within the iliopsoas bursa measuring approximately 10.0 x 4.2 x 5.0 cm, minimally increased from 04/15/2022. Collection tracks into the subcutaneous soft tissues of the anterior proximal thigh. Electronically Signed   By: Davina Poke D.O.   On: 05/28/2022 09:07   DG Knee Right Port  Result Date: 05/26/2022 CLINICAL DATA:  Left and right knee fractures, postop exam. EXAM: PORTABLE RIGHT KNEE - 1-2 VIEW COMPARISON:  05/25/2022. FINDINGS: There is a comminuted fracture of the distal femoral diaphysis with interval placement of fixation hardware. Alignment is improved. Air and fluid is noted in the joint capsule in the knee. There are vascular calcifications in the soft tissues. A right hip prosthesis is noted. IMPRESSION: Comminuted fracture of the distal femur with interval placement of fixation hardware with improved alignment. Electronically Signed   By: Brett Fairy M.D.   On: 05/26/2022 21:47   DG Knee  Left Port  Result Date: 05/26/2022 CLINICAL DATA:  Postoperative left knee fracture. EXAM: PORTABLE LEFT KNEE - 1-2 VIEW COMPARISON:  Intraoperative fluoroscopy 05/26/2022 FINDINGS: Postoperative changes with lateral plate and screw fixation of the distal femoral shaft and metaphysis. Linear fracture of the metaphyseal region. Bones appear in near  anatomic alignment and position. Surgical hardware appears in place. Soft tissue gas is consistent with recent surgery. Prominent vascular calcifications. IMPRESSION: Postoperative plate and screw fixation of fractures of the left distal femoral metaphysis with near anatomic position demonstrated. Electronically Signed   By: Lucienne Capers M.D.   On: 05/26/2022 21:46   DG FEMUR MIN 2 VIEWS LEFT  Result Date: 05/26/2022 CLINICAL DATA:  Femur fracture EXAM: LEFT FEMUR 2 VIEWS COMPARISON:  05/24/2022, CT 05/26/2022 FINDINGS: Six low resolution intraoperative spot views of the left femur. Total fluoroscopy time was 38 seconds, fluoroscopic dose of 1.26 mGy. The images demonstrate surgical plate and multiple screw fixation of comminuted distal femoral fracture. IMPRESSION: Intraoperative fluoroscopic assistance provided during surgical fixation of distal left femoral fracture Electronically Signed   By: Donavan Foil M.D.   On: 05/26/2022 20:33   DG FEMUR, MIN 2 VIEWS RIGHT  Result Date: 05/26/2022 CLINICAL DATA:  Femur surgery EXAM: RIGHT FEMUR 2 VIEWS COMPARISON:  05/25/2022 FINDINGS: Six low resolution intraoperative spot views of the right femur. Total fluoroscopy time was 67 seconds, total fluoroscopic dose 2 mGy. Previous right hip replacement. Placement of surgical plate and multiple fixating screws across comminuted distal femoral fracture. Vascular calcifications. IMPRESSION: Intraoperative fluoroscopic assistance provided during surgical fixation of distal femoral fracture Electronically Signed   By: Donavan Foil M.D.   On: 05/26/2022 20:31   DG C-Arm 1-60 Min-No Report  Result Date: 05/26/2022 Fluoroscopy was utilized by the requesting physician.  No radiographic interpretation.   DG C-Arm 1-60 Min-No Report  Result Date: 05/26/2022 Fluoroscopy was utilized by the requesting physician.  No radiographic interpretation.   DG C-Arm 1-60 Min-No Report  Result Date:  05/26/2022 Fluoroscopy was utilized by the requesting physician.  No radiographic interpretation.   CT KNEE LEFT WO CONTRAST  Result Date: 05/26/2022 CLINICAL DATA:  Knee trauma, occult fracture suspected, xray done EXAM: CT OF THE LEFT KNEE WITHOUT CONTRAST TECHNIQUE: Multidetector CT imaging of the left knee was performed according to the standard protocol. Multiplanar CT image reconstructions were also generated. RADIATION DOSE REDUCTION: This exam was performed according to the departmental dose-optimization program which includes automated exposure control, adjustment of the mA and/or kV according to patient size and/or use of iterative reconstruction technique. COMPARISON:  Radiograph 05/25/2022. FINDINGS: Bones/Joint/Cartilage There is a posterior angulated, comminuted distal frontal metaphyseal fracture with a transverse component and a longitudinal component extending to the upper articular surface of the lateral femoral condyle. 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 No evidence of proximal tibia or fibula fracture. No patellar fracture. Small joint effusion. Ligaments Suboptimally assessed by CT. Muscles and Tendons No acute myotendinous abnormality. Soft tissues Soft tissue swelling along the knee.  No focal fluid collection. IMPRESSION: Comminuted distal femur fracture with posterior angulation and intra-articular extension through the upper aspect of the lateral femoral condyle. Electronically Signed   By: Maurine Simmering M.D.   On: 05/26/2022 14:23   DG FEMUR PORT, MIN 2 VIEWS RIGHT  Result Date: 05/25/2022 CLINICAL DATA:  Preop planning. Right distal femur fracture. Post reduction images. EXAM: RIGHT FEMUR PORTABLE 2 VIEW COMPARISON:  Earlier same day radiograph at 1226 hours FINDINGS: Overlying splint material partially obscures  fine osseous detail. Diffuse osteopenia. Unchanged position of the right distal femoral fracture fragments compared to earlier same day radiograph.  Persistent suprapatellar joint effusion. Right hip prosthesis is intact and in appropriate position. No definite periprosthetic fracture. Unchanged osteolysis at the acetabular cup as seen on recent CT. IMPRESSION: Unchanged position of the right distal femoral fracture fragments compared to earlier same day radiograph. Electronically Signed   By: Ileana Roup M.D.   On: 05/25/2022 14:56   DG Knee 2 Views Right  Result Date: 05/25/2022 CLINICAL DATA:  Postreduction EXAM: RIGHT KNEE - 1-2 VIEW COMPARISON:  Knee radiographs 1 day prior. FINDINGS: Again seen is a comminuted fracture of the distal femur with posterior angulation of the distal fragment. Alignment in the frontal projection is similar to the prior study with slight medial apex angulation. Alignment is improved in the lateral projection compared to the study from 1 day prior. Femoroacetabular alignment appears maintained. There is surrounding soft tissue swelling with a suprapatellar effusion. IMPRESSION: Improved alignment of the comminuted distal femoral fracture following reduction. Electronically Signed   By: Valetta Mole M.D.   On: 05/25/2022 12:37   DG Femur Min 2 Views Left  Result Date: 05/24/2022 CLINICAL DATA:  Left femur pain. Fall. EXAM: LEFT FEMUR 2 VIEWS COMPARISON:  None Available. FINDINGS: The bones are diffusely under mineralized. There is no evidence of fracture or other focal bone lesions. Degenerative change of the hip and knee. There are prominent vascular calcifications. Soft tissues are atrophic. IMPRESSION: 1. No fracture of the left femur. 2. Osteopenia/osteoporosis with degenerative change of the hip and knee. Electronically Signed   By: Keith Rake M.D.   On: 05/24/2022 20:19   DG Knee 1-2 Views Right  Result Date: 05/24/2022 CLINICAL DATA:  Fall.  Deformity. EXAM: RIGHT KNEE - 1-2 VIEW COMPARISON:  Right knee radiographs 05/19/2018 FINDINGS: There is diffuse decreased bone mineralization. There is horizontal  linear lucency, mild cortical step-off, and comminution of the mediolateral cortices of the distal femoral metaphysis on frontal view. There is moderate approximate 27 degree medial apex angulation of this fracture on frontal view. On lateral view there is high-grade approximately 90% angulation of the distal femoral metaphysis, with the distal fracture component angulated in posterior/flexion positioning compared to the proximal fracture component. No definite acute fracture is seen within the visualized proximal tibia or fibula. Tiny joint effusion. Mild chronic enthesopathic change at the quadriceps insertion on the patella. Moderate to high-grade vascular calcifications. IMPRESSION: Acute, comminuted fracture of the distal femoral metaphysis with moderate medial apex angulation and high-grade approximately 90% posterior angulation of the distal fracture component. Electronically Signed   By: Yvonne Kendall M.D.   On: 05/24/2022 11:58     Subjective: No complaints  Discharge Exam: Vitals:   06/02/22 0525 06/02/22 0728  BP: 138/64 (!) 140/65  Pulse: 72 72  Resp: 16 16  Temp:  98.2 F (36.8 C)  SpO2: 100% 99%   Vitals:   06/01/22 1459 06/01/22 2014 06/02/22 0525 06/02/22 0728  BP: (!) 146/54 (!) 139/53 138/64 (!) 140/65  Pulse: 70 69 72 72  Resp: '17 15 16 16  ' Temp: 97.9 F (36.6 C) 98.2 F (36.8 C)  98.2 F (36.8 C)  TempSrc: Oral   Oral  SpO2: 100% 100% 100% 99%  Weight:      Height:        General: Pt is alert, awake, not in acute distress Cardiovascular: RRR, S1/S2 +, no rubs, no gallops Respiratory: CTA bilaterally,  no wheezing, no rhonchi Abdominal: Soft, NT, ND, bowel sounds + Extremities: no edema, no cyanosis    The results of significant diagnostics from this hospitalization (including imaging, microbiology, ancillary and laboratory) are listed below for reference.     Microbiology: Recent Results (from the past 240 hour(s))  Surgical PCR screen     Status: None    Collection Time: 05/26/22 11:14 AM   Specimen: Nasal Mucosa; Nasal Swab  Result Value Ref Range Status   MRSA, PCR NEGATIVE NEGATIVE Final   Staphylococcus aureus NEGATIVE NEGATIVE Final    Comment: (NOTE) The Xpert SA Assay (FDA approved for NASAL specimens in patients 52 years of age and older), is one component of a comprehensive surveillance program. It is not intended to diagnose infection nor to guide or monitor treatment. Performed at [facility], [place] [address] [place], [place] [address]      Labs: BNP (last 3 results) No results for input(s): "BNP" in the last 8760 hours. Basic Metabolic Panel: Recent Labs  Lab 05/28/22 0245 05/29/22 0512 05/30/22 0250 05/31/22 0255 06/01/22 0325 06/01/22 1449  NA 138 133* 136 136 140 139  K 4.2 4.4 4.6 5.8* 6.3* 5.3*  CL 108 105 107 109 107 107  CO2 20* 17* 19* 20* 21* 22  GLUCOSE 112* 199* 161* 130* 133* 194*  BUN 50* 63* 68* 64* 62* 57*  CREATININE 2.08* 2.14* 1.82* 1.46* 1.58* 1.49*  CALCIUM 8.1* 8.0* 8.0* 8.5* 9.0 8.5*  MG 1.7 1.8 1.9  --   --   --   PHOS 3.1 3.3  --   --   --   --    Liver Function Tests: Recent Labs  Lab 05/28/22 0245 05/29/22 0512 05/30/22 0250  AST 23 37 36  ALT '6 7 6  ' ALKPHOS 83 106 126  BILITOT 1.5* 1.4* 1.4*  PROT 6.7 6.6 6.6  ALBUMIN 2.2* 2.1* 2.0*   No results for input(s): "LIPASE", "AMYLASE" in the last 168 hours. No results for input(s): "AMMONIA" in the last 168 hours. CBC: Recent Labs  Lab 05/28/22 0245 05/29/22 0512 05/30/22 0250 05/31/22 0255 06/01/22 0325  WBC 21.2* 15.9* 12.9* 9.6 8.6  HGB 8.5* 7.5* 8.9* 10.8* 9.2*  HCT 25.3* 22.9* 26.5* 33.1* 28.3*  MCV 81.9 83.6 81.3 83.8 83.7  PLT 43* 42* 47* 44* 48*   Cardiac Enzymes: No results for input(s): "CKTOTAL", "CKMB", "CKMBINDEX", "TROPONINI" in the last 168 hours. BNP: Invalid input(s): "POCBNP" CBG: Recent Labs  Lab 06/01/22 0732 06/01/22 1122 06/01/22 1609 06/01/22 2059 06/02/22 0726  GLUCAP 113*  159* 185* 119* 111*   D-Dimer No results for input(s): "DDIMER" in the last 72 hours. Hgb A1c No results for input(s): "HGBA1C" in the last 72 hours. Lipid Profile No results for input(s): "CHOL", "HDL", "LDLCALC", "TRIG", "CHOLHDL", "LDLDIRECT" in the last 72 hours. Thyroid function studies No results for input(s): "TSH", "T4TOTAL", "T3FREE", "THYROIDAB" in the last 72 hours.  Invalid input(s): "FREET3" Anemia work up No results for input(s): "VITAMINB12", "FOLATE", "FERRITIN", "TIBC", "IRON", "RETICCTPCT" in the last 72 hours. Urinalysis    Component Value Date/Time   COLORURINE YELLOW (A) 04/16/2022 2247   APPEARANCEUR CLOUDY (A) 04/16/2022 2247   LABSPEC 1.015 04/16/2022 2247   PHURINE 5.5 04/16/2022 2247   GLUCOSEU NEGATIVE 04/16/2022 2247   HGBUR TRACE (A) 04/16/2022 2247   BILIRUBINUR NEGATIVE 04/16/2022 2247   KETONESUR NEGATIVE 04/16/2022 2247   PROTEINUR NEGATIVE 04/16/2022 2247   UROBILINOGEN 0.2 05/28/2013 1328   NITRITE NEGATIVE  04/16/2022 2247   LEUKOCYTESUR NEGATIVE 04/16/2022 2247   Sepsis Labs Recent Labs  Lab 05/29/22 0512 05/30/22 0250 05/31/22 0255 06/01/22 0325  WBC 15.9* 12.9* 9.6 8.6   Microbiology Recent Results (from the past 240 hour(s))  Surgical PCR screen     Status: None   Collection Time: 05/26/22 11:14 AM   Specimen: Nasal Mucosa; Nasal Swab  Result Value Ref Range Status   MRSA, PCR NEGATIVE NEGATIVE Final   Staphylococcus aureus NEGATIVE NEGATIVE Final    Comment: (NOTE) The Xpert SA Assay (FDA approved for NASAL specimens in patients 18 years of age and older), is one component of a comprehensive surveillance program. It is not intended to diagnose infection nor to guide or monitor treatment. Performed at [facility], [place] [address], [place], [place] [address]      Time coordinating discharge: Over 30 minutes  SIGNED:   Charlynne Cousins, MD  [facility] 06/02/2022, 10:28 AM Pager   If 7PM-7AM,  please contact night-coverage [email or web address] Password [email or web address]

## 2022-06-03 LAB — GLUCOSE, CAPILLARY
Glucose-Capillary: 90 mg/dL (ref 70–99)
Glucose-Capillary: 93 mg/dL (ref 70–99)
Glucose-Capillary: 99 mg/dL (ref 70–99)
Glucose-Capillary: 106 mg/dL — ABNORMAL HIGH (ref 70–99)

## 2022-06-03 LAB — COMPREHENSIVE METABOLIC PANEL
ALT: 8 U/L (ref 0–44)
AST: 23 U/L (ref 15–41)
Albumin: 2.2 g/dL — ABNORMAL LOW (ref 3.5–5.0)
Alkaline Phosphatase: 100 U/L (ref 38–126)
Anion gap: 8 (ref 5–15)
BUN: 47 mg/dL — ABNORMAL HIGH (ref 8–23)
CO2: 22 mmol/L (ref 22–32)
Calcium: 8.4 mg/dL — ABNORMAL LOW (ref 8.9–10.3)
Chloride: 112 mmol/L — ABNORMAL HIGH (ref 98–111)
Creatinine, Ser: 1.2 mg/dL — ABNORMAL HIGH (ref 0.44–1.00)
GFR, Estimated: 42 mL/min — ABNORMAL LOW (ref >60)
Glucose, Bld: 102 mg/dL — ABNORMAL HIGH (ref 70–99)
Potassium: 5.2 mmol/L — ABNORMAL HIGH (ref 3.5–5.1)
Sodium: 142 mmol/L (ref 135–145)
Total Bilirubin: 1.2 mg/dL (ref 0.3–1.2)
Total Protein: 6.8 g/dL (ref 6.5–8.1)

## 2022-06-03 LAB — CBC WITH DIFFERENTIAL/PLATELET
Abs Immature Granulocytes: 0.13 10*3/uL — ABNORMAL HIGH (ref 0.00–0.07)
Basophils Absolute: 0 10*3/uL (ref 0.0–0.1)
Basophils Relative: 1 %
Eosinophils Absolute: 0 10*3/uL (ref 0.0–0.5)
Eosinophils Relative: 0 %
HCT: 31.6 % — ABNORMAL LOW (ref 36.0–46.0)
Hemoglobin: 10.1 g/dL — ABNORMAL LOW (ref 12.0–15.0)
Immature Granulocytes: 4 %
Lymphocytes Relative: 28 %
Lymphs Abs: 1 10*3/uL (ref 0.7–4.0)
MCH: 27.3 pg (ref 26.0–34.0)
MCHC: 32 g/dL (ref 30.0–36.0)
MCV: 85.4 fL (ref 80.0–100.0)
Monocytes Absolute: 0.9 10*3/uL (ref 0.1–1.0)
Monocytes Relative: 25 %
Neutro Abs: 1.5 10*3/uL — ABNORMAL LOW (ref 1.7–7.7)
Neutrophils Relative %: 42 %
Platelets: 63 10*3/uL — ABNORMAL LOW (ref 150–400)
RBC: 3.7 MIL/uL — ABNORMAL LOW (ref 3.87–5.11)
RDW: 19.6 % — ABNORMAL HIGH (ref 11.5–15.5)
WBC: 3.6 10*3/uL — ABNORMAL LOW (ref 4.0–10.5)
nRBC: 0 % (ref 0.0–0.2)

## 2022-06-03 LAB — MAGNESIUM: Magnesium: 1.9 mg/dL (ref 1.7–2.4)

## 2022-06-03 MED ORDER — SODIUM ZIRCONIUM CYCLOSILICATE 5 G PO PACK
5.0000 g | PACK | Freq: Once | ORAL | Status: AC
  Administered 2022-06-03: 5 g via ORAL
  Filled 2022-06-03: qty 1

## 2022-06-03 NOTE — Progress Notes (Signed)
Had a quite night and appears well settled in on unit. Denies pain. CBG monitored. Remains alert and oriented. Moderate to Maximum assist with ADL's. Able to express needs. Positional changes for comfort.Safety maintained.

## 2022-06-03 NOTE — Progress Notes (Addendum)
Patient ID: Gabriela [name], female   DOB: 06-Sep-1925, 88 y.o.   MRN: [id number]  Sw made attempt to reach out to patient granddaughter, [name]. Left detailed VM.

## 2022-06-03 NOTE — Evaluation (Signed)
Occupational Therapy Assessment and Plan  Patient Details  Name: Gabriela [name] MRN: [id number] Date of Birth: Apr 04, 1926  OT Diagnosis: acute pain, cognitive deficits, and muscle weakness (generalized) Rehab Potential: Rehab Potential (ACUTE ONLY): Fair ELOS: 14-18 days   Today's Date: 06/03/2022 OT Individual Time: 0905-1000  & 1415-1500 OT Individual Time Calculation (min): 55 min & 45 min OT missed time: 30 min Missed time reason: pain/fatigue   Hospital Problem: Principal Problem:   Oth fracture of unsp femur, init encntr for closed fracture [facility])   Past Medical History:  Past Medical History:  Diagnosis Date   Arthritis    Diabetes mellitus ([place])    Glaucoma    Hyperlipidemia    Hypertension    Shortness of breath    with exertion   Past Surgical History:  Past Surgical History:  Procedure Laterality Date   CARDIAC CATHETERIZATION  04/22/13   CHOLECYSTECTOMY     CORONARY ARTERY BYPASS GRAFT N/A 05/31/2013   Procedure: Coronary Artery Bypass Grafting Times Three Using Left Internal Mammary Artery and Right Saphenous Leg Vein Harvested Endoscopically;  Surgeon: Ivin Poot, MD;  Location: [place];  Service: Open Heart Surgery;  Laterality: N/A;   ERCP N/A 10/20/2020   Procedure: ENDOSCOPIC RETROGRADE CHOLANGIOPANCREATOGRAPHY (ERCP);  Surgeon: Lucilla Lame, MD;  Location: [facility] ENDOSCOPY;  Service: Endoscopy;  Laterality: N/A;   ERCP N/A 12/22/2020   Procedure: ENDOSCOPIC RETROGRADE CHOLANGIOPANCREATOGRAPHY (ERCP);  Surgeon: Lucilla Lame, MD;  Location: [facility] ENDOSCOPY;  Service: Endoscopy;  Laterality: N/A;   EYE SURGERY     HIP SURGERY     right   INTRAOPERATIVE TRANSESOPHAGEAL ECHOCARDIOGRAM N/A 05/31/2013   Procedure: INTRAOPERATIVE TRANSESOPHAGEAL ECHOCARDIOGRAM;  Surgeon: Ivin Poot, MD;  Location: [place];  Service: Open Heart Surgery;  Laterality: N/A;   JOINT REPLACEMENT     MITRAL VALVE REPAIR N/A 05/31/2013   Procedure: MITRAL VALVE REPAIR (MVR);   Surgeon: Ivin Poot, MD;  Location: [place];  Service: Open Heart Surgery;  Laterality: N/A;   ORIF FEMUR FRACTURE Bilateral 05/26/2022   Procedure: OPEN REDUCTION INTERNAL FIXATION (ORIF) DISTAL FEMUR FRACTURE;  Surgeon: Altamese Stoy, MD;  Location: [place];  Service: Orthopedics;  Laterality: Bilateral;   TONSILLECTOMY      Assessment & Plan Clinical Impression:  Gabriela [name] is a 88 year old female with history of T2DM with nephropathy (has decline nephrology follow up), HTN, MGUS (Dr. Talbert Cage), chronic RLE wound with recent admission for metabolic encephalopathy and pseudomonas wound infection treated with cipro X 6 weeks per 09/01 admission.  She was discharged to home on 09/07 with [facility] therapy and was making good progress. She was walking with PT without gait belt when her legs gave out, she fell to the floor with onset of LLE >RLE pain and inability to walk. She was found to have right distal femur Fx and left comminuted distal femur fracture with angulation and intra-articular extension into later femoral condyle. She underwent ORIF right and left supracondylar Fractures by Dr. Marcelino Scot on 05/26/22. Post op to be WBAT and continue low dose ASA. Sutures to stay in place 2 weeks with follow up X rays at that time.    She had rise in WBC to 21.2 and Dr. Drucilla Schmidt consulted for input on chronic right thigh wound and concerns of it seeding right THR prosthesis. CT right hip done revealing post surgical changes with chronic osteolysis around acetabular cup and chronic fluid collections contained in right iliopsoas bursa 10 X 4.2 X 5.0  cm (minimally increased c/w 09/01 admission).  As thigh wound almost closed with minimal drainage, decision made not to aspirate hip, monitor off antibioticus and watchful waiting as safer option. Leucocytosis has resolved, she has been afebrile and pain control improving. PT/OT has been working with patient who continues to be limited by pain, weakness and fatigue. CIR  recommended due to functional decline. Patient transferred to CIR on 06/02/2022 .    Patient currently requires total with basic self-care skills secondary to muscle weakness, decreased cardiorespiratoy endurance, decreased coordination, decreased problem solving and decreased memory, and decreased standing balance and decreased balance strategies.  Prior to hospitalization, patient could complete all self-care with mod I.  Patient will benefit from skilled intervention to decrease level of assist with basic self-care skills and increase independence with basic self-care skills prior to discharge home with care partner.  Anticipate patient will require 24 hour supervision and follow up home health.  OT - End of Session Activity Tolerance: Tolerates < 10 min activity, no significant change in vital signs Endurance Deficit: Yes Endurance Deficit Description: pain limiting OT Assessment Rehab Potential (ACUTE ONLY): Fair OT Barriers to Discharge: Home environment access/layout;Weight bearing restrictions;Incontinence OT Patient demonstrates impairments in the following area(s): Balance;Cognition;Endurance;Motor;Pain OT Basic ADL's Functional Problem(s): Grooming;Bathing;Dressing;Toileting OT Transfers Functional Problem(s): Toilet;Tub/Shower OT Additional Impairment(s): None OT Plan OT Intensity: Minimum of 1-2 x/day, 45 to 90 minutes OT Frequency: 5 out of 7 days OT Duration/Estimated Length of Stay: 14-18 days OT Treatment/Interventions: Balance/vestibular training;Discharge planning;Pain management;Self Care/advanced ADL retraining;Therapeutic Activities;UE/LE Coordination activities;Cognitive remediation/compensation;Disease mangement/prevention;Functional mobility training;Patient/family education;Skin care/wound managment;Therapeutic Exercise;DME/adaptive equipment instruction;Psychosocial support;UE/LE Strength taining/ROM;Wheelchair propulsion/positioning OT Self Feeding Anticipated  Outcome(s): Set up A OT Basic Self-Care Anticipated Outcome(s): Supervision-CGA OT Toileting Anticipated Outcome(s): CGA OT Bathroom Transfers Anticipated Outcome(s): CGA OT Recommendation Recommendations for Other Services: Neuropsych consult;Therapeutic Recreation consult Therapeutic Recreation Interventions: Kitchen group;Stress management Patient destination: Home Follow Up Recommendations: 24 hour supervision/assistance;Home health OT Equipment Recommended: To be determined   OT Evaluation Precautions/Restrictions  Precautions Precautions: Fall Precaution Comments: HOH, glaucoma Restrictions Weight Bearing Restrictions: Yes RLE Weight Bearing: Weight bearing as tolerated LLE Weight Bearing: Weight bearing as tolerated Other Position/Activity Restrictions: Transfers only Home Living/Prior Functioning Home Living Family/patient expects to be discharged to:: Private residence Living Arrangements: Alone Available Help at Discharge: Family, Available PRN/intermittently, Available 24 hours/day (granddaughter who lives next door, son) Type of Home: House Home Access: Level entry Home Layout: One level Bathroom Shower/Tub: Tub/shower unit (grab bars, handicap accesible chair and shower chair available) Bathroom Toilet: Handicapped height Bathroom Accessibility: Yes Additional Comments: Had caregiver who comes in 7 days a week to do IADLs (cooking, cleaning, laundry), however both available 24/7 if needed, with plan to renovate bathroom for accessibility, has ramp inside the house to access bathroom  Lives With: Alone IADL History Homemaking Responsibilities: Yes Meal Prep Responsibility: Primary Current License: No Occupation: Retired Leisure and Hobbies: Medical sales representative, folding clothes, housemaking Prior Function Level of Independence: [place] for independence, Needs assistance with homemaking, Independent with basic ADLs Meal Prep: Moderate Laundry: Total Light  Housekeeping: Total  Able to Take Stairs?: Yes Driving: No Vision Baseline Vision/History: 1 Wears glasses Ability to See in Adequate Light: 0 Adequate Patient Visual Report: No change from baseline Vision Assessment?: No apparent visual deficits Perception  Perception: Within Functional Limits Praxis Praxis: Intact Cognition Cognition Overall Cognitive Status: Within Functional Limits for tasks assessed Arousal/Alertness: Awake/alert Orientation Level: Person;Place;Situation Person: Oriented Place: Oriented Situation: Oriented Memory: Impaired Awareness: Appears intact Problem  Solving: Impaired Safety/Judgment: Appears intact Brief Interview for Mental Status (BIMS) Repetition of Three Words (First Attempt): 3 Temporal Orientation: Year: Missed by more than 5 years (2003) Temporal Orientation: Month: Accurate within 5 days Temporal Orientation: Day: Incorrect Recall: "Sock": No, could not recall Recall: "Blue": Yes, after cueing ("a color") Recall: "Bed": Yes, after cueing ("a piece of furniture") BIMS Summary Score: 7 Sensation Sensation Light Touch: Appears Intact Hot/Cold: Not tested Proprioception: Appears Intact Stereognosis: Not tested Additional Comments: reports numbness in L hand digits per prior carpal tunnel, however light touch intact Coordination Gross Motor Movements are Fluid and Coordinated: No Fine Motor Movements are Fluid and Coordinated: No Coordination and Movement Description: generalized uncoordination due to pain, difficulty WB and deconditioning Finger Nose Finger Test: slow and dysmetric Motor  Motor Motor: Within Functional Limits Motor - Skilled Clinical Observations: weakness, pain limiting, impaired balance strategies  Trunk/Postural Assessment  Cervical Assessment Cervical Assessment: Within Functional Limits Thoracic Assessment Thoracic Assessment: Exceptions to [facility] (rounded shoulders) Lumbar Assessment Lumbar Assessment:  Exceptions to [facility] (posterior pelvic tilt in sitting) Postural Control Postural Control: Within Functional Limits  Balance Balance Balance Assessed: Yes Static Sitting Balance Static Sitting - Balance Support: Feet supported;No upper extremity supported Static Sitting - Level of Assistance: 5: Stand by assistance Dynamic Sitting Balance Dynamic Sitting - Balance Support: Feet supported;No upper extremity supported Dynamic Sitting - Level of Assistance: 5: Stand by assistance (CGA) Extremity/Trunk Assessment RUE Assessment RUE Assessment: Exceptions to [facility] Passive Range of Motion (PROM) Comments: Limited to 120 degrees shoulder flexion Active Range of Motion (AROM) Comments: Limted to 100 degrees shoulder flexion General Strength Comments: Grossly 3-/5, 4-/5 distally, grasp WFL LUE Assessment LUE Assessment: Exceptions to [facility] Passive Range of Motion (PROM) Comments: Limited to 120 degrees shoulder flexion Active Range of Motion (AROM) Comments: Limited to 100 degrees shoulder flexion General Strength Comments: Grossly 3-/5, 4-/5 distally, grasp [facility]  Care Tool Care Tool Self Care Eating        Oral Care  Oral care, brush teeth, clean dentures activity did not occur: Refused (no teeth)      Bathing   Body parts bathed by patient: Right arm;Left arm;Chest;Abdomen;Right upper leg;Left upper leg;Face Body parts bathed by helper: Front perineal area;Buttocks;Right lower leg;Left lower leg   Assist Level: Maximal Assistance - Patient 24 - 49%    Upper Body Dressing(including orthotics)   What is the patient wearing?: Pull over shirt   Assist Level: Moderate Assistance - Patient 50 - 74%    Lower Body Dressing (excluding footwear)   What is the patient wearing?: Pants Assist for lower body dressing: Dependent - Patient 0%    Putting on/Taking off footwear   What is the patient wearing?: Non-skid slipper socks Assist for footwear: Dependent - Patient 0%       Care Tool  Toileting Toileting activity   Assist for toileting: Total Assistance - Patient < 25%     Care Tool Bed Mobility Roll left and right activity   Roll left and right assist level: Moderate Assistance - Patient 50 - 74%    Sit to lying activity   Sit to lying assist level: Maximal Assistance - Patient 25 - 49%    Lying to sitting on side of bed activity   Lying to sitting on side of bed assist level: the ability to move from lying on the back to sitting on the side of the bed with no back support.: Maximal Assistance - Patient 25 - 49%  Care Tool Transfers Sit to stand transfer Sit to stand activity did not occur: Safety/medical concerns (unable due to pain)      Chair/bed transfer         Toilet transfer Toilet transfer activity did not occur: Safety/medical concerns (unable due to pain)       Care Tool Cognition  Expression of Ideas and Wants Expression of Ideas and Wants: 3. Some difficulty - exhibits some difficulty with expressing needs and ideas (e.g, some words or finishing thoughts) or speech is not clear  Understanding Verbal and Non-Verbal Content Understanding Verbal and Non-Verbal Content: 3. Usually understands - understands most conversations, but misses some part/intent of message. Requires cues at times to understand   Memory/Recall Ability Memory/Recall Ability : Current season;That he or she is in a hospital/hospital unit   Refer to Care Plan for [place] 1 OT Short Term Goal 1 (Week 1): Pt will complete 1/3 toileting steps with CGA OT Short Term Goal 2 (Week 1): Pt will complete toilet transfer with Mod A in prep for OOB toileting OT Short Term Goal 3 (Week 1): Pt will use AE PRN to thread pants with supervision for balance OT Short Term Goal 4 (Week 1): Pt will sit > stand in prep for ADL with mod A using LRAD  Recommendations for other services: Neuropsych and Therapeutic Recreation  Kitchen group and Stress management    Skilled Therapeutic Intervention Session 1 Patient received upright in bed upon therapy arrival and agreeable to participate in OT evaluation. Granddaughter present to assist with obtaining PLOF history. Education provided on OT purpose, therapy schedule, goals for therapy, and safety policy while in rehab. No initial c/o pain, however upon bed mobility and transfers, pt reported significant increase in pain in R thigh > L thigh with no number given. Nursing made aware and therapist offered rest breaks and repositioning for pain reduction throughout. Pt received with purewick donned, therapist removed with total A, and provided education on functional toileting in IPR to promote independence with BADL in prep for discharge. Patient demonstrates fear of falling with mobility, BLE weakness/pain, memory deficits, generalized incoordination and weakness, resulting in difficulty completing BADL tasks without increased physical assist. Pt will benefit from skilled OT services to focus on mentioned deficits. See below for ADL and functional transfer performance. Pt remained upright in bed at conclusion of session with bed alarm on and all needs met at end of session.  Session 2 Skilled OT intervention completed with focus on activity tolerance, functional sit > stands, WB tolerance. Pt received upright in bed, asleep but easily woken, with son and DIL present. Pt indicated max fatigue from day of therapy, however with light encouragement was agreeable to engage in session until unable. Indicated 4/10 pain in bilateral femurs- nurse notified however did not arrive during duration of session. Offered rest breaks and repositioning for pain reduction throughout.   Of note- pt has baseline R foot drop, however presents with internally rotated BLE while in bed, with knee valgus at EOB/in stance. Notified MD of [facility] request for BLE for proper extremity alignment and positioning while in bed to prepare for standing and  functional gait.  Transitioned to R side of bed, with overall max A for managing BLE and pt assisting with trunk with heavy use of bed rails and therapist's hips for powering up. Able to scoot towards EOB with min A. Rest of session with focus on sit > stand in  stedy. Required x5 attempts to progress standing from being unable to clear hips from EOB > semi-upright stance with max A. Tolerated sitting in perched position for about 5 mins with supervision for sitting balance, while therapist talked pt through the purpose of stedy and demonstrated the transfer of her in the stedy to a w/c. Required cues for anterior weight shift, pulling up with grab bar, tucking pelvis under her for upright posture. Pt continues to present fearfulness with standing, however tolerates the mobility well with encouragement and verbal cues prior to engaging.  Pt indicated increased pain with perched position, with request to return to bed. Completed max A sit > stand, then mod A lowering to EOB. Total A bed mobility to L sidelying. Max cues needed for repositioning in bed with R<>L rolling and bridging for scooting towards HOB, though +2 needed for boosting due to pain.   Folded pillow placed in between thighs for promoting neutral hip and BLE alignment until PRAFOs delivered. Pt remained upright in bed with bed alarm on and all needs in reach at end of session. Pt missed 30 mins of OT intervention due to pain/fatigue. Will make up missed mins as time allows.   ADL ADL Eating: Not assessed Grooming: Not assessed Upper Body Bathing: Supervision/safety Where Assessed-Upper Body Bathing: Bed level Lower Body Bathing: Maximal assistance Where Assessed-Lower Body Bathing: Bed level Upper Body Dressing: Moderate assistance Where Assessed-Upper Body Dressing: Bed level Lower Body Dressing: Dependent Where Assessed-Lower Body Dressing: Bed level;Edge of bed Toileting: Dependent Where Assessed-Toileting: Bed level Toilet  Transfer: Unable to assess Toilet Transfer Method: Unable to assess Tub/Shower Transfer: Unable to assess Tub/Shower Transfer Method: Unable to assess Social research officer, government: Unable to assess Social research officer, government Method: Unable to assess Mobility  Bed Mobility Bed Mobility: Rolling Right;Rolling Left;Right Sidelying to Sit;Sitting - Scoot to [employer] of Bed;Scooting to [place] Right: Moderate Assistance - Patient 50-74% Rolling Left: Moderate Assistance - Patient 50-74% Right Sidelying to Sit: Maximal Assistance - Patient 25-49% Sitting - Scoot to Edge of Bed: Moderate Assistance - Patient 50-74% Scooting to [facility]: Total Assistance - Patient < 25%   Discharge Criteria: Patient will be discharged from OT if patient refuses treatment 3 consecutive times without medical reason, if treatment goals not met, if there is a change in medical status, if patient makes no progress towards goals or if patient is discharged from hospital.  The above assessment, treatment plan, treatment alternatives and goals were discussed and mutually agreed upon: by patient and by family  Blase Mess, [place], OTR/L  06/03/2022, 3:08 PM
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
## 2022-06-03 NOTE — Progress Notes (Signed)
[facility] Individual Statement of Services  Patient Name:  Gabriela [name]  Date:  06/03/2022  Welcome to the [facility].  Our goal is to provide you with an individualized program based on your diagnosis and situation, designed to meet your specific needs.  With this comprehensive rehabilitation program, you will be expected to participate in at least 3 hours of rehabilitation therapies Monday-Friday, with modified therapy programming on the weekends.  Your rehabilitation program will include the following services:  Physical Therapy (PT), Occupational Therapy (OT), Speech Therapy (ST), 24 hour per day rehabilitation nursing, Therapeutic Recreaction (TR), Neuropsychology, Care Coordinator, Rehabilitation Medicine, Nutrition Services, Pharmacy Services, and Other  Weekly team conferences will be held on Wednesdays to discuss your progress.  Your Inpatient Rehabilitation Care Coordinator will talk with you frequently to get your input and to update you on team discussions.  Team conferences with you and your family in attendance may also be held.  Expected length of stay:  18-21 Days  Overall anticipated outcome:  Min A  Depending on your progress and recovery, your program may change. Your Inpatient Rehabilitation Care Coordinator will coordinate services and will keep you informed of any changes. Your Inpatient Rehabilitation Care Coordinator's name and contact numbers are listed  below.  The following services may also be recommended but are not provided by the [facility]:   [place] will be made to provide these services after discharge if needed.  Arrangements include referral to agencies that provide these services.  Your insurance has been verified to be:  [place]  Your primary doctor is:  Simmons-Robinson, Riki Sheer, MD  Pertinent  information will be shared with your doctor and your insurance company.  Inpatient Rehabilitation Care Coordinator:  Erlene Quan, [place] or [phone]  Information discussed with and copy given to patient by: Dyanne Iha, 06/03/2022, 11:58 AM

## 2022-06-03 NOTE — Discharge Instructions (Addendum)
Inpatient Rehab Discharge Instructions  [place] Discharge date and time:    Activities/Precautions/ Functional Status: Activity: no lifting, driving, or strenuous exercise till cleared by MD Diet: low fat, low cholesterol diet Wound Care: keep wound clean and dry. Contact Dr. Marcelino Scot if you develop any problems with your incision/wound--redness, swelling, increase in pain, drainage or if you develop fever or chills.    2. Apply foam dressing to right heel for padding. Use Prevalon boot at nights for pressure relief.   Functional status:  ___ No restrictions     ___ Walk up steps independently _X__ 24/7 supervision/assistance   ___ Walk up steps with assistance ___ Intermittent supervision/assistance  ___ Bathe/dress independently ___ Walk with walker     _X__ Bathe/dress with assistance ___ Walk Independently    ___ Shower independently ___ Walk with assistance    ___ Shower with assistance _X__ No alcohol     ___ Return to work/school ________   Special Instructions: Protein supplements twice a day.    My questions have been answered and I understand these instructions. I will adhere to these goals and the provided educational materials after my discharge from the hospital.  Patient/Caregiver Signature _______________________________ Date __________  Clinician Signature _______________________________________ Date __________  Please bring this form and your medication list with you to all your follow-up doctor's appointments.

## 2022-06-03 NOTE — Progress Notes (Signed)
Inpatient Rehabilitation  Patient information reviewed and entered into eRehab system by Aldrin Engelhard M. Devaunte Gasparini, M.A., CCC/SLP, PPS Coordinator.  Information including medical coding, functional ability and quality indicators will be reviewed and updated through discharge.    

## 2022-06-03 NOTE — Evaluation (Signed)
Physical Therapy Assessment and Plan  Patient Details  Name: Gabriela [name] MRN: [id number] Date of Birth: 04/19/1926  PT Diagnosis: Abnormal posture, Abnormality of gait, Difficulty walking, Muscle weakness, and Pain in bilateral legs Rehab Potential: Good ELOS: 14-18 days.   Today's Date: 06/03/2022 PT Individual Time: 3729-0211 PT Individual Time Calculation (min): 72 min    Hospital Problem: Principal Problem:   Oth fracture of unsp femur, init encntr for closed fracture [facility])   Past Medical History:  Past Medical History:  Diagnosis Date   Arthritis    Diabetes mellitus ([place])    Glaucoma    Hyperlipidemia    Hypertension    Shortness of breath    with exertion   Past Surgical History:  Past Surgical History:  Procedure Laterality Date   CARDIAC CATHETERIZATION  04/22/13   CHOLECYSTECTOMY     CORONARY ARTERY BYPASS GRAFT N/A 05/31/2013   Procedure: Coronary Artery Bypass Grafting Times Three Using Left Internal Mammary Artery and Right Saphenous Leg Vein Harvested Endoscopically;  Surgeon: Ivin Poot, MD;  Location: [place];  Service: Open Heart Surgery;  Laterality: N/A;   ERCP N/A 10/20/2020   Procedure: ENDOSCOPIC RETROGRADE CHOLANGIOPANCREATOGRAPHY (ERCP);  Surgeon: Lucilla Lame, MD;  Location: [facility] ENDOSCOPY;  Service: Endoscopy;  Laterality: N/A;   ERCP N/A 12/22/2020   Procedure: ENDOSCOPIC RETROGRADE CHOLANGIOPANCREATOGRAPHY (ERCP);  Surgeon: Lucilla Lame, MD;  Location: [facility] ENDOSCOPY;  Service: Endoscopy;  Laterality: N/A;   EYE SURGERY     HIP SURGERY     right   INTRAOPERATIVE TRANSESOPHAGEAL ECHOCARDIOGRAM N/A 05/31/2013   Procedure: INTRAOPERATIVE TRANSESOPHAGEAL ECHOCARDIOGRAM;  Surgeon: Ivin Poot, MD;  Location: [place];  Service: Open Heart Surgery;  Laterality: N/A;   JOINT REPLACEMENT     MITRAL VALVE REPAIR N/A 05/31/2013   Procedure: MITRAL VALVE REPAIR (MVR);  Surgeon: Ivin Poot, MD;  Location: [place];  Service: Open Heart Surgery;   Laterality: N/A;   ORIF FEMUR FRACTURE Bilateral 05/26/2022   Procedure: OPEN REDUCTION INTERNAL FIXATION (ORIF) DISTAL FEMUR FRACTURE;  Surgeon: Altamese [place], MD;  Location: [place];  Service: Orthopedics;  Laterality: Bilateral;   TONSILLECTOMY      Assessment & Plan Clinical Impression: Gabriela [name] is a 88 year old female with history of T2DM with nephropathy (has decline nephrology follow up), HTN, MGUS (Dr. Talbert Cage), chronic RLE wound with recent admission for metabolic encephalopathy and pseudomonas wound infection treated with cipro X 6 weeks per 09/01 admission.  She was discharged to home on 09/07 with [facility] therapy and was making good progress. She was walking with PT without gait belt when her legs gave out, she fell to the floor with onset of LLE >RLE pain and inability to walk. She was found to have right distal femur Fx and left comminuted distal femur fracture with angulation and intra-articular extension into later femoral condyle. She underwent ORIF right and left supracondylar Fractures by Dr. Marcelino Scot on 05/26/22. Post op to be WBAT and continue low dose ASA. Sutures to stay in place 2 weeks with follow up X rays at that time.    She had rise in WBC to 21.2 and Dr. Drucilla Schmidt consulted for input on chronic right thigh wound and concerns of it seeding right THR prosthesis. CT right hip done revealing post surgical changes with chronic osteolysis around acetabular cup and chronic fluid collections contained in right iliopsoas bursa 10 X 4.2 X 5.0 cm (minimally increased c/w 09/01 admission).  As thigh wound almost closed with  minimal drainage, decision made not to aspirate hip, monitor off antibioticus and watchful waiting as safer option. Leucocytosis has resolved, she has been afebrile and pain control improving. PT/OT has been working with patient who continues to be limited by pain, weakness and fatigue. CIR recommended due to functional decline.   Patient currently requires total with  mobility secondary to muscle weakness and decreased standing balance.  Prior to hospitalization, patient was supervision with mobility and lived with Alone in a House home.  Home access is  Level entry.  Patient will benefit from skilled PT intervention to maximize safe functional mobility, minimize fall risk, and decrease caregiver burden for planned discharge home with 24 hour supervision.  Anticipate patient will benefit from follow up [place] at discharge.  PT - End of Session Activity Tolerance: Tolerates 10 - 20 min activity with multiple rests Endurance Deficit: Yes Endurance Deficit Description: pain limiting PT Assessment Rehab Potential (ACUTE/IP ONLY): Good PT Barriers to Discharge: Inaccessible home environment PT Patient demonstrates impairments in the following area(s): Balance;Safety;Endurance;Motor;Pain PT Transfers Functional Problem(s): Bed Mobility;Bed to Chair;Car;Furniture PT Locomotion Functional Problem(s): Ambulation;Wheelchair Mobility PT Plan PT Intensity: Minimum of 1-2 x/day ,45 to 90 minutes PT Frequency: 5 out of 7 days PT Duration Estimated Length of Stay: 14-18 days. PT Treatment/Interventions: Ambulation/gait training;Community reintegration;Neuromuscular re-education;UE/LE Strength taining/ROM;Wheelchair propulsion/positioning;Balance/vestibular training;Pain management;Therapeutic Activities;UE/LE Coordination activities;Functional mobility training;Patient/family education;Therapeutic Exercise PT Transfers Anticipated Outcome(s): supervision transfers PT Locomotion Anticipated Outcome(s): CGA/supervision gait. PT Recommendation Follow Up Recommendations: Home health PT Patient destination: Home Equipment Recommended: To be determined Equipment Details: probably will need w/c.   PT Evaluation Precautions/Restrictions Precautions Precautions: Fall Precaution Comments: HOH Restrictions Weight Bearing Restrictions: Yes RLE Weight Bearing: Weight bearing  as tolerated LLE Weight Bearing: Weight bearing as tolerated Other Position/Activity Restrictions: Transfers only General Chart Reviewed: Yes Family/Caregiver Present: Yes Vital Signs Pain Pain Assessment Pain Scale: 0-10 Pain Score: 5  Pain Type: Surgical pain Pain Location: Leg Pain Orientation: Right;Left Pain Descriptors / Indicators: Sharp Pain Onset: With Activity Pain Interference Pain Interference Pain Effect on Sleep: 3. Frequently Pain Interference with Therapy Activities: 3. Frequently Pain Interference with Day-to-Day Activities: 3. Frequently Home Living/Prior Functioning Home Living Available Help at Discharge: Family;Available PRN/intermittently;Available 24 hours/day Type of Home: House Home Access: Level entry Home Layout: One level Bathroom Shower/Tub: Tub/shower unit (grab bars, handicap accesible chair and shower chair available) Bathroom Toilet: Handicapped height Bathroom Accessibility: Yes Additional Comments: Had caregiver who comes in 7 days a week to do IADLs (cooking, cleaning, laundry), however both available 24/7 if needed, with plan to renovate bathroom for accessibility, has ramp inside the house to access bathroom  Lives With: Alone Prior Function Level of Independence: Needs assistance with gait;Needs assistance with tranfers;Requires assistive device for independence (since last hospitalization) Meal Prep: Moderate Laundry: Total Light Housekeeping: Total  Able to Take Stairs?: Yes Driving: No Vision/Perception  Vision - History Ability to See in Adequate Light: 0 Adequate Perception Perception: Within Functional Limits Praxis Praxis: Intact  Cognition Overall Cognitive Status: Within Functional Limits for tasks assessed Arousal/Alertness: Awake/alert Memory: Impaired Awareness: Appears intact Problem Solving: Impaired Safety/Judgment: Appears intact Sensation Sensation Light Touch: Appears Intact Hot/Cold: Not  tested Proprioception: Appears Intact Stereognosis: Not tested Additional Comments: reports numbness in L hand digits per prior carpal tunnel, however light touch intact Coordination Gross Motor Movements are Fluid and Coordinated: No Fine Motor Movements are Fluid and Coordinated: No Coordination and Movement Description: generalized uncoordination due to pain, difficulty WB and deconditioning Finger Nose  Finger Test: slow and dysmetric Heel Shin Test: unable to perform 2/2 pain. Motor  Motor Motor: Within Functional Limits Motor - Skilled Clinical Observations: weakness, pain limiting.   Trunk/Postural Assessment  Cervical Assessment Cervical Assessment: Within Functional Limits Thoracic Assessment Thoracic Assessment: Exceptions to [facility] Lumbar Assessment Lumbar Assessment: Exceptions to [facility] Postural Control Postural Control: Within Functional Limits  Balance Balance Balance Assessed: Yes Static Sitting Balance Static Sitting - Balance Support: Feet supported;No upper extremity supported Static Sitting - Level of Assistance: 5: Stand by assistance Dynamic Sitting Balance Dynamic Sitting - Balance Support: Feet supported;No upper extremity supported Dynamic Sitting - Level of Assistance: 5: Stand by assistance (CGA) Static Standing Balance Static Standing - Level of Assistance: 1: +1 Total assist Extremity Assessment  RUE Assessment RUE Assessment: Exceptions to [facility] Passive Range of Motion (PROM) Comments: Limited to 120 degrees shoulder flexion Active Range of Motion (AROM) Comments: Limted to 100 degrees shoulder flexion General Strength Comments: Grossly 3-/5, 4-/5 distally, grasp WFL LUE Assessment LUE Assessment: Exceptions to [facility] Passive Range of Motion (PROM) Comments: Limited to 120 degrees shoulder flexion Active Range of Motion (AROM) Comments: Limited to 100 degrees shoulder flexion General Strength Comments: Grossly 3-/5, 4-/5 distally, grasp WFL RLE  Assessment RLE Assessment: Exceptions to [facility] General Strength Comments: grossly 3-/5 knees, unable to assess hip RLE Strength RLE Overall Strength: Due to pain LLE Assessment LLE Assessment: Exceptions to [facility] General Strength Comments: 3-/5 knees, unable to assess hips 2/2 pain. LLE Strength LLE Overall Strength: Due to pain  Care Tool Care Tool Bed Mobility Roll left and right activity   Roll left and right assist level: Moderate Assistance - Patient 50 - 74%    Sit to lying activity   Sit to lying assist level: Maximal Assistance - Patient 25 - 49%    Lying to sitting on side of bed activity   Lying to sitting on side of bed assist level: the ability to move from lying on the back to sitting on the side of the bed with no back support.: Moderate Assistance - Patient 50 - 74%     Care Tool Transfers Sit to stand transfer Sit to stand activity did not occur: Safety/medical concerns (unable due to pain) Sit to stand assist level: Total Assistance - Patient < 25%    Chair/bed transfer   Chair/bed transfer assist level: Moderate Assistance - Patient 50 - 74% (slide board.)     Toilet transfer Toilet transfer activity did not occur: Safety/medical concerns (unable due to pain)      Car transfer Car transfer activity did not occur: Safety/medical concerns        Care Tool Locomotion Ambulation Ambulation activity did not occur: Safety/medical concerns        Walk 10 feet activity Walk 10 feet activity did not occur: Safety/medical concerns       Walk 50 feet with 2 turns activity Walk 50 feet with 2 turns activity did not occur: Safety/medical concerns      Walk 150 feet activity Walk 150 feet activity did not occur: Safety/medical concerns      Walk 10 feet on uneven surfaces activity Walk 10 feet on uneven surfaces activity did not occur: Safety/medical concerns      Stairs Stair activity did not occur: Safety/medical concerns        Walk up/down 1 step  activity Walk up/down 1 step or curb (drop down) activity did not occur: Safety/medical concerns      Walk up/down  4 steps activity Walk up/down 4 steps activity did not occur: Safety/medical concerns      Walk up/down 12 steps activity Walk up/down 12 steps activity did not occur: Safety/medical concerns      Pick up small objects from floor Pick up small object from the floor (from standing position) activity did not occur: Safety/medical concerns      Wheelchair Is the patient using a wheelchair?: Yes Type of Wheelchair: Manual   Wheelchair assist level: Minimal Assistance - Patient > 75% Max wheelchair distance: 80  Wheel 50 feet with 2 turns activity   Assist Level: Minimal Assistance - Patient > 75%  Wheel 150 feet activity   Assist Level: Moderate Assistance - Patient 50 - 74%    Refer to Care Plan for Long Term Goals  SHORT TERM GOAL WEEK 1 PT Short Term Goal 1 (Week 1): Pt will transfer to R w/ min A consistently. PT Short Term Goal 2 (Week 1): Pt will transfer sit to stand w/ mod A. PT Short Term Goal 3 (Week 1): Pt will transfer w/c <> bed w/ min A and SB. PT Short Term Goal 4 (Week 1): PT will assess gait.  Recommendations for other services: None   Skilled Therapeutic Intervention Evaluation completed (see details above and below) with education on PT POC and goals and individual treatment initiated with focus on  strength, endurance, transfers, pain management and progress gait.  Pt presents supine in bed and agreeable to PT eval.  Granddaughter present and answering question about home set-up as well as PLOF.  Pt states no pain w/o activity, but pain increased to 5/10 w/ activity.  Pt required mod A to bring LE s to EOB and then rolls to right w/ siderails and mod A.  Pt transfers to right at home and has been independent w/ that PTA.  Pt transfers sidelying to sit w/ mod A.  Pt has "Craftmatic-type" bed and siderail has been installed for use.  Pt sat EOB w/o UE  support.  Pt transfers sit to stand w/ elevated bed and max to Total A, although unable to step to w/c 2/2 pain.  Pt stands w/ flexed posture and unable to push w/ arms for upright posture.  Pt returned to sitting and required total A for placement of SB.  Pt required mod A for SB to left into w/c.  Pt wheeled self in hallways w/ B UE s and mod A to maintain straight-line propulsion, tending to veer left.  Pt negotiated 38' before fatigue.  Pt required Total A for sit to stand in [place].  Pt unable to transfer sit to stand from perch of [place] w/o max/total A + 2 then returned to sitting in w/c.  Pt wished to remain sitting in w/c.  Chair alarm on and all needs in reach.        Mobility Bed Mobility Bed Mobility: Rolling Right;Right Sidelying to Sit Rolling Right: Moderate Assistance - Patient 50-74% Rolling Left: Moderate Assistance - Patient 50-74% Right Sidelying to Sit: Moderate Assistance - Patient 50-74% Sitting - Scoot to Edge of Bed: Moderate Assistance - Patient 50-74% Scooting to [facility]: Total Assistance - Patient < 25% Transfers Transfers: Sit to [employer] via Geophysicist/field seismologist Sit to Stand: Total Assistance - Patient < 25% Transfer (Assistive device): Rolling walker Transfer via Lift Equipment: Probation officer Ambulation: No Gait Gait: No Stairs / Additional Locomotion Stairs: No Architect: Yes Wheelchair Assistance: Moderate Assistance - Patient 50 -  74% Wheelchair Propulsion: Both upper extremities Wheelchair Parts Management: Needs assistance Distance: 65'   Discharge Criteria: Patient will be discharged from PT if patient refuses treatment 3 consecutive times without medical reason, if treatment goals not met, if there is a change in medical status, if patient makes no progress towards goals or if patient is discharged from hospital.  The above assessment, treatment plan, treatment alternatives and goals were discussed and mutually  agreed upon: by patient  Ladoris Gene 06/03/2022, 12:51 PM

## 2022-06-03 NOTE — Progress Notes (Signed)
Inpatient Rehabilitation Care Coordinator Assessment and Plan Patient Details  Name: YANIAH THIEMANN MRN: [id number] Date of Birth: 1925/12/09  Today's Date: 06/03/2022  Hospital Problems: Principal Problem:   Oth fracture of unsp femur, init encntr for closed fracture [facility])  Past Medical History:  Past Medical History:  Diagnosis Date   Arthritis    Diabetes mellitus ([place])    Glaucoma    Hyperlipidemia    Hypertension    Shortness of breath    with exertion   Past Surgical History:  Past Surgical History:  Procedure Laterality Date   CARDIAC CATHETERIZATION  04/22/13   CHOLECYSTECTOMY     CORONARY ARTERY BYPASS GRAFT N/A 05/31/2013   Procedure: Coronary Artery Bypass Grafting Times Three Using Left Internal Mammary Artery and Right Saphenous Leg Vein Harvested Endoscopically;  Surgeon: Ivin Poot, MD;  Location: [place];  Service: Open Heart Surgery;  Laterality: N/A;   ERCP N/A 10/20/2020   Procedure: ENDOSCOPIC RETROGRADE CHOLANGIOPANCREATOGRAPHY (ERCP);  Surgeon: Lucilla Lame, MD;  Location: [facility] ENDOSCOPY;  Service: Endoscopy;  Laterality: N/A;   ERCP N/A 12/22/2020   Procedure: ENDOSCOPIC RETROGRADE CHOLANGIOPANCREATOGRAPHY (ERCP);  Surgeon: Lucilla Lame, MD;  Location: [facility] ENDOSCOPY;  Service: Endoscopy;  Laterality: N/A;   EYE SURGERY     HIP SURGERY     right   INTRAOPERATIVE TRANSESOPHAGEAL ECHOCARDIOGRAM N/A 05/31/2013   Procedure: INTRAOPERATIVE TRANSESOPHAGEAL ECHOCARDIOGRAM;  Surgeon: Ivin Poot, MD;  Location: [place];  Service: Open Heart Surgery;  Laterality: N/A;   JOINT REPLACEMENT     MITRAL VALVE REPAIR N/A 05/31/2013   Procedure: MITRAL VALVE REPAIR (MVR);  Surgeon: Ivin Poot, MD;  Location: [place];  Service: Open Heart Surgery;  Laterality: N/A;   ORIF FEMUR FRACTURE Bilateral 05/26/2022   Procedure: OPEN REDUCTION INTERNAL FIXATION (ORIF) DISTAL FEMUR FRACTURE;  Surgeon: Altamese Pershing, MD;  Location: [place];  Service: Orthopedics;  Laterality:  Bilateral;   TONSILLECTOMY     Social History:  reports that she has never smoked. She has never used smokeless tobacco. She reports that she does not drink alcohol and does not use drugs.  Family / Support Systems Children: Kennyth Lose (Son) Other Supports: Oncologist (Rozanna Boer daughter), Copywriter, advertising (Rozanna Boer mother) Anticipated Caregiver: 24/7 Ability/Limitations of Caregiver: Min A Caregiver Availability: 24/7 Family Dynamics: support from children and grandchildren  Social History Preferred language: English Religion: Cabin crew - How often do you need to have someone help you when you read instructions, pamphlets, or other written material from your doctor or pharmacy?: Sometimes   Abuse/Neglect Abuse/Neglect Assessment Can Be Completed: Yes Physical Abuse: Denies Verbal Abuse: Denies Sexual Abuse: Denies Exploitation of patient/patient's resources: Denies Self-Neglect: Denies  Patient response to: Social Isolation - How often do you feel lonely or isolated from those around you?: Never  Emotional Status Recent Psychosocial Issues: coping Psychiatric History: n/a Substance Abuse History: n/a  Patient / Family Perceptions, Expectations & Goals Pt/Family understanding of illness & functional limitations: yes Premorbid pt/family roles/activities: Independent and went out in community Anticipated changes in roles/activities/participation: family able to assist at d/c. Granddaughter primarily Pt/family expectations/goals: [place]: None Premorbid Home Care/DME Agencies: Other (Comment) Librarian, academic, Radiation protection practitioner, Systems analyst) Transportation available at discharge: family able to transport Is the patient able to respond to transportation needs?: Yes In the past 12 months, has lack of transportation kept you from medical appointments or from getting medications?: No In the past 12 months, has lack of transportation kept you from meetings,  work, or from getting things needed for daily living?: No  Discharge Planning Living Arrangements: Alone Support Systems: Children, Other relatives Type of Residence: Private residence Insurance Resources: Multimedia programmer (specify) ([employer] MEDICARE) Financial Resources: Family Support Financial Screen Referred: No Living Expenses: Own Money Management: Patient Does the patient have any problems obtaining your medications?: No Home Management: Independent Patient/Family Preliminary Plans: Granddaughter able to assist with cogntive tasks Care Coordinator Barriers to Discharge: Wound Care, Insurance for SNF coverage Care Coordinator Anticipated Follow Up Needs: HH/OP Expected length of stay: 18-21 Days  Clinical Impression SW met with patient, introduced self and explained role. Patient anticipates returning home with assistance from her granddaughter. Sw made attempt to reach out to, [facility] and left detailed VM. No additional questions or concerns, contact information left in room for family.   Dyanne Iha 06/03/2022, 12:57 PM

## 2022-06-04 LAB — GLUCOSE, CAPILLARY
Glucose-Capillary: 103 mg/dL — ABNORMAL HIGH (ref 70–99)
Glucose-Capillary: 107 mg/dL — ABNORMAL HIGH (ref 70–99)
Glucose-Capillary: 109 mg/dL — ABNORMAL HIGH (ref 70–99)
Glucose-Capillary: 104 mg/dL — ABNORMAL HIGH (ref 70–99)

## 2022-06-04 NOTE — Progress Notes (Signed)
Orthopedic Tech Progress Note Patient Details:  Gabriela [name] [facility] 1926/04/17 [id number]  Ortho Devices Type of Ortho Device: Prafo boot/shoe Ortho Device/Splint Location: Bi LE Ortho Device/Splint Interventions: Application   Post Interventions Patient Tolerated: Well  Gabriela [name] 06/04/2022, 10:58 AM

## 2022-06-04 NOTE — Progress Notes (Signed)
Physical Therapy Session Note  Patient Details  Name: Gabriela [name] MRN: [id number] Date of Birth: 06-Aug-1926  Today's Date: 06/04/2022 PT Individual Time: 4431-5400 PT Individual Time Calculation (min): 58 min   Short Term Goals: Week 1:  PT Short Term Goal 1 (Week 1): Pt will transfer to R w/ min A consistently. PT Short Term Goal 2 (Week 1): Pt will transfer sit to stand w/ mod A. PT Short Term Goal 3 (Week 1): Pt will transfer w/c <> bed w/ min A and SB. PT Short Term Goal 4 (Week 1): PT will assess gait.  Skilled Therapeutic Interventions/Progress Updates:  Pt was seen bedside in the am. Pt transported to rehab gym. Pt performed AAROM 3 sets x 10 reps each, hip flex and LAQs. Pt stood x 5 in parallel bars with max A and verbal cues. Pt transferred w/c to edge of bed with max A and verbal cues. Pt transferred edge of bed to supine with max A and verbal cues. Pt moved up in bed dependently. Pt left sitting up in bed with chair alarm on and all needs within reach.   Therapy Documentation Precautions:  Precautions Precautions: Fall Precaution Comments: HOH Restrictions Weight Bearing Restrictions: Yes RLE Weight Bearing: Weight bearing as tolerated LLE Weight Bearing: Weight bearing as tolerated Other Position/Activity Restrictions: Transfers only General:   Vital Signs:  Pain: Pt c/o 7/10 pain L knee pain, medicated prior to treatment.    Therapy/Group: Individual Therapy  Dub Amis 06/04/2022, 12:34 PM

## 2022-06-04 NOTE — Progress Notes (Signed)
Physical Therapy Session Note  Patient Details  Name: Gabriela [name] MRN: [id number] Date of Birth: [birth year]  Today's Date: 06/04/2022 PT Individual Time: 1302-1359 PT Individual Time Calculation (min): 57 min   Short Term Goals: Week 1:  PT Short Term Goal 1 (Week 1): Pt will transfer to R w/ min A consistently. PT Short Term Goal 2 (Week 1): Pt will transfer sit to stand w/ mod A. PT Short Term Goal 3 (Week 1): Pt will transfer w/c <> bed w/ min A and SB. PT Short Term Goal 4 (Week 1): PT will assess gait.  Skilled Therapeutic Interventions/Progress Updates:  PT was seen bedside in the pm. Pt sitting on edge of bed with CNA. Pt transferred edge of bed to supine with mod to max  A and siderail. Pt performed 3 sets x 10 reps each, LE exercises, heel slides, hip abd/add, SAQs, and quad sets. Pt transferred supine to edge of bed with mod A and verbal cues. Pt transferred edge of bed to supine with mod to max A and verbal cues. Pt rolled into R sidelying with c/g and verbal cues. Pt left sitting up in bed with all needs within reach and bed alarm on.   Therapy Documentation Precautions:  Precautions Precautions: Fall Precaution Comments: HOH Restrictions Weight Bearing Restrictions: Yes RLE Weight Bearing: Weight bearing as tolerated LLE Weight Bearing: Weight bearing as tolerated Other Position/Activity Restrictions: Transfers only General:    Pain: PT c/o 7/10 pain L LE worse than R LE.     Therapy/Group: Individual Therapy  Dub Amis 06/04/2022, 1:55 PM

## 2022-06-04 NOTE — Progress Notes (Signed)
Occupational Therapy Session Note  Patient Details  Name: Gabriela [name] MRN: [id number] Date of Birth: 01-Jul-1926  Today's Date: 06/04/2022 OT Individual Time: 9024-0973 OT Individual Time Calculation (min): 75 min    Short Term Goals: Week 1:  OT Short Term Goal 1 (Week 1): Pt will complete 1/3 toileting steps with CGA OT Short Term Goal 2 (Week 1): Pt will complete toilet transfer with Mod A in prep for OOB toileting OT Short Term Goal 3 (Week 1): Pt will use AE PRN to thread pants with supervision for balance OT Short Term Goal 4 (Week 1): Pt will sit > stand in prep for ADL with mod A using LRAD  Skilled Therapeutic Interventions/Progress Updates:    Upon OT arrival, pt resting in bed and is easily aroused by voice. Pt agreeable to OT treatment and reports 7/10 pain to L knee. Pt requesting to wash face and hands before eating her breakfast and completes with Setup. Pt completes supine to sit transfer with Mod A and sits EOB with Supervision and eat breakfast. Pt able to manage all containers and packets independently. Increased time required to eat and pt had no LOB. Pt completes scoot pivot transfer from bed to w/c with 2 attempts and Total A. Pt fearful of falling. Pt was transported infront of sink to perform sponge bath ADL at the levels below. Pt limited by decreased strength and fear of falling and requires education and encouragement to complete herself. Use of Stedy for transfers x 2 people. Pt was left in w/c at end of session with all needs met and safety measures in place.  Therapy Documentation Precautions:  Precautions Precautions: Fall Precaution Comments: HOH Restrictions Weight Bearing Restrictions: Yes RLE Weight Bearing: Weight bearing as tolerated LLE Weight Bearing: Weight bearing as tolerated Other Position/Activity Restrictions: Transfers only    ADL: ADL Eating: Independent Where Assessed-Eating: Edge of bed Grooming: Supervision/safety Where  Assessed-Grooming: Sitting at sink Upper Body Bathing: Supervision/safety Where Assessed-Upper Body Bathing: Sitting at sink Lower Body Bathing: Dependent (x2 assist using stedy to stand and wash buttocks) Where Assessed-Lower Body Bathing: Standing at sink, Other (Comment) (stedy) Upper Body Dressing: Minimal assistance (to pull over head) Where Assessed-Upper Body Dressing: Wheelchair Lower Body Dressing: Dependent (able to thread L LE into brief with reacher only. Total A for pants management and 2 people using stedy) Where Assessed-Lower Body Dressing: Standing at sink, Other (Comment) (stedy) Toileting: Dependent (purewick) Where Assessed-Toileting: Bed level    Therapy/Group: Individual Therapy  Londan Coplen 06/04/2022, 8:40 AM

## 2022-06-05 LAB — GLUCOSE, CAPILLARY: Glucose-Capillary: 76 mg/dL (ref 70–99)

## 2022-06-05 NOTE — Progress Notes (Signed)
Occupational Therapy Session Note  Patient Details  Name: Gabriela [name] MRN: [id number] Date of Birth: 08/09/1926  Today's Date: 06/05/2022 OT Individual Time: 1023-1100, 2:30-3-15 OT Individual Time Calculation (min): 37 min , 60mns   Short Term Goals: Week 1:  OT Short Term Goal 1 (Week 1): Pt will complete 1/3 toileting steps with CGA OT Short Term Goal 2 (Week 1): Pt will complete toilet transfer with Mod A in prep for OOB toileting OT Short Term Goal 3 (Week 1): Pt will use AE PRN to thread pants with supervision for balance OT Short Term Goal 4 (Week 1): Pt will sit > stand in prep for ADL with mod A using LRAD  Skilled Therapeutic Interventions/Progress Updates:  (1st) OT Session:    Patient in bed upon arrival with family present at the time of skilled OT.  Nursing also present at the time of therapy administering muscle relaxer.  The pt agreed to complete a BADL related task in bathing EOB.  The pt was able to tranfer from supine to EOB with the 2 person assist regarding managing BLE, The pt was able to remove her over head gown with SBA, she was able to wash her UB with s/u and she was able to wash her perineal region with s/u.  The pt was ModA for washing her bottom. The pt was able to apply deodorant with s/u and she was MinA for donning her gown.  The pt was able to return to bed LOF with 2 person assist for managing BLE.  The pt was MaxA for going up in the bed.  The bedside table and call light were within reach and the technician activated the bed alarm with the family present at the time of OT services.  The pt had no report of pain at the time of service.   (2nd) OT session:  The pt complete UB exercises at bed LOF using a towel to complete shld flexion, horizontal abduction, and shld rotation. The pt was able to retrieve squigz from the table top surface incorporating BUE to improve bilateral hand manipulation. The pt went on to complete bicep curls using a 2lb dumb bell 2  sets of 15 to improve UB strength to enhance functional independence with BADL related task. Patient worked on repositioning in bed to improve anatomical positioning of UB/LB with pillow position between BLE. The pt was able to wash her face and hands at the close of the treatment session with her bed side table and call light within reach and her bed alarm activated. All additional needs were dressed prior to exiting the room.  Therapy Documentation Precautions:  Precautions Precautions: Fall Precaution Comments: HOH Restrictions Weight Bearing Restrictions: Yes RLE Weight Bearing: Weight bearing as tolerated LLE Weight Bearing: Weight bearing as tolerated Other Position/Activity Restrictions: Transfers only General:   Vital Signs:  Pain:   ADL: ADL Eating: Independent Where Assessed-Eating: Edge of bed Grooming: Supervision/safety Where Assessed-Grooming: Sitting at sink Upper Body Bathing: Supervision/safety Where Assessed-Upper Body Bathing: Sitting at sink Lower Body Bathing: Dependent (x2 assist using stedy to stand and wash buttocks) Where Assessed-Lower Body Bathing: Standing at sink, Other (Comment) (stedy) Upper Body Dressing: Minimal assistance (to pull over head) Where Assessed-Upper Body Dressing: Wheelchair Lower Body Dressing: Dependent (able to thread L LE into brief with reacher only. Total A for pants management and 2 people using stedy) Where Assessed-Lower Body Dressing: Standing at sink, Other (Comment) (stedy) Toileting: Dependent (purewick) Where Assessed-Toileting: Bed level Toilet Transfer:  Unable to assess Toilet Transfer Method: Unable to assess Tub/Shower Transfer: Unable to assess Tub/Shower Transfer Method: Unable to assess [employer] Transfer: Unable to assess [employer] Transfer Method: Unable to assess Vision   Perception    Praxis   Balance   Exercises:   Other Treatments:     Therapy/Group: Individual Therapy  Yvonne Kendall 06/05/2022, 12:56 PM

## 2022-06-05 NOTE — Progress Notes (Signed)
+/-   sleep. Purewick leaked x 1, requiring pad, diaper and gown change. Dressing to sacrum changed, macerated skin, but no breakdown. During my initial assessment observed DTI to right heel-foam dressing and PRAFO boot applied. Incisions to BLE's intact with sutures-OTA. At 2016, PRN robaxin given with scheduled tylenol. Daughter at bedside. Patrici Ranks A
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
## 2022-06-05 NOTE — IPOC Note (Signed)
Overall Plan of Care [facility]) Patient Details Name: Gabriela [name] MRN: [id number] DOB: 02-09-1926  Admitting Diagnosis: Oth fracture of unsp femur, init encntr for closed fracture [facility])  Hospital Problems: Principal Problem:   Oth fracture of unsp femur, init encntr for closed fracture [facility])     Functional Problem List: Nursing Bladder, Medication Management, Safety, Bowel, Pain, Endurance  PT Balance, Safety, Endurance, Motor, Pain  OT Balance, Cognition, Endurance, Motor, Pain  SLP    TR         Basic ADL's: OT Grooming, Bathing, Dressing, Toileting     Advanced  ADL's: OT       Transfers: PT Bed Mobility, Bed to Chair, Car, Manufacturing systems engineer, Metallurgist: PT Ambulation, Emergency planning/management officer     Additional Impairments: OT None  SLP        TR      Anticipated Outcomes Item Anticipated Outcome  Self Feeding Set up A  Swallowing      Basic self-care  Supervision-CGA  Toileting  CGA   Bathroom Transfers CGA  Bowel/Bladder  manage bowel  with mod I and bladder w toileting  Transfers  supervision transfers  Locomotion  CGA/supervision gait.  Communication     Cognition     Pain  < 4 with prns  Safety/Judgment  maintain w cues   Therapy Plan: PT Intensity: Minimum of 1-2 x/day ,45 to 90 minutes PT Frequency: 5 out of 7 days PT Duration Estimated Length of Stay: 14-18 days. OT Intensity: Minimum of 1-2 x/day, 45 to 90 minutes OT Frequency: 5 out of 7 days OT Duration/Estimated Length of Stay: 14-18 days     Team Interventions: Nursing Interventions Disease Management/Prevention, Bladder Management, Medication Management, Discharge Planning, Pain Management, Bowel Management, Patient/Family Education  PT interventions Ambulation/gait training, Community reintegration, Neuromuscular re-education, UE/LE Strength taining/ROM, Wheelchair propulsion/positioning, Training and development officer, Pain management, Therapeutic Activities,  UE/LE Coordination activities, Functional mobility training, Patient/family education, Therapeutic Exercise  OT Interventions Balance/vestibular training, Discharge planning, Pain management, Self Care/advanced ADL retraining, Therapeutic Activities, UE/LE Coordination activities, Cognitive remediation/compensation, Disease mangement/prevention, Functional mobility training, Patient/family education, Skin care/wound managment, Therapeutic Exercise, DME/adaptive equipment instruction, Psychosocial support, UE/LE Strength taining/ROM, Wheelchair propulsion/positioning  SLP Interventions    TR Interventions    SW/CM Interventions Discharge Planning, Psychosocial Support, Patient/Family Education, Disease Management/Prevention   Barriers to Discharge MD  Medical stability, Home enviroment access/loayout, Wound care, Weight, and Weight bearing restrictions  Nursing Decreased caregiver support 1 level/level entry w family  PT Inaccessible home environment    OT Home environment access/layout, Weight bearing restrictions, Incontinence    SLP      [place], Insurance for SNF coverage     Team Discharge Planning: Destination: PT-Home ,OT- Home , SLP-  Projected Follow-up: PT-Home health PT, OT-  24 hour supervision/assistance, Home health OT, SLP-  Projected Equipment Needs: PT-To be determined, OT- To be determined, SLP-  Equipment Details: PT-probably will need w/c., OT-  Patient/family involved in discharge planning: PT- Patient, Family member/caregiver,  OT-Patient, Family member/caregiver, SLP-   MD ELOS: 14-18 days Medical Rehab Prognosis:  Good Assessment: The patient has been admitted for CIR therapies with the diagnosis of B/L leg surgeries. The team will be addressing functional mobility, strength, stamina, balance, safety, adaptive techniques and equipment, self-care, bowel and bladder mgt, patient and caregiver education, skin mgmt- has DTI R heel. Goals have been set at CGA-min  A. Anticipated discharge destination is home with daughter.  See Team Conference Notes for weekly updates to the plan of care

## 2022-06-05 NOTE — Consult Note (Signed)
WOC Nurse Consult Note: Reason for Consult:deep tissue pressure injury noted to right heel Wound type:Pressure Pressure Injury POA: No Measurement: 3cm x 2.5cm (measured by Bedside RN, Anson Crofts  Wound DUP:BDHDIX discoloration Drainage (amount, consistency, odor) None Periwound:intact, dry Dressing procedure/placement/frequency:Wound care to the right heel DTPI will consist of covering the lesion with an antimicrobial nonadherent (xeroform) gauze and topping it with a dry gauze prior to placing a silicone foam dressing for the heal to cover. The xeroform is to be changed daily and the silicone foam may be used for up to 3 days, change PRN for soling of dressing edges or dressing dislodgement. Heels are to be placed into Prevalon pressure redistribution heel boots while in bed or chair.  A sacral prophylactic foam is to be placed for PI prevention and a pressure redistribution chair cushion used in the chair when OOB. (This is to be sent home with patient at time of discharge.)  [place] nursing team will follow, seeing every 7-10 days and will remain available to this patient, the nursing and medical teams.    Thank you for inviting [place] to participate in this patient's Plan of Care.  Maudie Flakes, MSN, RN, CNS, [place], Serita Grammes, [employer], [employer] phone:  [phone]

## 2022-06-06 ENCOUNTER — Inpatient Hospital Stay (HOSPITAL_COMMUNITY): Payer: Medicare Other

## 2022-06-06 LAB — CBC
HCT: 25.7 % — ABNORMAL LOW (ref 36.0–46.0)
Hemoglobin: 8.3 g/dL — ABNORMAL LOW (ref 12.0–15.0)
MCH: 27.7 pg (ref 26.0–34.0)
MCHC: 32.3 g/dL (ref 30.0–36.0)
MCV: 85.7 fL (ref 80.0–100.0)
Platelets: 46 10*3/uL — ABNORMAL LOW (ref 150–400)
RBC: 3 MIL/uL — ABNORMAL LOW (ref 3.87–5.11)
RDW: 18.9 % — ABNORMAL HIGH (ref 11.5–15.5)
WBC: 3.1 10*3/uL — ABNORMAL LOW (ref 4.0–10.5)
nRBC: 0 % (ref 0.0–0.2)

## 2022-06-06 LAB — BASIC METABOLIC PANEL
Anion gap: 8 (ref 5–15)
BUN: 33 mg/dL — ABNORMAL HIGH (ref 8–23)
CO2: 19 mmol/L — ABNORMAL LOW (ref 22–32)
Calcium: 8.4 mg/dL — ABNORMAL LOW (ref 8.9–10.3)
Chloride: 114 mmol/L — ABNORMAL HIGH (ref 98–111)
Creatinine, Ser: 1.13 mg/dL — ABNORMAL HIGH (ref 0.44–1.00)
GFR, Estimated: 45 mL/min — ABNORMAL LOW (ref >60)
Glucose, Bld: 82 mg/dL (ref 70–99)
Potassium: 4.5 mmol/L (ref 3.5–5.1)
Sodium: 141 mmol/L (ref 135–145)

## 2022-06-06 MED ORDER — OXYCODONE HCL 5 MG PO TABS
5.0000 mg | ORAL_TABLET | Freq: Two times a day (BID) | ORAL | Status: DC
  Administered 2022-06-07 – 2022-06-22 (×30): 5 mg via ORAL
  Filled 2022-06-06 (×31): qty 1

## 2022-06-06 MED ORDER — ORAL CARE MOUTH RINSE: 15.0000 mL | OROMUCOSAL | Status: DC | PRN

## 2022-06-06 MED ORDER — OXYCODONE HCL 5 MG PO TABS: 2.5000 mg | ORAL_TABLET | Freq: Two times a day (BID) | ORAL | Status: DC

## 2022-06-06 MED ORDER — DICLOFENAC SODIUM 1 % EX GEL
2.0000 g | Freq: Three times a day (TID) | CUTANEOUS | Status: DC
  Administered 2022-06-06 – 2022-06-07 (×3): 2 g via TOPICAL
  Filled 2022-06-06: qty 100

## 2022-06-06 NOTE — Progress Notes (Signed)
Occupational Therapy Session Note  Patient Details  Name: Gabriela [name] MRN: [id number] Date of Birth: December 07, 1925  Today's Date: 06/06/2022 OT Individual Time: 1120-1203 OT Individual Time Calculation (min): 43 min    Short Term Goals: Week 1:  OT Short Term Goal 1 (Week 1): Pt will complete 1/3 toileting steps with CGA OT Short Term Goal 2 (Week 1): Pt will complete toilet transfer with Mod A in prep for OOB toileting OT Short Term Goal 3 (Week 1): Pt will use AE PRN to thread pants with supervision for balance OT Short Term Goal 4 (Week 1): Pt will sit > stand in prep for ADL with mod A using LRAD  Skilled Therapeutic Interventions/Progress Updates:  Skilled OT intervention completed with focus on anterior weight shifting in prep for LB clothing management/transfers and functional sit > stands. Pt received seated in w/c, reported 6/10 in bilateral femurs, however reported being pre-medicated. Therapist offered rest breaks and repositioning for pain reduction throughout.  Transported dependently in w/c <> gym for energy conservation. Squat pivot with max A from w/c > EOM with max cues needed for hand positioning. Pt continues to exhibit rigid movements/fearfulness with weight shifting in transfers especially with forward leans. Focused on anterior weight shifting at [facility] with therapist in front of pt for comfort, while pt retrieved 10+ clips on each LE with CGA for balance. Pt had more success when using hand not in use on therapist's thigh for steadying. Discussed relating therapist's thighs to EOB/mat or w/c for pushing up to stand during anterior lean.   Transitioned to blocked practice sit >stands in stedy. Required 3 attempts to achieve semi-stance with trunk heavily flexed with max A, while 2nd person manipulated stedy seat pads. Dependent transfer to w/c, with max A needed to lift bottom from perched position, increasing to +2 A for stance as pt attempting to sit with only 1 seat pad  moved, with 3rd person needed due for pad management due to increased fatigue.   Back in room, granddaughter expressed concerns regarding pt's L knee, as pt states "my leg just don't want to work and that isn't even the leg I had trouble with before." Therapist offered education on diagnosis, typical progression, and functional goals, however therapist alerted care team of family's concern per request with PA also aware on rounds discussing potential need of scan or routine pain meds. Pt remained seated in w/c, with granddaughter present and all needs in reach at end of session.   Therapy Documentation Precautions:  Precautions Precautions: Fall Precaution Comments: HOH Restrictions Weight Bearing Restrictions: Yes RLE Weight Bearing: Weight bearing as tolerated LLE Weight Bearing: Weight bearing as tolerated Other Position/Activity Restrictions: Transfers only    Therapy/Group: Individual Therapy  Blase Mess, MS, OTR/L  06/06/2022, 12:35 PM
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
## 2022-06-06 NOTE — Progress Notes (Signed)
Occupational Therapy Session Note  Patient Details  Name: Gabriela [name] MRN: [id number] Date of Birth: 09-13-1925  Today's Date: 06/06/2022 OT Individual Time: 1300-1415 OT Individual Time Calculation (min): 75 min    Short Term Goals: Week 1:  OT Short Term Goal 1 (Week 1): Pt will complete 1/3 toileting steps with CGA OT Short Term Goal 2 (Week 1): Pt will complete toilet transfer with Mod A in prep for OOB toileting OT Short Term Goal 3 (Week 1): Pt will use AE PRN to thread pants with supervision for balance OT Short Term Goal 4 (Week 1): Pt will sit > stand in prep for ADL with mod A using LRAD  Skilled Therapeutic Interventions/Progress Updates:    Upon OT arrival, pt seated in w/c with granddaughter present in room and reports pain in L LE 7/10. Pt agreeable to OT treatment session. Treatment intervention with a focus on self care retraining, family education, item retrieval using AE, and UE strengthening. Pt completes footwear including socks and shoes with Mod A using reacher, sock aid, and shoe horn. Pt able to doff/donn R and L socks using sock aid and requires assist to get heel in shoes and tie shoes. Education provided to granddaughter about role of OT, plan of care, and discharge planning. Pt's granddaughter appreciative of explanation. Pt sits in w/c to retrieve items from floor that she had dropped. Pt completes with min difficulty and is able to throw away items into the garbage. Pt was transported to main therapy gym via w/c and total A and sits at tabletop to flip over [employer] and sort items using B UE and 1.5lb wrist weights. Pt demonstrates min difficulty to complete and requires increased time. Pt with min errors and requires verbal cues to recognize. Pt was returned to her room via w/c and total A and completes stand pivot transfer into bed with Max A. Pt completes sit to supine transfer with Mod A and was left in bed at end of session with all needs  met.  Therapy Documentation Precautions:  Precautions Precautions: Fall Precaution Comments: HOH Restrictions Weight Bearing Restrictions: Yes RLE Weight Bearing: Weight bearing as tolerated LLE Weight Bearing: Weight bearing as tolerated Other Position/Activity Restrictions: Transfers only      Therapy/Group: Individual Therapy  [name] 06/06/2022, 1:39 PM

## 2022-06-06 NOTE — Progress Notes (Addendum)
Wound Plan   Braden Score: 14 Sensory: 3 Moisture: 2 Activity: 2 Mobility: 2 Nutrition: 3 Friction: 2  Wounds present:  05/29/22 Partial thickness area of skin loss (Stage 2). Photo provided by EDP. Wound type:pressure Pressure Injury POA: Yes  Dressing procedure/placement/frequency:I have provided Nursing with guidance for turning and repositioning, floatation of heels and for topical care with a silicone foam dressing, with every other day changes Interventions:  06/02/22: -    Air Mattress -    Elevate heels off bed -    WBAT bil LE  -    Vitamin D,  and Vitamin B12, MVI -    Nepro supplement -    WOC following  Wounds Present:  06/05/22: Deep tissue pressure injury noted to right heel Wound type:Pressure Pressure Injury POA: No Measurement: 3cm x 2.5cm (measured by Bedside RN, Anson Crofts  Wound VXY:IAXKPV discoloration Drainage (amount, consistency, odor) None Periwound:intact, dry  Dressing procedure/placement/frequency:Wound care to the right heel DTPI will consist of covering the lesion with an antimicrobial nonadherent (xeroform) gauze and topping it with a dry gauze prior to placing a silicone foam dressing for the heal to cover.  Interventions:  -    Prevalon boots/PRAFO boots at HS -    Sacral prophylactic foam -    Pressure redistrubution chair pad -    WOC following -    Maximove for transfers; working on squat pivot transfers vs lateral scoots w therapy -    AFO assessment; right foot drop  Attendees: Rayne Du, LPN; Dorien Chihuahua, RN;Hope Alphonsa Gin, OT; Verl Dicker, PT Dietician consulted

## 2022-06-06 NOTE — Progress Notes (Signed)
Physical Therapy Session Note  Patient Details  Name: Gabriela [name] MRN: [id number] Date of Birth: 20-Feb-1926  Today's Date: 06/06/2022 PT Individual Time: 6808-8110 PT Individual Time Calculation (min): 73 min   Short Term Goals: Week 1:  PT Short Term Goal 1 (Week 1): Pt will transfer to R w/ min A consistently. PT Short Term Goal 2 (Week 1): Pt will transfer sit to stand w/ mod A. PT Short Term Goal 3 (Week 1): Pt will transfer w/c <> bed w/ min A and SB. PT Short Term Goal 4 (Week 1): PT will assess gait.  Skilled Therapeutic Interventions/Progress Updates:      Therapy Documentation Precautions:  Precautions Precautions: Fall Precaution Comments: HOH Restrictions Weight Bearing Restrictions: Yes RLE Weight Bearing: Weight bearing as tolerated LLE Weight Bearing: Weight bearing as tolerated Other Position/Activity Restrictions: Transfers only  Pt received semi-reclined in bed agreeable to PT session with emphasis on LE strengthening and transfer training. Pt reports 6/10 bilateral knee pain, pre-medicated and provided with rest breaks for relief. Pt requires CGA with rolling and dependent for lower body dressing for time management. Pt requires min A for LE management with supine to sit and performed upper body dressing with (S) edge of bed. Pt mod A with squat pivot transfer to w/c and requires verbal and tactile cueing for sequencing and hand placement. Pt propelled w/c ~50 ft with min A for navigation of obstacles and transported remaining distance to dayroom for time management and energy conservation. Pt mod A with squat pivot to mat and pt particpated in blocked practice of sit to stand. Pt unable to perform with RW and max A x 1 and progressed to sit to stand with +2 HHA and pt able to achieve upright position. Pt requires verbal and tactile cueing to encourage posterior chain activation. Transitioned to stedy transfers from elevated flaps and requires max A to achieve  upright position with visual feedback from mirror. Pt transported to room and left seated in w/c at bedside with chair alarm on and all needs within reach.    Therapy/Group: Individual Therapy  Verl Dicker Verl Dicker PT, DPT  06/06/2022, 7:50 AM

## 2022-06-07 LAB — CBC WITH DIFFERENTIAL/PLATELET
Abs Immature Granulocytes: 0.04 10*3/uL (ref 0.00–0.07)
Basophils Absolute: 0 10*3/uL (ref 0.0–0.1)
Basophils Relative: 1 %
Eosinophils Absolute: 0 10*3/uL (ref 0.0–0.5)
Eosinophils Relative: 0 %
HCT: 25.1 % — ABNORMAL LOW (ref 36.0–46.0)
Hemoglobin: 8.2 g/dL — ABNORMAL LOW (ref 12.0–15.0)
Immature Granulocytes: 1 %
Lymphocytes Relative: 30 %
Lymphs Abs: 1 10*3/uL (ref 0.7–4.0)
MCH: 27.9 pg (ref 26.0–34.0)
MCHC: 32.7 g/dL (ref 30.0–36.0)
MCV: 85.4 fL (ref 80.0–100.0)
Monocytes Absolute: 1.2 10*3/uL — ABNORMAL HIGH (ref 0.1–1.0)
Monocytes Relative: 34 %
Neutro Abs: 1.1 10*3/uL — ABNORMAL LOW (ref 1.7–7.7)
Neutrophils Relative %: 34 %
Platelets: 43 10*3/uL — ABNORMAL LOW (ref 150–400)
RBC: 2.94 MIL/uL — ABNORMAL LOW (ref 3.87–5.11)
RDW: 19.2 % — ABNORMAL HIGH (ref 11.5–15.5)
WBC: 3.3 10*3/uL — ABNORMAL LOW (ref 4.0–10.5)
nRBC: 0 % (ref 0.0–0.2)

## 2022-06-07 MED ORDER — DICLOFENAC SODIUM 1 % EX GEL
2.0000 g | Freq: Four times a day (QID) | CUTANEOUS | Status: DC
  Administered 2022-06-07 – 2022-06-22 (×58): 2 g via TOPICAL
  Filled 2022-06-07: qty 100

## 2022-06-07 NOTE — Plan of Care (Signed)
  Problem: RH Ambulation Goal: LTG Patient will ambulate in controlled environment (PT) Description: LTG: Patient will ambulate in a controlled environment, # of feet with assistance (PT). Outcome: Not Applicable Note: WBAT transfers only. Will be wheelchair level at DC Goal: LTG Patient will ambulate in home environment (PT) Description: LTG: Patient will ambulate in home environment, # of feet with assistance (PT). Outcome: Not Applicable Note: WBAT transfers only. Will be wheelchair level at DC   Problem: RH Stairs Goal: LTG Patient will ambulate up and down stairs w/assist (PT) Description: LTG: Patient will ambulate up and down # of stairs with assistance (PT) Outcome: Not Applicable Note: WBAT transfers only. Will be wheelchair level at DC

## 2022-06-07 NOTE — Progress Notes (Addendum)
Patient ID: Gabriela [name], female   DOB: 27-May-1926, 88 y.o.   MRN: [id number]  SW received phone call from patient daughter on 10/23 requesting for staff to use gait belt on patient for comfort/safety. Due to the experience patient had working without it with therapies in the past. SW informed therapy team.
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
## 2022-06-07 NOTE — Progress Notes (Signed)
Occupational Therapy Session Note  Patient Details  Name: Gabriela [name] MRN: [id number] Date of Birth: 07-25-1926  Today's Date: 06/07/2022 OT Individual Time: 1005-1103 & [id number]  OT Individual Time Calculation (min): 58 min & 74 min   Short Term Goals: Week 1:  OT Short Term Goal 1 (Week 1): Pt will complete 1/3 toileting steps with CGA OT Short Term Goal 2 (Week 1): Pt will complete toilet transfer with Mod A in prep for OOB toileting OT Short Term Goal 3 (Week 1): Pt will use AE PRN to thread pants with supervision for balance OT Short Term Goal 4 (Week 1): Pt will sit > stand in prep for ADL with mod A using LRAD  Skilled Therapeutic Interventions/Progress Updates:  Session 1 Skilled OT intervention completed with focus on SB transfers, anterior weight shifting, BUE endurance. Pt received seated in w/c, reporting unrated pain in L thigh, checked in with nursing however pt pre-medicated. Offered modifications for transfers and repositioning for pain reduction throughout.  Transported dependently in w/c <> gym. Per prior PT, pt did well with slide board transfers, therefore utilized such during session. Pt was able to teach back correct steps for setting up SB transfer with min A. Dependent placement of board under R hip, then mod A transfer to EOM. Max cues needed for positioning of feet, hands and scooting.   Seated EOM, pt participated in the following for anterior weight shifting, and BUE endurance: -placement and removal of squigz on bottom portion of tall mirror, CGA to supervision needed for sitting balance after cues provided for widening her BLE BOS -placement and removal of squigz on mid portion of tall mirror, supervision for balance -with 2 pound dowel, bicep flexion, chest presses, overhead/diagonal presses, thigh to shin slides (all x10)  Slideboard transfer from EOM > w/c, with min A due to decline. Max cues needed still for sequencing. Back in room, pt remained  seated in w/c, with belt alarm on, ice applied (per granddaughter vs pt request, though did educate pt that she could remove if not desired) on bilateral femurs for swelling prevention, and all needs met at end of session.  Session 2 Skilled OT intervention completed with focus on SB transfers, w/c cushion swap out for pressure relief, anterior weight shift, toileting needs. Pt received seated in w/c, with 6/10 pain in L knee as well as fatigue. Nursing notified of pain med request, with rest breaks and repositioning provided for pain reduction throughout.  Transported dependently in w/c <> gym for time. Slideboard transfer with max A and max cues > L side from w/c > EOM. Continues to demo lack of anterior weight shift and WB through bilateral feet during transfer.   Seated EOM, pt participated in the following to promote independence with SB transfers: -anterior weight shift with placement/retrieval of cone on w/c, encouraged to use BUE however with fear demonstrated with BUE being unsupported. Cues provided on setting up her BOS from narrow to wide, with use of ball in between thighs for prevention of knee valgus and internal rotation during leans -seated clam shells, AROM x10, then 2x15 with yellow theraband around thighs  Therapist switched out standard w/c cushion for 16x16 roho for pressure relief due to skin breakdown and pt tendency to sit in chair for long periods of time vs bed.  Slideboard transfer on slight decline > R side with min A, with improved carryover of anterior leans. Back in room slideboard transfer with mod A from w/c > air  bed. Total A for sit > supine with attempt to educate on side lying however too much increase in pain during transition. In supine, pt was noted to be heavily incontinent of void only. Doffed pants, brief at bed level with max A, supervision for rolling R<>L with use of bed rails. Pt able to wipe front peri-area. Donned brief with total A. Doffed shirt/changed  into personal gown with max A. Nursing notified of pt status with incontinence with potential need for timed toileting as pt states she hadn't gone all day and didn't feel the urge to go. PRAFO boot donned to R foot, pillow placed in between thighs for hip positioning due to hip abduction wedge not in room yet, bed alarm on and all needs in reach at end of session.   Therapy Documentation Precautions:  Precautions Precautions: Fall Precaution Comments: HOH Restrictions Weight Bearing Restrictions: Yes RLE Weight Bearing: Weight bearing as tolerated LLE Weight Bearing: Weight bearing as tolerated Other Position/Activity Restrictions: Transfers only    Therapy/Group: Individual Therapy  Blase Mess, MS, OTR/L  06/07/2022, 3:31 PM

## 2022-06-07 NOTE — Progress Notes (Signed)
Physical Therapy Session Note  Patient Details  Name: Gabriela [name] MRN: [id number] Date of Birth: May 16, 1926  Today's Date: 06/07/2022 PT Individual Time: 0800-0859 PT Individual Time Calculation (min): 59 min   Short Term Goals: Week 1:  PT Short Term Goal 1 (Week 1): Pt will transfer to R w/ min A consistently. PT Short Term Goal 2 (Week 1): Pt will transfer sit to stand w/ mod A. PT Short Term Goal 3 (Week 1): Pt will transfer w/c <> bed w/ min A and SB. PT Short Term Goal 4 (Week 1): PT will assess gait.  Skilled Therapeutic Interventions/Progress Updates:       Pt resting in bed on arrival. She reports 6/10 L knee pain and that she received pain rx prior to PT session. Curled into somewhat fetal position with excessive internal rotation of RLE. Pt required assist for repositioning in bed, very painful with PROM for RLE into extension and external rotation to neutral. She had a pillow b/w legs but insufficient in promoting hip abduction. Asked care team for hip abduction pillow for bed.   Removed purewick and assisted her with dressing at bed level. TotalA needed for LB dressing. Rolling to her L with minA using bed rails and totalA needed to roll to her R. Supine<>sitting EOB with maxA for BLE management and trunk support, HOB slightly elevated. Fair sitting balance with no LOB while finishing up UB dressing. minA for threading head for UB dressing. Tennis shoes donned dependently for time.   Squat<>pivot transfer with totalA (pt <25%) from EOB to w/c, towards her R side. Pt very fearful of putting weight through BLE and pain limiting movement patterns. Required totalA for repositioning in w.c.   Granddaughter entering the room and with questions re: sore on her R foot and tennis shoes. Also updated on DC plan for patients home - confirmed 1 lvl home with level entry access. Home is being remodled for more handicap accessible - widening doorways to 32-36inches.   Pt transported to  main rehab gym in w/c for time management.  Practiced blocked practice SB transfers from w/c to mat table. Demonstrated to her for improved understanding and carryover. Completed x4 SB transfers in total, at first requiring maxA but progressing to a light modA as she was provided ++ time to complete. Several small scoots needed to accomplish transfer and assist needed for repositioning feet at times to improve BOS and weight bearing during transfer.   Returned to room at wheelchair level and updated grand daughter on progress and tolerance to therapy. All needs met at end of session with patient sitting in w/c, grand daughter in room.     Therapy Documentation Precautions:  Precautions Precautions: Fall Precaution Comments: HOH Restrictions Weight Bearing Restrictions: Yes RLE Weight Bearing: Weight bearing as tolerated LLE Weight Bearing: Weight bearing as tolerated Other Position/Activity Restrictions: Transfers only General:    Therapy/Group: Individual Therapy  Alger Simons 06/07/2022, 7:37 AM

## 2022-06-08 ENCOUNTER — Inpatient Hospital Stay (HOSPITAL_COMMUNITY): Payer: Medicare Other

## 2022-06-08 LAB — CBC WITH DIFFERENTIAL/PLATELET
Abs Immature Granulocytes: 0.04 10*3/uL (ref 0.00–0.07)
Basophils Absolute: 0 10*3/uL (ref 0.0–0.1)
Basophils Relative: 0 %
Eosinophils Absolute: 0 10*3/uL (ref 0.0–0.5)
Eosinophils Relative: 0 %
HCT: 24.3 % — ABNORMAL LOW (ref 36.0–46.0)
Hemoglobin: 7.8 g/dL — ABNORMAL LOW (ref 12.0–15.0)
Immature Granulocytes: 1 %
Lymphocytes Relative: 34 %
Lymphs Abs: 1.2 10*3/uL (ref 0.7–4.0)
MCH: 27.7 pg (ref 26.0–34.0)
MCHC: 32.1 g/dL (ref 30.0–36.0)
MCV: 86.2 fL (ref 80.0–100.0)
Monocytes Absolute: 1.3 10*3/uL — ABNORMAL HIGH (ref 0.1–1.0)
Monocytes Relative: 38 %
Neutro Abs: 0.9 10*3/uL — ABNORMAL LOW (ref 1.7–7.7)
Neutrophils Relative %: 27 %
Platelets: 42 10*3/uL — ABNORMAL LOW (ref 150–400)
RBC: 2.82 MIL/uL — ABNORMAL LOW (ref 3.87–5.11)
RDW: 19.2 % — ABNORMAL HIGH (ref 11.5–15.5)
WBC: 3.5 10*3/uL — ABNORMAL LOW (ref 4.0–10.5)
nRBC: 0 % (ref 0.0–0.2)

## 2022-06-08 MED ORDER — CALCIUM CARBONATE ANTACID 420 MG PO CHEW: 420.0000 mg | CHEWABLE_TABLET | Freq: Every day | ORAL | Status: DC

## 2022-06-08 MED ORDER — MAGNESIUM SULFATE IN D5W 1-5 GM/100ML-% IV SOLN
1.0000 g | Freq: Once | INTRAVENOUS | Status: AC
  Administered 2022-06-08: 1 g via INTRAVENOUS
  Filled 2022-06-08: qty 100

## 2022-06-08 MED ORDER — CALCIUM CARBONATE ANTACID 500 MG PO CHEW
400.0000 mg | CHEWABLE_TABLET | Freq: Every day | ORAL | Status: DC
  Administered 2022-06-09 – 2022-06-22 (×14): 400 mg via ORAL
  Filled 2022-06-08 (×17): qty 2

## 2022-06-08 NOTE — Patient Care Conference (Signed)
Inpatient RehabilitationTeam Conference and Plan of Care Update Date: 06/08/2022   Time: 11:45 AM    Patient Name: Gabriela [name]      Medical Record Number: [id number]  Date of Birth: [birth year] Sex: Female         Room/Bed: 4W24C/4W24C-01 Payor Info: Payor: [employer] MEDICARE / Plan: [facility] MEDICARE / Product Type: *No Product type* /    Admit Date/Time:  06/02/2022  1:20 PM  Primary Diagnosis:  Oth fracture of unsp femur, init encntr for closed fracture [facility])  Hospital Problems: Principal Problem:   Oth fracture of unsp femur, init encntr for closed fracture [facility])    Expected Discharge Date: Expected Discharge Date: 06/18/22  Team Members Present: Physician leading conference: Dr. Leeroy Cha Social Worker Present: Erlene Quan, BSW Nurse Present: Dorien Chihuahua, RN PT Present: Other (comment) (McCord, PT) OT Present: Jennefer Bravo, OT PPS Coordinator present : Gunnar Fusi, SLP     Current Status/Progress Goal Weekly Team Focus  Bowel/Bladder     Incontinent of bowel and bladder; purewick at [facility]   Continent/incontinence managed   Timed toileting  Swallow/Nutrition/ Hydration             ADL's   Min A UB bathing/dressing, Max A LB bathing/dressing, Total A toileting  CGA to supervision  functional transfers, activity tolerance, general strengthening, pain management, ADL retraining   Mobility   Max/TotalA for bed mobility; Max/TotalA for squat pivot transfers; [place] slidboard transfers  Supv with transfers; Gait and stair goals discharged  strength, balance, pain management, endurance, transfers, w/c mobility   Communication             Safety/Cognition/ Behavioral Observations            Pain     Knee pain   Pain managed   MRI knee Hip adbuction pillow  Skin     DTI right heel Wound right thigh   Skin managed   Monitor skin q shift, pressure relief interventions in place    Discharge Planning:  Discharging home with assistance from  granddaughter 24/7   Team Discussion: Patient with knee pain and slow drop of Hgb; occult stool checks ordered. Calcium supplement added with dietary modification recommendations. Hx of foot drop right. Incontinent (unaware) despite timed toileting. Limited progress due to inability to stand.  Patient on target to meet rehab goals: Currently needs min assist for upper body bathing and dressing. Max assist for lower body bathing and dressing and total assist for toileting. Needs slide board for transfers and max - total assist.  *See Care Plan and progress notes for long and short-term goals.   Revisions to Treatment Plan:  Discharged gait and step goals Added hip abduction pillow Downgraded goals  Teaching Needs: Family has been educated on level of care needed at discharge related to no gait or steps and 24/7 care. Safety, medications, skin care, transfers, toileting, etc  Current Barriers to Discharge: Home enviroment access/layout, Incontinence, Wound care, and Lack of/limited family support Has IADL caregiver at home  Possible Resolutions to Barriers: Family education     Medical Summary Current Status: left knee pain, hip incision, incontinent, anemia  Barriers to Discharge: Medical stability;Wound care;Incontinence  Barriers to Discharge Comments: left knee pain, hip incision, incontinent, anemia Possible Resolutions to Barriers/Weekly Focus: MRI left knee ordered given continued pain despite stable XR, timed voiding ordered, daily monitoring of wound, monitor hemoglobin regularly   Continued Need for Acute Rehabilitation Level of Care: The patient requires  daily medical management by a physician with specialized training in physical medicine and rehabilitation for the following reasons: Direction of a multidisciplinary physical rehabilitation program to maximize functional independence : Yes Medical management of patient stability for increased activity during participation  in an intensive rehabilitation regime.: Yes Analysis of laboratory values and/or radiology reports with any subsequent need for medication adjustment and/or medical intervention. : Yes   I attest that I was present, lead the team conference, and concur with the assessment and plan of the team.   Dorien Chihuahua B 06/08/2022, 1:49 PM
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
## 2022-06-08 NOTE — Progress Notes (Signed)
Physical Therapy Session Note  Patient Details  Name: Gabriela [name] MRN: [id number] Date of Birth: 02-Nov-1925  Today's Date: 06/08/2022 PT Individual Time: 0800-0914 PT Individual Time Calculation (min): 74 min   Short Term Goals: Week 1:  PT Short Term Goal 1 (Week 1): Pt will transfer to R w/ min A consistently. PT Short Term Goal 2 (Week 1): Pt will transfer sit to stand w/ mod A. PT Short Term Goal 3 (Week 1): Pt will transfer w/c <> bed w/ min A and SB. PT Short Term Goal 4 (Week 1): PT will assess gait.  Skilled Therapeutic Interventions/Progress Updates:      Pt in bed finishing up her breakfast. LPN in room administering morning rx - including pain medication as she reports 6/10 RLE pain. Rest breaks, repositioning, and stretching provided for pain management.  Pt found with RLE severely internally rotated and both knee's flexed. She has B PRAFO's on but not assisting with limb positioning. Educated her on proper positioning in bed to prevent contractures and joint stiffness post surgery. Asked LPN to follow up on hip abduction pillow as this will help with positioning in bed.   Stretching of B hamstrings and hip rotators to promote more neutral position - pain limiting and very limited. Supine exercises in bed to promote joint mobility, strengthening, and active movement for bed mobility.  -hip abd/add 1x10 -heel slides 1x10 -quad sets 1x10 -SAQ 1x10 -Seated LAQ and hip marches 2x20 *AAROM for all exercises. Limited B knee extension in R > L, could be premorbid.   Donned pants at bed level with totalA (pt <25%) with pt semi-rolling (unable to tolerate full sidelying position) with modA both directions.   Supine<>sitting EOB with maxA for trunk support and BLE management - assist for forward scooting to EOB to improve BOS while sitting EOB to complete UB dressing. Able to remove night gown without assist or cues. minA for donning oversized t-shirt for threading head. Tennis  shoes donned with totalA.  SB transfer towards her R side from EOB - totalA for board placement and mod/maxA overall for completing transfer. Frequent repositioning of feet to improve BOS and weight bearing through LE to assist with transfer.  Pt placing in hearing aids to improve communication. Requires assist in donning hearing aids.    Transported to day room rehab gym for time.   Focused remainder of session on functional transfers using SB. Used yoga blocks to assist with UE leverage during transfer. Pt overall required modA for transferring both directions. Assist for hip translation across board and cues for forward weight shifting. totalA for board placement and for assisting with feet placement for BOS.   Returned to her room and remained seated in w/c at end of session. Safety belt alarm on, call bell in reach.   Therapy Documentation Precautions:  Precautions Precautions: Fall Precaution Comments: HOH Restrictions Weight Bearing Restrictions: Yes RLE Weight Bearing: Weight bearing as tolerated LLE Weight Bearing: Weight bearing as tolerated Other Position/Activity Restrictions: Transfers only General:    Therapy/Group: Individual Therapy  Alger Simons 06/08/2022, 7:34 AM

## 2022-06-08 NOTE — Plan of Care (Signed)
  Problem: RH Balance Goal: LTG Patient will maintain dynamic standing with ADLs (OT) Description: LTG:  Patient will maintain dynamic standing balance with assist during activities of daily living (OT)  Outcome: Not Applicable Flowsheets (Taken 06/08/2022 1232) LTG: Pt will maintain dynamic standing balance during ADLs with: (d/c due to poor progression, low activity tolerance and fear/pain levels with transition to modified transfers at w/c level) --   Problem: Sit to Stand Goal: LTG:  Patient will perform sit to stand in prep for activites of daily living with assistance level (OT) Description: LTG:  Patient will perform sit to stand in prep for activites of daily living with assistance level (OT) Outcome: Not Applicable Flowsheets (Taken 06/08/2022 1232) LTG: PT will perform sit to stand in prep for activites of daily living with assistance level: (d/c due to poor progression, low activity tolerance and fear/pain levels with transition to modified transfers at w/c level) --   Problem: RH Bathing Goal: LTG Patient will bathe all body parts with assist levels (OT) Description: LTG: Patient will bathe all body parts with assist levels (OT) Flowsheets (Taken 06/08/2022 1232) LTG: Pt will perform bathing with assistance level/cueing: (downgraded due slow progression, low activity tolerance and fear/pain levels with transition to modified transfers at w/c level) Minimal Assistance - Patient > 75%   Problem: RH Dressing Goal: LTG Patient will perform upper body dressing (OT) Description: LTG Patient will perform upper body dressing with assist, with/without cues (OT). Flowsheets (Taken 06/08/2022 1232) LTG: Pt will perform upper body dressing with assistance level of: (downgraded due slow progression, low activity tolerance and fear/pain levels with transition to modified transfers at w/c level) Set up assist Goal: LTG Patient will perform lower body dressing w/assist (OT) Description: LTG:  Patient will perform lower body dressing with assist, with/without cues in positioning using equipment (OT) Flowsheets (Taken 06/08/2022 1232) LTG: Pt will perform lower body dressing with assistance level of: (downgraded due slow progression, low activity tolerance and fear/pain levels with transition to modified transfers at w/c level) Minimal Assistance - Patient > 75%   Problem: RH Toileting Goal: LTG Patient will perform toileting task (3/3 steps) with assistance level (OT) Description: LTG: Patient will perform toileting task (3/3 steps) with assistance level (OT)  Flowsheets (Taken 06/08/2022 1232) LTG: Pt will perform toileting task (3/3 steps) with assistance level: (downgraded due slow progression, low activity tolerance and fear/pain levels with transition to modified transfers at w/c level) Minimal Assistance - Patient > 75%   Problem: RH Toilet Transfers Goal: LTG Patient will perform toilet transfers w/assist (OT) Description: LTG: Patient will perform toilet transfers with assist, with/without cues using equipment (OT) Flowsheets (Taken 06/08/2022 1232) LTG: Pt will perform toilet transfers with assistance level of: (downgraded due slow progression, low activity tolerance and fear/pain levels with transition to modified transfers at w/c level) Minimal Assistance - Patient > 75%   Problem: RH Tub/Shower Transfers Goal: LTG Patient will perform tub/shower transfers w/assist (OT) Description: LTG: Patient will perform tub/shower transfers with assist, with/without cues using equipment (OT) Flowsheets (Taken 06/08/2022 1232) LTG: Pt will perform tub/shower stall transfers with assistance level of: (downgraded due slow progression, low activity tolerance and fear/pain levels with transition to modified transfers at w/c level) Minimal Assistance - Patient > 75%

## 2022-06-08 NOTE — Progress Notes (Signed)
Patient ID: Gabriela [name], female   DOB: 06-15-1926, 88 y.o.   MRN: [id number]  Team Conference Report to Patient/Family  Team Conference discussion was reviewed with the patient and caregiver, including goals, any changes in plan of care and target discharge date.  Patient and caregiver express understanding and are in agreement.  The patient has a target discharge date of 06/18/22.  SW met with patient and daughter and provided team conference updates. Patient's grand daughter requesting to have therapy sessions more spaced out. Granddaughter also concerned about the focus in OT and feels as OT only focuses on standing and no ADL care, so will follow up with therapy team. Granddaughter would like follow up on MRI once complete and feels as patient will require more time on CIR. Sw informed granddaughter that every Wednesday the team reassesses and we will discuss. No additional questions or concerns.  Dyanne Iha 06/08/2022, 3:08 PM

## 2022-06-08 NOTE — Progress Notes (Signed)
Occupational Therapy Session Note  Patient Details  Name: Gabriela [name] MRN: [id number] Date of Birth: 1926/03/05  Today's Date: 06/08/2022 OT Individual Time: 1032-1129 & 1400-1510 OT Individual Time Calculation (min): 57 min & 70 min   Short Term Goals: Week 1:  OT Short Term Goal 1 (Week 1): Pt will complete 1/3 toileting steps with CGA OT Short Term Goal 2 (Week 1): Pt will complete toilet transfer with Mod A in prep for OOB toileting OT Short Term Goal 3 (Week 1): Pt will use AE PRN to thread pants with supervision for balance OT Short Term Goal 4 (Week 1): Pt will sit > stand in prep for ADL with mod A using LRAD  Skilled Therapeutic Interventions/Progress Updates:  Session 1 Skilled OT intervention completed with focus on pt/family education, d/c planning, functional transfers and toileting needs. Pt received seated in w/c, no pain indicated during session.  Pt's granddaughter had several questions regarding pt's POC, rehab goals and medical needs (MRI status & air bed vs standard bed due to no more buttock pressure sore, which therapist deferred to nursing/MD due to time constraint). Therapist educated pt and granddaughter on focus of transfers being modified to wheelchair level in prep for d/c including slideboard/squat pivots due to slow progression with standing, low activity tolerance and pain/fear levels that have impacted her overall independence. Granddaughter expressed that pt is fearful of a female assisting her vs female (in relation to OT/PT roles), however when pt is asked directly- she denied such feelings. Informed her that OT works in collaboration with PT in deciding the above recommendations for maximal success and safety at discharge. However, she continued to express her personal goals for pt to stand and be at a higher level in terms of care giving requirements as she feels unable to care for her at anything less than supervision. CSW notified of goal downgrade and  needs for physical assist preparation for returning home.  Transported dependently in w/c <> gym. Education provided on how to position w/c next to mat in prep for SB transfer. Max cues and min physical A needed for properly setting up for transfer. Attempted to have pt teach back correct steps for completing SB transfer, however with difficulty. Dependent placement of board, then max A transfer <> EOM. Max cues/physical placement of bilateral feet/hands throughout. Pt's bottom was noted to be wet from suspected incontinence, therefore required increased assist for SB due to friction, however continued to demo limited hip clearance and BUE use for clearance of board.  Back in room, attempted max A SB transfer from w/c > EOB, however pt unable to slide requiring total A squat pivot for safety. Cues needed to prevent pt holding onto arm rests, with pt stating she continues to be fearful of falling. Education provided on avoiding this to prevent her or helper getting hurt in the transition. Total A for EOB > supine bed mobility. Doffed pants with total A. Brief with incontinent void. NT notified of more frequent toileting checks and direct care handoff for toileting needs at bed level. Pt remained supine in bed with all immediate needs met at end of session  Session 2 Skilled OT intervention completed with focus on wound care, toileting needs, functional transfers and w/c mobility in prep for BADLs. Pt received semi-supine in bed, no c/o pain.   Wound care nurse present. Therapist assisted with bed mobility and positioning to eliminate pain in BLE with hip abduction wedge already in place. Pt able to roll  R<>L with min A.  Upon inspection of sacral wound, pt was noted to be incontinent of void. Utilized series of rolls R<>L with min A and heavy use of bed rails for max A removal of pants, total A doffing of brief, max A pericare with pt able to wipe anteriorly. New pants donned with total A.   Transitioned  with HOB elevated to EOB with max A, for BLE management and trunk elevation with HHA. With increased time, series of scoots, and on declined surface, pt was able to SB transfer from EOM > w/c with min A. Pt self-initiated placement of feet however needed some assist to position.   Self-propelled in w/c about 100 ft, though slow pace, with initial min A fading to supervision with education provided on management of wheels and turns. Had pt practice navigating between 4 cones to prep for doorway management in home. Required initial min A fading to supervision for 2 cones without cues needed but increased time for processing.   Transported back to room, mod A SB transfer from w/c > EOB, cued pt for L side lying with CGA for balance, then max A sidelying > supine transition for BLE management. Doffed pants, place abduction wedge with total A. Pt remained upright in bed, with heels elevated off of mattress for pressure relief with towels on calves, bed alarm on/activated, and with all needs in reach at end of session   Therapy Documentation Precautions:  Precautions Precautions: Fall Precaution Comments: HOH Restrictions Weight Bearing Restrictions: Yes RLE Weight Bearing: Weight bearing as tolerated LLE Weight Bearing: Weight bearing as tolerated Other Position/Activity Restrictions: Transfers only    Therapy/Group: Individual Therapy  Blase Mess, MS, OTR/L  06/08/2022, 3:33 PM

## 2022-06-08 NOTE — Progress Notes (Signed)
Orthopedic Tech Progress Note Patient Details:  Domnique Vanegas [facility] [birth year] [id number]  Ortho Devices Type of Ortho Device: Abduction pillow Ortho Device/Splint Location: BLE/HIPS Ortho Device/Splint Interventions: Ordered, Application, Adjustment   Post Interventions Patient Tolerated: Well Instructions Provided: Care of [place] 06/08/2022, 2:12 PM

## 2022-06-09 LAB — CBC WITH DIFFERENTIAL/PLATELET
Abs Immature Granulocytes: 0.03 10*3/uL (ref 0.00–0.07)
Basophils Absolute: 0 10*3/uL (ref 0.0–0.1)
Basophils Relative: 1 %
Eosinophils Absolute: 0 10*3/uL (ref 0.0–0.5)
Eosinophils Relative: 0 %
HCT: 25.5 % — ABNORMAL LOW (ref 36.0–46.0)
Hemoglobin: 7.9 g/dL — ABNORMAL LOW (ref 12.0–15.0)
Immature Granulocytes: 1 %
Lymphocytes Relative: 35 %
Lymphs Abs: 1.2 10*3/uL (ref 0.7–4.0)
MCH: 27.1 pg (ref 26.0–34.0)
MCHC: 31 g/dL (ref 30.0–36.0)
MCV: 87.3 fL (ref 80.0–100.0)
Monocytes Absolute: 1.3 10*3/uL — ABNORMAL HIGH (ref 0.1–1.0)
Monocytes Relative: 37 %
Neutro Abs: 0.9 10*3/uL — ABNORMAL LOW (ref 1.7–7.7)
Neutrophils Relative %: 26 %
Platelets: 46 10*3/uL — ABNORMAL LOW (ref 150–400)
RBC: 2.92 MIL/uL — ABNORMAL LOW (ref 3.87–5.11)
RDW: 19.2 % — ABNORMAL HIGH (ref 11.5–15.5)
WBC: 3.3 10*3/uL — ABNORMAL LOW (ref 4.0–10.5)
nRBC: 0 % (ref 0.0–0.2)

## 2022-06-09 LAB — BASIC METABOLIC PANEL
Anion gap: 9 (ref 5–15)
BUN: 28 mg/dL — ABNORMAL HIGH (ref 8–23)
CO2: 19 mmol/L — ABNORMAL LOW (ref 22–32)
Calcium: 8.6 mg/dL — ABNORMAL LOW (ref 8.9–10.3)
Chloride: 111 mmol/L (ref 98–111)
Creatinine, Ser: 1.08 mg/dL — ABNORMAL HIGH (ref 0.44–1.00)
GFR, Estimated: 48 mL/min — ABNORMAL LOW (ref >60)
Glucose, Bld: 75 mg/dL (ref 70–99)
Potassium: 4.2 mmol/L (ref 3.5–5.1)
Sodium: 139 mmol/L (ref 135–145)

## 2022-06-09 LAB — PATHOLOGIST SMEAR REVIEW

## 2022-06-09 MED ORDER — NEPRO/CARBSTEADY PO LIQD
237.0000 mL | Freq: Two times a day (BID) | ORAL | Status: DC
  Administered 2022-06-10 – 2022-06-21 (×18): 237 mL via ORAL

## 2022-06-09 MED ORDER — LIDOCAINE HCL (PF) 1 % IJ SOLN
5.0000 mL | Freq: Once | INTRAMUSCULAR | Status: DC
  Filled 2022-06-09: qty 5

## 2022-06-09 MED ORDER — LIDOCAINE HCL 1 % IJ SOLN
5.0000 mL | Freq: Once | INTRAMUSCULAR | Status: DC
  Filled 2022-06-09: qty 5

## 2022-06-09 MED ORDER — TRIAMCINOLONE ACETONIDE 40 MG/ML IJ SUSP
40.0000 mg | Freq: Once | INTRAMUSCULAR | Status: DC
  Filled 2022-06-09: qty 1
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
## 2022-06-09 NOTE — Progress Notes (Signed)
Physical Therapy Session Note  Patient Details  Name: Gabriela [name] MRN: [id number] Date of Birth: Feb 14, 1926  Today's Date: 06/09/2022 PT Individual Time: 0932-3557 and 1500 - 1540 PT Individual Time Calculation (min): 42 min and 40 min  Short Term Goals: Week 1:  PT Short Term Goal 1 (Week 1): Pt will transfer to R w/ min A consistently. PT Short Term Goal 2 (Week 1): Pt will transfer sit to stand w/ mod A. PT Short Term Goal 3 (Week 1): Pt will transfer w/c <> bed w/ min A and SB. PT Short Term Goal 4 (Week 1): PT will assess gait.  Skilled Therapeutic Interventions/Progress Updates: Pt presented in bed agreeable to therapy. Pt states minimal pain this am (did not rate), minimal pain behaviors noted with rest breaks provided as needed during session. Pt noted to have purewick in place. Removed purewick with pt rolling L/R with modA to change brief, and clean skin. PTA donned brief and threaded pants total A with pt performing mini-rolls to allow PTA to pull pants over buttocks. Pt then performed supine to sit with modA, increased time and use of bed features. Pt donned shirt with set up after using washcloth to clean upper body. PTA providing education regarding importance of increasing wt bearing to promote healing. Pt practiced leaning forward towards PTA to increase wt bearing through BLE. Pt was then total A for Slide board set up and performed Slide board transfer to w/c to R with heavy modA initially then improved to minA with increased time and effort. Cues provided to improve anterior lean to offload buttocks. Pt left in w/c at end of session with belt alarm on, call bell within reach and needs met.   Tx2: Pt presented in w/c with family present agreeable to therapy. Pt states pain controlled and spent several minutes answering questions from granddaughter, regarding healing, current LOF, and anticipated progression. Pt then transported to rehab gym for time management. Pt participated  in x 4 partial stands with pt pulling on parallel bars and pt able to clear hips from w/c with modA from PTA. PTA facilitated anterior weight shifting for quad activation. Pt was able to maintain this position for 3-5 seconds on each attempt. Pt was allowed increased time between bouts due to increase in pain with wt bearing but was able to recover with seated rest. Once task completed pt was able to perform small scoots alternating sides to scoot posteriorly in w/c. On last 3-4 scoots pt able to demonstrate increased push through LE and demonstrate more efficient scoot. Pt transported back to room at end of session and remained in w/c with belt alarm on, call bell within reach and needs met.      Therapy Documentation Precautions:  Precautions Precautions: Fall Precaution Comments: HOH Restrictions Weight Bearing Restrictions: Yes RLE Weight Bearing: Weight bearing as tolerated LLE Weight Bearing: Weight bearing as tolerated Other Position/Activity Restrictions: Transfers only General:   Vital Signs:  Pain:   Mobility:   Locomotion :    Trunk/Postural Assessment :    Balance:   Exercises:   Other Treatments:      Therapy/Group: Individual Therapy  Janaisa Birkland 06/09/2022, 8:49 AM

## 2022-06-09 NOTE — Plan of Care (Signed)
Goals DC as it is unexpected that patient will be able to functionally stand prior to DC due to slower than anticipated progress, pain in BLE, and fear of weight bearing through BLE.  Problem: RH Balance Goal: LTG Patient will maintain dynamic standing balance (PT) Description: LTG:  Patient will maintain dynamic standing balance with assistance during mobility activities (PT) Outcome: Not Applicable Note: DC goal - pt will unable to tolerate standing to progress dynamic standing balance.    Problem: Sit to Stand Goal: LTG:  Patient will perform sit to stand with assistance level (PT) Description: LTG:  Patient will perform sit to stand with assistance level (PT) Outcome: Not Applicable Note: DC goal - pt will complete modified bed<>chair transfers, unable to tolerate standing due to fear, pain.   Problem: RH Furniture Transfers Goal: LTG Patient will perform furniture transfers w/assist (OT/PT) Description: LTG: Patient will perform furniture transfers  with assistance (OT/PT). Outcome: Not Applicable Note: DC goal for safety concerns. Will be wheelchair level at DC.    Goals downgraded due to slower than anticipated progress, pain, and fear of mobility.  Problem: RH Balance Goal: LTG Patient will maintain dynamic sitting balance (PT) Description: LTG:  Patient will maintain dynamic sitting balance with assistance during mobility activities (PT) Flowsheets (Taken 06/09/2022 0740) LTG: Pt will maintain dynamic sitting balance during mobility activities with:: Minimal Assistance - Patient > 75%   Problem: RH Bed Mobility Goal: LTG Patient will perform bed mobility with assist (PT) Description: LTG: Patient will perform bed mobility with assistance, with/without cues (PT). Flowsheets (Taken 06/09/2022 0740) LTG: Pt will perform bed mobility with assistance level of: Minimal Assistance - Patient > 75%   Problem: RH Bed to Chair Transfers Goal: LTG Patient will perform bed/chair  transfers w/assist (PT) Description: LTG: Patient will perform bed to chair transfers with assistance (PT). Flowsheets (Taken 06/09/2022 0740) LTG: Pt will perform Bed to Chair Transfers with assistance level: Minimal Assistance - Patient > 75%   Problem: RH Car Transfers Goal: LTG Patient will perform car transfers with assist (PT) Description: LTG: Patient will perform car transfers with assistance (PT). Flowsheets (Taken 06/09/2022 0740) LTG: Pt will perform car transfers with assist:: Minimal Assistance - Patient > 75%

## 2022-06-09 NOTE — Progress Notes (Signed)
Physical Therapy Session Note  Patient Details  Name: Gabriela [name] MRN: [id number] Date of Birth: 1926-01-26  Today's Date: 06/09/2022 PT Individual Time: 0915-1012 PT Individual Time Calculation (min): 57 min   Short Term Goals: Week 1:  PT Short Term Goal 1 (Week 1): Pt will transfer to R w/ min A consistently. PT Short Term Goal 2 (Week 1): Pt will transfer sit to stand w/ mod A. PT Short Term Goal 3 (Week 1): Pt will transfer w/c <> bed w/ min A and SB. PT Short Term Goal 4 (Week 1): PT will assess gait.  Skilled Therapeutic Interventions/Progress Updates:      Notified by MD prior to arrival that patient's granddaughter is requesting that patient be able to stand and transfer using the RW - reports that the hired caregiver is unfamiliar with sliding board.   Anticipate that patient will require sliding board for safety and that it would be beneficial for caregiver to come for training prior to DC.   Pt sitting in w/c on arrival - agreeable to PT tx. Reports 6/10 LLE pain - rest breaks and repositioning provided for pain management.  Transported in w/c to main rehab gym and wheeled inside // bars to focus on standing trials.  Required +2 assist with maxA to safely stand in // bars - trunk is very flexeed, knees blocked using pad for comfort, and hip significantly flexed. Unable to achieve full upright despite +2 assist. Pt fearful and pain in L knee inhibiting full upright. Attempted x3 trials before deferring 2/2 safety concerns.  Returned to mat table and continued to focus on SB transfers.  Completed blocked practiced SB transfers ranging in level of assist - minA to [employer]. TotalA needed for board placement. Once she's able to navigate over the lip of the SB with modA, she can transfer along board with CGA/minA. Transfers completed at level surfaces. Assist for forward weight shifting and for feet placement to promote adequate BOS. Pt requires ++ time to complete  transfers.  Pt returned to her room - remained seated in w/c with safety belt alarm on, puzzle book in reach, call bell in lap, all needs met.   Therapy Documentation Precautions:  Precautions Precautions: Fall Precaution Comments: HOH Restrictions Weight Bearing Restrictions: Yes RLE Weight Bearing: Weight bearing as tolerated LLE Weight Bearing: Weight bearing as tolerated Other Position/Activity Restrictions: Transfers only General:    Therapy/Group: Individual Therapy  Alger Simons 06/09/2022, 7:44 AM

## 2022-06-09 NOTE — Progress Notes (Signed)
Occupational Therapy Session Note  Patient Details  Name: Gabriela [name] MRN: [id number] Date of Birth: [birth year]  Today's Date: 06/09/2022 OT Individual Time: 1330-1430 OT Individual Time Calculation (min): 60 min    Short Term Goals: Week 1:  OT Short Term Goal 1 (Week 1): Pt will complete 1/3 toileting steps with CGA OT Short Term Goal 2 (Week 1): Pt will complete toilet transfer with Mod A in prep for OOB toileting OT Short Term Goal 3 (Week 1): Pt will use AE PRN to thread pants with supervision for balance OT Short Term Goal 4 (Week 1): Pt will sit > stand in prep for ADL with mod A using LRAD  Skilled Therapeutic Interventions/Progress Updates:     Pt received in room resting in wc with family members (grand DTR, son, and grand DTR) present in good spirits and receptive to participating in skilled OT session. Session was focused on family education, d/c planning, and functional transfers. OT introduced self to Pt and family and provided skilled education on purpose of OT services. Pt's granddaughter nervous to have different OT working with the Pt today and providing extensive details on the Pt's home set-up and H&P to therapist at beginning of session. Pt's granddaughter reported that she desires to have Pt stand and turn for transfers using RW as support as it could decrease caregiver burden. Pt reported 5/10 pain in LLE stating she cannot stand on her knees at this time. Family education provided on safe transfer options with emphasis on using sliding board as a safer transfer method for Pt at this time.  Pt propelled self halfway to therapy gym with increased time. Pt transferred to EOM using sliding board with mod A and increased time to complete task. Pt required total A for board placement and stabilization during transfer. Pt then practiced anterior weight shifting and lateral scoots to practice skills needed for sliding board transfers.  Pt participated in dynamic sitting task  reach outside of her BOS to reach for game cards. Pt then reached shoulder level to place card onto horizontal board with matching card. Pt required CGA to maintain dynamic seated balance and min A to rotate trunk to locate cards. Pt transferred back to wc using sliding board in same manner. Pt began to propel self back to room with transition to total A from therapist d/t fatigue.  Pt provided with bilateral ice packs following session to help decrease pain. Pt was left resting in wc with posey belt on, call bell in reach, family members present, and all needs met.  Therapy Documentation Precautions:  Precautions Precautions: Fall Precaution Comments: HOH Restrictions Weight Bearing Restrictions: Yes RLE Weight Bearing: Weight bearing as tolerated LLE Weight Bearing: Weight bearing as tolerated Other Position/Activity Restrictions: Transfers only General:   Vital Signs: Therapy Vitals Temp: 97.8 F (36.6 C) Temp Source: Oral Pulse Rate: 79 Resp: 19 BP: (Abnormal) 154/62 Patient Position (if appropriate): Sitting Oxygen Therapy SpO2: 100 % O2 Device: Room Air Pain: Pain Assessment Pain Scale: 0-10 Pain Score: 6  Pain Type: Acute pain Pain Location: Knee ADL: ADL Eating: Independent Where Assessed-Eating: Edge of bed Grooming: Supervision/safety Where Assessed-Grooming: Sitting at sink Upper Body Bathing: Supervision/safety Where Assessed-Upper Body Bathing: Sitting at sink Lower Body Bathing: Dependent (x2 assist using stedy to stand and wash buttocks) Where Assessed-Lower Body Bathing: Standing at sink, Other (Comment) (stedy) Upper Body Dressing: Minimal assistance (to pull over head) Where Assessed-Upper Body Dressing: Wheelchair Lower Body Dressing: Dependent (able to thread L  LE into brief with reacher only. Total A for pants management and 2 people using stedy) Where Assessed-Lower Body Dressing: Standing at sink, Other (Comment) (stedy) Toileting: Dependent  (purewick) Where Assessed-Toileting: Bed level Toilet Transfer: Unable to assess Toilet Transfer Method: Unable to assess Tub/Shower Transfer: Unable to assess Tub/Shower Transfer Method: Unable to assess Gaffer Transfer: Unable to assess [employer] Transfer Method: Unable to assess    Therapy/Group: Individual Therapy  Janey Genta 06/09/2022, 4:08 PM

## 2022-06-09 NOTE — Progress Notes (Signed)
Discussed patient's MRI with Dr. Adam Phenix who recommended steriod injection to left knee to help manage symptoms. Reached out to surgeon and spoke with Ainsley Spinner, [facility] who felt that pain likely post op in nature but can try steroid shot if we feel that it is of benefit.

## 2022-06-10 NOTE — Progress Notes (Signed)
Occupational Therapy Weekly Progress Note  Patient Details  Name: Gabriela [name] MRN: [id number] Date of Birth: [birth year]  Beginning of progress report period: June 03, 2022 End of progress report period: June 10, 2022  Patient has met 1 of 4 short term goals. Pt is making slow progress towards LTGs. Pt can be at anywhere from min-max A transfer level with slide board due to fatigue/pain/type of surface. She is able to bathe at an overall mod A level at EOB/bed level, dress with mod A at bed level and requires mod-max assist for toileting tasks at bed level. Pt continues to demonstrate high pain and fear levels with mobility, low activity tolerance and generalized strength deficits (though demos great effort with activities asked of her), resulting in difficulty completing BADL tasks without increased physical assist. Pt will benefit from continued skilled OT services to focus on mentioned deficits, as well as for OT to provide family (granddaughter) and caregiver education prior to discharge.  Patient continues to demonstrate the following deficits: muscle weakness and muscle joint tightness, decreased cardiorespiratoy endurance, decreased coordination, decreased problem solving and decreased memory, and decreased sitting balance and decreased standing balance and therefore will continue to benefit from skilled OT intervention to enhance overall performance with BADL and Reduce care partner burden.  Patient not progressing toward long term goals.  See goal revision..  Plan of care revisions: Downgraded to overall min A.  OT Short Term Goals Week 1:  OT Short Term Goal 1 (Week 1): Pt will complete 1/3 toileting steps with CGA OT Short Term Goal 1 - Progress (Week 1): Met OT Short Term Goal 2 (Week 1): Pt will complete toilet transfer with Mod A in prep for OOB toileting OT Short Term Goal 2 - Progress (Week 1): Not met OT Short Term Goal 3 (Week 1): Pt will use AE PRN to thread pants with  supervision for balance OT Short Term Goal 3 - Progress (Week 1): Not met OT Short Term Goal 4 (Week 1): Pt will sit > stand in prep for ADL with mod A using LRAD OT Short Term Goal 4 - Progress (Week 1): Not met Week 2:  OT Short Term Goal 1 (Week 2): STG = LTG due to [facility], MS, OTR/L  06/10/2022, 3:38 PM

## 2022-06-10 NOTE — Progress Notes (Signed)
Occupational Therapy Session Note  Patient Details  Name: Gabriela [name] MRN: [id number] Date of Birth: 02-07-1926  Session 1 Today's Date: 06/10/2022 OT Individual Time: 4742-5956 OT Individual Time Calculation (min): 69 min    Session 2 Today's Date: 06/10/2022 OT Individual Time: 3875-6433 OT Individual Time Calculation (min): 43 min    Short Term Goals: Week 1:  OT Short Term Goal 1 (Week 1): Pt will complete 1/3 toileting steps with CGA OT Short Term Goal 2 (Week 1): Pt will complete toilet transfer with Mod A in prep for OOB toileting OT Short Term Goal 3 (Week 1): Pt will use AE PRN to thread pants with supervision for balance OT Short Term Goal 4 (Week 1): Pt will sit > stand in prep for ADL with mod A using LRAD  Skilled Therapeutic Interventions/Progress Updates:    Session 1 Pt received siting up in the recliner with no c/o pain at rest, agreeable to OT session focused on bathing/dressing retraining. She completed UB bathing with set up assist and donned a new shirt with (S). Attempted to stand x2 at the sink with max-total A but pt stating "no no, I can't do this right now". Pt agreeable to slideboard back to bed for supine LB bathing and dressing. She required mod A overall for the slideboard with increased time provided for problem solving. She sat Eob for several minutes to "get ready" and for scooting backward. She required max A to return to supine, including management of both her trunk and BLE. She completed rolling R and L with CGA for instruction on bed level dressing. She was able to bring pants down and required max A for management over her feet. Pt unaware of urinary incontinence of brief. She completed peri hygiene in sidelying with close (S). Changed out the sacral foam pad to ensure area clean and dry. Pt required mod A to get pants back on and over hips. She required increased time for pain management and processing. She declined getting OOB stating she needed  to rest her knee. She was agreeable to bed level BUE strengthening circuit. Pt completes 2x10-15 dowel rod therex for BUE shoulder strengthening required for BADLs/functional transfers as follows with demo cuing and 2 # dowel rod- completing shoulder flex/ext, shoulder press and chest press. Pt was left supine with all needs met, call bell within reach.     Session 2 Pt received supine with no c/o pain, agreeable to OT session. She required increased time for bed mobility to EOB with mod cueing for body mechanics provided by OT. She required min A to elevate trunk. She was able to maintain sitting balance with (S) for several minutes while OT donned shoes. She completed a slideboard transfer from bed to w/c with mod A. She required assist especially for scooting hips while in the w/c. She was taken to the therapy gym for continued slideboard practice as pt reporting her knees hurt too bad to stand. She completed slideboard <> mat with mod A overall and heavy focus/cueing for head-hips relationship and bringing head forward. She was then taken outside to get fresh air to improve self efficacy and mood. Discussed adjustment to hospitalization and d/c planning. Pt returned inside. Pt was left sitting up in the w/c with all needs met, chair alarm set, and call bell within reach.      Therapy Documentation Precautions:  Precautions Precautions: Fall Precaution Comments: HOH Restrictions Weight Bearing Restrictions: Yes RLE Weight Bearing: Weight bearing as tolerated LLE  Weight Bearing: Weight bearing as tolerated Other Position/Activity Restrictions: Transfers only  Therapy/Group: Individual Therapy  Curtis Sites 06/10/2022, 6:30 AM

## 2022-06-10 NOTE — Evaluation (Signed)
Recreational Therapy Assessment and Plan  Patient Details  Name: Gabriela [name] MRN: [id number] Date of Birth: 11/12/1925 Today's Date: 06/10/2022  Rehab Potential:  Good ELOS:   d/c 11/04  Assessment  Hospital Problem: Principal Problem:   Oth fracture of unsp femur, init encntr for closed fracture [facility])     Past Medical History:      Past Medical History:  Diagnosis Date   Arthritis     Diabetes mellitus ([place])     Glaucoma     Hyperlipidemia     Hypertension     Shortness of breath      with exertion    Past Surgical History:       Past Surgical History:  Procedure Laterality Date   CARDIAC CATHETERIZATION   04/22/13   CHOLECYSTECTOMY       CORONARY ARTERY BYPASS GRAFT N/A 05/31/2013    Procedure: Coronary Artery Bypass Grafting Times Three Using Left Internal Mammary Artery and Right Saphenous Leg Vein Harvested Endoscopically;  Surgeon: Ivin Poot, MD;  Location: [place];  Service: Open Heart Surgery;  Laterality: N/A;   ERCP N/A 10/20/2020    Procedure: ENDOSCOPIC RETROGRADE CHOLANGIOPANCREATOGRAPHY (ERCP);  Surgeon: Lucilla Lame, MD;  Location: [facility] ENDOSCOPY;  Service: Endoscopy;  Laterality: N/A;   ERCP N/A 12/22/2020    Procedure: ENDOSCOPIC RETROGRADE CHOLANGIOPANCREATOGRAPHY (ERCP);  Surgeon: Lucilla Lame, MD;  Location: [facility] ENDOSCOPY;  Service: Endoscopy;  Laterality: N/A;   EYE SURGERY       HIP SURGERY        right   INTRAOPERATIVE TRANSESOPHAGEAL ECHOCARDIOGRAM N/A 05/31/2013    Procedure: INTRAOPERATIVE TRANSESOPHAGEAL ECHOCARDIOGRAM;  Surgeon: Ivin Poot, MD;  Location: [place];  Service: Open Heart Surgery;  Laterality: N/A;   JOINT REPLACEMENT       MITRAL VALVE REPAIR N/A 05/31/2013    Procedure: MITRAL VALVE REPAIR (MVR);  Surgeon: Ivin Poot, MD;  Location: [place];  Service: Open Heart Surgery;  Laterality: N/A;   ORIF FEMUR FRACTURE Bilateral 05/26/2022    Procedure: OPEN REDUCTION INTERNAL FIXATION (ORIF) DISTAL FEMUR FRACTURE;  Surgeon:  Altamese Satartia, MD;  Location: [place];  Service: Orthopedics;  Laterality: Bilateral;   TONSILLECTOMY          Assessment & Plan Clinical Impression: Gabriela [name] is a 88 year old female with history of T2DM with nephropathy (has decline nephrology follow up), HTN, MGUS (Dr. Talbert Cage), chronic RLE wound with recent admission for metabolic encephalopathy and pseudomonas wound infection treated with cipro X 6 weeks per 09/01 admission.  She was discharged to home on 09/07 with [facility] therapy and was making good progress. She was walking with PT without gait belt when her legs gave out, she fell to the floor with onset of LLE >RLE pain and inability to walk. She was found to have right distal femur Fx and left comminuted distal femur fracture with angulation and intra-articular extension into later femoral condyle. She underwent ORIF right and left supracondylar Fractures by Dr. Marcelino Scot on 05/26/22. Post op to be WBAT and continue low dose ASA. Sutures to stay in place 2 weeks with follow up X rays at that time.    She had rise in WBC to 21.2 and Dr. Drucilla Schmidt consulted for input on chronic right thigh wound and concerns of it seeding right THR prosthesis. CT right hip done revealing post surgical changes with chronic osteolysis around acetabular cup and chronic fluid collections contained in right iliopsoas bursa 10 X 4.2 X 5.0  cm (minimally increased c/w 09/01 admission).  As thigh wound almost closed with minimal drainage, decision made not to aspirate hip, monitor off antibioticus and watchful waiting as safer option. Leucocytosis has resolved, she has been afebrile and pain control improving. PT/OT has been working with patient who continues to be limited by pain, weakness and fatigue. CIR recommended due to functional decline.   Pt presents with decreased activity tolerance, decreased functional mobility, decreased balance Limiting pt's independence with leisure/community pursuits.  Met with pt today to discuss  TR services.  Pt shared that she enjoyed working in her puzzle books and cooking from time to time.  Pt smiling and laughing during conversation when sharing cooking tips and preferred foods.  Pt does have a puzzle book from home in her room for use PRN.   CTRS/LRT encouraged deep breathing techniques during sit-stands and stretching exercises with PT to promote relaxation as pt anticipates pain.  Pt demonstrated appropriate breathing technique to promote relaxation with sit-stand with min instructional cues.  Pt found deep breathing instruction helpful.    Pt returned to the room with PT and provided puzzle book at the end of the session.  Plan  Min 1 TR session per week during LOS  Recommendations for other services: None   Discharge Criteria: Patient will be discharged from TR if patient refuses treatment 3 consecutive times without medical reason.  If treatment goals not met, if there is a change in medical status, if patient makes no progress towards goals or if patient is discharged from hospital.  The above assessment, treatment plan, treatment alternatives and goals were discussed and mutually agreed upon: by patient  [place] 06/10/2022, 9:22 AM

## 2022-06-10 NOTE — Progress Notes (Signed)
Physical Therapy Weekly Progress Note  Patient Details  Name: Gabriela [name] MRN: [id number] Date of Birth: 02-Mar-1926  Beginning of progress report period: June 03, 2022 End of progress report period: June 10, 2022  Today's Date: 06/10/2022 PT Individual Time: 1017-5102 + 1131-1157 PT Individual Time Calculation (min): 74 min + 26 min  Patient has met 0 of 4 short term goals.  Pt is making slower than anticipated progress towards LTG. LTG downgraded to minA overall at wheelchair level. Patient requires mod to maxA for bed mobility using hospital bed features, supervision for unsupported sitting balance, and using sliding board for transfers. Sliding board transfers vary in assistance from minA to [place] depending on surface height/pain/fatigue, however these are progressing. Have recommended hip abduction pillow for bed to promote positioning. Limited progress has been made with sit<>stands and stand<>pivot transfers.   Patient continues to demonstrate the following deficits muscle weakness and muscle joint tightness, decreased cardiorespiratoy endurance, and decreased sitting balance, decreased standing balance, and decreased balance strategies and therefore will continue to benefit from skilled PT intervention to increase functional independence with mobility.  Patient progressing toward long term goals..  Continue plan of care.  PT Short Term Goals Week 1:  PT Short Term Goal 1 (Week 1): Pt will transfer to R w/ min A consistently. PT Short Term Goal 1 - Progress (Week 1): Not met PT Short Term Goal 2 (Week 1): Pt will transfer sit to stand w/ mod A. PT Short Term Goal 2 - Progress (Week 1): Not met PT Short Term Goal 3 (Week 1): Pt will transfer w/c <> bed w/ min A and SB. PT Short Term Goal 3 - Progress (Week 1): Not met PT Short Term Goal 4 (Week 1): PT will assess gait. PT Short Term Goal 4 - Progress (Week 1): Not met Week 2:  PT Short Term Goal 1 (Week 2): STG = LTG due to  ELOS  Skilled Therapeutic Interventions/Progress Updates:      1st session: Pt in bed with nusing at bedside removing the hip abduction pillow. Pt agreeable to PT tx. Removed female purwick and assisted with bed level dressing. maxA needed for LB dressing at bed level by rolling in bed (unable to bridge on air mattress). Supine<>sitting EOB with modA for trunk support - pt with improved initiation of BLE off EOB. Requires maxA for forward scooting to EOB with chuck pad to assist with hips. Able to sit EOB with SBA while completes UB dressing with setupA. Sliding board transfer towards her R side with totalA for board placement and modA for hip translation across board. Assist for repostioning feet during transfer to promote adequate BOS and for forward weight shifting of her trunk to improve efficiency.   Transported to main rehab gym for time management.   Completed active warm up of LAQ + hip marches, 2x20 bilaterally - improved tolerance on L > R.  Setup in wheelchair outside // bars facing large mirror. Placed coban tape along // bar to improve grip and comfort. Used linen sheet behind hips and placed blue air-ex pad b/w PT/patient's knees for comfort during blocking.  -Pt rising to stand with maxA with PT pulling on linen sheet and PT pulling from // bar. Pt able to achieve more upright posture compared to yesterday but remains significantly flexed. Standing tolerance ~5 seconds  up to 90 seconds (!!!) per stand 2/2 pain > fatigue. Completed x8 stands in total with prolonged rest breaks b/w efforts.  Stretching of BLE to promote knee extension for hamstring stretching while patient sat in w/c. Cues for quad sets during stretching to promote active ROM.  Returned to her room and she remained seated in w/c with safety belt alarm on, call bell in reach, all needs met.  2nd session: Pt resting in bed on arrival - awakens to voice and is agreeable to PT tx. Reports unrated L>R knee pain. Rest  breaks, stretching, and exercises for pain management. Hip abduction in place - removed for there-ex and reapplied at end of session.  Supine there-ex as follows, bilaterally: -1x10 hip abd/add (AAROM R > L) -1x10 heel slides (AAROM R > L) -1x10 glut sets -1x10 SAQ -1x10 quat sets  Concluded session in bed, all needs met.   Therapy Documentation Precautions:  Precautions Precautions: Fall Precaution Comments: HOH Restrictions Weight Bearing Restrictions: Yes RLE Weight Bearing: Weight bearing as tolerated LLE Weight Bearing: Weight bearing as tolerated Other Position/Activity Restrictions: Transfers only General:     Therapy/Group: Individual Therapy  Gabriela [name] Gabriela [name] PT 06/10/2022, 7:42 AM
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
## 2022-06-10 NOTE — Progress Notes (Signed)
Physical Therapy Session Note  Patient Details  Name: Gabriela [name] MRN: [id number] Date of Birth: 1926/04/18  Today's Date: 06/10/2022 PT Individual Time: 7921-7837 PT Individual Time Calculation (min): 34 min   Short Term Goals: Week 1:  PT Short Term Goal 1 (Week 1): Pt will transfer to R w/ min A consistently. PT Short Term Goal 1 - Progress (Week 1): Not met PT Short Term Goal 2 (Week 1): Pt will transfer sit to stand w/ mod A. PT Short Term Goal 2 - Progress (Week 1): Not met PT Short Term Goal 3 (Week 1): Pt will transfer w/c <> bed w/ min A and SB. PT Short Term Goal 3 - Progress (Week 1): Not met PT Short Term Goal 4 (Week 1): PT will assess gait. PT Short Term Goal 4 - Progress (Week 1): Not met  Skilled Therapeutic Interventions/Progress Updates: Pt presented in w/c agreeable to therapy. Pt c/o L knee pain, did not rate with rest breaks provided with wt bearing activities for pain management. Pt requesting to return to bed therefore session focused on Slide board transfer and supine therex. Pt's w/c set up to transfer to L. Pt required total A for Slide board set up and required modA for Slide board transfer to bed. Pt noted to perform a significant amount of small scoots with PTA facilitating anterior trunk lean facilitating via Bobath method. With facilitation pt was able to perform more efficient scoots. PTA removed Slide board once on bed and pt was able to perform several scoots posteriorly with improved technique. Pt required modA for sit to supine. In supine pt performed AA heel slides, hip abd/add, AA SLR, SAQ, and modified bridges from bolster x 10 each (bilaterally where applicable). Pt left in bed at end of session with call bell within reach and current needs met.      Therapy Documentation Precautions:  Precautions Precautions: Fall Precaution Comments: HOH Restrictions Weight Bearing Restrictions: Yes RLE Weight Bearing: Weight bearing as tolerated LLE Weight  Bearing: Weight bearing as tolerated Other Position/Activity Restrictions: Transfers only General:   Vital Signs: Therapy Vitals Temp: 97.7 F (36.5 C) Temp Source: Oral Pulse Rate: 71 Resp: 16 BP: (!) 155/69 Patient Position (if appropriate): Lying Oxygen Therapy SpO2: 99 % O2 Device: Room Air Pain: Pain Assessment Pain Scale: 0-10 Pain Score: 6  Pain Type: Acute pain Pain Location: Knee Pain Orientation: Left;Right Pain Intervention(s): Medication (See eMAR) (pain med, volteran gel) Mobility:   Locomotion :    Trunk/Postural Assessment :    Balance:   Exercises:   Other Treatments:      Therapy/Group: Individual Therapy  Talib Headley 06/10/2022, 3:50 PM

## 2022-06-11 ENCOUNTER — Inpatient Hospital Stay (HOSPITAL_COMMUNITY): Payer: Medicare Other

## 2022-06-11 LAB — OCCULT BLOOD X 1 CARD TO LAB, STOOL: Fecal Occult Bld: NEGATIVE

## 2022-06-11 LAB — D-DIMER, QUANTITATIVE: D-Dimer, Quant: 5.45 ug/mL-FEU — ABNORMAL HIGH (ref 0.00–0.50)

## 2022-06-11 MED ORDER — HYDRALAZINE HCL 10 MG PO TABS
10.0000 mg | ORAL_TABLET | Freq: Once | ORAL | Status: AC
  Administered 2022-06-11: 10 mg via ORAL
  Filled 2022-06-11: qty 1

## 2022-06-11 NOTE — Progress Notes (Signed)
Notified Dr. Letta Pate, on call provider about patient BP and sob. Prn breathing treatment given. Orders initiated as ordered. O2 sat  94 on  RA. Patient voiced some relief. Will continue to  monitor

## 2022-06-11 NOTE — Progress Notes (Signed)
Occupational Therapy Note  Patient Details  Name: Gabriela [name] MRN: [id number] Date of Birth: 1925/11/01  Today's Date: 06/11/2022 OT Missed Time: 37 Minutes Missed Time Reason: Patient fatigue  Pt asking to rest today due to fatigue.    Britiany Silbernagel N 06/11/2022, 9:45 AM

## 2022-06-11 NOTE — Progress Notes (Signed)
Called by LPN regarding elevated HR, BP,RR.  Pt had O2 sat of 96%.  Pt in pain at that time due to bilateral femur fx. CXR with cardiomegaly that looked improved compared to prior film performed preop.  No signs of pulm edema.  Hilar prominence unchanged.  D dimer  elevated but pt is post op major orthopedic surgery.   EKG with NSR, T wave inversions, had similar T wave abnormalities 04/04/21.   Pt reported pain relief to LPN, HR, RR and BP improved.  No further w/u at this time

## 2022-06-11 NOTE — Progress Notes (Signed)
Notified MD that EKG and CXR 1 view completed and upload into patients chart for his review.

## 2022-06-12 MED ORDER — METOPROLOL TARTRATE 25 MG/10 ML ORAL SUSPENSION
6.2500 mg | Freq: Two times a day (BID) | ORAL | Status: DC
  Administered 2022-06-12 – 2022-06-14 (×4): 6.25 mg via ORAL
  Filled 2022-06-12 (×6): qty 5

## 2022-06-12 NOTE — Consult Note (Signed)
[facility] Nurse wound follow up Wound type:DTPI to right lateral heel Measurement: 2.5cm x 3cm area of purple discoloration Wound bed:N/A Drainage (amount, consistency, odor) NA Periwound:intact, dry Dressing procedure/placement/frequency: Patient is not wearing Prevalon boot as an abductor pillow/wedge is in place.  Heels must be floated however, and I discuss with Bedside RN.  Silicone foam is in place and wound contact layer is a xeroform gauze. Foot is now rotating medially.  No skin damage to medial heel.  [place] [place] nursing team will follow, seeing every 7-10 days and will remain available to this patient, the nursing and medical teams.    Thank you for inviting [place] to participate in this patient's Plan of Care.  Maudie Flakes, MSN, RN, CNS, [place], Serita Grammes, [employer], [employer] phone:  [phone]

## 2022-06-12 NOTE — Progress Notes (Signed)
Physical Therapy Session Note  Patient Details  Name: Gabriela [name] MRN: [id number] Date of Birth: 03-10-1926  Today's Date: 06/12/2022 PT Individual Time: 1507-1540 PT Individual Time Calculation (min): 33 min   Short Term Goals: Week 2:  PT Short Term Goal 1 (Week 2): STG = LTG due to ELOS  Skilled Therapeutic Interventions/Progress Updates:     Patient in bed upon PT arrival. Patient alert and agreeable to PT session. Patient reported 3-6/10 B lower extremity pain during session, RN made aware. PT provided repositioning, rest breaks, and distraction as pain interventions throughout session.   Therapeutic Activity: Bed Mobility: Patient performed supine to/from sit with mod-max A for trunk and lower extremity management with use of hospital bed features. Provided verbal cues for sequencing, and use of upper extremities to push up to sitting. Patient sat EOB to doff gown and don shirt with set-up assist, don pant with max-total A due to lower extremity pain. Patient then doffed clothing and donned a new gown, as above, before returning to lying at end of session.  Transfers: Patient performed slide board transfer bed<>w/c with mod-max A and total A for board placement. Provided cues for hand placement, board placement, and head-hips relationship for proper technique and decreased assist with transfers.   Patient performed AAROM LAQ, seated marching, and hip abd/add. Bed linens changed during exercises. Encouraged patient to sit OOB, however patient reports increased pain and fatigue in sitting. Patient returned to bed as above.   Patient in bed with adductor wedge in place to reduced hip adduction at end of session with breaks locked, bed alarm set, 4 rails up per patient request, and all needs within reach.   Therapy Documentation Precautions:  Precautions Precautions: Fall Precaution Comments: HOH Restrictions Weight Bearing Restrictions: Yes RLE Weight Bearing: Weight bearing  as tolerated LLE Weight Bearing: Weight bearing as tolerated Other Position/Activity Restrictions: Transfers only    Therapy/Group: Individual Therapy  Camani Sesay L Gloris Shiroma PT, DPT, NCS, CBIS  06/12/2022, 7:25 PM
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
## 2022-06-13 DIAGNOSIS — N189 Chronic kidney disease, unspecified: Secondary | ICD-10-CM

## 2022-06-13 DIAGNOSIS — D696 Thrombocytopenia, unspecified: Secondary | ICD-10-CM

## 2022-06-13 DIAGNOSIS — D631 Anemia in chronic kidney disease: Secondary | ICD-10-CM

## 2022-06-13 DIAGNOSIS — T148XXA Other injury of unspecified body region, initial encounter: Secondary | ICD-10-CM

## 2022-06-13 LAB — BASIC METABOLIC PANEL
Anion gap: 7 (ref 5–15)
BUN: 29 mg/dL — ABNORMAL HIGH (ref 8–23)
CO2: 20 mmol/L — ABNORMAL LOW (ref 22–32)
Calcium: 8.6 mg/dL — ABNORMAL LOW (ref 8.9–10.3)
Chloride: 113 mmol/L — ABNORMAL HIGH (ref 98–111)
Creatinine, Ser: 1.18 mg/dL — ABNORMAL HIGH (ref 0.44–1.00)
GFR, Estimated: 43 mL/min — ABNORMAL LOW (ref >60)
Glucose, Bld: 98 mg/dL (ref 70–99)
Potassium: 4.5 mmol/L (ref 3.5–5.1)
Sodium: 140 mmol/L (ref 135–145)

## 2022-06-13 LAB — CBC
HCT: 25.6 % — ABNORMAL LOW (ref 36.0–46.0)
Hemoglobin: 8.2 g/dL — ABNORMAL LOW (ref 12.0–15.0)
MCH: 27.4 pg (ref 26.0–34.0)
MCHC: 32 g/dL (ref 30.0–36.0)
MCV: 85.6 fL (ref 80.0–100.0)
Platelets: 76 10*3/uL — ABNORMAL LOW (ref 150–400)
RBC: 2.99 MIL/uL — ABNORMAL LOW (ref 3.87–5.11)
RDW: 19.6 % — ABNORMAL HIGH (ref 11.5–15.5)
WBC: 5.4 10*3/uL (ref 4.0–10.5)
nRBC: 0 % (ref 0.0–0.2)

## 2022-06-13 LAB — GLUCOSE, CAPILLARY: Glucose-Capillary: 81 mg/dL (ref 70–99)

## 2022-06-13 NOTE — Progress Notes (Signed)
Occupational Therapy Session Note  Patient Details  Name: Gabriela [name] MRN: [id number] Date of Birth: 1926-01-04  Session 1 Today's Date: 06/13/2022 OT Individual Time: 0848-1000 OT Individual Time Calculation (min): 72 min   Session 2 Today's Date: 06/13/2022 OT Individual Time: 2641-5830 OT Individual Time Calculation (min): 52 min    Short Term Goals: Week 2:  OT Short Term Goal 1 (Week 2): STG = LTG due to ELOS  Skilled Therapeutic Interventions/Progress Updates:    Session 1 Pt received sitting EOB with no c/o pain, agreeable to OT session. She reported pain increase to 7/10 with all activity- adequate rest breaks and pacing provided for pain management. She stood from elevated EOB with max A in the stedy. Very poor glute activation. She was able to remain perched in the stedy for 20+ min while completing ADLs at the sink. She was able to complete 1 more stand from the elevated seat with max A before requiring max +2 to stand again. She was very fatigued and required an extended rest break following transfer back to the w/c. Total A for LB dressing and bathing. Seated in the w/c she requires (S) for UB ADLs. She completed oral care and grooming tasks with set up assist at the sink seated. Pt required total A to don pants. She required total A and extra time to don shoes d/t pain. She was taken to the therapy gym. She completed a BUE strengthening circuit with bimanual support on a 3lb weight. Limited shoulder flexion, lifting to about 90 degrees. She completed forward flexion and elbow flexion 3x10 repetitions. She returned to her room and was left sitting up with all needs met, chair alarm set.     Session 2 Pt received sitting in the w/c with no c/o pain. Initial 10 min missed d/t meeting with therapy supervisor. Pt taken via w/c to the therapy gym. Initiated transfer to mat and pt politely requested she stay in the w/c and focus on her UE strengthening this session. She  completed 3x10 shoulder flexion superset with bicep flexion- 3lb dumbbell used with hands on facilitation to ensure proper muscle activation. She then completed tricep extension with chest press superset- 3x10 repetitions with a 3lb dumbbell. Hands on facilitation provided again. Exercises performed to improve UE support during ADL transfers. Pt was taken back to her room. Set up slideboard transfer and pt gave extra time to problem solve through head-hips relationship for proper off loading of each hip to scoot. She was able to complete the transfer with min A overall! She required heavy assist to bring her BLE into bed. She was left supine with an ice pack on her L knee. All needs within reach.    Therapy Documentation Precautions:  Precautions Precautions: Fall Precaution Comments: HOH Restrictions Weight Bearing Restrictions: Yes RLE Weight Bearing: Weight bearing as tolerated LLE Weight Bearing: Weight bearing as tolerated Other Position/Activity Restrictions: Transfers only   Therapy/Group: Individual Therapy  Curtis Sites 06/13/2022, 6:48 AM

## 2022-06-13 NOTE — Progress Notes (Signed)
Patient ID: Gabriela [name], female   DOB: [birth year], 88 y.o.   MRN: [id number]  Sw received phone call from patients granddaughter Gabriela [name]) requesting to speak with therapy supervisor. Sw informed Ben. No additional questions or concerns.

## 2022-06-13 NOTE — Progress Notes (Signed)
Unable to collect occult blood card  this tour, due to no BM .

## 2022-06-13 NOTE — Progress Notes (Signed)
Patient ID: Gabriela [name], female   DOB: 1925-09-02, 88 y.o.   MRN: [id number]  [facility] lift ordered through [place].
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
## 2022-06-13 NOTE — Progress Notes (Signed)
Physical Therapy Session Note  Patient Details  Name: Gabriela [name] MRN: [id number] Date of Birth: 18-Dec-1925  Today's Date: 06/13/2022 PT Individual Time: 1032-1132 PT Individual Time Calculation (min): 60 min   Short Term Goals: Week 2:  PT Short Term Goal 1 (Week 2): STG = LTG due to ELOS  Skilled Therapeutic Interventions/Progress Updates:      Therapy Documentation Precautions:  Precautions Precautions: Fall Precaution Comments: HOH Restrictions Weight Bearing Restrictions: Yes RLE Weight Bearing: Weight bearing as tolerated LLE Weight Bearing: Weight bearing as tolerated Other Position/Activity Restrictions: Transfers only   Pt received seated in w/c at bedside, agreeable to PT session with emphasis on w/c mobility and transfer training. Pt reports unrated bilateral knee pain and provided rest breaks for relief. Pt propelled w/c with BUE's ~75 ft with close supervision for safety and was transported remaining distance to ortho gym. Pt navigated ramp and requires min A to propel across threshold. Pt requires CGA while ascending ramp and max A for descending ramp. Pt transported to main gym and participated in w/c mobility and navigated obstacles and performed narrow based turns in manual w/c with supervision for safety. Pt transitioned to slide board transfer training and requires total A for board positioning. Initial transfer from bed to w/c required max A. PT educated pt on slide board transfer and with max verbal cueing pt performed transfer with min A for safety and increased time due to pain, decreased strength, and decreased activity tolerance. Pt performed resisted elbow flexion and extension with 2# weight bear 3 x 10 to increase UE strength and activity tolerance to better slide board transfers. Pt transported to room and left seated in w/c at bedside with chair alarm on and all needs within reach.   Therapy/Group: Individual Therapy  Verl Dicker Verl Dicker  PT, DPT  06/13/2022, 8:00 AM

## 2022-06-14 LAB — GLUCOSE, CAPILLARY
Glucose-Capillary: 87 mg/dL (ref 70–99)
Glucose-Capillary: 114 mg/dL — ABNORMAL HIGH (ref 70–99)
Glucose-Capillary: 78 mg/dL (ref 70–99)

## 2022-06-14 MED ORDER — ACETAMINOPHEN 325 MG PO TABS: 650.0000 mg | ORAL_TABLET | Freq: Three times a day (TID) | ORAL | Status: DC

## 2022-06-14 MED ORDER — ADULT MULTIVITAMIN W/MINERALS CH: 1.0000 | ORAL_TABLET | Freq: Every day | ORAL | Status: DC

## 2022-06-14 MED ORDER — METOPROLOL TARTRATE 25 MG/10 ML ORAL SUSPENSION: 6.2500 mg | Freq: Two times a day (BID) | ORAL | Status: DC

## 2022-06-14 MED ORDER — METOPROLOL TARTRATE 12.5 MG HALF TABLET: 12.5000 mg | ORAL_TABLET | Freq: Every day | ORAL | Status: DC

## 2022-06-14 MED ORDER — METOPROLOL SUCCINATE ER 25 MG PO TB24
12.5000 mg | ORAL_TABLET | Freq: Every day | ORAL | Status: DC
  Administered 2022-06-15: 12.5 mg via ORAL
  Filled 2022-06-14: qty 1

## 2022-06-14 MED ORDER — METHOCARBAMOL 500 MG PO TABS: 500.0000 mg | ORAL_TABLET | Freq: Two times a day (BID) | ORAL | Status: DC

## 2022-06-14 MED ORDER — CALCIUM CARBONATE ANTACID 500 MG PO CHEW: 400.0000 mg | CHEWABLE_TABLET | Freq: Every day | ORAL | Status: DC

## 2022-06-14 MED ORDER — POLYETHYLENE GLYCOL 3350 17 G PO PACK: 17.0000 g | PACK | Freq: Every day | ORAL | 0 refills | Status: DC

## 2022-06-14 MED ORDER — LORATADINE 10 MG PO TABS
10.0000 mg | ORAL_TABLET | Freq: Every day | ORAL | Status: DC
  Administered 2022-06-14 – 2022-06-22 (×9): 10 mg via ORAL
  Filled 2022-06-14 (×8): qty 1

## 2022-06-14 NOTE — Progress Notes (Addendum)
Patient ID: Gabriela [name], female   DOB: Aug 01, 1926, 88 y.o.   MRN: [id number]  SW received phone call from patient daughter, [name]. Daughter shares that sister shared with her that the plan is to release patient home on Saturday without care at home or we plan to discharge to SNF. SW informed daughter that her sister [name] reported and confirmed today the plan to discharge home with their mother to assist in addition to home care. SNF was not the plan for patient.   [name] reports that patient will not have care at home. Sw informed sister that the plan discussed was home with [name] to assist. Patient's children (2 daughters and 1 son) expressed that that cannot provide any physical assistance at home.  [name] lives out of town, her brother is suffering with cancer and [name] is the only one local living next to their mother.   [name] shares that we cannot release their mother home without care. Sw informed sister that [facility] and [facility] was discussed with [name] with her providing assistance at home. Insurance does not cover 24/7 care/supervision at home. Patient daughter requesting CAP services, sw asked patient daughter if she was aware if the patient had Medicaid. Daughter was unaware and reports that she would call back. SW shared with daughter that patient cannot remain on unit while in the process of obtaining services. SW offered to provide [facility] and [facility] resource for family to review. Daughter [name] shares "she does not need any list". Sw left [place] list in patients room for [name] to review per previous  discussion. [name] also reports that patient was using [facility].    Daughter requesting follow up from physician or ortho doctor in reference to recommendations. No additional questions or concerns.

## 2022-06-14 NOTE — Progress Notes (Signed)
Occupational Therapy Session Note  Patient Details  Name: Gabriela [name] MRN: [id number] Date of Birth: July 10, 1926  Session 1 Today's Date: 06/14/2022 OT Individual Time: 1130-1200 OT Individual Time Calculation (min): 30 min    Session 2 Today's Date: 06/14/2022 OT Individual Time: 1310-1405 OT Individual Time Calculation (min): 55 min    Short Term Goals: Week 2:  OT Short Term Goal 1 (Week 2): STG = LTG due to ELOS  Skilled Therapeutic Interventions/Progress Updates:    Session 1 Pt received in w/c with her granddaughter [name] present. Majority of session spent discussing pt CLOF, expectations, OT POC, and d/c planning. Provided clinical picture of pt progress in CIR and likely need to transition to hoyer level transfers at home if family/caregiver cannot provided mod-max A level slideboard care. Pt is still requiring max A to max A +2 for sit <> stands and relayed this to [place]. [name] reports it is her and her 77 yo daughter at home and she is currently unsure if she can provide this level of care. Provided recommendation that SNF level care seems to be the best option. [name] shared she had a previous family member go through a SNF for rehab and had a rapid decline and is therefore understandably resistant to this option. Discussed need to have formal family education days for hoyer transfers. Also clarified therapy POC and reasoning for working on standing still when this is not the d/c plan. For remainder of session pt worked on [place] transfer to/from the mat. She required mod A overall and mod-max cueing for technique. Pt was left sitting up in the w/c with all needs met, chair alarm set, and call bell within reach.     Session 2 Pt received sitting in the w/c with no c/o pain. She was agreeable to session. Pt taken to therapy gym via w/c. She completed a slideboard transfer to the mat with mod A, heavy cueing for head-hip relationship and technique. Pt with poor LE  activation/support. She required a seated rest break following. Worked on sit <> stands from the mat to benefit bone density and muscle mass and improve hip-glute muscle activation. She completed 4x1 repetitions of sit <> stands from very elevated mat with mod-max A with about a 3 inches of lift from the mat. She required extended rest breaks between each set. She completed a slideboard transfer back to the w/c with only mod A d/t it being downhill. Pt was returned to her room and left sitting up with all needs met.   Therapy Documentation Precautions:  Precautions Precautions: Fall Precaution Comments: HOH Restrictions Weight Bearing Restrictions: Yes RLE Weight Bearing: Weight bearing as tolerated LLE Weight Bearing: Weight bearing as tolerated Other Position/Activity Restrictions: Transfers only  Therapy/Group: Individual Therapy  Curtis Sites 06/14/2022, 6:39 AM

## 2022-06-14 NOTE — Progress Notes (Signed)
Physical Therapy Session Note  Patient Details  Name: Gabriela [name] MRN: [id number] Date of Birth: 1926/06/18  Today's Date: 06/14/2022 PT Individual Time: 9532-0233 PT Individual Time Calculation (min): 69 min   Short Term Goals: Week 1:  PT Short Term Goal 1 (Week 1): Pt will transfer to R w/ min A consistently. PT Short Term Goal 1 - Progress (Week 1): Not met PT Short Term Goal 2 (Week 1): Pt will transfer sit to stand w/ mod A. PT Short Term Goal 2 - Progress (Week 1): Not met PT Short Term Goal 3 (Week 1): Pt will transfer w/c <> bed w/ min A and SB. PT Short Term Goal 3 - Progress (Week 1): Not met PT Short Term Goal 4 (Week 1): PT will assess gait. PT Short Term Goal 4 - Progress (Week 1): Not met  Skilled Therapeutic Interventions/Progress Updates:  Patient seated upright on entrance to room. Patient alert and agreeable to PT session. Continues to use hand fan prn to improve comfort. Personal fan located and provided to pt for improved comfort with decreased effort.  Patient with no pain complaint at start of session.  Therapeutic Activity: Bed Mobility: Pt performed sit-->supine with Max/ TotA. VC/ tc required for technique. Pt is also able to grab bedrails with BUE and pull toward HOB with ModA.  Transfers: Pt performed slide board transfers throughout session with TotA for board placement, MaxA to initiate, and MinA to complete to EOB. Is able to scoot backards for improved positioning on bed with close supervision. Provided verbal cues for technique and effort throughout.  Neuromuscular Re-ed/ Therex: Pt performed the following exercises with vc/ tc for proper technique, 1x15, 1x10 - seated LAQs to max range AROM - seated marches to max range with AROM  NMR performed for improvements in motor control and coordination, balance, sequencing, judgement, and self confidence/ efficacy in performing all aspects of mobility at highest level of independence.   Educated to  attempt these exercise throughout day in order to improve strength and pain in BLE.   Patient supine at end of session with brakes locked, bed alarm set, and all needs within reach. K-pad setup at 50 deg F for cryotherapy to LLE incision site at lateral thigh. Rolled towel placed under R knee for pt's comfort and foam wedge applied for support on pt request. NT notified as to pt's positioning and potential need for check-up on comfort.  Pt missed 6 min of skilled therapy due to pain/ fatigue. Will re-attempt as schedule and pt availability permits.   Therapy Documentation Precautions:  Precautions Precautions: Fall Precaution Comments: HOH Restrictions Weight Bearing Restrictions: Yes RLE Weight Bearing: Weight bearing as tolerated LLE Weight Bearing: Weight bearing as tolerated Other Position/Activity Restrictions: Transfers only General: PT Amount of Missed Time (min): 6 Minutes PT Missed Treatment Reason: Patient fatigue;Pain Vital Signs:   Therapy/Group: Individual Therapy  Alger Simons PT, DPT, CSRS 06/14/2022, 6:01 PM

## 2022-06-14 NOTE — Progress Notes (Addendum)
Patient ID: Gabriela [name], female   DOB: 1925/12/05, 88 y.o.   MRN: [id number]  CAP application submitted. Sw informed process can take up to 3 months. F: [phone] SW left VM for, [name].  Daughter, Gabriela [name] informed. Daughter requesting "plan of care meeting".  Sw discussed the recommendation of SNF from team. Daughter declined stating they do not want her in a nursing home. Daughter expressed they want her home with care. SW informed patient daughter that we can offer [facility] resources to provide care/supervision until patient is able to begin receiving CAP services. Daughter shares that that are unable to pay for the cost of services. SW informed patient that insurance does not cover 24/7 care/supervision and family would have to assist with care until CAP services begin on [facility] initiated. SW informed daughter [facility] services are covered by insurance for continue PT and OT. Daughter requesting number for ombudsman. No additional questions or concerns.   *Physician will follow up with daughters on Wednesdays to provide conference updates and discuss concerns.. Daughters report Gabriela [name] should be present to discuss in patients room. Gabriela [name] will conference in [place]. Family education recommended, Gabriela [name] reports she will be present tomorrow. If patient daughter, Gabriela [name] not in room please call Aniwa and she will conference in [place]. Daughters requesting consult with surgeon.   Gabriela [name] [phone] Gabriela [name] [phone]
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
## 2022-06-14 NOTE — Progress Notes (Signed)
Physical Therapy Session Note  Patient Details  Name: Gabriela [name] MRN: [id number] Date of Birth: [birth year]  Today's Date: 06/14/2022 PT Individual Time: 0921-1009 PT Individual Time Calculation (min): 48 min   Short Term Goals: Week 1:  PT Short Term Goal 1 (Week 1): Pt will transfer to R w/ min A consistently. PT Short Term Goal 1 - Progress (Week 1): Not met PT Short Term Goal 2 (Week 1): Pt will transfer sit to stand w/ mod A. PT Short Term Goal 2 - Progress (Week 1): Not met PT Short Term Goal 3 (Week 1): Pt will transfer w/c <> bed w/ min A and SB. PT Short Term Goal 3 - Progress (Week 1): Not met PT Short Term Goal 4 (Week 1): PT will assess gait. PT Short Term Goal 4 - Progress (Week 1): Not met Week 2:  PT Short Term Goal 1 (Week 2): STG = LTG due to ELOS  Skilled Therapeutic Interventions/Progress Updates:  Patient supine in bed on entrance to room. Patient alert and agreeable to PT session.   Patient with continuous moderate to high pain complaint with all movement throughout session.  Therapeutic Activity: Bed Mobility: Pt performed supine --> sit EOB with ModA for slow movement of BLE toward and off of EOB. Min/ ModA for push of UB to upright seated position. VC/ tc required for effort into movement/ technique. Dressing initiated while seated EOB with pt able to doff old shirt and don new t-shirt with setup. Pants initiated with TotA and pt slowly able to clear feet from floor to assist with threading LEs into pants.   Transfers: Attempted sit<>stand with elevated bed surface and RW placement. Pt unable to shift weight forward onto LE d/t increased pain. Noted incontinence and pt assisted into supine with TotA.  Bed mobility: Pt requires assist of BLEs into hooklying for improved mobility. Pt is able to turn to each side with use of bedrails and MinA for BLEs. Pt requires MaxA for pericare and brief change. Attempts bridging requiring Mod/ MaxA to complete.   After  brief change and dressing, pt is able to perform slide board transfer from bed --> w/c with TotA for board placement, max A to initiate and MinA to complete.   Patient seated upright in w/c at end of session with brakes locked, belt alarm set, and all needs within reach.   Therapy Documentation Precautions:  Precautions Precautions: Fall Precaution Comments: HOH Restrictions Weight Bearing Restrictions: Yes RLE Weight Bearing: Weight bearing as tolerated LLE Weight Bearing: Weight bearing as tolerated Other Position/Activity Restrictions: Transfers only General:   Vital Signs:   Pain: Pain increased with mobility but is decreased with rest and repositioning.   Therapy/Group: Individual Therapy  Alger Simons PT, DPT, CSRS 06/14/2022, 10:20 AM

## 2022-06-14 NOTE — Discharge Summary (Signed)
Physician Discharge Summary  Patient ID: Gabriela [name] MRN: [id number] DOB/AGE: 10/18/1925 88 y.o.  Admit date: 06/02/2022 Discharge date: 06/22/2022  Discharge Diagnoses:  Principal Problem:   Oth fracture of unsp femur, init encntr for closed fracture [facility]) Active Problems:   Type 2 diabetes mellitus with renal manifestations (HCC)   Essential (primary) hypertension   Right hip pain   Chronic wound   Thrombocytopenia (HCC)   Anemia of chronic disease   CKD (chronic kidney disease) stage 4, GFR 15-29 ml/min (HCC)   Pressure ulcer with suspected deep tissue injury, unstageable ([place])   Discharged Condition: stable  Significant Diagnostic Studies: DG Chest 2 View  Result Date: 06/20/2022 CLINICAL DATA:  Cough. EXAM: CHEST - 2 VIEW COMPARISON:  June 11, 2022. FINDINGS: Stable cardiomediastinal silhouette. Status post coronary bypass graft and cardiac valve repair. No acute pulmonary disease is noted. Bony thorax is unremarkable. IMPRESSION: No active cardiopulmonary disease. Electronically Signed   By: Marijo Conception M.D.   On: 06/20/2022 16:28   DG Knee 1-2 Views Right  Result Date: 06/15/2022 CLINICAL DATA:  Right femur fracture. EXAM: RIGHT KNEE - 1-2 VIEW COMPARISON:  05/26/2022 FINDINGS: Lateral plate and screw fixation of mid and distal femur unchanged. Comminuted fracture supracondylar femur with displaced fragment similar to the prior study. No joint effusion. Advanced osteopenia.  Arterial calcification IMPRESSION: Comminuted supracondylar fracture distal right femur unchanged. Lateral plate and screw fixation. Electronically Signed   By: Franchot Gallo M.D.   On: 06/15/2022 10:57   DG Knee 1-2 Views Left  Result Date: 06/15/2022 CLINICAL DATA:  Femur fracture EXAM: LEFT KNEE - 1-2 VIEW COMPARISON:  06/06/2022 FINDINGS: Lateral plate and screws in the mid and distal femur unchanged. Subtle linear lucency distal supracondylar femur is less apparent on today's study likely  due to projection and possibly some interval healing. Degenerative change in the knee with joint space narrowing specially laterally. Arterial calcification IMPRESSION: Lateral plate fixation of femur. Subtle transverse fracture distal supracondylar femur is less apparent on today's study. Electronically Signed   By: Franchot Gallo M.D.   On: 06/15/2022 10:56   DG Chest 1 View  Result Date: 06/11/2022 CLINICAL DATA:  Shortness of breath EXAM: CHEST  1 VIEW COMPARISON:  04/04/2021 FINDINGS: Cardiomegaly status post median sternotomy with aortic valve prosthesis. Mild, diffuse bilateral interstitial pulmonary opacity. Osseous structures unremarkable. IMPRESSION: Cardiomegaly with mild, diffuse bilateral interstitial pulmonary opacity, likely edema. No focal airspace opacity. Electronically Signed   By: Delanna Ahmadi M.D.   On: 06/11/2022 19:26   MR KNEE LEFT WO CONTRAST  Result Date: 06/08/2022 CLINICAL DATA:  Meniscal injury, knee EXAM: MRI OF THE LEFT KNEE WITHOUT CONTRAST TECHNIQUE: Multiplanar, multisequence MR imaging of the knee was performed. No intravenous contrast was administered. COMPARISON:  Multiple prior knee radiographs FINDINGS: Plate fixation of the distal femur with associated susceptibility artifact, distorting adjacent bony and soft tissues. MENISCI Medial: No evidence of medial meniscus tear. Lateral: There is degenerative tearing of the lateral meniscal body with substance loss. LIGAMENTS Cruciates: The cruciate ligaments are incompletely evaluated due to susceptibility artifact. Collaterals: The medial collateral ligament appears intact. The distal lateral collateral ligament and biceps femoris are intact. The proximal LCL is obscured by susceptibility artifact. Popliteus tendon appears intact. CARTILAGE Patellofemoral: Obscured by susceptibility artifact. There is at least mild chondrosis. Medial:  Mild chondrosis. Lateral: Intermediate high-grade cartilage loss along the  weight-bearing surfaces as evidenced by joint space narrowing. Fine detail is obscured by susceptibility artifact. JOINT:  Small joint effusion. POPLITEAL FOSSA: Small Baker's cyst with a fluid-fluid level. EXTENSOR MECHANISM: Intact quadriceps tendon. Lax but intact appearing patellar tendon. BONES: Distal femur fracture status post lateral plate fixation. There is associated marrow edema. Other: No focal fluid collection. There is soft tissue swelling along the knee. IMPRESSION: Distal femur fracture status post lateral plate fixation, with associated susceptibility artifact. 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 Small joint effusion. Small Baker's cyst with a fluid-fluid level suggesting presence of blood products. Electronically Signed   By: Maurine Simmering M.D.   On: 06/08/2022 12:28   DG Knee 1-2 Views Left  Result Date: 06/06/2022 CLINICAL DATA:  Left anterior knee pain for 2 weeks. EXAM: LEFT KNEE - 1-2 VIEW COMPARISON:  Knee radiograph 05/26/2022 FINDINGS: Lateral plate and screw fixation of the distal femoral shaft and metaphysis. Linear fragment of the metaphyseal region redemonstrated. Bones in anatomic alignment. Degenerative changes. Vascular calcifications. IMPRESSION: Lateral plate and screw fixation of the distal femoral shaft and metaphysis. Electronically Signed   By: Lovey Newcomer M.D.   On: 06/06/2022 16:12    Labs:  Basic Metabolic Panel:    Latest Ref Rng & Units 06/21/2022    2:23 PM 06/20/2022    6:54 AM 06/13/2022    7:00 AM  BMP  Glucose 70 - 99 mg/dL 132  89  98   BUN 8 - 23 mg/dL 43  40  29   Creatinine 0.44 - 1.00 mg/dL 1.36  1.38  1.18   Sodium 135 - 145 mmol/L 140  142  140   Potassium 3.5 - 5.1 mmol/L 4.6  5.3  4.5   Chloride 98 - 111 mmol/L 108  115  113   CO2 22 -  32 mmol/L _0 Calcium 8.9 - 10.3 mg/dL 8.9  8.6  8.6       CBC:    Latest Ref Rng & Units 06/20/2022    8:16 AM 06/13/2022    5:42 AM 06/09/2022    6:28 AM  CBC  WBC 4.0 - 10.5 K/uL 6.2  5.4  3.3   Hemoglobin 12.0 - 15.0 g/dL 7.4  8.2  7.9   Hematocrit 36.0 - 46.0 % 23.0  25.6  25.5   Platelets 150 - 400 K/uL PLATELET CLUMPS NOTED ON SMEAR, UNABLE TO ESTIMATE  76  46      CBG: Recent Labs  Lab 06/17/22 0611 06/18/22 0506 06/19/22 0637 06/20/22 0548 06/21/22 0610  GLUCAP 77 73 88 80 78    Brief HPI:   Gabriela [name] is a 88 y.o. female is a 88 year old female with history of T2DM with nephropathy, HTN, MGUS, chronic RLE wound with recent admission for metabolic encephalopathy of Pseudomonas wound infection; who was admitted on 05/26/2022 after a fall at home while working with physical therapy.  She was found to have right distal femur fracture and left comminuted distal femur fracture with angulation and intra-articular extension into the lateral femoral condyle.  She underwent ORIF right and left supra condylar fractures by Dr. Marcelino Scot on the same day.  Postop to be WBAT and continue low-dose ASA.  Recommendations were for sutures to stay in place x2 weeks with follow-up x-rays at that time.  She did have a rise in her white count to 21.2 and Dr. Drucilla Schmidt  was consulted for input.  CT of right hip was done revealing postsurgical changes of chronic osteolysis around the acetabular Cup and chronic fluid collections within right iliopsoas bursa.  As thigh wound was almost closed with minimal drainage decision was made not to aspirate the fluid collection and to monitor off antibiotics as this was a safe option.  Her leukocytosis was resolved.  She was afebrile and pain in control was improving.  PT OT was working with patient who continued to be limited by pain, weakness and fatigue.  CIR was recommended due to functional decline.   Hospital Course: Gabriela [name] was admitted  to rehab 06/02/2022 for inpatient therapies to consist of PT and OT at least three hours five days a week. Past admission physiatrist, therapy team and rehab RN have worked together to provide customized collaborative inpatient rehab. Her blood pressures were monitored on TID basis and has been reasonably stable. She did report issues with congestion and claritin ws not effective therefore sudafed was added to help manage symptoms. This was discontinued as it caused elevation in BP and was not effective in managing symptoms. CXR was negative for fluid or infection. Her diabetes was initially monitored with ac/hs CBG checks and as BS controlled, family requested changing BS checks to daily in am due to history of am hypoglycemia. Supplements have been added to help promote wound healing in addition to HS snack to stabilize BS thorough the night.    Due to issues with hyperkalemia, her nutritional supplements were changed to nephro to help promote wound healing.  Follow-up CBC showed H&H to be relatively stable and no elevation in white count noted.  Check of B med showed renal status to be trending back to baseline.  She did have recurrent hyperkalemia and was treated with 1 dose of Lokelma on 11/06.  She was advised to increase water intake due to elevation in BUN and follow-up labs showed potassium levels have normalized.  She continued to be limited by issues with pain in the left knee greater than the right and this affected her overall activity tolerance as well as progress.  MRI of left knee was done 10/25 due to concerns of meniscal injury and showed tricompartmental OA of right knee without fluid collection, degenerative tearing of lateral meniscus body and soft tissue swelling.    Oxycodone was scheduled prior to therapy sessions, Voltaren gel was added for local measures in addition to heat or ice to be used for pain control.  She did develop a DTPI on right heel which is being treated with foam  dressing, use of Prevalon boot at night as well as air mattress overlay added for pressure relief measures.  MASD on sacrum being treated with foam dressing for padding.  Therapy has been working with patient and her goals were not met as patient continues to require mod assist for bathing at bed level as well as Hoyer lift for transfers.  She is nonambulatory at this time and wheelchair bound.  Skilled nursing facility was discussed with patient and family due to amount of care needed however family elected on trial at home as this was patient's preference.  She currently requires min to max assist and her family is to provide assistance after discharge.  She will continue to receive further follow-up home health PT, OT and RN by [facility] after discharge.    Rehab course: During patient's stay in rehab weekly team conferences were held to monitor patient's progress, set  goals and discuss barriers to discharge. At admission, patient required total assist with mobility and with basic ADL tasks.  She  has had improvement in activity tolerance, balance, postural control as well as ability to compensate for deficits. ADL tasks were done and bed level and she requires supervision for upper body bathing and dressing.  She requires mod to max assist for lower body bathing and dressing.  She requires max assist with toileting and toilet transfers.  She is able to perform transfers with use of SB at [facility] to min assist. She is able to propel her wheelchair 400 feet with supervision and verbal cues.  Family education was completed with granddaughter.   Disposition: Home  Diet: heart healthy/Carb Modified.   Wound care:  Left heel: Cover deep tissue injury with foam dressing. Shoes when out of bed and prevalon boots in bed.    2. Sacrum: Cover with foam dressing for pressure relief.     Special Instructions: Recommend repeat CBC/BMET in 1-2 weeks to monitor renal status, H/H and platelets Continue to  monitor BS daily Wear shoes when out of bed. Use prevalon boot on right foot for pressure relief measures.    Discharge Instructions     Ambulatory referral to Physical Medicine Rehab   Complete by: As directed       Allergies as of 06/21/2022       Reactions   Sulfa Antibiotics Rash        Medication List     STOP taking these medications    alum & mag hydroxide-simeth 200-200-20 MG/5ML suspension Commonly known as: MAALOX/MYLANTA   bisacodyl 5 MG EC tablet Commonly known as: DULCOLAX   furosemide 20 MG tablet Commonly known as: LASIX   metoprolol tartrate 25 MG tablet Commonly known as: LOPRESSOR       TAKE these medications    Accu-Chek Aviva Plus test strip Generic drug: glucose blood USE AS INSTRUCTED DX E11.9   Accu-Chek Aviva Plus w/Device Kit 1 each by Does not apply route daily. Dx E11.9   Accu-Chek FastClix Lancets Misc To check blood sugars once a day. DX E11.9   acetaminophen 325 MG tablet Commonly known as: TYLENOL Take 2 tablets (650 mg total) by mouth 3 (three) times daily. Wean as able   albuterol 108 (90 Base) MCG/ACT inhaler Commonly known as: VENTOLIN HFA INHALE 1-2 PUFFS INTO THE LUNGS EVERY 4 HOURS AS NEEDED FOR WHEEZING OR SHORTNESS OF BREATH. What changed: See the new instructions.   aspirin EC 81 MG tablet Take 1 tablet (81 mg total) by mouth daily.   calcium carbonate 500 MG chewable tablet Commonly known as: TUMS - dosed in mg elemental calcium Chew 2 tablets (400 mg of elemental calcium total) by mouth daily.   cyanocobalamin 1000 MCG tablet Take 0.5 tablets (500 mcg total) by mouth daily.   diclofenac Sodium 1 % Gel Commonly known as: VOLTAREN Apply 2 g topically 4 (four) times daily.   ferrous sulfate 325 (65 FE) MG tablet Take 1 tablet (325 mg total) by mouth daily with breakfast. What changed: additional instructions   loratadine 10 MG tablet Commonly known as: CLARITIN Take 1 tablet (10 mg total) by mouth  daily.   methocarbamol 500 MG tablet Commonly known as: ROBAXIN Take 1 tablet (500 mg total) by mouth every 6 (six) hours as needed for muscle spasms.   multivitamin with minerals Tabs tablet Take 1 tablet by mouth daily.   oxyCODONE 5 MG immediate release tablet--Rx #  35 pills. Commonly known as: Oxy IR/ROXICODONE Take 1 tablet (5 mg total) by mouth every 4 (four) hours as needed for moderate pain.   polyethylene glycol powder 17 GM/SCOOP powder Commonly known as: GLYCOLAX/MIRALAX Take 17 g by mouth daily.   Simbrinza 1-0.2 % Susp Generic drug: Brinzolamide-Brimonidine Place 1 drop into both eyes 2 (two) times daily.   timolol 0.5 % ophthalmic solution Commonly known as: TIMOPTIC Place 1 drop into both eyes 2 (two) times daily.   Vitamin D (Ergocalciferol) 1.25 MG (50000 UNIT) Caps capsule Commonly known as: DRISDOL Take 1 capsule (50,000 Units total) by mouth every 7 (seven) days. Start taking on: June 25, 2022        Follow-up Information     Simmons-Robinson, Riki Sheer, MD Follow up.   Specialty: Family Medicine Why: Call in 1-2 days for post hospital follow up Contact information: [address] [place] [address] [phone]         Altamese [place], MD Follow up.   Specialty: Orthopedic Surgery Why: Call in 1-2 days for post hospital follow up Contact information: [place] [address] [phone]         Izora Ribas, MD Follow up.   Specialty: Physical Medicine and Rehabilitation Why: office will call you with follow up appointment Contact information: [address] [place] [address] [phone]                 Signed: Bary Leriche 06/23/2022, 4:59 PM

## 2022-06-14 NOTE — Progress Notes (Signed)
Patient ID: Gabriela [name], female   DOB: 09-28-1925, 88 y.o.   MRN: [id number]  Sw spoke with patient granddaughter, [name] [name] aware of the order for hoyer lift and concerned due to reporting not using a hoyer lift during therapy sessions but instead they have been slide board transferring during therapy sessions. Sw will follow up with therapy team. Granddaughter will be present between 12-2 PM today.  Sw will provide patient granddaughter with [facility] resources once present. No additional questions or concerns.

## 2022-06-15 ENCOUNTER — Inpatient Hospital Stay (HOSPITAL_COMMUNITY): Payer: Medicare Other

## 2022-06-15 LAB — GLUCOSE, CAPILLARY
Glucose-Capillary: 89 mg/dL (ref 70–99)
Glucose-Capillary: 95 mg/dL (ref 70–99)
Glucose-Capillary: 102 mg/dL — ABNORMAL HIGH (ref 70–99)
Glucose-Capillary: 105 mg/dL — ABNORMAL HIGH (ref 70–99)

## 2022-06-15 MED ORDER — SODIUM CHLORIDE 0.9 % IV SOLN
250.0000 mg | Freq: Every day | INTRAVENOUS | Status: AC
  Administered 2022-06-15 – 2022-06-16 (×2): 250 mg via INTRAVENOUS
  Filled 2022-06-15 (×2): qty 20

## 2022-06-15 MED ORDER — METOPROLOL TARTRATE 12.5 MG HALF TABLET
12.5000 mg | ORAL_TABLET | Freq: Every day | ORAL | Status: DC
  Administered 2022-06-16: 12.5 mg via ORAL
  Filled 2022-06-15: qty 1

## 2022-06-15 NOTE — Plan of Care (Signed)
Wound Plan   Braden Score: 14  Sensory: 3  Moisture: 2  Activity: 2  Mobility: 2  Nutrition: 2  Friction: 2   Wounds present: see previous wound plan, pics in epic  Interventions: Continue previous wound plan

## 2022-06-15 NOTE — Progress Notes (Signed)
Physical Therapy Session Note  Patient Details  Name: Gabriela [name] MRN: [id number] Date of Birth: Dec 20, 1925  Today's Date: 06/15/2022 PT Individual Time: 2641-5830 PT Individual Time Calculation (min): 18 min  and Today's Date: 06/15/2022 PT Missed Time: 57 Minutes Missed Time Reason: Pain;Patient fatigue;Patient unwilling to participate  Short Term Goals: Week 1:  PT Short Term Goal 1 (Week 1): Pt will transfer to R w/ min A consistently. PT Short Term Goal 1 - Progress (Week 1): Not met PT Short Term Goal 2 (Week 1): Pt will transfer sit to stand w/ mod A. PT Short Term Goal 2 - Progress (Week 1): Not met PT Short Term Goal 3 (Week 1): Pt will transfer w/c <> bed w/ min A and SB. PT Short Term Goal 3 - Progress (Week 1): Not met PT Short Term Goal 4 (Week 1): PT will assess gait. PT Short Term Goal 4 - Progress (Week 1): Not met Week 2:  PT Short Term Goal 1 (Week 2): STG = LTG due to ELOS  Skilled Therapeutic Interventions/Progress Updates:  Patient supine in bed asleep on entrance to room. Patient requires time and effort to rouse. No immediately agreeable to PT session. Related to to pt that it is time to be dressed for today's therapy sessions. Agreeable initially to participate in dressing. While collecting pt's clothing and arranging room furniture for improved mobility and to bring w/c and slideboard to bedside.   Attempt to remove pillows from under pt's BLE with pt wincing briefly in increased pain. Patient with no pain complaint at start of session.  Patient *** at end of session with brakes locked, *** alarm set, and all needs within reach.   Therapy Documentation Precautions:  Precautions Precautions: Fall Precaution Comments: HOH Restrictions Weight Bearing Restrictions: Yes RLE Weight Bearing: Weight bearing as tolerated LLE Weight Bearing: Weight bearing as tolerated Other Position/Activity Restrictions: Transfers only General:   Vital Signs:  Pain:    Mobility:   Locomotion :    Trunk/Postural Assessment :    Balance:   Exercises:   Other Treatments:      Therapy/Group: Individual Therapy  Alger Simons PT, DPT, CSRS 06/15/2022, 8:56 AM

## 2022-06-15 NOTE — Progress Notes (Addendum)
Physical Therapy Session Note  Patient Details  Name: Gabriela [name] MRN: [id number] Date of Birth: 1926-01-03  Today's Date: 06/15/2022 PT Individual Time:  4591-3685  PT Individual Time Calculation (min): 46 min  Short Term Goals: Week 1:  PT Short Term Goal 1 (Week 1): Pt will transfer to R w/ min A consistently. PT Short Term Goal 1 - Progress (Week 1): Not met PT Short Term Goal 2 (Week 1): Pt will transfer sit to stand w/ mod A. PT Short Term Goal 2 - Progress (Week 1): Not met PT Short Term Goal 3 (Week 1): Pt will transfer w/c <> bed w/ min A and SB. PT Short Term Goal 3 - Progress (Week 1): Not met PT Short Term Goal 4 (Week 1): PT will assess gait. PT Short Term Goal 4 - Progress (Week 1): Not met Week 2:  PT Short Term Goal 1 (Week 2): STG = LTG due to ELOS  Skilled Therapeutic Interventions/Progress Updates:  Informed prior to session that pt's granddaughter is expected to be in session for unscheduled family education. Granddaughter not present throughout session. Patient seated in w/c on entrance to room. Patient alert and agreeable to PT session.   Patient with no pain complaint at start of session. Relates being pre-medicated.  Therapeutic Activity: Transfers: Pt performed slide board transfers w/c <> bed with MaxA to place board and initiate, then pt able to slowly perform and complete with intermittent MinA. Provided verbal cues for using BLE to assist UE and to bear light weight in forward lean for upweighting from board.  Pt seated on edge of deflated bed and guided in incremental increases in bed height in order to increase WB to BLE. She is able to slowly scoot forward with CGA throughout each rise in bed height. Pt at ~120 deg of knee bend with no pain complaint.   Reminded pt of marches and LAQs that she can perform between sessions in order to strengthen and improve tolerance to pain.   Patient seated in w/c at end of session with brakes locked, belt alarm  set, gait belt left on pt for upcoming OT session, and all needs within reach.   Therapy Documentation Precautions:  Precautions Precautions: Fall Precaution Comments: HOH Restrictions Weight Bearing Restrictions: Yes RLE Weight Bearing: Weight bearing as tolerated LLE Weight Bearing: Weight bearing as tolerated Other Position/Activity Restrictions: Transfers only  Therapy/Group: Individual Therapy  Alger Simons PT, DPT, CSRS 06/15/2022, 10:27 AM

## 2022-06-15 NOTE — Patient Care Conference (Signed)
Inpatient RehabilitationTeam Conference and Plan of Care Update Date: 06/15/2022   Time: 11:35 AM    Patient Name: Gabriela [name]      Medical Record Number: [id number]  Date of Birth: Dec 23, 1925 Sex: Female         Room/Bed: 4W24C/4W24C-01 Payor Info: Payor: [employer] MEDICARE / Plan: [facility] MEDICARE / Product Type: *No Product type* /    Admit Date/Time:  06/02/2022  1:20 PM  Primary Diagnosis:  Oth fracture of unsp femur, init encntr for closed fracture [facility])  Hospital Problems: Principal Problem:   Oth fracture of unsp femur, init encntr for closed fracture [facility]) Active Problems:   Type 2 diabetes mellitus with renal manifestations (HCC)   Essential (primary) hypertension   Right hip pain   Chronic wound   Thrombocytopenia (HCC)   Anemia of chronic disease   CKD (chronic kidney disease) stage 4, GFR 15-29 ml/min [facility])    Expected Discharge Date: Expected Discharge Date: 06/22/22  Team Members Present: Physician leading conference: Dr. Leeroy Cha Social Worker Present: Erlene Quan, BSW Nurse Present: Dorien Chihuahua, RN PT Present: Other (comment) (Waldorf, PT) OT Present: Jennefer Bravo, OT PPS Coordinator present : Gunnar Fusi, SLP     Current Status/Progress Goal Weekly Team Focus  Bowel/Bladder     Incontinent of bowel and bladder   Toileting continently   Toileting protocol  Swallow/Nutrition/ Hydration             ADL's   (S) UB ADLs, max-total A LB ADLs. Total A toileting. Mod-max A slideboard. max +2 to stand  min A overall with several goals d/c  functional transfers, ADLs, pain management, d/c planning, family edu   Mobility   Max/TotalA for bed mobility; Max/TotalA for squat pivot transfers; MaxA to initiate and Min/ModA slideboard transfers - no ambulation, minimal w/c mobility  Supv with transfers; Gait and stair goals discharged  continued: strength, balance, pain management, endurance, transfers, w/c mobility - need family ed  with caregiver for slide board transfers   Communication             Safety/Cognition/ Behavioral Observations            Pain     Tylenol prn   Pain managed < 4 with prns   Assess pain and need for prn, effectiveness of medications.  Skin     DTI to heel; Wound right thigh healing   Wound healing   Monitor skin    Discharge Planning:  Discharging home with granddaughter and HC to assist 24/7   Team Discussion: Patient's functional status fluctuates with fatigue and fear based in-ability to stand post femur fracture. MD ordered IV iron for anemia.  Patient on target to meet rehab goals: Patient currently supervision for upper body ADLs, max assist - total assist for lower body care. Max assist to stand, max - total squat pivot. Min - mod assist for slide board.   *See Care Plan and progress notes for long and short-term goals.   Revisions to Treatment Plan:  We have +2 assist always available if needed. Team anticipates 100% standing will not be functional at discharge. Adjust focus to bone density improvement, standing is mostly for bone health rather than transfers. Downgraded goals; no ambulation goals (no steps or gait goals) Assess recommendation for power chair and home access with DME Extension of ELOS to address family education/hands on practice  Teaching Needs: Optometrist, slide board transfers, skin care, toileting, medications, etc  Current  Barriers to Discharge: Decreased caregiver support and Home enviroment access/layout patient and family decline SNF placement  Possible Resolutions to Barriers: Family education DME: hospital bed, hoyer lift, HH follow up services Non emergent transportation to home    Medical Summary Current Status: anemia of chronic disease, femur fracture, bilateral knee pain, CBGs well controlled  Barriers to Discharge: Medical stability  Barriers to Discharge Comments: anemia of chronic disease, femur fracture, bilateral  knee pain, CBGs well controlled Possible Resolutions to [employer] Focus: placed outpaitent nephrology consult, reached out to ortho to discuss prognosis with family, continue voltaren gel, changed CBGs to before lunch   Continued Need for Acute Rehabilitation Level of Care: The patient requires daily medical management by a physician with specialized training in physical medicine and rehabilitation for the following reasons: Direction of a multidisciplinary physical rehabilitation program to maximize functional independence : Yes Medical management of patient stability for increased activity during participation in an intensive rehabilitation regime.: Yes Analysis of laboratory values and/or radiology reports with any subsequent need for medication adjustment and/or medical intervention. : Yes   I attest that I was present, lead the team conference, and concur with the assessment and plan of the team.   Dorien Chihuahua B 06/15/2022, 2:30 PM

## 2022-06-15 NOTE — Progress Notes (Addendum)
Patient ID: Gabriela [name], female   DOB: 20-Feb-1926, 88 y.o.   MRN: [id number]  Team Conference Report to Patient/Family  Team Conference discussion was reviewed with the patient and caregiver, including goals, any changes in plan of care and target discharge date.  Patient and caregiver express understanding and are not in agreement (see progress notes).  The patient has a target discharge date of 06/22/22.  SW called patient granddaughter, Gabriela [name] and informed her of team conference updates. SW made attempt to schedule family education. Patient granddaughter, Gabriela [name] conferenced in patient other granddaughter, Gabriela [name] to discuss updates.  Gabriela [name] shares that she will be in town this weekend but will not have time to attend family education. Her plan was to arrange patient's home. Sw informed family sw was updated by physican to proceed with hoyer. Neither granddaughter agreed to family education due to them expressing "not wanting to be the caregiver". Sw informed patient family that patient will require assistance at home.   Sw informed daughters that it would be beneficial to complete family education because this would give family a better idea of care at home and therapist can confirm DME recommended. Gabriela [name] expresses that she feels like CIR is unprofessional due to them having a different conversation with the physician. SW asked family to elaborate due to being unaware, granddaughter report that physician agreed to discharge on Saturday, 11/11. Granddaughter report that physician expressed patient should not discharge home with a hoyer and should practice transfers and standing.  Sw discussed the need for non emergency medical tranfers due to not completing car transfers. SW will follow up with physician and team. No additional questions or concerns.    Dyanne Iha 06/15/2022, 12:57 PM

## 2022-06-15 NOTE — Progress Notes (Addendum)
Occupational Therapy Session Note  Patient Details  Name: Gabriela [name] MRN: [id number] Date of Birth: July 07, 1926  Session 1 Today's Date: 06/15/2022 OT Individual Time: 1100-1157 OT Individual Time Calculation (min): 57 min    Session 2 Today's Date: 06/15/2022 OT Individual Time: 2707-8675 OT Individual Time Calculation (min): 41 min    Short Term Goals: Week 2:  OT Short Term Goal 1 (Week 2): STG = LTG due to ELOS  Skilled Therapeutic Interventions/Progress Updates:    Pt supine with 7/10 pain in her L knee described as aching. She completed bed mobility rolling R and L with min A with heavy use of bed rails. She completed anterior peri hygiene with min A, and max A for posterior hygiene. Pt was heavily incontinent of urine. She completed LB dressing with total A with rolling R and L. Pt completed bed mobility to EOB with mod A with cueing for technique and LE management. Pt completed slideboard transfer to the w/c with max A. Very poor lifting of hips. Pt also very rigid in BLE and requiring min A to reposition at times. Question pain vs fear of falling at times. She completed oral care and grooming tasks at the sink with set up assist. She was left sitting up in the w/c with all needs met, chair alarm set.    Session 2 Pt received sitting in the w/c with no c/o pain at rest. She was taken to the therapy gym via w/c. She completed a slideboard transfer from the w/c to the mat with mod A with much improved initiation of scooting overall. From EOM the stedy was positioned and pt completed sit > stand with mod-max +2 assist from a very elevated mat. Standing completed to improve bone density and maintain muscle mass, as well as hopeful progression to standing functionally in the future. She was able to complete 2 trials in this fashion. She then sat perched on the stedy seat and d/t fatigue required heavy max A to stand from here and return to the mat. Slideboard back to the w/c with only  min A! Extra time and board positioned well. She ended session with BUE strengthening circuit focused on triceps and shoulder flex/ext for carryover to assisting with ADL transfers. Pt used a 3lb dumbbell and hands on facilitation was provided to ensure proper technique. She returned to her room. Pt was left sitting up in the w.c with all needs met, chair alarm set, and call bell within reach.     Therapy Documentation Precautions:  Precautions Precautions: Fall Precaution Comments: HOH Restrictions Weight Bearing Restrictions: Yes RLE Weight Bearing: Weight bearing as tolerated LLE Weight Bearing: Weight bearing as tolerated Other Position/Activity Restrictions: Transfers only   Therapy/Group: Individual Therapy  Curtis Sites 06/15/2022, 6:52 AM
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
## 2022-06-15 NOTE — Progress Notes (Signed)
Recreational Therapy Session Note  Patient Details  Name: TANISH SINKLER MRN: [id number] Date of Birth: 1926/06/05 Today's Date: 06/15/2022  Pain: no c/o  Rounded on pt today to provide social/emotional support.  Pt smiling and laughing with CTRS/LRT discussing previous interests.  Pt left with word puzzle book within reach while awaiting next scheduled therapy session.  Pt appreciative of this visit.  Abram Sax 06/15/2022, 4:07 PM

## 2022-06-16 LAB — GLUCOSE, CAPILLARY: Glucose-Capillary: 79 mg/dL (ref 70–99)

## 2022-06-16 NOTE — Progress Notes (Signed)
Occupational Therapy Session Note  Patient Details  Name: Gabriela [name] MRN: [id number] Date of Birth: [birth year]  Today's Date: 06/16/2022 OT Individual Time: 2992-4268 OT Individual Time Calculation (min): 57 min    Short Term Goals: Week 2:  OT Short Term Goal 1 (Week 2): STG = LTG due to ELOS  Skilled Therapeutic Interventions/Progress Updates:  Pt greeted seated in w/c, pt declined need for ADLs but agreeable to OT intervention. Total A transport to gym where session focus on functional mobility.  Pt completed SB transfer from w/c> EOM to R side with MINA. Pt requires cues to shift weight anteriorly during transfer and needs MINA to scoot laterally across board as well as adjust feet during transfer, pt requires total A to place board.  Once on EOM used stedy to practice sit>stands to work on LB strengthening and maintain muscle integrity. Pt completed x4 sit>stands from EOM with MAX A, once pt seated on seat of stedy pt still requires at least MOD- MAX A to stand from seat of stedy as pt with difficulty shifting hips anteriorly during transfer.   Worked on seated LB therex  to increase LB strength/endurance for higher level functional mobility tasks, with pt completing x20 LAQS with level 2 theraband from seated on EOM. Additionally worked on seated marches in [place] with pt having most difficulty elevating RLE.   Pt completed additional SB transfer to w/c to L side with pt completing transfer with only CGA!  Total A transport back to room where pt left seated in w/c with alarm belt activated and all needs within reach.   Therapy Documentation Precautions:  Precautions Precautions: Fall Precaution Comments: HOH Restrictions Weight Bearing Restrictions: Yes RLE Weight Bearing: Weight bearing as tolerated LLE Weight Bearing: Weight bearing as tolerated Other Position/Activity Restrictions: Transfers only   Pain: Unrated pain reported in BLEs, rest breaks and repositioning  provided as needed.     Therapy/Group: Individual Therapy  Corinne Ports [facility] 06/16/2022, 12:25 PM

## 2022-06-16 NOTE — Progress Notes (Signed)
Occupational Therapy Session Note  Patient Details  Name: Gabriela [name] MRN: [id number] Date of Birth: Nov 09, 1925  Today's Date: 06/16/2022 OT Individual Time: 4709-6283 OT Individual Time Calculation (min): 70 min    Short Term Goals: Week 2:  OT Short Term Goal 1 (Week 2): STG = LTG due to ELOS  Skilled Therapeutic Interventions/Progress Updates:     Pt greeted seated in w/c with dietary present taking pt orders. Pt declined need for ADLs but agreeable to OT intervention. Total A transport to therapy gym for transfer and functional mobility practice and to participate in therapeutic activities to improve UE strength, coordination, and facilitate WB through BLEs.  Pt propelled self in wc around therapy gym to participate in therapeutic games during the "therapy fall festival". Pt sat in wc to complete games with BLEs on floor anterior weight shifting to throw bean bags into hole on corn hole board, toss hoops around pumpkins, and to bounce balls into cups. Pt completed transfers EOM<>WC with min A and increased time. Pt maintained dynamic sitting balance while leaning anteriorly to pick up apples using a reacher. Pt required rest break during task d/t decreased activity tolerance, but was able to complete task following rest break. Pt propelled self back to room ~50 ft transitioning to total A to complete transport to room. Pt was left resting in her wc with seat belt alarm on, call bell in reach, and all needs met.   Therapy Documentation Precautions:  Precautions Precautions: Fall Precaution Comments: HOH Restrictions Weight Bearing Restrictions: Yes RLE Weight Bearing: Weight bearing as tolerated LLE Weight Bearing: Weight bearing as tolerated Other Position/Activity Restrictions: Transfers only General:    Pain: 0/10 Pain Assessment Pain Scale: 0-10 Pain Score: 3  ADL: ADL Eating: Independent Where Assessed-Eating: Edge of bed Grooming: Supervision/safety Where  Assessed-Grooming: Sitting at sink Upper Body Bathing: Supervision/safety Where Assessed-Upper Body Bathing: Sitting at sink Lower Body Bathing: Dependent (x2 assist using stedy to stand and wash buttocks) Where Assessed-Lower Body Bathing: Standing at sink, Other (Comment) (stedy) Upper Body Dressing: Minimal assistance (to pull over head) Where Assessed-Upper Body Dressing: Wheelchair Lower Body Dressing: Dependent (able to thread L LE into brief with reacher only. Total A for pants management and 2 people using stedy) Where Assessed-Lower Body Dressing: Standing at sink, Other (Comment) (stedy) Toileting: Dependent (purewick) Where Assessed-Toileting: Bed level Toilet Transfer: Unable to assess Toilet Transfer Method: Unable to assess Tub/Shower Transfer: Unable to assess Tub/Shower Transfer Method: Unable to assess Gaffer Transfer: Unable to assess [employer] Transfer Method: Unable to assess  Therapy/Group: Individual Therapy  Janey Genta 06/16/2022, 1:15 PM

## 2022-06-16 NOTE — Progress Notes (Signed)
Physical Therapy Session Note  Patient Details  Name: Gabriela [name] MRN: [id number] Date of Birth: May 10, 1926  Today's Date: 06/16/2022 PT Individual Time: 0850-1000 PT Individual Time Calculation (min): 70 min   Short Term Goals: Week 2:  PT Short Term Goal 1 (Week 2): STG = LTG due to ELOS  Skilled Therapeutic Interventions/Progress Updates: Pt presented in bed with nsg present agreeable to therapy. Pt c/o mild pain, rest breaks provided during session with nsg administering pain meds during session. While nsg setting up, pt  performed ankle pumps, QS, heel slides and hip abd/add x 8-10 as "warm up". After receiving meds pt noted to be incontinent of bladder. PTA doffed brief with pt then having active BM. Pt able to perform rolling L/R multiple times with use of bed features and minA for positioning to allow PTA to perform peri-care and don new Mepliex at sacrum. Pt able to lift legs to allow PTA to thread brief and pants total A. Pt attempted to perform small bridge to allow hips to clear bed but still required rolling to complete. Pt then performed supine to sit with modA, use of bed features and increased time. At EOB PTA donned shoes total A with pt's granddaughter arriving. Granddaughter asking how progressing with standing with PTA advising that this therapists assisted OT in x 2 stands yesterday with therapists providing modA. Discussed possible caregiver education with Slide board as currently required significant assist with standing. Granddaughter stating will be difficult to have caregivers come in for education as caregivers will rotate as provided by [facility]. PTA did discuss the option of purchasing a sit to stand lift online with PTA providing info at end of session. Pt was able to complete Sit to stand from elevated bed with heavy modA and transferred to w/c via Stedy. In w/c pt was able to scoot posteriorly with supervision and increased time. Pt left in w/c at end of session with belt  alarm on, call bell within reach and needs met.      Therapy Documentation Precautions:  Precautions Precautions: Fall Precaution Comments: HOH Restrictions Weight Bearing Restrictions: Yes RLE Weight Bearing: Weight bearing as tolerated LLE Weight Bearing: Weight bearing as tolerated Other Position/Activity Restrictions: Transfers only General:   Vital Signs: Therapy Vitals Temp: 97.7 F (36.5 C) Temp Source: Oral Pulse Rate: 65 Resp: 20 BP: (!) 117/59 Patient Position (if appropriate): Sitting Oxygen Therapy SpO2: 100 % Pain:   Mobility:   Locomotion :    Trunk/Postural Assessment :    Balance:   Exercises:   Other Treatments:      Therapy/Group: Individual Therapy  Cydnie Deason 06/16/2022, 4:22 PM

## 2022-06-16 NOTE — Progress Notes (Signed)
Physical Therapy Session Note  Patient Details  Name: Gabriela [name] MRN: [id number] Date of Birth: [birth year]  Today's Date: 06/16/2022 PT Individual Time: 0850-1000 PT Individual Time Calculation (min): 70 min   Short Term Goals: Week 1:  PT Short Term Goal 1 (Week 1): Pt will transfer to R w/ min A consistently. PT Short Term Goal 1 - Progress (Week 1): Not met PT Short Term Goal 2 (Week 1): Pt will transfer sit to stand w/ mod A. PT Short Term Goal 2 - Progress (Week 1): Not met PT Short Term Goal 3 (Week 1): Pt will transfer w/c <> bed w/ min A and SB. PT Short Term Goal 3 - Progress (Week 1): Not met PT Short Term Goal 4 (Week 1): PT will assess gait. PT Short Term Goal 4 - Progress (Week 1): Not met Week 2:  PT Short Term Goal 1 (Week 2): STG = LTG due to ELOS Week 3:     Skilled Therapeutic Interventions/Progress Updates:   Pt received sitting in WC and agreeable to PT. Pt transported to rehab gym in [facility]. Seated BLE HS stretch x 2 min each with gentle overpressure. Pt performed hip abduction/adduction 2 x 10 BLE.   Sit>partial stand in parallel bars x 3 with  up to 30 sec hold and BLE blocked with facilitation into improved anterior weight shift.   WC mobility through hall 158f +812fwith cues for doorway management and improved force through BUE to increase momentum and reduce overall energy expenditure.   Seated therex: LAQ, hip flexion, ankle DF each performed x 10 BLE with cues for hold at end range and decreased speed. AAROM on the RLE for LAQ and DF  [facility] to [facility] and all needs met.         Therapy Documentation Precautions:  Precautions Precautions: Fall Precaution Comments: HOH Restrictions Weight Bearing Restrictions: Yes RLE Weight Bearing: Weight bearing as tolerated LLE Weight Bearing: Weight bearing as tolerated Other Position/Activity Restrictions: Transfers only  Vital Signs: Therapy  Vitals Temp: 97.7 F (36.5 C) Temp Source: Oral Pulse Rate: 65 Resp: 20 BP: (!) 117/59 Patient Position (if appropriate): Sitting Oxygen Therapy SpO2: 100 % Pain: Denies at rest    Therapy/Group: Individual Therapy  AuLorie Phenix1/09/2021, 4:23 PM
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
## 2022-06-17 LAB — GLUCOSE, CAPILLARY: Glucose-Capillary: 77 mg/dL (ref 70–99)

## 2022-06-17 MED ORDER — PSEUDOEPHEDRINE HCL ER 120 MG PO TB12
120.0000 mg | ORAL_TABLET | Freq: Two times a day (BID) | ORAL | Status: DC
  Administered 2022-06-17 – 2022-06-20 (×6): 120 mg via ORAL
  Filled 2022-06-17 (×6): qty 1

## 2022-06-17 NOTE — Progress Notes (Signed)
Physical Therapy Session Note  Patient Details  Name: Gabriela [name] MRN: [id number] Date of Birth: 1926-04-17  Today's Date: 06/17/2022 PT Individual Time: 0746-0829 PT Individual Time Calculation (min): 43 min   Short Term Goals: Week 2:  PT Short Term Goal 1 (Week 2): STG = LTG due to ELOS  Skilled Therapeutic Interventions/Progress Updates:     Pt received seated at EOB and agrees to therapy, reporting pain in bilateral knees L>R as well as endorsing that she feels like she cannot "get a good breath". PT provides rest breaks and repositioning to manage pain. Pt performs slideboard transfer from elevated bed to [facility] with maxA/totalA due to slideboard sliding out from under pt due to inadequate clearance of buttocks, and PT manually facilitating squat pivot transfer with pt's trunk. Sitting in [place], pt reports that her shortness of breath symptoms feel a little better. Pants threaded over legs and shoes donned. Pt holds onto back of hard-backed chair and PT provides cues for anterior weight shift and powering up through legs. PT utilizes waistband of pants to facilitate clearance of buttocks and pulling up pants.   Pt's vitals assessed due to SOB symptoms. O2 100% on room air with HR 87 bpm, and BP 124/49.  WC transport to gym. Pt performs Kinetron for gentle AROM and strengthening of bilateral lower extremities. Pt completes at 90 cm/sec, initially with PT providing AAROM, but progressing to completing without external assistance. Pt completes 2x4:00 with extended seated rest break. WC transport back to room. Left seated with alarm intact and all needs within reach.  Therapy Documentation Precautions:  Precautions Precautions: Fall Precaution Comments: HOH Restrictions Weight Bearing Restrictions: Yes RLE Weight Bearing: Weight bearing as tolerated LLE Weight Bearing: Weight bearing as tolerated Other Position/Activity Restrictions: Transfers only   Therapy/Group: Individual  Therapy  Breck Coons, PT, DPT 06/17/2022, 10:23 AM

## 2022-06-17 NOTE — Plan of Care (Signed)
POC adjusted d/t minimal progress  Problem: RH Dressing Goal: LTG Patient will perform lower body dressing w/assist (OT) Description: LTG: Patient will perform lower body dressing with assist, with/without cues in positioning using equipment (OT) Outcome: Not Applicable   Problem: RH Toileting Goal: LTG Patient will perform toileting task (3/3 steps) with assistance level (OT) Description: LTG: Patient will perform toileting task (3/3 steps) with assistance level (OT)  Flowsheets (Taken 06/17/2022 0635) LTG: Pt will perform toileting task (3/3 steps) with assistance level: (at bed level. Downgraded 11/3- SD) Moderate Assistance - Patient 50 - 74%   Problem: RH Toilet Transfers Goal: LTG Patient will perform toilet transfers w/assist (OT) Description: LTG: Patient will perform toilet transfers with assist, with/without cues using equipment (OT) Outcome: Not Applicable   Problem: RH Tub/Shower Transfers Goal: LTG Patient will perform tub/shower transfers w/assist (OT) Description: LTG: Patient will perform tub/shower transfers with assist, with/without cues using equipment (OT) Outcome: Not Applicable   Problem: RH Pre-functional/Other (Specify) Goal: RH LTG OT (Specify) 1 Description: RH LTG OT (Specify) 1 Flowsheets (Taken 06/17/2022 0636) LTG: Other OT (Specify) 1: Pt will perform slideboard transfer <> w/c with min A
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
## 2022-06-17 NOTE — Progress Notes (Signed)
Occupational Therapy Weekly Progress Note  Patient Details  Name: Gabriela [name] MRN: [id number] Date of Birth: [birth year]  Beginning of progress report period: June 10, 2022 End of progress report period: June 17, 2022  Today's Date: 06/17/2022 OT Individual Time: 1000-1100 OT Individual Time Calculation (min): 60 min   Gabriela [name] has progressed slightly this week with standing, still requiring a heavy max-max A +2 assist for very elevated surfaces. She admits fear of falling is contributing, as well as pain and BLE weakness. At this time standing goals are unrealistic in the CIR timeframe. The team has been aggressively working on slideboard transfers and extended discharge date to push for this functional transfer to be performed at home with her hired caregiver and granddaughter's assistance. Gabriela [name] has a supportive granddaughter who she will discharge home with but she is unable to provide the mod-max A level care Gabriela [name] often requires. CSW is attempting to schedule family education in order for family to make an assessment on whether pt can discharge home at this level. She will be performing toileting and LB dressing from bed level. Her slideboard transfers have improved significantly and she is approaching a min A level more consistently.   Patient continues to demonstrate the following deficits: muscle weakness and muscle joint tightness, decreased cardiorespiratoy endurance, delayed processing, and decreased sitting balance, decreased standing balance, decreased postural control, and decreased balance strategies and therefore will continue to benefit from skilled OT intervention to enhance overall performance with BADL and Reduce care partner burden.  Patient progressing toward long term goals..  Plan of care revisions: Slideboard goal added and toileting goals discontinued.  OT Short Term Goals Week 2:  OT Short Term Goal 1 (Week 2): STG = LTG due to ELOS OT Short Term Goal 1 -  Progress (Week 2): Progressing toward goal Week 3:  OT Short Term Goal 1 (Week 3): STG= LTG d/t ELOS  Skilled Therapeutic Interventions/Progress Updates:    Pt received sitting in the w/c with no c/o pain at rest. She was agreeable to ADLs at the sink. She completed oral care with set up assist. UB ADLs at the sink with set up assist. She stood in the stedy with heavy max A of one with mod cueing for positioning of the BUE/BLE. She especially required assist posteriorly to bring her hips forward for stedy seat to be lowered. She required total A for peri hygiene in standing with heavy cueing for technique. She required several rest breaks seated on the elevated stedy seat. Mod-max A to come to standing from the elevated seat. Total A to change brief and pull up pants. She was then brought to the therapy gym. She completed a slideboard transfer from w/c <> mat with min A! Great improvement! She reports she feels safe and successful doing this method of transfer and this is how she would like to transfer at home. She completed 3x10 tricep extension with a level 1 theraband seated with OT facilitating proper technique. She returned to her room. Pt was left sitting up in the w/c with all needs met and call bell within reach.    Therapy Documentation Precautions:  Precautions Precautions: Fall Precaution Comments: HOH Restrictions Weight Bearing Restrictions: Yes RLE Weight Bearing: Weight bearing as tolerated LLE Weight Bearing: Weight bearing as tolerated Other Position/Activity Restrictions: Transfers only  Therapy/Group: Individual Therapy  Curtis Sites 06/17/2022, 6:29 AM

## 2022-06-17 NOTE — Progress Notes (Signed)
Occupational Therapy Session Note  Patient Details  Name: Gabriela [name] MRN: [id number] Date of Birth: 01-10-1926  Today's Date: 06/17/2022 OT Individual Time: 8937-3428 OT Individual Time Calculation (min): 49 min    Short Term Goals: Week 2:  OT Short Term Goal 1 (Week 2): STG = LTG due to ELOS OT Short Term Goal 1 - Progress (Week 2): Progressing toward goal  Skilled Therapeutic Interventions/Progress Updates:    Upon OT arrival, pt seated in w/c requesting her battery holder for hearing aids. Pt reports 7/10 pain to L knee. Pt agreeable to OT treatment. Treatment intervention with a focus on functional transfers, self care retraining, and endurance. Pt returns her hearing aids to the charger with min difficulty. Pt was transported via w/c to dayroom gym and total A and performs squat pivot transfer to mat with Mod A and increased time. While seated EOM, pt completes 1x10 reps of sit to squat transfers with Min A requiring mod verbal and tactile cues to complete. Pt did have one occasion of her hips sliding too far forward requiring max A to assist her back further onto mat. Pt notably fatigued. Pt requesting to return to her room and declines to complete more transfers. Pt was transported back to her room via w/c and total A and changes into her pajama shirt with Setup assist and doffs pants and shoes with Max A seated in w/c. Pt completes squat pivot transfer into bed with Mod A and increased time and completes sit to supine transfer with Mod A. Pt's brief was changed bed level with total A and rolls in bed with SBA using bed rails. Pt was positioned with pillows and left in bed with all needs met.   Therapy Documentation Precautions:  Precautions Precautions: Fall Precaution Comments: HOH Restrictions Weight Bearing Restrictions: Yes RLE Weight Bearing: Weight bearing as tolerated LLE Weight Bearing: Weight bearing as tolerated Other Position/Activity Restrictions: Transfers  only   Therapy/Group: Individual Therapy  [name] 06/17/2022, 4:57 PM

## 2022-06-17 NOTE — Progress Notes (Signed)
Occupational Therapy Session Note  Patient Details  Name: Gabriela [name] MRN: [id number] Date of Birth: 01-29-1926   Today's Date: 06/17/2022 OT Individual Time: 6948-5462 OT Individual Time Calculation (min): 40 min   Short Term Goals: Week 2:  OT Short Term Goal 1 (Week 2): STG = LTG due to ELOS  Skilled Therapeutic Interventions/Progress Updates:   Pt received sitting in the w/c with no c/o pain at rest, agreeable to OT session. Her granddaughter [name] was present initially. Offered family education with the slideboard this session but she declined. Offered to schedule family training Monday or Tuesday in prep for Wednesday discharge but she declined scheduling.  She stated that after speaking with ortho PA, he stated pt would benefit from PT 5x/week. Reiterated that SNF level care could provide this and [name] stated "they don't do what you think they do". She also complained of pt not receiving full therapy on the weekend d/t "staffing" however OT informed this is due to CIR guidelines of 5/7 days of therapy- weekend therapy is provided in addition to medicare requirements. [name] left and pt was taken to the therapy gym. Focus of session on functional transfers. Pt completed a slideboard transfer with mod A to the mat, reduced problem solving and motor planning when getting stuck initially requiring more facilitation. She required extended rest breaks during session 2/2 fatigue. The wheelchair was placed in front of her and she was encouraged to reach for both armrests to lift her hips off the mat. She was able to do so with min A! She completed 2x5 trials with about 5 inches of clearance. Carried this over to a squat pivot and pt was able to complete with mod A. Great progress today! Pt returned to her room and was left sitting up with all needs met.   Therapy Documentation Precautions:  Precautions Precautions: Fall Precaution Comments: HOH Restrictions Weight Bearing Restrictions:  Yes RLE Weight Bearing: Weight bearing as tolerated LLE Weight Bearing: Weight bearing as tolerated Other Position/Activity Restrictions: Transfers only   Therapy/Group: Individual Therapy  Curtis Sites 06/17/2022, 6:28 AM

## 2022-06-18 DIAGNOSIS — K5901 Slow transit constipation: Secondary | ICD-10-CM

## 2022-06-18 DIAGNOSIS — I10 Essential (primary) hypertension: Secondary | ICD-10-CM

## 2022-06-18 LAB — GLUCOSE, CAPILLARY: Glucose-Capillary: 73 mg/dL (ref 70–99)
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
## 2022-06-19 LAB — GLUCOSE, CAPILLARY: Glucose-Capillary: 88 mg/dL (ref 70–99)

## 2022-06-19 NOTE — Progress Notes (Signed)
Occupational Therapy Session Note  Patient Details  Name: Gabriela [name] MRN: [id number] Date of Birth: [birth year]  Today's Date: 06/19/2022 OT Individual Time: 1300-1400 OT Individual Time Calculation (min): 60 min    Short Term Goals: Week 2:  OT Short Term Goal 1 (Week 2): STG = LTG due to ELOS OT Short Term Goal 1 - Progress (Week 2): Progressing toward goal  Skilled Therapeutic Interventions/Progress Updates:    Upon OT arrival, pt seated in w/c stating "Are we going to work my arms out?". Pt agreeable to OT treatment. Treatment intervention with a focus on strengthening, weight shifting to facilitate functional transfers, and functional reaching. Pt was transported to dayroom via w/c and total A. Bilateral wrist weights donned of 1.5lb each. Pt retrieves playing cards from velcro board one at a time completing repetitive reaching and pt hands to therapist stating what each card is. Pt requires min verbal and visual cues for correct technique and desired movement pattern to improve weight shifting for transfers. Pt then places all cards back onto the velcro boards one at a time with Min difficulty but tolerates activity well. Pt was transported back to her room via w/c and total A. Pt doffs shirt independently and donns night gown with setup. Pt completes slideboard transfer from w/c to bed with Min A and completes sit to supine transfer with Mod A. Pt's pants and shoes doffed dependently. Pt was left in bed at end of session with all needs met.   Therapy Documentation Precautions:  Precautions Precautions: Fall Precaution Comments: HOH Restrictions Weight Bearing Restrictions: Yes RLE Weight Bearing: Weight bearing as tolerated LLE Weight Bearing: Weight bearing as tolerated Other Position/Activity Restrictions: Transfers only    Therapy/Group: Individual Therapy  [name] 06/19/2022, 1:37 PM

## 2022-06-19 NOTE — Progress Notes (Signed)
Physical Therapy Session Note  Patient Details  Name: Gabriela [name] MRN: [id number] Date of Birth: [birth year]  Today's Date: 06/19/2022 PT Individual Time: 0920-1020 PT Individual Time Calculation (min): 60 min   Short Term Goals: Week 2:  PT Short Term Goal 1 (Week 2): STG = LTG due to ELOS  Skilled Therapeutic Interventions/Progress Updates:    Pt received sitting EOB with her granddaughter, Gabriela [name], present and pt agreeable to therapy session. Family aware of current 11/8 planned D/C date and granddaughter reports their current plan is for [facility] follow-up therapies and to have hired support as additional assistance because this granddaughter lives in [place].   Pt's granddaughter reports that at home pt gets OOB on the R side due to pt having hx of "a bad R hip" at baseline with pt having to lift her R LE using her UEs when sitting to reposition and move her foot.  Therapist discussed recommendation for a hospital bed at D/C base on OT's note recommending pt be bed level for toileting and perform bed<>wheelchair slide board transfers to decrease burden of care on family/caregivers and allow pt increased independence with functional mobility. Followed up on discussion initiated by PTA of possibility of getting a stedy to allow pt progression into standing and educated family that this is not covered by insurance. Pt's granddaughter states they would have to see if this would be feasible for them to purchase or not.  Pt performed R lateral scoot transfer EOB>w/c using transfer board with therapist providing education and demonstration on proper board placement and how to safely assist pt with the transfer - total assist to place the board and then min assist to scoot pt's hips across board with assist to stabilize the board for safety due to airbed and Roho cushion - cuing for pt to keep trunk leaned forward and pt requiring increased time and many, small scoots to complete the transfer -  therapist assisting with repositioning LEs as needed during transfer.  Pt completed additional bed<>wheelchair transfer using transfer board with her granddaughter providing hands-on assistance and therapist providing verbal instruction as needed to ensure safe set-up and sequencing - pt's granddaughter providing all hands-on assistance with therapist providing close supervision/CGA. Pt's granddaughter reports feeling comfortable performing this transfer at home and after seeing pt stand in the stedy is in agreement that pt is not yet safe to stand with a RW - pt's granddaughter would benefit from 1 additional training session on slide board transfers prior to pt's D/C if possible to ensure understanding.  Pt agreeable to perform sit>stand and requests to use the stedy as opposed to // bars.  Sit>stand w/c>B UE support on stedy with heavy max/total assist to lift into standing and pt demoing impaired motor planning/sequencing to intitate coming to stand requiring max cuing to lean trunk forward and attempt to push up through B LEs - pt with significant R hip adduction causing poor LE alignment for WBing, likely due to pt's hx of impaired R hip strength that has been exacerbated from current injuries.  Sat on stedy seat and pt reports it feels "good." Sit>stand from stedy seat x2 with heavy mod assist for lifting into more upright standing with max cuing to bring trunk upright and bring hips forward towards stedy bar with mirror feedback provided. Pt tolerated standing ~15-20seconds.  At end of sesision, pt left seated in w/c with needs in reach, seat belt alarm on, and her graddaughter present.   Therapy Documentation Precautions:  Precautions  Precautions: Fall Precaution Comments: HOH Restrictions Weight Bearing Restrictions: Yes RLE Weight Bearing: Weight bearing as tolerated LLE Weight Bearing: Weight bearing as tolerated Other Position/Activity Restrictions: Transfers only   Pain: Pt  grimaces with R LE and L LE movements indicating pain/discomfort (reports typically L LE is more painful than R), but despite this pt is highly motivated to continue working and would often state "it's okay" after grimacing when therapist questions about pain.  Therapy/Group: Individual Therapy  Tawana Scale , PT, DPT, NCS, CSRS 06/19/2022, 7:49 AM

## 2022-06-20 ENCOUNTER — Inpatient Hospital Stay (HOSPITAL_COMMUNITY): Payer: Medicare Other

## 2022-06-20 LAB — BASIC METABOLIC PANEL
Anion gap: 9 (ref 5–15)
BUN: 40 mg/dL — ABNORMAL HIGH (ref 8–23)
CO2: 18 mmol/L — ABNORMAL LOW (ref 22–32)
Calcium: 8.6 mg/dL — ABNORMAL LOW (ref 8.9–10.3)
Chloride: 115 mmol/L — ABNORMAL HIGH (ref 98–111)
Creatinine, Ser: 1.38 mg/dL — ABNORMAL HIGH (ref 0.44–1.00)
GFR, Estimated: 35 mL/min — ABNORMAL LOW (ref >60)
Glucose, Bld: 89 mg/dL (ref 70–99)
Potassium: 5.3 mmol/L — ABNORMAL HIGH (ref 3.5–5.1)
Sodium: 142 mmol/L (ref 135–145)

## 2022-06-20 LAB — MAGNESIUM: Magnesium: 2.3 mg/dL (ref 1.7–2.4)

## 2022-06-20 LAB — CBC
HCT: 23 % — ABNORMAL LOW (ref 36.0–46.0)
Hemoglobin: 7.4 g/dL — ABNORMAL LOW (ref 12.0–15.0)
MCH: 27.2 pg (ref 26.0–34.0)
MCHC: 32.2 g/dL (ref 30.0–36.0)
MCV: 84.6 fL (ref 80.0–100.0)
Platelets: UNDETERMINED 10*3/uL (ref 150–400)
RBC: 2.72 MIL/uL — ABNORMAL LOW (ref 3.87–5.11)
RDW: 19.5 % — ABNORMAL HIGH (ref 11.5–15.5)
WBC: 6.2 10*3/uL (ref 4.0–10.5)
nRBC: 0 % (ref 0.0–0.2)

## 2022-06-20 LAB — GLUCOSE, CAPILLARY: Glucose-Capillary: 80 mg/dL (ref 70–99)

## 2022-06-20 MED ORDER — SODIUM ZIRCONIUM CYCLOSILICATE 5 G PO PACK
5.0000 g | PACK | Freq: Once | ORAL | Status: AC
  Administered 2022-06-20: 5 g via ORAL
  Filled 2022-06-20: qty 1

## 2022-06-20 MED ORDER — FAMOTIDINE 20 MG PO TABS: 20.0000 mg | ORAL_TABLET | Freq: Every day | ORAL | Status: DC

## 2022-06-20 NOTE — Progress Notes (Addendum)
Patient ID: Gabriela [name], female   DOB: 18-May-1926, 88 y.o.   MRN: [id number]  [facility] referral sent to [facility] Patient approved by Caryl Pina
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
## 2022-06-20 NOTE — Progress Notes (Signed)
Occupational Therapy Session Note  Patient Details  Name: Gabriela [name] MRN: [id number] Date of Birth: [birth year]  Today's Date: 06/20/2022 OT Individual Time: 1549-1630 OT Individual Time Calculation (min): 41 min    Short Term Goals: Week 2:  OT Short Term Goal 1 (Week 2): STG = LTG due to ELOS OT Short Term Goal 1 - Progress (Week 2): Progressing toward goal  Skilled Therapeutic Interventions/Progress Updates:    Upon OT arrival, pt off floor in xray. Pt missed 19 minutes of OT treatment. Once arrived, pt initially declining OT services but with encouragement was agreeable. Pt completes supine to sit transfer with Min A for R LE management. While seated EOB, pt engages in LB dressing retraining. Pt requires Mod A to doff/donn shoes with increased time and effort. Rest breaks required. Pt sits EOB independently to perform B UE exercises using 1lb dumbbell for 2x10 reps of shoulder flex/ext, shoulder abd/add, elbow flex/ext, and scapular protr/retr. Pt requires verbal cues for proper form. Pt tolerates exercises well. Pt completes sit to supine transfer with Mod A and attempts to doff pants bed level but requires Max A to doff. Pt was repositioned in bed and left in bed at end of session with all needs met.   Therapy Documentation Precautions:  Precautions Precautions: Fall Precaution Comments: HOH Restrictions Weight Bearing Restrictions: No RLE Weight Bearing: Weight bearing as tolerated LLE Weight Bearing: Weight bearing as tolerated Other Position/Activity Restrictions: Transfers only General: General OT Amount of Missed Time: 19 Minutes    Therapy/Group: Individual Therapy  [name] 06/20/2022, 4:19 PM

## 2022-06-20 NOTE — Consult Note (Addendum)
[place] Nurse wound follow up Wound type: Right heel with dark red-purple Deep tissue pressure injurry; remains with intact skin, no open wound or drainage.  2.5X3cm.   Present on admission: No Continue present plan of care as follows: Wound care to Deep tissue pressure injury to right heel DTPI: Cleanse with NS, pat gently dry. Cover with silicone foam dressing for heel. Change Q 3 days or PRN rolling of dressing edges or dressing dislodgement. Float right heel to reduce pressure when in bed. [place] team will reassess next week to determine if a change in the plan of care is indicated at that time. Thank-you,  Julien Girt MSN, [place], [place], [place], [place]

## 2022-06-20 NOTE — Progress Notes (Signed)
Occupational Therapy Session Note  Patient Details  Name: Gabriela [name] MRN: [id number] Date of Birth: [birth year]  Today's Date: 06/20/2022 OT Individual Time: 8546-2703 OT Individual Time Calculation (min): 65 min    Short Term Goals: Week 3:  OT Short Term Goal 1 (Week 3): STG= LTG d/t ELOS  Skilled Therapeutic Interventions/Progress Updates:    Pt received supine with no c/o pain, agreeable to OT session. Pain did increase to 6/10 with bed mobility so adequate rest breaks were provided and pt not requesting any further pain management strategies. She completed bed mobility R and L with (S) using bed rails for removal of incontinence brief (saturated with urine). She was able to thoroughly perform anterior peri hygiene supine and required mod A for posterior. Depends donned with max A but pt able to bridge hips slightly to assist with bringing over her hips and then roll side to side to get up completely. She performed UB from bed level as well and required frequent rest breaks. She came to EOB to don pants per her request. Shirt donned with (S). She was encouraged to lean forward to attempt and thread pants but was unable and declined trying further. OT threaded pants and then with BUE support on the w/c forward pt was able to lift her hips enough for OT to pull pants up over the back with max A. Pt reporting being very hot and breathing not feeling "right". Vitals assessed- SpO2 97%, HR 104 bpm and BP 185/88 L arm and 180/85 R arm. LPN in room administering morning medications and monitoring pt as well. Offered to help pt lay back down but she stated "no I want to sit up". She requested a long break before attempting to get to the chair. 10 min break given with pt sitting EOB. She was also given an applesauce and yogurt to encourage increased PO intake. Rechecked BP and it remained elevated at 173/ 80. Pt completed a slideboard transfer to the w/c, deferring squat pivot d/t fatigue. Pt was left  sitting up in the w/c with all needs met and call bell within reach.   LPN pushing fluids for BP support and notified PA of elevated BP.     Therapy Documentation Precautions:  Precautions Precautions: Fall Precaution Comments: HOH Restrictions Weight Bearing Restrictions: No RLE Weight Bearing: Weight bearing as tolerated LLE Weight Bearing: Weight bearing as tolerated Other Position/Activity Restrictions: Transfers only   Therapy/Group: Individual Therapy  Curtis Sites 06/20/2022, 6:48 AM

## 2022-06-20 NOTE — Progress Notes (Signed)
Physical Therapy Session Note  Patient Details  Name: Gabriela [name] MRN: [id number] Date of Birth: [birth year]  Today's Date: 06/20/2022 PT Individual Time: 8022-3361 PT Individual Time Calculation (min): 71 min   Short Term Goals: Week 1:  PT Short Term Goal 1 (Week 1): Pt will transfer to R w/ min A consistently. PT Short Term Goal 1 - Progress (Week 1): Not met PT Short Term Goal 2 (Week 1): Pt will transfer sit to stand w/ mod A. PT Short Term Goal 2 - Progress (Week 1): Not met PT Short Term Goal 3 (Week 1): Pt will transfer w/c <> bed w/ min A and SB. PT Short Term Goal 3 - Progress (Week 1): Not met PT Short Term Goal 4 (Week 1): PT will assess gait. PT Short Term Goal 4 - Progress (Week 1): Not met Week 2:  PT Short Term Goal 1 (Week 2): STG = LTG due to ELOS  Skilled Therapeutic Interventions/Progress Updates:   Received pt sitting in [facility] with granddaughter present at bedside with RN and wound care RN present at bedside - reporting pt's BP elevated this morning. Pt agreeable to PT treatment and denied any pain but reported having chest congestion. Session with emphasis on functional mobility/transfers, family education, generalized strengthening and endurance, and standing tolerance. Allowed increased time for pt to calm down and BP reassessed: 170/77 (trial 1) and 166/73 (trial 2) - HR 86bpm and SPO2 96%.  PA, [name], present for brief assessment and ordered chest x-ray due to chest tightness/congestion. Pt's granddaughter reported not having practiced with slideboard transfers and requesting to do so. Pt transported to/from room in [facility] dependently for time management purposes. Pt transferred WC<>mat via slideboard with min A and total A to place board - cues for head/hips relationship and anterior weight shifting. Pt then worked on sit<>stands x 3 reps from elevated mat with RW and max A +2 - cues to get feet underneath her, hand placement, and to extend knees in standing. Educated  pt/granddaughter on recommendation for slideboard to be primary transfer method, due to difficulty standing - both verbalized understanding. Pt transferred mat<>WC via slideboard with CGA provided by granddaughter (total A to place and remove board). Returned to room and discussed DME (ordering pt hospital bed, slideboard, and 16x16 manual WC) as well as HHPT options. Pt's granddaughter with questions regarding ordering [name] - informed her that [name] is a good way to practice standing but emphasized using slideboard as primary transfer method at this time. Concluded session with pt sitting in [facility] with all needs within reach and granddaughter present at bedside.   Therapy Documentation Precautions:  Precautions Precautions: Fall Precaution Comments: HOH Restrictions Weight Bearing Restrictions: No RLE Weight Bearing: Weight bearing as tolerated LLE Weight Bearing: Weight bearing as tolerated Other Position/Activity Restrictions: Transfers only  Therapy/Group: Individual Therapy Alfonse Alpers PT, DPT  06/20/2022, 7:02 AM

## 2022-06-20 NOTE — Progress Notes (Signed)
Patient ID: Gabriela [name], female   DOB: 1925-12-04, 88 y.o.   MRN: [id number]  HB and Drop Arm Commode ordered through [employer].

## 2022-06-20 NOTE — Progress Notes (Signed)
Patient ID: LOVEAH LIKE, female   DOB: 01/31/1926, 88 y.o.   MRN: [id number]  Sw received notification of patient detailed notice of non-coverage (DNOD) issues and delivered to family on 11/5.  Sw received notification of family's expedited appeal request from [facility]. Appeal documents electronically submitted.

## 2022-06-21 ENCOUNTER — Other Ambulatory Visit (HOSPITAL_COMMUNITY): Payer: Self-pay

## 2022-06-21 ENCOUNTER — Telehealth: Payer: Self-pay

## 2022-06-21 DIAGNOSIS — N1832 Chronic kidney disease, stage 3b: Secondary | ICD-10-CM

## 2022-06-21 DIAGNOSIS — E875 Hyperkalemia: Secondary | ICD-10-CM

## 2022-06-21 DIAGNOSIS — L89309 Pressure ulcer of unspecified buttock, unspecified stage: Secondary | ICD-10-CM | POA: Insufficient documentation

## 2022-06-21 DIAGNOSIS — L8995 Pressure ulcer of unspecified site, unstageable: Secondary | ICD-10-CM | POA: Insufficient documentation

## 2022-06-21 DIAGNOSIS — D72829 Elevated white blood cell count, unspecified: Secondary | ICD-10-CM

## 2022-06-21 LAB — GLUCOSE, CAPILLARY
Glucose-Capillary: 78 mg/dL (ref 70–99)
Glucose-Capillary: 113 mg/dL — ABNORMAL HIGH (ref 70–99)

## 2022-06-21 LAB — BASIC METABOLIC PANEL WITH GFR
Anion gap: 13 (ref 5–15)
BUN: 43 mg/dL — ABNORMAL HIGH (ref 8–23)
CO2: 19 mmol/L — ABNORMAL LOW (ref 22–32)
Calcium: 8.9 mg/dL (ref 8.9–10.3)
Chloride: 108 mmol/L (ref 98–111)
Creatinine, Ser: 1.36 mg/dL — ABNORMAL HIGH (ref 0.44–1.00)
GFR, Estimated: 36 mL/min — ABNORMAL LOW
Glucose, Bld: 132 mg/dL — ABNORMAL HIGH (ref 70–99)
Potassium: 4.6 mmol/L (ref 3.5–5.1)
Sodium: 140 mmol/L (ref 135–145)

## 2022-06-21 MED ORDER — METHOCARBAMOL 500 MG PO TABS
500.0000 mg | ORAL_TABLET | Freq: Four times a day (QID) | ORAL | 0 refills | Status: DC | PRN
  Filled 2022-06-21: qty 30, 8d supply, fill #0

## 2022-06-21 MED ORDER — OXYCODONE HCL 5 MG PO TABS
5.0000 mg | ORAL_TABLET | ORAL | 0 refills | Status: DC | PRN
  Filled 2022-06-21: qty 35, 6d supply, fill #0

## 2022-06-21 MED ORDER — DICLOFENAC SODIUM 1 % EX GEL
2.0000 g | Freq: Four times a day (QID) | CUTANEOUS | 0 refills | Status: DC
  Filled 2022-06-21: qty 200, 25d supply, fill #0

## 2022-06-21 MED ORDER — ASPIRIN 81 MG PO TBEC
81.0000 mg | DELAYED_RELEASE_TABLET | Freq: Every day | ORAL | 0 refills | Status: DC
  Filled 2022-06-21: qty 30, 30d supply, fill #0

## 2022-06-21 MED ORDER — VITAMIN D (ERGOCALCIFEROL) 1.25 MG (50000 UNIT) PO CAPS
50000.0000 [IU] | ORAL_CAPSULE | ORAL | 0 refills | Status: DC
  Filled 2022-06-21: qty 5, 35d supply, fill #0

## 2022-06-21 MED ORDER — LORATADINE 10 MG PO TABS
10.0000 mg | ORAL_TABLET | Freq: Every day | ORAL | 0 refills | Status: DC
  Filled 2022-06-21: qty 30, 30d supply, fill #0

## 2022-06-21 MED ORDER — ACETAMINOPHEN 325 MG PO TABS: 650.0000 mg | ORAL_TABLET | Freq: Three times a day (TID) | ORAL | 0 refills | Status: DC

## 2022-06-21 MED ORDER — CALCIUM CARBONATE ANTACID 500 MG PO CHEW
400.0000 mg | CHEWABLE_TABLET | Freq: Every day | ORAL | 0 refills | Status: DC
  Filled 2022-06-21: qty 60, 30d supply, fill #0

## 2022-06-21 MED ORDER — POLYETHYLENE GLYCOL 3350 17 GM/SCOOP PO POWD
17.0000 g | Freq: Every day | ORAL | 0 refills | Status: DC
  Filled 2022-06-21: qty 238, 14d supply, fill #0

## 2022-06-21 MED ORDER — FERROUS SULFATE 325 (65 FE) MG PO TABS
325.0000 mg | ORAL_TABLET | Freq: Every day | ORAL | 0 refills | Status: DC
  Filled 2022-06-21: qty 30, 30d supply, fill #0

## 2022-06-21 NOTE — Progress Notes (Signed)
Physical Therapy Session Note  Patient Details  Name: Gabriela [name] MRN: [id number] Date of Birth: 1926/06/03  Today's Date: 06/21/2022 PT Individual Time: 6761-9509 and 3267-1245 PT Individual Time Calculation (min): 72 min and 55 min  Short Term Goals: Week 1:  PT Short Term Goal 1 (Week 1): Pt will transfer to R w/ min A consistently. PT Short Term Goal 1 - Progress (Week 1): Not met PT Short Term Goal 2 (Week 1): Pt will transfer sit to stand w/ mod A. PT Short Term Goal 2 - Progress (Week 1): Not met PT Short Term Goal 3 (Week 1): Pt will transfer w/c <> bed w/ min A and SB. PT Short Term Goal 3 - Progress (Week 1): Not met PT Short Term Goal 4 (Week 1): PT will assess gait. PT Short Term Goal 4 - Progress (Week 1): Not met Week 2:  PT Short Term Goal 1 (Week 2): STG = LTG due to ELOS  Skilled Therapeutic Interventions/Progress Updates:   Treatment Session 1 Received pt semi-reclined in bed, pt agreeable to PT treatment, and reported pain 7/10 in bilateral knees - RN notified of pt's request for pain medicine. Session with emphasis on bathing/dressing, functional mobility/transfers, D/C planning, and endurance. Went through pain interference questionnaire, sensation, and MMT then pt requested therapist "give her a minute" before getting dressed, stating that when she moves too quickly she gets SOB.   During extended rest break, reviewed results of chest x-ray from yesterday - unremarkable. Pt requested to wash face from bed level - did so with set up assist. Pt then requested to wash up, declining waiting until OT session. Removed soiled brief with total A and pt suddenly requested to sit EOB due to difficulty breathing - transferred semi-reclined<>sitting EOB with mod A for BLE management and trunk elevation. Doffed dirty shirt mod I and pt washed and dried lower body sitting EOB with distant supervision. Took another extended rest break then donned clean shirt with set up assist.  Returned to supine with mod A and pt rolled L/R with supervision and use of bedrails for therapist to don clean brief and pants with total A. Returned to sitting EOB, again with mod A, and required extensive seated rest break to recover. BP assessed: 167/88. Donned shoes with max A, took another rest break, then transferred bed<>WC via slideboard with max A initially to clear buttocks, then pt able to perform mini scoots with CGA remainder of way with rest breaks mid-transfer. Concluded session with pt sitting in [facility] with all needs within reach and set up to eat breakfast. Granddaughter arrived at end of session with questions regarding ordering DME - referred to [place].   Treatment Session 2 Received pt semi-reclined in bed, pt agreeable to PT treatment, and denied any pain at rest but reported fatigue stating "you just don't know how tired I am". Session with emphasis on functional mobility/transfers, WC mobility, and generalized strengthening and endurance. Pt transferred semi-reclined<>sitting EOB with HOB elevated and use of bedrails with heavy min A and scooted to EOB with min A due to fatigue. Pt transferred bed<>WC via slideboard with mod A overall this afternoon as pt unable to lift buttocks and wearing herself out attempting numerous mini-scoots - poor carry over with cues for anterior weight shifting. Pt transported to main therapy gym in [facility] dependently and worked on [facility] mobility 58f x 1 and 536fx 1 using BUE and supervision with emphasis on UE strength and cardiovascular endurance.  Pt then performed seated AAROM BLE strengthening on Kinetron at 40 cm/sec for 30 seconds x 4 trials with emphasis on glute/quad strength with rest breaks in between. Pt then performed the following exercises with emphasis on LE strength/ROM: -LAQ 2x10 bilaterally - decreased ROM R>L -hip flexion 2x8 bilaterally - decreased ROM R>L Returned to room and concluded session with pt sitting in WC with all needs within reach.    Therapy Documentation Precautions:  Precautions Precautions: Fall Precaution Comments: HOH Restrictions Weight Bearing Restrictions: No RLE Weight Bearing: Weight bearing as tolerated LLE Weight Bearing: Weight bearing as tolerated Other Position/Activity Restrictions: Transfers only  Therapy/Group: Individual Therapy Alfonse Alpers PT, DPT  06/21/2022, 7:05 AM

## 2022-06-21 NOTE — Progress Notes (Signed)
Occupational Therapy Discharge Summary  Patient Details  Name: Gabriela [name] MRN: [id number] Date of Birth: 12/29/1925  Date of Discharge from OT service:June 21, 2022   Patient has met 3 of 4 long term goals due to improved activity tolerance, improved balance, postural control, ability to compensate for deficits, improved awareness, and improved coordination.  Patient to discharge at overall Min Assist level for transfers and mod-max A for self care.  Patient's granddaughter Gabriela [name] is independent to provide the necessary physical assistance at discharge. Family education has been completed with [name] She declined practicing bed level self care as she feels comfortable with this. She practiced slideboard transfers as well as closely observed stedy transfers. Provided education on stedy progression and need for occasional heavy max A to come to standing. Slideboard transfers are frequently CGA- (S) but with uphill she occasionally needs min A.   Reasons goals not met: Patient still requires mod A for bed level bathing.   Recommendation:  Patient will benefit from ongoing skilled OT services in home health setting to continue to advance functional skills in the area of BADL and iADL.  Equipment: BSC, slideboard, hospital bed, hoyer  Reasons for discharge: treatment goals met and discharge from hospital  Patient/family agrees with progress made and goals achieved: Yes  OT Discharge Precautions/Restrictions  Precautions Precautions: Fall Precaution Comments: HOH Restrictions Weight Bearing Restrictions: Yes RLE Weight Bearing: Weight bearing as tolerated LLE Weight Bearing: Weight bearing as tolerated   Pain Pain Assessment Pain Scale: 0-10 Pain Score: 0-No pain ADL ADL Eating: Independent Where Assessed-Eating: Edge of bed Grooming: Independent Where Assessed-Grooming: Sitting at sink Upper Body Bathing: Supervision/safety Where Assessed-Upper Body Bathing: Sitting at  sink Lower Body Bathing: Moderate assistance Where Assessed-Lower Body Bathing: Bed level Upper Body Dressing: Supervision/safety Where Assessed-Upper Body Dressing: Sitting at sink Lower Body Dressing: Maximal assistance Where Assessed-Lower Body Dressing: Bed level Toileting: Maximal assistance Where Assessed-Toileting: Bed level Toilet Transfer: Maximal assistance Toilet Transfer Method: Transfer board Tub/Shower Transfer: Unable to assess Tub/Shower Transfer Method: Unable to assess Walk-In Shower Transfer: Unable to assess Walk-In Shower Transfer Method: Unable to assess Vision Baseline Vision/History: 1 Wears glasses Patient Visual Report: No change from baseline Vision Assessment?: No apparent visual deficits Perception  Perception: Within Functional Limits Praxis Praxis: Intact Cognition Cognition Overall Cognitive Status: Within Functional Limits for tasks assessed Arousal/Alertness: Awake/alert Orientation Level: Person;Place;Situation Person: Oriented Place: Oriented Situation: Oriented Memory: Impaired Memory Impairment: Decreased short term memory;Decreased recall of new information Decreased Short Term Memory: Verbal complex;Functional complex Awareness: Appears intact Problem Solving: Impaired Problem Solving Impairment: Verbal complex;Functional complex Safety/Judgment: Appears intact Comments: fearful with movement Brief Interview for Mental Status (BIMS) Repetition of Three Words (First Attempt): 3 Temporal Orientation: Year: Correct Temporal Orientation: Month: Accurate within 5 days Temporal Orientation: Day: Incorrect Recall: "Sock": No, could not recall Recall: "Blue": Yes, no cue required Recall: "Bed": Yes, after cueing ("a piece of furniture") BIMS Summary Score: 11 Sensation Sensation Light Touch: Appears Intact Hot/Cold: Appears Intact Proprioception: Appears Intact Additional Comments: reports numbness along surgical  sites Coordination Gross Motor Movements are Fluid and Coordinated: No Fine Motor Movements are Fluid and Coordinated: No Coordination and Movement Description: uncoordinated due to generalized weakness/deconditioning, fear of movement, and decreased standing balance/coordination Motor  Motor Motor: Other (comment) Motor - Discharge Observations: uncoordinated due to generalized weakness/deconditioning, fear of movement, and decreased standing balance/coordination Mobility  Bed Mobility Bed Mobility: Rolling Right;Right Sidelying to Sit Rolling Right: Supervision/verbal cueing Rolling Left: Supervision/Verbal cueing Right   Sidelying to Sit: Minimal Assistance - Patient > 75% Sitting - Scoot to Edge of Bed: Minimal Assistance - Patient > 75% Scooting to HOB: Minimal Assistance - Patient > 75% Transfers Sit to Stand: Dependent - mechanical lift Stand to Sit: Dependent - mechanical lift  Trunk/Postural Assessment  Cervical Assessment Cervical Assessment: Exceptions to [facility] (forward head) Thoracic Assessment Thoracic Assessment: Exceptions to [facility] (rounded shoulders) Lumbar Assessment Lumbar Assessment: Exceptions to [facility] (posterior pelvic tilt) Postural Control Postural Control: Deficits on evaluation Righting Reactions: delayed  Balance Balance Balance Assessed: Yes Static Sitting Balance Static Sitting - Balance Support: Feet supported;No upper extremity supported Static Sitting - Level of Assistance: 5: Stand by assistance Dynamic Sitting Balance Dynamic Sitting - Balance Support: Feet supported;No upper extremity supported Dynamic Sitting - Level of Assistance: 5: Stand by assistance Static Standing Balance Static Standing - Balance Support: During functional activity;Bilateral upper extremity supported Static Standing - Level of Assistance: 4: Min assist (in the stedy) Extremity/Trunk Assessment RUE Assessment RUE Assessment: Exceptions to [facility] Passive Range of Motion (PROM)  Comments: Limited to 120 degrees shoulder flexion Active Range of Motion (AROM) Comments: Limted to 100 degrees shoulder flexion General Strength Comments: Grossly 3-/5, 4-/5 distally, grasp WFL LUE Assessment LUE Assessment: Exceptions to [facility] Passive Range of Motion (PROM) Comments: Limited to 120 degrees shoulder flexion Active Range of Motion (AROM) Comments: Limited to 100 degrees shoulder flexion General Strength Comments: Grossly 3-/5, 4-/5 distally, grasp WFL   Curtis Sites 06/21/2022, 12:20 PM

## 2022-06-21 NOTE — Progress Notes (Signed)
Occupational Therapy Session Note  Patient Details  Name: Gabriela [name] MRN: [id number] Date of Birth: [birth year]  Today's Date: 06/21/2022 OT Individual Time: 3329-5188 OT Individual Time Calculation (min): 84 min    Short Term Goals: Week 3:  OT Short Term Goal 1 (Week 3): STG= LTG d/t ELOS  Skilled Therapeutic Interventions/Progress Updates:    Family education session completed with pt and her granddaughter [name]. Verbal education provided re fall risk reduction, energy conservation strategies, home carryover of transfer training, ADLs, and IADLs. [name] declined practicing self care from bed level, stating she feels comfortable with this. Focus of family training on ADL transfers and progression of standing at home. Long discussion re DME for home. [name] thinks she will likely privately buy a stedy. Pt received in the w/c and reported "a little" and could not rate her knee pain. She was given very long rest breaks throughout session for pain management and for fatigue. She was taken via w/c to the therapy gym. She completed a slideboard transfer with min A with extra time d/t fatigue and problem solving with it being slightly uphill. She sat EOM unsupported with (S). Mat elevated and pt able to stand in stedy with heavy mod A. Provided education on body mechanics and caregiver safety using stedy. Discussed progression of standing. She sat perched on the stedy with (S). She completed 2x sit <> stand from the stedy seat with only min A. Great improvement in standing today. She returned to the mat and required another extended rest break. [name] then assisted the pt in positioning slideboard and transfer to the w/c. She did great and was able to demonstrate assisting pt safely and with good caregiver body mechanics. She returned to her room. Pt was assisted back to bed and was transferred back to bed for nursing care. Reviewed w/c folding/features with [name]. Followed up with PA and CSW on discharge  planning. She was left supine in LPN's care.    Therapy Documentation Precautions:  Precautions Precautions: Fall Precaution Comments: HOH Restrictions Weight Bearing Restrictions: No RLE Weight Bearing: Weight bearing as tolerated LLE Weight Bearing: Weight bearing as tolerated Other Position/Activity Restrictions: Transfers only   Therapy/Group: Individual Therapy  Curtis Sites 06/21/2022, 6:36 AM

## 2022-06-21 NOTE — Telephone Encounter (Signed)
Copied from [place] [id number]. Topic: Referral - Request for Referral >> Jun 21, 2022 10:07 AM Sabas Sous wrote: Has patient seen PCP for this complaint? Yes.   *If NO, is insurance requiring patient see PCP for this issue before PCP can refer them? Referral for which specialty: Nephrology  Preferred provider/office: Hig[facility] recommended  Reason for referral: Has chronic kidney disease
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
## 2022-06-21 NOTE — TOC Transition Note (Signed)
Discharge medications left with Granddaughter to be taken home for pt planned discharge 06/22/22.

## 2022-06-21 NOTE — Progress Notes (Signed)
Patient ID: Gabriela [name], female   DOB: 08-Jan-1926, 88 y.o.   MRN: [id number]  ROHO cushion , WC, Slide Board, Hoyer, HC and drop arm commode ordered through [employer].  Patient [place] set with [facility] transport will be scheduled based on HB delivery  D/C address confirmed: [address]  [place] [address]   Granddaughter [name] will take pt belongings.

## 2022-06-21 NOTE — NC FL2 (Signed)
[place] LEVEL OF CARE SCREENING TOOL     IDENTIFICATION  Patient Name: Gabriela [name] Birthdate: 02-24-1926 Sex: female Admission Date (Current Location): 06/02/2022  [place] and [place] Number:  Selena Lesser [id number] [place] and Address:  [facility] [place]. [facility], [place] [address] [place], [place] [address]      Provider Number:    Attending Physician Name and Address:  Izora Ribas, MD  Relative Name and Phone Number:  [name] [phone]    Current Level of Care: SNF Recommended Level of Care: [place] Prior Approval Number:    Date Approved/Denied:   PASRR Number:    Discharge Plan: Home    Current Diagnoses: Patient Active Problem List   Diagnosis Date Noted   Pressure injury of skin of ischial area 06/21/2022   Pressure ulcer with suspected deep tissue injury, unstageable ([place]) 06/21/2022   Oth fracture of unsp femur, init encntr for closed fracture ([place]) 06/02/2022   Fever 05/29/2022   Leukocytosis 05/29/2022   Femur fracture, right ([place]) 05/26/2022   Femur fracture, left ([place]) 05/26/2022   CKD (chronic kidney disease) stage 4, GFR 15-29 ml/min ([place]) 05/26/2022   Comminuted fracture of right hip ([place]) 05/25/2022   Hypomagnesemia 06/17/1593   Acute metabolic encephalopathy 58/59/2924   Thrombocytopenia ([place]) 04/16/2022   Anemia of chronic disease 04/16/2022   Acute kidney injury superimposed on CKD ([place]) 04/16/2022   MGUS (monoclonal gammopathy of unknown significance) 04/16/2022   Acute right hip pain 04/16/2022   Right hip pain 04/15/2022   Chronic wound 04/15/2022   Encounter for adjustment or removal of myringotomy device (stent) (tube)    Choledocholithiasis    Common bile duct dilatation    Abnormal findings on imaging of biliary tract    History of revision of total replacement of right hip joint 01/20/2016   Abnormal LFTs (liver function tests) 04/17/2015   Anemia of diabetes 04/17/2015    Atherosclerosis of coronary artery 04/17/2015   Diabetic kidney ([place]) 04/17/2015   Essential (primary) hypertension 04/17/2015   Glaucoma 04/17/2015   HLD (hyperlipidemia) 04/17/2015   Disorder of kidney 04/17/2015   Arthritis, degenerative 04/17/2015   Arthritis, hip 04/26/2013   CAD (coronary artery disease) 04/26/2013   Ischemic mitral regurgitation 04/26/2013   Type 2 diabetes mellitus with renal manifestations (HCC)    Hyperlipidemia     Orientation RESPIRATION BLADDER Height & Weight     Self, Time  Normal Incontinent Weight: 125 lb 10.6 oz (57 kg) Height:  '5\' 4"'$  (162.6 cm)  BEHAVIORAL SYMPTOMS/MOOD NEUROLOGICAL BOWEL NUTRITION STATUS      Incontinent Diet  AMBULATORY STATUS COMMUNICATION OF NEEDS Skin   Extensive Assist Verbally PU Stage and Appropriate Care                       Personal Care Assistance Level of Assistance  Bathing, Feeding, Dressing Bathing Assistance: Maximum assistance Feeding assistance: Maximum assistance Dressing Assistance: Maximum assistance     Functional Limitations Info             SPECIAL CARE FACTORS FREQUENCY  PT (By licensed PT), OT (By licensed OT), Speech therapy                    Contractures      Additional Factors Info                  Current Medications (06/21/2022):  This is the current hospital active medication list  Current Facility-Administered Medications  Medication Dose Route Frequency Provider Last Rate Last Admin   acetaminophen (TYLENOL) tablet 325-650 mg  325-650 mg Oral Q4H PRN Love, Pamela S, PA-C       acetaminophen (TYLENOL) tablet 650 mg  650 mg Oral TID Love, Pamela S, PA-C   650 mg at 06/21/22 1218   albuterol (PROVENTIL) (2.5 MG/3ML) 0.083% nebulizer solution 3 mL  3 mL Inhalation Q4H PRN Bary Leriche, PA-C   3 mL at 06/17/22 0618   alum & mag hydroxide-simeth (MAALOX/MYLANTA) 200-200-20 MG/5ML suspension 30 mL  30 mL Oral Q4H PRN Love, Pamela S, PA-C       aspirin EC tablet 81  mg  81 mg Oral Daily Bary Leriche, PA-C   81 mg at 06/21/22 0910   bisacodyl (DULCOLAX) suppository 10 mg  10 mg Rectal Daily PRN Bary Leriche, PA-C   10 mg at 06/11/22 0634   brinzolamide (AZOPT) 1 % ophthalmic suspension 1 drop  1 drop Both Eyes BID Bary Leriche, PA-C   1 drop at 06/21/22 9798   And   brimonidine (ALPHAGAN) 0.2 % ophthalmic solution 1 drop  1 drop Both Eyes BID Love, Pamela S, PA-C   1 drop at 06/21/22 0919   calcium carbonate (TUMS - dosed in mg elemental calcium) chewable tablet 400 mg of elemental calcium  400 mg of elemental calcium Oral Daily Raulkar, Clide Deutscher, MD   400 mg of elemental calcium at 06/21/22 0910   cyanocobalamin (VITAMIN B12) tablet 500 mcg  500 mcg Oral Daily Bary Leriche, PA-C   500 mcg at 06/21/22 9211   diclofenac Sodium (VOLTAREN) 1 % topical gel 2 g  2 g Topical QID Izora Ribas, MD   2 g at 06/21/22 1218   diphenhydrAMINE (BENADRYL) 12.5 MG/5ML elixir 12.5-25 mg  12.5-25 mg Oral Q6H PRN Love, Pamela S, PA-C       feeding supplement (NEPRO CARB STEADY) liquid 237 mL  237 mL Oral BID Reesa Chew S, PA-C   237 mL at 06/21/22 1225   ferrous sulfate tablet 325 mg  325 mg Oral Q breakfast Bary Leriche, PA-C   325 mg at 06/21/22 0910   lidocaine (PF) (XYLOCAINE) 1 % injection 5 mL  5 mL Other Once Raulkar, Clide Deutscher, MD       loratadine (CLARITIN) tablet 10 mg  10 mg Oral Daily Bary Leriche, PA-C   10 mg at 06/21/22 0910   methocarbamol (ROBAXIN) tablet 500 mg  500 mg Oral Q6H PRN Bary Leriche, PA-C   500 mg at 06/18/22 9417   multivitamin with minerals tablet 1 tablet  1 tablet Oral Daily Bary Leriche, PA-C   1 tablet at 06/21/22 4081   Oral care mouth rinse  15 mL Mouth Rinse PRN Raulkar, Clide Deutscher, MD       oxyCODONE (Oxy IR/ROXICODONE) immediate release tablet 5 mg  5 mg Oral Q4H PRN Love, Pamela S, PA-C   5 mg at 06/19/22 2148   oxyCODONE (Oxy IR/ROXICODONE) immediate release tablet 5 mg  5 mg Oral BID WC Love, Pamela S, PA-C   5 mg  at 06/21/22 1217   polyethylene glycol (MIRALAX / GLYCOLAX) packet 17 g  17 g Oral Daily Bary Leriche, PA-C   17 g at 06/21/22 0907   polyethylene glycol (MIRALAX / GLYCOLAX) packet 17 g  17 g Oral Daily PRN Bary Leriche, PA-C  prochlorperazine (COMPAZINE) tablet 5-10 mg  5-10 mg Oral Q6H PRN Love, Pamela S, PA-C       Or   prochlorperazine (COMPAZINE) injection 5-10 mg  5-10 mg Intramuscular Q6H PRN Love, Pamela S, PA-C       Or   prochlorperazine (COMPAZINE) suppository 12.5 mg  12.5 mg Rectal Q6H PRN Love, Pamela S, PA-C       timolol (TIMOPTIC) 0.5 % ophthalmic solution 1 drop  1 drop Both Eyes BID Love, Pamela S, PA-C   1 drop at 06/21/22 0916   traZODone (DESYREL) tablet 25-50 mg  25-50 mg Oral QHS PRN Bary Leriche, PA-C   50 mg at 06/19/22 2148   Vitamin D (Ergocalciferol) (DRISDOL) 1.25 MG (50000 UNIT) capsule 50,000 Units  50,000 Units Oral Q7 days Bary Leriche, PA-C   50,000 Units at 06/18/22 1236     Discharge Medications: Please see discharge summary for a list of discharge medications.  Relevant Imaging Results:  Relevant Lab Results:   Additional Information [id number]  Dyanne Iha

## 2022-06-21 NOTE — Progress Notes (Signed)
Granddaughter received discharge medications TOC, wheelchair, and sliding board.     Yehuda Mao, LPN

## 2022-06-21 NOTE — Progress Notes (Signed)
Physical Therapy Discharge Summary  Patient Details  Name: Gabriela [name] MRN: [id number] Date of Birth: 10/08/1925  Date of Discharge from PT service:June 21, 2022  Patient has met 4 of 6 long term goals due to improved activity tolerance, improved postural control, increased strength, ability to compensate for deficits, and improved awareness. Patient to discharge at a wheelchair level CGA/Min Assist using slideboard. Patient's care partner is independent to provide the necessary physical assistance at discharge. Pt's granddaughter attended family education training on 11/7 and participated with hands on practice with slideboard transfers. Emphasized to pt and granddaughter recommendation for slideboard transfers to be primary transfer method at this time due to current impairments.   Reasons goals not met: Pt did not meet car transfer goal of min A and WC mobility goal of 132f with supervision as pt is limited by pain, fear/anxiety with movement, and generalized weakness/deconditioning.   Recommendation:  Patient will benefit from ongoing skilled PT services in home health setting to continue to advance safe functional mobility, address ongoing impairments in transfers, generalized strengthening and endurance, standing tolerance/balance, and to minimize fall risk.  Equipment: Fully electric hospital bed, slideboard, 16x16 manual WC with Roho cushion  Reasons for discharge: treatment goals met and discharge from hospital  Patient/family agrees with progress made and goals achieved: Yes  PT Discharge Precautions/Restrictions Precautions Precautions: Fall Precaution Comments: HOH Restrictions Weight Bearing Restrictions: No RLE Weight Bearing: Weight bearing as tolerated LLE Weight Bearing: Weight bearing as tolerated Pain Interference Pain Interference Pain Effect on Sleep: 1. Rarely or not at all Pain Interference with Therapy Activities: 3. Frequently Pain Interference with  Day-to-Day Activities: 1. Rarely or not at all Cognition Overall Cognitive Status: Within Functional Limits for tasks assessed Arousal/Alertness: Awake/alert Memory: Impaired Awareness: Appears intact Problem Solving: Impaired Safety/Judgment: Appears intact Comments: fearful with movement Sensation Sensation Light Touch: Appears Intact Proprioception: Appears Intact Coordination Gross Motor Movements are Fluid and Coordinated: No Fine Motor Movements are Fluid and Coordinated: No Coordination and Movement Description: uncoordinated due to generalized weakness/deconditioning, fear of movement, and decreased standing balance/coordination Finger Nose Finger Test: slow Heel Shin Test: unable to lift either leg to perform due to pain/weakness Motor  Motor Motor: Other (comment) Motor - Skilled Clinical Observations: uncoordinated due to generalized weakness/deconditioning, fear of movement, and decreased standing balance/coordination  Mobility Bed Mobility Bed Mobility: Rolling Right;Rolling Left;Sit to Supine;Supine to Sit Rolling Right: Supervision/verbal cueing Rolling Left: Supervision/Verbal cueing Right Sidelying to Sit: Minimal Assistance - Patient > 75% Supine to Sit: Minimal Assistance - Patient > 75% Sitting - Scoot to Edge of Bed: Minimal Assistance - Patient > 75% Sit to Supine: Minimal Assistance - Patient > 75% Scooting to HOB: Minimal Assistance - Patient > 75% Transfers Transfers: Sit to Stand;Stand to Sit;Lateral/Scoot Transfers Sit to Stand: 2 Helpers Stand to Sit: 2 Helpers Lateral/Scoot Transfers: Minimal Assistance - Patient > 75% (slideboard) Transfer (Assistive device): Other (Comment) (slideboard) Locomotion  Gait Ambulation: No Gait Gait: No Stairs / Additional Locomotion Stairs: No Pick up small object from the floor assist level: Dependent - Patient 0% Wheelchair Mobility Wheelchair Mobility: Yes Wheelchair Assistance: SDentist Both upper extremities Wheelchair Parts Management: Needs assistance Distance: 1051f Trunk/Postural Assessment  Cervical Assessment Cervical Assessment: Exceptions to W[facility] Surgery Center LLCforward head) Thoracic Assessment Thoracic Assessment: Exceptions to WFD[facility] Surgery [facility] Riverbendrounded shoulders) Lumbar Assessment Lumbar Assessment: Exceptions to [facility] Hospitalposterior pelvic tilt) Postural Control Postural Control: Deficits on evaluation  Balance Balance Balance Assessed: Yes Static Sitting Balance Static  Sitting - Balance Support: Feet supported;No upper extremity supported Static Sitting - Level of Assistance: 5: Stand by assistance (supervision) Dynamic Sitting Balance Dynamic Sitting - Balance Support: Feet supported;No upper extremity supported Dynamic Sitting - Level of Assistance: 5: Stand by assistance (supervision) Static Standing Balance Static Standing - Balance Support: During functional activity;Bilateral upper extremity supported Static Standing - Level of Assistance: 2: Max assist (with RW) Extremity Assessment  RLE Assessment RLE Assessment: Not tested (due to pain and SOB) General Strength Comments: grossly 3-/5 LLE Assessment LLE Assessment: Not tested (due to pain and SOB) General Strength Comments: grossly 3-/5  Alfonse Alpers PT, DPT  06/21/2022, 7:11 AM

## 2022-06-21 NOTE — Progress Notes (Signed)
Inpatient Rehabilitation Discharge Medication Review by a Pharmacist  A complete drug regimen review was completed for this patient to identify any potential clinically significant medication issues.  High Risk Drug Classes Is patient taking? Indication by Medication  Antipsychotic No   Anticoagulant No   Antibiotic No   Opioid Yes Oxycodone prn pain  Antiplatelet No   Hypoglycemics/insulin No   Vasoactive Medication No   Chemotherapy No   Other Yes Methocarbamol - prn spasms Vitamin D - supplement Albuterol inhaler prn SOB  Timolol, Sibrinza eye drops - glaucoma      Type of Medication Issue Identified Description of Issue Recommendation(s)  Drug Interaction(s) (clinically significant)     Duplicate Therapy     Allergy     No Medication Administration End Date     Incorrect Dose     Additional Drug Therapy Needed     Significant med changes from prior encounter (inform family/care partners about these prior to discharge).    Other       Clinically significant medication issues were identified that warrant physician communication and completion of prescribed/recommended actions by midnight of the next day:  No    Time spent performing this drug regimen review (minutes):  20 minutes  Thank you Anette Guarneri, PharmD

## 2022-06-21 NOTE — Telephone Encounter (Signed)
Please Review

## 2022-06-22 ENCOUNTER — Telehealth: Payer: Self-pay | Admitting: Family Medicine

## 2022-06-22 LAB — GLUCOSE, CAPILLARY: Glucose-Capillary: 83 mg/dL (ref 70–99)

## 2022-06-22 NOTE — Telephone Encounter (Signed)
Referral placed for nephrology stage 3B CKD   Gabriela Foster, MD  [facility]  [phone]

## 2022-06-22 NOTE — Telephone Encounter (Signed)
Patient's daughter called in about getting papers filled out for cap/da program for disabled adults. Please call back to discuss

## 2022-06-22 NOTE — Plan of Care (Signed)
  Problem: Consults Goal: RH GENERAL PATIENT EDUCATION Description: See Patient Education module for education specifics. Outcome: Completed/Met   Problem: RH BOWEL ELIMINATION Goal: RH STG MANAGE BOWEL WITH ASSISTANCE Description: STG Manage Bowel with mod I Assistance. Outcome: Completed/Met Goal: RH STG MANAGE BOWEL W/MEDICATION W/ASSISTANCE Description: STG Manage Bowel with Medication with  mod I Assistance. Outcome: Completed/Met   Problem: RH BLADDER ELIMINATION Goal: RH STG MANAGE BLADDER WITH ASSISTANCE Description: STG Manage Bladder With toileting Assistance Outcome: Completed/Met   Problem: RH SAFETY Goal: RH STG ADHERE TO SAFETY PRECAUTIONS W/ASSISTANCE/DEVICE Description: STG Adhere to Safety Precautions With cues  Assistance/Device. Outcome: Completed/Met   Problem: RH PAIN MANAGEMENT Goal: RH STG PAIN MANAGED AT OR BELOW PT'S PAIN GOAL Description: < 4 with prns Outcome: Completed/Met   Problem: RH KNOWLEDGE DEFICIT GENERAL Goal: RH STG INCREASE KNOWLEDGE OF SELF CARE AFTER HOSPITALIZATION Description: Patient and family will be able to manage care at discharge using handouts and educational resources independently Outcome: Completed/Met

## 2022-06-22 NOTE — Progress Notes (Signed)
Patient ID: Gabriela [name], female   DOB: 1926-03-21, 88 y.o.   MRN: [id number]  Patient [place] agency updated to [facility] SN/PT/OT

## 2022-06-22 NOTE — Progress Notes (Signed)
Patient discharged off of unit with all belongings via [place]. Patient has no further questions at time of discharge. No complications noted at this time.  [place]

## 2022-06-22 NOTE — Progress Notes (Signed)
Patient ID: Gabriela [name], female   DOB: [birth year], 88 y.o.   MRN: [id number]  SW infromed by patient granddaughter, [name] that all DME has arrived in the home. Patient set for d/c. PTAR arranged. Packet at nursing station.

## 2022-06-22 NOTE — Telephone Encounter (Signed)
Pt's daughter called back, Gabriela [name] states that pt is bed bound and unable to come in for OV but would be willing to do VV. Scheduled VV tomorrow at 1600 with Dr. Quentin Cornwall. She asked about dropping form off before appt. I advised she can do that just to let them know she has appt scheduled. No further assistance needed.

## 2022-06-22 NOTE — Telephone Encounter (Signed)
Returned call. No answer and no vm. Patient needs a face-to-face ov with provider, in order to complete disability forms. Okay for pec triage to advise patient's daughter. Thanks.

## 2022-06-22 NOTE — Progress Notes (Signed)
Recreational Therapy Discharge Summary Patient Details  Name: Gabriela [name] MRN: [id number] Date of Birth: Nov 01, 1925 Today's Date: 06/22/2022  Comments on progress toward goals: CTRS/LRT rounded on pt weekly during LOS to provide emotional support/encouragement.  Pt easily engaged in conversation, often time smiling and laughing about her specific interests.  Pt did attend and participate in our fall festival events 11/2 as well.  Pt is scheduled for discharge home with family to provide the needed assistance. Reasons for discharge: discharge from hospital  Follow-up: [place] agrees with progress made and goals achieved: Yes  Gabriela [name] 06/22/2022, 8:32 AM

## 2022-06-22 NOTE — Progress Notes (Signed)
Inpatient Rehabilitation Care Coordinator Discharge Note   Patient Details  Name: Gabriela [name] MRN: [id number] Date of Birth: 12-10-1925   Discharge location: Home  Length of Stay: 20 days  Discharge activity level: MOD/MAX a  Home/community participation: Granddaughter, Oncologist  Patient response HL:KTGYBW Literacy - How often do you need to have someone help you when you read instructions, pamphlets, or other written material from your doctor or pharmacy?: Always  Patient response LS:LHTDSK Isolation - How often do you feel lonely or isolated from those around you?: Never  Services provided included: Neuropsych, SW, Pharmacy, TR, CM, RN, SLP, OT, PT, RD, MD  Financial Services:  Financial Services Utilized: [place] offered to/list presented to: [facility]  Follow-up services arranged:  [place]: Advanced         Patient response to transportation need: Is the patient able to respond to transportation needs?: Yes In the past 12 months, has lack of transportation kept you from medical appointments or from getting medications?: No In the past 12 months, has lack of transportation kept you from meetings, work, or from getting things needed for daily living?: No    Comments (or additional information):  Patient/Family verbalized understanding of follow-up arrangements:  Yes  Individual responsible for coordination of the follow-up plan: [name] [phone]  Confirmed correct DME delivered: Dyanne Iha 06/22/2022    Dyanne Iha

## 2022-06-23 ENCOUNTER — Encounter: Payer: Self-pay | Admitting: Family Medicine

## 2022-06-23 ENCOUNTER — Telehealth (INDEPENDENT_AMBULATORY_CARE_PROVIDER_SITE_OTHER): Payer: Medicare Other | Admitting: Family Medicine

## 2022-06-23 VITALS — BP 154/83 | HR 96

## 2022-06-23 DIAGNOSIS — S72331S Displaced oblique fracture of shaft of right femur, sequela: Secondary | ICD-10-CM

## 2022-06-23 DIAGNOSIS — I2510 Atherosclerotic heart disease of native coronary artery without angina pectoris: Secondary | ICD-10-CM

## 2022-06-23 DIAGNOSIS — I10 Essential (primary) hypertension: Secondary | ICD-10-CM

## 2022-06-23 DIAGNOSIS — R064 Hyperventilation: Secondary | ICD-10-CM

## 2022-06-23 DIAGNOSIS — E1121 Type 2 diabetes mellitus with diabetic nephropathy: Secondary | ICD-10-CM

## 2022-06-23 DIAGNOSIS — S31000D Unspecified open wound of lower back and pelvis without penetration into retroperitoneum, subsequent encounter: Secondary | ICD-10-CM

## 2022-06-23 DIAGNOSIS — S72002S Fracture of unspecified part of neck of left femur, sequela: Secondary | ICD-10-CM

## 2022-06-23 DIAGNOSIS — L89891 Pressure ulcer of other site, stage 1: Secondary | ICD-10-CM | POA: Insufficient documentation

## 2022-06-23 DIAGNOSIS — S31000A Unspecified open wound of lower back and pelvis without penetration into retroperitoneum, initial encounter: Secondary | ICD-10-CM | POA: Insufficient documentation

## 2022-06-23 MED ORDER — METOPROLOL SUCCINATE ER 25 MG PO TB24: 12.5000 mg | ORAL_TABLET | Freq: Every day | ORAL | 1 refills | Status: DC

## 2022-06-23 NOTE — Assessment & Plan Note (Signed)
Patient has prescription for oxycodone 5 mg every 4 hours as needed for pain Patient reports 8/10 pain intermittently Discussed weaning based on patient's pain levels Recommend that they increase the duration between doses so instead of doing every 4 hours, they can start doing every 6 hours and then increase to every 8 hours or every 12 hours to make sure that patient is comfortable as this will cause changes in her vitals Patient will follow-up on 06/28/2022

## 2022-06-23 NOTE — Patient Instructions (Addendum)
Please purchase a pulse oximeter to measure pulse (normal 60-100) and her oxygen saturation (93% and higher)  -if the value falls below, 90%, I recommend that she be evaluated immediately as she is not getting adequate amounts of oxygen    2. Please have her start the metoprolol succinate 12.'5mg'$  (1/2 tablet) starting 06/24/22   3. Please measure her blood pressure 1-2 hours after her blood pressure medication with goal :  -Less than 140/90 but no less than 100/60 (as this would be too low)  4. For pain medication, weaning should start by spacing out the time she takes the medication:   - start giving 1 tablet every 6 hours and if she is not in pain after that interval for two time frames, you can space out to 1 tab every 8 hours, then 12 hours, then 1 tab per day until she does not need the oxycodone and pain is managed well with Tylenol   Next appt will be 06/28/22 at 1:20 PM via MyChart

## 2022-06-23 NOTE — Assessment & Plan Note (Signed)
Chronic, stable We will restart metoprolol 12.5 mg daily Patient will continue aspirin

## 2022-06-23 NOTE — Assessment & Plan Note (Signed)
This will be diet controlled for now  Antihyperglycemic agents have been discontinued to avoid hypoglycemia Patient has been referred to nephrology for decreased GFR and increased creatinine

## 2022-06-23 NOTE — Progress Notes (Signed)
MyChart Video Visit    Virtual Visit via Video Note   This visit type was conducted due to difficulty transferring patient from home environment to car given the condition of her injuries and advanced age.  Patient location: Patient's home address  Patient's granddaughter, [name], provided majority of hx with patient visualized and available to add information and answer during the entire call   Provider location:  [facility] [address], [place], [place] [address]  I discussed the limitations of evaluation and management by telemedicine and the availability of in person appointments. The patient and family member expressed understanding and agreed to proceed.  [name]   DOB: 07-Oct-1925   88 y.o. Female  MRN: [id number] Visit Date: 06/23/2022  Today's healthcare provider: [name], MD   Chief Complaint  Patient presents with   Hospitalization Follow-up   Subjective    HPI  Follow up Hospitalization  Patient was seen at the ED at [facility] and was transferred to [facility].Patient was admitted to [facility] on 10/10 and discharged to inpatient rehabilitation and DC on 10/19. She was treated for bilateral femur Fractures and chronic right anterior thigh wound. Telephone follow up is not available for review  Patient's family has questions about elevated blood pressure measurements and metoprolol 12.5 mg once daily prescription that has not been continued since discharge  Family states the patient's lasix was also discontinued and she has questions about the blood pressure elevation   Patient states that she sometimes has pain in her legs 8/10 in severity and gets relief with the oxycodone prescribed  Her granddaughter is concerned about the strength of the medication and would like to wean the narcotics as soon as deemed appropriate   Patient has been discharged from inpatient rehab facility at [facility] for 1 day and has  received in home care at the request of the patient  Her granddaughter has been taking care of the patient in the patient's home  Patient's granddaughter lives next door with her 45 yo daughter and works during the day  She is requesting help with the [employer] program and has dropped off partially completed forms to be completed by patient's PCP   The patient has not been able to tolerate standing or transferring without assistance  She is using equipment consisting of a hoyer lift, hospital bed She has a bedside commode but cannot use this due to having difficulty and increased pain with standing  Family states that the patient will begin in home PT and OT   Family also expressed concern about increased WOB since being home  She does not currently have a pulse ox at home but is willing to purchase one at the end of visit  ----------------------------------------------------------------------------------------- -    Medications: Outpatient Medications Prior to Visit  Medication Sig   ACCU-CHEK AVIVA PLUS test strip USE AS INSTRUCTED DX E11.9   Accu-Chek FastClix Lancets MISC To check blood sugars once a day. DX E11.9   acetaminophen (TYLENOL) 325 MG tablet Take 2 tablets (650 mg total) by mouth 3 (three) times daily. Wean as able   albuterol (VENTOLIN HFA) 108 (90 Base) MCG/ACT inhaler INHALE 1-2 PUFFS INTO THE LUNGS EVERY 4 HOURS AS NEEDED FOR WHEEZING OR SHORTNESS OF BREATH. (Patient taking differently: Inhale 1-2 puffs into the lungs every 4 (four) hours as needed for wheezing or shortness of breath.)   aspirin EC 81 MG tablet Take 1  tablet (81 mg total) by mouth daily.   Blood Glucose Monitoring Suppl (ACCU-CHEK AVIVA PLUS) w/Device KIT 1 each by Does not apply route daily. Dx E11.9   calcium carbonate (TUMS - DOSED IN MG ELEMENTAL CALCIUM) 500 MG chewable tablet Chew 2 tablets (400 mg of elemental calcium total) by mouth daily.   cyanocobalamin  1000 MCG tablet Take 0.5 tablets (500 mcg total) by mouth daily.   diclofenac Sodium (VOLTAREN) 1 % GEL Apply 2 g topically 4 (four) times daily.   ferrous sulfate 325 (65 FE) MG tablet Take 1 tablet (325 mg total) by mouth daily with breakfast.   loratadine (CLARITIN) 10 MG tablet Take 1 tablet (10 mg total) by mouth daily.   methocarbamol (ROBAXIN) 500 MG tablet Take 1 tablet (500 mg total) by mouth every 6 (six) hours as needed for muscle spasms.   Multiple Vitamin (MULTIVITAMIN WITH MINERALS) TABS tablet Take 1 tablet by mouth daily.   oxyCODONE (OXY IR/ROXICODONE) 5 MG immediate release tablet Take 1 tablet (5 mg total) by mouth every 4 (four) hours as needed for moderate pain.   polyethylene glycol powder (GLYCOLAX/MIRALAX) 17 GM/SCOOP powder Take 17 g by mouth daily.   SIMBRINZA 1-0.2 % SUSP Place 1 drop into both eyes 2 (two) times daily.   timolol (TIMOPTIC) 0.5 % ophthalmic solution Place 1 drop into both eyes 2 (two) times daily.   [START ON 06/25/2022] Vitamin D, Ergocalciferol, (DRISDOL) 1.25 MG (50000 UNIT) CAPS capsule Take 1 capsule (50,000 Units total) by mouth every 7 (seven) days.   No facility-administered medications prior to visit.    Review of Systems     Objective    BP (!) 154/83 (BP Location: Right Arm, Cuff Size: Large)   Pulse 96      Physical Exam Vitals (Patient is in NAD sitting upright fanning herself in hospital bed) reviewed.  Constitutional:      General: She is not in acute distress.    Appearance: Normal appearance. She is not ill-appearing or diaphoretic.  Eyes:     General: No scleral icterus. Pulmonary:     Comments: Mild retractions noted on video Patient is on RA without supplemental oxygen  She is able to speak in complete sentences  Neurological:     Mental Status: She is alert and oriented to person, place, and time.        Assessment & Plan     Problem List Items Addressed This Visit       Cardiovascular and Mediastinum    CAD (coronary artery disease)    Chronic, stable We will restart metoprolol 12.5 mg daily Patient will continue aspirin       Relevant Medications   metoprolol succinate (TOPROL-XL) 25 MG 24 hr tablet   Essential (primary) hypertension - Primary    Blood pressure measurements are elevated per patient's home blood pressure recordings Recommended the patient restart metoprolol 12.5 mg daily Discussed parameters for blood pressure to be greater than 100/60 but less than 140/90 Granddaughter voiced understanding Counseled family that if patient's blood pressure becomes low or she has any signs of decreased mentation, she should be evaluated immediately and they should call EMS due to difficulty with transferring the patient      Relevant Medications   metoprolol succinate (TOPROL-XL) 25 MG 24 hr tablet     Endocrine   Type 2 diabetes mellitus with renal manifestations ([place])    This will be diet controlled for now  Antihyperglycemic agents have been discontinued  to avoid hypoglycemia Patient has been referred to nephrology for decreased GFR and increased creatinine         Musculoskeletal and Integument   Femur fracture, right ([place])    Patient has prescription for oxycodone 5 mg every 4 hours as needed for pain Patient reports 8/10 pain intermittently Discussed weaning based on patient's pain levels Recommend that they increase the duration between doses so instead of doing every 4 hours, they can start doing every 6 hours and then increase to every 8 hours or every 12 hours to make sure that patient is comfortable as this will cause changes in her vitals Patient will follow-up on 06/28/2022       Femur fracture, left [facility])    Patient has prescription for oxycodone 5 mg every 4 hours as needed for pain Patient reports 8/10 pain intermittently Discussed weaning based on patient's pain levels Recommend that they increase the duration between doses so instead of doing every 4  hours, they can start doing every 6 hours and then increase to every 8 hours or every 12 hours to make sure that patient is comfortable as this will cause changes in her vitals Patient will follow-up on 06/28/2022        Other   Labored breathing    Patient's granddaughter noted increased work of breathing Strongly recommended purchasing an oximeter to be able to monitor patient's oxygen saturations Family is planning to purchase that this evening Parameters given that oxygen should remain greater than 90% Counseled patient's granddaughter that if these levels drop below 90% and patient appears to have increased work of breathing or any decrease in mentation, that she will need to be evaluated immediately and they should call EMS Family was agreeable to these terms       Discussed recommendation to allow for 1 week to complete CAP/DA paperwork for diagnosis discussed during this encounter   Return in about 5 days (around 06/28/2022) for breathing .     I discussed the assessment and treatment plan with the patient. The patient was provided an opportunity to ask questions and all were answered. The patient agreed with the plan and demonstrated an understanding of the instructions.   The patient was advised to call back or seek an in-person evaluation if the symptoms worsen or if the condition fails to improve as anticipated.  I provided 47 minutes of face-to-face time during this encounter.  I, [name], MD, have reviewed all documentation for this visit. The documentation on 06/23/22 for the exam, diagnosis, procedures, and orders are all accurate and complete.  Portions of this information were initially documented by the CMA and reviewed by me for thoroughness and accuracy.     [name], MD [facility] [phone] (phone) [phone] (fax)  [place]

## 2022-06-23 NOTE — Telephone Encounter (Signed)
Acknowledged.  We will see patient this afternoon via virtual visit to discuss disability paperwork face-to-face   Gabriela Foster, MD [facility]

## 2022-06-23 NOTE — Assessment & Plan Note (Signed)
Blood pressure measurements are elevated per patient's home blood pressure recordings Recommended the patient restart metoprolol 12.5 mg daily Discussed parameters for blood pressure to be greater than 100/60 but less than 140/90 Granddaughter voiced understanding Counseled family that if patient's blood pressure becomes low or she has any signs of decreased mentation, she should be evaluated immediately and they should call EMS due to difficulty with transferring the patient

## 2022-06-23 NOTE — Assessment & Plan Note (Signed)
Patient's granddaughter noted increased work of breathing Strongly recommended purchasing an oximeter to be able to monitor patient's oxygen saturations Family is planning to purchase that this evening Parameters given that oxygen should remain greater than 90% Counseled patient's granddaughter that if these levels drop below 90% and patient appears to have increased work of breathing or any decrease in mentation, that she will need to be evaluated immediately and they should call EMS Family was agreeable to these terms

## 2022-06-25 ENCOUNTER — Encounter: Payer: Self-pay | Admitting: Family Medicine

## 2022-06-26 ENCOUNTER — Encounter: Payer: Self-pay | Admitting: Medical Oncology

## 2022-06-26 ENCOUNTER — Emergency Department
Admission: EM | Admit: 2022-06-26 | Discharge: 2022-06-26 | Disposition: A | Discharge status: Home / Self Care | Payer: Medicare Other | Attending: Emergency Medicine | Admitting: Emergency Medicine

## 2022-06-26 ENCOUNTER — Other Ambulatory Visit: Payer: Self-pay

## 2022-06-26 ENCOUNTER — Emergency Department: Payer: Medicare Other

## 2022-06-26 DIAGNOSIS — F0390 Unspecified dementia without behavioral disturbance: Secondary | ICD-10-CM | POA: Insufficient documentation

## 2022-06-26 DIAGNOSIS — Z1152 Encounter for screening for COVID-19: Secondary | ICD-10-CM | POA: Insufficient documentation

## 2022-06-26 DIAGNOSIS — J439 Emphysema, unspecified: Secondary | ICD-10-CM

## 2022-06-26 DIAGNOSIS — J430 Unilateral pulmonary emphysema [MacLeod's syndrome]: Secondary | ICD-10-CM | POA: Diagnosis not present

## 2022-06-26 DIAGNOSIS — R0602 Shortness of breath: Secondary | ICD-10-CM | POA: Diagnosis present

## 2022-06-26 DIAGNOSIS — J40 Bronchitis, not specified as acute or chronic: Secondary | ICD-10-CM | POA: Insufficient documentation

## 2022-06-26 DIAGNOSIS — N189 Chronic kidney disease, unspecified: Secondary | ICD-10-CM | POA: Diagnosis not present

## 2022-06-26 LAB — CBC WITH DIFFERENTIAL/PLATELET
Abs Immature Granulocytes: 0.51 10*3/uL — ABNORMAL HIGH (ref 0.00–0.07)
Basophils Absolute: 0 10*3/uL (ref 0.0–0.1)
Basophils Relative: 0 %
Eosinophils Absolute: 0 10*3/uL (ref 0.0–0.5)
Eosinophils Relative: 0 %
HCT: 29.1 % — ABNORMAL LOW (ref 36.0–46.0)
Hemoglobin: 9.1 g/dL — ABNORMAL LOW (ref 12.0–15.0)
Immature Granulocytes: 6 %
Lymphocytes Relative: 12 %
Lymphs Abs: 1.1 10*3/uL (ref 0.7–4.0)
MCH: 26.5 pg (ref 26.0–34.0)
MCHC: 31.3 g/dL (ref 30.0–36.0)
MCV: 84.6 fL (ref 80.0–100.0)
Monocytes Absolute: 2 10*3/uL — ABNORMAL HIGH (ref 0.1–1.0)
Monocytes Relative: 23 %
Neutro Abs: 5.4 10*3/uL (ref 1.7–7.7)
Neutrophils Relative %: 59 %
Platelets: 44 10*3/uL — ABNORMAL LOW (ref 150–400)
RBC: 3.44 MIL/uL — ABNORMAL LOW (ref 3.87–5.11)
RDW: 21.2 % — ABNORMAL HIGH (ref 11.5–15.5)
WBC: 9 10*3/uL (ref 4.0–10.5)
nRBC: 0.6 % — ABNORMAL HIGH (ref 0.0–0.2)

## 2022-06-26 LAB — COMPREHENSIVE METABOLIC PANEL
ALT: 18 U/L (ref 0–44)
AST: 38 U/L (ref 15–41)
Albumin: 2.9 g/dL — ABNORMAL LOW (ref 3.5–5.0)
Alkaline Phosphatase: 242 U/L — ABNORMAL HIGH (ref 38–126)
Anion gap: 10 (ref 5–15)
BUN: 46 mg/dL — ABNORMAL HIGH (ref 8–23)
CO2: 19 mmol/L — ABNORMAL LOW (ref 22–32)
Calcium: 8.3 mg/dL — ABNORMAL LOW (ref 8.9–10.3)
Chloride: 115 mmol/L — ABNORMAL HIGH (ref 98–111)
Creatinine, Ser: 1.28 mg/dL — ABNORMAL HIGH (ref 0.44–1.00)
GFR, Estimated: 39 mL/min — ABNORMAL LOW (ref >60)
Glucose, Bld: 132 mg/dL — ABNORMAL HIGH (ref 70–99)
Potassium: 4.1 mmol/L (ref 3.5–5.1)
Sodium: 144 mmol/L (ref 135–145)
Total Bilirubin: 2 mg/dL — ABNORMAL HIGH (ref 0.3–1.2)
Total Protein: 8.9 g/dL — ABNORMAL HIGH (ref 6.5–8.1)

## 2022-06-26 LAB — SARS CORONAVIRUS 2 BY RT PCR: SARS Coronavirus 2 by RT PCR: NEGATIVE

## 2022-06-26 LAB — TROPONIN I (HIGH SENSITIVITY)
Troponin I (High Sensitivity): 83 ng/L — ABNORMAL HIGH (ref <18)
Troponin I (High Sensitivity): 77 ng/L — ABNORMAL HIGH (ref <18)

## 2022-06-26 LAB — LIPASE, BLOOD: Lipase: 62 U/L — ABNORMAL HIGH (ref 11–51)

## 2022-06-26 MED ORDER — AMOXICILLIN-POT CLAVULANATE 875-125 MG PO TABS
1.0000 | ORAL_TABLET | Freq: Once | ORAL | Status: AC
  Administered 2022-06-26: 1 via ORAL
  Filled 2022-06-26: qty 1

## 2022-06-26 MED ORDER — IOHEXOL 350 MG/ML SOLN
60.0000 mL | Freq: Once | INTRAVENOUS | Status: AC | PRN
  Administered 2022-06-26: 60 mL via INTRAVENOUS

## 2022-06-26 MED ORDER — ALBUTEROL SULFATE HFA 108 (90 BASE) MCG/ACT IN AERS: 2.0000 | INHALATION_SPRAY | Freq: Four times a day (QID) | RESPIRATORY_TRACT | 2 refills | Status: DC | PRN

## 2022-06-26 MED ORDER — PREDNISONE 20 MG PO TABS: 40.0000 mg | ORAL_TABLET | Freq: Every day | ORAL | 0 refills | Status: DC

## 2022-06-26 MED ORDER — AZITHROMYCIN 250 MG PO TABS: ORAL_TABLET | ORAL | 0 refills | Status: DC

## 2022-06-26 MED ORDER — METHYLPREDNISOLONE SODIUM SUCC 125 MG IJ SOLR
125.0000 mg | Freq: Once | INTRAMUSCULAR | Status: AC
  Administered 2022-06-26: 125 mg via INTRAVENOUS
  Filled 2022-06-26: qty 2

## 2022-06-26 MED ORDER — IPRATROPIUM-ALBUTEROL 0.5-2.5 (3) MG/3ML IN SOLN
3.0000 mL | Freq: Once | RESPIRATORY_TRACT | Status: AC
  Administered 2022-06-26: 3 mL via RESPIRATORY_TRACT
  Filled 2022-06-26: qty 3

## 2022-06-26 MED ORDER — AMOXICILLIN-POT CLAVULANATE 875-125 MG PO TABS: 1.0000 | ORAL_TABLET | Freq: Two times a day (BID) | ORAL | 0 refills | Status: DC

## 2022-06-26 NOTE — ED Triage Notes (Signed)
Pt presents via EMS from home. EMS reports daughter states SOB x3 days. Report bedbound at baseline. EMS also report hx of dementia.

## 2022-06-26 NOTE — ED Provider Notes (Signed)
[facility] Provider Note    Event Date/Time   First MD Initiated Contact with Patient 06/26/22 [phone]     (approximate)   History   Shortness of Breath   HPI  Gabriela [name] is a 88 y.o. female with history of recent leg fractures requiring surgery who is now bedbound who comes in with concerns for shortness of breath.  Patient reports some shortness of breath.  She is oriented x2 and does have dementia so unclear exactly when the shortness of breath started.  Family was the one who stated that they noticed it.  She does have a history of CKD and falls as well.  Denies any abdominal pain or recent falls or hitting her head.  Physical Exam   Triage Vital Signs: ED Triage Vitals [06/26/22 0956]  Enc Vitals Group     BP (!) 159/81     Pulse Rate 94     Resp (!) 28     Temp 97.7 F (36.5 C)     Temp Source Oral     SpO2 100 %     Weight      Height      Head Circumference      Peak Flow      Pain Score      Pain Loc      Pain Edu?      Excl. in [place]?     Most recent vital signs: Vitals:   06/26/22 0956  BP: (!) 159/81  Pulse: 94  Resp: (!) 28  Temp: 97.7 F (36.5 C)  SpO2: 100%     General: Awake, no distress.  CV:  Good peripheral perfusion.  Resp:  Normal effort.  Clear lungs no wheezing Abd:  No distention.  Soft nontender Other:  Patient reports inability to lift up her legs secondary to prior surgeries  ED Results / Procedures / Treatments   Labs (all labs ordered are listed, but only abnormal results are displayed) Labs Reviewed  CBC WITH DIFFERENTIAL/PLATELET - Abnormal; Notable for the following components:      Result Value   RBC 3.44 (*)    Hemoglobin 9.1 (*)    HCT 29.1 (*)    RDW 21.2 (*)    Platelets 44 (*)    nRBC 0.6 (*)    Monocytes Absolute 2.0 (*)    Abs Immature Granulocytes 0.51 (*)    All other components within normal limits  COMPREHENSIVE METABOLIC PANEL - Abnormal; Notable for the following  components:   Chloride 115 (*)    CO2 19 (*)    Glucose, Bld 132 (*)    BUN 46 (*)    Creatinine, Ser 1.28 (*)    Calcium 8.3 (*)    Total Protein 8.9 (*)    Albumin 2.9 (*)    Alkaline Phosphatase 242 (*)    Total Bilirubin 2.0 (*)    GFR, Estimated 39 (*)    All other components within normal limits  LIPASE, BLOOD - Abnormal; Notable for the following components:   Lipase 62 (*)    All other components within normal limits  TROPONIN I (HIGH SENSITIVITY) - Abnormal; Notable for the following components:   Troponin I (High Sensitivity) 83 (*)    All other components within normal limits  SARS CORONAVIRUS 2 BY RT PCR  TROPONIN I (HIGH SENSITIVITY)     EKG  My interpretation of EKG:  Normal sinus rhythm 90 without any ST elevation or T wave  inversions, normal intervals  RADIOLOGY I have reviewed the xray personally and interpreted no evidence of any pneumonia   PROCEDURES:  Critical Care performed: No  .1-3 Lead EKG Interpretation  Performed by: Vanessa [place], MD Authorized by: Vanessa [place], MD     Interpretation: normal     ECG rate:  90   ECG rate assessment: normal     Rhythm: sinus rhythm     Ectopy: none      MEDICATIONS ORDERED IN ED: Medications - No data to display   IMPRESSION / MDM / [employer] / ED COURSE  I reviewed the triage vital signs and the nursing notes.   Patient's presentation is most consistent with acute presentation with potential threat to life or bodily function.   Patient comes in with concern for shortness of breath.  Her oxygen levels look normal at 100% however she does have an increased respiratory rate.  Not hear any wheezing on examination no prior history of lung problems from what I can tell.  We will get COVID testing, evaluate for ACS, pneumonia, pneumothorax.  Work-up is reassuring consider CT PE.  I considered D-dimer but she just had one tested 2 weeks ago that was elevated at 5.  Do not see that patient ever  had a CT scan done which would make me more concerned about the possibility of pulmonary embolism  Patient's granddaughter is now at bedside he does report that when she asked about the elevated D-dimer they have said that that is probably just from the surgery but he never did any CT PE to further evaluate.  She reports being concerned that there was a blood clot.  She denies any recent falls hitting her head or any abdominal discomfort.  However they would be interested in a CT scan to further evaluate for pulmonary embolism.  Troponin is slightly elevated.  CBC shows her hemoglobin being above her baseline platelets slightly low.  CMP shows creatinine downtrending from 5 days ago lipase similar to prior but downtrending and her abdomen was soft and nontender.  COVID test was negative.   CT imaging was reassuring otherwise some signs of possible bronchitis versus atypical pneumonia.  Patient oxygen levels have remained above 90%.  She does have some emphysema suspect she could have some COPD that he reports significant secondhand exposure.  We discussed admission versus going home and given patient is afebrile with normal white count.  Comfortable taking patient home and trialing antibiotics, steroids, inhaler.  They deny ever being told that she had emphysema previously.  However I did reassure them that there was no blood clots which expressed understanding felt comfortable with discharge home  Was some concern of pneumobilia on the CT scan and patient has had a prior cholecystectomy.  And had prior ERCP on 12/22/20  which would explain it.   We discussed admission versus discharge and felt comfortable with discharge.  They would prefer requesting a dose of steroids, breathing treatment before leaving and a dose of antibiotic.  I put her on some Augmentin and azithromycin.  They expressed understanding felt comfortable with discharge  The patient is on the cardiac monitor to evaluate for evidence of  arrhythmia and/or significant heart rate changes.      FINAL CLINICAL IMPRESSION(S) / ED DIAGNOSES   Final diagnoses:  Bronchitis  Pulmonary emphysema, unspecified emphysema type ([place])     Rx / DC Orders   ED Discharge Orders  Ordered    predniSONE (DELTASONE) 20 MG tablet  Daily with breakfast        06/26/22 1507    azithromycin (ZITHROMAX Z-PAK) 250 MG tablet        06/26/22 1507    amoxicillin-clavulanate (AUGMENTIN) 875-125 MG tablet  2 times daily        06/26/22 1507    albuterol (VENTOLIN HFA) 108 (90 Base) MCG/ACT inhaler  Every 6 hours PRN        06/26/22 1507             Note:  This document was prepared using Dragon voice recognition software and may include unintentional dictation errors.   Vanessa [place], MD 06/26/22 [phone]

## 2022-06-26 NOTE — Discharge Instructions (Signed)
Patient follow-up with her primary care doctor to discuss emphysema.  We have prescribed an albuterol inhaler to use and also treated her for possible bronchitis versus possible infection with 2 different antibiotics.  Return to the ER chills worsening shortness of breath or any other concerns

## 2022-06-26 NOTE — ED Notes (Signed)
One unsuccessful IV attempt to left AC.

## 2022-06-27 ENCOUNTER — Ambulatory Visit: Payer: Medicare Other | Admitting: Family Medicine

## 2022-06-28 ENCOUNTER — Telehealth (INDEPENDENT_AMBULATORY_CARE_PROVIDER_SITE_OTHER): Payer: Medicare Other | Admitting: Family Medicine

## 2022-06-28 ENCOUNTER — Encounter: Payer: Self-pay | Admitting: Family Medicine

## 2022-06-28 VITALS — BP 120/60 | HR 68

## 2022-06-28 DIAGNOSIS — J9811 Atelectasis: Secondary | ICD-10-CM | POA: Diagnosis not present

## 2022-06-28 DIAGNOSIS — J189 Pneumonia, unspecified organism: Secondary | ICD-10-CM | POA: Insufficient documentation

## 2022-06-28 DIAGNOSIS — J209 Acute bronchitis, unspecified: Secondary | ICD-10-CM | POA: Diagnosis not present

## 2022-06-28 DIAGNOSIS — I10 Essential (primary) hypertension: Secondary | ICD-10-CM

## 2022-06-28 DIAGNOSIS — E1121 Type 2 diabetes mellitus with diabetic nephropathy: Secondary | ICD-10-CM

## 2022-06-28 NOTE — Assessment & Plan Note (Signed)
BP stable, within goal range  Recommended continuing metoprolol 12.'5mg'$  daily  She will continue to check home BP measurements and follow up as scheduled for virtual visit 08/10/22

## 2022-06-28 NOTE — Assessment & Plan Note (Signed)
Patient to complete prednisone '20mg'$  course for 5 days

## 2022-06-28 NOTE — Assessment & Plan Note (Signed)
Recent ED visit diagnosis of atelectasis vs PNA vs bronchitis  Patient treated with abx (azithromycin and augmentin) as well as PRN albuterol and '20mg'$  prednisone course  Given diarrhea increased, recommended completing course of azithromycin only and discontinuing augmentin, granddaughter, [name] voiced understanding  She will complete 5 day course of prednisone '20mg'$  twice daily  Continued recommendation to monitor respiratory status and pulse oximetry  And counseled that patient should seek evaluation if she requires albuterol multiple times in an hour, family voiced understanding

## 2022-06-28 NOTE — Assessment & Plan Note (Signed)
Recommended follow up with [facility] if patient's family does not hear from referral scheduling by 11/27 - 11/28 Family was in agreement with this referral

## 2022-06-28 NOTE — Progress Notes (Signed)
I,Gabriela [name],acting as a scribe for [employer], MD.,have documented all relevant documentation on the behalf of Gabriela Foster, MD,as directed by  Gabriela Foster, MD while in the presence of Gabriela Foster, MD.   MyChart Video Visit    Virtual Visit via Video Note   This visit type was conducted due to national recommendations for restrictions regarding the COVID-19 Pandemic (e.g. social distancing) in an effort to limit this patient's exposure and mitigate transmission in our community. This patient is at least at moderate risk for complications without adequate follow up. This format is felt to be most appropriate for this patient at this time. Physical exam was limited by quality of the video and audio technology used for the visit.   Patient location: patient's home address  Provider location:  Gabriela [name] [address] [place]  [place], [place] [address]   I discussed the limitations of evaluation and management by telemedicine and the availability of in person appointments. The patient expressed understanding and agreed to proceed.  [name]   DOB: 06-22-1926   88 y.o. Female  MRN: [id number] Visit Date: 06/28/2022  Today's healthcare provider: [name], MD   Chief Complaint  Patient presents with   Follow-up   Subjective    HPI  Hypertension: Patient following on BP. Last office visit changes include restart metoprolol 12.5 mg daily. Per Gabriela [name] patient's BP readings have been good since starting BP medicine.   Follow up ER visit  Patient was seen in ER for Bronchitis and Pulmonary emphysema on 06/26/2022. Treatment for this included  CT PE and  Prednisone,Augmentin and azithromycin  She reports excellent compliance with treatment. She reports this condition is Improved.  -----------------------------------------------------------------------------------------   Medications: Outpatient Medications Prior to Visit  Medication Sig   acetaminophen (TYLENOL) 325 MG tablet Take 2 tablets (650 mg total) by mouth 3 (three) times daily. Wean as able   albuterol (VENTOLIN HFA) 108 (90 Base) MCG/ACT inhaler Inhale 2 puffs into the lungs every 6 (six) hours as needed for wheezing or shortness of breath.   amoxicillin-clavulanate (AUGMENTIN) 875-125 MG tablet Take 1 tablet by mouth 2 (two) times daily for 7 days.   aspirin EC 81 MG tablet Take 1 tablet (81 mg total) by mouth daily.   azithromycin (ZITHROMAX Z-PAK) 250 MG tablet Take 2 tablets (500 mg) on  Day 1,  followed by 1 tablet (250 mg) once daily on Days 2 through 5.   calcium carbonate (TUMS - DOSED IN MG ELEMENTAL CALCIUM) 500 MG chewable tablet Chew 2 tablets (400 mg of elemental calcium total) by mouth daily.   cyanocobalamin 1000 MCG tablet Take 0.5 tablets (500 mcg total) by mouth daily.   diclofenac Sodium (VOLTAREN) 1 % GEL Apply 2 g topically 4 (four) times daily.   ferrous sulfate 325 (65 FE) MG tablet Take 1 tablet (325 mg total) by mouth daily with breakfast.   furosemide (LASIX) 20 MG tablet Take 20 mg by mouth daily.   loratadine (CLARITIN) 10 MG tablet Take 1 tablet (10 mg total) by mouth daily.   methocarbamol (ROBAXIN) 500 MG tablet Take 1 tablet (500 mg total) by mouth every 6 (six) hours as needed for muscle spasms.   metoprolol succinate (TOPROL-XL) 25 MG 24 hr tablet Take 0.5 tablets (12.5 mg total) by mouth daily.   Multiple Vitamin (MULTIVITAMIN WITH MINERALS) TABS tablet Take 1 tablet by mouth daily.   oxyCODONE (OXY IR/ROXICODONE) 5 MG immediate release tablet Take  1 tablet (5 mg total) by mouth every 4 (four) hours as needed for moderate pain.   polyethylene glycol powder (GLYCOLAX/MIRALAX) 17 GM/SCOOP powder Take 17 g by mouth daily.   predniSONE (DELTASONE) 20 MG tablet Take 2 tablets (40 mg total) by mouth daily with breakfast for 5 days.   SIMBRINZA 1-0.2 % SUSP Place 1 drop  into both eyes 2 (two) times daily.   timolol (TIMOPTIC) 0.5 % ophthalmic solution Place 1 drop into both eyes 2 (two) times daily.   Vitamin D, Ergocalciferol, (DRISDOL) 1.25 MG (50000 UNIT) CAPS capsule Take 1 capsule (50,000 Units total) by mouth every 7 (seven) days.   No facility-administered medications prior to visit.    Review of Systems     Objective    BP 120/60 (BP Location: Left Arm, Patient Position: Sitting, Cuff Size: Normal) Comment: Per PT who is with patient at this time  Pulse 68   SpO2 100%      Physical Exam Vitals and nursing note reviewed.  Constitutional:      General: She is not in acute distress.    Appearance: Normal appearance. She is not ill-appearing or toxic-appearing.     Comments: Elderly female, pleasantly conversational in NAD, dressed in pink night gown, seated upright in living room  Pulmonary:     Effort: Pulmonary effort is normal. No respiratory distress.     Comments: Patient is breathing comfortably on RA  No signs of increased respiratory rate  No signs of retractions via video call  She is speaking in complete sentences without gasping for air in between topics  Neurological:     Mental Status: She is alert and oriented to person, place, and time. Mental status is at baseline.        Assessment & Plan     Problem List Items Addressed This Visit       Cardiovascular and Mediastinum   Essential (primary) hypertension    BP stable, within goal range  Recommended continuing metoprolol 12.'5mg'$  daily  She will continue to check home BP measurements and follow up as scheduled for virtual visit 08/10/22        Respiratory   Pneumonia of both lower lobes due to infectious organism - Primary    Recent ED visit diagnosis of atelectasis vs PNA vs bronchitis  Patient treated with abx (azithromycin and augmentin) as well as PRN albuterol and '20mg'$  prednisone course  Given diarrhea increased, recommended completing course of  azithromycin only and discontinuing augmentin, granddaughter, Gabriela [name] voiced understanding  She will complete 5 day course of prednisone '20mg'$  twice daily  Continued recommendation to monitor respiratory status and pulse oximetry  And counseled that patient should seek evaluation if she requires albuterol multiple times in an hour, family voiced understanding       Atelectasis   Acute bronchitis    Patient to complete prednisone '20mg'$  course for 5 days        Endocrine   Type 2 diabetes mellitus with renal manifestations ([place])    Recommended follow up with [facility] if patient's family does not hear from referral scheduling by 11/27 - 11/28 Family was in agreement with this referral         Return in about 6 weeks (around 08/10/2022) for HTN, emphysema .     I discussed the assessment and treatment plan with the patient. The patient was provided an opportunity to ask questions and all were answered. The patient agreed with the plan and demonstrated an  understanding of the instructions.   The patient was advised to call back or seek an in-person evaluation if the symptoms worsen or if the condition fails to improve as anticipated.  I provided 21 minutes of non-face-to-face time during this encounter.  I, Gabriela Foster, MD, have reviewed all documentation for this visit. The documentation on 06/28/22 for the exam, diagnosis, procedures, and orders are all accurate and complete.  Portions of this information were initially documented by the CMA and reviewed by me for thoroughness and accuracy.     Gabriela Foster, MD [facility] [phone] (phone) [phone] (fax)  [place]

## 2022-06-30 ENCOUNTER — Encounter: Payer: Self-pay | Admitting: Infectious Diseases

## 2022-06-30 ENCOUNTER — Telehealth: Payer: Self-pay

## 2022-06-30 NOTE — Telephone Encounter (Signed)
     Patient  visit on 11/12  at [place]   Have you been able to follow up with your primary care physician? Yes   The patient was or was not able to obtain any needed medicine or equipment. Yes   Are there diet recommendations that you are having difficulty following? Na   Patient expresses understanding of discharge instructions and education provided has no other needs at this time.  Yes     [place], Care Management  [phone] [address], [place], [place] [address] Phone: [phone] Email: [email or web address]

## 2022-06-30 NOTE — Telephone Encounter (Signed)
Hospital discharge notes reviewed per protocol. Tammy RN (PH# [phone]) with [facility]  given a -verbal approval of in home therapies.  To include nursing visits one a week for 4 weeks, home health aide for baths once a week for 3 weeks. Also to add a  physical therapy & occupational therapy evaluation.

## 2022-07-01 ENCOUNTER — Inpatient Hospital Stay
Admission: EM | Admit: 2022-07-01 | Discharge: 2022-07-04 | DRG: 605 | Disposition: A | Discharge status: Home Health | Payer: Medicare Other | Attending: Internal Medicine | Admitting: Internal Medicine

## 2022-07-01 ENCOUNTER — Other Ambulatory Visit: Payer: Self-pay

## 2022-07-01 ENCOUNTER — Emergency Department: Payer: Medicare Other

## 2022-07-01 DIAGNOSIS — D472 Monoclonal gammopathy: Secondary | ICD-10-CM | POA: Diagnosis present

## 2022-07-01 DIAGNOSIS — T148XXA Other injury of unspecified body region, initial encounter: Secondary | ICD-10-CM | POA: Diagnosis not present

## 2022-07-01 DIAGNOSIS — E1122 Type 2 diabetes mellitus with diabetic chronic kidney disease: Secondary | ICD-10-CM | POA: Diagnosis present

## 2022-07-01 DIAGNOSIS — Z79899 Other long term (current) drug therapy: Secondary | ICD-10-CM

## 2022-07-01 DIAGNOSIS — Z7982 Long term (current) use of aspirin: Secondary | ICD-10-CM

## 2022-07-01 DIAGNOSIS — X58XXXD Exposure to other specified factors, subsequent encounter: Secondary | ICD-10-CM | POA: Diagnosis present

## 2022-07-01 DIAGNOSIS — D61818 Other pancytopenia: Secondary | ICD-10-CM | POA: Diagnosis present

## 2022-07-01 DIAGNOSIS — Z833 Family history of diabetes mellitus: Secondary | ICD-10-CM | POA: Diagnosis not present

## 2022-07-01 DIAGNOSIS — E1129 Type 2 diabetes mellitus with other diabetic kidney complication: Secondary | ICD-10-CM | POA: Diagnosis present

## 2022-07-01 DIAGNOSIS — E872 Acidosis, unspecified: Secondary | ICD-10-CM | POA: Diagnosis not present

## 2022-07-01 DIAGNOSIS — I2489 Other forms of acute ischemic heart disease: Secondary | ICD-10-CM | POA: Diagnosis present

## 2022-07-01 DIAGNOSIS — D696 Thrombocytopenia, unspecified: Secondary | ICD-10-CM | POA: Diagnosis present

## 2022-07-01 DIAGNOSIS — E1121 Type 2 diabetes mellitus with diabetic nephropathy: Secondary | ICD-10-CM | POA: Diagnosis not present

## 2022-07-01 DIAGNOSIS — Z882 Allergy status to sulfonamides status: Secondary | ICD-10-CM

## 2022-07-01 DIAGNOSIS — R739 Hyperglycemia, unspecified: Secondary | ICD-10-CM

## 2022-07-01 DIAGNOSIS — E1165 Type 2 diabetes mellitus with hyperglycemia: Secondary | ICD-10-CM | POA: Diagnosis present

## 2022-07-01 DIAGNOSIS — I10 Essential (primary) hypertension: Secondary | ICD-10-CM | POA: Diagnosis not present

## 2022-07-01 DIAGNOSIS — L089 Local infection of the skin and subcutaneous tissue, unspecified: Secondary | ICD-10-CM | POA: Diagnosis not present

## 2022-07-01 DIAGNOSIS — H409 Unspecified glaucoma: Secondary | ICD-10-CM | POA: Diagnosis present

## 2022-07-01 DIAGNOSIS — M1990 Unspecified osteoarthritis, unspecified site: Secondary | ICD-10-CM | POA: Diagnosis present

## 2022-07-01 DIAGNOSIS — R54 Age-related physical debility: Secondary | ICD-10-CM | POA: Diagnosis present

## 2022-07-01 DIAGNOSIS — Z66 Do not resuscitate: Secondary | ICD-10-CM | POA: Diagnosis present

## 2022-07-01 DIAGNOSIS — S72351D Displaced comminuted fracture of shaft of right femur, subsequent encounter for closed fracture with routine healing: Secondary | ICD-10-CM | POA: Diagnosis not present

## 2022-07-01 DIAGNOSIS — E876 Hypokalemia: Secondary | ICD-10-CM | POA: Diagnosis not present

## 2022-07-01 DIAGNOSIS — Z7401 Bed confinement status: Secondary | ICD-10-CM | POA: Diagnosis not present

## 2022-07-01 DIAGNOSIS — I129 Hypertensive chronic kidney disease with stage 1 through stage 4 chronic kidney disease, or unspecified chronic kidney disease: Secondary | ICD-10-CM | POA: Diagnosis present

## 2022-07-01 DIAGNOSIS — I2510 Atherosclerotic heart disease of native coronary artery without angina pectoris: Secondary | ICD-10-CM | POA: Diagnosis present

## 2022-07-01 DIAGNOSIS — R64 Cachexia: Secondary | ICD-10-CM | POA: Diagnosis present

## 2022-07-01 DIAGNOSIS — Z8249 Family history of ischemic heart disease and other diseases of the circulatory system: Secondary | ICD-10-CM

## 2022-07-01 DIAGNOSIS — Z6821 Body mass index (BMI) 21.0-21.9, adult: Secondary | ICD-10-CM

## 2022-07-01 DIAGNOSIS — S71101A Unspecified open wound, right thigh, initial encounter: Principal | ICD-10-CM

## 2022-07-01 DIAGNOSIS — N184 Chronic kidney disease, stage 4 (severe): Secondary | ICD-10-CM | POA: Diagnosis present

## 2022-07-01 DIAGNOSIS — Z951 Presence of aortocoronary bypass graft: Secondary | ICD-10-CM

## 2022-07-01 DIAGNOSIS — E785 Hyperlipidemia, unspecified: Secondary | ICD-10-CM | POA: Diagnosis present

## 2022-07-01 DIAGNOSIS — L0889 Other specified local infections of the skin and subcutaneous tissue: Secondary | ICD-10-CM | POA: Diagnosis present

## 2022-07-01 DIAGNOSIS — Z1612 Extended spectrum beta lactamase (ESBL) resistance: Secondary | ICD-10-CM | POA: Diagnosis present

## 2022-07-01 LAB — CBC WITH DIFFERENTIAL/PLATELET
Abs Immature Granulocytes: 0.31 10*3/uL — ABNORMAL HIGH (ref 0.00–0.07)
Basophils Absolute: 0 10*3/uL (ref 0.0–0.1)
Basophils Relative: 0 %
Eosinophils Absolute: 0 10*3/uL (ref 0.0–0.5)
Eosinophils Relative: 0 %
HCT: 28.1 % — ABNORMAL LOW (ref 36.0–46.0)
Hemoglobin: 9 g/dL — ABNORMAL LOW (ref 12.0–15.0)
Immature Granulocytes: 3 %
Lymphocytes Relative: 12 %
Lymphs Abs: 1.2 10*3/uL (ref 0.7–4.0)
MCH: 26.5 pg (ref 26.0–34.0)
MCHC: 32 g/dL (ref 30.0–36.0)
MCV: 82.9 fL (ref 80.0–100.0)
Monocytes Absolute: 0.8 10*3/uL (ref 0.1–1.0)
Monocytes Relative: 8 %
Neutro Abs: 8.1 10*3/uL — ABNORMAL HIGH (ref 1.7–7.7)
Neutrophils Relative %: 77 %
Platelets: 38 10*3/uL — ABNORMAL LOW (ref 150–400)
RBC: 3.39 MIL/uL — ABNORMAL LOW (ref 3.87–5.11)
RDW: 21.1 % — ABNORMAL HIGH (ref 11.5–15.5)
WBC: 10.4 10*3/uL (ref 4.0–10.5)
nRBC: 0.2 % (ref 0.0–0.2)

## 2022-07-01 LAB — COMPREHENSIVE METABOLIC PANEL
ALT: 18 U/L (ref 0–44)
AST: 29 U/L (ref 15–41)
Albumin: 3.3 g/dL — ABNORMAL LOW (ref 3.5–5.0)
Alkaline Phosphatase: 184 U/L — ABNORMAL HIGH (ref 38–126)
Anion gap: 12 (ref 5–15)
BUN: 66 mg/dL — ABNORMAL HIGH (ref 8–23)
CO2: 22 mmol/L (ref 22–32)
Calcium: 8.6 mg/dL — ABNORMAL LOW (ref 8.9–10.3)
Chloride: 106 mmol/L (ref 98–111)
Creatinine, Ser: 1.48 mg/dL — ABNORMAL HIGH (ref 0.44–1.00)
GFR, Estimated: 33 mL/min — ABNORMAL LOW (ref >60)
Glucose, Bld: 241 mg/dL — ABNORMAL HIGH (ref 70–99)
Potassium: 4.7 mmol/L (ref 3.5–5.1)
Sodium: 140 mmol/L (ref 135–145)
Total Bilirubin: 1.6 mg/dL — ABNORMAL HIGH (ref 0.3–1.2)
Total Protein: 8.9 g/dL — ABNORMAL HIGH (ref 6.5–8.1)

## 2022-07-01 LAB — LACTIC ACID, PLASMA
Lactic Acid, Venous: 2.7 mmol/L (ref 0.5–1.9)
Lactic Acid, Venous: 1.8 mmol/L (ref 0.5–1.9)

## 2022-07-01 LAB — HEMOGLOBIN A1C
Hgb A1c MFr Bld: 6.8 % — ABNORMAL HIGH (ref 4.8–5.6)
Mean Plasma Glucose: 148.46 mg/dL

## 2022-07-01 LAB — IRON AND TIBC
Iron: 118 ug/dL (ref 28–170)
Saturation Ratios: 56 % — ABNORMAL HIGH (ref 10.4–31.8)
TIBC: 210 ug/dL — ABNORMAL LOW (ref 250–450)
UIBC: 92 ug/dL

## 2022-07-01 LAB — FERRITIN: Ferritin: 1764 ng/mL — ABNORMAL HIGH (ref 11–307)

## 2022-07-01 LAB — GLUCOSE, CAPILLARY: Glucose-Capillary: 133 mg/dL — ABNORMAL HIGH (ref 70–99)

## 2022-07-01 MED ORDER — INSULIN ASPART 100 UNIT/ML IJ SOLN
6.0000 [IU] | Freq: Once | INTRAMUSCULAR | Status: AC
  Administered 2022-07-01: 6 [IU] via SUBCUTANEOUS
  Filled 2022-07-01: qty 1

## 2022-07-01 MED ORDER — SODIUM CHLORIDE 0.9 % IV SOLN: 1.0000 g | INTRAVENOUS | Status: DC

## 2022-07-01 MED ORDER — FUROSEMIDE 20 MG PO TABS
20.0000 mg | ORAL_TABLET | Freq: Every day | ORAL | Status: DC
  Administered 2022-07-01 – 2022-07-04 (×4): 20 mg via ORAL
  Filled 2022-07-01 (×4): qty 1

## 2022-07-01 MED ORDER — ALBUTEROL SULFATE HFA 108 (90 BASE) MCG/ACT IN AERS: 2.0000 | INHALATION_SPRAY | Freq: Four times a day (QID) | RESPIRATORY_TRACT | Status: DC | PRN

## 2022-07-01 MED ORDER — ASPIRIN 81 MG PO TBEC
81.0000 mg | DELAYED_RELEASE_TABLET | Freq: Every day | ORAL | Status: DC
  Administered 2022-07-02 – 2022-07-04 (×3): 81 mg via ORAL
  Filled 2022-07-01 (×3): qty 1

## 2022-07-01 MED ORDER — OXYCODONE HCL 5 MG PO TABS: 5.0000 mg | ORAL_TABLET | ORAL | Status: DC | PRN

## 2022-07-01 MED ORDER — VITAMIN B-12 1000 MCG PO TABS
500.0000 ug | ORAL_TABLET | Freq: Every day | ORAL | Status: DC
  Administered 2022-07-01 – 2022-07-04 (×4): 500 ug via ORAL
  Filled 2022-07-01 (×4): qty 1

## 2022-07-01 MED ORDER — VANCOMYCIN HCL 750 MG/150ML IV SOLN: 750.0000 mg | INTRAVENOUS | Status: DC

## 2022-07-01 MED ORDER — ENSURE ENLIVE PO LIQD
237.0000 mL | Freq: Two times a day (BID) | ORAL | Status: DC
  Administered 2022-07-01 – 2022-07-04 (×7): 237 mL via ORAL

## 2022-07-01 MED ORDER — SODIUM CHLORIDE 0.9 % IV BOLUS
500.0000 mL | Freq: Once | INTRAVENOUS | Status: AC
  Administered 2022-07-01: 500 mL via INTRAVENOUS

## 2022-07-01 MED ORDER — SODIUM CHLORIDE 0.9 % IV SOLN
2.0000 g | INTRAVENOUS | Status: DC
  Administered 2022-07-01 – 2022-07-02 (×2): 2 g via INTRAVENOUS
  Filled 2022-07-01 (×2): qty 20
  Filled 2022-07-01: qty 2

## 2022-07-01 MED ORDER — TIMOLOL MALEATE 0.5 % OP SOLN
1.0000 [drp] | Freq: Two times a day (BID) | OPHTHALMIC | Status: DC
  Administered 2022-07-01 – 2022-07-04 (×6): 1 [drp] via OPHTHALMIC
  Filled 2022-07-01: qty 5

## 2022-07-01 MED ORDER — METRONIDAZOLE 500 MG/100ML IV SOLN
500.0000 mg | Freq: Once | INTRAVENOUS | Status: AC
  Administered 2022-07-01: 500 mg via INTRAVENOUS
  Filled 2022-07-01: qty 100

## 2022-07-01 MED ORDER — SODIUM CHLORIDE 0.9 % IV SOLN
2.0000 g | Freq: Once | INTRAVENOUS | Status: AC
  Administered 2022-07-01: 2 g via INTRAVENOUS
  Filled 2022-07-01: qty 12.5

## 2022-07-01 MED ORDER — VANCOMYCIN HCL IN DEXTROSE 1-5 GM/200ML-% IV SOLN
1000.0000 mg | Freq: Once | INTRAVENOUS | Status: AC
  Administered 2022-07-01: 1000 mg via INTRAVENOUS
  Filled 2022-07-01: qty 200

## 2022-07-01 MED ORDER — METOPROLOL SUCCINATE ER 25 MG PO TB24
12.5000 mg | ORAL_TABLET | Freq: Every day | ORAL | Status: DC
  Administered 2022-07-02 – 2022-07-04 (×3): 12.5 mg via ORAL
  Filled 2022-07-01 (×3): qty 1

## 2022-07-01 MED ORDER — INSULIN ASPART 100 UNIT/ML IJ SOLN
0.0000 [IU] | Freq: Three times a day (TID) | INTRAMUSCULAR | Status: DC
  Administered 2022-07-02: 1 [IU] via SUBCUTANEOUS
  Filled 2022-07-01: qty 1

## 2022-07-01 MED ORDER — FERROUS SULFATE 325 (65 FE) MG PO TABS
325.0000 mg | ORAL_TABLET | Freq: Every day | ORAL | Status: DC
  Administered 2022-07-02 – 2022-07-04 (×3): 325 mg via ORAL
  Filled 2022-07-01 (×3): qty 1

## 2022-07-01 NOTE — Consult Note (Addendum)
Pharmacy Antibiotic Note  Gabriela [name] is a 88 y.o. female admitted on 07/01/2022 with cellulitis.  Pharmacy has been consulted for vancomycin dosing.  Plan: Patient received vancomycin '1000mg'$  IV x1 in ED. Give vancomycin '750mg'$  IV every 48 hours Continue to monitor and dose adjust antibiotics according to renal function and indication Goal AUC 400-550  Est AUC: 433 Est Cmax: 28.7 Est Cmin: 10.7 Calculated with SCr 1.48 mg/dL  Vd 0.72 L/kg    Height: '5\' 4"'$  (162.6 cm) Weight: 57 kg (125 lb 10.6 oz) IBW/kg (Calculated) : 54.7  Temp (24hrs), Avg:98 F (36.7 C), Min:98 F (36.7 C), Max:98 F (36.7 C)  Recent Labs  Lab 06/26/22 1018 07/01/22 1017 07/01/22 1032  WBC 9.0  --  10.4  CREATININE 1.28*  --  1.48*  LATICACIDVEN  --  2.7*  --     Estimated Creatinine Clearance: 20.1 mL/min (A) (by C-G formula based on SCr of 1.48 mg/dL (H)).    Allergies  Allergen Reactions   Sulfa Antibiotics Rash    Antimicrobials this admission: 11/17 vancomycin >>  11/17 ceftriaxone >>   Microbiology results: 11/17 BCx: sent 11/17 Thigh wound cx: sent  Thank you for allowing pharmacy to be a part of this patient's care.  Darrick Penna 07/01/2022 1:31 PM

## 2022-07-01 NOTE — Code Documentation (Signed)
CODE SEPSIS - PHARMACY COMMUNICATION  **Broad Spectrum Antibiotics should be administered within 1 hour of Sepsis diagnosis**  Time Code Sepsis Called/Page Received: 1141  Antibiotics Ordered: vancomycin, cefepime  Time of 1st antibiotic administration: [place] ,PharmD Clinical Pharmacist  07/01/2022  12:06 PM

## 2022-07-01 NOTE — ED Notes (Addendum)
No glucerna per family. Protein shake need to be low in potassium due to poor kidney function per daughter.

## 2022-07-01 NOTE — ED Notes (Addendum)
1st set of cultures sent down with basic labs plus lactic.

## 2022-07-01 NOTE — Telephone Encounter (Signed)
Patient does not have a wound care doctor. Patient's Power of Oneta Rack would like the area evaluated to see if patient needs antibiotics and a referral for wound care. Patient scheduled with Dr. Candiss Norse 07/05/22 Ronit Cranfield T Brooks Sailors

## 2022-07-01 NOTE — H&P (Signed)
History and Physical    [name] FXT:[id number] DOB: June 27, 1926 DOA: 07/01/2022 DOS: the patient was seen and examined on 07/01/2022 PCP: [name], MD  Patient coming from: Home  Chief Complaint: Generalized weakness. Chief Complaint  Patient presents with   Wound Infection   HPI: Gabriela [name] is a 88 y.o. female with medical history significant of type 2 diabetes, essential hypertension, arthritis who presents to the hospital with generalized weakness and a right thigh wound with infection. Patient had a recurrent right thigh wound with infection over the last few years.  Every time, she was treated with antibiotics, the wound closed up, then opened again with infection. She had a bilateral femur fracture 2 months ago, had a surgery.  Since that time, she has been bedbound.  She had upper respiratory infection about 2 weeks ago, she was placed on steroids, and antibiotics.  Respiratory symptom has resolved.  But for the last 3 days, patient became more sleepy, right thigh wound which she was a previous healed up, opened up again.  Initially has a large amount of bloody drain. She came to the emergency room, temperature 98, heart rate 88, respiration rate 16, blood pressure 160/70. Blood cell count 9.0, hemoglobin 9.1, platelets 44.  Creatinine 1.28, CO2 19.  Troponin 83.  X-ray of the right femur did not show any acute changes.  Wound culture is sent out, she was started on vancomycin and Rocephin.  Review of Systems: As mentioned in the history of present illness. All other systems reviewed and are negative. Past Medical History:  Diagnosis Date   Arthritis    Diabetes mellitus ([place])    Glaucoma    Hyperlipidemia    Hypertension    Shortness of breath    with exertion   Past Surgical History:  Procedure Laterality Date   CARDIAC CATHETERIZATION  04/22/13   CHOLECYSTECTOMY     CORONARY ARTERY BYPASS GRAFT N/A 05/31/2013   Procedure: Coronary  Artery Bypass Grafting Times Three Using Left Internal Mammary Artery and Right Saphenous Leg Vein Harvested Endoscopically;  Surgeon: Ivin Poot, MD;  Location: [place];  Service: Open Heart Surgery;  Laterality: N/A;   ERCP N/A 10/20/2020   Procedure: ENDOSCOPIC RETROGRADE CHOLANGIOPANCREATOGRAPHY (ERCP);  Surgeon: Lucilla Lame, MD;  [facility] ENDOSCOPY;  Service: Endoscopy;  Laterality: N/A;   ERCP N/A 12/22/2020   Procedure: ENDOSCOPIC RETROGRADE CHOLANGIOPANCREATOGRAPHY (ERCP);  Surgeon: Lucilla Lame, MD;  Location: [facility] ENDOSCOPY;  Service: Endoscopy;  Laterality: N/A;   EYE SURGERY     HIP SURGERY     right   INTRAOPERATIVE TRANSESOPHAGEAL ECHOCARDIOGRAM N/A 05/31/2013   Procedure: INTRAOPERATIVE TRANSESOPHAGEAL ECHOCARDIOGRAM;  Surgeon: Ivin Poot, MD;  Location: [place];  Service: Open Heart Surgery;  Laterality: N/A;   JOINT REPLACEMENT     MITRAL VALVE REPAIR N/A 05/31/2013   Procedure: MITRAL VALVE REPAIR (MVR);  Surgeon: Ivin Poot, MD;  Location: [place];  Service: Open Heart Surgery;  Laterality: N/A;   ORIF FEMUR FRACTURE Bilateral 05/26/2022   Procedure: OPEN REDUCTION INTERNAL FIXATION (ORIF) DISTAL FEMUR FRACTURE;  Surgeon: Altamese Bellingham, MD;  Location: [place];  Service: Orthopedics;  Laterality: Bilateral;   TONSILLECTOMY     Social History:  reports that she has never smoked. She has never used smokeless tobacco. She reports that she does not drink alcohol and does not use drugs.  Allergies  Allergen Reactions   Sulfa Antibiotics Rash    Family History  Problem Relation Age of  Onset   Diabetes Sister    Hypertension Mother     Prior to Admission medications   Medication Sig Start Date End Date Taking? Authorizing Provider  acetaminophen (TYLENOL) 325 MG tablet Take 2 tablets (650 mg total) by mouth 3 (three) times daily. Wean as able 06/21/22   Love, Ivan Anchors, PA-C  albuterol (VENTOLIN HFA) 108 (90 Base) MCG/ACT inhaler Inhale 2 puffs into the lungs  every 6 (six) hours as needed for wheezing or shortness of breath. 06/26/22   Vanessa [place], MD  amoxicillin-clavulanate (AUGMENTIN) 875-125 MG tablet Take 1 tablet by mouth 2 (two) times daily for 7 days. 06/26/22 07/03/22  Vanessa [place], MD  aspirin EC 81 MG tablet Take 1 tablet (81 mg total) by mouth daily. 06/21/22   Love, Ivan Anchors, PA-C  azithromycin (ZITHROMAX Z-PAK) 250 MG tablet Take 2 tablets (500 mg) on  Day 1,  followed by 1 tablet (250 mg) once daily on Days 2 through 5. 06/26/22 07/01/22  Vanessa [place], MD  calcium carbonate (TUMS - DOSED IN MG ELEMENTAL CALCIUM) 500 MG chewable tablet Chew 2 tablets (400 mg of elemental calcium total) by mouth daily. 06/21/22   Love, Ivan Anchors, PA-C  cyanocobalamin 1000 MCG tablet Take 0.5 tablets (500 mcg total) by mouth daily. 05/12/22 08/10/22  Simmons-Robinson, Riki Sheer, MD  diclofenac Sodium (VOLTAREN) 1 % GEL Apply 2 g topically 4 (four) times daily. 06/21/22   Love, Ivan Anchors, PA-C  ferrous sulfate 325 (65 FE) MG tablet Take 1 tablet (325 mg total) by mouth daily with breakfast. 06/21/22   Love, Ivan Anchors, PA-C  furosemide (LASIX) 20 MG tablet Take 20 mg by mouth daily. 06/22/22   [provider]  loratadine (CLARITIN) 10 MG tablet Take 1 tablet (10 mg total) by mouth daily. 06/21/22   Love, Ivan Anchors, PA-C  methocarbamol (ROBAXIN) 500 MG tablet Take 1 tablet (500 mg total) by mouth every 6 (six) hours as needed for muscle spasms. 06/21/22   Love, Ivan Anchors, PA-C  metoprolol succinate (TOPROL-XL) 25 MG 24 hr tablet Take 0.5 tablets (12.5 mg total) by mouth daily. 06/23/22   Simmons-Robinson, Riki Sheer, MD  Multiple Vitamin (MULTIVITAMIN WITH MINERALS) TABS tablet Take 1 tablet by mouth daily. 06/15/22   Love, Ivan Anchors, PA-C  oxyCODONE (OXY IR/ROXICODONE) 5 MG immediate release tablet Take 1 tablet (5 mg total) by mouth every 4 (four) hours as needed for moderate pain. 06/21/22   Love, Ivan Anchors, PA-C  polyethylene glycol powder (GLYCOLAX/MIRALAX) 17 GM/SCOOP  powder Take 17 g by mouth daily. 06/21/22   Love, Ivan Anchors, PA-C  predniSONE (DELTASONE) 20 MG tablet Take 2 tablets (40 mg total) by mouth daily with breakfast for 5 days. 06/26/22 07/01/22  Vanessa [place], MD  SIMBRINZA 1-0.2 % SUSP Place 1 drop into both eyes 2 (two) times daily. 12/07/21   [provider]  timolol (TIMOPTIC) 0.5 % ophthalmic solution Place 1 drop into both eyes 2 (two) times daily. 11/16/21   [provider]  Vitamin D, Ergocalciferol, (DRISDOL) 1.25 MG (50000 UNIT) CAPS capsule Take 1 capsule (50,000 Units total) by mouth every 7 (seven) days. 06/25/22   Bary Leriche, PA-C    Physical Exam: Vitals:   07/01/22 1030 07/01/22 1130 07/01/22 1200 07/01/22 1230  BP: (!) 161/70 (!) 165/71 (!) 173/83 (!) 172/71  Pulse: 88 80 70 70  Resp: '16 16  16  '$ Temp:      TempSrc:      SpO2:  98% 98% 100% 99%  Weight:      Height:       Physical Exam Constitutional:      General: She is not in acute distress.    Appearance: She is not toxic-appearing or diaphoretic.  HENT:     Head: Normocephalic and atraumatic.     Nose: Nose normal. No congestion.     Mouth/Throat:     Mouth: Mucous membranes are moist.     Pharynx: Oropharynx is clear. No oropharyngeal exudate.  Eyes:     Extraocular Movements: Extraocular movements intact.     Conjunctiva/sclera: Conjunctivae normal.     Pupils: Pupils are equal, round, and reactive to light.  Cardiovascular:     Rate and Rhythm: Normal rate and regular rhythm.     Heart sounds: No murmur heard.    No gallop.  Pulmonary:     Effort: Pulmonary effort is normal. No respiratory distress.     Breath sounds: No wheezing or rales.  Abdominal:     General: Abdomen is flat. Bowel sounds are normal. There is no distension.     Palpations: Abdomen is soft.     Tenderness: There is no abdominal tenderness.  Musculoskeletal:        General: No swelling or tenderness. Normal range of motion.     Cervical back: Normal range of  motion and neck supple. No rigidity.     Comments: Right thigh wound size of 2 cm, appear very deep with a bloody drain.  Lymphadenopathy:     Cervical: No cervical adenopathy.  Skin:    General: Skin is warm and dry.     Coloration: Skin is not jaundiced.  Neurological:     General: No focal deficit present.     Mental Status: She is alert. Mental status is at baseline.     Cranial Nerves: No cranial nerve deficit.  Psychiatric:        Mood and Affect: Mood normal.        Behavior: Behavior normal.     Data Reviewed:  Reviewed lab results, femur x-ray results.  Assessment and Plan: Right thigh wound with infection. Sepsis ruled out. Patient has a deep wound in the right thigh, with bloody drainage.  Patient had a large amount of drainage yesterday, indicating infection.  Surrounding skin are not red. Patient had recurrent infection in the past, all healed.  Patient was followed by ID as outpatient.  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 She is started on antibiotics with vancomycin and Rocephin, will continue that.  I will obtain general surgery consult to see if there is any need for debridement.  Wound culture sent out.  Pancytopenia with anemia and thrombocytopenia. Monoclonal complexly of unknown significance Patient is taking B12 and iron.  We will recheck a B12 and iron levels to make sure patient does not have malabsorption. Reviewed patient prior lab, condition appears to be chronic. We will continue to monitor, transfuse as needed.  Patient be followed by hematology after discharge from hospital.  Chronic kidney disease stage IV. Reviewed prior labs, patient renal function still stable.  Type 2 diabetes with a renal manifestation. Start sliding scale insulin for now.  Essential hypertension. Resume home medicines.  Elevated troponin. History of coronary disease. Elevated troponin most likely secondary to demand ischemia from infection.  Obtain EKG.   Advance Care Planning:   Code Status: DNR   Consults: General surgery  Family Communication: Granddaughter at bedside.  Severity of Illness: The appropriate patient status for this patient is INPATIENT. Inpatient status is judged to be reasonable and necessary in order to provide the required intensity of service to ensure the patient's safety. The patient's presenting symptoms, physical exam findings, and initial radiographic and laboratory data in the context of their chronic comorbidities is felt to place them at high risk for further clinical deterioration. Furthermore, it is not anticipated that the patient will be medically stable for discharge from the hospital within 2 midnights of admission.   * I certify that at the point of admission it is my clinical judgment that the patient will require inpatient hospital care spanning beyond 2 midnights from the point of admission due to high intensity of service, high risk for further deterioration and high frequency of surveillance required.*  Author: Sharen Hones, MD 07/01/2022 1:21 PM  For on call review [email or web address].

## 2022-07-01 NOTE — ED Provider Notes (Signed)
[facility] Provider Note    Event Date/Time   First MD Initiated Contact with Patient 07/01/22 1012     (approximate)   History   Wound Infection   HPI  Gabriela [name] is a 88 y.o. female who presents to the ER for evaluation of open wound to the right thigh.  According to patient's granddaughter this is been a recurring issue since 2016.  She has had orthopedic surgery on that thigh.  Has had issues with pseudomonal infections was on Cipro.  Recently seen in the ER and treated for bronchitis put on prednisone.  Blood sugars have been elevated she is not currently on any antibiotics.  Has been primarily clear drainage.  They are trying to follow-up with her infectious disease specialist but [place].  She does not follow with wound.  No interval falls no reported fevers or chills.     Physical Exam   Triage Vital Signs: ED Triage Vitals  Enc Vitals Group     BP 07/01/22 1013 (!) 155/66     Pulse Rate 07/01/22 1013 85     Resp 07/01/22 1013 20     Temp 07/01/22 1013 98 F (36.7 C)     Temp Source 07/01/22 1013 Oral     SpO2 07/01/22 1013 100 %     Weight 07/01/22 1015 125 lb 10.6 oz (57 kg)     Height 07/01/22 1015 '5\' 4"'$  (1.626 m)     Head Circumference --      Peak Flow --      Pain Score 07/01/22 1014 0     Pain Loc --      Pain Edu? --      Excl. in [place]? --     Most recent vital signs: Vitals:   07/01/22 1200 07/01/22 1230  BP: (!) 173/83 (!) 172/71  Pulse: 70 70  Resp:  16  Temp:    SpO2: 100% 99%     Constitutional: Alert, frail appering Eyes: Conjunctivae are normal.  Head: Atraumatic. Nose: No congestion/rhinnorhea. Mouth/Throat: Mucous membranes are moist.   Neck: Painless ROM.  Cardiovascular:   Good peripheral circulation. Respiratory: Normal respiratory effort.  No retractions.  Gastrointestinal: Soft and nontender.  Musculoskeletal:  no deformity, cachectic appearing.  1 cm open wound to  the right anterior thigh with clear slightly purulent drainage no abscess or fluctuance.  Thigh compartment is soft. Neurologic:  MAE spontaneously. No gross focal neurologic deficits are appreciated.  Skin:  Skin is warm, dry and intact. No rash noted. Psychiatric: Mood and affect are normal. Speech and behavior are normal.    ED Results / Procedures / Treatments   Labs (all labs ordered are listed, but only abnormal results are displayed) Labs Reviewed  CBC WITH DIFFERENTIAL/PLATELET - Abnormal; Notable for the following components:      Result Value   RBC 3.39 (*)    Hemoglobin 9.0 (*)    HCT 28.1 (*)    RDW 21.1 (*)    Platelets 38 (*)    Neutro Abs 8.1 (*)    Abs Immature Granulocytes 0.31 (*)    All other components within normal limits  COMPREHENSIVE METABOLIC PANEL - Abnormal; Notable for the following components:   Glucose, Bld 241 (*)    BUN 66 (*)    Creatinine, Ser 1.48 (*)    Calcium 8.6 (*)    Total Protein 8.9 (*)    Albumin 3.3 (*)  Alkaline Phosphatase 184 (*)    Total Bilirubin 1.6 (*)    GFR, Estimated 33 (*)    All other components within normal limits  LACTIC ACID, PLASMA - Abnormal; Notable for the following components:   Lactic Acid, Venous 2.7 (*)    All other components within normal limits  AEROBIC/ANAEROBIC CULTURE W GRAM STAIN (SURGICAL/DEEP WOUND)  CULTURE, BLOOD (ROUTINE X 2)  CULTURE, BLOOD (ROUTINE X 2)  LACTIC ACID, PLASMA     EKG   RADIOLOGY Please see ED Course for my review and interpretation.  I personally reviewed all radiographic images ordered to evaluate for the above acute complaints and reviewed radiology reports and findings.  These findings were personally discussed with the patient.  Please see medical record for radiology report.    PROCEDURES:  Critical Care performed: No  Procedures   MEDICATIONS ORDERED IN ED: Medications  metroNIDAZOLE (FLAGYL) IVPB 500 mg (500 mg Intravenous New Bag/Given 07/01/22  1218)  vancomycin (VANCOCIN) IVPB 1000 mg/200 mL premix (has no administration in time range)  insulin aspart (novoLOG) injection 6 Units (has no administration in time range)  sodium chloride 0.9 % bolus 500 mL (500 mLs Intravenous New Bag/Given 07/01/22 1151)  ceFEPIme (MAXIPIME) 2 g in sodium chloride 0.9 % 100 mL IVPB (0 g Intravenous Stopped 07/01/22 1216)     IMPRESSION / MDM / ASSESSMENT AND PLAN / ED COURSE  I reviewed the triage vital signs and the nursing notes.                              Differential diagnosis includes, but is not limited to, cellulitis, abscess, wound dehiscence, foreign body, neck Fassc,  Patient presenting to the ER for evaluation of symptoms as described above.  Based on symptoms, risk factors and considered above differential, this presenting complaint could reflect a potentially life-threatening illness therefore the patient will be placed on continuous pulse oximetry and telemetry for monitoring.  Laboratory evaluation will be sent to evaluate for the above complaints.      Clinical Course as of 07/01/22 1251  Fri Jul 01, 2022  1111 X-ray on my review and interpretation does not show any evidence of acute fracture.  Small mount of emphysema consistent with area of open wound. [PR]  1239 Her lactate is mildly elevated.  While she does not have any findings of drainable abscess given the elevated lactate I am concerned for mild infection specially given her history of pseudomonal infections given her age risk factors and comorbidities I do believe admission to the hospital for observation for IV antibiotics and wound care consultation is appropriate.  Family agreeable to plan.  Hospitalist was consulted who agreed to evaluate patient at bedside. [PR]    Clinical Course User Index [PR] Merlyn Lot, MD     FINAL CLINICAL IMPRESSION(S) / ED DIAGNOSES   Final diagnoses:  Open wound of right thigh, initial encounter  Hyperglycemia     Rx / DC  Orders   ED Discharge Orders     None        Note:  This document was prepared using Dragon voice recognition software and may include unintentional dictation errors.    Merlyn Lot, MD 07/01/22 1252

## 2022-07-01 NOTE — Consult Note (Signed)
PHARMACY -  BRIEF ANTIBIOTIC NOTE   Pharmacy has received consult(s) for cefepime, vancomycin from an ED provider.  The patient's profile has been reviewed for ht/wt/allergies/indication/available labs.    One time order(s) placed for cefepime 2g IV x1, vancomycin 1g IV x1  Further antibiotics/pharmacy consults should be ordered by admitting physician if indicated.                       Thank you, Darrick Penna 07/01/2022  12:08 PM

## 2022-07-01 NOTE — Consult Note (Signed)
Subjective:   CC: Recurrent right thigh wound  HPI:  Gabriela [name] is a 88 y.o. female who was consulted by [facility] for evaluation of above.  History obtained from granddaughter who was at bedside.  According to her, wound initially started in 2016 when she had orthopedic hardware in her hip break off and require removal.  The wound has been episodically symptomatic since that time.  Symptoms include drainage, swelling and or pain localized to that area.  She has been treated with antibiotics and also been under the care of infectious disease specialists.  The most recent episode started few days ago after being treated for upper respiratory infection with steroids.  The area had a large amount of bloody discharge a day or so prior to admission.  Currently the patient has minimal complaints to that area and no obvious drainage.  Of note, she has had intervals of where the wound has completely healed before.  Past Medical History:  has a past medical history of Arthritis, Diabetes mellitus ([place]), Glaucoma, Hyperlipidemia, Hypertension, and Shortness of breath.  Past Surgical History:  has a past surgical history that includes Hip surgery; Joint replacement; Cholecystectomy; Tonsillectomy; Cardiac catheterization (04/22/13); Coronary artery bypass graft (N/A, 05/31/2013); Mitral valve repair (N/A, 05/31/2013); Intraoprative transesophageal echocardiogram (N/A, 05/31/2013); Eye surgery; ERCP (N/A, 10/20/2020); ERCP (N/A, 12/22/2020); and ORIF femur fracture (Bilateral, 05/26/2022).  Family History: family history includes Diabetes in her sister; Hypertension in her mother.  Social History:  reports that she has never smoked. She has never used smokeless tobacco. She reports that she does not drink alcohol and does not use drugs.  Current Medications:  Prior to Admission medications   Medication Sig Start Date End Date Taking? Authorizing Provider  amoxicillin-clavulanate (AUGMENTIN) 875-125 MG tablet  Take 1 tablet by mouth 2 (two) times daily for 7 days. 06/26/22 07/03/22 Yes Vanessa [place], MD  aspirin EC 81 MG tablet Take 1 tablet (81 mg total) by mouth daily. 06/21/22  Yes Love, Ivan Anchors, PA-C  azithromycin (ZITHROMAX Z-PAK) 250 MG tablet Take 2 tablets (500 mg) on  Day 1,  followed by 1 tablet (250 mg) once daily on Days 2 through 5. 06/26/22 07/01/22 Yes Vanessa [place], MD  calcium carbonate (TUMS - DOSED IN MG ELEMENTAL CALCIUM) 500 MG chewable tablet Chew 2 tablets (400 mg of elemental calcium total) by mouth daily. 06/21/22  Yes Love, Ivan Anchors, PA-C  ferrous sulfate 325 (65 FE) MG tablet Take 1 tablet (325 mg total) by mouth daily with breakfast. 06/21/22  Yes Love, Ivan Anchors, PA-C  metoprolol succinate (TOPROL-XL) 25 MG 24 hr tablet Take 0.5 tablets (12.5 mg total) by mouth daily. 06/23/22  Yes Simmons-Robinson, Makiera, MD  Multiple Vitamin (MULTIVITAMIN WITH MINERALS) TABS tablet Take 1 tablet by mouth daily. 06/15/22  Yes Love, Ivan Anchors, PA-C  predniSONE (DELTASONE) 20 MG tablet Take 2 tablets (40 mg total) by mouth daily with breakfast for 5 days. 06/26/22 07/01/22 Yes Vanessa  Hills, MD  SIMBRINZA 1-0.2 % SUSP Place 1 drop into both eyes 2 (two) times daily. 12/07/21  Yes [provider]  timolol (TIMOPTIC) 0.5 % ophthalmic solution Place 1 drop into both eyes 2 (two) times daily. 11/16/21  Yes [provider]  acetaminophen (TYLENOL) 325 MG tablet Take 2 tablets (650 mg total) by mouth 3 (three) times daily. Wean as able 06/21/22   Love, Ivan Anchors, PA-C  albuterol (VENTOLIN HFA) 108 (90 Base) MCG/ACT inhaler Inhale 2 puffs into the lungs  every 6 (six) hours as needed for wheezing or shortness of breath. 06/26/22   Vanessa Unalakleet, MD  cyanocobalamin 1000 MCG tablet Take 0.5 tablets (500 mcg total) by mouth daily. 05/12/22 08/10/22  Simmons-Robinson, Riki Sheer, MD  diclofenac Sodium (VOLTAREN) 1 % GEL Apply 2 g topically 4 (four) times daily. 06/21/22   Love, Ivan Anchors, PA-C  furosemide  (LASIX) 20 MG tablet Take 20 mg by mouth daily. Patient not taking: Reported on 07/01/2022 06/22/22   [provider]  loratadine (CLARITIN) 10 MG tablet Take 1 tablet (10 mg total) by mouth daily. 06/21/22   Love, Ivan Anchors, PA-C  methocarbamol (ROBAXIN) 500 MG tablet Take 1 tablet (500 mg total) by mouth every 6 (six) hours as needed for muscle spasms. 06/21/22   Love, Ivan Anchors, PA-C  oxyCODONE (OXY IR/ROXICODONE) 5 MG immediate release tablet Take 1 tablet (5 mg total) by mouth every 4 (four) hours as needed for moderate pain. 06/21/22   Love, Ivan Anchors, PA-C  polyethylene glycol powder (GLYCOLAX/MIRALAX) 17 GM/SCOOP powder Take 17 g by mouth daily. 06/21/22   Love, Ivan Anchors, PA-C  Vitamin D, Ergocalciferol, (DRISDOL) 1.25 MG (50000 UNIT) CAPS capsule Take 1 capsule (50,000 Units total) by mouth every 7 (seven) days. 06/25/22   Bary Leriche, PA-C    Allergies:  Allergies  Allergen Reactions   Sulfa Antibiotics Rash    ROS:  General: Denies weight loss, weight gain, fatigue, fevers, chills, and night sweats. Eyes: Denies blurry vision, double vision, eye pain, itchy eyes, and tearing. Ears: Denies hearing loss, earache, and ringing in ears. Nose: Denies sinus pain, congestion, infections, runny nose, and nosebleeds. Mouth/throat: Denies hoarseness, sore throat, bleeding gums, and difficulty swallowing. Heart: Denies chest pain, palpitations, racing heart, irregular heartbeat, leg pain or swelling, and decreased activity tolerance. Respiratory: Denies breathing difficulty, shortness of breath, wheezing, cough, and sputum. GI: Denies change in appetite, heartburn, nausea, vomiting, constipation, diarrhea, and blood in stool. GU: Denies difficulty urinating, pain with urinating, urgency, frequency, blood in urine. Musculoskeletal: Denies joint stiffness, pain, swelling, muscle weakness. Skin: Denies rash, itching, mass, tumors, sores, and boils Neurologic: Denies headache, fainting,  dizziness, seizures, numbness, and tingling. Psychiatric: Denies depression, anxiety, difficulty sleeping, and memory loss. Endocrine: Denies heat or cold intolerance, and increased thirst or urination. Blood/lymph: Denies easy bruising, easy bruising, and swollen glands     Objective:     BP (!) 176/83   Pulse 69   Temp 98.5 F (36.9 C) (Oral)   Resp 16   Ht '5\' 4"'$  (1.626 m)   Wt 57 kg   SpO2 100%   BMI 21.57 kg/m   Constitutional :  alert, cooperative, appears stated age, and no distress  Lymphatics/Throat:  no asymmetry, masses, or scars  Respiratory:  clear to auscultation bilaterally  Cardiovascular:  regular rate and rhythm  Gastrointestinal: soft, non-tender; bowel sounds normal; no masses,  no organomegaly.  Musculoskeletal: Steady gait and movement  Skin: Cool and moist, right thigh anterior aspect about a quarter size opening noted with healthy yellow fatty subcutaneous tissue noted.  Minimal bleeding at the skin edges.  No probing was attempted in the area due to her low platelet count and lack of any active drainage.  Psychiatric: Normal affect, non-agitated, not confused       LABS:     Latest Ref Rng & Units 07/01/2022   10:32 AM 06/26/2022   10:18 AM 06/21/2022    2:23 PM  CMP  Glucose 70 -  99 mg/dL 241  132  132   BUN 8 - 23 mg/dL 66  46  43   Creatinine 0.44 - 1.00 mg/dL 1.48  1.28  1.36   Sodium 135 - 145 mmol/L 140  144  140   Potassium 3.5 - 5.1 mmol/L 4.7  4.1  4.6   Chloride 98 - 111 mmol/L 106  115  108   CO2 22 - 32 mmol/L '22  19  19   '$ Calcium 8.9 - 10.3 mg/dL 8.6  8.3  8.9   Total Protein 6.5 - 8.1 g/dL 8.9  8.9    Total Bilirubin 0.3 - 1.2 mg/dL 1.6  2.0    Alkaline Phos 38 - 126 U/L 184  242    AST 15 - 41 U/L 29  38    ALT 0 - 44 U/L 18  18        Latest Ref Rng & Units 07/01/2022   10:32 AM 06/26/2022   10:18 AM 06/20/2022    8:16 AM  CBC  WBC 4.0 - 10.5 K/uL 10.4  9.0  6.2   Hemoglobin 12.0 - 15.0 g/dL 9.0  9.1  7.4   Hematocrit  36.0 - 46.0 % 28.1  29.1  23.0   Platelets 150 - 400 K/uL 38  44  PLATELET CLUMPS NOTED ON SMEAR, UNABLE TO ESTIMATE     RADS: CLINICAL DATA:  Distal femur fracture fixation 05/26/2022.  Wound.   EXAM: RIGHT FEMUR 2 VIEWS   COMPARISON:  Radiographs 06/15/2022, 05/26/2022 and 05/25/2022. CT right hip 05/27/2022.   FINDINGS: The bones are diffusely demineralized. The right total hip arthroplasty appears unchanged with chronic osteolysis surrounding the acetabular cup. There is no evidence of loosening of the femoral component. Chronic heterotopic ossification superior to the greater trochanter. The distal lateral femoral plate and screws appear unchanged, without loosening. The comminuted fracture of the distal femoral diaphysis appears unchanged, without evidence of interval healing or bone destruction.   No recurrent soft tissue emphysema seen distally in the right thigh. There is a small amount of soft tissue gas lateral to the right greater trochanter. Extensive vascular calcifications are noted.   IMPRESSION: Unchanged appearance of the comminuted fracture of the distal femoral diaphysis status post ORIF. No evidence of interval healing or bone destruction. Small amount of soft tissue emphysema lateral to the proximal right femur.     Electronically Signed   By: Richardean Sale M.D.   On: 07/01/2022 10:58  Assessment:      Chronic recurrent right thigh wound, anterior aspect.  Plan:     Currently the wound has 0 indications of active infection.  Tender to touch in the open area but otherwise nonconcerning.  With her issue with chronically low platelets, additional wound exploration at this point will not have any great benefit.  Recommend local wound care to allow this to heal on its own.  X-rays and overall clinical exam does not indicate any issues with the recent hardware placed for her for the femur fractures.  Further care regarding her chronic issues including  her kidney disease and thrombocytopenia per hospitalist team.  Surgery team will intermittently follow for the wound while she is in-house.  She can follow-up as an outpatient once she is discharged.  The patient and granddaughter verbalized understanding and all questions were answered to the patient's satisfaction.  labs/images/medications/previous chart entries reviewed personally and relevant changes/updates noted above.

## 2022-07-01 NOTE — ED Triage Notes (Signed)
BIB [facility] from home. Three weeks post femur fx. Pt has wound on right thight. Pt has been here recently for same. Pt is also diabetic but is on prednisone so her BGL is 317. Vitals for EMS 143/65 83 hr 99% on RA.

## 2022-07-02 DIAGNOSIS — D472 Monoclonal gammopathy: Secondary | ICD-10-CM

## 2022-07-02 DIAGNOSIS — E1121 Type 2 diabetes mellitus with diabetic nephropathy: Secondary | ICD-10-CM

## 2022-07-02 LAB — BASIC METABOLIC PANEL
Anion gap: 7 (ref 5–15)
BUN: 53 mg/dL — ABNORMAL HIGH (ref 8–23)
CO2: 22 mmol/L (ref 22–32)
Calcium: 8.2 mg/dL — ABNORMAL LOW (ref 8.9–10.3)
Chloride: 113 mmol/L — ABNORMAL HIGH (ref 98–111)
Creatinine, Ser: 1.29 mg/dL — ABNORMAL HIGH (ref 0.44–1.00)
GFR, Estimated: 38 mL/min — ABNORMAL LOW (ref >60)
Glucose, Bld: 102 mg/dL — ABNORMAL HIGH (ref 70–99)
Potassium: 3.6 mmol/L (ref 3.5–5.1)
Sodium: 142 mmol/L (ref 135–145)

## 2022-07-02 LAB — VITAMIN B12: Vitamin B-12: 1449 pg/mL — ABNORMAL HIGH (ref 180–914)

## 2022-07-02 LAB — CBC
HCT: 26 % — ABNORMAL LOW (ref 36.0–46.0)
Hemoglobin: 8.3 g/dL — ABNORMAL LOW (ref 12.0–15.0)
MCH: 26.3 pg (ref 26.0–34.0)
MCHC: 31.9 g/dL (ref 30.0–36.0)
MCV: 82.5 fL (ref 80.0–100.0)
Platelets: 39 10*3/uL — ABNORMAL LOW (ref 150–400)
RBC: 3.15 MIL/uL — ABNORMAL LOW (ref 3.87–5.11)
RDW: 20.8 % — ABNORMAL HIGH (ref 11.5–15.5)
WBC: 9.9 10*3/uL (ref 4.0–10.5)
nRBC: 0.2 % (ref 0.0–0.2)

## 2022-07-02 LAB — GLUCOSE, CAPILLARY
Glucose-Capillary: 108 mg/dL — ABNORMAL HIGH (ref 70–99)
Glucose-Capillary: 147 mg/dL — ABNORMAL HIGH (ref 70–99)
Glucose-Capillary: 190 mg/dL — ABNORMAL HIGH (ref 70–99)
Glucose-Capillary: 100 mg/dL — ABNORMAL HIGH (ref 70–99)

## 2022-07-02 LAB — PHOSPHORUS: Phosphorus: 2.3 mg/dL — ABNORMAL LOW (ref 2.5–4.6)

## 2022-07-02 LAB — MAGNESIUM: Magnesium: 2.2 mg/dL (ref 1.7–2.4)

## 2022-07-02 MED ORDER — ALBUTEROL SULFATE (2.5 MG/3ML) 0.083% IN NEBU: 2.5000 mg | INHALATION_SOLUTION | Freq: Four times a day (QID) | RESPIRATORY_TRACT | Status: DC | PRN

## 2022-07-02 MED ORDER — K PHOS MONO-SOD PHOS DI & MONO 155-852-130 MG PO TABS
500.0000 mg | ORAL_TABLET | ORAL | Status: AC
  Administered 2022-07-02: 500 mg via ORAL
  Filled 2022-07-02: qty 2

## 2022-07-02 MED ORDER — VANCOMYCIN HCL IN DEXTROSE 1-5 GM/200ML-% IV SOLN
1000.0000 mg | INTRAVENOUS | Status: DC
  Filled 2022-07-02: qty 200

## 2022-07-02 NOTE — Progress Notes (Signed)
  Progress Note   [name] BEM:[id number] DOB: [birth year] DOA: 07/01/2022     1 DOS: the patient was seen and examined on 07/02/2022   Brief hospital course: AVO SCHLACHTER is a 88 y.o. female with medical history significant of type 2 diabetes, essential hypertension, arthritis who presents to the hospital with generalized weakness and a right thigh wound with infection. Patient had a recurrent right thigh wound with infection over the last few years.  Every time, she was treated with antibiotics, the wound closed up, then opened again with infection. Wound culture was sent out, blood cultures so far has no growth.  Patient is treated with vancomycin and Rocephin.  Assessment and Plan:  Right thigh wound with infection. Sepsis ruled out. Patient has a deep wound in the right thigh, with bloody drainage.  Patient had a large amount of drainage yesterday, indicating infection.  Surrounding skin are not red. Patient is seen by general surgery, no debridement is indicated. Wound culture has gram-negative Rods, Gram stain showed gram-positive cocci. I will continue current antibiotics for another day until cultures available.   Pancytopenia with anemia and thrombocytopenia. Monoclonal gammopathy of unknown significance Patient is taking B12 and iron.  Iron level adequate.  B12 level pending. Reviewed patient prior lab, condition appears to be chronic. Condition most likely related to monoclonal gammopathy, patient be referred to hematology at time of discharge.  Patient has significant thrombocytopenia at 39, wound bleeding has stopped.  No need for platelet transfusion.  We will continue to monitor.   Chronic kidney disease stage IV. Renal function stable.   Type 2 diabetes with a renal manifestation. Continue sliding scale insulin.   Essential hypertension. Resume home medicines.   Elevated troponin. History of coronary disease. Elevated troponin most likely  secondary to demand ischemia from infection.  EKG showed nonspecific ST-T changes.  No ST elevation.  Continue to monitor.      Subjective:  Patient feels well today, no fever or chills.  No nausea vomiting.  Physical Exam: Vitals:   07/01/22 1400 07/01/22 1531 07/01/22 2359 07/02/22 0806  BP: (!) 176/83 (!) 126/50 (!) 110/45 (!) 148/53  Pulse: 69 73 69 73  Resp:  '16 15 16  '$ Temp:  97.6 F (36.4 C) 97.8 F (36.6 C) 97.8 F (36.6 C)  TempSrc:   Oral   SpO2: 100% 100% 100% 100%  Weight:      Height:       General exam: Appears calm and comfortable  Respiratory system: Clear to auscultation. Respiratory effort normal. Cardiovascular system: S1 & S2 heard, RRR. No JVD, murmurs, rubs, gallops or clicks. No pedal edema. Gastrointestinal system: Abdomen is nondistended, soft and nontender. No organomegaly or masses felt. Normal bowel sounds heard. Central nervous system: Alert and oriented. No focal neurological deficits. Extremities: Symmetric 5 x 5 power. Skin: No rashes, lesions or ulcers Psychiatry: Judgement and insight appear normal. Mood & affect appropriate.   Data Reviewed:  Lab results reviewed.  Family Communication:   Disposition: Status is: Inpatient Remains inpatient appropriate because: Verity of disease, IV treatment.  Planned Discharge Destination: Home    Time spent: 35 minutes  Author: Sharen Hones, MD 07/02/2022 10:35 AM  For on call review [email or web address].

## 2022-07-02 NOTE — Plan of Care (Signed)

## 2022-07-02 NOTE — Hospital Course (Signed)
Gabriela [name] is a 88 y.o. female with medical history significant of type 2 diabetes, essential hypertension, arthritis who presents to the hospital with generalized weakness and a right thigh wound with infection. Patient had a recurrent right thigh wound with infection over the last few years.  Every time, she was treated with antibiotics, the wound closed up, then opened again with infection. Wound culture was sent out, blood cultures so far has no growth.  Patient is treated with vancomycin and Rocephin. 11/19.  Wound culture came back with ESBL [name], blood cultures negative.  Antibiotics switched to meropenem. 11/20.  Discussed from Dr. Gale Journey from infectious disease, since patient wound has opened up, and no associated cellulitis.  He no longer recommend any antibiotic treatment. Only recommended wound care.  We will set up home care for wound follow-up.

## 2022-07-02 NOTE — Plan of Care (Signed)
  Problem: Fluid Volume: Goal: Ability to maintain a balanced intake and output will improve Outcome: Progressing   Problem: Skin Integrity: Goal: Risk for impaired skin integrity will decrease Outcome: Progressing   Problem: Clinical Measurements: Goal: Diagnostic test results will improve Outcome: Progressing

## 2022-07-02 NOTE — Consult Note (Signed)
Pharmacy Antibiotic Note  Gabriela [name] is a 88 y.o. female admitted on 07/01/2022 with cellulitis.  Pharmacy has been consulted for vancomycin dosing.  11/17: Patient received vancomycin '1000mg'$  IV x1 in ED.   Plan: Increase vancomycin from 750 to '1000mg'$  IV every 48 hours Continue to monitor and dose adjust antibiotics according to renal function and indication Goal AUC 400-550  Est AUC: 518.2 Est Cmax: 36.0 Est Cmin: 11.9 Calculated with SCr 1.29 mg/dL  Vd 0.72 L/kg    Height: '5\' 4"'$  (162.6 cm) Weight: 57 kg (125 lb 10.6 oz) IBW/kg (Calculated) : 54.7  Temp (24hrs), Avg:97.9 F (36.6 C), Min:97.6 F (36.4 C), Max:98.5 F (36.9 C)  Recent Labs  Lab 06/26/22 1018 07/01/22 1017 07/01/22 1032 07/01/22 1104 07/02/22 0348  WBC 9.0  --  10.4  --  9.9  CREATININE 1.28*  --  1.48*  --  1.29*  LATICACIDVEN  --  2.7*  --  1.8  --      Estimated Creatinine Clearance: 23 mL/min (A) (by C-G formula based on SCr of 1.29 mg/dL (H)).    Allergies  Allergen Reactions   Sulfa Antibiotics Rash    Antimicrobials this admission: 11/17 vancomycin >>  11/17 ceftriaxone >>   Microbiology results: 11/17 BCx: sent 11/17 Thigh wound cx: sent  Thank you for allowing pharmacy to be a part of this patient's care.  Darrick Penna 07/02/2022 12:37 PM

## 2022-07-03 LAB — CBC
HCT: 30.4 % — ABNORMAL LOW (ref 36.0–46.0)
Hemoglobin: 9.9 g/dL — ABNORMAL LOW (ref 12.0–15.0)
MCH: 27 pg (ref 26.0–34.0)
MCHC: 32.6 g/dL (ref 30.0–36.0)
MCV: 82.8 fL (ref 80.0–100.0)
Platelets: 33 10*3/uL — ABNORMAL LOW (ref 150–400)
RBC: 3.67 MIL/uL — ABNORMAL LOW (ref 3.87–5.11)
RDW: 21.1 % — ABNORMAL HIGH (ref 11.5–15.5)
WBC: 11.1 10*3/uL — ABNORMAL HIGH (ref 4.0–10.5)
nRBC: 0 % (ref 0.0–0.2)

## 2022-07-03 LAB — GLUCOSE, CAPILLARY
Glucose-Capillary: 117 mg/dL — ABNORMAL HIGH (ref 70–99)
Glucose-Capillary: 119 mg/dL — ABNORMAL HIGH (ref 70–99)
Glucose-Capillary: 227 mg/dL — ABNORMAL HIGH (ref 70–99)

## 2022-07-03 LAB — BASIC METABOLIC PANEL
Anion gap: 12 (ref 5–15)
BUN: 45 mg/dL — ABNORMAL HIGH (ref 8–23)
CO2: 19 mmol/L — ABNORMAL LOW (ref 22–32)
Calcium: 8.6 mg/dL — ABNORMAL LOW (ref 8.9–10.3)
Chloride: 109 mmol/L (ref 98–111)
Creatinine, Ser: 1.17 mg/dL — ABNORMAL HIGH (ref 0.44–1.00)
GFR, Estimated: 43 mL/min — ABNORMAL LOW (ref >60)
Glucose, Bld: 119 mg/dL — ABNORMAL HIGH (ref 70–99)
Potassium: 3.4 mmol/L — ABNORMAL LOW (ref 3.5–5.1)
Sodium: 140 mmol/L (ref 135–145)

## 2022-07-03 MED ORDER — POTASSIUM CHLORIDE CRYS ER 20 MEQ PO TBCR
40.0000 meq | EXTENDED_RELEASE_TABLET | Freq: Once | ORAL | Status: AC
  Administered 2022-07-03: 40 meq via ORAL
  Filled 2022-07-03: qty 2

## 2022-07-03 MED ORDER — MEDIHONEY WOUND/BURN DRESSING EX PSTE
1.0000 | PASTE | Freq: Every day | CUTANEOUS | Status: DC
  Administered 2022-07-03 – 2022-07-04 (×2): 1 via TOPICAL
  Filled 2022-07-03: qty 44

## 2022-07-03 MED ORDER — SODIUM CHLORIDE 0.9 % IV SOLN
500.0000 mg | Freq: Two times a day (BID) | INTRAVENOUS | Status: DC
  Administered 2022-07-03 – 2022-07-04 (×3): 500 mg via INTRAVENOUS
  Filled 2022-07-03 (×3): qty 10

## 2022-07-03 NOTE — Plan of Care (Signed)
  Problem: Coping: Goal: Ability to adjust to condition or change in health will improve Outcome: Progressing   Problem: Fluid Volume: Goal: Ability to maintain a balanced intake and output will improve Outcome: Progressing   Problem: Health Behavior/Discharge Planning: Goal: Ability to identify and utilize available resources and services will improve Outcome: Progressing   Problem: Nutritional: Goal: Progress toward achieving an optimal weight will improve Outcome: Progressing   Problem: Skin Integrity: Goal: Risk for impaired skin integrity will decrease Outcome: Progressing   Problem: Tissue Perfusion: Goal: Adequacy of tissue perfusion will improve Outcome: Progressing   Problem: Clinical Measurements: Goal: Ability to maintain clinical measurements within normal limits will improve Outcome: Progressing   Problem: Coping: Goal: Level of anxiety will decrease Outcome: Progressing   Problem: Safety: Goal: Ability to remain free from injury will improve Outcome: Progressing

## 2022-07-03 NOTE — Consult Note (Signed)
[facility] Nurse Consult Note: Reason for Consult:Request for topical care guidance for chronic, nonhealing full thickness wound to right thigh. Dr. Lysle Pearl was simultaneously consulted and he saw the patient yesterday. No surgical procedure is indicated, he requests topical care. Wound type:full thickness Pressure Injury POA: N/A Measurement:2.5cm round with depth unable to be determined due to the presence of nonviable tissue Wound bed:see above Drainage (amount, consistency, odor) Moderate serosanguinous Periwound: intact Dressing procedure/placement/frequency:I have provided nursing with guidance for the floatation of the heels, for a sacral foam to be placed for PI prevention, for turning and repositioning and for topical acre once daily with Medihoney to the right thigh wound.  [facility] nursing team will not follow, but will remain available to this patient, the nursing and medical teams.  Please re-consult if needed.  Thank you for inviting [place] to participate in this patient's Plan of Care.  Maudie Flakes, MSN, RN, CNS, [place], Serita Grammes, [employer], [employer] phone:  [phone]

## 2022-07-03 NOTE — Progress Notes (Signed)
  Progress Note   [name] NWG:[id number] DOB: [birth year] DOA: 07/01/2022     2 DOS: the patient was seen and examined on 07/03/2022   Brief hospital course: Gabriela [name] is a 88 y.o. female with medical history significant of type 2 diabetes, essential hypertension, arthritis who presents to the hospital with generalized weakness and a right thigh wound with infection. Patient had a recurrent right thigh wound with infection over the last few years.  Every time, she was treated with antibiotics, the wound closed up, then opened again with infection. Wound culture was sent out, blood cultures so far has no growth.  Patient is treated with vancomycin and Rocephin. 11/19.  Wound culture came back with ESBL [name], blood cultures negative.  Antibiotics switched to meropenem, ID consult obtained.  Assessment and Plan: Right thigh wound with infection secondary to ESBL [name]. Sepsis ruled out. Patient has a deep wound in the right thigh, with bloody drainage.  Patient had a large amount of drainage yesterday, indicating infection.  Surrounding skin are not red. Patient is seen by general surgery, no debridement is indicated. Antibiotics was switched to meropenem, there is no other options at this moment.  ID consult obtained.   Pancytopenia with anemia and thrombocytopenia. Monoclonal gammopathy of unknown significance Patient is taking B12 and iron.  Iron level adequate, B12 level elevated. Reviewed patient prior lab, condition appears to be chronic. Condition most likely related to monoclonal gammopathy, patient be referred to hematology at time of discharge.  Continue to follow.    Chronic kidney disease stage IV. Hypokalemia. Renal function stable. Give her dose of oral potassium.   Type 2 diabetes with a renal manifestation. Continue sliding scale insulin.   Essential hypertension. Resume home medicines.   Elevated troponin. History of coronary  disease. Elevated troponin most likely secondary to demand ischemia from infection.  EKG showed nonspecific ST-T changes.  No ST elevation.        Subjective:  Patient feels well today, currently no complaints.  Physical Exam: Vitals:   07/02/22 0806 07/02/22 1649 07/03/22 0012 07/03/22 0828  BP: (!) 148/53 (!) 113/44 (!) 141/64 (!) 151/49  Pulse: 73 78 74 74  Resp: '16 16 18   '$ Temp: 97.8 F (36.6 C) 98 F (36.7 C) 98.1 F (36.7 C) (!) 97.3 F (36.3 C)  TempSrc:      SpO2: 100% 99% (!) 76% 100%  Weight:      Height:       General exam: Appears calm and comfortable  Respiratory system: Clear to auscultation. Respiratory effort normal. Cardiovascular system: S1 & S2 heard, RRR. No JVD, murmurs, rubs, gallops or clicks. No pedal edema. Gastrointestinal system: Abdomen is nondistended, soft and nontender. No organomegaly or masses felt. Normal bowel sounds heard. Central nervous system: Alert and oriented. No focal neurological deficits. Extremities: Symmetric 5 x 5 power. Skin: No rashes, lesions or ulcers Psychiatry: Judgement and insight appear normal. Mood & affect appropriate.   Data Reviewed:  Lab results reviewed.  Family Communication: Granddaughter updated.  Disposition: Status is: Inpatient Remains inpatient appropriate because: Severity of disease, IV antibiotics.  Planned Discharge Destination: Home with Home Health    Time spent: 35 minutes  Author: Sharen Hones, MD 07/03/2022 9:40 AM  For on call review [email or web address].

## 2022-07-04 ENCOUNTER — Telehealth: Payer: Self-pay | Admitting: Family Medicine

## 2022-07-04 LAB — GLUCOSE, CAPILLARY
Glucose-Capillary: 145 mg/dL — ABNORMAL HIGH (ref 70–99)
Glucose-Capillary: 114 mg/dL — ABNORMAL HIGH (ref 70–99)
Glucose-Capillary: 147 mg/dL — ABNORMAL HIGH (ref 70–99)

## 2022-07-04 MED ORDER — SODIUM BICARBONATE 650 MG PO TABS: 650.0000 mg | ORAL_TABLET | Freq: Two times a day (BID) | ORAL | 0 refills | Status: AC

## 2022-07-04 NOTE — Discharge Summary (Signed)
Physician Discharge Summary   [name] MRN: [id number] DOB: 1926/05/29  Admit date:     07/01/2022  Discharge date: 07/04/22  Discharge Physician: Sharen Hones   PCP: [name], MD   Recommendations at discharge:   Follow-up with PCP in 1 week. Home health RN to perform wound care.  Discharge Diagnoses: Principal Problem:   Wound infection Active Problems:   Type 2 diabetes mellitus with renal manifestations (HCC)   Thrombocytopenia (HCC)   CAD (coronary artery disease)   Essential (primary) hypertension   MGUS (monoclonal gammopathy of unknown significance)   CKD (chronic kidney disease) stage 4, GFR 15-29 ml/min (HCC) Mild metabolic acidosis. Resolved Problems:   * No resolved hospital problems. *  Hospital Course: Gabriela [name] is a 88 y.o. female with medical history significant of type 2 diabetes, essential hypertension, arthritis who presents to the hospital with generalized weakness and a right thigh wound with infection. Patient had a recurrent right thigh wound with infection over the last few years.  Every time, she was treated with antibiotics, the wound closed up, then opened again with infection. Wound culture was sent out, blood cultures so far has no growth.  Patient is treated with vancomycin and Rocephin. 11/19.  Wound culture came back with ESBL [name], blood cultures negative.  Antibiotics switched to meropenem. 11/20.  Discussed from Dr. Gale Journey from infectious disease, since patient wound has opened up, and no associated cellulitis.  He no longer recommend any antibiotic treatment. Only recommended wound care.  We will set up home care for wound follow-up.  Assessment and Plan: Right thigh wound with infection secondary to ESBL [name]. Sepsis ruled out. Patient has a deep wound in the right thigh, with bloody drainage.  Patient had a large amount of drainage yesterday, indicating infection.  Surrounding skin are not  red. Patient is seen by general surgery, no debridement is indicated. Patient initially treated with Rocephin, changed to meropenem since yesterday.  Patient clinically has improved, currently has a wound, drain is better.  Discussed with infect disease Dr. Gale Journey, no need for additional antibiotics.  Wound care instruction provided to visiting nurse; set up home health RN.   Pancytopenia with anemia and thrombocytopenia. Monoclonal gammopathy of unknown significance Patient is taking B12 and iron.  Iron level adequate, B12 level elevated. Reviewed patient prior lab, condition appears to be chronic. Condition most likely related to monoclonal gammopathy, patient be referred to hematology at time of discharge.  Refer to Dr. Tasia Catchings.     Chronic kidney disease stage IV. Hypokalemia. Mild metabolic acidosis. Renal function stable. Received the oral potassium for potassium of 3.4. Sodium and bicarbonate will be prescribed orally.   Type 2 diabetes with a renal manifestation. Continue sliding scale insulin.   Essential hypertension. Resume home medicines.   Elevated troponin. History of coronary disease. Elevated troponin most likely secondary to demand ischemia from infection.  EKG showed nonspecific ST-T changes.  No ST elevation.          Consultants: None Procedures performed: None  Disposition: Home health Diet recommendation:  Discharge Diet Orders (From admission, onward)     Start     Ordered   07/04/22 0000  Diet - low sodium heart healthy        07/04/22 1143           Cardiac diet DISCHARGE MEDICATION: Allergies as of 07/04/2022       Reactions   Sulfa Antibiotics Rash  Medication List     STOP taking these medications    amoxicillin-clavulanate 875-125 MG tablet Commonly known as: AUGMENTIN   azithromycin 250 MG tablet Commonly known as: Zithromax Z-Pak   furosemide 20 MG tablet Commonly known as: LASIX   predniSONE 20 MG tablet Commonly  known as: DELTASONE       TAKE these medications    acetaminophen 325 MG tablet Commonly known as: TYLENOL Take 2 tablets (650 mg total) by mouth 3 (three) times daily. Wean as able   albuterol 108 (90 Base) MCG/ACT inhaler Commonly known as: VENTOLIN HFA Inhale 2 puffs into the lungs every 6 (six) hours as needed for wheezing or shortness of breath.   Aspirin Low Dose 81 MG tablet Generic drug: aspirin EC Take 1 tablet (81 mg total) by mouth daily.   Calcium Antacid 500 MG chewable tablet Generic drug: calcium carbonate Chew 2 tablets (400 mg of elemental calcium total) by mouth daily.   cyanocobalamin 1000 MCG tablet Take 0.5 tablets (500 mcg total) by mouth daily.   diclofenac Sodium 1 % Gel Commonly known as: VOLTAREN Apply 2 g topically 4 (four) times daily.   FeroSul 325 (65 FE) MG tablet Generic drug: ferrous sulfate Take 1 tablet (325 mg total) by mouth daily with breakfast.   loratadine 10 MG tablet Commonly known as: CLARITIN Take 1 tablet (10 mg total) by mouth daily.   methocarbamol 500 MG tablet Commonly known as: ROBAXIN Take 1 tablet (500 mg total) by mouth every 6 (six) hours as needed for muscle spasms.   metoprolol succinate 25 MG 24 hr tablet Commonly known as: TOPROL-XL Take 0.5 tablets (12.5 mg total) by mouth daily.   multivitamin with minerals Tabs tablet Take 1 tablet by mouth daily.   oxyCODONE 5 MG immediate release tablet Commonly known as: Oxy IR/ROXICODONE Take 1 tablet (5 mg total) by mouth every 4 (four) hours as needed for moderate pain.   polyethylene glycol powder 17 GM/SCOOP powder Commonly known as: GLYCOLAX/MIRALAX Take 17 g by mouth daily.   Simbrinza 1-0.2 % Susp Generic drug: Brinzolamide-Brimonidine Place 1 drop into both eyes 2 (two) times daily.   timolol 0.5 % ophthalmic solution Commonly known as: TIMOPTIC Place 1 drop into both eyes 2 (two) times daily.   Vitamin D (Ergocalciferol) 1.25 MG (50000 UNIT) Caps  capsule Commonly known as: DRISDOL Take 1 capsule (50,000 Units total) by mouth every 7 (seven) days.               Discharge Care Instructions  (From admission, onward)           Start     Ordered   07/04/22 0000  Discharge wound care:       Comments: [place] RN to perform:  Cleanse with NS, pat dry. Cover with thin layer of Medihoney, top with dry gauze and secure with silicone foam. Change daily   07/04/22 1143            Follow-up Information     Simmons-Robinson, Makiera, MD Follow up in 1 week(s).   Specialty: Family Medicine Contact information: [address] [place] [address] [phone]                Discharge Exam: Danley Danker Weights   07/01/22 1015  Weight: 57 kg   General exam: Appears calm and comfortable  Respiratory system: Clear to auscultation. Respiratory effort normal. Cardiovascular system: S1 & S2 heard, RRR. No JVD, murmurs, rubs, gallops or clicks.  No pedal edema. Gastrointestinal system: Abdomen is nondistended, soft and nontender. No organomegaly or masses felt. Normal bowel sounds heard. Central nervous system: Alert and oriented. No focal neurological deficits. Extremities: Right thigh wound minimal draining. Skin: No rashes, lesions or ulcers Psychiatry: Judgement and insight appear normal. Mood & affect appropriate.    Condition at discharge: good  The results of significant diagnostics from this hospitalization (including imaging, microbiology, ancillary and laboratory) are listed below for reference.   Imaging Studies: DG Femur Min 2 Views Right  Result Date: 07/01/2022 CLINICAL DATA:  Distal femur fracture fixation 05/26/2022.  Wound. EXAM: RIGHT FEMUR 2 VIEWS COMPARISON:  Radiographs 06/15/2022, 05/26/2022 and 05/25/2022. CT right hip 05/27/2022. FINDINGS: The bones are diffusely demineralized. The right total hip arthroplasty appears unchanged with chronic osteolysis surrounding the acetabular cup.  There is no evidence of loosening of the femoral component. Chronic heterotopic ossification superior to the greater trochanter. The distal lateral femoral plate and screws appear unchanged, without loosening. The comminuted fracture of the distal femoral diaphysis appears unchanged, without evidence of interval healing or bone destruction. No recurrent soft tissue emphysema seen distally in the right thigh. There is a small amount of soft tissue gas lateral to the right greater trochanter. Extensive vascular calcifications are noted. IMPRESSION: Unchanged appearance of the comminuted fracture of the distal femoral diaphysis status post ORIF. No evidence of interval healing or bone destruction. Small amount of soft tissue emphysema lateral to the proximal right femur. Electronically Signed   By: Richardean Sale M.D.   On: 07/01/2022 10:58   CT Angio Chest PE W and/or Wo Contrast  Result Date: 06/26/2022 CLINICAL DATA:  Shortness of breath for 3 days. Dementia. Three weeks postoperative. EXAM: CT ANGIOGRAPHY CHEST WITH CONTRAST TECHNIQUE: Multidetector CT imaging of the chest was performed using the standard protocol during bolus administration of intravenous contrast. Multiplanar CT image reconstructions and MIPs were obtained to evaluate the vascular anatomy. RADIATION DOSE REDUCTION: This exam was performed according to the departmental dose-optimization program which includes automated exposure control, adjustment of the mA and/or kV according to patient size and/or use of iterative reconstruction technique. CONTRAST:  51m OMNIPAQUE IOHEXOL 350 MG/ML SOLN COMPARISON:  06/26/2022 radiographs FINDINGS: Cardiovascular: No filling defect is identified in the pulmonary arterial tree to suggest pulmonary embolus. Coronary, aortic arch, and branch vessel atherosclerotic vascular disease. Prior CABG. Mild cardiomegaly. Mitral valve prosthesis. Mediastinum/Nodes: No significant findings. Lungs/Pleura: Centrilobular  emphysema. Airway thickening most notable in the lower lobes with some mild bilateral lower lobe interstitial accentuation and subsegmental atelectasis which appears increased from 10/18/2020. There is some mild confluent scarring anteriorly in the left upper lobe on image 76 series 5. Upper Abdomen: Pneumobilia.  Cholecystectomy.  Atherosclerosis. Musculoskeletal: Thoracic spondylosis. Loss of intervertebral disc height compatible with degenerative disc disease at the T2-3 level. Review of the MIP images confirms the above findings. IMPRESSION: 1. No filling defect is identified in the pulmonary arterial tree to suggest pulmonary embolus. 2. Airway thickening most notable in the lower lobes, with some mild bilateral lower lobe interstitial accentuation and subsegmental atelectasis which appears increased from 10/18/2020. This could be from bronchitis or atypical pneumonia. 3. Mild cardiomegaly. Prior CABG. Mitral valve prosthesis. 4. Pneumobilia, likely from prior sphincterotomy. 5. Thoracic spondylosis and degenerative disc disease. 6. Aortic atherosclerosis. 7. Emphysema. Aortic Atherosclerosis (ICD10-I70.0) and Emphysema (ICD10-J43.9). Electronically Signed   By: WVan ClinesM.D.   On: 06/26/2022 13:32   DG Chest Portable 1 View  Result Date: 06/26/2022 CLINICAL DATA:  Short of breath for 3 days. EXAM: PORTABLE CHEST 1 VIEW COMPARISON:  06/20/2022 and older studies. FINDINGS: Stable changes from prior cardiac surgery and valve replacement. Cardiac silhouette is mildly enlarged. No mediastinal or hilar masses. Lungs are hyperexpanded. There are thickened interstitial markings most evident in the lower lungs, unchanged. No lung consolidation. No convincing pleural effusion and no pneumothorax. Skeletal structures are demineralized, grossly intact. IMPRESSION: 1. No acute cardiopulmonary disease. No change from the recent prior study. Electronically Signed   By: Lajean Manes M.D.   On: 06/26/2022 10:31    DG Chest 2 View  Result Date: 06/20/2022 CLINICAL DATA:  Cough. EXAM: CHEST - 2 VIEW COMPARISON:  June 11, 2022. FINDINGS: Stable cardiomediastinal silhouette. Status post coronary bypass graft and cardiac valve repair. No acute pulmonary disease is noted. Bony thorax is unremarkable. IMPRESSION: No active cardiopulmonary disease. Electronically Signed   By: Marijo Conception M.D.   On: 06/20/2022 16:28   DG Knee 1-2 Views Right  Result Date: 06/15/2022 CLINICAL DATA:  Right femur fracture. EXAM: RIGHT KNEE - 1-2 VIEW COMPARISON:  05/26/2022 FINDINGS: Lateral plate and screw fixation of mid and distal femur unchanged. Comminuted fracture supracondylar femur with displaced fragment similar to the prior study. No joint effusion. Advanced osteopenia.  Arterial calcification IMPRESSION: Comminuted supracondylar fracture distal right femur unchanged. Lateral plate and screw fixation. Electronically Signed   By: Franchot Gallo M.D.   On: 06/15/2022 10:57   DG Knee 1-2 Views Left  Result Date: 06/15/2022 CLINICAL DATA:  Femur fracture EXAM: LEFT KNEE - 1-2 VIEW COMPARISON:  06/06/2022 FINDINGS: Lateral plate and screws in the mid and distal femur unchanged. Subtle linear lucency distal supracondylar femur is less apparent on today's study likely due to projection and possibly some interval healing. Degenerative change in the knee with joint space narrowing specially laterally. Arterial calcification IMPRESSION: Lateral plate fixation of femur. Subtle transverse fracture distal supracondylar femur is less apparent on today's study. Electronically Signed   By: Franchot Gallo M.D.   On: 06/15/2022 10:56   DG Chest 1 View  Result Date: 06/11/2022 CLINICAL DATA:  Shortness of breath EXAM: CHEST  1 VIEW COMPARISON:  04/04/2021 FINDINGS: Cardiomegaly status post median sternotomy with aortic valve prosthesis. Mild, diffuse bilateral interstitial pulmonary opacity. Osseous structures unremarkable. IMPRESSION:  Cardiomegaly with mild, diffuse bilateral interstitial pulmonary opacity, likely edema. No focal airspace opacity. Electronically Signed   By: Delanna Ahmadi M.D.   On: 06/11/2022 19:26   MR KNEE LEFT WO CONTRAST  Result Date: 06/08/2022 CLINICAL DATA:  Meniscal injury, knee EXAM: MRI OF THE LEFT KNEE WITHOUT CONTRAST TECHNIQUE: Multiplanar, multisequence MR imaging of the knee was performed. No intravenous contrast was administered. COMPARISON:  Multiple prior knee radiographs FINDINGS: Plate fixation of the distal femur with associated susceptibility artifact, distorting adjacent bony and soft tissues. MENISCI Medial: No evidence of medial meniscus tear. Lateral: There is degenerative tearing of the lateral meniscal body with substance loss. LIGAMENTS Cruciates: The cruciate ligaments are incompletely evaluated due to susceptibility artifact. Collaterals: The medial collateral ligament appears intact. The distal lateral collateral ligament and biceps femoris are intact. The proximal LCL is obscured by susceptibility artifact. Popliteus tendon appears intact. CARTILAGE Patellofemoral: Obscured by susceptibility artifact. There is at least mild chondrosis. Medial:  Mild chondrosis. Lateral: Intermediate high-grade cartilage loss along the weight-bearing surfaces as evidenced by joint space narrowing. Fine detail is obscured by susceptibility artifact. JOINT: Small joint effusion. POPLITEAL FOSSA: Small Baker's cyst with a fluid-fluid level.  EXTENSOR MECHANISM: Intact quadriceps tendon. Lax but intact appearing patellar tendon. BONES: Distal femur fracture status post lateral plate fixation. There is associated marrow edema. Other: No focal fluid collection. There is soft tissue swelling along the knee. IMPRESSION: Distal femur fracture status post lateral plate fixation, with associated susceptibility artifact. Tricompartment osteoarthritis of the of the right knee, worse in the lateral compartment with  intermediate to high-grade cartilage loss along the weight-bearing surfaces, and degenerative tearing with substance loss of the lateral meniscal body. Small joint effusion. Small Baker's cyst with a fluid-fluid level suggesting presence of blood products. Electronically Signed   By: Maurine Simmering M.D.   On: 06/08/2022 12:28   DG Knee 1-2 Views Left  Result Date: 06/06/2022 CLINICAL DATA:  Left anterior knee pain for 2 weeks. EXAM: LEFT KNEE - 1-2 VIEW COMPARISON:  Knee radiograph 05/26/2022 FINDINGS: Lateral plate and screw fixation of the distal femoral shaft and metaphysis. Linear fragment of the metaphyseal region redemonstrated. Bones in anatomic alignment. Degenerative changes. Vascular calcifications. IMPRESSION: Lateral plate and screw fixation of the distal femoral shaft and metaphysis. Electronically Signed   By: Lovey Newcomer M.D.   On: 06/06/2022 16:12    Microbiology: Results for orders placed or performed during the hospital encounter of 07/01/22  Aerobic/Anaerobic Culture w Gram Stain (surgical/deep wound)     Status: None (Preliminary result)   Collection Time: 07/01/22 11:04 AM   Specimen: Thigh; Wound  Result Value Ref Range Status   Specimen Description   Final    THIGH Performed at [facility], [address], [place], [place] [address]    Special Requests   Final    NONE Performed at [facility], [place], [place] [address]    Gram Stain   Final    RARE WBC PRESENT,BOTH PMN AND MONONUCLEAR RARE GRAM POSITIVE COCCI IN PAIRS Performed at [facility], [place] [address] [place], [place] [address]    Culture   Final    RARE [name] PNEUMONIAE Confirmed Extended Spectrum Beta-Lactamase Producer (ESBL).  In bloodstream infections from ESBL organisms, carbapenems are preferred over piperacillin/tazobactam. They are shown to have a lower risk of mortality. NO ANAEROBES ISOLATED; CULTURE IN PROGRESS FOR 5 DAYS    Report Status PENDING   Incomplete   Organism ID, Bacteria [name] PNEUMONIAE  Final      Susceptibility   [name] pneumoniae - MIC*    AMPICILLIN >=32 RESISTANT Resistant     CEFAZOLIN >=64 RESISTANT Resistant     CEFEPIME >=32 RESISTANT Resistant     CEFTAZIDIME RESISTANT Resistant     CEFTRIAXONE >=64 RESISTANT Resistant     CIPROFLOXACIN >=4 RESISTANT Resistant     GENTAMICIN >=16 RESISTANT Resistant     IMIPENEM <=0.25 SENSITIVE Sensitive     TRIMETH/SULFA >=320 RESISTANT Resistant     AMPICILLIN/SULBACTAM >=32 RESISTANT Resistant     PIP/TAZO 32 INTERMEDIATE Intermediate     * RARE [name] PNEUMONIAE  Blood culture (routine x 2)     Status: None (Preliminary result)   Collection Time: 07/01/22  3:43 PM   Specimen: BLOOD  Result Value Ref Range Status   Specimen Description BLOOD LEFT ANTECUBITAL  Final   Special Requests   Final    BOTTLES DRAWN AEROBIC AND ANAEROBIC Blood Culture results may not be optimal due to an inadequate volume of blood received in culture bottles   Culture   Final    NO GROWTH 3 DAYS Performed at [facility], [place], [place] [address]    Report Status PENDING  Incomplete  Blood culture (routine x 2)     Status: None (Preliminary result)   Collection Time: 07/01/22  3:44 PM   Specimen: BLOOD  Result Value Ref Range Status   Specimen Description BLOOD BLOOD RIGHT HAND  Final   Special Requests   Final    BOTTLES DRAWN AEROBIC AND ANAEROBIC Blood Culture results may not be optimal due to an inadequate volume of blood received in culture bottles   Culture   Final    NO GROWTH 3 DAYS Performed at [facility], [address] [place], [place] [address]    Report Status PENDING  Incomplete    Labs: CBC: Recent Labs  Lab 07/01/22 1032 07/02/22 0348 07/03/22 0651  WBC 10.4 9.9 11.1*  NEUTROABS 8.1*  --   --   HGB 9.0* 8.3* 9.9*  HCT 28.1* 26.0* 30.4*  MCV 82.9 82.5 82.8  PLT 38* 39* 33*   Basic Metabolic  Panel: Recent Labs  Lab 07/01/22 1032 07/02/22 0348 07/03/22 0651  NA 140 142 140  K 4.7 3.6 3.4*  CL 106 113* 109  CO2 22 22 19*  GLUCOSE 241* 102* 119*  BUN 66* 53* 45*  CREATININE 1.48* 1.29* 1.17*  CALCIUM 8.6* 8.2* 8.6*  MG  --  2.2  --   PHOS  --  2.3*  --    Liver Function Tests: Recent Labs  Lab 07/01/22 1032  AST 29  ALT 18  ALKPHOS 184*  BILITOT 1.6*  PROT 8.9*  ALBUMIN 3.3*   CBG: Recent Labs  Lab 07/03/22 0900 07/03/22 1154 07/03/22 1711 07/03/22 2103 07/04/22 0810  GLUCAP 117* 119* 145* 227* 114*    Discharge time spent: greater than 30 minutes.  Signed: Sharen Hones, MD [facility] 07/04/2022

## 2022-07-04 NOTE — Telephone Encounter (Signed)
Gabriela [name] would like to know how she is going to get the supplies to change her wound dressing every day? The nurse only comes 3 X a week, Ie: normal saline, medi honey,dry guaze and secure w /silicone foam.  Gabriela [name] feels like she needs Rx for all this stuff.  [id number] [place] [address]

## 2022-07-04 NOTE — Plan of Care (Signed)

## 2022-07-04 NOTE — TOC Transition Note (Signed)
Transition of Care [facility]) - CM/SW Discharge Note   Patient Details  Name: Gabriela [name] MRN: [id number] Date of Birth: 09-28-1925  [facility]) CM/SW Contact:  Conception Oms, RN Phone Number: 07/04/2022, 1:32 PM   Clinical Narrative:     Patient is bedbound and needs EMS TO transport to home Called EMS to transport Daughter [name] is aware     Barriers to Discharge: Continued Medical Work up   Patient Goals and CMS Choice        Discharge Placement                       Discharge Plan and Services   Discharge Planning Services: CM Consult            DME Arranged: N/A DME Agency: NA       HH Arranged: PT, OT, Nurse's Aide [place] Agency: [place] Date [facility] Agency Contacted: 07/04/22 Time [place]: 7615 Representative spoke with at [place]: [place]  Social Determinants of Health ([place]) Interventions     Readmission Risk Interventions     No data to display

## 2022-07-04 NOTE — TOC Initial Note (Addendum)
Transition of Care [facility]) - Initial/Assessment Note    Patient Details  Name: Gabriela [name] MRN: [id number] Date of Birth: [birth year]  Transition of Care [facility]) CM/SW Contact:    Conception Oms, RN Phone Number: 07/04/2022, 10:49 AM  Clinical Narrative:                 The patient is Bedbound since 2 months ago when she had bilateral femur fracture She has a thigh wound She has HH already open with [place], notified [place] of the [place] cushion , WC, Slide Board, Hoyer, HC and drop arm commode was delivered to the home by [employer] on last admission    Expected Discharge Plan: [place] Barriers to Discharge: Continued Medical Work up   Patient Goals and CMS Choice        Expected Discharge Plan and Services Expected Discharge Plan: [place]   Discharge Planning Services: CM Consult   Living arrangements for the past 2 months: Single Family Home                 DME Arranged: N/A DME Agency: NA       HH Arranged: PT, OT, Nurse's Aide [place] Agency: [place] Date [place]: 07/04/22 Time [place]: 1049 Representative spoke with at [place]: [place]  Prior Living Arrangements/Services Living arrangements for the past 2 months: [place] Lives with:: Relatives Patient language and need for interpreter reviewed:: Yes Do you feel safe going back to the place where you live?: Yes          Current home services: DME, Homehealth aide, Home OT, Home PT ([facility] cushion , WC, Slide Board, Hoyer, HC and drop arm commode)    Activities of Daily Living Home Assistive Devices/Equipment: Environmental consultant (specify type), Eyeglasses ADL Screening (condition at time of admission) Patient's cognitive ability adequate to safely complete daily activities?: Yes Is the patient deaf or have difficulty hearing?: Yes Does the patient have difficulty seeing, even when wearing glasses/contacts?: No Does the patient have  difficulty concentrating, remembering, or making decisions?: No Patient able to express need for assistance with ADLs?: Yes Does the patient have difficulty dressing or bathing?: Yes Independently performs ADLs?: No Communication: Independent Dressing (OT): Needs assistance Is this a change from baseline?: Pre-admission baseline Grooming: Needs assistance Is this a change from baseline?: Pre-admission baseline Feeding: Independent Bathing: Needs assistance Is this a change from baseline?: Pre-admission baseline Toileting: Needs assistance Is this a change from baseline?: Pre-admission baseline In/Out Bed: Needs assistance Is this a change from baseline?: Pre-admission baseline Walks in Home: Dependent Is this a change from baseline?: Pre-admission baseline Does the patient have difficulty walking or climbing stairs?: Yes Weakness of Legs: Both Weakness of Arms/Hands: None  Permission Sought/Granted   Permission granted to share information with : Yes, Verbal Permission Granted              Emotional Assessment              Admission diagnosis:  Hyperglycemia [R73.9] Wound infection [T14.8XXA, L08.9] Open wound of right thigh, initial encounter [S71.101A] Patient Active Problem List   Diagnosis Date Noted   Wound infection 07/01/2022   Pneumonia of both lower lobes due to infectious organism 06/28/2022   Atelectasis 06/28/2022   Acute bronchitis 06/28/2022   Labored breathing 06/23/2022   Sacral wound 06/23/2022   Pressure injury of right foot, stage 1 06/23/2022  Pressure injury of skin of ischial area 06/21/2022   Pressure ulcer with suspected deep tissue injury, unstageable ([place]) 06/21/2022   Oth fracture of unsp femur, init encntr for closed fracture ([place]) 06/02/2022   Fever 05/29/2022   Leukocytosis 05/29/2022   Femur fracture, right ([place]) 05/26/2022   Femur fracture, left ([place]) 05/26/2022   CKD (chronic kidney disease) stage 4, GFR 15-29 ml/min (HCC)  05/26/2022   Comminuted fracture of right hip ([place]) 05/25/2022   Hypomagnesemia 83/66/2947   Acute metabolic encephalopathy 65/46/5035   Thrombocytopenia ([place]) 04/16/2022   Anemia of chronic disease 04/16/2022   Acute kidney injury superimposed on CKD ([place]) 04/16/2022   MGUS (monoclonal gammopathy of unknown significance) 04/16/2022   Acute right hip pain 04/16/2022   Right hip pain 04/15/2022   Chronic wound 04/15/2022   Encounter for adjustment or removal of myringotomy device (stent) (tube)    Choledocholithiasis    Common bile duct dilatation    Abnormal findings on imaging of biliary tract    History of revision of total replacement of right hip joint 01/20/2016   Abnormal LFTs (liver function tests) 04/17/2015   Anemia of diabetes 04/17/2015   Atherosclerosis of coronary artery 04/17/2015   Diabetic kidney ([place]) 04/17/2015   Essential (primary) hypertension 04/17/2015   Glaucoma 04/17/2015   HLD (hyperlipidemia) 04/17/2015   Disorder of kidney 04/17/2015   Arthritis, degenerative 04/17/2015   Arthritis, hip 04/26/2013   CAD (coronary artery disease) 04/26/2013   Ischemic mitral regurgitation 04/26/2013   Type 2 diabetes mellitus with renal manifestations ([place])    Hyperlipidemia    PCP:  [name], MD Pharmacy:   [id number] [place] [address] [place]Phone: [phone]: [phone] [id number] [place]one: [phone]: [id number]one: [phone]: [id number] [place] [facility] [place]one: [phone]: [phone] [address] [place]one: [phone]: [phone]  Social Determinants of Health (SDOH) Interventions    Readmission Risk Interventions     No data to display

## 2022-07-04 NOTE — Care Management Important Message (Signed)
Important Message  Patient Details  Name: Gabriela [name] MRN: [id number] Date of Birth: [birth year]   Medicare Important Message Given:  N/A - LOS <3 / Initial given by admissions     Juliann Pulse A Amedio Bowlby 07/04/2022, 12:03 PM

## 2022-07-05 ENCOUNTER — Ambulatory Visit: Payer: Medicare Other | Admitting: Internal Medicine

## 2022-07-05 LAB — AEROBIC/ANAEROBIC CULTURE W GRAM STAIN (SURGICAL/DEEP WOUND)

## 2022-07-05 NOTE — Telephone Encounter (Signed)
Per [name], Nurse came yesterday to take care of the wound. Tammy told her she was going to place the order for the supplies but they need to change patient's wound daily and she doesn't have the supplies. Tammy number is [phone] with [facility].   Per Lynelle Smoke, RN with [facility]. She just left the patient's house and reports that she left extra gauzes for her. The wound have some tan drainage that she is not worry about. She is going to placed the order today for the supply. Reports that patient's insurance only approved 1 time a week for wound care. Per RN patient's daughter went a bought the [employer] OTC and so far they have everything at home until the rest of the supplies come in.   Verbal order was given for a RN visit one time weekly and PT/OT evaluations.

## 2022-07-05 NOTE — Telephone Encounter (Signed)
[place] for verbal orders for wound care supplies as recommended by wound care.     [name], MD  [facility]

## 2022-07-05 NOTE — Telephone Encounter (Signed)
Copied from [place] [id number]. Topic: General - Inquiry >> Jul 05, 2022 11:19 AM Marcellus Scott wrote: Reason for CRM:Pt daughter is calling in regards to upcoming appointment on 11/22. Pt daughter would like to confirm that PCP will be calling pt for appointment as pt is bed bound.  Please advise.

## 2022-07-06 ENCOUNTER — Telehealth (INDEPENDENT_AMBULATORY_CARE_PROVIDER_SITE_OTHER): Payer: Medicare Other | Admitting: Family Medicine

## 2022-07-06 ENCOUNTER — Encounter: Payer: Self-pay | Admitting: Family Medicine

## 2022-07-06 ENCOUNTER — Telehealth: Payer: Self-pay | Admitting: *Deleted

## 2022-07-06 VITALS — BP 110/50 | HR 74

## 2022-07-06 DIAGNOSIS — T148XXA Other injury of unspecified body region, initial encounter: Secondary | ICD-10-CM

## 2022-07-06 DIAGNOSIS — N184 Chronic kidney disease, stage 4 (severe): Secondary | ICD-10-CM

## 2022-07-06 DIAGNOSIS — I10 Essential (primary) hypertension: Secondary | ICD-10-CM | POA: Diagnosis not present

## 2022-07-06 LAB — CULTURE, BLOOD (ROUTINE X 2)
Culture: NO GROWTH
Culture: NO GROWTH

## 2022-07-06 NOTE — Assessment & Plan Note (Signed)
Patient's blood pressure measured via video call for virtual hospital follow-up Blood pressure initially 110/50 and followed by 106/43 on recheck  Recommended holding the metoprolol 1/2 tablet daily and checking blood pressures on 07/07/2022 If blood pressures are in normal range after holding metoprolol, recommended that patient's granddaughter continue to hold metoprolol and notify our office if blood pressures are persistently elevated greater than 160/100 Patient's granddaughter was in agreement with this

## 2022-07-06 NOTE — Assessment & Plan Note (Signed)
Patient was started on sodium and bicarbonate for the next 1 month Recommended CMP to repeat levels after month of supplementation We will see if this can be arranged with home health RN and results communicated to our office  CMP in 1 month

## 2022-07-06 NOTE — Progress Notes (Signed)
I,Gabriela [name],acting as a scribe for [employer], MD.,have documented all relevant documentation on the behalf of Gabriela Foster, MD,as directed by  Gabriela Foster, MD while in the presence of Gabriela Foster, MD.   MyChart Video Visit    Virtual Visit via Video Note   This format is felt to be most appropriate for this patient at this time. Physical exam was limited by quality of the video and audio technology used for the visit.   Patient location: Patient's home address Provider location:  Gabriela [name] [address], [place]  Gabriela [name], Gabriela [name] Gabriela [name]   I discussed the limitations of evaluation and management by telemedicine and the availability of in person appointments. The patient expressed understanding and agreed to proceed.  [name]   DOB: 1926/07/16   88 y.o. Female  MRN: [id number] Visit Date: 07/06/2022  Today's healthcare provider: [name], MD   Chief Complaint  Patient presents with   Hospitalization Follow-up   Subjective    HPI   Hospital Follow up, Fall   Gabriela [name] is a 88 y.o. female with hx notable for hypertension, CAD, mitral regurgitation, recent pneumonia diagnosis, type 2 diabetes & chronic kidney disease presenting for hospital follow up for open wound of right thigh.  She is in her home and accompanied by her granddaughter, Gabriela [name], via video visit as patient has difficulty with leaving the home and has been  receiving care in home.     Hx obtained partially from patient and partially from chart review:   Patient was admitted on 07/01/2022 and discharged on 07/04/2022 after diagnosis and treatment for wound infection  Disposition was to home with Gabriela [name]   Notable events & treatment during hospitalization include:  -Patient's wound was positive for ESBL [name]  Medication Changes at discharge:  -Antibiotics were  discontinued -She was started on sodium and bicarbonate for 1 month   Other Follow Up since hospital D/C:  -Home health wound care    Medications: Outpatient Medications Prior to Visit  Medication Sig   acetaminophen (TYLENOL) 325 MG tablet Take 2 tablets (650 mg total) by mouth 3 (three) times daily. Wean as able   albuterol (VENTOLIN HFA) 108 (90 Base) MCG/ACT inhaler Inhale 2 puffs into the lungs every 6 (six) hours as needed for wheezing or shortness of breath.   aspirin EC 81 MG tablet Take 1 tablet (81 mg total) by mouth daily.   calcium carbonate (TUMS - DOSED IN MG ELEMENTAL CALCIUM) 500 MG chewable tablet Chew 2 tablets (400 mg of elemental calcium total) by mouth daily.   cyanocobalamin 1000 MCG tablet Take 0.5 tablets (500 mcg total) by mouth daily.   diclofenac Sodium (VOLTAREN) 1 % GEL Apply 2 g topically 4 (four) times daily.   ferrous sulfate 325 (65 FE) MG tablet Take 1 tablet (325 mg total) by mouth daily with breakfast.   loratadine (CLARITIN) 10 MG tablet Take 1 tablet (10 mg total) by mouth daily.   methocarbamol (ROBAXIN) 500 MG tablet Take 1 tablet (500 mg total) by mouth every 6 (six) hours as needed for muscle spasms.   metoprolol succinate (TOPROL-XL) 25 MG 24 hr tablet Take 0.5 tablets (12.5 mg total) by mouth daily.   Multiple Vitamin (MULTIVITAMIN WITH MINERALS) TABS tablet Take 1 tablet by mouth daily.   oxyCODONE (OXY IR/ROXICODONE) 5 MG immediate release tablet Take 1 tablet (5 mg total) by mouth every 4 (four) hours as needed for moderate pain.  polyethylene glycol powder (GLYCOLAX/MIRALAX) 17 GM/SCOOP powder Take 17 g by mouth daily.   SIMBRINZA 1-0.2 % SUSP [name] 1 drop into both eyes 2 (two) times daily.   sodium bicarbonate 650 MG tablet Take 1 tablet (650 mg total) by mouth 2 (two) times daily.   timolol (TIMOPTIC) 0.5 % ophthalmic solution [name] 1 drop into both eyes 2 (two) times daily.   Vitamin D, Ergocalciferol, (DRISDOL) 1.25 MG (50000 UNIT)  CAPS capsule Take 1 capsule (50,000 Units total) by mouth every 7 (seven) days.   No facility-administered medications prior to visit.    Review of Systems     Objective    BP (!) 110/50 (BP Location: Right Arm, Patient Position: Sitting, Cuff Size: Normal)   Pulse 74   SpO2 96%      Physical Exam Vitals reviewed.  Constitutional:      General: She is not in acute distress.    Appearance: Normal appearance. She is not ill-appearing or toxic-appearing.     Comments: Patient is sitting upright in the room does not appear in any acute distress Patient is pleasant and smiling and conversational speaking in clear sentences   Pulmonary:     Effort: Pulmonary effort is normal. No respiratory distress.  Neurological:     Mental Status: She is alert.        Assessment & Plan     Problem List Items Addressed This Visit       Cardiovascular and Mediastinum   Essential (primary) hypertension - Primary    Patient's blood pressure measured via video call for virtual hospital follow-up Blood pressure initially 110/50 and followed by 106/43 on recheck  Recommended holding the metoprolol 1/2 tablet daily and checking blood pressures on 07/07/2022 If blood pressures are in normal range after holding metoprolol, recommended that patient's granddaughter continue to hold metoprolol and notify our office if blood pressures are persistently elevated greater than 160/100 Patient's granddaughter was in agreement with this         Genitourinary   CKD (chronic kidney disease) stage 4, GFR 15-29 ml/min (HCC) (Chronic)    Patient was started on sodium and bicarbonate for the next 1 month Recommended CMP to repeat levels after month of supplementation We will see if this can be arranged with home health RN and results communicated to our office  CMP in 1 month          Other   Chronic wound    Patient's granddaughter has sent updated imaging Patient diagnosed with ESBL [name]  and treated with meropenem while hospitalized He is not currently on any antibiotics Patient had negative blood cultures, wound culture results noted [name] ESBL as noted above Patient granddaughter states that she now has wound care supplies and be home health nurse will provide care once weekly, patient's granddaughter plans to clean and dress the wound every other day of the week  Overall wound appears to be healing and patient denies pain, fever, chills        Return in about 5 weeks (around 08/10/2022).     I discussed the assessment and treatment plan with the patient. The patient was provided an opportunity to ask questions and all were answered. The patient agreed with the plan and demonstrated an understanding of the instructions.   The patient was advised to call back or seek an in-person evaluation if the symptoms worsen or if the condition fails to improve as anticipated.  I provided 25 minutes of non-face-to-face time during  this encounter.  I, Gabriela Foster, MD, have reviewed all documentation for this visit.  Portions of this information were initially documented by the CMA and reviewed by me for thoroughness and accuracy.     Gabriela Foster, MD [facility] [phone] (phone) [phone] (fax)  [place]

## 2022-07-06 NOTE — Patient Outreach (Signed)
  Care Coordination TOC Note Transition Care Management Follow-up Telephone Call Date of discharge and from where: [facility] [address] How have you been since you were released from the hospital? Still weak and need the PT/OT Any questions or concerns? Yes  Items Reviewed: Did the pt receive and understand the discharge instructions provided? Yes  Medications obtained and verified? Yes  Other? No  Any new allergies since your discharge? No  Dietary orders reviewed? No Do you have support at home? No   Home Care and Equipment/Supplies: Were home health services ordered? yes If so, what is the name of the agency? [facility]  Has the agency set up a time to come to the patient's home? Per patient daughter [facility] did not get the order for PT/OT Were any new equipment or medical supplies ordered?  No What is the name of the medical supply agency? N/A Were you able to get the supplies/equipment? no Do you have any questions related to the use of the equipment or supplies? No  Functional Questionnaire: (I = Independent and D = Dependent) ADLs: D  Bathing/Dressing- D  Meal Prep- D  Eating- I  Maintaining continence- D  Transferring/Ambulation- D  Managing Meds- D  Follow up appointments reviewed:  PCP Hospital f/u appt confirmed? Yes  Scheduled to see Dr Alba Cory  on 07/06/2022 1:20 PM [place] [facility] f/u appt confirmed? N Are transportation arrangements needed? No Video call If their condition worsens, is the pt aware to call PCP or go to the Emergency Dept.? Yes Was the patient provided with contact information for the PCP's office or ED? Yes Was to pt encouraged to call back with questions or concerns? Yes  SDOH assessments and interventions completed:   Yes  Care Coordination Interventions Activated:  Yes   Care Coordination Interventions:  RN followed up with [facility] on the orders    Encounter Outcome:  Pt. Visit Completed    [place] [facility] [phone]

## 2022-07-06 NOTE — Assessment & Plan Note (Signed)
Patient's granddaughter has sent updated imaging Patient diagnosed with ESBL [name] and treated with meropenem while hospitalized He is not currently on any antibiotics Patient had negative blood cultures, wound culture results noted [name] ESBL as noted above Patient granddaughter states that she now has wound care supplies and be home health nurse will provide care once weekly, patient's granddaughter plans to clean and dress the wound every other day of the week  Overall wound appears to be healing and patient denies pain, fever, chills

## 2022-07-13 ENCOUNTER — Telehealth: Payer: Self-pay

## 2022-07-13 DIAGNOSIS — M21371 Foot drop, right foot: Secondary | ICD-10-CM

## 2022-07-13 DIAGNOSIS — L89610 Pressure ulcer of right heel, unstageable: Secondary | ICD-10-CM | POA: Diagnosis not present

## 2022-07-13 DIAGNOSIS — D696 Thrombocytopenia, unspecified: Secondary | ICD-10-CM

## 2022-07-13 DIAGNOSIS — I129 Hypertensive chronic kidney disease with stage 1 through stage 4 chronic kidney disease, or unspecified chronic kidney disease: Secondary | ICD-10-CM | POA: Diagnosis not present

## 2022-07-13 DIAGNOSIS — J439 Emphysema, unspecified: Secondary | ICD-10-CM

## 2022-07-13 DIAGNOSIS — F0390 Unspecified dementia without behavioral disturbance: Secondary | ICD-10-CM

## 2022-07-13 DIAGNOSIS — E1140 Type 2 diabetes mellitus with diabetic neuropathy, unspecified: Secondary | ICD-10-CM

## 2022-07-13 DIAGNOSIS — D631 Anemia in chronic kidney disease: Secondary | ICD-10-CM

## 2022-07-13 DIAGNOSIS — S72401D Unspecified fracture of lower end of right femur, subsequent encounter for closed fracture with routine healing: Secondary | ICD-10-CM | POA: Diagnosis not present

## 2022-07-13 DIAGNOSIS — D472 Monoclonal gammopathy: Secondary | ICD-10-CM

## 2022-07-13 DIAGNOSIS — E1122 Type 2 diabetes mellitus with diabetic chronic kidney disease: Secondary | ICD-10-CM

## 2022-07-13 DIAGNOSIS — D630 Anemia in neoplastic disease: Secondary | ICD-10-CM

## 2022-07-13 DIAGNOSIS — S72462D Displaced supracondylar fracture with intracondylar extension of lower end of left femur, subsequent encounter for closed fracture with routine healing: Secondary | ICD-10-CM | POA: Diagnosis not present

## 2022-07-13 DIAGNOSIS — I2510 Atherosclerotic heart disease of native coronary artery without angina pectoris: Secondary | ICD-10-CM

## 2022-07-13 DIAGNOSIS — N189 Chronic kidney disease, unspecified: Secondary | ICD-10-CM

## 2022-07-13 NOTE — Telephone Encounter (Signed)
Charaine, OT from [place] called requesting verbal orders for HHOT 1wk6. Orders approved and given.

## 2022-07-15 ENCOUNTER — Telehealth: Payer: Self-pay | Admitting: Family Medicine

## 2022-07-15 NOTE — Telephone Encounter (Signed)
Please advise 

## 2022-07-15 NOTE — Telephone Encounter (Signed)
Copied from [place] [phone]. Topic: Quick Communication - Home Health Verbal Orders >> Jul 15, 2022 10:44 AM [place] C wrote: Caller/Agency: [facility]  Callback Number: [phone] Requesting OT/PT/Skilled Nursing/Social Work/Speech Therapy: PT Frequency: 2w2 1w2 starting 07/19/22

## 2022-07-15 NOTE — Telephone Encounter (Signed)
Lucas Mallow with [facility] advised of approved verbal orders. [phone].

## 2022-07-15 NOTE — Telephone Encounter (Signed)
Ok for verbal orders as requested in MyChart message dated for 07/15/22   [name], MD  [facility]

## 2022-07-20 ENCOUNTER — Telehealth: Payer: Self-pay

## 2022-07-20 ENCOUNTER — Encounter: Payer: Self-pay | Admitting: Family Medicine

## 2022-07-22 ENCOUNTER — Encounter: Payer: Medicare Other | Admitting: Physical Medicine and Rehabilitation

## 2022-08-09 NOTE — Progress Notes (Unsigned)
MyChart Video Visit    Virtual Visit via Video Note   This format is felt to be most appropriate for this patient at this time. Physical exam was limited by quality of the video and audio technology used for the visit.   Patient location: *** Provider location: ***  I discussed the limitations of evaluation and management by telemedicine and the availability of in person appointments. The patient expressed understanding and agreed to proceed.  [name]   DOB: 1926-04-13   88 y.o. Female  MRN: [id number] Visit Date: 08/10/2022  Today's healthcare provider: [name], MD   No chief complaint on file.  Subjective    HPI  Hypertension, follow-up  BP Readings from Last 3 Encounters:  07/06/22 (!) 110/50  07/04/22 130/60  06/28/22 120/60   Wt Readings from Last 3 Encounters:  07/01/22 125 lb 10.6 oz (57 kg)  06/26/22 125 lb 10.6 oz (57 kg)  06/22/22 126 lb 12.2 oz (57.5 kg)     She was last seen for hypertension 5 weeks ago.  BP at that visit was 110/50. Management since that visit includes Recommended holding the metoprolol 1/2 tablet daily and checking blood pressures .  Outside blood pressures are {***enter patient reported home BP readings, or 'not being checked':1}.   Medications: Outpatient Medications Prior to Visit  Medication Sig   acetaminophen (TYLENOL) 325 MG tablet Take 2 tablets (650 mg total) by mouth 3 (three) times daily. Wean as able   albuterol (VENTOLIN HFA) 108 (90 Base) MCG/ACT inhaler Inhale 2 puffs into the lungs every 6 (six) hours as needed for wheezing or shortness of breath.   aspirin EC 81 MG tablet Take 1 tablet (81 mg total) by mouth daily.   calcium carbonate (TUMS - DOSED IN MG ELEMENTAL CALCIUM) 500 MG chewable tablet Chew 2 tablets (400 mg of elemental calcium total) by mouth daily.   cyanocobalamin 1000 MCG tablet Take 0.5 tablets (500 mcg total) by mouth daily.   diclofenac Sodium (VOLTAREN) 1 % GEL  Apply 2 g topically 4 (four) times daily.   ferrous sulfate 325 (65 FE) MG tablet Take 1 tablet (325 mg total) by mouth daily with breakfast.   loratadine (CLARITIN) 10 MG tablet Take 1 tablet (10 mg total) by mouth daily.   methocarbamol (ROBAXIN) 500 MG tablet Take 1 tablet (500 mg total) by mouth every 6 (six) hours as needed for muscle spasms.   metoprolol succinate (TOPROL-XL) 25 MG 24 hr tablet Take 0.5 tablets (12.5 mg total) by mouth daily.   Multiple Vitamin (MULTIVITAMIN WITH MINERALS) TABS tablet Take 1 tablet by mouth daily.   oxyCODONE (OXY IR/ROXICODONE) 5 MG immediate release tablet Take 1 tablet (5 mg total) by mouth every 4 (four) hours as needed for moderate pain.   polyethylene glycol powder (GLYCOLAX/MIRALAX) 17 GM/SCOOP powder Take 17 g by mouth daily.   SIMBRINZA 1-0.2 % SUSP Place 1 drop into both eyes 2 (two) times daily.   timolol (TIMOPTIC) 0.5 % ophthalmic solution Place 1 drop into both eyes 2 (two) times daily.   Vitamin D, Ergocalciferol, (DRISDOL) 1.25 MG (50000 UNIT) CAPS capsule Take 1 capsule (50,000 Units total) by mouth every 7 (seven) days.   No facility-administered medications prior to visit.    Review of Systems  {Labs  Heme  Chem  Endocrine  Serology  Results Review (optional):23779}   Objective    There were no vitals taken for this visit.  {Show previous vital signs (  optional):23777}   Physical Exam     Assessment & Plan     ***  No follow-ups on file.     I discussed the assessment and treatment plan with the patient. The patient was provided an opportunity to ask questions and all were answered. The patient agreed with the plan and demonstrated an understanding of the instructions.   The patient was advised to call back or seek an in-person evaluation if the symptoms worsen or if the condition fails to improve as anticipated.  I provided *** minutes of non-face-to-face time during this encounter.  {provider  attestation***:1}  [name], MD [facility] [phone] (phone) [phone] (fax)  [place]

## 2022-08-10 ENCOUNTER — Telehealth (INDEPENDENT_AMBULATORY_CARE_PROVIDER_SITE_OTHER): Payer: Medicare Other | Admitting: Family Medicine

## 2022-08-10 ENCOUNTER — Encounter: Payer: Self-pay | Admitting: Family Medicine

## 2022-08-10 VITALS — BP 126/66 | HR 78

## 2022-08-10 DIAGNOSIS — M7989 Other specified soft tissue disorders: Secondary | ICD-10-CM | POA: Diagnosis not present

## 2022-08-10 DIAGNOSIS — E1159 Type 2 diabetes mellitus with other circulatory complications: Secondary | ICD-10-CM | POA: Diagnosis not present

## 2022-08-10 DIAGNOSIS — D649 Anemia, unspecified: Secondary | ICD-10-CM | POA: Diagnosis not present

## 2022-08-10 DIAGNOSIS — I10 Essential (primary) hypertension: Secondary | ICD-10-CM | POA: Diagnosis not present

## 2022-08-10 MED ORDER — LORATADINE 10 MG PO TABS: 10.0000 mg | ORAL_TABLET | Freq: Every day | ORAL | 0 refills | Status: DC

## 2022-08-10 MED ORDER — FERROUS SULFATE 325 (65 FE) MG PO TABS: 325.0000 mg | ORAL_TABLET | Freq: Every day | ORAL | 2 refills | Status: DC

## 2022-08-10 MED ORDER — SODIUM BICARBONATE 650 MG PO TABS: 650.0000 mg | ORAL_TABLET | Freq: Two times a day (BID) | ORAL | 0 refills | Status: DC

## 2022-08-10 NOTE — Assessment & Plan Note (Signed)
Suspect this could be related to recent orthopedic surgeries vs renal function changes  Will plan to collect CMP at next visit in Jan 2024  Has nephrology appt 09/15/22  Recommended elevation of lowe extremities while seated and continuing to stand as much as tolerated throughout the day  Discussed ED precautions including increased swelling, redness, warmth to touch, patient's granddaughter voiced understanding and was in agreement with this plan

## 2022-08-10 NOTE — Assessment & Plan Note (Signed)
A1c 6.8, at goal  Well controlled with diet CBG ranges from 106-139 max  Will continue with diet control

## 2022-08-10 NOTE — Assessment & Plan Note (Signed)
Controlled BP at goal Continue current medications at current doses,metoprolol 12.'5mg'$  daily  No medications changes today  Will order CMP for next visit on 08/18/22

## 2022-08-15 DIAGNOSIS — S728X9A Other fracture of unspecified femur, initial encounter for closed fracture: Secondary | ICD-10-CM | POA: Diagnosis not present

## 2022-08-15 DIAGNOSIS — T148XXA Other injury of unspecified body region, initial encounter: Secondary | ICD-10-CM | POA: Diagnosis not present

## 2022-08-16 ENCOUNTER — Telehealth: Payer: Self-pay | Admitting: Family Medicine

## 2022-08-16 NOTE — Telephone Encounter (Signed)
Gabriela [name] home health would like to extend pt's current PT appts to  1 wk 2 , starting this week.  Cb [phone]

## 2022-08-16 NOTE — Telephone Encounter (Signed)
Verbal orders given  

## 2022-08-16 NOTE — Telephone Encounter (Signed)
[place] for verbal orders to extend services    Gabriela Foster, MD  [facility]

## 2022-08-18 ENCOUNTER — Ambulatory Visit (INDEPENDENT_AMBULATORY_CARE_PROVIDER_SITE_OTHER): Payer: Medicare Other | Admitting: Family Medicine

## 2022-08-18 ENCOUNTER — Encounter: Payer: Self-pay | Admitting: Family Medicine

## 2022-08-18 ENCOUNTER — Other Ambulatory Visit: Payer: Self-pay | Admitting: Family Medicine

## 2022-08-18 VITALS — BP 162/61 | HR 71 | Temp 98.2°F | Resp 16

## 2022-08-18 DIAGNOSIS — I10 Essential (primary) hypertension: Secondary | ICD-10-CM

## 2022-08-18 DIAGNOSIS — M7989 Other specified soft tissue disorders: Secondary | ICD-10-CM

## 2022-08-18 MED ORDER — FUROSEMIDE 20 MG PO TABS: 20.0000 mg | ORAL_TABLET | ORAL | 0 refills | Status: DC | PRN

## 2022-08-18 NOTE — Progress Notes (Signed)
I,Sulibeya S Dimas,acting as a Education administrator for [employer], MD.,have documented all relevant documentation on the behalf of [name], MD,as directed by  [name], MD while in the presence of [name], MD.     Established patient visit   [name]   DOB: 1925-10-03   89 y.o. Female  MRN: [id number] Visit Date: 08/18/2022  Today's healthcare provider: [name], MD   Chief Complaint  Patient presents with   Edema   Subjective    HPI   Follow Up for leg swelling  Patient presents with her grand daugther for leg swelling  They state that after taking the sodium bicarbonate, the legs started improving with decreased swelling to normal after two days and then today the legs started to be swollen again overnight last night   Home Health  PT/OT  Patient's granddaughter reports that she was told they would be discharging the patient because she was not improving enough for insurance coverage so services would be stopped  OT she reports that they wanted replacement therapist because they weren't satisfied with provider then had another person come for one evaluation  She was seen by PT once per week  [facility]  Reviewed orders requested on 08/16/22; verbals have called back   HTN  She is still doing well with BP    Medications: Outpatient Medications Prior to Visit  Medication Sig   acetaminophen (TYLENOL) 325 MG tablet Take 2 tablets (650 mg total) by mouth 3 (three) times daily. Wean as able   albuterol (VENTOLIN HFA) 108 (90 Base) MCG/ACT inhaler Inhale 2 puffs into the lungs every 6 (six) hours as needed for wheezing or shortness of breath.   aspirin EC 81 MG tablet Take 1 tablet (81 mg total) by mouth daily.   calcium carbonate (TUMS - DOSED IN MG ELEMENTAL CALCIUM) 500 MG chewable tablet Chew 2 tablets (400 mg of elemental calcium total) by mouth daily.   diclofenac Sodium (VOLTAREN) 1  % GEL Apply 2 g topically 4 (four) times daily.   ferrous sulfate 325 (65 FE) MG tablet Take 1 tablet (325 mg total) by mouth daily with breakfast.   loratadine (CLARITIN) 10 MG tablet Take 1 tablet (10 mg total) by mouth daily.   metoprolol succinate (TOPROL-XL) 25 MG 24 hr tablet Take 0.5 tablets (12.5 mg total) by mouth daily.   Multiple Vitamin (MULTIVITAMIN WITH MINERALS) TABS tablet Take 1 tablet by mouth daily.   polyethylene glycol powder (GLYCOLAX/MIRALAX) 17 GM/SCOOP powder Take 17 g by mouth daily.   SIMBRINZA 1-0.2 % SUSP Place 1 drop into both eyes 2 (two) times daily.   sodium bicarbonate 650 MG tablet Take 1 tablet (650 mg total) by mouth 2 (two) times daily.   timolol (TIMOPTIC) 0.5 % ophthalmic solution Place 1 drop into both eyes 2 (two) times daily.   Vitamin D, Ergocalciferol, (DRISDOL) 1.25 MG (50000 UNIT) CAPS capsule Take 1 capsule (50,000 Units total) by mouth every 7 (seven) days.   methocarbamol (ROBAXIN) 500 MG tablet Take 1 tablet (500 mg total) by mouth every 6 (six) hours as needed for muscle spasms. (Patient not taking: Reported on 08/18/2022)   oxyCODONE (OXY IR/ROXICODONE) 5 MG immediate release tablet Take 1 tablet (5 mg total) by mouth every 4 (four) hours as needed for moderate pain. (Patient not taking: Reported on 08/18/2022)   No facility-administered medications prior to visit.    Review of Systems  Constitutional:  Negative for chills.  Respiratory:  Negative for chest tightness and shortness of breath.   Cardiovascular:  Positive for leg swelling. Negative for chest pain.       Objective    BP (!) 162/61 (BP Location: Left Arm, Patient Position: Sitting, Cuff Size: Large)   Pulse 71   Temp 98.2 F (36.8 C) (Oral)   Resp 16   SpO2 99%    Physical Exam Vitals reviewed.  Constitutional:      General: She is not in acute distress.    Appearance: Normal appearance. She is not ill-appearing, toxic-appearing or diaphoretic.  Eyes:      Conjunctiva/sclera: Conjunctivae normal.  Cardiovascular:     Rate and Rhythm: Normal rate and regular rhythm.     Pulses: Normal pulses.     Heart sounds: Normal heart sounds. No murmur heard.    No friction rub. No gallop.  Pulmonary:     Effort: Pulmonary effort is normal. No respiratory distress.     Breath sounds: Normal breath sounds. No stridor. No wheezing, rhonchi or rales.  Abdominal:     General: Bowel sounds are normal. There is no distension.     Palpations: Abdomen is soft.     Tenderness: There is no abdominal tenderness.  Musculoskeletal:     Right lower leg: Edema present.     Left lower leg: Edema present.     Comments: Sacral pitting edema, edematous RLE up to thigh, no erythema, no tenderness to palpation    Skin:    Findings: No erythema or rash.  Neurological:     Mental Status: She is alert and oriented to person, place, and time.       Results for orders placed or performed in visit on 08/18/22  Comprehensive metabolic panel  Result Value Ref Range   Glucose 99 70 - 99 mg/dL   BUN 19 10 - 36 mg/dL   Creatinine, Ser 0.95 0.57 - 1.00 mg/dL   eGFR 56 (L) >59 mL/min/1.73   BUN/Creatinine Ratio 20 12 - 28   Sodium 145 (H) 134 - 144 mmol/L   Potassium 3.0 (L) 3.5 - 5.2 mmol/L   Chloride 111 (H) 96 - 106 mmol/L   CO2 21 20 - 29 mmol/L   Calcium 8.2 (L) 8.7 - 10.3 mg/dL   Total Protein 6.7 6.0 - 8.5 g/dL   Albumin 3.5 (L) 3.6 - 4.6 g/dL   Globulin, Total 3.2 1.5 - 4.5 g/dL   Albumin/Globulin Ratio 1.1 (L) 1.2 - 2.2   Bilirubin Total 0.6 0.0 - 1.2 mg/dL   Alkaline Phosphatase 199 (H) 44 - 121 IU/L   AST 15 0 - 40 IU/L   ALT 10 0 - 32 IU/L  Magnesium  Result Value Ref Range   Magnesium 1.6 1.6 - 2.3 mg/dL    Assessment & Plan     Problem List Items Addressed This Visit       Cardiovascular and Mediastinum   Essential (primary) hypertension    BP elevated in office  Recommended no med changes today, suspect swelling is related to increase in BP   She will continue metoprolol as prescribed       Relevant Medications   furosemide (LASIX) 20 MG tablet     Other   Leg swelling - Primary    CMP and magnesium collected today  Suspect that this is likely related to kidney function changes  Encouraged patient and family to keep nephrology appt for 09/15/22  Discussed sparingly using '20mg'$  lasix for swelling PRN  Relevant Medications   furosemide (LASIX) 20 MG tablet   Other Relevant Orders   Comprehensive metabolic panel (Completed)   Magnesium (Completed)     Return in about 10 days (around 08/28/2022) for virtual for HTN, swelling .      I, [name], MD, have reviewed all documentation for this visit.  Portions of this information were initially documented by the CMA and reviewed by me for thoroughness and accuracy.      [name], MD  [facility] [phone] (phone) [phone] (fax)  [place]

## 2022-08-18 NOTE — Patient Instructions (Addendum)
Take one '20mg'$  lasix every other day for the next three days for leg swelling   We will collect blood work today and follow up with any abnormal results.   Please plan to follow up in 10-14 days for BP check and medication management.    Please continue to record blood pressure measurements daily and seek treatment if you begin to have persistent headaches, chest pain, lightheadedness or dizziness.   We have consented to continue home home orders as of 08/16/22

## 2022-08-19 ENCOUNTER — Encounter: Payer: Self-pay | Admitting: Family Medicine

## 2022-08-19 DIAGNOSIS — D472 Monoclonal gammopathy: Secondary | ICD-10-CM | POA: Diagnosis not present

## 2022-08-19 DIAGNOSIS — D696 Thrombocytopenia, unspecified: Secondary | ICD-10-CM | POA: Diagnosis not present

## 2022-08-19 DIAGNOSIS — Z8711 Personal history of peptic ulcer disease: Secondary | ICD-10-CM | POA: Diagnosis not present

## 2022-08-19 DIAGNOSIS — S72462D Displaced supracondylar fracture with intracondylar extension of lower end of left femur, subsequent encounter for closed fracture with routine healing: Secondary | ICD-10-CM | POA: Diagnosis not present

## 2022-08-19 DIAGNOSIS — Z9181 History of falling: Secondary | ICD-10-CM | POA: Diagnosis not present

## 2022-08-19 DIAGNOSIS — L89610 Pressure ulcer of right heel, unstageable: Secondary | ICD-10-CM | POA: Diagnosis not present

## 2022-08-19 DIAGNOSIS — E1140 Type 2 diabetes mellitus with diabetic neuropathy, unspecified: Secondary | ICD-10-CM | POA: Diagnosis not present

## 2022-08-19 DIAGNOSIS — E1122 Type 2 diabetes mellitus with diabetic chronic kidney disease: Secondary | ICD-10-CM | POA: Diagnosis not present

## 2022-08-19 DIAGNOSIS — H9190 Unspecified hearing loss, unspecified ear: Secondary | ICD-10-CM | POA: Diagnosis not present

## 2022-08-19 DIAGNOSIS — Z7982 Long term (current) use of aspirin: Secondary | ICD-10-CM | POA: Diagnosis not present

## 2022-08-19 DIAGNOSIS — H409 Unspecified glaucoma: Secondary | ICD-10-CM | POA: Diagnosis not present

## 2022-08-19 DIAGNOSIS — N189 Chronic kidney disease, unspecified: Secondary | ICD-10-CM | POA: Diagnosis not present

## 2022-08-19 DIAGNOSIS — E785 Hyperlipidemia, unspecified: Secondary | ICD-10-CM | POA: Diagnosis not present

## 2022-08-19 DIAGNOSIS — S72401D Unspecified fracture of lower end of right femur, subsequent encounter for closed fracture with routine healing: Secondary | ICD-10-CM | POA: Diagnosis not present

## 2022-08-19 DIAGNOSIS — J439 Emphysema, unspecified: Secondary | ICD-10-CM | POA: Diagnosis not present

## 2022-08-19 DIAGNOSIS — I129 Hypertensive chronic kidney disease with stage 1 through stage 4 chronic kidney disease, or unspecified chronic kidney disease: Secondary | ICD-10-CM | POA: Diagnosis not present

## 2022-08-19 DIAGNOSIS — I2510 Atherosclerotic heart disease of native coronary artery without angina pectoris: Secondary | ICD-10-CM | POA: Diagnosis not present

## 2022-08-19 DIAGNOSIS — Z951 Presence of aortocoronary bypass graft: Secondary | ICD-10-CM | POA: Diagnosis not present

## 2022-08-19 DIAGNOSIS — D630 Anemia in neoplastic disease: Secondary | ICD-10-CM | POA: Diagnosis not present

## 2022-08-19 DIAGNOSIS — D631 Anemia in chronic kidney disease: Secondary | ICD-10-CM | POA: Diagnosis not present

## 2022-08-19 DIAGNOSIS — M21371 Foot drop, right foot: Secondary | ICD-10-CM | POA: Diagnosis not present

## 2022-08-19 DIAGNOSIS — E559 Vitamin D deficiency, unspecified: Secondary | ICD-10-CM | POA: Diagnosis not present

## 2022-08-19 LAB — COMPREHENSIVE METABOLIC PANEL
ALT: 10 IU/L (ref 0–32)
AST: 15 IU/L (ref 0–40)
Albumin/Globulin Ratio: 1.1 — ABNORMAL LOW (ref 1.2–2.2)
Albumin: 3.5 g/dL — ABNORMAL LOW (ref 3.6–4.6)
Alkaline Phosphatase: 199 IU/L — ABNORMAL HIGH (ref 44–121)
BUN/Creatinine Ratio: 20 (ref 12–28)
BUN: 19 mg/dL (ref 10–36)
Bilirubin Total: 0.6 mg/dL (ref 0.0–1.2)
CO2: 21 mmol/L (ref 20–29)
Calcium: 8.2 mg/dL — ABNORMAL LOW (ref 8.7–10.3)
Chloride: 111 mmol/L — ABNORMAL HIGH (ref 96–106)
Creatinine, Ser: 0.95 mg/dL (ref 0.57–1.00)
Globulin, Total: 3.2 g/dL (ref 1.5–4.5)
Glucose: 99 mg/dL (ref 70–99)
Potassium: 3 mmol/L — ABNORMAL LOW (ref 3.5–5.2)
Sodium: 145 mmol/L — ABNORMAL HIGH (ref 134–144)
Total Protein: 6.7 g/dL (ref 6.0–8.5)
eGFR: 56 mL/min/{1.73_m2} — ABNORMAL LOW (ref >59)

## 2022-08-19 LAB — MAGNESIUM: Magnesium: 1.6 mg/dL (ref 1.6–2.3)

## 2022-08-21 NOTE — Assessment & Plan Note (Signed)
BP elevated in office  Recommended no med changes today, suspect swelling is related to increase in BP  She will continue metoprolol as prescribed

## 2022-08-21 NOTE — Assessment & Plan Note (Signed)
CMP and magnesium collected today  Suspect that this is likely related to kidney function changes  Encouraged patient and family to keep nephrology appt for 09/15/22  Discussed sparingly using '20mg'$  lasix for swelling PRN

## 2022-08-22 ENCOUNTER — Other Ambulatory Visit: Payer: Self-pay | Admitting: Family Medicine

## 2022-08-22 DIAGNOSIS — S72331S Displaced oblique fracture of shaft of right femur, sequela: Secondary | ICD-10-CM

## 2022-08-22 DIAGNOSIS — E785 Hyperlipidemia, unspecified: Secondary | ICD-10-CM | POA: Diagnosis not present

## 2022-08-22 DIAGNOSIS — S72002S Fracture of unspecified part of neck of left femur, sequela: Secondary | ICD-10-CM

## 2022-08-22 DIAGNOSIS — S72462D Displaced supracondylar fracture with intracondylar extension of lower end of left femur, subsequent encounter for closed fracture with routine healing: Secondary | ICD-10-CM | POA: Diagnosis not present

## 2022-08-22 DIAGNOSIS — E559 Vitamin D deficiency, unspecified: Secondary | ICD-10-CM | POA: Diagnosis not present

## 2022-08-22 DIAGNOSIS — I2510 Atherosclerotic heart disease of native coronary artery without angina pectoris: Secondary | ICD-10-CM | POA: Diagnosis not present

## 2022-08-22 DIAGNOSIS — D630 Anemia in neoplastic disease: Secondary | ICD-10-CM | POA: Diagnosis not present

## 2022-08-22 DIAGNOSIS — Z951 Presence of aortocoronary bypass graft: Secondary | ICD-10-CM | POA: Diagnosis not present

## 2022-08-22 DIAGNOSIS — I129 Hypertensive chronic kidney disease with stage 1 through stage 4 chronic kidney disease, or unspecified chronic kidney disease: Secondary | ICD-10-CM | POA: Diagnosis not present

## 2022-08-22 DIAGNOSIS — S31000D Unspecified open wound of lower back and pelvis without penetration into retroperitoneum, subsequent encounter: Secondary | ICD-10-CM

## 2022-08-22 DIAGNOSIS — L89610 Pressure ulcer of right heel, unstageable: Secondary | ICD-10-CM | POA: Diagnosis not present

## 2022-08-22 DIAGNOSIS — J439 Emphysema, unspecified: Secondary | ICD-10-CM | POA: Diagnosis not present

## 2022-08-22 DIAGNOSIS — S72401D Unspecified fracture of lower end of right femur, subsequent encounter for closed fracture with routine healing: Secondary | ICD-10-CM | POA: Diagnosis not present

## 2022-08-22 DIAGNOSIS — D631 Anemia in chronic kidney disease: Secondary | ICD-10-CM | POA: Diagnosis not present

## 2022-08-22 DIAGNOSIS — E1140 Type 2 diabetes mellitus with diabetic neuropathy, unspecified: Secondary | ICD-10-CM | POA: Diagnosis not present

## 2022-08-22 DIAGNOSIS — H9190 Unspecified hearing loss, unspecified ear: Secondary | ICD-10-CM | POA: Diagnosis not present

## 2022-08-22 DIAGNOSIS — H409 Unspecified glaucoma: Secondary | ICD-10-CM | POA: Diagnosis not present

## 2022-08-22 DIAGNOSIS — Z7982 Long term (current) use of aspirin: Secondary | ICD-10-CM | POA: Diagnosis not present

## 2022-08-22 DIAGNOSIS — E1122 Type 2 diabetes mellitus with diabetic chronic kidney disease: Secondary | ICD-10-CM | POA: Diagnosis not present

## 2022-08-22 DIAGNOSIS — Z9181 History of falling: Secondary | ICD-10-CM | POA: Diagnosis not present

## 2022-08-22 DIAGNOSIS — Z8711 Personal history of peptic ulcer disease: Secondary | ICD-10-CM | POA: Diagnosis not present

## 2022-08-22 DIAGNOSIS — D696 Thrombocytopenia, unspecified: Secondary | ICD-10-CM | POA: Diagnosis not present

## 2022-08-22 DIAGNOSIS — D472 Monoclonal gammopathy: Secondary | ICD-10-CM | POA: Diagnosis not present

## 2022-08-22 DIAGNOSIS — N189 Chronic kidney disease, unspecified: Secondary | ICD-10-CM | POA: Diagnosis not present

## 2022-08-22 DIAGNOSIS — M21371 Foot drop, right foot: Secondary | ICD-10-CM | POA: Diagnosis not present

## 2022-08-22 MED ORDER — POTASSIUM CHLORIDE CRYS ER 10 MEQ PO TBCR: 20.0000 meq | EXTENDED_RELEASE_TABLET | Freq: Every day | ORAL | 1 refills | Status: DC

## 2022-08-24 ENCOUNTER — Encounter: Payer: Self-pay | Admitting: Family Medicine

## 2022-08-26 ENCOUNTER — Telehealth: Payer: Self-pay

## 2022-08-26 DIAGNOSIS — E1159 Type 2 diabetes mellitus with other circulatory complications: Secondary | ICD-10-CM | POA: Diagnosis not present

## 2022-08-26 DIAGNOSIS — Z7982 Long term (current) use of aspirin: Secondary | ICD-10-CM | POA: Diagnosis not present

## 2022-08-26 DIAGNOSIS — D649 Anemia, unspecified: Secondary | ICD-10-CM | POA: Diagnosis not present

## 2022-08-26 DIAGNOSIS — S72331D Displaced oblique fracture of shaft of right femur, subsequent encounter for closed fracture with routine healing: Secondary | ICD-10-CM | POA: Diagnosis not present

## 2022-08-26 DIAGNOSIS — S72002D Fracture of unspecified part of neck of left femur, subsequent encounter for closed fracture with routine healing: Secondary | ICD-10-CM | POA: Diagnosis not present

## 2022-08-26 DIAGNOSIS — R600 Localized edema: Secondary | ICD-10-CM | POA: Diagnosis not present

## 2022-08-26 DIAGNOSIS — Z993 Dependence on wheelchair: Secondary | ICD-10-CM | POA: Diagnosis not present

## 2022-08-26 DIAGNOSIS — Z9181 History of falling: Secondary | ICD-10-CM | POA: Diagnosis not present

## 2022-08-26 DIAGNOSIS — I10 Essential (primary) hypertension: Secondary | ICD-10-CM | POA: Diagnosis not present

## 2022-08-26 NOTE — Telephone Encounter (Signed)
Copied from [place] [id number]. Topic: Quick Communication - Home Health Verbal Orders >> Aug 26, 2022  3:03 PM Marcellus Scott wrote: Caller/Agency: [facility] Number: [phone] Requesting OT/PT/Skilled Nursing/Social Work/Speech Therapy: PT Frequency:1w7 Occupational therapy evaluation

## 2022-08-26 NOTE — Telephone Encounter (Signed)
Ok for verbal orders.    Yoon Barca Simmons-Robinson, MD  [facility]  

## 2022-08-29 NOTE — Telephone Encounter (Signed)
Verbal orders given to [facility] with [facility]

## 2022-08-30 DIAGNOSIS — R600 Localized edema: Secondary | ICD-10-CM | POA: Diagnosis not present

## 2022-08-30 DIAGNOSIS — E1159 Type 2 diabetes mellitus with other circulatory complications: Secondary | ICD-10-CM | POA: Diagnosis not present

## 2022-08-30 DIAGNOSIS — S72331D Displaced oblique fracture of shaft of right femur, subsequent encounter for closed fracture with routine healing: Secondary | ICD-10-CM | POA: Diagnosis not present

## 2022-08-30 DIAGNOSIS — Z993 Dependence on wheelchair: Secondary | ICD-10-CM | POA: Diagnosis not present

## 2022-08-30 DIAGNOSIS — I10 Essential (primary) hypertension: Secondary | ICD-10-CM | POA: Diagnosis not present

## 2022-08-30 DIAGNOSIS — S72002D Fracture of unspecified part of neck of left femur, subsequent encounter for closed fracture with routine healing: Secondary | ICD-10-CM | POA: Diagnosis not present

## 2022-08-30 DIAGNOSIS — Z9181 History of falling: Secondary | ICD-10-CM | POA: Diagnosis not present

## 2022-08-30 DIAGNOSIS — Z7982 Long term (current) use of aspirin: Secondary | ICD-10-CM | POA: Diagnosis not present

## 2022-08-30 DIAGNOSIS — D649 Anemia, unspecified: Secondary | ICD-10-CM | POA: Diagnosis not present

## 2022-08-31 DIAGNOSIS — S72451D Displaced supracondylar fracture without intracondylar extension of lower end of right femur, subsequent encounter for closed fracture with routine healing: Secondary | ICD-10-CM | POA: Diagnosis not present

## 2022-08-31 DIAGNOSIS — R2243 Localized swelling, mass and lump, lower limb, bilateral: Secondary | ICD-10-CM | POA: Diagnosis not present

## 2022-08-31 DIAGNOSIS — M9701XD Periprosthetic fracture around internal prosthetic right hip joint, subsequent encounter: Secondary | ICD-10-CM | POA: Diagnosis not present

## 2022-08-31 DIAGNOSIS — S72462D Displaced supracondylar fracture with intracondylar extension of lower end of left femur, subsequent encounter for closed fracture with routine healing: Secondary | ICD-10-CM | POA: Diagnosis not present

## 2022-09-01 ENCOUNTER — Encounter: Payer: Self-pay | Admitting: Family Medicine

## 2022-09-01 NOTE — Telephone Encounter (Signed)
Copied from [place] [id number]. Topic: General - Other >> Sep 01, 2022  2:00 PM Ja-Kwan M wrote: Reason for CRM: Pt granddaughter reports that they were told that on page 3 of the CAP[employer] paperwork the bold statement was omitted and the application is on hold until the completed paperwork is re-submitted. Pt granddaughter requests that the omitted portion of the paperwork be updated and faxed back to [phone]

## 2022-09-02 ENCOUNTER — Telehealth: Payer: Self-pay | Admitting: Family Medicine

## 2022-09-02 DIAGNOSIS — S72331D Displaced oblique fracture of shaft of right femur, subsequent encounter for closed fracture with routine healing: Secondary | ICD-10-CM | POA: Diagnosis not present

## 2022-09-02 DIAGNOSIS — S72002D Fracture of unspecified part of neck of left femur, subsequent encounter for closed fracture with routine healing: Secondary | ICD-10-CM | POA: Diagnosis not present

## 2022-09-02 DIAGNOSIS — Z9181 History of falling: Secondary | ICD-10-CM | POA: Diagnosis not present

## 2022-09-02 DIAGNOSIS — I10 Essential (primary) hypertension: Secondary | ICD-10-CM | POA: Diagnosis not present

## 2022-09-02 DIAGNOSIS — D649 Anemia, unspecified: Secondary | ICD-10-CM | POA: Diagnosis not present

## 2022-09-02 DIAGNOSIS — Z993 Dependence on wheelchair: Secondary | ICD-10-CM | POA: Diagnosis not present

## 2022-09-02 DIAGNOSIS — E1159 Type 2 diabetes mellitus with other circulatory complications: Secondary | ICD-10-CM | POA: Diagnosis not present

## 2022-09-02 DIAGNOSIS — Z7982 Long term (current) use of aspirin: Secondary | ICD-10-CM | POA: Diagnosis not present

## 2022-09-02 DIAGNOSIS — R600 Localized edema: Secondary | ICD-10-CM | POA: Diagnosis not present

## 2022-09-02 NOTE — Telephone Encounter (Signed)
Pts Granddaughter [name] is calling to follow up on the CAPS form question was omitted " In your clinical judgment are the health care conditions (diagnosis, medication,specialized treatment, and Medical regiment) chronic and severe enough to meet an institutional level of care ?   Can this quest please be completed an returned back to CAPS?  CB- [phone]

## 2022-09-03 ENCOUNTER — Other Ambulatory Visit: Payer: Self-pay | Admitting: Family Medicine

## 2022-09-05 ENCOUNTER — Telehealth: Payer: Self-pay | Admitting: Family Medicine

## 2022-09-05 NOTE — Telephone Encounter (Signed)
Verbal orders given

## 2022-09-05 NOTE — Telephone Encounter (Signed)
Ok for verbal orders.    Gari Trovato Simmons-Robinson, MD  [facility]  

## 2022-09-05 NOTE — Telephone Encounter (Signed)
Copied from [place] [id number]. Topic: Quick Communication - Home Health Verbal Orders >> Sep 05, 2022 10:02 AM Cyndi Bender wrote: Caller/Agency: Horris Latino with [facility]  Callback Number: [phone] Requesting OT/PT/Skilled Nursing/Social Work/Speech Therapy: Home Health aid  Frequency: evaluation for bathing and dressing

## 2022-09-06 DIAGNOSIS — Z993 Dependence on wheelchair: Secondary | ICD-10-CM | POA: Diagnosis not present

## 2022-09-06 DIAGNOSIS — I10 Essential (primary) hypertension: Secondary | ICD-10-CM | POA: Diagnosis not present

## 2022-09-06 DIAGNOSIS — Z7982 Long term (current) use of aspirin: Secondary | ICD-10-CM | POA: Diagnosis not present

## 2022-09-06 DIAGNOSIS — S72002D Fracture of unspecified part of neck of left femur, subsequent encounter for closed fracture with routine healing: Secondary | ICD-10-CM | POA: Diagnosis not present

## 2022-09-06 DIAGNOSIS — D649 Anemia, unspecified: Secondary | ICD-10-CM | POA: Diagnosis not present

## 2022-09-06 DIAGNOSIS — E1159 Type 2 diabetes mellitus with other circulatory complications: Secondary | ICD-10-CM | POA: Diagnosis not present

## 2022-09-06 DIAGNOSIS — S72331D Displaced oblique fracture of shaft of right femur, subsequent encounter for closed fracture with routine healing: Secondary | ICD-10-CM | POA: Diagnosis not present

## 2022-09-06 DIAGNOSIS — Z9181 History of falling: Secondary | ICD-10-CM | POA: Diagnosis not present

## 2022-09-06 DIAGNOSIS — R600 Localized edema: Secondary | ICD-10-CM | POA: Diagnosis not present

## 2022-09-09 ENCOUNTER — Other Ambulatory Visit: Payer: Self-pay | Admitting: Family Medicine

## 2022-09-09 DIAGNOSIS — Z7982 Long term (current) use of aspirin: Secondary | ICD-10-CM | POA: Diagnosis not present

## 2022-09-09 DIAGNOSIS — M7989 Other specified soft tissue disorders: Secondary | ICD-10-CM

## 2022-09-09 DIAGNOSIS — D649 Anemia, unspecified: Secondary | ICD-10-CM | POA: Diagnosis not present

## 2022-09-09 DIAGNOSIS — S72331D Displaced oblique fracture of shaft of right femur, subsequent encounter for closed fracture with routine healing: Secondary | ICD-10-CM | POA: Diagnosis not present

## 2022-09-09 DIAGNOSIS — Z993 Dependence on wheelchair: Secondary | ICD-10-CM | POA: Diagnosis not present

## 2022-09-09 DIAGNOSIS — S72002D Fracture of unspecified part of neck of left femur, subsequent encounter for closed fracture with routine healing: Secondary | ICD-10-CM | POA: Diagnosis not present

## 2022-09-09 DIAGNOSIS — E1159 Type 2 diabetes mellitus with other circulatory complications: Secondary | ICD-10-CM | POA: Diagnosis not present

## 2022-09-09 DIAGNOSIS — I10 Essential (primary) hypertension: Secondary | ICD-10-CM | POA: Diagnosis not present

## 2022-09-09 DIAGNOSIS — N184 Chronic kidney disease, stage 4 (severe): Secondary | ICD-10-CM

## 2022-09-09 DIAGNOSIS — R600 Localized edema: Secondary | ICD-10-CM | POA: Diagnosis not present

## 2022-09-09 DIAGNOSIS — Z9181 History of falling: Secondary | ICD-10-CM | POA: Diagnosis not present

## 2022-09-12 ENCOUNTER — Encounter: Payer: Self-pay | Admitting: Family Medicine

## 2022-09-12 ENCOUNTER — Ambulatory Visit (INDEPENDENT_AMBULATORY_CARE_PROVIDER_SITE_OTHER): Payer: 59 | Admitting: Family Medicine

## 2022-09-12 VITALS — BP 152/86 | HR 83 | Temp 97.5°F

## 2022-09-12 DIAGNOSIS — M25551 Pain in right hip: Secondary | ICD-10-CM | POA: Diagnosis not present

## 2022-09-12 DIAGNOSIS — J441 Chronic obstructive pulmonary disease with (acute) exacerbation: Secondary | ICD-10-CM | POA: Diagnosis not present

## 2022-09-12 MED ORDER — IPRATROPIUM-ALBUTEROL 0.5-2.5 (3) MG/3ML IN SOLN: 3.0000 mL | RESPIRATORY_TRACT | 4 refills | Status: DC | PRN

## 2022-09-12 MED ORDER — TRAMADOL HCL 50 MG PO TABS: 50.0000 mg | ORAL_TABLET | Freq: Three times a day (TID) | ORAL | 0 refills | Status: DC | PRN

## 2022-09-12 MED ORDER — IPRATROPIUM BROMIDE HFA 17 MCG/ACT IN AERS: 2.0000 | INHALATION_SPRAY | RESPIRATORY_TRACT | 12 refills | Status: DC | PRN

## 2022-09-12 MED ORDER — ALBUTEROL SULFATE HFA 108 (90 BASE) MCG/ACT IN AERS: 2.0000 | INHALATION_SPRAY | RESPIRATORY_TRACT | 6 refills | Status: DC | PRN

## 2022-09-12 MED ORDER — AZITHROMYCIN 500 MG PO TABS: ORAL_TABLET | ORAL | 0 refills | Status: AC

## 2022-09-12 NOTE — Progress Notes (Signed)
I,[name],acting as a Education administrator for [employer], MD.,have documented all relevant documentation on the behalf of Gabriela Foster, MD,as directed by  Gabriela Foster, MD while in the presence of Gabriela Foster, MD.   Established patient visit   [name]   DOB: Dec 13, 1925   89 y.o. Female  MRN: [id number] Visit Date: 09/12/2022  Today's healthcare provider: [name], MD   Chief Complaint  Patient presents with   Cough   Subjective    Cough Associated symptoms include wheezing.   Symptoms started 5 days ago, wheezing progressively worsening. Pt also complains of hip pain x 2 week  Patient has been wheezing for 2 weeks and having to use the inhaler more frequently  She has used the inhaler completely  The coughing is worse during the night Patient is sob with the increased wheezing    Patient reports that she is having hip pain related to chronic hip pain   Medications: Outpatient Medications Prior to Visit  Medication Sig   acetaminophen (TYLENOL) 325 MG tablet Take 2 tablets (650 mg total) by mouth 3 (three) times daily. Wean as able   aspirin EC 81 MG tablet Take 1 tablet (81 mg total) by mouth daily.   calcium carbonate (TUMS - DOSED IN MG ELEMENTAL CALCIUM) 500 MG chewable tablet Chew 2 tablets (400 mg of elemental calcium total) by mouth daily.   diclofenac Sodium (VOLTAREN) 1 % GEL Apply 2 g topically 4 (four) times daily.   ferrous sulfate 325 (65 FE) MG tablet Take 1 tablet (325 mg total) by mouth daily with breakfast.   furosemide (LASIX) 20 MG tablet Take 1 tablet (20 mg total) by mouth as needed. Leg Swelling M79.89   loratadine (CLARITIN) 10 MG tablet TAKE 1 TABLET BY MOUTH EVERY DAY   methocarbamol (ROBAXIN) 500 MG tablet Take 1 tablet (500 mg total) by mouth every 6 (six) hours as needed for muscle spasms.   metoprolol succinate (TOPROL-XL) 25 MG 24 hr tablet Take 0.5 tablets  (12.5 mg total) by mouth daily.   Multiple Vitamin (MULTIVITAMIN WITH MINERALS) TABS tablet Take 1 tablet by mouth daily.   polyethylene glycol powder (GLYCOLAX/MIRALAX) 17 GM/SCOOP powder Take 17 g by mouth daily.   potassium chloride (KLOR-CON M) 10 MEQ tablet Take 2 tablets (20 mEq total) by mouth daily.   SIMBRINZA 1-0.2 % SUSP Place 1 drop into both eyes 2 (two) times daily.   sodium bicarbonate 650 MG tablet Take 1 tablet (650 mg total) by mouth 2 (two) times daily.   timolol (TIMOPTIC) 0.5 % ophthalmic solution Place 1 drop into both eyes 2 (two) times daily.   Vitamin D, Ergocalciferol, (DRISDOL) 1.25 MG (50000 UNIT) CAPS capsule Take 1 capsule (50,000 Units total) by mouth every 7 (seven) days.   [DISCONTINUED] albuterol (VENTOLIN HFA) 108 (90 Base) MCG/ACT inhaler Inhale 2 puffs into the lungs every 6 (six) hours as needed for wheezing or shortness of breath.   [DISCONTINUED] oxyCODONE (OXY IR/ROXICODONE) 5 MG immediate release tablet Take 1 tablet (5 mg total) by mouth every 4 (four) hours as needed for moderate pain.   No facility-administered medications prior to visit.    Review of Systems  Respiratory:  Positive for cough and wheezing.        Objective    BP (!) 152/86 (BP Location: Right Arm, Patient Position: Sitting, Cuff Size: Normal)   Pulse 83   Temp (!) 97.5 F (36.4 C) (Oral)   SpO2  99%    Physical Exam Constitutional:      General: She is not in acute distress.    Appearance: Normal appearance. She is ill-appearing. She is not toxic-appearing or diaphoretic.     Interventions: She is not intubated. Pulmonary:     Effort: Accessory muscle usage and retractions present. No respiratory distress. She is not intubated.     Breath sounds: No stridor. Examination of the right-upper field reveals wheezing. Examination of the left-upper field reveals wheezing. Examination of the right-middle field reveals decreased breath sounds and wheezing. Examination of the  left-middle field reveals decreased breath sounds and wheezing. Examination of the right-lower field reveals decreased breath sounds and wheezing. Examination of the left-lower field reveals decreased breath sounds and wheezing. Decreased breath sounds and wheezing present. No rhonchi or rales.  Neurological:     Mental Status: She is alert.       No results found for any visits on 09/12/22.  Assessment & Plan     Problem List Items Addressed This Visit       Respiratory   COPD exacerbation ([place]) - Primary    Patient without formal diagnosis but has extensive hx of tobacco smoke exposure  Will treat like copd exacerbation with azithromycin for 5 days  Family requests to avoid PO steroids due to recent PNA after steroid course  Will presribe both ipratopium and albuterol inhalers as well as duoneb solution per granddaughter's request, patient to inhale these every 4 hours for next 2-3 days  Discussed ED precautions including worsening respiratory status despite treatments, family voiced understanding  Offered covid testing, family declines stating she has had limited visitors in the home  Family declines CXR due to difficulty with transportation to appt and challenges with additional destinations        Relevant Medications   azithromycin (ZITHROMAX) 500 MG tablet   albuterol (VENTOLIN HFA) 108 (90 Base) MCG/ACT inhaler   ipratropium (ATROVENT HFA) 17 MCG/ACT inhaler   ipratropium-albuterol (DUONEB) 0.5-2.5 (3) MG/3ML SOLN     Other   Right hip pain    Hip pain has started to bother patient again  Family would prefer agent different than oxycodone  Will trial tramadol '50mg'$  q 8 hours for pain          No follow-ups on file.        The entirety of the information documented in the History of Present Illness, Review of Systems and Physical Exam were personally obtained by me. Portions of this information were initially documented by Eduard Clos, [facility] reviewed by me  for thoroughness and accuracy.Gabriela Foster, MD     Gabriela Foster, MD  [facility] [phone] (phone) [phone] (fax)  [place]

## 2022-09-12 NOTE — Assessment & Plan Note (Signed)
Patient without formal diagnosis but has extensive hx of tobacco smoke exposure  Will treat like copd exacerbation with azithromycin for 5 days  Family requests to avoid PO steroids due to recent PNA after steroid course  Will presribe both ipratopium and albuterol inhalers as well as duoneb solution per granddaughter's request, patient to inhale these every 4 hours for next 2-3 days  Discussed ED precautions including worsening respiratory status despite treatments, family voiced understanding  Offered covid testing, family declines stating she has had limited visitors in the home  Family declines CXR due to difficulty with transportation to appt and challenges with additional destinations

## 2022-09-12 NOTE — Patient Instructions (Signed)
Please use the ipratropium inhaler 1 puff every 4 hours for the next three days.   You can use the albuterol 2 puffs every four hours for the next three days    I have prescribed azithromycin for you to take '500mg'$  (1 tablet) today and then 1/2 tablet for the next four days.    Please monitor for worsening shortness of breath or increased rate of breathing or if the pulse oximetry is less than 90% as these are signs that you will need to be evaluated in the emergency room.

## 2022-09-12 NOTE — Assessment & Plan Note (Signed)
Hip pain has started to bother patient again  Family would prefer agent different than oxycodone  Will trial tramadol '50mg'$  q 8 hours for pain

## 2022-09-13 DIAGNOSIS — D649 Anemia, unspecified: Secondary | ICD-10-CM | POA: Diagnosis not present

## 2022-09-13 DIAGNOSIS — Z993 Dependence on wheelchair: Secondary | ICD-10-CM | POA: Diagnosis not present

## 2022-09-13 DIAGNOSIS — Z7982 Long term (current) use of aspirin: Secondary | ICD-10-CM | POA: Diagnosis not present

## 2022-09-13 DIAGNOSIS — E1159 Type 2 diabetes mellitus with other circulatory complications: Secondary | ICD-10-CM | POA: Diagnosis not present

## 2022-09-13 DIAGNOSIS — S72002D Fracture of unspecified part of neck of left femur, subsequent encounter for closed fracture with routine healing: Secondary | ICD-10-CM | POA: Diagnosis not present

## 2022-09-13 DIAGNOSIS — Z9181 History of falling: Secondary | ICD-10-CM | POA: Diagnosis not present

## 2022-09-13 DIAGNOSIS — S72331D Displaced oblique fracture of shaft of right femur, subsequent encounter for closed fracture with routine healing: Secondary | ICD-10-CM | POA: Diagnosis not present

## 2022-09-13 DIAGNOSIS — I10 Essential (primary) hypertension: Secondary | ICD-10-CM | POA: Diagnosis not present

## 2022-09-13 DIAGNOSIS — R600 Localized edema: Secondary | ICD-10-CM | POA: Diagnosis not present

## 2022-09-13 NOTE — Progress Notes (Signed)
Prescription for Nebulizer machine ordered and fax to [facility]

## 2022-09-14 DIAGNOSIS — Z993 Dependence on wheelchair: Secondary | ICD-10-CM | POA: Diagnosis not present

## 2022-09-14 DIAGNOSIS — D649 Anemia, unspecified: Secondary | ICD-10-CM | POA: Diagnosis not present

## 2022-09-14 DIAGNOSIS — E1159 Type 2 diabetes mellitus with other circulatory complications: Secondary | ICD-10-CM | POA: Diagnosis not present

## 2022-09-14 DIAGNOSIS — S72002D Fracture of unspecified part of neck of left femur, subsequent encounter for closed fracture with routine healing: Secondary | ICD-10-CM | POA: Diagnosis not present

## 2022-09-14 DIAGNOSIS — I10 Essential (primary) hypertension: Secondary | ICD-10-CM | POA: Diagnosis not present

## 2022-09-14 DIAGNOSIS — Z7982 Long term (current) use of aspirin: Secondary | ICD-10-CM | POA: Diagnosis not present

## 2022-09-14 DIAGNOSIS — R600 Localized edema: Secondary | ICD-10-CM | POA: Diagnosis not present

## 2022-09-14 DIAGNOSIS — S72331D Displaced oblique fracture of shaft of right femur, subsequent encounter for closed fracture with routine healing: Secondary | ICD-10-CM | POA: Diagnosis not present

## 2022-09-14 DIAGNOSIS — Z9181 History of falling: Secondary | ICD-10-CM | POA: Diagnosis not present

## 2022-09-15 DIAGNOSIS — R600 Localized edema: Secondary | ICD-10-CM | POA: Diagnosis not present

## 2022-09-15 DIAGNOSIS — N1831 Chronic kidney disease, stage 3a: Secondary | ICD-10-CM | POA: Diagnosis not present

## 2022-09-15 DIAGNOSIS — E876 Hypokalemia: Secondary | ICD-10-CM | POA: Diagnosis not present

## 2022-09-19 DIAGNOSIS — I10 Essential (primary) hypertension: Secondary | ICD-10-CM | POA: Diagnosis not present

## 2022-09-19 DIAGNOSIS — S72002D Fracture of unspecified part of neck of left femur, subsequent encounter for closed fracture with routine healing: Secondary | ICD-10-CM | POA: Diagnosis not present

## 2022-09-19 DIAGNOSIS — R600 Localized edema: Secondary | ICD-10-CM | POA: Diagnosis not present

## 2022-09-19 DIAGNOSIS — Z993 Dependence on wheelchair: Secondary | ICD-10-CM | POA: Diagnosis not present

## 2022-09-19 DIAGNOSIS — Z7982 Long term (current) use of aspirin: Secondary | ICD-10-CM | POA: Diagnosis not present

## 2022-09-19 DIAGNOSIS — D649 Anemia, unspecified: Secondary | ICD-10-CM | POA: Diagnosis not present

## 2022-09-19 DIAGNOSIS — Z9181 History of falling: Secondary | ICD-10-CM | POA: Diagnosis not present

## 2022-09-19 DIAGNOSIS — S72331D Displaced oblique fracture of shaft of right femur, subsequent encounter for closed fracture with routine healing: Secondary | ICD-10-CM | POA: Diagnosis not present

## 2022-09-19 DIAGNOSIS — E1159 Type 2 diabetes mellitus with other circulatory complications: Secondary | ICD-10-CM | POA: Diagnosis not present

## 2022-09-20 DIAGNOSIS — S728X9A Other fracture of unspecified femur, initial encounter for closed fracture: Secondary | ICD-10-CM | POA: Diagnosis not present

## 2022-09-21 ENCOUNTER — Emergency Department: Payer: 59

## 2022-09-21 ENCOUNTER — Encounter: Payer: Self-pay | Admitting: Emergency Medicine

## 2022-09-21 ENCOUNTER — Inpatient Hospital Stay
Admission: EM | Admit: 2022-09-21 | Discharge: 2022-10-03 | DRG: 064 | Disposition: A | Discharge status: Skilled Nursing Facility | Payer: 59 | Attending: Internal Medicine | Admitting: Internal Medicine

## 2022-09-21 ENCOUNTER — Inpatient Hospital Stay: Payer: 59

## 2022-09-21 ENCOUNTER — Other Ambulatory Visit: Payer: Self-pay

## 2022-09-21 DIAGNOSIS — S728X9A Other fracture of unspecified femur, initial encounter for closed fracture: Secondary | ICD-10-CM | POA: Diagnosis not present

## 2022-09-21 DIAGNOSIS — D472 Monoclonal gammopathy: Secondary | ICD-10-CM | POA: Diagnosis present

## 2022-09-21 DIAGNOSIS — I2510 Atherosclerotic heart disease of native coronary artery without angina pectoris: Secondary | ICD-10-CM | POA: Diagnosis not present

## 2022-09-21 DIAGNOSIS — I69322 Dysarthria following cerebral infarction: Secondary | ICD-10-CM | POA: Diagnosis not present

## 2022-09-21 DIAGNOSIS — Z9049 Acquired absence of other specified parts of digestive tract: Secondary | ICD-10-CM

## 2022-09-21 DIAGNOSIS — J90 Pleural effusion, not elsewhere classified: Secondary | ICD-10-CM

## 2022-09-21 DIAGNOSIS — Z66 Do not resuscitate: Secondary | ICD-10-CM | POA: Diagnosis present

## 2022-09-21 DIAGNOSIS — R7989 Other specified abnormal findings of blood chemistry: Secondary | ICD-10-CM

## 2022-09-21 DIAGNOSIS — U071 COVID-19: Secondary | ICD-10-CM | POA: Diagnosis not present

## 2022-09-21 DIAGNOSIS — Z7982 Long term (current) use of aspirin: Secondary | ICD-10-CM

## 2022-09-21 DIAGNOSIS — E559 Vitamin D deficiency, unspecified: Secondary | ICD-10-CM | POA: Diagnosis not present

## 2022-09-21 DIAGNOSIS — D638 Anemia in other chronic diseases classified elsewhere: Secondary | ICD-10-CM | POA: Diagnosis present

## 2022-09-21 DIAGNOSIS — F0390 Unspecified dementia without behavioral disturbance: Secondary | ICD-10-CM | POA: Diagnosis present

## 2022-09-21 DIAGNOSIS — E785 Hyperlipidemia, unspecified: Secondary | ICD-10-CM | POA: Diagnosis present

## 2022-09-21 DIAGNOSIS — Z7401 Bed confinement status: Secondary | ICD-10-CM

## 2022-09-21 DIAGNOSIS — Z951 Presence of aortocoronary bypass graft: Secondary | ICD-10-CM | POA: Insufficient documentation

## 2022-09-21 DIAGNOSIS — N1832 Chronic kidney disease, stage 3b: Secondary | ICD-10-CM | POA: Diagnosis present

## 2022-09-21 DIAGNOSIS — J811 Chronic pulmonary edema: Secondary | ICD-10-CM | POA: Diagnosis not present

## 2022-09-21 DIAGNOSIS — Z8249 Family history of ischemic heart disease and other diseases of the circulatory system: Secondary | ICD-10-CM

## 2022-09-21 DIAGNOSIS — E1122 Type 2 diabetes mellitus with diabetic chronic kidney disease: Secondary | ICD-10-CM | POA: Diagnosis present

## 2022-09-21 DIAGNOSIS — J9601 Acute respiratory failure with hypoxia: Secondary | ICD-10-CM | POA: Diagnosis not present

## 2022-09-21 DIAGNOSIS — I69391 Dysphagia following cerebral infarction: Secondary | ICD-10-CM | POA: Diagnosis not present

## 2022-09-21 DIAGNOSIS — I639 Cerebral infarction, unspecified: Secondary | ICD-10-CM | POA: Diagnosis not present

## 2022-09-21 DIAGNOSIS — D631 Anemia in chronic kidney disease: Secondary | ICD-10-CM | POA: Diagnosis present

## 2022-09-21 DIAGNOSIS — H409 Unspecified glaucoma: Secondary | ICD-10-CM | POA: Diagnosis present

## 2022-09-21 DIAGNOSIS — R4781 Slurred speech: Secondary | ICD-10-CM | POA: Diagnosis not present

## 2022-09-21 DIAGNOSIS — Z79899 Other long term (current) drug therapy: Secondary | ICD-10-CM

## 2022-09-21 DIAGNOSIS — N179 Acute kidney failure, unspecified: Secondary | ICD-10-CM | POA: Diagnosis present

## 2022-09-21 DIAGNOSIS — R600 Localized edema: Secondary | ICD-10-CM | POA: Diagnosis not present

## 2022-09-21 DIAGNOSIS — I10 Essential (primary) hypertension: Secondary | ICD-10-CM | POA: Diagnosis present

## 2022-09-21 DIAGNOSIS — R29708 NIHSS score 8: Secondary | ICD-10-CM | POA: Diagnosis not present

## 2022-09-21 DIAGNOSIS — M6281 Muscle weakness (generalized): Secondary | ICD-10-CM | POA: Diagnosis not present

## 2022-09-21 DIAGNOSIS — I69392 Facial weakness following cerebral infarction: Secondary | ICD-10-CM | POA: Diagnosis not present

## 2022-09-21 DIAGNOSIS — R29810 Facial weakness: Secondary | ICD-10-CM | POA: Diagnosis present

## 2022-09-21 DIAGNOSIS — J9690 Respiratory failure, unspecified, unspecified whether with hypoxia or hypercapnia: Secondary | ICD-10-CM | POA: Diagnosis not present

## 2022-09-21 DIAGNOSIS — G319 Degenerative disease of nervous system, unspecified: Secondary | ICD-10-CM | POA: Diagnosis not present

## 2022-09-21 DIAGNOSIS — I5022 Chronic systolic (congestive) heart failure: Secondary | ICD-10-CM

## 2022-09-21 DIAGNOSIS — I69398 Other sequelae of cerebral infarction: Secondary | ICD-10-CM | POA: Diagnosis not present

## 2022-09-21 DIAGNOSIS — Z8616 Personal history of COVID-19: Secondary | ICD-10-CM | POA: Diagnosis not present

## 2022-09-21 DIAGNOSIS — M21371 Foot drop, right foot: Secondary | ICD-10-CM | POA: Diagnosis not present

## 2022-09-21 DIAGNOSIS — I959 Hypotension, unspecified: Secondary | ICD-10-CM | POA: Diagnosis not present

## 2022-09-21 DIAGNOSIS — E1151 Type 2 diabetes mellitus with diabetic peripheral angiopathy without gangrene: Secondary | ICD-10-CM | POA: Diagnosis not present

## 2022-09-21 DIAGNOSIS — D696 Thrombocytopenia, unspecified: Secondary | ICD-10-CM | POA: Diagnosis present

## 2022-09-21 DIAGNOSIS — R0902 Hypoxemia: Secondary | ICD-10-CM | POA: Diagnosis not present

## 2022-09-21 DIAGNOSIS — T148XXA Other injury of unspecified body region, initial encounter: Secondary | ICD-10-CM | POA: Diagnosis not present

## 2022-09-21 DIAGNOSIS — I63541 Cerebral infarction due to unspecified occlusion or stenosis of right cerebellar artery: Secondary | ICD-10-CM | POA: Diagnosis not present

## 2022-09-21 DIAGNOSIS — R059 Cough, unspecified: Secondary | ICD-10-CM | POA: Diagnosis not present

## 2022-09-21 DIAGNOSIS — E11649 Type 2 diabetes mellitus with hypoglycemia without coma: Secondary | ICD-10-CM | POA: Diagnosis not present

## 2022-09-21 DIAGNOSIS — J208 Acute bronchitis due to other specified organisms: Secondary | ICD-10-CM | POA: Diagnosis present

## 2022-09-21 DIAGNOSIS — J209 Acute bronchitis, unspecified: Secondary | ICD-10-CM | POA: Diagnosis not present

## 2022-09-21 DIAGNOSIS — I6523 Occlusion and stenosis of bilateral carotid arteries: Secondary | ICD-10-CM | POA: Diagnosis not present

## 2022-09-21 DIAGNOSIS — E870 Hyperosmolality and hypernatremia: Secondary | ICD-10-CM | POA: Insufficient documentation

## 2022-09-21 DIAGNOSIS — I5A Non-ischemic myocardial injury (non-traumatic): Secondary | ICD-10-CM | POA: Diagnosis not present

## 2022-09-21 DIAGNOSIS — M1990 Unspecified osteoarthritis, unspecified site: Secondary | ICD-10-CM | POA: Diagnosis not present

## 2022-09-21 DIAGNOSIS — Z882 Allergy status to sulfonamides status: Secondary | ICD-10-CM

## 2022-09-21 DIAGNOSIS — I63233 Cerebral infarction due to unspecified occlusion or stenosis of bilateral carotid arteries: Secondary | ICD-10-CM | POA: Diagnosis not present

## 2022-09-21 DIAGNOSIS — J440 Chronic obstructive pulmonary disease with acute lower respiratory infection: Secondary | ICD-10-CM | POA: Diagnosis not present

## 2022-09-21 DIAGNOSIS — M7989 Other specified soft tissue disorders: Secondary | ICD-10-CM

## 2022-09-21 DIAGNOSIS — J9811 Atelectasis: Secondary | ICD-10-CM | POA: Diagnosis not present

## 2022-09-21 DIAGNOSIS — G9341 Metabolic encephalopathy: Secondary | ICD-10-CM | POA: Diagnosis present

## 2022-09-21 DIAGNOSIS — I5021 Acute systolic (congestive) heart failure: Secondary | ICD-10-CM | POA: Diagnosis not present

## 2022-09-21 DIAGNOSIS — I2699 Other pulmonary embolism without acute cor pulmonale: Secondary | ICD-10-CM | POA: Diagnosis not present

## 2022-09-21 DIAGNOSIS — I130 Hypertensive heart and chronic kidney disease with heart failure and stage 1 through stage 4 chronic kidney disease, or unspecified chronic kidney disease: Secondary | ICD-10-CM | POA: Diagnosis present

## 2022-09-21 DIAGNOSIS — Z743 Need for continuous supervision: Secondary | ICD-10-CM | POA: Diagnosis not present

## 2022-09-21 DIAGNOSIS — R0602 Shortness of breath: Secondary | ICD-10-CM | POA: Diagnosis not present

## 2022-09-21 DIAGNOSIS — R531 Weakness: Secondary | ICD-10-CM | POA: Diagnosis not present

## 2022-09-21 DIAGNOSIS — I129 Hypertensive chronic kidney disease with stage 1 through stage 4 chronic kidney disease, or unspecified chronic kidney disease: Secondary | ICD-10-CM | POA: Diagnosis not present

## 2022-09-21 DIAGNOSIS — Z885 Allergy status to narcotic agent status: Secondary | ICD-10-CM

## 2022-09-21 DIAGNOSIS — Z9981 Dependence on supplemental oxygen: Secondary | ICD-10-CM | POA: Diagnosis not present

## 2022-09-21 DIAGNOSIS — D6959 Other secondary thrombocytopenia: Secondary | ICD-10-CM | POA: Diagnosis not present

## 2022-09-21 DIAGNOSIS — Z833 Family history of diabetes mellitus: Secondary | ICD-10-CM

## 2022-09-21 DIAGNOSIS — E1159 Type 2 diabetes mellitus with other circulatory complications: Secondary | ICD-10-CM | POA: Diagnosis not present

## 2022-09-21 DIAGNOSIS — R6889 Other general symptoms and signs: Secondary | ICD-10-CM | POA: Diagnosis not present

## 2022-09-21 DIAGNOSIS — M62838 Other muscle spasm: Secondary | ICD-10-CM | POA: Diagnosis not present

## 2022-09-21 DIAGNOSIS — R1311 Dysphagia, oral phase: Secondary | ICD-10-CM | POA: Diagnosis not present

## 2022-09-21 DIAGNOSIS — N170 Acute kidney failure with tubular necrosis: Secondary | ICD-10-CM | POA: Diagnosis not present

## 2022-09-21 DIAGNOSIS — N1831 Chronic kidney disease, stage 3a: Secondary | ICD-10-CM | POA: Diagnosis present

## 2022-09-21 DIAGNOSIS — Z888 Allergy status to other drugs, medicaments and biological substances status: Secondary | ICD-10-CM

## 2022-09-21 DIAGNOSIS — T501X5A Adverse effect of loop [high-ceiling] diuretics, initial encounter: Secondary | ICD-10-CM | POA: Diagnosis not present

## 2022-09-21 DIAGNOSIS — Z993 Dependence on wheelchair: Secondary | ICD-10-CM

## 2022-09-21 LAB — CBC WITH DIFFERENTIAL/PLATELET
Abs Immature Granulocytes: 0.22 10*3/uL — ABNORMAL HIGH (ref 0.00–0.07)
Basophils Absolute: 0 10*3/uL (ref 0.0–0.1)
Basophils Relative: 0 %
Eosinophils Absolute: 0 10*3/uL (ref 0.0–0.5)
Eosinophils Relative: 0 %
HCT: 29.5 % — ABNORMAL LOW (ref 36.0–46.0)
Hemoglobin: 8.8 g/dL — ABNORMAL LOW (ref 12.0–15.0)
Immature Granulocytes: 2 %
Lymphocytes Relative: 10 %
Lymphs Abs: 1.1 10*3/uL (ref 0.7–4.0)
MCH: 24.9 pg — ABNORMAL LOW (ref 26.0–34.0)
MCHC: 29.8 g/dL — ABNORMAL LOW (ref 30.0–36.0)
MCV: 83.3 fL (ref 80.0–100.0)
Monocytes Absolute: 2.9 10*3/uL — ABNORMAL HIGH (ref 0.1–1.0)
Monocytes Relative: 25 %
Neutro Abs: 7.3 10*3/uL (ref 1.7–7.7)
Neutrophils Relative %: 63 %
Platelets: 51 10*3/uL — ABNORMAL LOW (ref 150–400)
RBC: 3.54 MIL/uL — ABNORMAL LOW (ref 3.87–5.11)
RDW: 21.2 % — ABNORMAL HIGH (ref 11.5–15.5)
Smear Review: NORMAL
WBC: 11.5 10*3/uL — ABNORMAL HIGH (ref 4.0–10.5)
nRBC: 0.4 % — ABNORMAL HIGH (ref 0.0–0.2)

## 2022-09-21 LAB — COMPREHENSIVE METABOLIC PANEL
ALT: 15 U/L (ref 0–44)
AST: 22 U/L (ref 15–41)
Albumin: 2.8 g/dL — ABNORMAL LOW (ref 3.5–5.0)
Alkaline Phosphatase: 131 U/L — ABNORMAL HIGH (ref 38–126)
Anion gap: 8 (ref 5–15)
BUN: 31 mg/dL — ABNORMAL HIGH (ref 8–23)
CO2: 22 mmol/L (ref 22–32)
Calcium: 8 mg/dL — ABNORMAL LOW (ref 8.9–10.3)
Chloride: 112 mmol/L — ABNORMAL HIGH (ref 98–111)
Creatinine, Ser: 1.57 mg/dL — ABNORMAL HIGH (ref 0.44–1.00)
GFR, Estimated: 30 mL/min — ABNORMAL LOW (ref >60)
Glucose, Bld: 140 mg/dL — ABNORMAL HIGH (ref 70–99)
Potassium: 4.2 mmol/L (ref 3.5–5.1)
Sodium: 142 mmol/L (ref 135–145)
Total Bilirubin: 1.5 mg/dL — ABNORMAL HIGH (ref 0.3–1.2)
Total Protein: 6.8 g/dL (ref 6.5–8.1)

## 2022-09-21 LAB — RESP PANEL BY RT-PCR (RSV, FLU A&B, COVID)  RVPGX2
Influenza A by PCR: NEGATIVE
Influenza B by PCR: NEGATIVE
Resp Syncytial Virus by PCR: NEGATIVE
SARS Coronavirus 2 by RT PCR: POSITIVE — AB

## 2022-09-21 LAB — GLUCOSE, CAPILLARY: Glucose-Capillary: 120 mg/dL — ABNORMAL HIGH (ref 70–99)

## 2022-09-21 LAB — CBG MONITORING, ED: Glucose-Capillary: 158 mg/dL — ABNORMAL HIGH (ref 70–99)

## 2022-09-21 LAB — TROPONIN I (HIGH SENSITIVITY)
Troponin I (High Sensitivity): 69 ng/L — ABNORMAL HIGH (ref <18)
Troponin I (High Sensitivity): 64 ng/L — ABNORMAL HIGH (ref <18)

## 2022-09-21 LAB — BRAIN NATRIURETIC PEPTIDE: B Natriuretic Peptide: 4115.6 pg/mL — ABNORMAL HIGH (ref 0.0–100.0)

## 2022-09-21 MED ORDER — TRAMADOL HCL 50 MG PO TABS: 50.0000 mg | ORAL_TABLET | Freq: Three times a day (TID) | ORAL | Status: DC | PRN

## 2022-09-21 MED ORDER — TIMOLOL MALEATE 0.5 % OP SOLN
1.0000 [drp] | Freq: Two times a day (BID) | OPHTHALMIC | Status: DC
  Administered 2022-09-22 – 2022-10-03 (×23): 1 [drp] via OPHTHALMIC
  Filled 2022-09-21: qty 5

## 2022-09-21 MED ORDER — BRIMONIDINE TARTRATE 0.2 % OP SOLN
1.0000 [drp] | Freq: Two times a day (BID) | OPHTHALMIC | Status: DC
  Administered 2022-09-22 – 2022-10-03 (×22): 1 [drp] via OPHTHALMIC
  Filled 2022-09-21: qty 5

## 2022-09-21 MED ORDER — ONDANSETRON HCL 4 MG PO TABS: 4.0000 mg | ORAL_TABLET | Freq: Four times a day (QID) | ORAL | Status: DC | PRN

## 2022-09-21 MED ORDER — INSULIN ASPART 100 UNIT/ML IJ SOLN: 0.0000 [IU] | Freq: Three times a day (TID) | INTRAMUSCULAR | Status: DC

## 2022-09-21 MED ORDER — FERROUS SULFATE 325 (65 FE) MG PO TABS
325.0000 mg | ORAL_TABLET | Freq: Every day | ORAL | Status: DC
  Administered 2022-09-22 – 2022-10-03 (×12): 325 mg via ORAL
  Filled 2022-09-21 (×12): qty 1

## 2022-09-21 MED ORDER — ACETAMINOPHEN 325 MG PO TABS: 650.0000 mg | ORAL_TABLET | Freq: Four times a day (QID) | ORAL | Status: DC | PRN

## 2022-09-21 MED ORDER — ASPIRIN 81 MG PO TBEC
81.0000 mg | DELAYED_RELEASE_TABLET | Freq: Every day | ORAL | Status: DC
  Administered 2022-09-22: 81 mg via ORAL
  Filled 2022-09-21 (×2): qty 1

## 2022-09-21 MED ORDER — ONDANSETRON HCL 4 MG/2ML IJ SOLN: 4.0000 mg | Freq: Four times a day (QID) | INTRAMUSCULAR | Status: DC | PRN

## 2022-09-21 MED ORDER — INSULIN ASPART 100 UNIT/ML IJ SOLN: 0.0000 [IU] | Freq: Every day | INTRAMUSCULAR | Status: DC

## 2022-09-21 MED ORDER — FUROSEMIDE 10 MG/ML IJ SOLN
40.0000 mg | Freq: Two times a day (BID) | INTRAMUSCULAR | Status: DC
  Administered 2022-09-22 – 2022-09-23 (×4): 40 mg via INTRAVENOUS
  Filled 2022-09-21 (×4): qty 4

## 2022-09-21 MED ORDER — BRINZOLAMIDE 1 % OP SUSP
1.0000 [drp] | Freq: Two times a day (BID) | OPHTHALMIC | Status: DC
  Administered 2022-09-22 – 2022-10-03 (×23): 1 [drp] via OPHTHALMIC
  Filled 2022-09-21: qty 10

## 2022-09-21 MED ORDER — ALBUTEROL SULFATE HFA 108 (90 BASE) MCG/ACT IN AERS
2.0000 | INHALATION_SPRAY | RESPIRATORY_TRACT | Status: DC | PRN
  Administered 2022-09-26: 2 via RESPIRATORY_TRACT
  Filled 2022-09-21: qty 6.7

## 2022-09-21 MED ORDER — POTASSIUM CHLORIDE CRYS ER 10 MEQ PO TBCR
20.0000 meq | EXTENDED_RELEASE_TABLET | Freq: Every day | ORAL | Status: DC
  Administered 2022-09-22 – 2022-10-03 (×12): 20 meq via ORAL
  Filled 2022-09-21 (×12): qty 2

## 2022-09-21 MED ORDER — METHOCARBAMOL 500 MG PO TABS: 500.0000 mg | ORAL_TABLET | Freq: Four times a day (QID) | ORAL | Status: DC | PRN

## 2022-09-21 MED ORDER — ACETAMINOPHEN 650 MG RE SUPP: 650.0000 mg | Freq: Four times a day (QID) | RECTAL | Status: DC | PRN

## 2022-09-21 MED ORDER — METOPROLOL SUCCINATE ER 25 MG PO TB24
12.5000 mg | ORAL_TABLET | Freq: Every day | ORAL | Status: DC
  Administered 2022-09-22 – 2022-10-02 (×11): 12.5 mg via ORAL
  Filled 2022-09-21 (×11): qty 1

## 2022-09-21 MED ORDER — HYDROCOD POLI-CHLORPHE POLI ER 10-8 MG/5ML PO SUER
5.0000 mL | Freq: Two times a day (BID) | ORAL | Status: DC | PRN
  Administered 2022-10-01: 5 mL via ORAL
  Filled 2022-09-21: qty 5

## 2022-09-21 MED ORDER — GUAIFENESIN-DM 100-10 MG/5ML PO SYRP
10.0000 mL | ORAL_SOLUTION | ORAL | Status: DC | PRN
  Administered 2022-09-30 – 2022-10-03 (×6): 10 mL via ORAL
  Filled 2022-09-21 (×8): qty 10

## 2022-09-21 NOTE — Assessment & Plan Note (Signed)
Continue Tylenol and Robaxin with tramadol as needed

## 2022-09-21 NOTE — Assessment & Plan Note (Addendum)
With her hands and feet being cold I do not think the fingersticks are accurate at this time.  Hemoglobin A1c only 6.8.  Will continue to watch sugars with daily chemistry.

## 2022-09-21 NOTE — Assessment & Plan Note (Signed)
Continue metoprolol. 

## 2022-09-21 NOTE — Assessment & Plan Note (Addendum)
Anemia and thrombocytopenia Last hemoglobin 8.9 and platelet count 44.

## 2022-09-21 NOTE — ED Notes (Signed)
Lab called for lab stick after multiple attempts for blood work.

## 2022-09-21 NOTE — ED Triage Notes (Signed)
Patient to ED with niece for weakness. Family states that she has slurred speech and been dropping things with left arm x3 days. Also states a left sided facial droop. Recently changed to torsemide.

## 2022-09-21 NOTE — Assessment & Plan Note (Deleted)
Elevated troponin, suspect demand ischemia Troponin of 60, without chest pain or EKG changes Continue to trend troponin Continue aspirin, metoprolol Follow echo to evaluate for regional wall motion abnormalities

## 2022-09-21 NOTE — ED Triage Notes (Signed)
Arrives from home via [facility]. Per report, family noticed that patient has been dropping things and slurred speech x 3 days.  Just noticed some possible facial droop today.  EMS reports equal facial movement, equal grip strength and arm lift equal.  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  Patient lives alone with caregivers in and out to help daily.  Stroke screen negative per EMS. CBG:  240.  VS wnl.

## 2022-09-21 NOTE — Assessment & Plan Note (Addendum)
Creatinine 1.57, above baseline of 0.95 a month prior.  Hold Lasix at this point and use gentle fluids.  Recheck creatinine tomorrow.

## 2022-09-21 NOTE — ED Notes (Signed)
Patient transported to MRI per [facility].

## 2022-09-21 NOTE — H&P (Signed)
History and Physical    [name] NWG:[id number] DOB: 01/27/1926 DOA: 09/21/2022 DOS: the patient was seen and examined on 09/21/2022 PCP: [name], MD  Patient coming from: Home  Chief Complaint:  Chief Complaint  Patient presents with   Weakness    HPI: Gabriela [name] is a 89 y.o. female with medical history significant for HTN, CAD, CKD 3a, DM, MGUS who presents to the ED by EMS with concerns for 3-day history of weakness, confusion, slurred speech and dropping things.  Most of the history is given by the granddaughter at the bedside who stated that she noticed that her grandmother has increasingly worsening swelling of the bilateral lower extremities up to the thighs in spite of taking her lasix 10 mg daily.  She has no prior history of CHF and states she was prescribed Lasix by her PCP.  She took her to the nephrologist on 2/1 changed to Lasix to torsemide and ordered compression socks.  She stated that since the medication was changed, her grandmother continue to have swelling and seem to be weaker and she also developed a cough that has become increasingly more congested.  She is not short of breath but at baseline she does not move around a lot.  She has had no fever or chills, vomiting or diarrhea.  She denies chest pain. ED course and data review: Afebrile with unremarkable vitals. Labs significant for troponin of 60 and BNP 4115.  WBC 11,500 with hemoglobin 8.8, platelets 51,000.  Creatinine 1.57 above baseline of 0.95 a month ago respiratory viral panel positive for COVID.  EKG, personally viewed and interpreted showing sinus at 77 with T wave inversion in lateral leads.  Chest x-ray nonacute and shows atrophy and chronic small vessel ischemic changes.  Chest x-ray showing bilateral pleural effusions with basilar atelectasis. MRI ordered to evaluate for stroke.  Hospitalist consulted for admission.     Past Medical History:  Diagnosis Date    Arthritis    Diabetes mellitus ([place])    Glaucoma    Hyperlipidemia    Hypertension    Shortness of breath    with exertion   Past Surgical History:  Procedure Laterality Date   CARDIAC CATHETERIZATION  04/22/13   CHOLECYSTECTOMY     CORONARY ARTERY BYPASS GRAFT N/A 05/31/2013   Procedure: Coronary Artery Bypass Grafting Times Three Using Left Internal Mammary Artery and Right Saphenous Leg Vein Harvested Endoscopically;  Surgeon: Ivin Poot, MD;  Location: [facility];  Service: Open Heart Surgery;  Laterality: N/A;   ERCP N/A 10/20/2020   Procedure: ENDOSCOPIC RETROGRADE CHOLANGIOPANCREATOGRAPHY (ERCP);  Surgeon: Lucilla Lame, MD;  Location: [facility] ENDOSCOPY;  Service: Endoscopy;  Laterality: N/A;   ERCP N/A 12/22/2020   Procedure: ENDOSCOPIC RETROGRADE CHOLANGIOPANCREATOGRAPHY (ERCP);  Surgeon: Lucilla Lame, MD;  Location: [facility] ENDOSCOPY;  Service: Endoscopy;  Laterality: N/A;   EYE SURGERY     HIP SURGERY     right   INTRAOPERATIVE TRANSESOPHAGEAL ECHOCARDIOGRAM N/A 05/31/2013   Procedure: INTRAOPERATIVE TRANSESOPHAGEAL ECHOCARDIOGRAM;  Surgeon: Ivin Poot, MD;  Location: [place];  Service: Open Heart Surgery;  Laterality: N/A;   JOINT REPLACEMENT     MITRAL VALVE REPAIR N/A 05/31/2013   Procedure: MITRAL VALVE REPAIR (MVR);  Surgeon: Ivin Poot, MD;  Location: [place];  Service: Open Heart Surgery;  Laterality: N/A;   ORIF FEMUR FRACTURE Bilateral 05/26/2022   Procedure: OPEN REDUCTION INTERNAL FIXATION (ORIF) DISTAL FEMUR FRACTURE;  Surgeon: Altamese Sandpoint, MD;  Location: [place];  Service: Orthopedics;  Laterality: Bilateral;   TONSILLECTOMY     Social History:  reports that she has never smoked. She has never used smokeless tobacco. She reports that she does not drink alcohol and does not use drugs.  Allergies  Allergen Reactions   Sulfa Antibiotics Rash    Family History  Problem Relation Age of Onset   Diabetes Sister    Hypertension Mother     Prior to Admission  medications   Medication Sig Start Date End Date Taking? Authorizing Provider  acetaminophen (TYLENOL) 325 MG tablet Take 2 tablets (650 mg total) by mouth 3 (three) times daily. Wean as able 06/21/22   Love, Ivan Anchors, PA-C  albuterol (VENTOLIN HFA) 108 (90 Base) MCG/ACT inhaler Inhale 2 puffs into the lungs every 4 (four) hours as needed for wheezing or shortness of breath. 09/12/22   Simmons-Robinson, Makiera, MD  aspirin EC 81 MG tablet Take 1 tablet (81 mg total) by mouth daily. 06/21/22   Love, Ivan Anchors, PA-C  calcium carbonate (TUMS - DOSED IN MG ELEMENTAL CALCIUM) 500 MG chewable tablet Chew 2 tablets (400 mg of elemental calcium total) by mouth daily. 06/21/22   Love, Ivan Anchors, PA-C  diclofenac Sodium (VOLTAREN) 1 % GEL Apply 2 g topically 4 (four) times daily. 06/21/22   Love, Ivan Anchors, PA-C  ferrous sulfate 325 (65 FE) MG tablet Take 1 tablet (325 mg total) by mouth daily with breakfast. 08/10/22   Simmons-Robinson, Riki Sheer, MD  furosemide (LASIX) 20 MG tablet Take 1 tablet (20 mg total) by mouth as needed. Leg Swelling M79.89 09/09/22   Simmons-Robinson, Riki Sheer, MD  ipratropium (ATROVENT HFA) 17 MCG/ACT inhaler Inhale 2 puffs into the lungs every 4 (four) hours as needed for wheezing. 09/12/22   Simmons-Robinson, Makiera, MD  ipratropium-albuterol (DUONEB) 0.5-2.5 (3) MG/3ML SOLN Take 3 mLs by nebulization every 4 (four) hours as needed. 09/12/22   Simmons-Robinson, Makiera, MD  loratadine (CLARITIN) 10 MG tablet TAKE 1 TABLET BY MOUTH EVERY DAY 09/04/22   Simmons-Robinson, Riki Sheer, MD  methocarbamol (ROBAXIN) 500 MG tablet Take 1 tablet (500 mg total) by mouth every 6 (six) hours as needed for muscle spasms. 06/21/22   Love, Ivan Anchors, PA-C  metoprolol succinate (TOPROL-XL) 25 MG 24 hr tablet Take 0.5 tablets (12.5 mg total) by mouth daily. 06/23/22   Simmons-Robinson, Riki Sheer, MD  Multiple Vitamin (MULTIVITAMIN WITH MINERALS) TABS tablet Take 1 tablet by mouth daily. 06/15/22   Love, Ivan Anchors, PA-C   polyethylene glycol powder (GLYCOLAX/MIRALAX) 17 GM/SCOOP powder Take 17 g by mouth daily. 06/21/22   Love, Ivan Anchors, PA-C  potassium chloride (KLOR-CON M) 10 MEQ tablet Take 2 tablets (20 mEq total) by mouth daily. 08/22/22   Simmons-Robinson, Makiera, MD  SIMBRINZA 1-0.2 % SUSP Place 1 drop into both eyes 2 (two) times daily. 12/07/21   [provider]  sodium bicarbonate 650 MG tablet Take 1 tablet (650 mg total) by mouth 2 (two) times daily. 08/10/22   Simmons-Robinson, Makiera, MD  timolol (TIMOPTIC) 0.5 % ophthalmic solution Place 1 drop into both eyes 2 (two) times daily. 11/16/21   [provider]  traMADol (ULTRAM) 50 MG tablet Take 1 tablet (50 mg total) by mouth every 8 (eight) hours as needed. 09/12/22   Simmons-Robinson, Makiera, MD  Vitamin D, Ergocalciferol, (DRISDOL) 1.25 MG (50000 UNIT) CAPS capsule Take 1 capsule (50,000 Units total) by mouth every 7 (seven) days. 06/25/22   Bary Leriche, PA-C    Physical Exam: Vitals:  09/21/22 1706 09/21/22 1927  BP: (!) 126/99 (!) 141/70  Pulse: 78 76  Resp: 18 20  Temp: 98.1 F (36.7 C) (!) 97.4 F (36.3 C)  TempSrc: Oral Oral  SpO2: 98% 99%   Physical Exam Vitals and nursing note reviewed.  Constitutional:      General: She is not in acute distress.    Comments: Gabriela [name] elderly female.   Wet cough  HENT:     Head: Normocephalic and atraumatic.  Cardiovascular:     Rate and Rhythm: Normal rate and regular rhythm.     Heart sounds: Normal heart sounds.  Pulmonary:     Effort: Tachypnea present.     Breath sounds: Examination of the right-lower field reveals decreased breath sounds. Examination of the left-lower field reveals decreased breath sounds. Decreased breath sounds present.  Abdominal:     Palpations: Abdomen is soft.     Tenderness: There is no abdominal tenderness.  Musculoskeletal:     Right lower leg: Edema present.     Left lower leg: Edema present.  Neurological:     General: No  focal deficit present.     Labs on Admission: I have personally reviewed following labs and imaging studies  CBC: Recent Labs  Lab 09/21/22 2033  WBC 11.5*  NEUTROABS 7.3  HGB 8.8*  HCT 29.5*  MCV 83.3  PLT 51*   Basic Metabolic Panel: Recent Labs  Lab 09/21/22 2033  NA 142  K 4.2  CL 112*  CO2 22  GLUCOSE 140*  BUN 31*  CREATININE 1.57*  CALCIUM 8.0*   GFR: CrCl cannot be calculated (Unknown ideal weight.). Liver Function Tests: Recent Labs  Lab 09/21/22 2033  AST 22  ALT 15  ALKPHOS 131*  BILITOT 1.5*  PROT 6.8  ALBUMIN 2.8*   No results for input(s): "LIPASE", "AMYLASE" in the last 168 hours. No results for input(s): "AMMONIA" in the last 168 hours. Coagulation Profile: No results for input(s): "INR", "PROTIME" in the last 168 hours. Cardiac Enzymes: No results for input(s): "CKTOTAL", "CKMB", "CKMBINDEX", "TROPONINI" in the last 168 hours. BNP (last 3 results) No results for input(s): "PROBNP" in the last 8760 hours. HbA1C: No results for input(s): "HGBA1C" in the last 72 hours. CBG: Recent Labs  Lab 09/21/22 1706  GLUCAP 158*   Lipid Profile: No results for input(s): "CHOL", "HDL", "LDLCALC", "TRIG", "CHOLHDL", "LDLDIRECT" in the last 72 hours. Thyroid Function Tests: No results for input(s): "TSH", "T4TOTAL", "FREET4", "T3FREE", "THYROIDAB" in the last 72 hours. Anemia Panel: No results for input(s): "VITAMINB12", "FOLATE", "FERRITIN", "TIBC", "IRON", "RETICCTPCT" in the last 72 hours. Urine analysis:    Component Value Date/Time   COLORURINE YELLOW (A) 04/16/2022 2247   APPEARANCEUR CLOUDY (A) 04/16/2022 2247   LABSPEC 1.015 04/16/2022 2247   PHURINE 5.5 04/16/2022 2247   GLUCOSEU NEGATIVE 04/16/2022 2247   HGBUR TRACE (A) 04/16/2022 2247   BILIRUBINUR NEGATIVE 04/16/2022 2247   KETONESUR NEGATIVE 04/16/2022 2247   PROTEINUR NEGATIVE 04/16/2022 2247   UROBILINOGEN 0.2 05/28/2013 1328   NITRITE NEGATIVE 04/16/2022 2247    LEUKOCYTESUR NEGATIVE 04/16/2022 2247    Radiological Exams on Admission: CT HEAD WO CONTRAST  Result Date: 09/21/2022 CLINICAL DATA:  Neuro deficit, acute, stroke suspected. Slurred speech. Weakness. EXAM: CT HEAD WITHOUT CONTRAST TECHNIQUE: Contiguous axial images were obtained from the base of the skull through the vertex without intravenous contrast. RADIATION DOSE REDUCTION: This exam was performed according to the departmental dose-optimization program which includes automated exposure control, adjustment of the  mA and/or kV according to patient size and/or use of iterative reconstruction technique. COMPARISON:  02/10/2021 FINDINGS: Brain: Age related atrophy. Chronic small-vessel ischemic changes of the hemispheric white matter. No sign of acute infarction, mass lesion, hemorrhage, hydrocephalus or extra-axial collection. Vascular: There is atherosclerotic calcification of the major vessels at the base of the brain. Skull: Negative Sinuses/Orbits: Clear/normal Other: None IMPRESSION: No acute CT finding. Age related atrophy and chronic small-vessel ischemic changes of the white matter. Electronically Signed   By: Nelson Chimes M.D.   On: 09/21/2022 19:57   DG Chest 2 View  Result Date: 09/21/2022 CLINICAL DATA:  Cough. Weakness. Altered mental status. History of diabetes, hypertension, coronary bypass. EXAM: CHEST - 2 VIEW COMPARISON:  06/26/2022 FINDINGS: Postoperative changes in the mediastinum. Mild cardiac enlargement. No vascular congestion. Moderate bilateral pleural effusions with basilar atelectasis. No pneumothorax. Mediastinal contours appear intact. Calcification of the aorta. Degenerative changes in the spine and shoulders. IMPRESSION: Bilateral pleural effusions with basilar atelectasis. Electronically Signed   By: Lucienne Capers M.D.   On: 09/21/2022 19:56     Data Reviewed: Relevant notes from primary care and specialist visits, past discharge summaries as available in EHR,  including Care Everywhere. Prior diagnostic testing as pertinent to current admission diagnoses Updated medications and problem lists for reconciliation ED course, including vitals, labs, imaging, treatment and response to treatment Triage notes, nursing and pharmacy notes and ED provider's notes Notable results as noted in HPI   Assessment and Plan: * Acute clinical systolic heart failure (HCC) Bilateral pleural effusion Patient with several weeks of lower extremity edema, BNP over 4000 and chest x-ray showing bilateral pleural effusions Suspect ischemic cardiomyopathy given history of CAD and CABG Patient has a remote echo from 2014 that showed EF 40 to 45% IV Lasix Continue metoprolol Daily weights with intake and output monitoring Echocardiogram in the a.m.  CAD with hx of CABG(coronary artery disease) Elevated troponin, suspect demand ischemia Troponin of 60, without chest pain or EKG changes Continue to trend troponin Continue aspirin, metoprolol Follow echo to evaluate for regional wall motion abnormalities  COVID-19 virus infection Weakness, cough, confusion likely related to COVID and appears to have started about 5 days prior per report from granddaughter Daughter mentions recent exposure to family members who had COVID Supportive care with antitussives, albuterol as needed Airborne precautions  Acute metabolic encephalopathy Confusion, weakness and slurred speech reported, however neuroexam was nonfocal CT head nonacute Follow-up MRI to evaluate for stroke  Acute renal failure superimposed on stage 3a chronic kidney disease ([place]) Creatinine 1.57, above baseline of 0.95 a month prior Patient recently established care with nephrology on 1/29 Continue to monitor as patient will be on IV Lasix  Generalized weakness At baseline, patient is currently mostly sedentary following a bilateral femoral fracture in November 2023 while working with PT Patient lives alone and  family members are in and out Physical therapy evaluation  Type 2 diabetes mellitus with circulatory disorder (HCC) Sliding scale insulin coverage  MGUS (monoclonal gammopathy of unknown significance) Anemia and thrombocytopenia Hemoglobin 8.8 around baseline and platelets 51,000 up from prior baseline in the 30,000s Patient was seen by hematology during an inpatient stay in November 2023 with likely diagnosis of MGUS but did not follow-up after discharge  Arthritis, degenerative Continue Tylenol and Robaxin with tramadol as needed  Essential (primary) hypertension Continue metoprolol    DVT prophylaxis: SCD only due to thrombocytopenia  Consults: none  Advance Care Planning:   Code Status: Prior  Family Communication: Granddaughter Oncologist  Disposition Plan: Back to previous home environment  Severity of Illness: The appropriate patient status for this patient is INPATIENT. Inpatient status is judged to be reasonable and necessary in order to provide the required intensity of service to ensure the patient's safety. The patient's presenting symptoms, physical exam findings, and initial radiographic and laboratory data in the context of their chronic comorbidities is felt to place them at high risk for further clinical deterioration. Furthermore, it is not anticipated that the patient will be medically stable for discharge from the hospital within 2 midnights of admission.   * I certify that at the point of admission it is my clinical judgment that the patient will require inpatient hospital care spanning beyond 2 midnights from the point of admission due to high intensity of service, high risk for further deterioration and high frequency of surveillance required.*  Author: Athena Masse, MD 09/21/2022 10:34 PM  For on call review [email or web address].

## 2022-09-21 NOTE — Assessment & Plan Note (Deleted)
Bilateral pleural effusion Patient with several weeks of lower extremity edema, BNP over 4000 and chest x-ray showing bilateral pleural effusions Suspect ischemic cardiomyopathy given history of CAD and CABG Patient has a remote echo from 2014 that showed EF 40 to 45% IV Lasix Continue metoprolol Daily weights with intake and output monitoring Echocardiogram in the a.m.

## 2022-09-21 NOTE — Assessment & Plan Note (Addendum)
At baseline, patient is currently mostly sedentary following a bilateral femoral fracture in November 2023 while working with PT Patient lives alone and family members are with her all the time. Physical therapy evaluation

## 2022-09-21 NOTE — Assessment & Plan Note (Addendum)
This has improved.

## 2022-09-21 NOTE — Assessment & Plan Note (Addendum)
With wheezing today I did order nebulizer treatments and 1 dose of prednisone.

## 2022-09-21 NOTE — ED Notes (Signed)
Verbal report received from [employer].

## 2022-09-21 NOTE — ED Provider Notes (Signed)
[facility] Provider Note    Event Date/Time   First MD Initiated Contact with Patient 09/21/22 [phone]     (approximate)   History   Chief Complaint Weakness   HPI  Gabriela [name] is a 89 y.o. female with past medical history of hypertension, diabetes, CAD, CKD, COPD, MGUS, and arthritis who presents to the ED for weakness.  Per granddaughter at bedside, patient has had slurred speech consistently for the past 2 days.  Family has also noted that she does not seem to understand as well as her usual and they speak to her.  She has been dropping things with both hands and they also report concern for left-sided facial droop.  They state that she is very sharp at baseline with no confusion, however she has been bedbound following femur fracture last year.     Physical Exam   Triage Vital Signs: ED Triage Vitals  Enc Vitals Group     BP 09/21/22 1706 (!) 126/99     Pulse Rate 09/21/22 1706 78     Resp 09/21/22 1706 18     Temp 09/21/22 1706 98.1 F (36.7 C)     Temp Source 09/21/22 1706 Oral     SpO2 09/21/22 1706 98 %     Weight --      Height --      Head Circumference --      Peak Flow --      Pain Score 09/21/22 1704 0     Pain Loc --      Pain Edu? --      Excl. in [place]? --     Most recent vital signs: Vitals:   09/21/22 1706 09/21/22 1927  BP: (!) 126/99 (!) 141/70  Pulse: 78 76  Resp: 18 20  Temp: 98.1 F (36.7 C) (!) 97.4 F (36.3 C)  SpO2: 98% 99%    Constitutional: Alert and oriented. Eyes: Conjunctivae are normal. Head: Atraumatic. Nose: No congestion/rhinnorhea. Mouth/Throat: Mucous membranes are moist.  Cardiovascular: Normal rate, regular rhythm. Grossly normal heart sounds.  2+ radial pulses bilaterally. Respiratory: Normal respiratory effort.  No retractions. Lungs CTAB. Gastrointestinal: Soft and nontender. No distention. Musculoskeletal: No lower extremity tenderness nor edema.  Neurologic: Slurred speech noted.   Subtle left-sided facial droop noted, strength 5 out of 5 in bilateral upper extremities, strength 4 out of 5 in bilateral lower extremities.    ED Results / Procedures / Treatments   Labs (all labs ordered are listed, but only abnormal results are displayed) Labs Reviewed  RESP PANEL BY RT-PCR (RSV, FLU A&B, COVID)  [id number] - Abnormal; Notable for the following components:      Result Value   SARS Coronavirus 2 by RT PCR POSITIVE (*)    All other components within normal limits  CBC WITH DIFFERENTIAL/PLATELET - Abnormal; Notable for the following components:   WBC 11.5 (*)    RBC 3.54 (*)    Hemoglobin 8.8 (*)    HCT 29.5 (*)    MCH 24.9 (*)    MCHC 29.8 (*)    RDW 21.2 (*)    Platelets 51 (*)    nRBC 0.4 (*)    Monocytes Absolute 2.9 (*)    Abs Immature Granulocytes 0.22 (*)    All other components within normal limits  COMPREHENSIVE METABOLIC PANEL - Abnormal; Notable for the following components:   Chloride 112 (*)    Glucose, Bld 140 (*)    BUN 31 (*)  Creatinine, Ser 1.57 (*)    Calcium 8.0 (*)    Albumin 2.8 (*)    Alkaline Phosphatase 131 (*)    Total Bilirubin 1.5 (*)    GFR, Estimated 30 (*)    All other components within normal limits  BRAIN NATRIURETIC PEPTIDE - Abnormal; Notable for the following components:   B Natriuretic Peptide 4,115.6 (*)    All other components within normal limits  CBG MONITORING, ED - Abnormal; Notable for the following components:   Glucose-Capillary 158 (*)    All other components within normal limits  TROPONIN I (HIGH SENSITIVITY) - Abnormal; Notable for the following components:   Troponin I (High Sensitivity) 69 (*)    All other components within normal limits  URINALYSIS, ROUTINE W REFLEX MICROSCOPIC  TROPONIN I (HIGH SENSITIVITY)     EKG  ED ECG REPORT I, Blake Divine, the attending physician, personally viewed and interpreted this ECG.   Date: 09/21/2022  EKG Time: 16:59  Rate: 77  Rhythm: normal sinus  rhythm  Axis: Normal  Intervals:none  ST&T Change: None  RADIOLOGY CT head reviewed and interpreted by me with no hemorrhage or midline shift.  Chest x-ray reviewed and interpreted by me with bilateral effusions, no focal infiltrate noted.  PROCEDURES:  Critical Care performed: No  Procedures   MEDICATIONS ORDERED IN ED: Medications - No data to display   IMPRESSION / MDM / [place] / ED COURSE  I reviewed the triage vital signs and the nursing notes.                              89 y.o. female with past medical history of hypertension, diabetes, CAD, CKD, COPD, MGUS, and arthritis who presents to the ED complaining of 2 days of slurred speech with poor speech comprehension and occasionally dropping objects.  Patient's presentation is most consistent with acute presentation with potential threat to life or bodily function.  Differential diagnosis includes, but is not limited to, stroke, intracranial hemorrhage, UTI, electrolyte abnormality, pneumonia, anemia, viral illness.  Patient nontoxic-appearing and in no acute distress, vital signs are unremarkable.  She is awake and alert but does seem to have some slurred speech with subtle left-sided facial droop, no focal weakness noted in her extremities.  We will further assess with CT head, but she is outside the window for any intervention on potential stroke.  She does have a cough here in the ED, will further assess for infectious process with chest x-ray and urinalysis as well as viral testing.  Labs are pending at this time.  CT head is negative for acute process, chest x-ray shows bilateral pleural effusions but no focal infiltrate.  Labs remarkable for AKI but without significant electrolyte abnormality, troponin elevated but similar to prior baseline.  BNP is markedly elevated and suspect effusions are due to CHF, patient may benefit from diuresis.  Hemoglobin slightly downtrending but no evidence of bleeding at this  time.  Patient did test positive for COVID-19, which could be contributing to her cough.  Case discussed with hospitalist for admission for further stroke workup along with management of AKI and COVID-19.      FINAL CLINICAL IMPRESSION(S) / ED DIAGNOSES   Final diagnoses:  Slurred speech  AKI (acute kidney injury) ([place])  Pleural effusion  COVID-19     Rx / DC Orders   ED Discharge Orders     None  Note:  This document was prepared using Dragon voice recognition software and may include unintentional dictation errors.   Blake Divine, MD 09/21/22 2155

## 2022-09-22 ENCOUNTER — Inpatient Hospital Stay (HOSPITAL_COMMUNITY)
Admit: 2022-09-22 | Discharge: 2022-09-22 | Disposition: A | Discharge status: Home / Self Care | Payer: 59 | Attending: Internal Medicine | Admitting: Internal Medicine

## 2022-09-22 DIAGNOSIS — I639 Cerebral infarction, unspecified: Secondary | ICD-10-CM | POA: Diagnosis not present

## 2022-09-22 DIAGNOSIS — U071 COVID-19: Secondary | ICD-10-CM

## 2022-09-22 DIAGNOSIS — I5022 Chronic systolic (congestive) heart failure: Secondary | ICD-10-CM | POA: Diagnosis not present

## 2022-09-22 DIAGNOSIS — I5021 Acute systolic (congestive) heart failure: Secondary | ICD-10-CM | POA: Diagnosis not present

## 2022-09-22 DIAGNOSIS — R531 Weakness: Secondary | ICD-10-CM | POA: Diagnosis not present

## 2022-09-22 DIAGNOSIS — I2510 Atherosclerotic heart disease of native coronary artery without angina pectoris: Secondary | ICD-10-CM | POA: Diagnosis not present

## 2022-09-22 LAB — BASIC METABOLIC PANEL
Anion gap: 10 (ref 5–15)
BUN: 31 mg/dL — ABNORMAL HIGH (ref 8–23)
CO2: 20 mmol/L — ABNORMAL LOW (ref 22–32)
Calcium: 7.7 mg/dL — ABNORMAL LOW (ref 8.9–10.3)
Chloride: 113 mmol/L — ABNORMAL HIGH (ref 98–111)
Creatinine, Ser: 1.51 mg/dL — ABNORMAL HIGH (ref 0.44–1.00)
GFR, Estimated: 32 mL/min — ABNORMAL LOW (ref >60)
Glucose, Bld: 118 mg/dL — ABNORMAL HIGH (ref 70–99)
Potassium: 4.1 mmol/L (ref 3.5–5.1)
Sodium: 143 mmol/L (ref 135–145)

## 2022-09-22 LAB — ECHOCARDIOGRAM COMPLETE
AR max vel: 2.07 cm2
AV Area VTI: 2.03 cm2
AV Area mean vel: 1.79 cm2
AV Mean grad: 4 mmHg
AV Peak grad: 7 mmHg
Ao pk vel: 1.32 m/s
Area-P 1/2: 2.66 cm2
MV VTI: 1.35 cm2
S' Lateral: 3.5 cm
Weight: 2134.4 oz

## 2022-09-22 LAB — CBC
HCT: 29.3 % — ABNORMAL LOW (ref 36.0–46.0)
Hemoglobin: 8.9 g/dL — ABNORMAL LOW (ref 12.0–15.0)
MCH: 25 pg — ABNORMAL LOW (ref 26.0–34.0)
MCHC: 30.4 g/dL (ref 30.0–36.0)
MCV: 82.3 fL (ref 80.0–100.0)
Platelets: 44 10*3/uL — ABNORMAL LOW (ref 150–400)
RBC: 3.56 MIL/uL — ABNORMAL LOW (ref 3.87–5.11)
RDW: 21.2 % — ABNORMAL HIGH (ref 11.5–15.5)
WBC: 9.9 10*3/uL (ref 4.0–10.5)
nRBC: 0.3 % — ABNORMAL HIGH (ref 0.0–0.2)

## 2022-09-22 LAB — GLUCOSE, CAPILLARY
Glucose-Capillary: 70 mg/dL (ref 70–99)
Glucose-Capillary: 89 mg/dL (ref 70–99)
Glucose-Capillary: 105 mg/dL — ABNORMAL HIGH (ref 70–99)
Glucose-Capillary: 84 mg/dL (ref 70–99)

## 2022-09-22 MED ORDER — SODIUM CHLORIDE 0.9 % IV SOLN: 100.0000 mg | Freq: Every day | INTRAVENOUS | Status: DC

## 2022-09-22 MED ORDER — SODIUM CHLORIDE 0.9 % IV SOLN
100.0000 mg | Freq: Once | INTRAVENOUS | Status: AC
  Administered 2022-09-22: 100 mg via INTRAVENOUS
  Filled 2022-09-22: qty 20

## 2022-09-22 NOTE — Evaluation (Signed)
Clinical/Bedside Swallow Evaluation Patient Details  Name: Gabriela [name] MRN: [id number] Date of Birth: February 12, 1926  Today's Date: 09/22/2022 Time: SLP Start Time (ACUTE ONLY): 54 SLP Stop Time (ACUTE ONLY): 5732 SLP Time Calculation (min) (ACUTE ONLY): 20 min  Past Medical History:  Past Medical History:  Diagnosis Date   Arthritis    Diabetes mellitus ([place])    Glaucoma    Hyperlipidemia    Hypertension    Shortness of breath    with exertion   Past Surgical History:  Past Surgical History:  Procedure Laterality Date   CARDIAC CATHETERIZATION  04/22/13   CHOLECYSTECTOMY     CORONARY ARTERY BYPASS GRAFT N/A 05/31/2013   Procedure: Coronary Artery Bypass Grafting Times Three Using Left Internal Mammary Artery and Right Saphenous Leg Vein Harvested Endoscopically;  Surgeon: Ivin Poot, MD;  Location: [place];  Service: Open Heart Surgery;  Laterality: N/A;   ERCP N/A 10/20/2020   Procedure: ENDOSCOPIC RETROGRADE CHOLANGIOPANCREATOGRAPHY (ERCP);  Surgeon: Lucilla Lame, MD;  Location: [facility] ENDOSCOPY;  Service: Endoscopy;  Laterality: N/A;   ERCP N/A 12/22/2020   Procedure: ENDOSCOPIC RETROGRADE CHOLANGIOPANCREATOGRAPHY (ERCP);  Surgeon: Lucilla Lame, MD;  Location: [facility] ENDOSCOPY;  Service: Endoscopy;  Laterality: N/A;   EYE SURGERY     HIP SURGERY     right   INTRAOPERATIVE TRANSESOPHAGEAL ECHOCARDIOGRAM N/A 05/31/2013   Procedure: INTRAOPERATIVE TRANSESOPHAGEAL ECHOCARDIOGRAM;  Surgeon: Ivin Poot, MD;  Location: [place];  Service: Open Heart Surgery;  Laterality: N/A;   JOINT REPLACEMENT     MITRAL VALVE REPAIR N/A 05/31/2013   Procedure: MITRAL VALVE REPAIR (MVR);  Surgeon: Ivin Poot, MD;  Location: [place];  Service: Open Heart Surgery;  Laterality: N/A;   ORIF FEMUR FRACTURE Bilateral 05/26/2022   Procedure: OPEN REDUCTION INTERNAL FIXATION (ORIF) DISTAL FEMUR FRACTURE;  Surgeon: Altamese Menlo, MD;  Location: [place];  Service: Orthopedics;  Laterality: Bilateral;    TONSILLECTOMY     HPI:  Per H&P, pt is a 89 y.o. female with medical history significant for HTN, CAD, CKD 3a, DM, MGUS who presents to the ED by EMS with concerns for 3-day history of weakness, confusion, slurred speech and dropping things.  Most of the history is given by the granddaughter at the bedside who stated that she noticed that her grandmother has increasingly worsening swelling of the bilateral lower extremities up to the thighs in spite of taking her lasix 10 mg daily. She has no prior history of CHF and states she was prescribed Lasix by her PCP.  She took her to the nephrologist on 2/1 changed to Lasix to torsemide and ordered compression socks.  She stated that since the medication was changed, her grandmother continue to have swelling and seem to be weaker and she also developed a cough that has become increasingly more congested.  She is not short of breath but at baseline she does not move around a lot.  She has had no fever or chills, vomiting or diarrhea.  She denies chest pain. ED course and data review: Afebrile with unremarkable vitals. WBC 11,500. Respiratory viral panel positive for COVID. Chest x-ray nonacute and shows atrophy and chronic small vessel ischemic changes.  Chest x-ray showing bilateral pleural effusions with basilar atelectasis.  MRI ordered to evaluate for stroke.    Assessment / Plan / Recommendation  Clinical Impression   Pt seen today for BSE. Pt alert, cooperative, and pleasant throughout eval. Pt sitting up in bed w/ breakfast finished upon SLP arrival. Pt  reported having no difficulties eating/drinking her breakfast. Pt cooperative in eating/drinking more for eval. Pt left sitting up in bed w/ bed alarm set and call button in reach.  Pt w/ baseline congested cough.  Pt on RA; afebrile; WBC WNL.  Pt did not appear to exhibit any overt, immediate s/s of oropharyngeal dysphagia. Pt w/ baseline cough that did not appear to be immediate to or consistent w/ po's.  Pt exhibited grossly FUNCTIONAL swallow in the setting of missing dentition and baseline cough.   Administered trials of thin liquids via straw, puree, and solids. During oral phase, pt exhibited adequate/timely gumming/mashing of bolus, clear oral cavity post-po's, good bolus control, and timely A-P bolus transit. During pharyngeal phase pt exhibited seemingly timely pharyngeal swallow and clear vocal quality post-po's. Pt coughed intermittently throughout eval. Cough did not appear to be immediate nor consistent w/ po's. Pt reported no difficulties/concerns w/ her swallowing.  Pt educated on SLP POC, diet recommendation, and general aspiration precautions. Pt appreciative and agreed.  Recommend Dys 3 diet (for ease of gumming/mashing in setting of missing dentition) w/ thin liquids. Recommend General aspiration precautions (sitting upright during po's, small bites/sips, eating slowly). Assistance for set-up. Recommend meds whole w/ puree. ST services will monitor pt's swallowing in next 1-2 days for diet toleration. RN/Pt updated and agreed. SLP Visit Diagnosis: Dysphagia, oral phase (R13.11)    Aspiration Risk  Mild aspiration risk (d/t respiratory status/missing dentition)    Diet Recommendation  Recommend Dys 3 diet (for ease of gumming/mashing in setting of missing dentition) w/ thin liquids. Recommend General aspiration precautions (sitting upright during po's, small bites/sips, eating slowly).   Medication Administration: Whole meds with puree    Other  Recommendations Oral Care Recommendations: Oral care QID;Oral care before and after PO    Recommendations for follow up therapy are one component of a multi-disciplinary discharge planning process, led by the attending physician.  Recommendations may be updated based on patient status, additional functional criteria and insurance authorization.  Follow up Recommendations Follow physician's recommendations for discharge plan and follow up  therapies      Assistance Recommended at Discharge  PRN  Functional Status Assessment Patient has had a recent decline in their functional status and demonstrates the ability to make significant improvements in function in a reasonable and predictable amount of time.  Frequency and Duration min 2x/week  2 weeks       Prognosis Prognosis for improved oropharyngeal function: Good      Swallow Study   General Date of Onset: 09/21/22 HPI: Per H&P, pt is a 89 y.o. female with medical history significant for HTN, CAD, CKD 3a, DM, MGUS who presents to the ED by EMS with concerns for 3-day history of weakness, confusion, slurred speech and dropping things.  Most of the history is given by the granddaughter at the bedside who stated that she noticed that her grandmother has increasingly worsening swelling of the bilateral lower extremities up to the thighs in spite of taking her lasix 10 mg daily. She has no prior history of CHF and states she was prescribed Lasix by her PCP.  She took her to the nephrologist on 2/1 changed to Lasix to torsemide and ordered compression socks.  She stated that since the medication was changed, her grandmother continue to have swelling and seem to be weaker and she also developed a cough that has become increasingly more congested.  She is not short of breath but at baseline she does not move  around a lot.  She has had no fever or chills, vomiting or diarrhea.  She denies chest pain. ED course and data review: Afebrile with unremarkable vitals. WBC 11,500. Respiratory viral panel positive for COVID. Chest x-ray nonacute and shows atrophy and chronic small vessel ischemic changes.  Chest x-ray showing bilateral pleural effusions with basilar atelectasis.  MRI ordered to evaluate for stroke. Type of Study: Bedside Swallow Evaluation Previous Swallow Assessment: 06/05/2013 (BSE, Pt on regular diet w/ thins) Diet Prior to this Study: Regular;Thin liquids (Level 0) Temperature  Spikes Noted: No (WBC 9.9) Respiratory Status: Room air History of Recent Intubation: No Behavior/Cognition: Alert;Cooperative;Pleasant mood Oral Cavity Assessment: Within Functional Limits Oral Care Completed by SLP: No Oral Cavity - Dentition: Missing dentition (Reported having "four teeth," dentures at home) Vision: Functional for self-feeding Self-Feeding Abilities: Able to feed self;Needs set up (Min) Patient Positioning: Upright in bed Baseline Vocal Quality: Normal Volitional Cough: Strong Volitional Swallow: Able to elicit    Oral/Motor/Sensory Function Overall Oral Motor/Sensory Function: Within functional limits   Ice Chips Ice chips: Not tested   Thin Liquid Thin Liquid: Within functional limits Presentation: Straw;Self Fed (~5-6 sips)    Nectar Thick Nectar Thick Liquid: Not tested   Honey Thick Honey Thick Liquid: Not tested   Puree Puree: Within functional limits Presentation: Self Fed;Spoon (~4 bites)   Solid     Solid: Within functional limits Presentation: Self Fed (~4 bites)     Randall Hiss Graduate Clinician [place], Speech Pathology   Randall Hiss 09/22/2022,10:25 AM

## 2022-09-22 NOTE — Progress Notes (Signed)
*  PRELIMINARY RESULTS* Echocardiogram 2D Echocardiogram has been performed.  Gabriela [name] 09/22/2022, 3:24 PM

## 2022-09-22 NOTE — Consult Note (Addendum)
NEURO HOSPITALIST CONSULT NOTE   Requesting physician: Dr. Posey Pronto  Reason for Consult: 3 day history of weakness, confusion, slurred speech and dropping things  History obtained from:   Patient and Chart     HPI:                                                                                                                                          Gabriela [name] is an 89 y.o. female with a PMHx of arthritis, DM, glaucoma, CKD, HLD, HTN, CAD s/p 3-vessel CABG, mitral valve repair and SOB with exertion who presents with a 3 day history of weakness, confusion, slurred speech and dropping things. Her home medications include ASA.   The patient is a poor historian. Additional history from the Hospitalist admission H and P has been reviewed: "89 y.o. female with medical history significant for HTN, CAD, CKD 3a, DM, MGUS who presents to the ED by EMS with concerns for 3-day history of weakness, confusion, slurred speech and dropping things.  Most of the history is given by the granddaughter at the bedside who stated that she noticed that her grandmother has increasingly worsening swelling of the bilateral lower extremities up to the thighs in spite of taking her lasix 10 mg daily.  She has no prior history of CHF and states she was prescribed Lasix by her PCP.  She took her to the nephrologist on 2/1 changed to Lasix to torsemide and ordered compression socks.  She stated that since the medication was changed, her grandmother continue to have swelling and seem to be weaker and she also developed a cough that has become increasingly more congested.  She is not short of breath but at baseline she does not move around a lot.  She has had no fever or chills, vomiting or diarrhea.  She denies chest pain. ED course and data review: Afebrile with unremarkable vitals. Labs significant for troponin of 60 and BNP 4115.  WBC 11,500 with hemoglobin 8.8, platelets 51,000.  Creatinine 1.57 above  baseline of 0.95 a month ago respiratory viral panel positive for COVID.  EKG, personally viewed and interpreted showing sinus at 77 with T wave inversion in lateral leads.  Chest x-ray nonacute and shows atrophy and chronic small vessel ischemic changes.  Chest x-ray showing bilateral pleural effusions with basilar atelectasis."     Past Medical History:  Diagnosis Date   Arthritis    Diabetes mellitus ([place])    Glaucoma    Hyperlipidemia    Hypertension    Shortness of breath    with exertion    Past Surgical History:  Procedure Laterality Date   CARDIAC CATHETERIZATION  04/22/13   CHOLECYSTECTOMY     CORONARY ARTERY BYPASS GRAFT N/A 05/31/2013   Procedure: Coronary  Artery Bypass Grafting Times Three Using Left Internal Mammary Artery and Right Saphenous Leg Vein Harvested Endoscopically;  Surgeon: Ivin Poot, MD;  Location: [place];  Service: Open Heart Surgery;  Laterality: N/A;   ERCP N/A 10/20/2020   Procedure: ENDOSCOPIC RETROGRADE CHOLANGIOPANCREATOGRAPHY (ERCP);  Surgeon: Lucilla Lame, MD;  Location: [facility] ENDOSCOPY;  Service: Endoscopy;  Laterality: N/A;   ERCP N/A 12/22/2020   Procedure: ENDOSCOPIC RETROGRADE CHOLANGIOPANCREATOGRAPHY (ERCP);  Surgeon: Lucilla Lame, MD;  Location: [facility] ENDOSCOPY;  Service: Endoscopy;  Laterality: N/A;   EYE SURGERY     HIP SURGERY     right   INTRAOPERATIVE TRANSESOPHAGEAL ECHOCARDIOGRAM N/A 05/31/2013   Procedure: INTRAOPERATIVE TRANSESOPHAGEAL ECHOCARDIOGRAM;  Surgeon: Ivin Poot, MD;  Location: [place];  Service: Open Heart Surgery;  Laterality: N/A;   JOINT REPLACEMENT     MITRAL VALVE REPAIR N/A 05/31/2013   Procedure: MITRAL VALVE REPAIR (MVR);  Surgeon: Ivin Poot, MD;  Location: [place];  Service: Open Heart Surgery;  Laterality: N/A;   ORIF FEMUR FRACTURE Bilateral 05/26/2022   Procedure: OPEN REDUCTION INTERNAL FIXATION (ORIF) DISTAL FEMUR FRACTURE;  Surgeon: Altamese Buffalo, MD;  Location: [place];  Service: Orthopedics;   Laterality: Bilateral;   TONSILLECTOMY      Family History  Problem Relation Age of Onset   Diabetes Sister    Hypertension Mother              Social History:  reports that she has never smoked. She has never used smokeless tobacco. She reports that she does not drink alcohol and does not use drugs.  Allergies  Allergen Reactions   Sulfa Antibiotics Rash    MEDICATIONS:                                                                                                                     Prior to Admission:  Medications Prior to Admission  Medication Sig Dispense Refill Last Dose   acetaminophen (TYLENOL) 325 MG tablet Take 2 tablets (650 mg total) by mouth 3 (three) times daily. Wean as able 200 tablet 0    albuterol (VENTOLIN HFA) 108 (90 Base) MCG/ACT inhaler Inhale 2 puffs into the lungs every 4 (four) hours as needed for wheezing or shortness of breath. 8 g 6    aspirin EC 81 MG tablet Take 1 tablet (81 mg total) by mouth daily. 100 tablet 0    calcium carbonate (TUMS - DOSED IN MG ELEMENTAL CALCIUM) 500 MG chewable tablet Chew 2 tablets (400 mg of elemental calcium total) by mouth daily. 100 tablet 0    diclofenac Sodium (VOLTAREN) 1 % GEL Apply 2 g topically 4 (four) times daily. 400 g 0    ferrous sulfate 325 (65 FE) MG tablet Take 1 tablet (325 mg total) by mouth daily with breakfast. 90 tablet 2    furosemide (LASIX) 20 MG tablet Take 1 tablet (20 mg total) by mouth as needed. Leg Swelling M79.89 30 tablet 0    ipratropium (ATROVENT HFA)  17 MCG/ACT inhaler Inhale 2 puffs into the lungs every 4 (four) hours as needed for wheezing. 1 each 12    ipratropium-albuterol (DUONEB) 0.5-2.5 (3) MG/3ML SOLN Take 3 mLs by nebulization every 4 (four) hours as needed. 360 mL 4    loratadine (CLARITIN) 10 MG tablet TAKE 1 TABLET BY MOUTH EVERY DAY 90 tablet 1    methocarbamol (ROBAXIN) 500 MG tablet Take 1 tablet (500 mg total) by mouth every 6 (six) hours as needed for muscle spasms. 30  tablet 0    metoprolol succinate (TOPROL-XL) 25 MG 24 hr tablet Take 0.5 tablets (12.5 mg total) by mouth daily. 90 tablet 1    Multiple Vitamin (MULTIVITAMIN WITH MINERALS) TABS tablet Take 1 tablet by mouth daily.      polyethylene glycol powder (GLYCOLAX/MIRALAX) 17 GM/SCOOP powder Take 17 g by mouth daily. 238 g 0    potassium chloride (KLOR-CON M) 10 MEQ tablet Take 2 tablets (20 mEq total) by mouth daily. 120 tablet 1    SIMBRINZA 1-0.2 % SUSP Place 1 drop into both eyes 2 (two) times daily.      sodium bicarbonate 650 MG tablet Take 1 tablet (650 mg total) by mouth 2 (two) times daily. 120 tablet 0    timolol (TIMOPTIC) 0.5 % ophthalmic solution Place 1 drop into both eyes 2 (two) times daily.      traMADol (ULTRAM) 50 MG tablet Take 1 tablet (50 mg total) by mouth every 8 (eight) hours as needed. 30 tablet 0    Vitamin D, Ergocalciferol, (DRISDOL) 1.25 MG (50000 UNIT) CAPS capsule Take 1 capsule (50,000 Units total) by mouth every 7 (seven) days. 5 capsule 0    Scheduled:  aspirin EC  81 mg Oral Daily   brinzolamide  1 drop Both Eyes BID   And   brimonidine  1 drop Both Eyes BID   ferrous sulfate  325 mg Oral Q breakfast   furosemide  40 mg Intravenous Q12H   insulin aspart  0-5 Units Subcutaneous QHS   insulin aspart  0-9 Units Subcutaneous TID WC   metoprolol succinate  12.5 mg Oral Daily   potassium chloride  20 mEq Oral Daily   timolol  1 drop Both Eyes BID     ROS:                                                                                                                                       Denies any headache or other pain. Other ROS as per HPI. Unable to obtain further detailed ROS due to patient being a poor historian.    Blood pressure 136/74, pulse 75, temperature 98 F (36.7 C), temperature source Oral, resp. rate 15, weight 60.5 kg, SpO2 99 %.   General Examination:  Physical Exam  HEENT-  [place]/AT   Lungs- Respirations unlabored. Frequent wet cough is noted.  Extremities- Warm and well perfused.   Neurological Examination Mental Status: Awake and alert. Speech is mildly dysarthric in the context of being partially edentulous. Oriented to the city, state and month, but not the day of the week or the year. Poor memory with decreased recollection of the symptoms precipitating her admission. Speech is sparse but fluent, with intact comprehension for all commands and basic questions. Able to name a thumb and a pinky finger, but not a forefinger.  Cranial Nerves: II: Temporal visual fields intact with no extinction to DSS. Right pupil 2 mm and reactive. Left pupil 4 mm with sluggish to no reactivity. Patient endorses remote left eye damage.   III,IV, VI: EOMI. No nystagmus. No ptosis.  V: Temp sensation subjectively normal bilaterally VII: Smile symmetric VIII: Hearing intact to voice IX,X: No hypophonia or hoarseness XI: Symmetric shoulder shrug XII: Midline tongue extension Motor: BUE 4/5 proximally and distally in the context of decreased muscle bulk symmetrically. Tone is normal.  RLE 2/5 hip flexion and knee flexion; 3/5 knee extension. Mildly increased tone.  LLE 2/5 hip flexion and knee flexion, 4-/5 knee extension. Mildly increased tone Sensory: Temp sensation with patchy decrease on the left. FT intact x 4 with no extinction to DSS.  Deep Tendon Reflexes: 1+ bilateral brachioradialis. Trace patellar reflexes bilaterally. Toes equivocal.  Cerebellar: No ataxia with FNF bilaterally. No tremor or asterixis. Unable to assess BLE due to weakness.  Gait: Unable to assess   Lab Results: Basic Metabolic Panel: Recent Labs  Lab 09/21/22 2033 09/22/22 0255  NA 142 143  K 4.2 4.1  CL 112* 113*  CO2 22 20*  GLUCOSE 140* 118*  BUN 31* 31*  CREATININE 1.57* 1.51*  CALCIUM 8.0* 7.7*    CBC: Recent Labs  Lab 09/21/22 2033 09/22/22 0255   WBC 11.5* 9.9  NEUTROABS 7.3  --   HGB 8.8* 8.9*  HCT 29.5* 29.3*  MCV 83.3 82.3  PLT 51* 44*    Cardiac Enzymes: No results for input(s): "CKTOTAL", "CKMB", "CKMBINDEX", "TROPONINI" in the last 168 hours.  Lipid Panel: No results for input(s): "CHOL", "TRIG", "HDL", "CHOLHDL", "VLDL", "LDLCALC" in the last 168 hours.  Imaging: ECHOCARDIOGRAM COMPLETE  Result Date: 09/22/2022    ECHOCARDIOGRAM REPORT   Patient Name:   Gabriela [name] Date of Exam: 09/22/2022 Medical Rec #:  [id number]        Height:       64.0 in Accession #:    [id number]       Weight:       133.4 lb Date of Birth:  [birth year]        BSA:          1.647 m Patient Age:    84 years         BP:           147/67 mmHg Patient Gender: F                HR:           76 bpm. Exam Location:  [facility] Procedure: 2D Echo, Cardiac Doppler and Color Doppler Indications:     CHF  History:         Patient has prior history of Echocardiogram examinations, most                  recent 05/28/2013. CHF, CAD, Prior CABG, COPD,  Signs/Symptoms:Murmur; Risk Factors:Hypertension, Diabetes and                  Dyslipidemia. Bilateral plerual effusion, COVID +.  Sonographer:     Wenda Low Referring Phys:  [id number] Athena Masse Diagnosing Phys: Ida Rogue MD IMPRESSIONS  1. Left ventricular ejection fraction, by estimation, is 45 to 50%. The left ventricle has mildly decreased function. The left ventricle demonstrates global hypokinesis. There is mild left ventricular hypertrophy. Left ventricular diastolic parameters are consistent with Grade II diastolic dysfunction (pseudonormalization). There is the interventricular septum is flattened in systole, consistent with right ventricular pressure overload.  2. Right ventricular systolic function is severely reduced. The right ventricular size is normal. Mildly increased right ventricular wall thickness. There is moderately elevated pulmonary artery systolic pressure. The estimated right  ventricular systolic pressure is 123XX123 mmHg.  3. Left atrial size was mildly dilated.  4. Moderate pleural effusion in the left lateral region.  5. The mitral valve is normal in structure. Moderate mitral valve regurgitation. No evidence of mitral stenosis. Moderate mitral annular calcification.  6. Tricuspid valve regurgitation is moderate to severe.  7. The aortic valve is tricuspid. Aortic valve regurgitation is mild. Aortic valve sclerosis is present, with no evidence of aortic valve stenosis.  8. The inferior vena cava is dilated in size with >50% respiratory variability, suggesting right atrial pressure of 8 mmHg. FINDINGS  Left Ventricle: Left ventricular ejection fraction, by estimation, is 45 to 50%. The left ventricle has mildly decreased function. The left ventricle demonstrates global hypokinesis. The left ventricular internal cavity size was normal in size. There is  mild left ventricular hypertrophy. The interventricular septum is flattened in systole, consistent with right ventricular pressure overload. Left ventricular diastolic parameters are consistent with Grade II diastolic dysfunction (pseudonormalization). Right Ventricle: The right ventricular size is normal. Mildly increased right ventricular wall thickness. Right ventricular systolic function is severely reduced. There is moderately elevated pulmonary artery systolic pressure. The tricuspid regurgitant velocity is 3.08 m/s, and with an assumed right atrial pressure of 8 mmHg, the estimated right ventricular systolic pressure is 123XX123 mmHg. Left Atrium: Left atrial size was mildly dilated. Right Atrium: Right atrial size was normal in size. Pericardium: There is no evidence of pericardial effusion. Mitral Valve: The mitral valve is normal in structure. There is mild thickening of the mitral valve leaflet(s). Moderate mitral annular calcification. Moderate mitral valve regurgitation. No evidence of mitral valve stenosis. MV peak gradient, 10.4  mmHg.  The mean mitral valve gradient is 5.0 mmHg. Tricuspid Valve: The tricuspid valve is normal in structure. Tricuspid valve regurgitation is moderate to severe. No evidence of tricuspid stenosis. Aortic Valve: The aortic valve is tricuspid. Aortic valve regurgitation is mild. Aortic valve sclerosis is present, with no evidence of aortic valve stenosis. Aortic valve mean gradient measures 4.0 mmHg. Aortic valve peak gradient measures 7.0 mmHg. Aortic valve area, by VTI measures 2.03 cm. Pulmonic Valve: The pulmonic valve was normal in structure. Pulmonic valve regurgitation is mild. No evidence of pulmonic stenosis. Aorta: The aortic root is normal in size and structure. Venous: The inferior vena cava is dilated in size with greater than 50% respiratory variability, suggesting right atrial pressure of 8 mmHg. IAS/Shunts: No atrial level shunt detected by color flow Doppler. Additional Comments: There is a moderate pleural effusion in the left lateral region.  LEFT VENTRICLE PLAX 2D LVIDd:         4.30 cm   Diastology LVIDs:  3.50 cm   LV e' medial:    9.90 cm/s LV PW:         0.80 cm   LV E/e' medial:  14.2 LV IVS:        1.10 cm   LV e' lateral:   6.53 cm/s LVOT diam:     2.00 cm   LV E/e' lateral: 21.6 LV SV:         58 LV SV Index:   35 LVOT Area:     3.14 cm  RIGHT VENTRICLE RV Basal diam:  4.20 cm RV Mid diam:    3.60 cm LEFT ATRIUM             Index        RIGHT ATRIUM           Index LA diam:        4.30 cm 2.61 cm/m   RA Area:     18.60 cm LA Vol (A2C):   76.0 ml 46.14 ml/m  RA Volume:   55.00 ml  33.39 ml/m LA Vol (A4C):   48.2 ml 29.26 ml/m LA Biplane Vol: 61.4 ml 37.28 ml/m  AORTIC VALVE                    PULMONIC VALVE AV Area (Vmax):    2.07 cm     PV Vmax:       0.83 m/s AV Area (Vmean):   1.79 cm     PV Peak grad:  2.8 mmHg AV Area (VTI):     2.03 cm AV Vmax:           132.00 cm/s AV Vmean:          90.400 cm/s AV VTI:            0.285 m AV Peak Grad:      7.0 mmHg AV Mean Grad:       4.0 mmHg LVOT Vmax:         87.00 cm/s LVOT Vmean:        51.400 cm/s LVOT VTI:          0.184 m LVOT/AV VTI ratio: 0.65  AORTA Ao Root diam: 2.90 cm Ao Asc diam:  2.70 cm MITRAL VALVE                TRICUSPID VALVE MV Area (PHT): 2.66 cm     TR Peak grad:   37.9 mmHg MV Area VTI:   1.35 cm     TR Vmax:        308.00 cm/s MV Peak grad:  10.4 mmHg MV Mean grad:  5.0 mmHg     SHUNTS MV Vmax:       1.62 m/s     Systemic VTI:  0.18 m MV Vmean:      102.0 cm/s   Systemic Diam: 2.00 cm MV Decel Time: 285 msec MV E velocity: 141.00 cm/s MV A velocity: 98.00 cm/s MV E/A ratio:  1.44 Ida Rogue MD Electronically signed by Ida Rogue MD Signature Date/Time: 09/22/2022/5:21:19 PM    Final    MR BRAIN WO CONTRAST  Result Date: 09/21/2022 CLINICAL DATA:  Initial evaluation for neuro deficit, stroke suspected. EXAM: MRI HEAD WITHOUT CONTRAST TECHNIQUE: Multiplanar, multiecho pulse sequences of the brain and surrounding structures were obtained without intravenous contrast. COMPARISON:  CT from earlier the same day. FINDINGS: Brain: Generalized age-related cerebral atrophy. Patchy and confluent T2/FLAIR hyperintensity involving the periventricular white matter, most  characteristic of chronic microvascular ischemic disease, mild-to-moderate in nature. 7 mm focus of restricted diffusion involving the right cerebellum, consistent with a small acute ischemic nonhemorrhagic infarct (series 5, image 13). No associated mass effect. No other evidence for acute or subacute ischemia. Gray-white matter differentiation otherwise maintained. No acute intracranial hemorrhage. Few punctate chronic micro hemorrhages noted within the right cerebral hemisphere, likely small vessel related. No mass lesion, midline shift or mass effect no hydrocephalus or extra-axial fluid collection. Pituitary gland and suprasellar region within normal limits. Vascular: Major intracranial vascular flow voids are maintained. Skull and upper cervical  spine: Craniocervical junction within normal limits. Bone marrow signal intensity grossly within normal limits. No scalp soft tissue abnormality. Sinuses/Orbits: Prior bilateral ocular lens replacement. Chronic mucosal thickening present about the ethmoidal air cells and maxillary sinuses. No mastoid effusion. Other: None. IMPRESSION: 1. 7 mm acute ischemic nonhemorrhagic right cerebellar infarct. 2. Underlying age-related cerebral atrophy with mild to moderate chronic microvascular ischemic disease. Electronically Signed   By: Jeannine Boga M.D.   On: 09/21/2022 23:32   CT HEAD WO CONTRAST  Result Date: 09/21/2022 CLINICAL DATA:  Neuro deficit, acute, stroke suspected. Slurred speech. Weakness. EXAM: CT HEAD WITHOUT CONTRAST TECHNIQUE: Contiguous axial images were obtained from the base of the skull through the vertex without intravenous contrast. RADIATION DOSE REDUCTION: This exam was performed according to the departmental dose-optimization program which includes automated exposure control, adjustment of the mA and/or kV according to patient size and/or use of iterative reconstruction technique. COMPARISON:  02/10/2021 FINDINGS: Brain: Age related atrophy. Chronic small-vessel ischemic changes of the hemispheric white matter. No sign of acute infarction, mass lesion, hemorrhage, hydrocephalus or extra-axial collection. Vascular: There is atherosclerotic calcification of the major vessels at the base of the brain. Skull: Negative Sinuses/Orbits: Clear/normal Other: None IMPRESSION: No acute CT finding. Age related atrophy and chronic small-vessel ischemic changes of the white matter. Electronically Signed   By: Nelson Chimes M.D.   On: 09/21/2022 19:57   DG Chest 2 View  Result Date: 09/21/2022 CLINICAL DATA:  Cough. Weakness. Altered mental status. History of diabetes, hypertension, coronary bypass. EXAM: CHEST - 2 VIEW COMPARISON:  06/26/2022 FINDINGS: Postoperative changes in the mediastinum. Mild  cardiac enlargement. No vascular congestion. Moderate bilateral pleural effusions with basilar atelectasis. No pneumothorax. Mediastinal contours appear intact. Calcification of the aorta. Degenerative changes in the spine and shoulders. IMPRESSION: Bilateral pleural effusions with basilar atelectasis. Electronically Signed   By: Lucienne Capers M.D.   On: 09/21/2022 19:56     Assessment: 89 year old Covid+ female presenting with a 3 day history of weakness, confusion, slurred speech and dropping things.  - Exam reveals bilateral lower extremity weakness, right worse than left, severe enough to be associated with inability to ambulate, which she acknowledges has been present since a fall. She has asymmetric pupils due to chronic left eye damage. Poor memory is also noted. Upper extremities appear equally mildly weak due to deconditioning.  - CT head with no acute finding. Age related atrophy and chronic small-vessel ischemic changes of the white matter are present.  - MRI brain: There is a 7 mm acute ischemic nonhemorrhagic right cerebellar infarct. Underlying age-related cerebral atrophy with mild to moderate chronic microvascular ischemic disease is also noted - TTE:  LVEF 45 to 50%. The left ventricle has mildly decreased function and demonstrates global hypokinesis. There is mild left ventricular hypertrophy. Left ventricular diastolic parameters are consistent with Grade II diastolic dysfunction (pseudonormalization). The interventricular septum is  flattened in systole, consistent with right ventricular pressure overload. Right ventricular systolic function is severely reduced. Moderately elevated pulmonary artery systolic pressure. Left atrial size was mildly dilated. Moderate pleural effusion in the left lateral region.  The mitral valve is normal in structure; moderate mitral valve regurgitation; no evidence of mitral stenosis. Moderate mitral annular calcification. Tricuspid valve regurgitation is  moderate to severe. Aortic valve regurgitation is mild. Aortic valve sclerosis is present, with no evidence of aortic valve stenosis.   - Labs: - Elevated BUN and Cr with eGFR of 32 - AST and ALT are normal.  - Total Ca is low in the context of low albumin level.  - BNP markedly elevated at 4115. Troponin also elevated.  - WBC normal. She is anemic. Also with thrombocytopenia.  - Classifiable as having failed ASA monotherapy.  - Etiology of her stroke may be cardioembolic given global hypokinesis of LV seen on TTE. Also on the DDx for mechanism would be chronic age-related or hypertensive microangiopathy, the risk of latter being increased by her diabetes.    Recommendations: - HgbA1c, fasting lipid panel - MRA of head (ordered) - Carotid ultrasound (ordered) - PT consult, OT consult, Speech consult - Add Plavix to ASA unless there is a high falls or bleeding risk.  - Given her age, benefits of statin most likely are outweighed by the risks, which include liver damage and myopathy - Telemetry monitoring - Frequent neuro checks  Addendum: - MRA head: No large vessel occlusion or hemodynamically significant stenosis in the head. - Carotid ultrasound: Atherosclerotic plaque involving bilateral carotid arteries. Estimated degree of stenosis in the internal carotid arteries is less than 50% bilaterally. Patent vertebral arteries with antegrade flow. - Given her thrombocytopenia, DAPT is felt to carry with it too high of a bleeding risk. She has been switched from ASA to Plavix monotherapy which should confer a slightly higher protective effect than ASA regarding stroke prevention. Will need to continue monitoring her platelet levels inpatient as well as at outpatient follow ups.  - Neurohospitalist service will sign off. Please call if there are additional questions.     Electronically signed: Dr. Kerney Elbe 09/22/2022, 7:28 PM

## 2022-09-22 NOTE — Progress Notes (Signed)
Remdesivir - Pharmacy Brief Note   A/P:  Remdesivir 200 mg IVPB once followed by 100 mg IVPB daily x 2 days.   Renda Rolls, PharmD, MBA 09/22/2022 1:23 AM

## 2022-09-22 NOTE — TOC Initial Note (Signed)
Transition of Care [facility]) - Initial/Assessment Note    Patient Details  Name: Gabriela [name] MRN: [id number] Date of Birth: Aug 11, 1926  Transition of Care [facility]) CM/SW Contact:    Tiburcio Bash, LCSW Phone Number: 09/22/2022, 9:18 AM  Clinical Narrative:                  Patient with recent readmissions per chart review patient is bedbound, She has hx of Daniel with [facility].   ROHO cushion , WC, Slide Board, Hoyer, HC and drop arm commode was delivered to the home by [employer] on past admissions  TOC will follow for additional discharge planning needs.     Expected Discharge Plan: [place] Barriers to Discharge: Continued Medical Work up   Patient Goals and CMS Choice Patient states their goals for this hospitalization and ongoing recovery are:: to go home CMS Medicare.gov Compare Post Acute Care list provided to:: Patient Choice offered to / list presented to : Patient      Expected Discharge Plan and Services       Living arrangements for the past 2 months: [place]: [place]        Prior Living Arrangements/Services Living arrangements for the past 2 months: [place] Lives with:: Relatives                   Activities of Daily Living      Permission Sought/Granted                  Emotional Assessment              Admission diagnosis:  Slurred speech [R47.81] Pleural effusion [J90] Acute CHF (congestive heart failure) ([place]) [I50.9] AKI (acute kidney injury) ([place]) [N17.9] COVID-19 [U07.1] Patient Active Problem List   Diagnosis Date Noted   Acute clinical systolic heart failure ([place]) 09/21/2022   Hx of CABG 09/21/2022   Bilateral pleural effusion 09/21/2022   COVID-19 virus infection 09/21/2022   Generalized weakness 09/21/2022   Elevated troponin 09/21/2022   COPD exacerbation ([place]) 09/12/2022   Leg swelling 08/10/2022   Type 2 diabetes  mellitus with circulatory disorder ([place]) 08/10/2022   Wound infection 07/01/2022   Pneumonia of both lower lobes due to infectious organism 06/28/2022   Atelectasis 06/28/2022   Acute bronchitis 06/28/2022   Labored breathing 06/23/2022   Sacral wound 06/23/2022   Pressure injury of right foot, stage 1 06/23/2022   Pressure injury of skin of ischial area 06/21/2022   Pressure ulcer with suspected deep tissue injury, unstageable ([place]) 06/21/2022   Oth fracture of unsp femur, init encntr for closed fracture ([place]) 06/02/2022   Fever 05/29/2022   Leukocytosis 05/29/2022   Femur fracture, right ([place]) 05/26/2022   Femur fracture, left ([place]) 05/26/2022   CKD (chronic kidney disease) stage 4, GFR 15-29 ml/min (HCC) 05/26/2022   Comminuted fracture of right hip ([place]) 05/25/2022   Hypomagnesemia 37/62/8315   Acute metabolic encephalopathy 17/61/6073   Thrombocytopenia ([place]) 04/16/2022   Anemia of chronic disease 04/16/2022   Acute renal failure superimposed on stage 3a chronic kidney disease ([place]) 04/16/2022   MGUS (monoclonal gammopathy of unknown significance) 04/16/2022   Acute right hip pain 04/16/2022   Right hip pain 04/15/2022   Chronic wound 04/15/2022   Encounter  for adjustment or removal of myringotomy device (stent) (tube)    Choledocholithiasis    Common bile duct dilatation    Abnormal findings on imaging of biliary tract    History of revision of total replacement of right hip joint 01/20/2016   Abnormal LFTs (liver function tests) 04/17/2015   Anemia of diabetes 04/17/2015   Atherosclerosis of coronary artery 04/17/2015   Diabetic kidney ([place]) 04/17/2015   Essential (primary) hypertension 04/17/2015   Glaucoma 04/17/2015   HLD (hyperlipidemia) 04/17/2015   Disorder of kidney 04/17/2015   Arthritis, degenerative 04/17/2015   Arthritis, hip 04/26/2013   CAD with hx of CABG(coronary artery disease) 04/26/2013   Ischemic mitral regurgitation 04/26/2013   Type 2 diabetes  mellitus with renal manifestations ([place])    Hyperlipidemia    PCP:  [name], MD Pharmacy:   [id number] [place] [place] [address] [place]Phone: [phone]: [phone] [id number] [place]one: [phone]: [id number] [place]one: [phone]: [id number] [place] [facility] [place]one: [phone]: [phone] [address] [place]one: [phone]: [phone]  Social Determinants of Health (SDOH) Social History: [place] Food Insecurity (07/01/2022)  Housing: Low Risk  (07/01/2022)  Transportation Needs: No Transportation Needs (07/01/2022)  Utilities: Not At Risk (07/01/2022)  Alcohol Screen: Low Risk  (09/12/2022)  Depression (PHQ2-9): Low Risk  (09/12/2022)  Financial Resource Strain: Low Risk  (09/07/2021)  Physical Activity: Inactive (09/07/2021)  Social Connections: Moderately Isolated (09/07/2021)  Stress: No Stress Concern Present (09/07/2021)  Tobacco Use: Low Risk  (09/21/2022)   SDOH Interventions:     Readmission Risk Interventions     No data to display

## 2022-09-22 NOTE — Plan of Care (Signed)
  Problem: Education: Goal: Ability to demonstrate management of disease process will improve Outcome: Progressing Goal: Ability to verbalize understanding of medication therapies will improve Outcome: Progressing Goal: Individualized Educational Video(s) Outcome: Progressing   Problem: Activity: Goal: Capacity to carry out activities will improve Outcome: Progressing   Problem: Cardiac: Goal: Ability to achieve and maintain adequate cardiopulmonary perfusion will improve Outcome: Progressing   Problem: Education: Goal: Knowledge of risk factors and measures for prevention of condition will improve Outcome: Progressing   Problem: Coping: Goal: Psychosocial and spiritual needs will be supported Outcome: Progressing   Problem: Respiratory: Goal: Will maintain a patent airway Outcome: Progressing Goal: Complications related to the disease process, condition or treatment will be avoided or minimized Outcome: Progressing   Problem: Education: Goal: Knowledge of General Education information will improve Description: Including pain rating scale, medication(s)/side effects and non-pharmacologic comfort measures Outcome: Progressing   Problem: Health Behavior/Discharge Planning: Goal: Ability to manage health-related needs will improve Outcome: Progressing   Problem: Clinical Measurements: Goal: Ability to maintain clinical measurements within normal limits will improve Outcome: Progressing Goal: Will remain free from infection Outcome: Progressing Goal: Diagnostic test results will improve Outcome: Progressing Goal: Respiratory complications will improve Outcome: Progressing Goal: Cardiovascular complication will be avoided Outcome: Progressing   Problem: Activity: Goal: Risk for activity intolerance will decrease Outcome: Progressing   Problem: Nutrition: Goal: Adequate nutrition will be maintained Outcome: Progressing   Problem: Coping: Goal: Level of anxiety  will decrease Outcome: Progressing   Problem: Elimination: Goal: Will not experience complications related to bowel motility Outcome: Progressing Goal: Will not experience complications related to urinary retention Outcome: Progressing   Problem: Pain Managment: Goal: General experience of comfort will improve Outcome: Progressing   Problem: Safety: Goal: Ability to remain free from injury will improve Outcome: Progressing   Problem: Skin Integrity: Goal: Risk for impaired skin integrity will decrease Outcome: Progressing

## 2022-09-22 NOTE — IPAL (Addendum)
  Interdisciplinary Goals of Care Family Meeting   Date carried out: 09/21/22  Location of the meeting: Bedside  Member's involved: Physician and Family Member or next of kin, Granddaughter Ethlyn Daniels Power of Attorney or acting medical decision maker: [name] roberts    Discussion: We discussed goals of care for [employer] .  I have reviewed medical records including EPIC notes, labs and imaging, assessed the patient and then met with ptient and granddaughter to discuss major active diagnoses, plan of care, natural trajectory, prognosis, GOC, EOL wishes, disposition and options including Full code/DNI/DNR and the concept of comfort care if DNR is elected. Questions and concerns were addressed. They are  in agreement to continue current plan of care . Election for DNR status.   Code status:   Code Status: DNR   Disposition: Continue current acute care  Time spent for the meeting: [place], MD  09/22/2022, 9:38 AM

## 2022-09-22 NOTE — Progress Notes (Signed)
[name] at [place] NAME: Gabriela [name]    MR#:  [id number]  DATE OF BIRTH:  [birth year]  SUBJECTIVE:  patient has dementia at baseline. She is however pleasantly confused. Denies any chest pain or shortness of breath. Tells me she is in the hospital. No family at bedside earlier. Per RN no other issues. She had some weakness    VITALS:  Blood pressure (!) 147/67, pulse 73, temperature 97.6 F (36.4 C), temperature source Oral, resp. rate 17, weight 60.5 kg, SpO2 95 %.  PHYSICAL EXAMINATION:   GENERAL:  89 y.o.-year-old patient with no acute distress.  LUNGS: decreased breath sounds bilaterally, no wheezing CARDIOVASCULAR: S1, S2 normal. No murmur   ABDOMEN: Soft, nontender, nondistended. Bowel sounds present.  EXTREMITIES:   ++ edema b/l.    NEUROLOGIC: nonfocal  patient is alert and awake,no focal weakness noted. Speech clear SKIN: No obvious rash, lesion, or ulcer.   LABORATORY PANEL:  CBC Recent Labs  Lab 09/22/22 0255  WBC 9.9  HGB 8.9*  HCT 29.3*  PLT 44*    Chemistries  Recent Labs  Lab 09/21/22 2033 09/22/22 0255  NA 142 143  K 4.2 4.1  CL 112* 113*  CO2 22 20*  GLUCOSE 140* 118*  BUN 31* 31*  CREATININE 1.57* 1.51*  CALCIUM 8.0* 7.7*  AST 22  --   ALT 15  --   ALKPHOS 131*  --   BILITOT 1.5*  --    Cardiac Enzymes No results for input(s): "TROPONINI" in the last 168 hours. RADIOLOGY:  MR BRAIN WO CONTRAST  Result Date: 09/21/2022 CLINICAL DATA:  Initial evaluation for neuro deficit, stroke suspected. EXAM: MRI HEAD WITHOUT CONTRAST TECHNIQUE: Multiplanar, multiecho pulse sequences of the brain and surrounding structures were obtained without intravenous contrast. COMPARISON:  CT from earlier the same day. FINDINGS: Brain: Generalized age-related cerebral atrophy. Patchy and confluent T2/FLAIR hyperintensity involving the periventricular white matter, most characteristic of chronic microvascular ischemic  disease, mild-to-moderate in nature. 7 mm focus of restricted diffusion involving the right cerebellum, consistent with a small acute ischemic nonhemorrhagic infarct (series 5, image 13). No associated mass effect. No other evidence for acute or subacute ischemia. Gray-white matter differentiation otherwise maintained. No acute intracranial hemorrhage. Few punctate chronic micro hemorrhages noted within the right cerebral hemisphere, likely small vessel related. No mass lesion, midline shift or mass effect no hydrocephalus or extra-axial fluid collection. Pituitary gland and suprasellar region within normal limits. Vascular: Major intracranial vascular flow voids are maintained. Skull and upper cervical spine: Craniocervical junction within normal limits. Bone marrow signal intensity grossly within normal limits. No scalp soft tissue abnormality. Sinuses/Orbits: Prior bilateral ocular lens replacement. Chronic mucosal thickening present about the ethmoidal air cells and maxillary sinuses. No mastoid effusion. Other: None. IMPRESSION: 1. 7 mm acute ischemic nonhemorrhagic right cerebellar infarct. 2. Underlying age-related cerebral atrophy with mild to moderate chronic microvascular ischemic disease. Electronically Signed   By: Jeannine Boga M.D.   On: 09/21/2022 23:32   CT HEAD WO CONTRAST  Result Date: 09/21/2022 CLINICAL DATA:  Neuro deficit, acute, stroke suspected. Slurred speech. Weakness. EXAM: CT HEAD WITHOUT CONTRAST TECHNIQUE: Contiguous axial images were obtained from the base of the skull through the vertex without intravenous contrast. RADIATION DOSE REDUCTION: This exam was performed according to the departmental dose-optimization program which includes automated exposure control, adjustment of the mA and/or kV according to patient size and/or use of iterative reconstruction technique. COMPARISON:  02/10/2021 FINDINGS:  Brain: Age related atrophy. Chronic small-vessel ischemic changes of the  hemispheric white matter. No sign of acute infarction, mass lesion, hemorrhage, hydrocephalus or extra-axial collection. Vascular: There is atherosclerotic calcification of the major vessels at the base of the brain. Skull: Negative Sinuses/Orbits: Clear/normal Other: None IMPRESSION: No acute CT finding. Age related atrophy and chronic small-vessel ischemic changes of the white matter. Electronically Signed   By: Nelson Chimes M.D.   On: 09/21/2022 19:57   DG Chest 2 View  Result Date: 09/21/2022 CLINICAL DATA:  Cough. Weakness. Altered mental status. History of diabetes, hypertension, coronary bypass. EXAM: CHEST - 2 VIEW COMPARISON:  06/26/2022 FINDINGS: Postoperative changes in the mediastinum. Mild cardiac enlargement. No vascular congestion. Moderate bilateral pleural effusions with basilar atelectasis. No pneumothorax. Mediastinal contours appear intact. Calcification of the aorta. Degenerative changes in the spine and shoulders. IMPRESSION: Bilateral pleural effusions with basilar atelectasis. Electronically Signed   By: Lucienne Capers M.D.   On: 09/21/2022 19:56    Assessment and Plan  Gabriela [name] is a 89 y.o. female with medical history significant for HTN, CAD, CKD 3a, DM, MGUS who presents to the ED by EMS with concerns for 3-day history of weakness, confusion, slurred speech and dropping things.  Most of the history is given by the granddaughter at the bedside who stated that she noticed that her grandmother has increasingly worsening swelling of the bilateral lower extremities up to the thighs in spite of taking her lasix 10 mg daily.  Chest x-ray nonacute and shows atrophy and chronic small vessel ischemic changes.  Chest x-ray showing bilateral pleural effusions with basilar atelectasis   MRI brain  7 mm acute ischemic nonhemorrhagic right none ischemic infarct infarct. 2. Underlying age-related cerebral atrophy with mild to moderate chronic microvascular ischemic disease.  Acute  clinical systolic heart failure (HCC) Bilateral pleural effusion --Patient with several weeks of lower extremity edema, BNP over 4000 and chest x-ray showing bilateral pleural effusions --Suspect ischemic cardiomyopathy given history of CAD and CABG --Patient has a remote echo from 2014 that showed EF 40 to 45% --IV Lasix 20 mg bid, monitor input output and creatinine -Continue metoprolol --Daily weights with intake and output monitoring --Echocardiogram today -- consider cardiology consultation if needed. Patient does not follow anyone locally   CAD with hx of CABG(coronary artery disease) Elevated troponin, suspect demand ischemia --Troponin of 60, without chest pain or EKG changes --Continue to trend troponin --Continue aspirin, metoprolol --Follow echo to evaluate for regional wall motion abnormalities   COVID-19 virus infection asymptomatic Weakness, cough, confusion likely related to COVID and appears to have started about 5 days prior per report from granddaughter Daughter mentions recent exposure to family members who had COVID Supportive care with antitussives, albuterol as needed Airborne precautions no indication for antiviral   Acute metabolic encephalopathy acute right cerebellar non-ischemic infarct Confusion, weakness and slurred speech reported, however neuroexam was nonfocal CT head nonacute continue aspirin neurology consultation   Acute renal failure superimposed on stage 3a chronic kidney disease ([place]) Creatinine 1.57, above baseline of 0.95 a month prior Patient recently established care with nephrology on 1/29 Continue to monitor as patient will be on IV Lasix nephrology consultation if needed   Generalized weakness At baseline, patient is currently mostly bedbound/wheelchair-bound following a bilateral femoral fracture in November 2023 while working with PT Patient lives alone and family members are in and out discussed with patient's granddaughter  [name]-- patient will return home with resumption of home services.  Type 2 diabetes mellitus with circulatory disorder (HCC) Sliding scale insulin coverage   MGUS (monoclonal gammopathy of unknown significance) Anemia and thrombocytopenia Hemoglobin 8.8 around baseline and platelets 51,000 up from prior baseline in the 30,000s Patient was seen by hematology during an inpatient stay in November 2023 with likely diagnosis of MGUS but did not follow-up after discharge   Arthritis, degenerative Continue Tylenol and Robaxin with tramadol as needed   Essential (primary) hypertension Continue metoprolol     Procedures: Family communication : granddaughter Oncologist Consults : neurology CODE STATUS: DNR granddaughter DVT Prophylaxis : thrombocytopenia Level of care: Progressive Status is: Inpatient Remains inpatient appropriate because: CHF, CVA anticipate discharge 1 to 3 days  TOTAL TIME TAKING CARE OF THIS PATIENT: 35 minutes.  >50% time spent on counselling and coordination of care  Note: This dictation was prepared with Dragon dictation along with smaller phrase technology. Any transcriptional errors that result from this process are unintentional.  Fritzi Mandes M.D    [facility]   CC: Primary care physician; [name], MD

## 2022-09-23 ENCOUNTER — Inpatient Hospital Stay: Payer: 59

## 2022-09-23 DIAGNOSIS — D472 Monoclonal gammopathy: Secondary | ICD-10-CM

## 2022-09-23 DIAGNOSIS — E1151 Type 2 diabetes mellitus with diabetic peripheral angiopathy without gangrene: Secondary | ICD-10-CM

## 2022-09-23 DIAGNOSIS — G9341 Metabolic encephalopathy: Secondary | ICD-10-CM

## 2022-09-23 DIAGNOSIS — J90 Pleural effusion, not elsewhere classified: Secondary | ICD-10-CM

## 2022-09-23 DIAGNOSIS — N170 Acute kidney failure with tubular necrosis: Secondary | ICD-10-CM | POA: Diagnosis not present

## 2022-09-23 DIAGNOSIS — I10 Essential (primary) hypertension: Secondary | ICD-10-CM

## 2022-09-23 DIAGNOSIS — I639 Cerebral infarction, unspecified: Secondary | ICD-10-CM

## 2022-09-23 DIAGNOSIS — I5A Non-ischemic myocardial injury (non-traumatic): Secondary | ICD-10-CM | POA: Insufficient documentation

## 2022-09-23 DIAGNOSIS — I5022 Chronic systolic (congestive) heart failure: Secondary | ICD-10-CM

## 2022-09-23 DIAGNOSIS — N1831 Chronic kidney disease, stage 3a: Secondary | ICD-10-CM

## 2022-09-23 HISTORY — DX: Cerebral infarction, unspecified: I63.9

## 2022-09-23 LAB — GLUCOSE, CAPILLARY
Glucose-Capillary: 30 mg/dL — CL (ref 70–99)
Glucose-Capillary: 57 mg/dL — ABNORMAL LOW (ref 70–99)
Glucose-Capillary: 61 mg/dL — ABNORMAL LOW (ref 70–99)
Glucose-Capillary: 85 mg/dL (ref 70–99)
Glucose-Capillary: 30 mg/dL — CL (ref 70–99)
Glucose-Capillary: 140 mg/dL — ABNORMAL HIGH (ref 70–99)
Glucose-Capillary: 231 mg/dL — ABNORMAL HIGH (ref 70–99)

## 2022-09-23 LAB — GLUCOSE, RANDOM: Glucose, Bld: 132 mg/dL — ABNORMAL HIGH (ref 70–99)

## 2022-09-23 MED ORDER — SODIUM CHLORIDE 0.9% FLUSH: 10.0000 mL | INTRAVENOUS | Status: DC | PRN

## 2022-09-23 MED ORDER — DEXTROSE-NACL 5-0.9 % IV SOLN: INTRAVENOUS | Status: DC

## 2022-09-23 MED ORDER — SODIUM BICARBONATE 650 MG PO TABS
650.0000 mg | ORAL_TABLET | Freq: Two times a day (BID) | ORAL | Status: DC
  Administered 2022-09-23 – 2022-09-26 (×8): 650 mg via ORAL
  Filled 2022-09-23 (×8): qty 1

## 2022-09-23 MED ORDER — SODIUM CHLORIDE 0.9% FLUSH
10.0000 mL | Freq: Two times a day (BID) | INTRAVENOUS | Status: DC
  Administered 2022-09-23 – 2022-10-03 (×10): 10 mL

## 2022-09-23 MED ORDER — DEXTROSE 50 % IV SOLN: 1.0000 | Freq: Once | INTRAVENOUS | Status: AC

## 2022-09-23 MED ORDER — CLOPIDOGREL BISULFATE 75 MG PO TABS
75.0000 mg | ORAL_TABLET | Freq: Every day | ORAL | Status: DC
  Administered 2022-09-23 – 2022-10-03 (×11): 75 mg via ORAL
  Filled 2022-09-23 (×11): qty 1

## 2022-09-23 MED ORDER — DEXTROSE 50 % IV SOLN
INTRAVENOUS | Status: AC
  Administered 2022-09-23: 50 mL via INTRAVENOUS
  Filled 2022-09-23: qty 50

## 2022-09-23 NOTE — Assessment & Plan Note (Signed)
Troponin slightly elevated but we can get this with a stroke also.

## 2022-09-23 NOTE — Progress Notes (Signed)
Progress Note   [name] [id number] DOB: 30-Jul-1926 DOA: 09/21/2022     2 DOS: the patient was seen and examined on 09/23/2022   Brief hospital course: 89 y.o. female with medical history significant for HTN, CAD, CKD 3a, DM, MGUS who presents to the ED by EMS with concerns for 3-day history of weakness, confusion, slurred speech and dropping things.  Most of the history is given by the granddaughter at the bedside who stated that she noticed that her grandmother has increasingly worsening swelling of the bilateral lower extremities up to the thighs in spite of taking her lasix 10 mg daily.  She has no prior history of CHF and states she was prescribed Lasix by her PCP.  She took her to the nephrologist on 2/1 changed to Lasix to torsemide and ordered compression socks.  She stated that since the medication was changed, her grandmother continue to have swelling and seem to be weaker and she also developed a cough that has become increasingly more congested.  She is not short of breath but at baseline she does not move around a lot.  She has had no fever or chills, vomiting or diarrhea.  She denies chest pain. ED course and data review: Afebrile with unremarkable vitals. Labs significant for troponin of 60 and BNP 4115.  WBC 11,500 with hemoglobin 8.8, platelets 51,000.  Creatinine 1.57 above baseline of 0.95 a month ago respiratory viral panel positive for COVID.  EKG, personally viewed and interpreted showing sinus at 77 with T wave inversion in lateral leads.  Chest x-ray nonacute and shows atrophy and chronic small vessel ischemic changes.  Chest x-ray showing bilateral pleural effusions with basilar atelectasis. MRI ordered to evaluate for stroke.  Hospitalist consulted for admission.   MRI showed a 7 mm acute ischemic nonhemorrhagic right cerebral infarct.   2/9.  Called with low sugar.  Given an amp of D50 and started on IV fluids with D5 minute.  I do not think her fingersticks  are accurate secondary to her hands being cold and poor circulation.  Chemistries fingerstick this morning was 118 and 12 noon sugar was 132.  Will discontinue fingersticks and recheck with chemistry on a daily basis.    Assessment and Plan: * Acute stroke due to ischemia [facility]) Right cerebellar area.  With her low platelets I do not think we need to give dual antiplatelet agents at this point.  Case discussed with pharmacist earlier and will use Plavix.  Acute renal failure superimposed on stage 3a chronic kidney disease (HCC) Creatinine 1.57, above baseline of 0.95 a month prior.  Hold Lasix at this point and use gentle fluids.  Recheck creatinine tomorrow.  Chronic systolic CHF (congestive heart failure) (HCC) Holding Lasix with elevation in creatinine.  Watch closely with gentle fluids.  Last EF 45%.  COVID-19 Supportive care.  Bilateral pleural effusion Continue to monitor closely with gentle fluids.  Acute metabolic encephalopathy This has improved.  Generalized weakness At baseline, patient is currently mostly sedentary following a bilateral femoral fracture in November 2023 while working with PT Patient lives alone and family members are with her all the time. Physical therapy evaluation  Myocardial injury Troponin slightly elevated but we can get this with a stroke also.  Type 2 diabetes mellitus with circulatory disorder (HCC) With her hands and feet being cold I do not think the fingersticks are accurate at this time.  Hemoglobin A1c only 6.8.  Will continue to watch sugars with daily chemistry.  MGUS (monoclonal gammopathy of unknown significance) Anemia and thrombocytopenia Last hemoglobin 8.9 and platelet count 44.  Arthritis, degenerative Continue Tylenol and Robaxin with tramadol as needed  Essential (primary) hypertension Continue metoprolol        Subjective: Called for low sugar.  Given an amp of D50 and D5 NS.  Patient's hands are cold and likely  not giving an accurate sugar.  Will discontinue fingersticks and check with chemistry on a daily basis.  Patient found to have a stroke on MRI.  Physical Exam: Vitals:   09/22/22 2358 09/23/22 0400 09/23/22 0758 09/23/22 1308  BP: (!) 140/70 (!) 156/66 (!) 160/75   Pulse: 69 72 75   Resp: 18 (!) 23 20   Temp: 97.7 F (36.5 C) 97.7 F (36.5 C) 97.8 F (36.6 C)   TempSrc: Oral Axillary Oral   SpO2: 97% 94% 94%   Weight:    60.4 kg   Physical Exam HENT:     Head: Normocephalic.     Mouth/Throat:     Pharynx: No oropharyngeal exudate.  Eyes:     General: Lids are normal.     Conjunctiva/sclera: Conjunctivae normal.  Cardiovascular:     Rate and Rhythm: Normal rate and regular rhythm.     Heart sounds: Normal heart sounds, S1 normal and S2 normal.  Pulmonary:     Breath sounds: Examination of the right-lower field reveals decreased breath sounds. Examination of the left-lower field reveals decreased breath sounds. Decreased breath sounds present. No wheezing, rhonchi or rales.  Abdominal:     Palpations: Abdomen is soft.     Tenderness: There is no abdominal tenderness.  Musculoskeletal:     Right lower leg: No swelling.     Left lower leg: No swelling.  Skin:    General: Skin is warm.     Findings: No rash.     Comments: Feet and hands cold to the touch.  Neurological:     Mental Status: She is alert.     Comments: Answers questions appropriately.  Able to flex up and down at the ankles and squeeze my hands bilaterally.     Data Reviewed: Fingerstick on chemistry this morning 118.  Glucose 12 noon 132.  I do not think the fingersticks are accurate with poor circulation.  Will discontinue fingersticks Creatinine 1.51.  CO2 20, troponin 69 and 64, BNP 4115  Family Communication: Spoke with granddaughter on the phone  Disposition: Status is: Inpatient Remains inpatient appropriate because: Patient admitted with stroke and COVID infection.  Planned Discharge Destination:  To be determined    Time spent: 28 minutes  Author: Loletha Grayer, MD 09/23/2022 1:16 PM  For on call review [email or web address].

## 2022-09-23 NOTE — Progress Notes (Signed)
Assessed L midline at request of RN: Site occluded. Midline removed and found to be kinked. Will request oncoming RN assess for PIV access. Pt has IV hydration ordered.

## 2022-09-23 NOTE — Assessment & Plan Note (Signed)
Right cerebellar area.  With her low platelets I do not think we need to give dual antiplatelet agents at this point.  Currently on Plavix.  As per the neurology benefits of statin are outweighed by the risks of liver damage and myopathy.

## 2022-09-23 NOTE — Evaluation (Signed)
Occupational Therapy Evaluation Patient Details Name: Gabriela [name] MRN: [id number] DOB: [birth year] Today's Date: 09/23/2022   History of Present Illness 89 y.o. female with medical history significant for HTN, CAD, CKD 3a, DM, MGUS who presents to the ED by EMS with concerns for 3-day history of weakness, confusion, slurred speech and dropping things. CT negative, MRI pending. Now Covid+.   Clinical Impression   Patient received for OT evaluation. See flowsheet below for details of function. Generally, patient requiring MIN A for bed mobility, MAX A x2 for transfer to chair, and MIN-MAX A for ADLs. Patient will benefit from continued OT while in acute care.     Recommendations for follow up therapy are one component of a multi-disciplinary discharge planning process, led by the attending physician.  Recommendations may be updated based on patient status, additional functional criteria and insurance authorization.   Follow Up Recommendations  Skilled nursing-short term rehab (<3 hours/day)  Patient is below her functional baseline, especially for transfers; granddaughter stating that pt needs to be able to do slideboard to t/f or else will need to go to facility.   Assistance Recommended at Discharge Frequent or constant Supervision/Assistance  Patient can return home with the following Two people to help with walking and/or transfers;A lot of help with bathing/dressing/bathroom;Assistance with cooking/housework;Direct supervision/assist for medications management;Direct supervision/assist for financial management;Assist for transportation;Help with stairs or ramp for entrance    Functional Status Assessment  Patient has had a recent decline in their functional status and demonstrates the ability to make significant improvements in function in a reasonable and predictable amount of time.  Equipment Recommendations  Other (comment) (defer to next venue of care)    Recommendations for  Other Services       Precautions / Restrictions Precautions Precautions: Fall Restrictions Weight Bearing Restrictions: No      Mobility Bed Mobility Overal bed mobility: Needs Assistance Bed Mobility: Supine to Sit     Supine to sit: Min assist     General bed mobility comments: min assist with LEs and trunk to raise up to sitting.    Transfers Overall transfer level: Needs assistance Equipment used: Sliding board (tried slide board; pt unable to use it very well, so then did lateral scoot with MAX A x2) Transfers: Bed to chair/wheelchair/BSC     Squat pivot transfers: Max assist, +2 physical assistance, Total assist      Lateral/Scoot Transfers:  (tried a scoot with slideboard; unable to complete.)        Balance Overall balance assessment: Needs assistance Sitting-balance support: Feet supported Sitting balance-Leahy Scale: Good     Standing balance support: Bilateral upper extremity supported, During functional activity Standing balance-Leahy Scale: Poor                             ADL either performed or assessed with clinical judgement   ADL Overall ADL's : Needs assistance/impaired Eating/Feeding: Set up   Grooming: Set up;Oral care Grooming Details (indicate cue type and reason): seated in recliner; OT had to assist with opening toothpaste tube.   Upper Body Bathing Details (indicate cue type and reason): anticipate moderate assist from bed level   Lower Body Bathing Details (indicate cue type and reason): anticipate dependent bed level   Upper Body Dressing Details (indicate cue type and reason): anticipate set up from seated   Lower Body Dressing Details (indicate cue type and reason): anticipate dependent from bed level  Toilet Transfer Details (indicate cue type and reason): unable to do so safely at this time   Toileting - Clothing Manipulation Details (indicate cue type and reason): dependent at this time; pt with bowel  incontinence during session today   Tub/Shower Transfer Details (indicate cue type and reason): unsafe at this time; bed level bathing only Functional mobility during ADLs: +2 for physical assistance;Maximal assistance General ADL Comments: unable to use slideboard today; UE and LE weakness generally and decreased activity tolerance; MAX A x2 t/f to chair.     Vision Patient Visual Report:  (Medical record states chronic L eye damage)       Perception     Praxis      Pertinent Vitals/Pain Pain Assessment Pain Assessment: 0-10 Pain Score:  (unrated; chronic pain) Pain Location: RLE at the knee Pain Descriptors / Indicators: Aching Pain Intervention(s): Limited activity within patient's tolerance, Repositioned     Hand Dominance Right   Extremity/Trunk Assessment Upper Extremity Assessment Upper Extremity Assessment: Overall WFL for tasks assessed (BIL hand grip WNL for age; appears nearly symmetrical. BIL UE AROM at shoulders WNL.)   Lower Extremity Assessment Lower Extremity Assessment: Defer to PT evaluation;RLE deficits/detail RLE Deficits / Details: history of surgery on that leg (hip) and leg is internally rotated, granddaughter reports foot drop on that side (chronic) RLE Coordination: decreased gross motor   Cervical / Trunk Assessment Cervical / Trunk Assessment: Normal   Communication Communication Communication: HOH   Cognition Arousal/Alertness: Awake/alert Behavior During Therapy: WFL for tasks assessed/performed Overall Cognitive Status: Within Functional Limits for tasks assessed                                       General Comments  Pt reporting shortness of breath during session. Granddaughter states she is short of breath with activity/transfers at baseline. Unable to get accurate reading on pulseox; pt's hands very cold.    Exercises     Shoulder Instructions      Home Living Family/patient expects to be discharged to:: Private  residence Living Arrangements: Other relatives (granddaughter) Available Help at Discharge: Family;Available 24 hours/day;Personal care attendant Type of Home: House Home Access: Level entry     Home Layout: One level     Bathroom Shower/Tub: Teacher, early years/pre: Handicapped height Bathroom Accessibility: Yes How Accessible: Accessible via wheelchair;Accessible via walker Home Equipment: [place] (2 wheels);Rollator (4 wheels);Transport chair;Hospital bed;Other (comment) (drop-arm BSC, slideboard, roho cushion)   Additional Comments: Has a caregiver available for morning ADLs; granddaughter working from home in the afternoon; has caregiver at night.      Prior Functioning/Environment Prior Level of Function : Needs assist       Physical Assist : Mobility (physical);ADLs (physical) Mobility (physical): Bed mobility;Transfers ADLs (physical): Bathing;Dressing;Toileting;IADLs Mobility Comments: has been using slide board recently from bed to wheelchair with aide at home. Pt is up in w/c all day, per granddaughter ADLs Comments: pt recieves dependent assistance for bowel/bladder hygiene (has bowel incontinence at baseline); bed level bathing MAX A; seated grooming with set up/MIN A; seated eating with set up. Dependent for all IADLs. Has a drop-arm BSC, but does not use it; granddaughter said home health has never worked on [place] t/f to [facility].        OT Problem List: Decreased strength;Decreased activity tolerance;Impaired balance (sitting and/or standing)      OT Treatment/Interventions: Self-care/ADL training;Therapeutic  exercise;Therapeutic activities    OT Goals(Current goals can be found in the care plan section) Acute Rehab OT Goals Patient Stated Goal: Get better OT Goal Formulation: With patient Time For Goal Achievement: 10/07/22 Potential to Achieve Goals: Fair ADL Goals Pt Will Perform Grooming: with modified independence;sitting Pt Will  Perform Upper Body Bathing: with min assist;bed level  OT Frequency: Min 2X/week    Co-evaluation PT/OT/SLP Co-Evaluation/Treatment: Yes Reason for Co-Treatment: For patient/therapist safety;To address functional/ADL transfers;Complexity of the patient's impairments (multi-system involvement) PT goals addressed during session: Mobility/safety with mobility;Balance;Proper use of DME OT goals addressed during session: ADL's and self-care      AM-PAC OT "6 Clicks" Daily Activity     Outcome Measure Help from another person eating meals?: None Help from another person taking care of personal grooming?: A Little Help from another person toileting, which includes using toliet, bedpan, or urinal?: Total Help from another person bathing (including washing, rinsing, drying)?: Total Help from another person to put on and taking off regular upper body clothing?: A Little Help from another person to put on and taking off regular lower body clothing?: Total 6 Click Score: 13   End of Session Equipment Utilized During Treatment: Gait belt Nurse Communication: Mobility status  Activity Tolerance: Patient limited by fatigue Patient left: in chair;with call bell/phone within reach  OT Visit Diagnosis: Unsteadiness on feet (R26.81);Other abnormalities of gait and mobility (R26.89);Muscle weakness (generalized) (M62.81)                Time: [id number] OT Time Calculation (min): 46 min Charges:  OT General Charges $OT Visit: 1 Visit OT Evaluation $OT Eval Moderate Complexity: 1 Mod OT Treatments $Therapeutic Activity: 8-22 mins  Waymon Amato, MS, OTR/L   Vania Rea 09/23/2022, 3:39 PM

## 2022-09-23 NOTE — Hospital Course (Signed)
89 y.o. female with medical history significant for HTN, CAD, CKD 3a, DM, MGUS who presents to the ED by EMS with concerns for 3-day history of weakness, confusion, slurred speech and dropping things.  Most of the history is given by the granddaughter at the bedside who stated that she noticed that her grandmother has increasingly worsening swelling of the bilateral lower extremities up to the thighs in spite of taking her lasix 10 mg daily.  She has no prior history of CHF and states she was prescribed Lasix by her PCP.  She took her to the nephrologist on 2/1 changed to Lasix to torsemide and ordered compression socks.  She stated that since the medication was changed, her grandmother continue to have swelling and seem to be weaker and she also developed a cough that has become increasingly more congested.  She is not short of breath but at baseline she does not move around a lot.  She has had no fever or chills, vomiting or diarrhea.  She denies chest pain. ED course and data review: Afebrile with unremarkable vitals. Labs significant for troponin of 60 and BNP 4115.  WBC 11,500 with hemoglobin 8.8, platelets 51,000.  Creatinine 1.57 above baseline of 0.95 a month ago respiratory viral panel positive for COVID.  EKG, personally viewed and interpreted showing sinus at 77 with T wave inversion in lateral leads.  Chest x-ray nonacute and shows atrophy and chronic small vessel ischemic changes.  Chest x-ray showing bilateral pleural effusions with basilar atelectasis. MRI ordered to evaluate for stroke.  Hospitalist consulted for admission.   MRI showed a 7 mm acute ischemic nonhemorrhagic right cerebral infarct.   2/9.  Called with low sugar.  Given an amp of D50 and started on IV fluids with D5 minute.  I do not think her fingersticks are accurate secondary to her hands being cold and poor circulation.  Chemistries fingerstick this morning was 118 and 12 noon sugar was 132.  Will discontinue fingersticks and  recheck with chemistry on a daily basis. 2/10.  Patient still with cough and some shortness of breath. 2/11.  Patient with cough and wheezing and hypoxia with nursing staff.  Ordered nebulizer treatments and 1 dose of prednisone and 1 dose of Lasix.

## 2022-09-23 NOTE — Progress Notes (Addendum)
Speech Language Pathology Treatment: Dysphagia  Patient Details Name: SOFIAMARIE ANSELL MRN: [id number] DOB: 10-29-1925 Today's Date: 09/23/2022 Time: [id number] SLP Time Calculation (min) (ACUTE ONLY): 28 min  Assessment / Plan / Recommendation Clinical Impression  Pt seen for ongoing assessment of swallowing. Pt sitting up in chair post PT/OT session. Noted a hacking cough PRIOR TO any po's, moreso when moving about in chair to sit forward more. Pt was verbal; followed instructions. Noted potential HOH - repetition given intermittently.  OF NOTE: suspect pt's speech articulation is impacted by her lacking MOST Dentition. Speech was fully intelligible during this session.  Afebrile, WBC WNL. [place] O2 2L.  Pt appears to present w/ grossly functional oropharyngeal phase swallow w/ No pharyngeal phase dysphagia noted, No neuromuscular deficits noted. Pt required min extra time for mashing/gumming of foods d/t essentially an Edentulous status(4 teeth reported). No Dentures present. Pt consumed po trials w/ No immediate, overt clinical s/s of aspiration during po trials.  Pt appears at reduced risk for aspiration following general aspiration precautions and using a slightly modified consistency diet of well-cut, moistened foods for ease of mashing/gumming when eating.   However, pt does have challenging factors that could impact her oropharyngeal swallowing to include discomfort reported to PT/OT(NSG aware), fatigue/weakness, chronic declined pulmonary status and HD, and advanced age as well as current illness including COVID+. These factors can increase risk for dysphagia, aspiration as well as decreased oral intake/nutrition overall.   During po trials, pt consumed all consistencies w/ no overt coughing, decline in vocal quality, or change in respiratory presentation during/post trials. Oral phase appeared grossly [facility] w/ timely bolus management, mashing/gumming of softened solids, and control of bolus  propulsion for A-P transfer for swallowing. Oral clearing achieved w/ all trial consistencies -- Time given b/t solid trials. Also encouraged pt to alternate foods/drinks and use lingual sweeping to ensure oral clearing. Pt fed self w/ setup support.   Recommend continue a more Mech Soft consistency diet w/ well-Cut meats, moistened foods for ease of oral phase in setting of a mostly Edentulous status; Thin liquids. Monitor sitting fully upright for all oral intake. Recommend general aspiration precautions, tray setup and support during meals as needed. Pills WHOLE in Puree for safer, easier swallowing -- pt has been doing well w/ this per NSG(it was encourged now and for D/C).   Education given on Pills in Puree; food consistencies and easy to eat options; general aspiration precautions to pt. NSG updated, agreed. MD updated.  Recommend Dietician f/u for support d/t report of not eating much per Dtr. Recommend f/u by [facility] services for [place] discussion and education. ST services can be available for further education as needed while admitted.       HPI HPI: Per H&P, pt is a 89 y.o. female with medical history significant for HTN, CAD, CKD 3a, DM, MGUS who presents to the ED by EMS with concerns for 3-day history of weakness, confusion, slurred speech and dropping things.  Most of the history is given by the granddaughter at the bedside who stated that she noticed that her grandmother has increasingly worsening swelling of the bilateral lower extremities up to the thighs in spite of taking her lasix 10 mg daily. She has no prior history of CHF and states she was prescribed Lasix by her PCP.  She took her to the nephrologist on 2/1 changed to Lasix to torsemide and ordered compression socks.  She stated that since the medication was changed, her grandmother continue to  have swelling and seem to be weaker and she also developed a cough that has become increasingly more congested.  She is not short of  breath but at baseline she does not move around a lot.  She has had no fever or chills, vomiting or diarrhea.  She denies chest pain. ED course and data review: Afebrile with unremarkable vitals. WBC 11,500. Respiratory viral panel positive for COVID. Chest x-ray nonacute and shows atrophy and chronic small vessel ischemic changes.  Chest x-ray showing bilateral pleural effusions with basilar atelectasis.  MRI ordered to evaluate for stroke.      SLP Plan  Continue with current plan of care (as needed)      Recommendations for follow up therapy are one component of a multi-disciplinary discharge planning process, led by the attending physician.  Recommendations may be updated based on patient status, additional functional criteria and insurance authorization.    Recommendations  Diet recommendations: Dysphagia 3 (mechanical soft);Thin liquid (minced meats w/ gravies) Liquids provided via: Cup;Straw Medication Administration: Whole meds with puree Supervision: Patient able to self feed;Intermittent supervision to cue for compensatory strategies (setup support) Compensations: Minimize environmental distractions;Slow rate;Small sips/bites;Follow solids with liquid Postural Changes and/or Swallow Maneuvers: Out of bed for meals;Seated upright 90 degrees;Upright 30-60 min after meal                General recommendations:  (Dietician f/u) Oral Care Recommendations: Oral care BID;Oral care before and after PO;Staff/trained caregiver to provide oral care (support) Follow Up Recommendations: Follow physician's recommendations for discharge plan and follow up therapies Assistance recommended at discharge: Set up Supervision/Assistance SLP Visit Diagnosis: Dysphagia, unspecified (R13.10) (mostly Edentulous - 4 teeth per pt) Plan: Continue with current plan of care (as needed)            Orinda Kenner, MS, CCC-SLP Speech Language Pathologist Rehab Services; [place] [phone] (ascom) Ledia Hanford  09/23/2022, 4:00 PM

## 2022-09-23 NOTE — Assessment & Plan Note (Signed)
Continue to monitor

## 2022-09-23 NOTE — Assessment & Plan Note (Signed)
Restarted oral Lasix today.  Last EF 45%.

## 2022-09-23 NOTE — Evaluation (Addendum)
Physical Therapy Evaluation Patient Details Name: Gabriela [name] MRN: [id number] DOB: 11-Feb-1926 Today's Date: 09/23/2022  History of Present Illness  89 y.o. female with medical history significant for HTN, CAD, CKD 3a, DM, MGUS who presents to the ED by EMS with concerns for 3-day history of weakness, confusion, slurred speech and dropping things. CT negative, MRI pending. MRI shows CVA.   Clinical Impression  Patient received in bed, OT and granddaughter in room. She is agreeable to PT/OT session. Patient required min A for bed mobility. Max assist to attempt slide -board transfer to recliner. Patient was unable to safely complete this transfer and was ultimately transferred to recliner via Max +2 squat pivot transfer. She will continue to benefit from skilled PT to improve strength and functional independence         Recommendations for follow up therapy are one component of a multi-disciplinary discharge planning process, led by the attending physician.  Recommendations may be updated based on patient status, additional functional criteria and insurance authorization.  Follow Up Recommendations Skilled nursing-short term rehab (<3 hours/day) Can patient physically be transported by private vehicle: No    Assistance Recommended at Discharge Frequent or constant Supervision/Assistance  Patient can return home with the following  Assist for transportation;Direct supervision/assist for medications management;Assistance with cooking/housework;Two people to help with walking and/or transfers;A lot of help with bathing/dressing/bathroom    Equipment Recommendations None recommended by PT  Recommendations for Other Services       Functional Status Assessment Patient has had a recent decline in their functional status and demonstrates the ability to make significant improvements in function in a reasonable and predictable amount of time.     Precautions / Restrictions  Precautions Precautions: Fall Restrictions Weight Bearing Restrictions: No      Mobility  Bed Mobility Overal bed mobility: Needs Assistance Bed Mobility: Supine to Sit     Supine to sit: Min assist     General bed mobility comments: min assist with LEs and trunk to raise up to sitting.    Transfers Overall transfer level: Needs assistance Equipment used: Sliding board Transfers: Bed to chair/wheelchair/BSC       Squat pivot transfers: Max assist, +2 physical assistance, Total assist    Lateral/Scoot Transfers: Max assist General transfer comment: patient attempted to use slide board to get to recliner. Was not super effective therefore performed transfer with Max +2 assist    Ambulation/Gait               General Gait Details: unable  Stairs            Wheelchair Mobility    Modified Rankin (Stroke Patients Only)       Balance Overall balance assessment: Needs assistance Sitting-balance support: Feet supported Sitting balance-Leahy Scale: Good     Standing balance support: Bilateral upper extremity supported, During functional activity Standing balance-Leahy Scale: Poor                               Pertinent Vitals/Pain Pain Assessment Pain Assessment: No/denies pain    Home Living Family/patient expects to be discharged to:: Private residence Living Arrangements: Other relatives Available Help at Discharge: Family;Available PRN/intermittently;Available 24 hours/day;Personal care attendant Type of Home: House Home Access: Level entry       Home Layout: One level Home Equipment: Conservation officer, nature (2 wheels);Rollator (4 wheels);Transport chair;Hospital bed Additional Comments: Had caregiver who comes in 7 days a week  to do IADLs (cooking, cleaning, laundry), however both available 24/7 if needed, with plan to renovate bathroom for accessibility, has ramp inside the house to access bathroom    Prior Function Prior Level of  Function : Needs assist       Physical Assist : Mobility (physical)     Mobility Comments: has been using slide board recently from bed to wheelchair with PT at home. ADLs Comments: modI w ADLs/IADLs, has aide in morning 7 days/week     Hand Dominance   Dominant Hand: Right    Extremity/Trunk Assessment   Upper Extremity Assessment Upper Extremity Assessment: Defer to OT evaluation    Lower Extremity Assessment Lower Extremity Assessment: Generalized weakness;RLE deficits/detail RLE Deficits / Details: history of surgery on that leg (hip) and leg is internally rotated, granddaughter reports foot drop on that side (chronic) RLE Coordination: decreased gross motor    Cervical / Trunk Assessment Cervical / Trunk Assessment: Normal  Communication   Communication: HOH  Cognition Arousal/Alertness: Awake/alert Behavior During Therapy: WFL for tasks assessed/performed Overall Cognitive Status: Within Functional Limits for tasks assessed                                          General Comments      Exercises     Assessment/Plan    PT Assessment Patient needs continued PT services  PT Problem List Decreased strength;Decreased activity tolerance;Decreased mobility;Decreased balance;Cardiopulmonary status limiting activity;Decreased skin integrity       PT Treatment Interventions Functional mobility training;Therapeutic activities;Patient/family education    PT Goals (Current goals can be found in the Care Plan section)  Acute Rehab PT Goals Patient Stated Goal: grandaughter told OT that patient will have to go to SNF if she cannot slide board transfer PT Goal Formulation: With family Time For Goal Achievement: 10/07/22 Potential to Achieve Goals: Fair    Frequency Min 2X/week     Co-evaluation PT/OT/SLP Co-Evaluation/Treatment: Yes Reason for Co-Treatment: For patient/therapist safety;To address functional/ADL transfers;Complexity of the  patient's impairments (multi-system involvement) PT goals addressed during session: Mobility/safety with mobility;Balance;Proper use of DME         AM-PAC PT "6 Clicks" Mobility  Outcome Measure Help needed turning from your back to your side while in a flat bed without using bedrails?: A Little Help needed moving from lying on your back to sitting on the side of a flat bed without using bedrails?: A Little Help needed moving to and from a bed to a chair (including a wheelchair)?: Total Help needed standing up from a chair using your arms (e.g., wheelchair or bedside chair)?: Total Help needed to walk in hospital room?: Total Help needed climbing 3-5 steps with a railing? : Total 6 Click Score: 10    End of Session Equipment Utilized During Treatment: Gait belt Activity Tolerance: Patient limited by fatigue;Other (comment) (SOB) Patient left: in chair;with call bell/phone within reach;Other (comment) (OT still in room) Nurse Communication: Mobility status;Other (comment) (bed needs changed) PT Visit Diagnosis: Other abnormalities of gait and mobility (R26.89);Muscle weakness (generalized) (M62.81)    Time: [id number] PT Time Calculation (min) (ACUTE ONLY): 35 min   Charges:   PT Evaluation $PT Eval Moderate Complexity: 1 Mod PT Treatments $Therapeutic Activity: 8-22 mins        Sheylin Scharnhorst, PT, GCS 09/23/22,3:15 PM

## 2022-09-24 DIAGNOSIS — I639 Cerebral infarction, unspecified: Secondary | ICD-10-CM | POA: Diagnosis not present

## 2022-09-24 DIAGNOSIS — I5022 Chronic systolic (congestive) heart failure: Secondary | ICD-10-CM | POA: Diagnosis not present

## 2022-09-24 DIAGNOSIS — J90 Pleural effusion, not elsewhere classified: Secondary | ICD-10-CM | POA: Diagnosis not present

## 2022-09-24 DIAGNOSIS — N170 Acute kidney failure with tubular necrosis: Secondary | ICD-10-CM | POA: Diagnosis not present

## 2022-09-24 LAB — CBC
HCT: 33.4 % — ABNORMAL LOW (ref 36.0–46.0)
Hemoglobin: 10.2 g/dL — ABNORMAL LOW (ref 12.0–15.0)
MCH: 25 pg — ABNORMAL LOW (ref 26.0–34.0)
MCHC: 30.5 g/dL (ref 30.0–36.0)
MCV: 81.9 fL (ref 80.0–100.0)
Platelets: 41 10*3/uL — ABNORMAL LOW (ref 150–400)
RBC: 4.08 MIL/uL (ref 3.87–5.11)
RDW: 21 % — ABNORMAL HIGH (ref 11.5–15.5)
WBC: 7.6 10*3/uL (ref 4.0–10.5)
nRBC: 0.3 % — ABNORMAL HIGH (ref 0.0–0.2)

## 2022-09-24 LAB — BASIC METABOLIC PANEL
Anion gap: 8 (ref 5–15)
BUN: 35 mg/dL — ABNORMAL HIGH (ref 8–23)
CO2: 25 mmol/L (ref 22–32)
Calcium: 8.1 mg/dL — ABNORMAL LOW (ref 8.9–10.3)
Chloride: 106 mmol/L (ref 98–111)
Creatinine, Ser: 1.45 mg/dL — ABNORMAL HIGH (ref 0.44–1.00)
GFR, Estimated: 33 mL/min — ABNORMAL LOW (ref >60)
Glucose, Bld: 156 mg/dL — ABNORMAL HIGH (ref 70–99)
Potassium: 4 mmol/L (ref 3.5–5.1)
Sodium: 139 mmol/L (ref 135–145)

## 2022-09-24 LAB — GLUCOSE, CAPILLARY: Glucose-Capillary: 183 mg/dL — ABNORMAL HIGH (ref 70–99)

## 2022-09-24 MED ORDER — GLUCERNA SHAKE PO LIQD
237.0000 mL | Freq: Three times a day (TID) | ORAL | Status: DC
  Administered 2022-09-24 – 2022-10-03 (×26): 237 mL via ORAL

## 2022-09-24 NOTE — Progress Notes (Signed)
Pt difficult IV start. Pt has had multiple US guided IVs as well as a midline that has lasted 24hrs or less. Recommend PICC placement if pt continues to need IV access. Primary RN notified.

## 2022-09-24 NOTE — Progress Notes (Signed)
Progress Note   [name] [id number] DOB: May 11, 1926 DOA: 09/21/2022     3 DOS: the patient was seen and examined on 09/24/2022   Brief hospital course: 89 y.o. female with medical history significant for HTN, CAD, CKD 3a, DM, MGUS who presents to the ED by EMS with concerns for 3-day history of weakness, confusion, slurred speech and dropping things.  Most of the history is given by the granddaughter at the bedside who stated that she noticed that her grandmother has increasingly worsening swelling of the bilateral lower extremities up to the thighs in spite of taking her lasix 10 mg daily.  She has no prior history of CHF and states she was prescribed Lasix by her PCP.  She took her to the nephrologist on 2/1 changed to Lasix to torsemide and ordered compression socks.  She stated that since the medication was changed, her grandmother continue to have swelling and seem to be weaker and she also developed a cough that has become increasingly more congested.  She is not short of breath but at baseline she does not move around a lot.  She has had no fever or chills, vomiting or diarrhea.  She denies chest pain. ED course and data review: Afebrile with unremarkable vitals. Labs significant for troponin of 60 and BNP 4115.  WBC 11,500 with hemoglobin 8.8, platelets 51,000.  Creatinine 1.57 above baseline of 0.95 a month ago respiratory viral panel positive for COVID.  EKG, personally viewed and interpreted showing sinus at 77 with T wave inversion in lateral leads.  Chest x-ray nonacute and shows atrophy and chronic small vessel ischemic changes.  Chest x-ray showing bilateral pleural effusions with basilar atelectasis. MRI ordered to evaluate for stroke.  Hospitalist consulted for admission.   MRI showed a 7 mm acute ischemic nonhemorrhagic right cerebral infarct.   2/9.  Called with low sugar.  Given an amp of D50 and started on IV fluids with D5 minute.  I do not think her fingersticks  are accurate secondary to her hands being cold and poor circulation.  Chemistries fingerstick this morning was 118 and 12 noon sugar was 132.  Will discontinue fingersticks and recheck with chemistry on a daily basis. 2/10.  Patient still with cough and some shortness of breath.   Assessment and Plan: * Acute stroke due to ischemia [facility]) Right cerebellar area.  With her low platelets I do not think we need to give dual antiplatelet agents at this point.  Currently on Plavix.  As per the neurology benefits of statin are outweighed by the risks of liver damage and myopathy.  Acute renal failure superimposed on stage 3a chronic kidney disease (HCC) Creatinine 1.57, above baseline of 0.95 a month prior.  Hold Lasix at this point and use gentle fluids.  Recheck creatinine tomorrow.  Today's creatinine down to 1.45.  Chronic systolic CHF (congestive heart failure) (HCC) Holding Lasix with elevation in creatinine.  Watch closely with gentle fluids.  Last EF 45%.  COVID-19 Supportive care.  Bilateral pleural effusion Continue to monitor closely with gentle fluids.  Acute metabolic encephalopathy This has improved.  Generalized weakness At baseline, patient is currently mostly sedentary following a bilateral femoral fracture in November 2023 while working with PT Patient lives alone and family members are with her all the time. As per patient's granddaughter, they will take her home if she does better with board transfers.  Otherwise they will consider rehab.  Myocardial injury Troponin slightly elevated but we can  get this with a stroke also.  Type 2 diabetes mellitus with circulatory disorder (HCC) With her hands and feet being cold I do not think the fingersticks are accurate at this time.  Hemoglobin A1c only 6.8.  Will continue to watch sugars with daily chemistry.  MGUS (monoclonal gammopathy of unknown significance) Anemia and thrombocytopenia Last hemoglobin 10.2 and last platelet  count 41.  Arthritis, degenerative Continue Tylenol and Robaxin with tramadol as needed  Essential (primary) hypertension Continue metoprolol        Subjective: Patient does have some cough and some shortness of breath.  Seen sitting in the chair today.  Patient interested in going home.  Granddaughter concerned about her board transfers and needs to do better with that prior to deciding on home or not.  Physical Exam: Vitals:   09/23/22 1700 09/23/22 2356 09/24/22 0411 09/24/22 0758  BP: 114/64 (!) 129/54 (!) 124/59 127/80  Pulse: 70 74 74 70  Resp:  17 19 20  $ Temp:  97.6 F (36.4 C) 97.8 F (36.6 C) 97.9 F (36.6 C)  TempSrc:    Oral  SpO2:  90% 100% 100%  Weight:    59.9 kg   Physical Exam HENT:     Head: Normocephalic.     Mouth/Throat:     Pharynx: No oropharyngeal exudate.  Eyes:     General: Lids are normal.     Conjunctiva/sclera: Conjunctivae normal.  Cardiovascular:     Rate and Rhythm: Normal rate and regular rhythm.     Heart sounds: Normal heart sounds, S1 normal and S2 normal.  Pulmonary:     Breath sounds: Examination of the right-lower field reveals decreased breath sounds and rhonchi. Examination of the left-lower field reveals decreased breath sounds and rhonchi. Decreased breath sounds and rhonchi present. No wheezing or rales.  Abdominal:     Palpations: Abdomen is soft.     Tenderness: There is no abdominal tenderness.  Musculoskeletal:     Right lower leg: No swelling.     Left lower leg: No swelling.  Skin:    General: Skin is warm.     Findings: No rash.     Comments: Feet cool to touch.  Hands a little warmer today than yesterday.  Neurological:     Mental Status: She is alert.     Comments: Answers questions appropriately.  Able to flex up and down at the ankles and squeeze my hands bilaterally.     Data Reviewed: Creatinine 1.45, glucose 156 on chemistry, hemoglobin 10.2, platelet count 41  Family Communication: Spoke with  patient's granddaughter on the phone  Disposition: Status is: Inpatient Remains inpatient appropriate because: Watching closely for COVID infection, IV fluids for acute kidney injury, treating for stroke.  Planned Discharge Destination: Home with Home Health if does better with board transfer.  Rehab if does not do better with board transfer    Time spent: 28 minutes  Author: Loletha Grayer, MD 09/24/2022 11:51 AM  For on call review [email or web address].

## 2022-09-24 NOTE — Progress Notes (Signed)
Speech Language Pathology Treatment: Dysphagia  Patient Details Name: Gabriela [name] MRN: [id number] DOB: 28-Jul-1926 Today's Date: 09/24/2022 Time: [id number] SLP Time Calculation (min) (ACUTE ONLY): 10 min  Assessment / Plan / Recommendation Clinical Impression  Pt seen for on going diet toleration. Pt continues to consume currently prescribed diet without overt s/s of aspiration or dysphagia. In spite of this, her consumption continues to be reduced d/t food preferences and temperature of food once it arrives to your room. Her daughter is working with her to order meals that would include pt might like.   At this time, skilled intervention is not indicated. ST will sign off.     HP HPI: Per H&P, pt is a 89 y.o. female with medical history significant for HTN, CAD, CKD 3a, DM, MGUS who presents to the ED by EMS with concerns for 3-day history of weakness, confusion, slurred speech and dropping things.  Most of the history is given by the granddaughter at the bedside who stated that she noticed that her grandmother has increasingly worsening swelling of the bilateral lower extremities up to the thighs in spite of taking her lasix 10 mg daily. She has no prior history of CHF and states she was prescribed Lasix by her PCP.  She took her to the nephrologist on 2/1 changed to Lasix to torsemide and ordered compression socks.  She stated that since the medication was changed, her grandmother continue to have swelling and seem to be weaker and she also developed a cough that has become increasingly more congested.  She is not short of breath but at baseline she does not move around a lot.  She has had no fever or chills, vomiting or diarrhea.  She denies chest pain. ED course and data review: Afebrile with unremarkable vitals. WBC 11,500. Respiratory viral panel positive for COVID. Chest x-ray nonacute and shows atrophy and chronic small vessel ischemic changes.  Chest x-ray showing bilateral pleural  effusions with basilar atelectasis.  MRI ordered to evaluate for stroke.      SLP Plan  All goals met      Recommendations for follow up therapy are one component of a multi-disciplinary discharge planning process, led by the attending physician.  Recommendations may be updated based on patient status, additional functional criteria and insurance authorization.    Recommendations  Diet recommendations: Dysphagia 3 (mechanical soft);Thin liquid Liquids provided via: Cup;Straw Medication Administration: Whole meds with puree Supervision: Patient able to self feed;Intermittent supervision to cue for compensatory strategies Compensations: Minimize environmental distractions;Slow rate;Small sips/bites;Follow solids with liquid Postural Changes and/or Swallow Maneuvers: Out of bed for meals;Seated upright 90 degrees;Upright 30-60 min after meal                Oral Care Recommendations: Oral care BID;Oral care before and after PO;Staff/trained caregiver to provide oral care Follow Up Recommendations: Follow physician's recommendations for discharge plan and follow up therapies Assistance recommended at discharge: Set up Supervision/Assistance SLP Visit Diagnosis: Dysphagia, unspecified (R13.10) Plan: All goals met        Gabriela [name], M.S., CCC-SLP, Mining engineer Certified Brain Injury [place]  [place] Office [phone] Ascom [phone] Fax [phone]

## 2022-09-24 NOTE — Progress Notes (Signed)
Notified on call NP patient does not have IV access. Attempted 3 times and notified Vascular access/IV team if they could start IV. Was informed they are unable to and PICC placement was recommended. Day shift stated MD doesn't want PICC placed and is ok with patient not having access if it is lost. NP aware, will reclarify with attending in the morning. Patient is hemodynamically stable. Will continue to monitor.

## 2022-09-25 ENCOUNTER — Inpatient Hospital Stay: Payer: 59

## 2022-09-25 DIAGNOSIS — J9601 Acute respiratory failure with hypoxia: Secondary | ICD-10-CM | POA: Diagnosis not present

## 2022-09-25 DIAGNOSIS — I5022 Chronic systolic (congestive) heart failure: Secondary | ICD-10-CM | POA: Diagnosis not present

## 2022-09-25 DIAGNOSIS — U071 COVID-19: Secondary | ICD-10-CM | POA: Diagnosis not present

## 2022-09-25 DIAGNOSIS — I639 Cerebral infarction, unspecified: Secondary | ICD-10-CM | POA: Diagnosis not present

## 2022-09-25 LAB — BASIC METABOLIC PANEL WITH GFR
Anion gap: 5 (ref 5–15)
BUN: 31 mg/dL — ABNORMAL HIGH (ref 8–23)
CO2: 25 mmol/L (ref 22–32)
Calcium: 7.9 mg/dL — ABNORMAL LOW (ref 8.9–10.3)
Chloride: 115 mmol/L — ABNORMAL HIGH (ref 98–111)
Creatinine, Ser: 1.17 mg/dL — ABNORMAL HIGH (ref 0.44–1.00)
GFR, Estimated: 43 mL/min — ABNORMAL LOW
Glucose, Bld: 98 mg/dL (ref 70–99)
Potassium: 3.5 mmol/L (ref 3.5–5.1)
Sodium: 145 mmol/L (ref 135–145)

## 2022-09-25 LAB — LIPID PANEL
Cholesterol: 79 mg/dL (ref 0–200)
HDL: 25 mg/dL — ABNORMAL LOW (ref >40)
LDL Cholesterol: 40 mg/dL (ref 0–99)
Total CHOL/HDL Ratio: 3.2 RATIO
Triglycerides: 72 mg/dL (ref <150)
VLDL: 14 mg/dL (ref 0–40)

## 2022-09-25 MED ORDER — PREDNISONE 20 MG PO TABS
40.0000 mg | ORAL_TABLET | Freq: Once | ORAL | Status: AC
  Administered 2022-09-25: 40 mg via ORAL
  Filled 2022-09-25: qty 2

## 2022-09-25 MED ORDER — FUROSEMIDE 40 MG PO TABS
40.0000 mg | ORAL_TABLET | Freq: Once | ORAL | Status: AC
  Administered 2022-09-25: 40 mg via ORAL
  Filled 2022-09-25: qty 1

## 2022-09-25 MED ORDER — FUROSEMIDE 20 MG PO TABS
20.0000 mg | ORAL_TABLET | Freq: Every day | ORAL | Status: DC
  Administered 2022-09-26 – 2022-10-03 (×8): 20 mg via ORAL
  Filled 2022-09-25 (×8): qty 1

## 2022-09-25 MED ORDER — IPRATROPIUM-ALBUTEROL 0.5-2.5 (3) MG/3ML IN SOLN
3.0000 mL | Freq: Four times a day (QID) | RESPIRATORY_TRACT | Status: DC
  Administered 2022-09-25 – 2022-09-26 (×4): 3 mL via RESPIRATORY_TRACT
  Filled 2022-09-25 (×4): qty 3

## 2022-09-25 MED ORDER — BUDESONIDE 0.25 MG/2ML IN SUSP
0.2500 mg | Freq: Two times a day (BID) | RESPIRATORY_TRACT | Status: DC
  Administered 2022-09-25 – 2022-09-26 (×3): 0.25 mg via RESPIRATORY_TRACT
  Filled 2022-09-25 (×4): qty 2

## 2022-09-25 NOTE — Progress Notes (Signed)
Progress Note   [name] [id number] DOB: May 01, 1926 DOA: 09/21/2022     4 DOS: the patient was seen and examined on 09/25/2022   Brief hospital course: 89 y.o. female with medical history significant for HTN, CAD, CKD 3a, DM, MGUS who presents to the ED by EMS with concerns for 3-day history of weakness, confusion, slurred speech and dropping things.  Most of the history is given by the granddaughter at the bedside who stated that she noticed that her grandmother has increasingly worsening swelling of the bilateral lower extremities up to the thighs in spite of taking her lasix 10 mg daily.  She has no prior history of CHF and states she was prescribed Lasix by her PCP.  She took her to the nephrologist on 2/1 changed to Lasix to torsemide and ordered compression socks.  She stated that since the medication was changed, her grandmother continue to have swelling and seem to be weaker and she also developed a cough that has become increasingly more congested.  She is not short of breath but at baseline she does not move around a lot.  She has had no fever or chills, vomiting or diarrhea.  She denies chest pain. ED course and data review: Afebrile with unremarkable vitals. Labs significant for troponin of 60 and BNP 4115.  WBC 11,500 with hemoglobin 8.8, platelets 51,000.  Creatinine 1.57 above baseline of 0.95 a month ago respiratory viral panel positive for COVID.  EKG, personally viewed and interpreted showing sinus at 77 with T wave inversion in lateral leads.  Chest x-ray nonacute and shows atrophy and chronic small vessel ischemic changes.  Chest x-ray showing bilateral pleural effusions with basilar atelectasis. MRI ordered to evaluate for stroke.  Hospitalist consulted for admission.   MRI showed a 7 mm acute ischemic nonhemorrhagic right cerebral infarct.   2/9.  Called with low sugar.  Given an amp of D50 and started on IV fluids with D5 minute.  I do not think her fingersticks  are accurate secondary to her hands being cold and poor circulation.  Chemistries fingerstick this morning was 118 and 12 noon sugar was 132.  Will discontinue fingersticks and recheck with chemistry on a daily basis. 2/10.  Patient still with cough and some shortness of breath. 2/11.  Patient with cough and wheezing and hypoxia with nursing staff.  Ordered nebulizer treatments and 1 dose of prednisone and 1 dose of Lasix.  Assessment and Plan: * Acute hypoxic respiratory failure (HCC) Pulse ox of 79% today.  Oxygen supplementation.  1 dose of prednisone.  1 dose of Lasix.  Nebulizers ordered.  New wheezing heard today.  Acute stroke due to ischemia [facility]) Right cerebellar area.  With her low platelets I do not think we need to give dual antiplatelet agents at this point.  Currently on Plavix.  As per the neurology benefits of statin are outweighed by the risks of liver damage and myopathy.  Chronic systolic CHF (congestive heart failure) (HCC) Restarted oral Lasix today.  Last EF 45%.  COVID-19 With wheezing today I did order nebulizer treatments and 1 dose of prednisone.  Bilateral pleural effusion Continue to monitor  Acute renal failure superimposed on stage 3a chronic kidney disease (HCC) Creatinine 1.57, above baseline of 0.95 a month prior.  Creatinine 1.17 today.  Restart Lasix and watch creatinine.  Acute metabolic encephalopathy This has improved.  Generalized weakness At baseline, patient is currently mostly sedentary following a bilateral femoral fracture in November 2023 while  working with PT Patient lives alone and family members are with her all the time. As per patient's granddaughter, they will take her home if she does better with board transfers.  Otherwise they will consider rehab.  Myocardial injury Troponin slightly elevated but we can get this with a stroke also.  Type 2 diabetes mellitus with circulatory disorder (HCC) With her hands and feet being cold I do  not think the fingersticks are accurate at this time.  Hemoglobin A1c only 6.8.  Will continue to watch sugars with daily chemistry.  MGUS (monoclonal gammopathy of unknown significance) Anemia and thrombocytopenia Last hemoglobin 10.2 and last platelet count 41.  Arthritis, degenerative Continue Tylenol and Robaxin with tramadol as needed  Essential (primary) hypertension Continue metoprolol        Subjective: Called by nursing staff this morning that her breathing was labored.  Happened after she turned her in the bed.  Patient wheezing and coughing more today than previous.  Physical Exam: Vitals:   09/25/22 0438 09/25/22 0837 09/25/22 1247 09/25/22 1302  BP: (!) 134/54 (!) 138/57 (!) 151/64   Pulse: 68 (!) 101    Resp: (!) 21 16 18   $ Temp: 97.8 F (36.6 C) 98.2 F (36.8 C) 97.6 F (36.4 C)   TempSrc:   Oral   SpO2: 100% (!) 79%  100%  Weight:  58.4 kg     Physical Exam HENT:     Head: Normocephalic.     Mouth/Throat:     Pharynx: No oropharyngeal exudate.  Eyes:     General: Lids are normal.     Conjunctiva/sclera: Conjunctivae normal.  Cardiovascular:     Rate and Rhythm: Normal rate and regular rhythm.     Heart sounds: Normal heart sounds, S1 normal and S2 normal.  Pulmonary:     Breath sounds: Examination of the right-middle field reveals decreased breath sounds and wheezing. Examination of the left-middle field reveals decreased breath sounds and wheezing. Examination of the right-lower field reveals decreased breath sounds and rhonchi. Examination of the left-lower field reveals decreased breath sounds and rhonchi. Decreased breath sounds, wheezing and rhonchi present. No rales.  Abdominal:     Palpations: Abdomen is soft.     Tenderness: There is no abdominal tenderness.  Musculoskeletal:     Right lower leg: No swelling.     Left lower leg: No swelling.  Skin:    General: Skin is warm.     Findings: No rash.     Comments: Feet cool to touch.  Hands a  little warmer today than yesterday.  Neurological:     Mental Status: She is alert.     Comments: Answers questions appropriately.  Able to flex up and down at the ankles and squeeze my hands bilaterally.     Data Reviewed: Creatinine 1.17, potassium 3.5, LDL 40, hemoglobin 10.2  Family Communication: Updated patient's granddaughter on the phone  Disposition: Status is: Inpatient Remains inpatient appropriate because: Family still thinking about rehab versus home with home health.  The patient will have to board transfer better prior to going home.  Planned Discharge Destination: Rehab    Time spent: 28 minutes  Author: Loletha Grayer, MD 09/25/2022 1:12 PM  For on call review [email or web address].

## 2022-09-25 NOTE — TOC Progression Note (Signed)
Transition of Care [facility]) - Progression Note    Patient Details  Name: Gabriela [name] MRN: [id number] Date of Birth: 07/18/1926  Transition of Care [facility]) CM/SW Contact  Izola Price, RN Phone Number: 09/25/2022, 2:35 PM  Clinical Narrative:  2/11: Provider spoke with family and relayed that they are still considering SNF vs HH.SNF recommended by PT 09/23/22. If SNF is choice: Patient tested Covid + on 09/21/22. PASRR# [id number] A. Simmie Davies RN CM     Expected Discharge Plan: [place] Barriers to Discharge: Continued Medical Work up  Expected Discharge Plan and [place] arrangements for the past 2 months: [place]: [facility]         Social Determinants of Health (SDOH) Interventions SDOH Screenings   Food Insecurity: No Food Insecurity (07/01/2022)  Housing: Low Risk  (07/01/2022)  Transportation Needs: No Transportation Needs (07/01/2022)  Utilities: Not At Risk (07/01/2022)  Alcohol Screen: Low Risk  (09/12/2022)  Depression (PHQ2-9): Low Risk  (09/12/2022)  Financial Resource Strain: Low Risk  (09/07/2021)  Physical Activity: Inactive (09/07/2021)  Social Connections: Moderately Isolated (09/07/2021)  Stress: No Stress Concern Present (09/07/2021)  Tobacco Use: Low Risk  (09/21/2022)    Readmission Risk Interventions     No data to display

## 2022-09-25 NOTE — Assessment & Plan Note (Signed)
Pulse ox of 79% today.  Oxygen supplementation.  1 dose of prednisone.  1 dose of Lasix.  Nebulizers ordered.  New wheezing heard today.

## 2022-09-26 DIAGNOSIS — J9601 Acute respiratory failure with hypoxia: Secondary | ICD-10-CM | POA: Diagnosis not present

## 2022-09-26 LAB — CBC
HCT: 29.8 % — ABNORMAL LOW (ref 36.0–46.0)
Hemoglobin: 9.2 g/dL — ABNORMAL LOW (ref 12.0–15.0)
MCH: 24.5 pg — ABNORMAL LOW (ref 26.0–34.0)
MCHC: 30.9 g/dL (ref 30.0–36.0)
MCV: 79.5 fL — ABNORMAL LOW (ref 80.0–100.0)
Platelets: 37 10*3/uL — ABNORMAL LOW (ref 150–400)
RBC: 3.75 MIL/uL — ABNORMAL LOW (ref 3.87–5.11)
RDW: 20.6 % — ABNORMAL HIGH (ref 11.5–15.5)
WBC: 11.4 10*3/uL — ABNORMAL HIGH (ref 4.0–10.5)
nRBC: 0.2 % (ref 0.0–0.2)

## 2022-09-26 LAB — BASIC METABOLIC PANEL
Anion gap: 9 (ref 5–15)
BUN: 33 mg/dL — ABNORMAL HIGH (ref 8–23)
CO2: 21 mmol/L — ABNORMAL LOW (ref 22–32)
Calcium: 7.5 mg/dL — ABNORMAL LOW (ref 8.9–10.3)
Chloride: 113 mmol/L — ABNORMAL HIGH (ref 98–111)
Creatinine, Ser: 1.19 mg/dL — ABNORMAL HIGH (ref 0.44–1.00)
GFR, Estimated: 42 mL/min — ABNORMAL LOW (ref >60)
Glucose, Bld: 199 mg/dL — ABNORMAL HIGH (ref 70–99)
Potassium: 4 mmol/L (ref 3.5–5.1)
Sodium: 143 mmol/L (ref 135–145)

## 2022-09-26 MED ORDER — IPRATROPIUM BROMIDE HFA 17 MCG/ACT IN AERS
2.0000 | INHALATION_SPRAY | RESPIRATORY_TRACT | Status: DC | PRN
  Administered 2022-09-28: 2 via RESPIRATORY_TRACT
  Filled 2022-09-26: qty 12.9

## 2022-09-26 MED ORDER — IPRATROPIUM-ALBUTEROL 0.5-2.5 (3) MG/3ML IN SOLN
3.0000 mL | Freq: Two times a day (BID) | RESPIRATORY_TRACT | Status: DC
  Filled 2022-09-26: qty 3

## 2022-09-26 MED ORDER — BUDESONIDE 180 MCG/ACT IN AEPB
2.0000 | INHALATION_SPRAY | Freq: Two times a day (BID) | RESPIRATORY_TRACT | Status: DC
  Administered 2022-09-27 – 2022-10-03 (×13): 2 via RESPIRATORY_TRACT
  Filled 2022-09-26 (×2): qty 1

## 2022-09-26 MED ORDER — ALBUTEROL SULFATE HFA 108 (90 BASE) MCG/ACT IN AERS
2.0000 | INHALATION_SPRAY | RESPIRATORY_TRACT | Status: DC | PRN
  Administered 2022-09-27 – 2022-09-29 (×3): 2 via RESPIRATORY_TRACT

## 2022-09-26 NOTE — Plan of Care (Signed)
  Problem: Elimination: Goal: Will not experience complications related to bowel motility 09/26/2022 1112 by Brooke Bonito, RN Outcome: Progressing 09/26/2022 1110 by Brooke Bonito, RN Outcome: Progressing Goal: Will not experience complications related to urinary retention 09/26/2022 1112 by Brooke Bonito, RN Outcome: Progressing 09/26/2022 1110 by Brooke Bonito, RN Outcome: Progressing   Problem: Pain Managment: Goal: General experience of comfort will improve 09/26/2022 1112 by Brooke Bonito, RN Outcome: Progressing 09/26/2022 1110 by Brooke Bonito, RN Outcome: Progressing   Problem: Safety: Goal: Ability to remain free from injury will improve 09/26/2022 1112 by Brooke Bonito, RN Outcome: Progressing 09/26/2022 1110 by Brooke Bonito, RN Outcome: Progressing   Problem: Skin Integrity: Goal: Risk for impaired skin integrity will decrease 09/26/2022 1112 by Brooke Bonito, RN Outcome: Progressing 09/26/2022 1110 by Brooke Bonito, RN Outcome: Progressing   Problem: Education: Goal: Knowledge of disease or condition will improve Outcome: Progressing Goal: Knowledge of secondary prevention will improve (MUST DOCUMENT ALL) Outcome: Progressing Goal: Knowledge of patient specific risk factors will improve Elta [place] N/A or DELETE if not current risk factor) Outcome: Progressing   Problem: Ischemic Stroke/TIA Tissue Perfusion: Goal: Complications of ischemic stroke/TIA will be minimized Outcome: Progressing   Problem: Coping: Goal: Will verbalize positive feelings about self Outcome: Progressing Goal: Will identify appropriate support needs Outcome: Progressing   Problem: Health Behavior/Discharge Planning: Goal: Ability to manage health-related needs will improve Outcome: Progressing Goal: Goals will be collaboratively established with patient/family Outcome: Progressing   Problem: Self-Care: Goal: Ability to participate in self-care as condition permits will improve Outcome:  Progressing Goal: Verbalization of feelings and concerns over difficulty with self-care will improve Outcome: Progressing Goal: Ability to communicate needs accurately will improve Outcome: Progressing   Problem: Nutrition: Goal: Risk of aspiration will decrease Outcome: Progressing Goal: Dietary intake will improve Outcome: Progressing

## 2022-09-26 NOTE — Progress Notes (Signed)
Occupational Therapy Treatment Patient Details Name: Gabriela [name] MRN: [id number] DOB: 05/11/1926 Today's Date: 09/26/2022   History of present illness 89 y.o. female with medical history significant for HTN, CAD, CKD 3a, DM, MGUS who presents to the ED by EMS with concerns for 3-day history of weakness, confusion, slurred speech and dropping things. CT negative, MRI pending. Now Covid+.   OT comments  Pt received seated EOB; O2 on 1L, saturation 100%; pt in no distress. Appearing alert; willing to work with OT on trying to scoot. T/f with scooting approx 2 feet towards HOB with MAX A from OT; pt with difficulty achieving any lift of buttocks off the bed. See flowsheet below for further details of session. Left seated EOB with all needs in reach.  Patient will benefit from continued OT while in acute care.    Recommendations for follow up therapy are one component of a multi-disciplinary discharge planning process, led by the attending physician.  Recommendations may be updated based on patient status, additional functional criteria and insurance authorization.    Follow Up Recommendations  Skilled nursing-short term rehab (<3 hours/day)     Assistance Recommended at Discharge Frequent or constant Supervision/Assistance  Patient can return home with the following  Two people to help with walking and/or transfers;A lot of help with bathing/dressing/bathroom;Assistance with cooking/housework;Direct supervision/assist for medications management;Direct supervision/assist for financial management;Assist for transportation;Help with stairs or ramp for entrance   Equipment Recommendations  Other (comment) (defer to next venue of care; per case management, family considering home instead of SNF rehab; if pt returns home will need assist for all ADLs and transfers, as well as dependent lift.)    Recommendations for Other Services      Precautions / Restrictions Precautions Precautions:  Fall Precaution Comments: isolation (airborne for Covid+) Restrictions Weight Bearing Restrictions: No       Mobility Bed Mobility               General bed mobility comments: Pt received seated EOB today. Did not return to supine. Attempted scooting at EOB; pt requiring MAX A of OT (with use of chuck pad) for scooting EOB. Defer to PT to try slideboard later given pt's weakness with scooting EOB. Scooted approx 2 feet towards HOB. Good sitting balance at EOB.    Transfers                  Lateral/Scoot Transfers: Max assist General transfer comment: Scoot along EOB only; did not t/f to chair.     Balance   Sitting-balance support: Feet supported Sitting balance-Leahy Scale: Good                                     ADL either performed or assessed with clinical judgement   ADL       Grooming: Set up;Oral care Grooming Details (indicate cue type and reason): seated at EOB for oral care today                                    Extremity/Trunk Assessment Upper Extremity Assessment Upper Extremity Assessment: Generalized weakness   Lower Extremity Assessment Lower Extremity Assessment: Defer to PT evaluation;Generalized weakness RLE Deficits / Details: history of surgery on that leg (hip) and leg is internally rotated, granddaughter reports foot drop on that side (chronic)  Vision       Perception     Praxis      Cognition Arousal/Alertness: Awake/alert Behavior During Therapy: WFL for tasks assessed/performed Overall Cognitive Status: Within Functional Limits for tasks assessed                                          Exercises      Shoulder Instructions       General Comments Pt stating she does not feel short of breath, but does need breaks during scooting. On 1L o2 with saturation 100% during session.    Pertinent Vitals/ Pain       Pain Assessment Pain Assessment: No/denies  pain  Home Living                                          Prior Functioning/Environment              Frequency  Min 2X/week        Progress Toward Goals  OT Goals(current goals can now be found in the care plan section)  Progress towards OT goals: Progressing toward goals  Acute Rehab OT Goals Patient Stated Goal: Get better OT Goal Formulation: With patient Time For Goal Achievement: 10/07/22 Potential to Achieve Goals: Fair ADL Goals Pt Will Perform Grooming: with modified independence;sitting Pt Will Perform Upper Body Bathing: with min assist;bed level  Plan Discharge plan remains appropriate    Co-evaluation                 AM-PAC OT "6 Clicks" Daily Activity     Outcome Measure   Help from another person eating meals?: None Help from another person taking care of personal grooming?: None Help from another person toileting, which includes using toliet, bedpan, or urinal?: Total Help from another person bathing (including washing, rinsing, drying)?: Total Help from another person to put on and taking off regular upper body clothing?: A Little Help from another person to put on and taking off regular lower body clothing?: Total 6 Click Score: 14    End of Session Equipment Utilized During Treatment: Other (comment);Oxygen (chuck pad)  OT Visit Diagnosis: Unsteadiness on feet (R26.81);Other abnormalities of gait and mobility (R26.89);Muscle weakness (generalized) (M62.81)   Activity Tolerance Patient limited by fatigue   Patient Left in bed;with call bell/phone within reach;with bed alarm set (pt seated EOB, blankets around her and on legs; new water provided per her request)   Nurse Communication Mobility status        Time: 1039-1100 OT Time Calculation (min): 21 min  Charges: OT General Charges $OT Visit: 1 Visit OT Treatments $Self Care/Home Management : 8-22 mins  Waymon Amato, MS, OTR/L   Vania Rea 09/26/2022, 1:01 PM

## 2022-09-26 NOTE — Progress Notes (Signed)
PROGRESS NOTE    Nena Maxson Seth  [id number] DOB: [birth year] DOA: 09/21/2022  PCP: [name], MD   Brief Narrative:  This 89 y.o. female with PMH significant for HTN, CAD, CKD 3a, DM, MGUS who presents to the ED by EMS with  3-day history of weakness, confusion, slurred speech and dropping things.  Most of the history is given by the granddaughter at the bedside who stated that she noticed that her grandmother has increasingly worsening swelling of the bilateral lower extremities up to the thighs in spite of taking her lasix 10 mg daily.  She has no prior history of CHF and states she was prescribed Lasix by her PCP.  She took her to the nephrologist on 09/15/22 who changed  Lasix to torsemide and ordered compression socks.  She stated that since the medication was changed, her grandmother continue to have swelling and seem to be weaker and she also has developed a cough that has become increasingly more congested.  She is not short of breath,  at baseline she does not move around a lot.  ED course and data review: Afebrile with unremarkable vitals. Labs significant for troponin of 60 and BNP 4115.  WBC 11,500 with hemoglobin 8.8, platelets 51,000. Creatinine 1.57 above baseline of 0.95 a month ago, respiratory viral panel positive for COVID.   Chest x-ray showing bilateral pleural effusions with basilar atelectasis. MRI ordered to evaluate for stroke.  Hospitalist consulted for admission. MRI showed a 7 mm acute ischemic nonhemorrhagic right cerebral infarct.   Assessment & Plan:   Principal Problem:   Acute hypoxic respiratory failure (HCC) Active Problems:   Acute stroke due to ischemia (HCC)   Chronic systolic CHF (congestive heart failure) (HCC)   COVID-19   Acute renal failure superimposed on stage 3a chronic kidney disease (HCC)   Bilateral pleural effusion   Acute metabolic encephalopathy   Generalized weakness   Essential (primary) hypertension   Arthritis,  degenerative   MGUS (monoclonal gammopathy of unknown significance)   Type 2 diabetes mellitus with circulatory disorder (HCC)   Myocardial injury  Acute hypoxic respiratory failure: Improved.  SpO2  93% on 2 L.   Continue Oxygen supplementation.   Likely secondary to COVID-19.  Given prednisone and Lasix. Continue nebulized bronchodilators.   Acute stroke due to ischemia: Right cerebellar area.  I do not think she needs dual antiplatelet agent at this time given low platelet count. Currently on Plavix.  As per the neurology benefits of statin are outweighed by the risks of liver damage and myopathy.   Chronic systolic CHF: Last LVEF [id number].  Continue Lasix 20 mg daily. Appears euvolemic.   COVID-19: Continue supportive care. Continue airborne precautions.   Bilateral pleural effusion: Continue to monitor, shortness of breath is improved.   AKI on CKD stage IIIa: Creatinine 1.57 on arrival, above baseline of 0.95 a month prior.   Creatinine 1.19 today.  Resumed Lasix and watch creatinine.   Acute metabolic encephalopathy; Resolved.  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 As per patient's granddaughter, they will take her home if she does better with board transfers.  Otherwise they will consider rehab.   Elevated troponin: Troponin slightly elevated but we can get this with a stroke also.   Type 2 diabetes mellitus: With her hands and feet being cold I do not think the  fingersticks are accurate at this time.   Hb A1c only 6.8.  Will continue to watch sugars with daily chemistry.   MGUS (monoclonal gammopathy of unknown significance) Anemia and thrombocytopenia: Last hemoglobin 10.2 and last platelet count 41. Continue  to monitor.   Osteoarthritis: Continue Tylenol and Robaxin with tramadol as  needed.   Essential hypertension: Continue metoprolol.   DVT prophylaxis:  SCDs Code Status: DNR Family Communication: No family at bed side Disposition Plan:    Status is: Inpatient Remains inpatient appropriate because: Admitted for acute hypoxic respiratory failure in the setting of COVID-19 infection.  PT and OT recommended SNF but family wants to take her home with home health services.  Patient has 24-hour care.    Consultants:  None  Procedures:None  Antimicrobials:  Anti-infectives (From admission, onward)    Start     Dose/Rate Route Frequency Ordered Stop   09/23/22 1000  remdesivir 100 mg in sodium chloride 0.9 % 100 mL IVPB  Status:  Discontinued       See Hyperspace for full Linked Orders Report.   100 mg 200 mL/hr over 30 Minutes Intravenous Daily 09/22/22 0000 09/22/22 0840   09/22/22 0430  remdesivir 100 mg in sodium chloride 0.9 % 100 mL IVPB       See Hyperspace for full Linked Orders Report.   100 mg 200 mL/hr over 30 Minutes Intravenous  Once 09/22/22 0000 09/23/22 0700   09/22/22 0400  remdesivir 100 mg in sodium chloride 0.9 % 100 mL IVPB       See Hyperspace for full Linked Orders Report.   100 mg 200 mL/hr over 30 Minutes Intravenous  Once 09/22/22 0000 09/22/22 0456      Subjective: Patient was seen and examined at bedside.  Overnight events noted.   Patient was doing physical therapy,  She reports feeling weak but better and she wants to go home.  Objective: Vitals:   09/25/22 2344 09/26/22 0458 09/26/22 0903 09/26/22 0928  BP: (!) 119/46 (!) 119/48  (!) 150/64  Pulse: 69 68  80  Resp: 16 20  (!) 21  Temp: (!) 97.5 F (36.4 C) 97.7 F (36.5 C)    TempSrc: Oral Oral    SpO2: 96% 100%  93%  Weight:   60.8 kg     Intake/Output Summary (Last 24 hours) at 09/26/2022 1135 Last data filed at 09/26/2022 1000 Gross per 24 hour  Intake 1440 ml  Output 950 ml  Net 490 ml   Filed Weights   09/24/22 0758 09/25/22 0837 09/26/22 0903  Weight:  59.9 kg 58.4 kg 60.8 kg    Examination:  General exam: Appears calm and comfortable, not in any acute distress. Respiratory system: Clear to auscultation. Respiratory effort normal.  RR 16. Cardiovascular system: S1 & S2 heard, regular rate and rhythm, no murmur. Gastrointestinal system: Abdomen is soft, non tender, nondistended, BS+ Central nervous system: Alert and oriented x 3. No focal neurological deficits. Extremities: No edema, no cyanosis, no clubbing Skin: No rashes, lesions or ulcers Psychiatry: Judgement and insight appear normal. Mood & affect appropriate.     Data Reviewed: I have personally reviewed following labs and imaging studies  CBC: Recent Labs  Lab 09/21/22 2033 09/22/22 0255 09/24/22 0529 09/26/22 0632  WBC 11.5* 9.9 7.6 11.4*  NEUTROABS 7.3  --   --   --   HGB 8.8* 8.9* 10.2* 9.2*  HCT 29.5* 29.3* 33.4* 29.8*  MCV 83.3 82.3 81.9 79.5*  PLT 51* 44*  41* 37*   Basic Metabolic Panel: Recent Labs  Lab 09/21/22 2033 09/22/22 0255 09/23/22 1211 09/24/22 0529 09/25/22 0640 09/26/22 0421  NA 142 143  --  139 145 143  K 4.2 4.1  --  4.0 3.5 4.0  CL 112* 113*  --  106 115* 113*  CO2 22 20*  --  25 25 21*  GLUCOSE 140* 118* 132* 156* 98 199*  BUN 31* 31*  --  35* 31* 33*  CREATININE 1.57* 1.51*  --  1.45* 1.17* 1.19*  CALCIUM 8.0* 7.7*  --  8.1* 7.9* 7.5*   GFR: Estimated Creatinine Clearance: 25 mL/min (A) (by C-G formula based on SCr of 1.19 mg/dL (H)). Liver Function Tests: Recent Labs  Lab 09/21/22 2033  AST 22  ALT 15  ALKPHOS 131*  BILITOT 1.5*  PROT 6.8  ALBUMIN 2.8*   No results for input(s): "LIPASE", "AMYLASE" in the last 168 hours. No results for input(s): "AMMONIA" in the last 168 hours. Coagulation Profile: No results for input(s): "INR", "PROTIME" in the last 168 hours. Cardiac Enzymes: No results for input(s): "CKTOTAL", "CKMB", "CKMBINDEX", "TROPONINI" in the last 168 hours. BNP (last 3 results) No results for input(s):  "PROBNP" in the last 8760 hours. HbA1C: No results for input(s): "HGBA1C" in the last 72 hours. CBG: Recent Labs  Lab 09/23/22 0859 09/23/22 1018 09/23/22 1656 09/23/22 2127 09/24/22 2125  GLUCAP 85 30* 140* 231* 183*   Lipid Profile: Recent Labs    09/25/22 0640  CHOL 79  HDL 25*  LDLCALC 40  TRIG 72  CHOLHDL 3.2   Thyroid Function Tests: No results for input(s): "TSH", "T4TOTAL", "FREET4", "T3FREE", "THYROIDAB" in the last 72 hours. Anemia Panel: No results for input(s): "VITAMINB12", "FOLATE", "FERRITIN", "TIBC", "IRON", "RETICCTPCT" in the last 72 hours. Sepsis Labs: No results for input(s): "PROCALCITON", "LATICACIDVEN" in the last 168 hours.  Recent Results (from the past 240 hour(s))  Resp panel by RT-PCR (RSV, Flu A&B, Covid) Anterior Nasal Swab     Status: Abnormal   Collection Time: 09/21/22  8:33 PM   Specimen: Anterior Nasal Swab  Result Value Ref Range Status   SARS Coronavirus 2 by RT PCR POSITIVE (A) NEGATIVE Final    Comment: (NOTE) SARS-CoV-2 target nucleic acids are DETECTED.  The SARS-CoV-2 RNA is generally detectable in upper respiratory specimens during the acute phase of infection. Positive results are indicative of the presence of the identified virus, but do not rule out bacterial infection or co-infection with other pathogens not detected by the test. Clinical correlation with patient history and other diagnostic information is necessary to determine patient infection status. The expected result is Negative.  Fact Sheet for Patients: EntrepreneurPulse.com.au  Fact Sheet for Healthcare Providers: IncredibleEmployment.be  This test is not yet approved or cleared by the [place] FDA and  has been authorized for detection and/or diagnosis of SARS-CoV-2 by FDA under an Emergency Use Authorization (EUA).  This EUA will remain in effect (meaning this test can be used) for the duration of  the COVID-19  declaration under Section 564(b)(1) of the A ct, 21 U.S.C. section 360bbb-3(b)(1), unless the authorization is terminated or revoked sooner.     Influenza A by PCR NEGATIVE NEGATIVE Final   Influenza B by PCR NEGATIVE NEGATIVE Final    Comment: (NOTE) The Xpert Xpress SARS-CoV-2/FLU/RSV plus assay is intended as an aid in the diagnosis of influenza from Nasopharyngeal swab specimens and should not be used as a sole basis for treatment. Nasal  washings and aspirates are unacceptable for Xpert Xpress SARS-CoV-2/FLU/RSV testing.  Fact Sheet for Patients: EntrepreneurPulse.com.au  Fact Sheet for Healthcare Providers: IncredibleEmployment.be  This test is not yet approved or cleared by the [place] FDA and has been authorized for detection and/or diagnosis of SARS-CoV-2 by FDA under an Emergency Use Authorization (EUA). This EUA will remain in effect (meaning this test can be used) for the duration of the COVID-19 declaration under Section 564(b)(1) of the Act, 21 U.S.C. section 360bbb-3(b)(1), unless the authorization is terminated or revoked.     Resp Syncytial Virus by PCR NEGATIVE NEGATIVE Final    Comment: (NOTE) Fact Sheet for Patients: EntrepreneurPulse.com.au  Fact Sheet for Healthcare Providers: IncredibleEmployment.be  This test is not yet approved or cleared by the [place] FDA and has been authorized for detection and/or diagnosis of SARS-CoV-2 by FDA under an Emergency Use Authorization (EUA). This EUA will remain in effect (meaning this test can be used) for the duration of the COVID-19 declaration under Section 564(b)(1) of the Act, 21 U.S.C. section 360bbb-3(b)(1), unless the authorization is terminated or revoked.  Performed at [facility], [address], [place], [place] [address]          Radiology Studies: [facility] Chest [place] 1 View  Result Date:  09/25/2022 CLINICAL DATA:  Cough. EXAM: PORTABLE CHEST 1 VIEW COMPARISON:  09/23/2022 FINDINGS: The cardio pericardial silhouette is enlarged. Bibasilar collapse/consolidation is associated with small to moderate bilateral pleural effusions. Prominent skin fold noted over the left lung. Status post cardiac valve replacement and CABG. Telemetry leads overlie the chest. IMPRESSION: Bibasilar collapse/consolidation with small to moderate bilateral pleural effusions. Electronically Signed   By: Misty Stanley M.D.   On: 09/25/2022 08:26     Scheduled Meds:  brinzolamide  1 drop Both Eyes BID   And   brimonidine  1 drop Both Eyes BID   budesonide (PULMICORT) nebulizer solution  0.25 mg Nebulization BID   clopidogrel  75 mg Oral Daily   feeding supplement (GLUCERNA SHAKE)  237 mL Oral TID BM   ferrous sulfate  325 mg Oral Q breakfast   furosemide  20 mg Oral Daily   ipratropium-albuterol  3 mL Nebulization BID   metoprolol succinate  12.5 mg Oral Daily   potassium chloride  20 mEq Oral Daily   sodium bicarbonate  650 mg Oral BID   sodium chloride flush  10-40 mL Intracatheter Q12H   timolol  1 drop Both Eyes BID   Continuous Infusions:   LOS: 5 days    Time spent: 50 min    Tia Gelb, MD [facility]   If 7PM-7AM, please contact night-coverage

## 2022-09-26 NOTE — Plan of Care (Signed)
  Problem: Education: Goal: Ability to demonstrate management of disease process will improve Outcome: Progressing Goal: Ability to verbalize understanding of medication therapies will improve Outcome: Progressing Goal: Individualized Educational Video(s) Outcome: Progressing   Problem: Activity: Goal: Capacity to carry out activities will improve Outcome: Progressing   Problem: Cardiac: Goal: Ability to achieve and maintain adequate cardiopulmonary perfusion will improve Outcome: Progressing   Problem: Education: Goal: Knowledge of risk factors and measures for prevention of condition will improve Outcome: Progressing   Problem: Coping: Goal: Psychosocial and spiritual needs will be supported Outcome: Progressing   Problem: Respiratory: Goal: Will maintain a patent airway Outcome: Progressing Goal: Complications related to the disease process, condition or treatment will be avoided or minimized Outcome: Progressing   Problem: Education: Goal: Knowledge of General Education information will improve Description: Including pain rating scale, medication(s)/side effects and non-pharmacologic comfort measures Outcome: Progressing   Problem: Health Behavior/Discharge Planning: Goal: Ability to manage health-related needs will improve Outcome: Progressing   Problem: Clinical Measurements: Goal: Ability to maintain clinical measurements within normal limits will improve Outcome: Progressing Goal: Will remain free from infection Outcome: Progressing Goal: Diagnostic test results will improve Outcome: Progressing Goal: Respiratory complications will improve Outcome: Progressing Goal: Cardiovascular complication will be avoided Outcome: Progressing   Problem: Activity: Goal: Risk for activity intolerance will decrease Outcome: Progressing   Problem: Nutrition: Goal: Adequate nutrition will be maintained Outcome: Progressing   Problem: Coping: Goal: Level of anxiety  will decrease Outcome: Progressing   Problem: Elimination: Goal: Will not experience complications related to bowel motility Outcome: Progressing Goal: Will not experience complications related to urinary retention Outcome: Progressing   Problem: Pain Managment: Goal: General experience of comfort will improve Outcome: Progressing   Problem: Safety: Goal: Ability to remain free from injury will improve Outcome: Progressing   Problem: Skin Integrity: Goal: Risk for impaired skin integrity will decrease Outcome: Progressing

## 2022-09-27 DIAGNOSIS — J9601 Acute respiratory failure with hypoxia: Secondary | ICD-10-CM | POA: Diagnosis not present

## 2022-09-27 LAB — BASIC METABOLIC PANEL
Anion gap: 9 (ref 5–15)
BUN: 34 mg/dL — ABNORMAL HIGH (ref 8–23)
CO2: 26 mmol/L (ref 22–32)
Calcium: 8.1 mg/dL — ABNORMAL LOW (ref 8.9–10.3)
Chloride: 112 mmol/L — ABNORMAL HIGH (ref 98–111)
Creatinine, Ser: 1.14 mg/dL — ABNORMAL HIGH (ref 0.44–1.00)
GFR, Estimated: 45 mL/min — ABNORMAL LOW (ref >60)
Glucose, Bld: 127 mg/dL — ABNORMAL HIGH (ref 70–99)
Potassium: 4.7 mmol/L (ref 3.5–5.1)
Sodium: 147 mmol/L — ABNORMAL HIGH (ref 135–145)

## 2022-09-27 LAB — CBC
HCT: 27.4 % — ABNORMAL LOW (ref 36.0–46.0)
Hemoglobin: 8.4 g/dL — ABNORMAL LOW (ref 12.0–15.0)
MCH: 24.4 pg — ABNORMAL LOW (ref 26.0–34.0)
MCHC: 30.7 g/dL (ref 30.0–36.0)
MCV: 79.7 fL — ABNORMAL LOW (ref 80.0–100.0)
Platelets: 39 10*3/uL — ABNORMAL LOW (ref 150–400)
RBC: 3.44 MIL/uL — ABNORMAL LOW (ref 3.87–5.11)
RDW: 20.7 % — ABNORMAL HIGH (ref 11.5–15.5)
WBC: 14.3 10*3/uL — ABNORMAL HIGH (ref 4.0–10.5)
nRBC: 0.1 % (ref 0.0–0.2)

## 2022-09-27 LAB — MAGNESIUM: Magnesium: 1.9 mg/dL (ref 1.7–2.4)

## 2022-09-27 LAB — PHOSPHORUS: Phosphorus: 2.8 mg/dL (ref 2.5–4.6)

## 2022-09-27 MED ORDER — PREDNISONE 20 MG PO TABS
40.0000 mg | ORAL_TABLET | Freq: Every day | ORAL | Status: AC
  Administered 2022-09-27 – 2022-09-29 (×3): 40 mg via ORAL
  Filled 2022-09-27 (×3): qty 2

## 2022-09-27 NOTE — Progress Notes (Signed)
PROGRESS NOTE    Gabriela [name]  [id number] DOB: 14-Feb-1926 DOA: 09/21/2022  PCP: [name], MD   Brief Narrative:  This 89 y.o. female with PMH significant for HTN, CAD, CKD 3a, DM, MGUS who presents to the ED by EMS with  3-day history of weakness, confusion, slurred speech and dropping things.  Most of the history is given by the granddaughter at the bedside who stated that she noticed that her grandmother has increasingly worsening swelling of the bilateral lower extremities up to the thighs in spite of taking her lasix 10 mg daily.  She has no prior history of CHF and states she was prescribed Lasix by her PCP.  She took her to the nephrologist on 09/15/22 who changed  Lasix to torsemide and ordered compression socks.  She stated that since the medication was changed, her grandmother continue to have swelling and seem to be weaker and she also has developed a cough that has become increasingly more congested.  She is not short of breath,  at baseline she does not move around a lot.  ED course and data review: Afebrile with unremarkable vitals. Labs significant for troponin of 60 and BNP 4115.  WBC 11,500 with hemoglobin 8.8, platelets 51,000. Creatinine 1.57 above baseline of 0.95 a month ago, respiratory viral panel positive for COVID.   Chest x-ray showing bilateral pleural effusions with basilar atelectasis. MRI ordered to evaluate for stroke.  Hospitalist consulted for admission. MRI showed a 7 mm acute ischemic nonhemorrhagic right cerebral infarct.   Assessment & Plan:   Principal Problem:   Acute hypoxic respiratory failure (HCC) Active Problems:   Acute stroke due to ischemia (HCC)   Chronic systolic CHF (congestive heart failure) (HCC)   COVID-19   Acute renal failure superimposed on stage 3a chronic kidney disease (HCC)   Bilateral pleural effusion   Acute metabolic encephalopathy   Generalized weakness   Essential (primary) hypertension   Arthritis,  degenerative   MGUS (monoclonal gammopathy of unknown significance)   Type 2 diabetes mellitus with circulatory disorder (HCC)   Myocardial injury  Acute hypoxic respiratory failure: Improved.  SpO2  93% on 2 L.   Continue Oxygen supplementation.   Likely secondary to COVID-19.  Continue prednisone and Lasix. Continue nebulized bronchodilators.   Acute stroke due to ischemia: Right cerebellar area.  I do not think she needs dual antiplatelet agent at this time given low platelet count. Currently on Plavix.  As per the neurology benefits of statin are outweighed by the risks of liver damage and myopathy.   Chronic systolic CHF: Last LVEF [id number].  Continue Lasix 20 mg daily. Appears euvolemic.   COVID-19: Continue supportive care. Continue airborne precautions.   Bilateral pleural effusion: Continue to monitor, shortness of breath has improved.   AKI on CKD stage IIIa: Creatinine 1.57 on arrival, above baseline of 0.95 a month prior.   Creatinine 1.14 today.  Resumed Lasix and watch creatinine.   Acute metabolic encephalopathy; Resolved.  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 As per patient's granddaughter, they will take her home if she does better with board transfers.  Otherwise they will consider rehab.   Elevated troponin: Troponin slightly elevated but we can get this with a stroke also.   Type 2 diabetes mellitus: With her hands and feet being cold I do not think the  fingersticks are accurate at this time.   Hb A1c only 6.8.  Will continue to watch sugars with daily chemistry.   MGUS (monoclonal gammopathy of unknown significance) Anemia and thrombocytopenia: Last hemoglobin 10.2 and last platelet count 41. Continue  to monitor.   Osteoarthritis: Continue Tylenol and Robaxin with tramadol  as needed.   Essential hypertension: Continue metoprolol.   DVT prophylaxis:  SCDs Code Status: DNR Family Communication: No family at bed side Disposition Plan:    Status is: Inpatient Remains inpatient appropriate because: Admitted for acute hypoxic respiratory failure in the setting of COVID-19 infection.  PT and OT recommended SNF but family wants to take her home with home health services.  Patient has 24-hour care.    Consultants:  None  Procedures:None  Antimicrobials:  Anti-infectives (From admission, onward)    Start     Dose/Rate Route Frequency Ordered Stop   09/23/22 1000  remdesivir 100 mg in sodium chloride 0.9 % 100 mL IVPB  Status:  Discontinued       See Hyperspace for full Linked Orders Report.   100 mg 200 mL/hr over 30 Minutes Intravenous Daily 09/22/22 0000 09/22/22 0840   09/22/22 0430  remdesivir 100 mg in sodium chloride 0.9 % 100 mL IVPB       See Hyperspace for full Linked Orders Report.   100 mg 200 mL/hr over 30 Minutes Intravenous  Once 09/22/22 0000 09/23/22 0700   09/22/22 0400  remdesivir 100 mg in sodium chloride 0.9 % 100 mL IVPB       See Hyperspace for full Linked Orders Report.   100 mg 200 mL/hr over 30 Minutes Intravenous  Once 09/22/22 0000 09/22/22 0456      Subjective: Patient was seen and examined at bedside.  Overnight events noted.   Patient reports feeling weak but better and wants to go home.   She wants to participate with physical therapy.  Objective: Vitals:   09/27/22 0356 09/27/22 0859 09/27/22 1214 09/27/22 1216  BP: (!) 164/90 (!) 154/67 (!) 153/70 (!) 151/76  Pulse: 73 75 64 73  Resp: 17 (!) 22  14  Temp: 98 F (36.7 C)  97.6 F (36.4 C) (!) 97.3 F (36.3 C)  TempSrc: Oral Oral Oral   SpO2: 98% 96% 95% 100%  Weight:        Intake/Output Summary (Last 24 hours) at 09/27/2022 1335 Last data filed at 09/27/2022 0900 Gross per 24 hour  Intake 480 ml  Output 350 ml  Net 130 ml   Filed Weights   09/24/22  0758 09/25/22 0837 09/26/22 0903  Weight: 59.9 kg 58.4 kg 60.8 kg    Examination:  General exam: Appears calm and comfortable, not in any acute distress. Respiratory system: CTA bilaterally, Respiratory effort normal.  RR 16. Cardiovascular system: S1 & S2 heard, regular rate and rhythm, no murmur. Gastrointestinal system: Abdomen is soft, non tender, nondistended, BS+ Central nervous system: Alert and oriented x 3. No focal neurological deficits. Extremities: No edema, no cyanosis, no clubbing Skin: No rashes, lesions or ulcers Psychiatry: Judgement and insight appear normal. Mood & affect appropriate.     Data Reviewed: I have personally reviewed following labs and imaging studies  CBC: Recent Labs  Lab 09/21/22 2033 09/22/22 0255 09/24/22 0529 09/26/22 0632 09/27/22 0546  WBC 11.5* 9.9 7.6 11.4* 14.3*  NEUTROABS 7.3  --   --   --   --   HGB 8.8* 8.9* 10.2* 9.2* 8.4*  HCT 29.5* 29.3* 33.4*  29.8* 27.4*  MCV 83.3 82.3 81.9 79.5* 79.7*  PLT 51* 44* 41* 37* 39*   Basic Metabolic Panel: Recent Labs  Lab 09/22/22 0255 09/23/22 1211 09/24/22 0529 09/25/22 0640 09/26/22 0421 09/27/22 0546  NA 143  --  139 145 143 147*  K 4.1  --  4.0 3.5 4.0 4.7  CL 113*  --  106 115* 113* 112*  CO2 20*  --  25 25 21* 26  GLUCOSE 118* 132* 156* 98 199* 127*  BUN 31*  --  35* 31* 33* 34*  CREATININE 1.51*  --  1.45* 1.17* 1.19* 1.14*  CALCIUM 7.7*  --  8.1* 7.9* 7.5* 8.1*  MG  --   --   --   --   --  1.9  PHOS  --   --   --   --   --  2.8   GFR: Estimated Creatinine Clearance: 26.1 mL/min (A) (by C-G formula based on SCr of 1.14 mg/dL (H)). Liver Function Tests: Recent Labs  Lab 09/21/22 2033  AST 22  ALT 15  ALKPHOS 131*  BILITOT 1.5*  PROT 6.8  ALBUMIN 2.8*   No results for input(s): "LIPASE", "AMYLASE" in the last 168 hours. No results for input(s): "AMMONIA" in the last 168 hours. Coagulation Profile: No results for input(s): "INR", "PROTIME" in the last 168  hours. Cardiac Enzymes: No results for input(s): "CKTOTAL", "CKMB", "CKMBINDEX", "TROPONINI" in the last 168 hours. BNP (last 3 results) No results for input(s): "PROBNP" in the last 8760 hours. HbA1C: No results for input(s): "HGBA1C" in the last 72 hours. CBG: Recent Labs  Lab 09/23/22 0859 09/23/22 1018 09/23/22 1656 09/23/22 2127 09/24/22 2125  GLUCAP 85 30* 140* 231* 183*   Lipid Profile: Recent Labs    09/25/22 0640  CHOL 79  HDL 25*  LDLCALC 40  TRIG 72  CHOLHDL 3.2   Thyroid Function Tests: No results for input(s): "TSH", "T4TOTAL", "FREET4", "T3FREE", "THYROIDAB" in the last 72 hours. Anemia Panel: No results for input(s): "VITAMINB12", "FOLATE", "FERRITIN", "TIBC", "IRON", "RETICCTPCT" in the last 72 hours. Sepsis Labs: No results for input(s): "PROCALCITON", "LATICACIDVEN" in the last 168 hours.  Recent Results (from the past 240 hour(s))  Resp panel by RT-PCR (RSV, Flu A&B, Covid) Anterior Nasal Swab     Status: Abnormal   Collection Time: 09/21/22  8:33 PM   Specimen: Anterior Nasal Swab  Result Value Ref Range Status   SARS Coronavirus 2 by RT PCR POSITIVE (A) NEGATIVE Final    Comment: (NOTE) SARS-CoV-2 target nucleic acids are DETECTED.  The SARS-CoV-2 RNA is generally detectable in upper respiratory specimens during the acute phase of infection. Positive results are indicative of the presence of the identified virus, but do not rule out bacterial infection or co-infection with other pathogens not detected by the test. Clinical correlation with patient history and other diagnostic information is necessary to determine patient infection status. The expected result is Negative.  Fact Sheet for Patients: EntrepreneurPulse.com.au  Fact Sheet for Healthcare Providers: IncredibleEmployment.be  This test is not yet approved or cleared by the [place] FDA and  has been authorized for detection and/or diagnosis  of SARS-CoV-2 by FDA under an Emergency Use Authorization (EUA).  This EUA will remain in effect (meaning this test can be used) for the duration of  the COVID-19 declaration under Section 564(b)(1) of the A ct, 21 U.S.C. section 360bbb-3(b)(1), unless the authorization is terminated or revoked sooner.     Influenza A by  PCR NEGATIVE NEGATIVE Final   Influenza B by PCR NEGATIVE NEGATIVE Final    Comment: (NOTE) The Xpert Xpress SARS-CoV-2/FLU/RSV plus assay is intended as an aid in the diagnosis of influenza from Nasopharyngeal swab specimens and should not be used as a sole basis for treatment. Nasal washings and aspirates are unacceptable for Xpert Xpress SARS-CoV-2/FLU/RSV testing.  Fact Sheet for Patients: EntrepreneurPulse.com.au  Fact Sheet for Healthcare Providers: IncredibleEmployment.be  This test is not yet approved or cleared by the [place] FDA and has been authorized for detection and/or diagnosis of SARS-CoV-2 by FDA under an Emergency Use Authorization (EUA). This EUA will remain in effect (meaning this test can be used) for the duration of the COVID-19 declaration under Section 564(b)(1) of the Act, 21 U.S.C. section 360bbb-3(b)(1), unless the authorization is terminated or revoked.     Resp Syncytial Virus by PCR NEGATIVE NEGATIVE Final    Comment: (NOTE) Fact Sheet for Patients: EntrepreneurPulse.com.au  Fact Sheet for Healthcare Providers: IncredibleEmployment.be  This test is not yet approved or cleared by the [place] FDA and has been authorized for detection and/or diagnosis of SARS-CoV-2 by FDA under an Emergency Use Authorization (EUA). This EUA will remain in effect (meaning this test can be used) for the duration of the COVID-19 declaration under Section 564(b)(1) of the Act, 21 U.S.C. section 360bbb-3(b)(1), unless the authorization is terminated  or revoked.  Performed at [facility], [address], [place], [place] [address]     Radiology Studies: No results found.  Scheduled Meds:  brinzolamide  1 drop Both Eyes BID   And   brimonidine  1 drop Both Eyes BID   budesonide  2 puff Inhalation BID   clopidogrel  75 mg Oral Daily   feeding supplement (GLUCERNA SHAKE)  237 mL Oral TID BM   ferrous sulfate  325 mg Oral Q breakfast   furosemide  20 mg Oral Daily   metoprolol succinate  12.5 mg Oral Daily   potassium chloride  20 mEq Oral Daily   predniSONE  40 mg Oral Daily   sodium chloride flush  10-40 mL Intracatheter Q12H   timolol  1 drop Both Eyes BID   Continuous Infusions:   LOS: 6 days    Time spent: 35 min    Ashey Tramontana, MD [facility]   If 7PM-7AM, please contact night-coverage

## 2022-09-27 NOTE — Progress Notes (Signed)
Physical Therapy Treatment Patient Details Name: Gabriela [name] MRN: [id number] DOB: 1926/05/26 Today's Date: 09/27/2022   History of Present Illness Pt is a 89 y.o. female with medical history significant for HTN, CAD, CKD 3a, DM, MGUS who presents to the ED by EMS with concerns for 3-day history of weakness, confusion, slurred speech and dropping things. MD assessment includes: Acute hypoxic respiratory failure with COVID-19, acute stroke due to ischemia, AKI, and generalized weakness.    PT Comments    Pt was pleasant and motivated to participate during the session and put forth good effort throughout. Pt participated in multiple sliding board transfers from chair to/from bed and multiple sit to/from stand transfers from recliner.  Pt required extensive +2 physical assistance and cuing for sequencing with all transfers per below demonstrating severe functional weakness.  Pt's SpO2 was in the mid 90s on supplemental O2 during the session.  Pt will benefit from PT services in a SNF setting upon discharge to safely address deficits listed in patient problem list for decreased caregiver assistance and eventual return to PLOF.      Recommendations for follow up therapy are one component of a multi-disciplinary discharge planning process, led by the attending physician.  Recommendations may be updated based on patient status, additional functional criteria and insurance authorization.  Follow Up Recommendations  Skilled nursing-short term rehab (<3 hours/day) Can patient physically be transported by private vehicle: No   Assistance Recommended at Discharge Frequent or constant Supervision/Assistance  Patient can return home with the following Assist for transportation;Direct supervision/assist for medications management;Assistance with cooking/housework;Two people to help with walking and/or transfers;A lot of help with bathing/dressing/bathroom   Equipment Recommendations  None recommended by  PT    Recommendations for Other Services       Precautions / Restrictions Precautions Precautions: Fall Precaution Comments: isolation (airborne for Covid+) Restrictions Weight Bearing Restrictions: No     Mobility  Bed Mobility               General bed mobility comments: NT, pt in recliner    Transfers Overall transfer level: Needs assistance Equipment used: Sliding board, Rolling walker (2 wheels) Transfers: Bed to chair/wheelchair/BSC, Sit to/from Stand Sit to Stand: Total assist, +2 physical assistance           General transfer comment: +2 total assist with sliding board transfer to pt's left from chair to bed slightly uphill, +2 mod A to pt's right from bed to chair slightly downhill, max multi-modal cues for sequencing    Ambulation/Gait               General Gait Details: unable   Stairs             Wheelchair Mobility    Modified Rankin (Stroke Patients Only)       Balance Overall balance assessment: Needs assistance Sitting-balance support: Feet supported Sitting balance-Leahy Scale: Good     Standing balance support: Bilateral upper extremity supported, During functional activity Standing balance-Leahy Scale: Zero                              Cognition Arousal/Alertness: Awake/alert Behavior During Therapy: WFL for tasks assessed/performed Overall Cognitive Status: Within Functional Limits for tasks assessed  Exercises      General Comments        Pertinent Vitals/Pain Pain Assessment Pain Assessment: No/denies pain    Home Living                          Prior Function            PT Goals (current goals can now be found in the care plan section) Progress towards PT goals: Progressing toward goals    Frequency    Min 2X/week      PT Plan Current plan remains appropriate    Co-evaluation              AM-PAC  PT "6 Clicks" Mobility   Outcome Measure  Help needed turning from your back to your side while in a flat bed without using bedrails?: A Little Help needed moving from lying on your back to sitting on the side of a flat bed without using bedrails?: A Little Help needed moving to and from a bed to a chair (including a wheelchair)?: Total Help needed standing up from a chair using your arms (e.g., wheelchair or bedside chair)?: Total Help needed to walk in hospital room?: Total Help needed climbing 3-5 steps with a railing? : Total 6 Click Score: 10    End of Session Equipment Utilized During Treatment: Gait belt Activity Tolerance: Patient tolerated treatment well Patient left: in chair;with nursing/sitter in room (Pt left in chair with CNA's for hygiene) Nurse Communication: Mobility status PT Visit Diagnosis: Other abnormalities of gait and mobility (R26.89);Muscle weakness (generalized) (M62.81)     Time: [id number] PT Time Calculation (min) (ACUTE ONLY): 25 min  Charges:  $Therapeutic Activity: 23-37 mins                     D. Scott Billy Turvey PT, DPT 09/27/22, 3:17 PM

## 2022-09-27 NOTE — Plan of Care (Signed)
  Problem: Education: Goal: Ability to demonstrate management of disease process will improve Outcome: Progressing Goal: Ability to verbalize understanding of medication therapies will improve Outcome: Progressing Goal: Individualized Educational Video(s) Outcome: Progressing   Problem: Activity: Goal: Capacity to carry out activities will improve Outcome: Progressing   Problem: Cardiac: Goal: Ability to achieve and maintain adequate cardiopulmonary perfusion will improve Outcome: Progressing   Problem: Education: Goal: Knowledge of risk factors and measures for prevention of condition will improve Outcome: Progressing   Problem: Coping: Goal: Psychosocial and spiritual needs will be supported Outcome: Progressing   Problem: Respiratory: Goal: Will maintain a patent airway Outcome: Progressing Goal: Complications related to the disease process, condition or treatment will be avoided or minimized Outcome: Progressing   Problem: Education: Goal: Knowledge of General Education information will improve Description: Including pain rating scale, medication(s)/side effects and non-pharmacologic comfort measures Outcome: Progressing   Problem: Health Behavior/Discharge Planning: Goal: Ability to manage health-related needs will improve Outcome: Progressing   Problem: Clinical Measurements: Goal: Ability to maintain clinical measurements within normal limits will improve Outcome: Progressing Goal: Will remain free from infection Outcome: Progressing Goal: Diagnostic test results will improve Outcome: Progressing Goal: Respiratory complications will improve Outcome: Progressing Goal: Cardiovascular complication will be avoided Outcome: Progressing   Problem: Activity: Goal: Risk for activity intolerance will decrease Outcome: Progressing   Problem: Nutrition: Goal: Adequate nutrition will be maintained Outcome: Progressing   Problem: Coping: Goal: Level of anxiety  will decrease Outcome: Progressing   Problem: Elimination: Goal: Will not experience complications related to bowel motility Outcome: Progressing Goal: Will not experience complications related to urinary retention Outcome: Progressing   Problem: Pain Managment: Goal: General experience of comfort will improve Outcome: Progressing   Problem: Safety: Goal: Ability to remain free from injury will improve Outcome: Progressing   Problem: Skin Integrity: Goal: Risk for impaired skin integrity will decrease Outcome: Progressing   Problem: Education: Goal: Knowledge of disease or condition will improve Outcome: Progressing Goal: Knowledge of secondary prevention will improve (MUST DOCUMENT ALL) Outcome: Progressing Goal: Knowledge of patient specific risk factors will improve [place]ta [place] N/A or DELETE if not current risk factor) Outcome: Progressing   Problem: Ischemic Stroke/TIA Tissue Perfusion: Goal: Complications of ischemic stroke/TIA will be minimized Outcome: Progressing   Problem: Coping: Goal: Will verbalize positive feelings about self Outcome: Progressing Goal: Will identify appropriate support needs Outcome: Progressing   Problem: Health Behavior/Discharge Planning: Goal: Ability to manage health-related needs will improve Outcome: Progressing Goal: Goals will be collaboratively established with patient/family Outcome: Progressing   Problem: Self-Care: Goal: Ability to participate in self-care as condition permits will improve Outcome: Progressing Goal: Verbalization of feelings and concerns over difficulty with self-care will improve Outcome: Progressing Goal: Ability to communicate needs accurately will improve Outcome: Progressing   Problem: Nutrition: Goal: Risk of aspiration will decrease Outcome: Progressing Goal: Dietary intake will improve Outcome: Progressing

## 2022-09-27 NOTE — Progress Notes (Signed)
[facility]  ROUNDING NOTE   Subjective:   Gabriela [name] is a 89 y.o. female with past medical history including hypertension, CAD, diabetes, MGUS, and chronic kidney disease stage 3a. Patient presents to ED with weakness, slurred speech and facial droop. She was admitted for Slurred speech [R47.81] Pleural effusion [J90] Acute CHF (congestive heart failure) ([place]) [I50.9] AKI (acute kidney injury) ([place]) [N17.9] COVID-19 [U07.1]  Patient is known to our practice and is followed by Dr Candiss Norse. She was last seen in the office on September 15, 2022. She was found to have lower extremity edema up to thighs and transitioned from low dose furosemide to low dose torsemide. Patient is seen sitting up in chair, no family at bedside. Currently on room air. States she feels better since admission. Denies shortness of breath. Tolerating small meals without nausea or vomiting.   Creatinine initially elevated on admission, 1.57. Baseline appears to be 1.48 with GFR 33 on 09/15/22. Renal function has improved since that time. Sodium elevated to 147 today. Patient found to be Covid positive.  At family request, we have been consulted to monitor renal function.    Objective:  Vital signs in last 24 hours:  Temp:  [97.3 F (36.3 C)-98.6 F (37 C)] 97.3 F (36.3 C) (02/13 1216) Pulse Rate:  [64-107] 73 (02/13 1216) Resp:  [14-22] 14 (02/13 1216) BP: (99-164)/(64-105) 151/76 (02/13 1216) SpO2:  [91 %-100 %] 100 % (02/13 1216)  Weight change: 2.377 kg Filed Weights   09/24/22 0758 09/25/22 0837 09/26/22 0903  Weight: 59.9 kg 58.4 kg 60.8 kg    Intake/Output: I/O last 3 completed shifts: In: 56 [P.O.:720] Out: 750 [Urine:750]   Intake/Output this shift:  Total I/O In: 240 [P.O.:240] Out: -   Physical Exam: General: NAD, sitting in chair  Head: Normocephalic, atraumatic. Moist oral mucosal membranes  Eyes: Anicteric  Lungs:  Left sided crackles, normal effort.  Heart: Regular  rate and rhythm  Abdomen:  Soft, nontender  Extremities:  Trace-1+ peripheral edema.  Neurologic: Alert and oriented, moving all four extremities  Skin: No lesions  Access: None    Basic Metabolic Panel: Recent Labs  Lab 09/22/22 0255 09/23/22 1211 09/24/22 0529 09/25/22 0640 09/26/22 0421 09/27/22 0546  NA 143  --  139 145 143 147*  K 4.1  --  4.0 3.5 4.0 4.7  CL 113*  --  106 115* 113* 112*  CO2 20*  --  25 25 21* 26  GLUCOSE 118* 132* 156* 98 199* 127*  BUN 31*  --  35* 31* 33* 34*  CREATININE 1.51*  --  1.45* 1.17* 1.19* 1.14*  CALCIUM 7.7*  --  8.1* 7.9* 7.5* 8.1*  MG  --   --   --   --   --  1.9  PHOS  --   --   --   --   --  2.8    Liver Function Tests: Recent Labs  Lab 09/21/22 2033  AST 22  ALT 15  ALKPHOS 131*  BILITOT 1.5*  PROT 6.8  ALBUMIN 2.8*   No results for input(s): "LIPASE", "AMYLASE" in the last 168 hours. No results for input(s): "AMMONIA" in the last 168 hours.  CBC: Recent Labs  Lab 09/21/22 2033 09/22/22 0255 09/24/22 0529 09/26/22 0632 09/27/22 0546  WBC 11.5* 9.9 7.6 11.4* 14.3*  NEUTROABS 7.3  --   --   --   --   HGB 8.8* 8.9* 10.2* 9.2* 8.4*  HCT 29.5* 29.3*  33.4* 29.8* 27.4*  MCV 83.3 82.3 81.9 79.5* 79.7*  PLT 51* 44* 41* 37* 39*    Cardiac Enzymes: No results for input(s): "CKTOTAL", "CKMB", "CKMBINDEX", "TROPONINI" in the last 168 hours.  BNP: Invalid input(s): "POCBNP"  CBG: Recent Labs  Lab 09/23/22 0859 09/23/22 1018 09/23/22 1656 09/23/22 2127 09/24/22 2125  GLUCAP 85 30* 140* 231* 183*    Microbiology: Results for orders placed or performed during the hospital encounter of 09/21/22  Resp panel by RT-PCR (RSV, Flu A&B, Covid) Anterior Nasal Swab     Status: Abnormal   Collection Time: 09/21/22  8:33 PM   Specimen: Anterior Nasal Swab  Result Value Ref Range Status   SARS Coronavirus 2 by RT PCR POSITIVE (A) NEGATIVE Final    Comment: (NOTE) SARS-CoV-2 target nucleic acids are DETECTED.  The  SARS-CoV-2 RNA is generally detectable in upper respiratory specimens during the acute phase of infection. Positive results are indicative of the presence of the identified virus, but do not rule out bacterial infection or co-infection with other pathogens not detected by the test. Clinical correlation with patient history and other diagnostic information is necessary to determine patient infection status. The expected result is Negative.  Fact Sheet for Patients: EntrepreneurPulse.com.au  Fact Sheet for Healthcare Providers: IncredibleEmployment.be  This test is not yet approved or cleared by the [place] FDA and  has been authorized for detection and/or diagnosis of SARS-CoV-2 by FDA under an Emergency Use Authorization (EUA).  This EUA will remain in effect (meaning this test can be used) for the duration of  the COVID-19 declaration under Section 564(b)(1) of the A ct, 21 U.S.C. section 360bbb-3(b)(1), unless the authorization is terminated or revoked sooner.     Influenza A by PCR NEGATIVE NEGATIVE Final   Influenza B by PCR NEGATIVE NEGATIVE Final    Comment: (NOTE) The Xpert Xpress SARS-CoV-2/FLU/RSV plus assay is intended as an aid in the diagnosis of influenza from Nasopharyngeal swab specimens and should not be used as a sole basis for treatment. Nasal washings and aspirates are unacceptable for Xpert Xpress SARS-CoV-2/FLU/RSV testing.  Fact Sheet for Patients: EntrepreneurPulse.com.au  Fact Sheet for Healthcare Providers: IncredibleEmployment.be  This test is not yet approved or cleared by the [place] FDA and has been authorized for detection and/or diagnosis of SARS-CoV-2 by FDA under an Emergency Use Authorization (EUA). This EUA will remain in effect (meaning this test can be used) for the duration of the COVID-19 declaration under Section 564(b)(1) of the Act, 21 U.S.C. section  360bbb-3(b)(1), unless the authorization is terminated or revoked.     Resp Syncytial Virus by PCR NEGATIVE NEGATIVE Final    Comment: (NOTE) Fact Sheet for Patients: EntrepreneurPulse.com.au  Fact Sheet for Healthcare Providers: IncredibleEmployment.be  This test is not yet approved or cleared by the [place] FDA and has been authorized for detection and/or diagnosis of SARS-CoV-2 by FDA under an Emergency Use Authorization (EUA). This EUA will remain in effect (meaning this test can be used) for the duration of the COVID-19 declaration under Section 564(b)(1) of the Act, 21 U.S.C. section 360bbb-3(b)(1), unless the authorization is terminated or revoked.  Performed at [facility], [address] [place], [place] [address]     Coagulation Studies: No results for input(s): "LABPROT", "INR" in the last 72 hours.  Urinalysis: No results for input(s): "COLORURINE", "LABSPEC", "PHURINE", "GLUCOSEU", "HGBUR", "BILIRUBINUR", "KETONESUR", "PROTEINUR", "UROBILINOGEN", "NITRITE", "LEUKOCYTESUR" in the last 72 hours.  Invalid input(s): "APPERANCEUR"    Imaging: No  results found.   Medications:     brinzolamide  1 drop Both Eyes BID   And   brimonidine  1 drop Both Eyes BID   budesonide  2 puff Inhalation BID   clopidogrel  75 mg Oral Daily   feeding supplement (GLUCERNA SHAKE)  237 mL Oral TID BM   ferrous sulfate  325 mg Oral Q breakfast   furosemide  20 mg Oral Daily   metoprolol succinate  12.5 mg Oral Daily   potassium chloride  20 mEq Oral Daily   predniSONE  40 mg Oral Daily   sodium chloride flush  10-40 mL Intracatheter Q12H   timolol  1 drop Both Eyes BID   acetaminophen **OR** acetaminophen, albuterol, chlorpheniramine-HYDROcodone, guaiFENesin-dextromethorphan, ipratropium, methocarbamol, ondansetron **OR** ondansetron (ZOFRAN) IV, sodium chloride flush, traMADol  Assessment/ Plan:  Gabriela [name] is a  89 y.o.  female with past medical history including hypertension, CAD, diabetes, MGUS, and chronic kidney disease stage 3a. Patient presents to ED with weakness, slurred speech and facial droop. She was admitted for Slurred speech [R47.81] Pleural effusion [J90] Acute CHF (congestive heart failure) (H[facility]) [I50.9] AKI (acute kidney injury) ([place]) [N17.9] COVID-19 [U07.1]   Chronic kidney disease stage IIIa with baseline creatinine 1.48 and GFR of 33 on 09/15/22.  Renal function improved beyond baseline. Continue current treatments. No acute need for dialysis.   Lab Results  Component Value Date   CREATININE 1.14 (H) 09/27/2022   CREATININE 1.19 (H) 09/26/2022   CREATININE 1.17 (H) 09/25/2022    Intake/Output Summary (Last 24 hours) at 09/27/2022 1503 Last data filed at 09/27/2022 0900 Gross per 24 hour  Intake 480 ml  Output 350 ml  Net 130 ml   2. Anemia of chronic kidney disease Lab Results  Component Value Date   HGB 8.4 (L) 09/27/2022    Hgb below desired target. Will monitor daily.   3. Hypertension with chronic kidney disease. Home regimen includes torsemide and metoprolol. Prescribed furosemide and metoprolol.   4. Diabetes mellitus type II with chronic kidney disease/renal manifestations: noninsulin dependent. Most recent hemoglobin A1c is 6.8 on 07/01/22.   5. Acute ischemic stroke, right cerebellar. Neurology following. Plavix ordered.     LOS: [place] 2/13/20243:03 PM

## 2022-09-28 DIAGNOSIS — J9601 Acute respiratory failure with hypoxia: Secondary | ICD-10-CM | POA: Diagnosis not present

## 2022-09-28 LAB — BASIC METABOLIC PANEL
Anion gap: 7 (ref 5–15)
BUN: 37 mg/dL — ABNORMAL HIGH (ref 8–23)
CO2: 25 mmol/L (ref 22–32)
Calcium: 8 mg/dL — ABNORMAL LOW (ref 8.9–10.3)
Chloride: 113 mmol/L — ABNORMAL HIGH (ref 98–111)
Creatinine, Ser: 1.11 mg/dL — ABNORMAL HIGH (ref 0.44–1.00)
GFR, Estimated: 46 mL/min — ABNORMAL LOW (ref >60)
Glucose, Bld: 160 mg/dL — ABNORMAL HIGH (ref 70–99)
Potassium: 4.4 mmol/L (ref 3.5–5.1)
Sodium: 145 mmol/L (ref 135–145)

## 2022-09-28 LAB — CBC
HCT: 29.5 % — ABNORMAL LOW (ref 36.0–46.0)
Hemoglobin: 9 g/dL — ABNORMAL LOW (ref 12.0–15.0)
MCH: 24.4 pg — ABNORMAL LOW (ref 26.0–34.0)
MCHC: 30.5 g/dL (ref 30.0–36.0)
MCV: 79.9 fL — ABNORMAL LOW (ref 80.0–100.0)
Platelets: 39 10*3/uL — ABNORMAL LOW (ref 150–400)
RBC: 3.69 MIL/uL — ABNORMAL LOW (ref 3.87–5.11)
RDW: 20 % — ABNORMAL HIGH (ref 11.5–15.5)
WBC: 10 10*3/uL (ref 4.0–10.5)
nRBC: 0.2 % (ref 0.0–0.2)

## 2022-09-28 LAB — MAGNESIUM: Magnesium: 2 mg/dL (ref 1.7–2.4)

## 2022-09-28 LAB — PHOSPHORUS: Phosphorus: 3.1 mg/dL (ref 2.5–4.6)

## 2022-09-28 NOTE — Progress Notes (Signed)
PROGRESS NOTE    Gabriela [name]  [id number] DOB: 12-Feb-1926 DOA: 09/21/2022  PCP: [name], MD   Brief Narrative:  This 89 y.o. female with PMH significant for HTN, CAD, CKD 3a, DM, MGUS who presents to the ED by EMS with  3-day history of weakness, confusion, slurred speech and dropping things.  Most of the history is given by the granddaughter at the bedside who stated that she noticed that her grandmother has increasingly worsening swelling of the bilateral lower extremities up to the thighs in spite of taking her lasix 10 mg daily.  She has no prior history of CHF and states she was prescribed Lasix by her PCP.  She took her to the nephrologist on 09/15/22 who changed  Lasix to torsemide and ordered compression socks.  She stated that since the medication was changed, her grandmother continue to have swelling and seem to be weaker and she also has developed a cough that has become increasingly more congested.  She is not short of breath,  at baseline she does not move around a lot.  ED course and data review: Afebrile with unremarkable vitals. Labs significant for troponin of 60 and BNP 4115.  WBC 11,500 with hemoglobin 8.8, platelets 51,000. Creatinine 1.57 above baseline of 0.95 a month ago, respiratory viral panel positive for COVID.   Chest x-ray showing bilateral pleural effusions with basilar atelectasis. MRI ordered to evaluate for stroke.  Hospitalist consulted for admission. MRI showed a 7 mm acute ischemic nonhemorrhagic right cerebral infarct.   Assessment & Plan:   Principal Problem:   Acute hypoxic respiratory failure (HCC) Active Problems:   Acute stroke due to ischemia (HCC)   Chronic systolic CHF (congestive heart failure) (HCC)   COVID-19   Acute renal failure superimposed on stage 3a chronic kidney disease (HCC)   Bilateral pleural effusion   Acute metabolic encephalopathy   Generalized weakness   Essential (primary) hypertension   Arthritis,  degenerative   MGUS (monoclonal gammopathy of unknown significance)   Type 2 diabetes mellitus with circulatory disorder (HCC)   Myocardial injury  Acute hypoxic respiratory failure: Improved.  SpO2  100% on 4 L. Continue Oxygen supplementation.   Likely secondary to COVID-19.  Continue prednisone and Lasix. Continue nebulized bronchodilators.   Acute stroke due to ischemia: Right cerebellar area.  I do not think she needs dual antiplatelet agent at this time given low platelet count. Currently on Plavix. As per the Neurology benefits of statins are outweighed by the risks of liver damage and myopathy.   Chronic systolic CHF: Last LVEF [id number].  Continue Lasix 20 mg daily. Appears euvolemic.   COVID-19: Continue supportive care. Continue airborne precautions.   Bilateral pleural effusion: Continue to monitor, shortness of breath has improved.   AKI on CKD stage IIIa: Creatinine 1.57 on arrival, above baseline of 0.95 a month prior.   Creatinine 1.14 today.  Resumed Lasix and watch creatinine.   Acute metabolic encephalopathy; Resolved.  Generalized weakness: At baseline, patient is currently mostly sedentary following a bilateral femoral fracture in November 2023 while working with PT. PT recommended SNF. Patient lives alone and has 24-hour family support all the time. As per patient's granddaughter, they will take her home if she does better with board transfers.  Otherwise they will consider rehab.   Elevated troponin: Troponin slightly elevated but we can get this with a stroke also.   Type 2 diabetes mellitus: With her hands and feet being cold I do not think the  fingersticks are accurate at this time.   Hb A1c only 6.8.  Will continue to watch sugars with daily chemistry.   MGUS (monoclonal gammopathy of unknown significance) Anemia and thrombocytopenia: Last hemoglobin 10.2 and last platelet count 41. Continue  to monitor.   Osteoarthritis: Continue Tylenol and  Robaxin with tramadol as needed.   Essential hypertension: Continue metoprolol.   DVT prophylaxis:  SCDs Code Status: DNR Family Communication: Spoke with daughter on phone Disposition Plan:    Status is: Inpatient Remains inpatient appropriate because: Admitted for acute hypoxic respiratory failure in the setting of COVID-19 infection.  PT and OT recommended SNF but family wants to take her home with home health services.  Patient has 24-hour care.  Patient needs to transfer from bed to chair before family can take her home.    Consultants:  None  Procedures:None  Antimicrobials:  Anti-infectives (From admission, onward)    Start     Dose/Rate Route Frequency Ordered Stop   09/23/22 1000  remdesivir 100 mg in sodium chloride 0.9 % 100 mL IVPB  Status:  Discontinued       See Hyperspace for full Linked Orders Report.   100 mg 200 mL/hr over 30 Minutes Intravenous Daily 09/22/22 0000 09/22/22 0840   09/22/22 0430  remdesivir 100 mg in sodium chloride 0.9 % 100 mL IVPB       See Hyperspace for full Linked Orders Report.   100 mg 200 mL/hr over 30 Minutes Intravenous  Once 09/22/22 0000 09/23/22 0700   09/22/22 0400  remdesivir 100 mg in sodium chloride 0.9 % 100 mL IVPB       See Hyperspace for full Linked Orders Report.   100 mg 200 mL/hr over 30 Minutes Intravenous  Once 09/22/22 0000 09/22/22 0456      Subjective: Patient was seen and examined at bedside.  Overnight events noted.   Patient reports feeling weak but appears better and wants to go home. She wants to participate with physical therapy.  Objective: Vitals:   09/27/22 2016 09/28/22 0055 09/28/22 0445 09/28/22 0842  BP: (!) 163/77 (!) 153/88 (!) 142/70 (!) 163/90  Pulse: 80 73 71 86  Resp: 20 16 20 16  $ Temp: 97.6 F (36.4 C) (!) 97.5 F (36.4 C) 97.9 F (36.6 C) 97.7 F (36.5 C)  TempSrc: Oral Oral Oral   SpO2: 100% 100% 100% 100%  Weight:        Intake/Output Summary (Last 24 hours) at 09/28/2022  1202 Last data filed at 09/28/2022 0900 Gross per 24 hour  Intake 600 ml  Output 800 ml  Net -200 ml   Filed Weights   09/24/22 0758 09/25/22 0837 09/26/22 0903  Weight: 59.9 kg 58.4 kg 60.8 kg    Examination:  General exam: Appears comfortable, not in any acute distress.  Deconditioned. Respiratory system: CTA bilaterally, Respiratory effort normal.  RR 16. Cardiovascular system: S1 & S2 heard, regular rate and rhythm, no murmur. Gastrointestinal system: Abdomen is soft, non tender, nondistended, BS+ Central nervous system: Alert and oriented x 2, no focal neurological deficits. Extremities: No edema, no cyanosis, no clubbing Skin: No rashes, lesions or ulcers Psychiatry:  Mood & affect appropriate.     Data Reviewed: I have personally reviewed following labs and imaging studies  CBC: Recent Labs  Lab 09/21/22 2033 09/22/22 0255 09/24/22 0529 09/26/22 0632 09/27/22 0546 09/28/22 0731  WBC 11.5* 9.9 7.6 11.4* 14.3* 10.0  NEUTROABS 7.3  --   --   --   --   --  HGB 8.8* 8.9* 10.2* 9.2* 8.4* 9.0*  HCT 29.5* 29.3* 33.4* 29.8* 27.4* 29.5*  MCV 83.3 82.3 81.9 79.5* 79.7* 79.9*  PLT 51* 44* 41* 37* 39* 39*   Basic Metabolic Panel: Recent Labs  Lab 09/24/22 0529 09/25/22 0640 09/26/22 0421 09/27/22 0546 09/28/22 0731  NA 139 145 143 147* 145  K 4.0 3.5 4.0 4.7 4.4  CL 106 115* 113* 112* 113*  CO2 25 25 21* 26 25  GLUCOSE 156* 98 199* 127* 160*  BUN 35* 31* 33* 34* 37*  CREATININE 1.45* 1.17* 1.19* 1.14* 1.11*  CALCIUM 8.1* 7.9* 7.5* 8.1* 8.0*  MG  --   --   --  1.9 2.0  PHOS  --   --   --  2.8 3.1   GFR: Estimated Creatinine Clearance: 26.8 mL/min (A) (by C-G formula based on SCr of 1.11 mg/dL (H)). Liver Function Tests: Recent Labs  Lab 09/21/22 2033  AST 22  ALT 15  ALKPHOS 131*  BILITOT 1.5*  PROT 6.8  ALBUMIN 2.8*   No results for input(s): "LIPASE", "AMYLASE" in the last 168 hours. No results for input(s): "AMMONIA" in the last 168  hours. Coagulation Profile: No results for input(s): "INR", "PROTIME" in the last 168 hours. Cardiac Enzymes: No results for input(s): "CKTOTAL", "CKMB", "CKMBINDEX", "TROPONINI" in the last 168 hours. BNP (last 3 results) No results for input(s): "PROBNP" in the last 8760 hours. HbA1C: No results for input(s): "HGBA1C" in the last 72 hours. CBG: Recent Labs  Lab 09/23/22 0859 09/23/22 1018 09/23/22 1656 09/23/22 2127 09/24/22 2125  GLUCAP 85 30* 140* 231* 183*   Lipid Profile: No results for input(s): "CHOL", "HDL", "LDLCALC", "TRIG", "CHOLHDL", "LDLDIRECT" in the last 72 hours.  Thyroid Function Tests: No results for input(s): "TSH", "T4TOTAL", "FREET4", "T3FREE", "THYROIDAB" in the last 72 hours. Anemia Panel: No results for input(s): "VITAMINB12", "FOLATE", "FERRITIN", "TIBC", "IRON", "RETICCTPCT" in the last 72 hours. Sepsis Labs: No results for input(s): "PROCALCITON", "LATICACIDVEN" in the last 168 hours.  Recent Results (from the past 240 hour(s))  Resp panel by RT-PCR (RSV, Flu A&B, Covid) Anterior Nasal Swab     Status: Abnormal   Collection Time: 09/21/22  8:33 PM   Specimen: Anterior Nasal Swab  Result Value Ref Range Status   SARS Coronavirus 2 by RT PCR POSITIVE (A) NEGATIVE Final    Comment: (NOTE) SARS-CoV-2 target nucleic acids are DETECTED.  The SARS-CoV-2 RNA is generally detectable in upper respiratory specimens during the acute phase of infection. Positive results are indicative of the presence of the identified virus, but do not rule out bacterial infection or co-infection with other pathogens not detected by the test. Clinical correlation with patient history and other diagnostic information is necessary to determine patient infection status. The expected result is Negative.  Fact Sheet for Patients: EntrepreneurPulse.com.au  Fact Sheet for Healthcare Providers: IncredibleEmployment.be  This test is not yet  approved or cleared by the [place] FDA and  has been authorized for detection and/or diagnosis of SARS-CoV-2 by FDA under an Emergency Use Authorization (EUA).  This EUA will remain in effect (meaning this test can be used) for the duration of  the COVID-19 declaration under Section 564(b)(1) of the A ct, 21 U.S.C. section 360bbb-3(b)(1), unless the authorization is terminated or revoked sooner.     Influenza A by PCR NEGATIVE NEGATIVE Final   Influenza B by PCR NEGATIVE NEGATIVE Final    Comment: (NOTE) The Xpert Xpress SARS-CoV-2/FLU/RSV plus assay is intended as  an aid in the diagnosis of influenza from Nasopharyngeal swab specimens and should not be used as a sole basis for treatment. Nasal washings and aspirates are unacceptable for Xpert Xpress SARS-CoV-2/FLU/RSV testing.  Fact Sheet for Patients: EntrepreneurPulse.com.au  Fact Sheet for Healthcare Providers: IncredibleEmployment.be  This test is not yet approved or cleared by the [place] FDA and has been authorized for detection and/or diagnosis of SARS-CoV-2 by FDA under an Emergency Use Authorization (EUA). This EUA will remain in effect (meaning this test can be used) for the duration of the COVID-19 declaration under Section 564(b)(1) of the Act, 21 U.S.C. section 360bbb-3(b)(1), unless the authorization is terminated or revoked.     Resp Syncytial Virus by PCR NEGATIVE NEGATIVE Final    Comment: (NOTE) Fact Sheet for Patients: EntrepreneurPulse.com.au  Fact Sheet for Healthcare Providers: IncredibleEmployment.be  This test is not yet approved or cleared by the [place] FDA and has been authorized for detection and/or diagnosis of SARS-CoV-2 by FDA under an Emergency Use Authorization (EUA). This EUA will remain in effect (meaning this test can be used) for the duration of the COVID-19 declaration under Section 564(b)(1) of  the Act, 21 U.S.C. section 360bbb-3(b)(1), unless the authorization is terminated or revoked.  Performed at [facility], [address] [place], [place] [address]     Radiology Studies: No results found.  Scheduled Meds:  brinzolamide  1 drop Both Eyes BID   And   brimonidine  1 drop Both Eyes BID   budesonide  2 puff Inhalation BID   clopidogrel  75 mg Oral Daily   feeding supplement (GLUCERNA SHAKE)  237 mL Oral TID BM   ferrous sulfate  325 mg Oral Q breakfast   furosemide  20 mg Oral Daily   metoprolol succinate  12.5 mg Oral Daily   potassium chloride  20 mEq Oral Daily   predniSONE  40 mg Oral Daily   sodium chloride flush  10-40 mL Intracatheter Q12H   timolol  1 drop Both Eyes BID   Continuous Infusions:   LOS: 7 days    Time spent: 35 min    Affie Gasner, MD [facility]   If 7PM-7AM, please contact night-coverage

## 2022-09-28 NOTE — Plan of Care (Signed)
  Problem: Education: Goal: Ability to demonstrate management of disease process will improve Outcome: Progressing Goal: Ability to verbalize understanding of medication therapies will improve Outcome: Progressing Goal: Individualized Educational Video(s) Outcome: Progressing   Problem: Activity: Goal: Capacity to carry out activities will improve Outcome: Progressing   Problem: Cardiac: Goal: Ability to achieve and maintain adequate cardiopulmonary perfusion will improve Outcome: Progressing   Problem: Education: Goal: Knowledge of risk factors and measures for prevention of condition will improve Outcome: Progressing   Problem: Coping: Goal: Psychosocial and spiritual needs will be supported Outcome: Progressing   Problem: Respiratory: Goal: Will maintain a patent airway Outcome: Progressing Goal: Complications related to the disease process, condition or treatment will be avoided or minimized Outcome: Progressing   Problem: Education: Goal: Knowledge of General Education information will improve Description: Including pain rating scale, medication(s)/side effects and non-pharmacologic comfort measures Outcome: Progressing   Problem: Health Behavior/Discharge Planning: Goal: Ability to manage health-related needs will improve Outcome: Progressing   Problem: Clinical Measurements: Goal: Ability to maintain clinical measurements within normal limits will improve Outcome: Progressing Goal: Will remain free from infection Outcome: Progressing Goal: Diagnostic test results will improve Outcome: Progressing Goal: Respiratory complications will improve Outcome: Progressing Goal: Cardiovascular complication will be avoided Outcome: Progressing   Problem: Activity: Goal: Risk for activity intolerance will decrease Outcome: Progressing   Problem: Nutrition: Goal: Adequate nutrition will be maintained Outcome: Progressing   Problem: Coping: Goal: Level of anxiety  will decrease Outcome: Progressing   Problem: Elimination: Goal: Will not experience complications related to bowel motility Outcome: Progressing Goal: Will not experience complications related to urinary retention Outcome: Progressing   Problem: Pain Managment: Goal: General experience of comfort will improve Outcome: Progressing   Problem: Safety: Goal: Ability to remain free from injury will improve Outcome: Progressing   Problem: Skin Integrity: Goal: Risk for impaired skin integrity will decrease Outcome: Progressing   Problem: Education: Goal: Knowledge of disease or condition will improve Outcome: Progressing Goal: Knowledge of secondary prevention will improve (MUST DOCUMENT ALL) Outcome: Progressing Goal: Knowledge of patient specific risk factors will improve [place]ta [place] N/A or DELETE if not current risk factor) Outcome: Progressing   Problem: Ischemic Stroke/TIA Tissue Perfusion: Goal: Complications of ischemic stroke/TIA will be minimized Outcome: Progressing   Problem: Coping: Goal: Will verbalize positive feelings about self Outcome: Progressing Goal: Will identify appropriate support needs Outcome: Progressing   Problem: Health Behavior/Discharge Planning: Goal: Ability to manage health-related needs will improve Outcome: Progressing Goal: Goals will be collaboratively established with patient/family Outcome: Progressing   Problem: Self-Care: Goal: Ability to participate in self-care as condition permits will improve Outcome: Progressing Goal: Verbalization of feelings and concerns over difficulty with self-care will improve Outcome: Progressing Goal: Ability to communicate needs accurately will improve Outcome: Progressing   Problem: Nutrition: Goal: Risk of aspiration will decrease Outcome: Progressing Goal: Dietary intake will improve Outcome: Progressing

## 2022-09-28 NOTE — Progress Notes (Signed)
Occupational Therapy Treatment Patient Details Name: Gabriela [name] MRN: [id number] DOB: 05-Aug-1926 Today's Date: 09/28/2022   History of present illness Pt is a 89 y.o. female with medical history significant for HTN, CAD, CKD 3a, DM, MGUS who presents to the ED by EMS with concerns for 3-day history of weakness, confusion, slurred speech and dropping things. MD assessment includes: Acute hypoxic respiratory failure with COVID-19, acute stroke due to ischemia, AKI, and generalized weakness.   OT comments  Pt received seated in recliner, feet on the floor, sleeping, tray table with pillow on top in front of her and pt leaning forward onto pillow. Easily awoken to the sound of her name; willing to work with OT on grooming. Did not t/f during this session as pt is a heavy x2 assist to t/f and OT in room alone. See flowsheet below for further details of session. Left seated in recliner, BIL LE elevated (supported for heels floating) with all needs in reach.   Recommendations for follow up therapy are one component of a multi-disciplinary discharge planning process, led by the attending physician.  Recommendations may be updated based on patient status, additional functional criteria and insurance authorization.    Follow Up Recommendations  Skilled nursing-short term rehab (<3 hours/day)     Assistance Recommended at Discharge Frequent or constant Supervision/Assistance  Patient can return home with the following  Two people to help with walking and/or transfers;A lot of help with bathing/dressing/bathroom;Assistance with cooking/housework;Direct supervision/assist for medications management;Direct supervision/assist for financial management;Assist for transportation;Help with stairs or ramp for entrance   Equipment Recommendations  Other (comment) (defer to next venue of care)    Recommendations for Other Services      Precautions / Restrictions Precautions Precautions: Fall Precaution  Comments: isolation (airborne for Covid+) Restrictions Weight Bearing Restrictions: No       Mobility Bed Mobility               General bed mobility comments: not tested; pt in recliner. Per PT note yesterday, pt heavy +2 slideboard assist to recliner.    Transfers    Not attempted this session 2/2 not having +2 assist.                     Balance  Good sitting balance                                         ADL either performed or assessed with clinical judgement   ADL Overall ADL's : Needs assistance/impaired     Grooming: Set up;Oral care Grooming Details (indicate cue type and reason): seated in recliner                                    Extremity/Trunk Assessment Upper Extremity Assessment Upper Extremity Assessment: Generalized weakness;Overall [facility] for tasks assessed   Lower Extremity Assessment Lower Extremity Assessment: Generalized weakness        Vision       Perception     Praxis      Cognition Arousal/Alertness: Awake/alert ((one OT woke up pt)) Behavior During Therapy: WFL for tasks assessed/performed Overall Cognitive Status: Within Functional Limits for tasks assessed  General Comments: Able to make needs known        Exercises      Shoulder Instructions       General Comments Pt received sleeping in recliner with feet on floor and pt leaning forward on pillow on tray table in front of her; easily awoken; immediately asking for water. ice water obtained for pt.    Pertinent Vitals/ Pain       Pain Assessment Pain Assessment: 0-10 Pain Score:  (unrated; pain with RLE movement) Pain Location: RLE with elevation; once reclined slightly pt felt better. Pain Descriptors / Indicators: Aching  Home Living                                          Prior Functioning/Environment              Frequency  Min 2X/week         Progress Toward Goals  OT Goals(current goals can now be found in the care plan section)  Progress towards OT goals: Progressing toward goals  Acute Rehab OT Goals Patient Stated Goal: Get better OT Goal Formulation: With patient Time For Goal Achievement: 10/07/22 Potential to Achieve Goals: Fair ADL Goals Pt Will Perform Grooming: with modified independence;sitting Pt Will Perform Upper Body Bathing: with min assist;bed level  Plan Discharge plan remains appropriate    Co-evaluation                 AM-PAC OT "6 Clicks" Daily Activity     Outcome Measure   Help from another person eating meals?: None Help from another person taking care of personal grooming?: None Help from another person toileting, which includes using toliet, bedpan, or urinal?: Total Help from another person bathing (including washing, rinsing, drying)?: Total Help from another person to put on and taking off regular upper body clothing?: A Little Help from another person to put on and taking off regular lower body clothing?: Total 6 Click Score: 14    End of Session Equipment Utilized During Treatment: Oxygen (pt on 3L O2 throughout session)  OT Visit Diagnosis: Unsteadiness on feet (R26.81);Other abnormalities of gait and mobility (R26.89);Muscle weakness (generalized) (M62.81)   Activity Tolerance Patient limited by fatigue   Patient Left in chair;with call bell/phone within reach   Nurse Communication Mobility status        Time: [id number] OT Time Calculation (min): 17 min  Charges: OT General Charges $OT Visit: 1 Visit OT Treatments $Self Care/Home Management : 8-22 mins  Waymon Amato, MS, OTR/L   Vania Rea 09/28/2022, 3:30 PM

## 2022-09-29 DIAGNOSIS — J9601 Acute respiratory failure with hypoxia: Secondary | ICD-10-CM | POA: Diagnosis not present

## 2022-09-29 NOTE — Plan of Care (Signed)
  Problem: Education: Goal: Ability to demonstrate management of disease process will improve Outcome: Progressing Goal: Ability to verbalize understanding of medication therapies will improve Outcome: Progressing Goal: Individualized Educational Video(s) Outcome: Progressing   Problem: Activity: Goal: Capacity to carry out activities will improve Outcome: Progressing   Problem: Cardiac: Goal: Ability to achieve and maintain adequate cardiopulmonary perfusion will improve Outcome: Progressing   Problem: Education: Goal: Knowledge of risk factors and measures for prevention of condition will improve Outcome: Progressing   Problem: Coping: Goal: Psychosocial and spiritual needs will be supported Outcome: Progressing   Problem: Respiratory: Goal: Will maintain a patent airway Outcome: Progressing Goal: Complications related to the disease process, condition or treatment will be avoided or minimized Outcome: Progressing   Problem: Education: Goal: Knowledge of General Education information will improve Description: Including pain rating scale, medication(s)/side effects and non-pharmacologic comfort measures Outcome: Progressing   Problem: Health Behavior/Discharge Planning: Goal: Ability to manage health-related needs will improve Outcome: Progressing   Problem: Clinical Measurements: Goal: Ability to maintain clinical measurements within normal limits will improve Outcome: Progressing Goal: Will remain free from infection Outcome: Progressing Goal: Diagnostic test results will improve Outcome: Progressing Goal: Respiratory complications will improve Outcome: Progressing Goal: Cardiovascular complication will be avoided Outcome: Progressing   Problem: Activity: Goal: Risk for activity intolerance will decrease Outcome: Progressing   Problem: Nutrition: Goal: Adequate nutrition will be maintained Outcome: Progressing   Problem: Coping: Goal: Level of anxiety  will decrease Outcome: Progressing   Problem: Elimination: Goal: Will not experience complications related to bowel motility Outcome: Progressing Goal: Will not experience complications related to urinary retention Outcome: Progressing   Problem: Pain Managment: Goal: General experience of comfort will improve Outcome: Progressing   Problem: Safety: Goal: Ability to remain free from injury will improve Outcome: Progressing   Problem: Skin Integrity: Goal: Risk for impaired skin integrity will decrease Outcome: Progressing   Problem: Education: Goal: Knowledge of disease or condition will improve Outcome: Progressing Goal: Knowledge of secondary prevention will improve (MUST DOCUMENT ALL) Outcome: Progressing Goal: Knowledge of patient specific risk factors will improve [place]ta [place] N/A or DELETE if not current risk factor) Outcome: Progressing   Problem: Ischemic Stroke/TIA Tissue Perfusion: Goal: Complications of ischemic stroke/TIA will be minimized Outcome: Progressing   Problem: Coping: Goal: Will verbalize positive feelings about self Outcome: Progressing Goal: Will identify appropriate support needs Outcome: Progressing   Problem: Health Behavior/Discharge Planning: Goal: Ability to manage health-related needs will improve Outcome: Progressing Goal: Goals will be collaboratively established with patient/family Outcome: Progressing   Problem: Self-Care: Goal: Ability to participate in self-care as condition permits will improve Outcome: Progressing Goal: Verbalization of feelings and concerns over difficulty with self-care will improve Outcome: Progressing Goal: Ability to communicate needs accurately will improve Outcome: Progressing   Problem: Nutrition: Goal: Risk of aspiration will decrease Outcome: Progressing Goal: Dietary intake will improve Outcome: Progressing

## 2022-09-29 NOTE — Progress Notes (Signed)
Occupational Therapy Treatment Patient Details Name: Gabriela [name] MRN: [id number] DOB: February 13, 1926 Today's Date: 09/29/2022   History of present illness Pt is a 89 y.o. female with medical history significant for HTN, CAD, CKD 3a, DM, MGUS who presents to the ED by EMS with concerns for 3-day history of weakness, confusion, slurred speech and dropping things. MD assessment includes: Acute hypoxic respiratory failure with COVID-19, acute stroke due to ischemia, AKI, and generalized weakness.   OT comments  Pt received seated in recliner. Appearing alert; on 4L O2; willing to work with OT on transfers; co-tx for pt safety.See flowsheet below for further details of session. Left seated in chair with all needs in reach.     Recommendations for follow up therapy are one component of a multi-disciplinary discharge planning process, led by the attending physician.  Recommendations may be updated based on patient status, additional functional criteria and insurance authorization.    Follow Up Recommendations  Skilled nursing-short term rehab (<3 hours/day)     Assistance Recommended at Discharge Frequent or constant Supervision/Assistance  Patient can return home with the following  Two people to help with walking and/or transfers;A lot of help with bathing/dressing/bathroom;Assistance with cooking/housework;Direct supervision/assist for medications management;Direct supervision/assist for financial management;Assist for transportation;Help with stairs or ramp for entrance   Equipment Recommendations  Other (comment) (defer to next venue of care)    Recommendations for Other Services      Precautions / Restrictions Precautions Precautions: Fall Restrictions Weight Bearing Restrictions: No       Mobility Bed Mobility                    Transfers Overall transfer level: Needs assistance Equipment used: Sliding board Transfers: Bed to chair/wheelchair/BSC             Lateral/Scoot Transfers: Max assist, Total assist General transfer comment: Pt received in recliner chair; wanting to stay in recliner by end of session. Pt participating well; needing tactile cues for hand placement (pt wanting to put elbow down on arm rest while pushing to scoot on slideboard); not very effective scooting; continues to need MAX-total assist; only made it halfway to bed on slideboard, then took a break and then scoot back to bed with total assist. Slideboard was tilted slightly uphill for chair to bed t/f; did not seem to make much difference, as pt when returning downhill continued to need dependent assist; fatigues quickly.     Balance   Sitting-balance support: Feet supported Sitting balance-Leahy Scale: Good                                     ADL either performed or assessed with clinical judgement   ADL Overall ADL's : Needs assistance/impaired Eating/Feeding: Set up Eating/Feeding Details (indicate cue type and reason): drank some water and Glucerna during session today when handed to her (with straw)                                        Extremity/Trunk Assessment Upper Extremity Assessment Upper Extremity Assessment: Generalized weakness   Lower Extremity Assessment Lower Extremity Assessment: Generalized weakness;Defer to PT evaluation        Vision       Perception     Praxis      Cognition Arousal/Alertness: Awake/alert Behavior  During Therapy: WFL for tasks assessed/performed Overall Cognitive Status: Within Functional Limits for tasks assessed                                 General Comments: Pleasant and cooperative        Exercises      Shoulder Instructions       General Comments Pt on 4L O2 throughout session; O2 pulseox initially not reading well; later in session showing 97-98% saturation. Pt reporting not feeling short of breath.    Pertinent Vitals/ Pain       Pain  Assessment Pain Assessment: No/denies pain  Home Living                                          Prior Functioning/Environment              Frequency  Min 2X/week        Progress Toward Goals  OT Goals(current goals can now be found in the care plan section)  Progress towards OT goals: Progressing toward goals  Acute Rehab OT Goals Patient Stated Goal: Get better; go home OT Goal Formulation: With patient Time For Goal Achievement: 10/07/22 Potential to Achieve Goals: Fair ADL Goals Pt Will Perform Grooming: with modified independence;sitting Pt Will Perform Upper Body Bathing: with min assist;bed level  Plan Discharge plan remains appropriate    Co-evaluation    PT/OT/SLP Co-Evaluation/Treatment: Yes Reason for Co-Treatment: For patient/therapist safety   OT goals addressed during session: Proper use of [employer] equipment and DME      AM-PAC OT "6 Clicks" Daily Activity     Outcome Measure   Help from another person eating meals?: None Help from another person taking care of personal grooming?: None Help from another person toileting, which includes using toliet, bedpan, or urinal?: Total Help from another person bathing (including washing, rinsing, drying)?: Total Help from another person to put on and taking off regular upper body clothing?: A Little Help from another person to put on and taking off regular lower body clothing?: Total 6 Click Score: 14    End of Session Equipment Utilized During Treatment: Oxygen  OT Visit Diagnosis: Unsteadiness on feet (R26.81);Other abnormalities of gait and mobility (R26.89);Muscle weakness (generalized) (M62.81)   Activity Tolerance Patient limited by fatigue;Patient tolerated treatment well   Patient Left in chair;with call bell/phone within reach (pt able to demonstrate what to do when she needs assist by showing OT the call bell)   Nurse Communication Mobility status        Time:  1321-1401 OT Time Calculation (min): 40 min  Charges: OT General Charges $OT Visit: 1 Visit OT Treatments $Therapeutic Activity: 8-22 mins  Waymon Amato, MS, OTR/L   Vania Rea 09/29/2022, 2:28 PM

## 2022-09-29 NOTE — TOC Progression Note (Signed)
Transition of Care [facility]) - Progression Note    Patient Details  Name: Gabriela [name] MRN: [id number] Date of Birth: 06/30/1926  Transition of [facility] CM/SW [place], [place] Phone Number: 09/29/2022, 10:23 AM  Clinical Narrative:     CSW spoke with patient's daughter Gabriela [name] regarding discharge planning as family had initially wanted patient to go home I fable to board slide independently. [name] aware patient was unable to do so last PT session, agreeable for referrals to be sent out for SNF to identify bed offers. Gabriela [name] is interested in hearing from PT following today's session, PT has been updated on above.   Referrals faxed out for SNF pending bed offers at this time.    Expected Discharge Plan: [place] Barriers to Discharge: Continued Medical Work up  Expected Discharge Plan and [place] arrangements for the past 2 months: [place]: [place]         Social Determinants of Health (SDOH) Interventions SDOH Screenings   Food Insecurity: No Food Insecurity (07/01/2022)  Housing: Low Risk  (07/01/2022)  Transportation Needs: No Transportation Needs (07/01/2022)  Utilities: Not At Risk (07/01/2022)  Alcohol Screen: Low Risk  (09/12/2022)  Depression (PHQ2-9): Low Risk  (09/12/2022)  Financial Resource Strain: Low Risk  (09/07/2021)  Physical Activity: Inactive (09/07/2021)  Social Connections: Moderately Isolated (09/07/2021)  Stress: No Stress Concern Present (09/07/2021)  Tobacco Use: Low Risk  (09/21/2022)    Readmission Risk Interventions     No data to display

## 2022-09-29 NOTE — NC FL2 (Signed)
[place] LEVEL OF CARE FORM     IDENTIFICATION  Patient Name: Gabriela [name] Birthdate: [birth year] Sex: female Admission Date (Current Location): 09/21/2022  [facility] and [place] Number:  Engineering geologist and Address:  [facility], [address], [place], [place] [address]      Provider Number: [id number]  Attending Physician Name and Address:  Shawna Clamp, MD  Relative Name and Phone Number:  [name]  [phone]    Current Level of Care: Hospital Recommended Level of Care: [place] Prior Approval Number:    Date Approved/Denied:   PASRR Number: [id number]  Discharge Plan: SNF    Current Diagnoses: Patient Active Problem List   Diagnosis Date Noted   Acute hypoxic respiratory failure ([place]) 09/25/2022   Acute stroke due to ischemia ([place]) 123456   Chronic systolic CHF (congestive heart failure) ([place]) 09/23/2022   Myocardial injury 09/23/2022   Hx of CABG 09/21/2022   Bilateral pleural effusion 09/21/2022   COVID-19 09/21/2022   Generalized weakness 09/21/2022   COPD exacerbation ([place]) 09/12/2022   Leg swelling 08/10/2022   Type 2 diabetes mellitus with circulatory disorder ([place]) 08/10/2022   Wound infection 07/01/2022   Pneumonia of both lower lobes due to infectious organism 06/28/2022   Atelectasis 06/28/2022   Acute bronchitis 06/28/2022   Labored breathing 06/23/2022   Sacral wound 06/23/2022   Pressure injury of right foot, stage 1 06/23/2022   Pressure injury of skin of ischial area 06/21/2022   Pressure ulcer with suspected deep tissue injury, unstageable ([place]) 06/21/2022   Oth fracture of unsp femur, init encntr for closed fracture ([place]) 06/02/2022   Fever 05/29/2022   Leukocytosis 05/29/2022   Femur fracture, right ([place]) 05/26/2022   Femur fracture, left ([place]) 05/26/2022   CKD (chronic kidney disease) stage 4, GFR 15-29 ml/min ([place]) 05/26/2022   Comminuted fracture of right hip ([place])  05/25/2022   Hypomagnesemia [id number]   Acute metabolic encephalopathy [id number]   Thrombocytopenia ([place]) 04/16/2022   Anemia of chronic disease 04/16/2022   Acute renal failure superimposed on stage 3a chronic kidney disease ([place]) 04/16/2022   MGUS (monoclonal gammopathy of unknown significance) 04/16/2022   Acute right hip pain 04/16/2022   Right hip pain 04/15/2022   Chronic wound 04/15/2022   Encounter for adjustment or removal of myringotomy device (stent) (tube)    Choledocholithiasis    Common bile duct dilatation    Abnormal findings on imaging of biliary tract    History of revision of total replacement of right hip joint 01/20/2016   Abnormal LFTs (liver function tests) 04/17/2015   Anemia of diabetes 04/17/2015   Atherosclerosis of coronary artery 04/17/2015   Diabetic kidney ([place]) 04/17/2015   Essential (primary) hypertension 04/17/2015   Glaucoma 04/17/2015   HLD (hyperlipidemia) 04/17/2015   Disorder of kidney 04/17/2015   Arthritis, degenerative 04/17/2015   Arthritis, hip 04/26/2013   Ischemic mitral regurgitation 04/26/2013   Type 2 diabetes mellitus with renal manifestations (HCC)    Hyperlipidemia     Orientation RESPIRATION BLADDER Height & Weight     Place, Self  O2 (4L nasal cannula) Incontinent, External catheter Weight: 134 lb 0.6 oz (60.8 kg) Height:     BEHAVIORAL SYMPTOMS/MOOD NEUROLOGICAL BOWEL NUTRITION STATUS      Incontinent Diet (see discharge summary)  AMBULATORY STATUS COMMUNICATION OF NEEDS Skin   Extensive Assist Verbally Other (Comment) (pressure injury right heel)  Personal Care Assistance Level of Assistance  Bathing, Feeding, Dressing, Total care Bathing Assistance: Maximum assistance Feeding assistance: Independent Dressing Assistance: Maximum assistance Total Care Assistance: Maximum assistance   Functional Limitations Info  Sight, Hearing, Speech Sight Info: Impaired Hearing Info: Impaired Speech  Info: Adequate    SPECIAL CARE FACTORS FREQUENCY  PT (By licensed PT), OT (By licensed OT)     PT Frequency: min 4x weekly OT Frequency: min 4x weekly            Contractures Contractures Info: Not present    Additional Factors Info  Code Status, Allergies, Isolation Precautions Code Status Info: DNR Allergies Info: oxycodone, prednisone, sulfa antibiotics     Isolation Precautions Info: covid + on 2/7     Current Medications (09/29/2022):  This is the current hospital active medication list Current Facility-Administered Medications  Medication Dose Route Frequency Provider Last Rate Last Admin   acetaminophen (TYLENOL) tablet 650 mg  650 mg Oral Q6H PRN Athena Masse, MD       Or   acetaminophen (TYLENOL) suppository 650 mg  650 mg Rectal Q6H PRN Athena Masse, MD       albuterol (VENTOLIN HFA) 108 (90 Base) MCG/ACT inhaler 2 puff  2 puff Inhalation Q4H PRN Darrick Penna, RPH   2 puff at 09/28/22 1548   brinzolamide (AZOPT) 1 % ophthalmic suspension 1 drop  1 drop Both Eyes BID Judd Gaudier V, MD   1 drop at 09/29/22 0836   And   brimonidine (ALPHAGAN) 0.2 % ophthalmic solution 1 drop  1 drop Both Eyes BID Judd Gaudier V, MD   1 drop at 09/29/22 0836   budesonide (PULMICORT) 180 MCG/ACT inhaler 2 puff  2 puff Inhalation BID Darrick Penna, RPH   2 puff at 09/29/22 [id number]   chlorpheniramine-HYDROcodone ([place]) 10-8 MG/5ML suspension 5 mL  5 mL Oral Q12H PRN Athena Masse, MD       clopidogrel (PLAVIX) tablet 75 mg  75 mg Oral Daily Loletha Grayer, MD   75 mg at 09/29/22 0836   feeding supplement (GLUCERNA SHAKE) (GLUCERNA SHAKE) liquid 237 mL  237 mL Oral TID BM Loletha Grayer, MD   237 mL at 09/28/22 2041   ferrous sulfate tablet 325 mg  325 mg Oral Q breakfast Athena Masse, MD   325 mg at 09/29/22 0836   furosemide (LASIX) tablet 20 mg  20 mg Oral Daily Loletha Grayer, MD   20 mg at 09/29/22 0836   guaiFENesin-dextromethorphan (ROBITUSSIN DM) 100-10  MG/5ML syrup 10 mL  10 mL Oral Q4H PRN Athena Masse, MD       ipratropium (ATROVENT HFA) inhaler 2 puff  2 puff Inhalation Q4H PRN Darrick Penna, RPH   2 puff at 09/28/22 1700   methocarbamol (ROBAXIN) tablet 500 mg  500 mg Oral Q6H PRN Athena Masse, MD       metoprolol succinate (TOPROL-XL) 24 hr tablet 12.5 mg  12.5 mg Oral Daily Judd Gaudier V, MD   12.5 mg at 09/29/22 0835   ondansetron (ZOFRAN) tablet 4 mg  4 mg Oral Q6H PRN Athena Masse, MD       Or   ondansetron [facility]) injection 4 mg  4 mg Intravenous Q6H PRN Athena Masse, MD       potassium chloride (KLOR-CON M) CR tablet 20 mEq  20 mEq Oral Daily Athena Masse, MD   20 mEq at 09/29/22 0835   sodium  chloride flush (NS) 0.9 % injection 10-40 mL  10-40 mL Intracatheter Q12H Wieting, Richard, MD   10 mL at 09/28/22 2219   sodium chloride flush (NS) 0.9 % injection 10-40 mL  10-40 mL Intracatheter PRN Wieting, Richard, MD       timolol (TIMOPTIC) 0.5 % ophthalmic solution 1 drop  1 drop Both Eyes BID Athena Masse, MD   1 drop at 09/29/22 0836   traMADol (ULTRAM) tablet 50 mg  50 mg Oral Q8H PRN Athena Masse, MD         Discharge Medications: Please see discharge summary for a list of discharge medications.  Relevant Imaging Results:  Relevant Lab Results:   Additional Information SSN: [id number]  Tiburcio Bash, LCSW

## 2022-09-29 NOTE — Progress Notes (Signed)
PROGRESS NOTE    Gabriela [name]  [id number] DOB: 1925-09-05 DOA: 09/21/2022  PCP: [name], MD   Brief Narrative:  This 89 y.o. female with PMH significant for HTN, CAD, CKD 3a, DM, MGUS who presents to the ED by EMS with  3-day history of weakness, confusion, slurred speech and dropping things.  Most of the history is given by the granddaughter at the bedside who stated that she noticed that her grandmother has increasingly worsening swelling of the bilateral lower extremities up to the thighs in spite of taking her lasix 10 mg daily.  She has no prior history of CHF and states she was prescribed Lasix by her PCP.  She took her to the nephrologist on 09/15/22 who changed  Lasix to torsemide and ordered compression socks.  She stated that since the medication was changed, her grandmother continue to have swelling and seem to be weaker and she also has developed a cough that has become increasingly more congested.  She is not short of breath,  at baseline she does not move around a lot.  ED course and data review: Afebrile with unremarkable vitals. Labs significant for troponin of 60 and BNP 4115.  WBC 11,500 with hemoglobin 8.8, platelets 51,000. Creatinine 1.57 above baseline of 0.95 a month ago, respiratory viral panel positive for COVID. Chest x-ray showing bilateral pleural effusions with basilar atelectasis. MRI ordered to evaluate for stroke.  Hospitalist consulted for admission. MRI showed a 7 mm acute ischemic nonhemorrhagic right cerebral infarct.   Assessment & Plan:   Principal Problem:   Acute hypoxic respiratory failure (HCC) Active Problems:   Acute stroke due to ischemia (HCC)   Chronic systolic CHF (congestive heart failure) (HCC)   COVID-19   Acute renal failure superimposed on stage 3a chronic kidney disease (HCC)   Bilateral pleural effusion   Acute metabolic encephalopathy   Generalized weakness   Essential (primary) hypertension   Arthritis,  degenerative   MGUS (monoclonal gammopathy of unknown significance)   Type 2 diabetes mellitus with circulatory disorder (HCC)   Myocardial injury  Acute hypoxic respiratory failure: Improved.  SpO2  100% on 4 L. Continue Oxygen supplementation.   Likely secondary to COVID-19.  Continue prednisone and Lasix. Continue nebulized bronchodilators.   Acute stroke due to ischemia: Right cerebellar area.  I do not think she needs dual antiplatelet agent at this time given low platelet count. Currently on Plavix. As per the Neurology benefits of statins are outweighed by the risks of liver damage and myopathy.   Chronic systolic CHF: Last LVEF [id number].  Continue Lasix 20 mg daily. Appears euvolemic.   COVID-19: Continue supportive care. Continue airborne precautions.   Bilateral pleural effusion: Continue to monitor, shortness of breath has improved.   AKI on CKD stage IIIa: Creatinine 1.57 on arrival, above baseline of 0.95 a month prior.   Creatinine 1.11 today.  Resumed Lasix and watch creatinine.   Acute metabolic encephalopathy: Resolved.  Generalized weakness: At baseline, patient is currently mostly sedentary following a bilateral femoral fracture in November 2023 while working with PT. PT recommended SNF. Patient lives alone and has 24-hour family support all the time. As per patient's granddaughter, they will take her home if she does better with board transfers.  Otherwise they will consider rehab. TOC notified and  family agreed for SNF placement.   Elevated troponin: Troponin slightly elevated but we can get this with a stroke also.   Type 2 diabetes mellitus: With her hands and feet  being cold I do not think the fingersticks are accurate at this time.   Hb A1c only 6.8.  Will continue to watch sugars with daily chemistry.   MGUS (monoclonal gammopathy of unknown significance) Anemia and thrombocytopenia: Last hemoglobin 10.2 and last platelet count 41. Continue  to  monitor.   Osteoarthritis: Continue Tylenol and Robaxin with tramadol as needed.   Essential hypertension: Continue metoprolol.   DVT prophylaxis:  SCDs Code Status: DNR Family Communication: Spoke with daughter on phone. Disposition Plan:    Status is: Inpatient Remains inpatient appropriate because: Admitted for acute hypoxic respiratory failure in the setting of COVID-19 infection.  PT and OT recommended SNF but family wants to take her home with home health services.  Patient has 24-hour care.  Patient needs to transfer from bed to chair before family can take her home. TOC notified.  Looking for SNF place.  Consultants:  None.  Procedures: None.  Antimicrobials:  Anti-infectives (From admission, onward)    Start     Dose/Rate Route Frequency Ordered Stop   09/23/22 1000  remdesivir 100 mg in sodium chloride 0.9 % 100 mL IVPB  Status:  Discontinued       See Hyperspace for full Linked Orders Report.   100 mg 200 mL/hr over 30 Minutes Intravenous Daily 09/22/22 0000 09/22/22 0840   09/22/22 0430  remdesivir 100 mg in sodium chloride 0.9 % 100 mL IVPB       See Hyperspace for full Linked Orders Report.   100 mg 200 mL/hr over 30 Minutes Intravenous  Once 09/22/22 0000 09/23/22 0700   09/22/22 0400  remdesivir 100 mg in sodium chloride 0.9 % 100 mL IVPB       See Hyperspace for full Linked Orders Report.   100 mg 200 mL/hr over 30 Minutes Intravenous  Once 09/22/22 0000 09/22/22 0456      Subjective: Patient was seen and examined at bedside. Overnight events noted.   Patient reports feeling weak but appears much better, sitting comfortably on the chair. She wants to participate in physical therapy.  Objective: Vitals:   09/28/22 2222 09/29/22 0006 09/29/22 0408 09/29/22 0833  BP: (!) 122/97 (!) 134/94 (!) 119/56 (!) 124/53  Pulse: 75 74 73 91  Resp: (!) 22 16 20 18  $ Temp: 97.8 F (36.6 C) 97.6 F (36.4 C) 98.2 F (36.8 C) 97.9 F (36.6 C)  TempSrc: Oral Oral  Oral Oral  SpO2: 99% 100% 100% 100%  Weight:        Intake/Output Summary (Last 24 hours) at 09/29/2022 1342 Last data filed at 09/29/2022 0300 Gross per 24 hour  Intake 210 ml  Output 150 ml  Net 60 ml   Filed Weights   09/24/22 0758 09/25/22 0837 09/26/22 0903  Weight: 59.9 kg 58.4 kg 60.8 kg    Examination:  General exam: Appears comfortable, not in any acute distress.  Deconditioned Respiratory system: CTA bilaterally, Respiratory effort normal.  RR 16. Cardiovascular system: S1 & S2 heard, regular rate and rhythm, no murmur. Gastrointestinal system: Abdomen is soft, non tender, nondistended, BS+ Central nervous system: Alert and oriented x 2, no focal neurological deficits. Extremities: No edema, no cyanosis, no clubbing Skin: No rashes, lesions or ulcers Psychiatry:  Mood & affect appropriate.     Data Reviewed: I have personally reviewed following labs and imaging studies  CBC: Recent Labs  Lab 09/24/22 0529 09/26/22 0632 09/27/22 0546 09/28/22 0731  WBC 7.6 11.4* 14.3* 10.0  HGB 10.2* 9.2* 8.4*  9.0*  HCT 33.4* 29.8* 27.4* 29.5*  MCV 81.9 79.5* 79.7* 79.9*  PLT 41* 37* 39* 39*   Basic Metabolic Panel: Recent Labs  Lab 09/24/22 0529 09/25/22 0640 09/26/22 0421 09/27/22 0546 09/28/22 0731  NA 139 145 143 147* 145  K 4.0 3.5 4.0 4.7 4.4  CL 106 115* 113* 112* 113*  CO2 25 25 21* 26 25  GLUCOSE 156* 98 199* 127* 160*  BUN 35* 31* 33* 34* 37*  CREATININE 1.45* 1.17* 1.19* 1.14* 1.11*  CALCIUM 8.1* 7.9* 7.5* 8.1* 8.0*  MG  --   --   --  1.9 2.0  PHOS  --   --   --  2.8 3.1   GFR: Estimated Creatinine Clearance: 26.8 mL/min (A) (by C-G formula based on SCr of 1.11 mg/dL (H)). Liver Function Tests: No results for input(s): "AST", "ALT", "ALKPHOS", "BILITOT", "PROT", "ALBUMIN" in the last 168 hours.  No results for input(s): "LIPASE", "AMYLASE" in the last 168 hours. No results for input(s): "AMMONIA" in the last 168 hours. Coagulation Profile: No  results for input(s): "INR", "PROTIME" in the last 168 hours. Cardiac Enzymes: No results for input(s): "CKTOTAL", "CKMB", "CKMBINDEX", "TROPONINI" in the last 168 hours. BNP (last 3 results) No results for input(s): "PROBNP" in the last 8760 hours. HbA1C: No results for input(s): "HGBA1C" in the last 72 hours. CBG: Recent Labs  Lab 09/23/22 0859 09/23/22 1018 09/23/22 1656 09/23/22 2127 09/24/22 2125  GLUCAP 85 30* 140* 231* 183*   Lipid Profile: No results for input(s): "CHOL", "HDL", "LDLCALC", "TRIG", "CHOLHDL", "LDLDIRECT" in the last 72 hours.  Thyroid Function Tests: No results for input(s): "TSH", "T4TOTAL", "FREET4", "T3FREE", "THYROIDAB" in the last 72 hours. Anemia Panel: No results for input(s): "VITAMINB12", "FOLATE", "FERRITIN", "TIBC", "IRON", "RETICCTPCT" in the last 72 hours. Sepsis Labs: No results for input(s): "PROCALCITON", "LATICACIDVEN" in the last 168 hours.  Recent Results (from the past 240 hour(s))  Resp panel by RT-PCR (RSV, Flu A&B, Covid) Anterior Nasal Swab     Status: Abnormal   Collection Time: 09/21/22  8:33 PM   Specimen: Anterior Nasal Swab  Result Value Ref Range Status   SARS Coronavirus 2 by RT PCR POSITIVE (A) NEGATIVE Final    Comment: (NOTE) SARS-CoV-2 target nucleic acids are DETECTED.  The SARS-CoV-2 RNA is generally detectable in upper respiratory specimens during the acute phase of infection. Positive results are indicative of the presence of the identified virus, but do not rule out bacterial infection or co-infection with other pathogens not detected by the test. Clinical correlation with patient history and other diagnostic information is necessary to determine patient infection status. The expected result is Negative.  Fact Sheet for Patients: EntrepreneurPulse.com.au  Fact Sheet for Healthcare Providers: IncredibleEmployment.be  This test is not yet approved or cleared by the [place] FDA and  has been authorized for detection and/or diagnosis of SARS-CoV-2 by FDA under an Emergency Use Authorization (EUA).  This EUA will remain in effect (meaning this test can be used) for the duration of  the COVID-19 declaration under Section 564(b)(1) of the A ct, 21 U.S.C. section 360bbb-3(b)(1), unless the authorization is terminated or revoked sooner.     Influenza A by PCR NEGATIVE NEGATIVE Final   Influenza B by PCR NEGATIVE NEGATIVE Final    Comment: (NOTE) The Xpert Xpress SARS-CoV-2/FLU/RSV plus assay is intended as an aid in the diagnosis of influenza from Nasopharyngeal swab specimens and should not be used as a sole basis for treatment.  Nasal washings and aspirates are unacceptable for Xpert Xpress SARS-CoV-2/FLU/RSV testing.  Fact Sheet for Patients: EntrepreneurPulse.com.au  Fact Sheet for Healthcare Providers: IncredibleEmployment.be  This test is not yet approved or cleared by the [place] FDA and has been authorized for detection and/or diagnosis of SARS-CoV-2 by FDA under an Emergency Use Authorization (EUA). This EUA will remain in effect (meaning this test can be used) for the duration of the COVID-19 declaration under Section 564(b)(1) of the Act, 21 U.S.C. section 360bbb-3(b)(1), unless the authorization is terminated or revoked.     Resp Syncytial Virus by PCR NEGATIVE NEGATIVE Final    Comment: (NOTE) Fact Sheet for Patients: EntrepreneurPulse.com.au  Fact Sheet for Healthcare Providers: IncredibleEmployment.be  This test is not yet approved or cleared by the [place] FDA and has been authorized for detection and/or diagnosis of SARS-CoV-2 by FDA under an Emergency Use Authorization (EUA). This EUA will remain in effect (meaning this test can be used) for the duration of the COVID-19 declaration under Section 564(b)(1) of the Act, 21 U.S.C. section  360bbb-3(b)(1), unless the authorization is terminated or revoked.  Performed at [facility], [address] [place], [place] [address]     Radiology Studies: No results found.  Scheduled Meds:  brinzolamide  1 drop Both Eyes BID   And   brimonidine  1 drop Both Eyes BID   budesonide  2 puff Inhalation BID   clopidogrel  75 mg Oral Daily   feeding supplement (GLUCERNA SHAKE)  237 mL Oral TID BM   ferrous sulfate  325 mg Oral Q breakfast   furosemide  20 mg Oral Daily   metoprolol succinate  12.5 mg Oral Daily   potassium chloride  20 mEq Oral Daily   sodium chloride flush  10-40 mL Intracatheter Q12H   timolol  1 drop Both Eyes BID   Continuous Infusions:   LOS: 8 days    Time spent: 35 min    Hazle Ogburn, MD [facility]   If 7PM-7AM, please contact night-coverage

## 2022-09-29 NOTE — Progress Notes (Signed)
Physical Therapy Treatment Patient Details Name: Gabriela [name] MRN: [id number] DOB: 11/22/1925 Today's Date: 09/29/2022   History of Present Illness Pt is a 89 y.o. female with medical history significant for HTN, CAD, CKD 3a, DM, MGUS who presents to the ED by EMS with concerns for 3-day history of weakness, confusion, slurred speech and dropping things. MD assessment includes: Acute hypoxic respiratory failure with COVID-19, acute stroke due to ischemia, AKI, and generalized weakness.    PT Comments    Pt pleasant and eager to try slide board mobility but she was very limited with how much effort/assist she was able to give.  Pt with ability to maintain balance and attempt forward weight shift but struggled with any real ability to use UEs/LEs to actually side scoot either way whether "up hill" or "down hill."  Unsure to what degree pt is able to do/assist with mobility at baseline but today she needed heavy assist to get any real functional movement/weight shift to happen.   Will benefit from continued PT per POC.   Recommendations for follow up therapy are one component of a multi-disciplinary discharge planning process, led by the attending physician.  Recommendations may be updated based on patient status, additional functional criteria and insurance authorization.  Follow Up Recommendations  Skilled nursing-short term rehab (<3 hours/day) Can patient physically be transported by private vehicle: No   Assistance Recommended at Discharge Frequent or constant Supervision/Assistance  Patient can return home with the following Assist for transportation;Direct supervision/assist for medications management;Assistance with cooking/housework;Two people to help with walking and/or transfers;A lot of help with bathing/dressing/bathroom   Equipment Recommendations  None recommended by PT    Recommendations for Other Services       Precautions / Restrictions Precautions Precautions:  Fall Restrictions Weight Bearing Restrictions: No     Mobility  Bed Mobility               General bed mobility comments: not tested; pt in recliner pre/post session    Transfers Overall transfer level: Needs assistance Equipment used: Sliding board Transfers: Bed to chair/wheelchair/BSC            Lateral/Scoot Transfers: Max assist, Total assist General transfer comment: Pt received in recliner chair; wanting to stay in recliner by end of session. Pt participating well; needing tactile cues for hand placement (pt wanting to put elbow down on arm rest while pushing to scoot on slideboard); not very effective scooting; continues to need MAX-total assist; only made it halfway to bed on slideboard, then took a break and then scoot back to bed with total assist. Slideboard was tilted slightly uphill for chair to bed t/f; did not seem to make much difference, as pt when returning downhill continued to need dependent assist; fatigues quickly.    Ambulation/Gait               General Gait Details: unable   Stairs             Wheelchair Mobility    Modified Rankin (Stroke Patients Only)       Balance Overall balance assessment: Needs assistance Sitting-balance support: Feet supported Sitting balance-Leahy Scale: Good Sitting balance - Comments: Pt tending to want to lean on elbows but able to maintain upright sitting w/o heavy UE assist when cued     Standing balance-Leahy Scale: Zero  Cognition Arousal/Alertness: Awake/alert Behavior During Therapy: WFL for tasks assessed/performed Overall Cognitive Status: Within Functional Limits for tasks assessed                                          Exercises      General Comments General comments (skin integrity, edema, etc.): 4L O2, t/o session, pt with some SOB during activity - difficulty getting pulseox reading but appears to be in the 90s when  getting plausible signal      Pertinent Vitals/Pain Pain Assessment Pain Assessment: No/denies pain    Home Living                          Prior Function            PT Goals (current goals can now be found in the care plan section) Progress towards PT goals: Progressing toward goals    Frequency    Min 2X/week      PT Plan Current plan remains appropriate    Co-evaluation PT/OT/SLP Co-Evaluation/Treatment: Yes Reason for Co-Treatment: For patient/therapist safety PT goals addressed during session: Mobility/safety with mobility;Balance;Proper use of DME OT goals addressed during session: Proper use of Adaptive equipment and DME      AM-PAC PT "6 Clicks" Mobility   Outcome Measure  Help needed turning from your back to your side while in a flat bed without using bedrails?: A Little Help needed moving from lying on your back to sitting on the side of a flat bed without using bedrails?: A Little Help needed moving to and from a bed to a chair (including a wheelchair)?: Total Help needed standing up from a chair using your arms (e.g., wheelchair or bedside chair)?: Total Help needed to walk in hospital room?: Total Help needed climbing 3-5 steps with a railing? : Total 6 Click Score: 10    End of Session Equipment Utilized During Treatment: Gait belt Activity Tolerance: Patient tolerated treatment well Patient left: in chair;with call bell/phone within reach Nurse Communication: Mobility status PT Visit Diagnosis: Other abnormalities of gait and mobility (R26.89);Muscle weakness (generalized) (M62.81)     Time: [id number] co-treat (OT) PT Time Calculation (min) (ACUTE ONLY): 33 min  Charges:  $Therapeutic Activity: 8-22 mins                     Kreg Shropshire, DPT 09/29/2022, 3:45 PM

## 2022-09-30 ENCOUNTER — Inpatient Hospital Stay: Payer: 59

## 2022-09-30 DIAGNOSIS — J9601 Acute respiratory failure with hypoxia: Secondary | ICD-10-CM | POA: Diagnosis not present

## 2022-09-30 MED ORDER — CLOPIDOGREL BISULFATE 75 MG PO TABS: 75.0000 mg | ORAL_TABLET | Freq: Every day | ORAL | 6 refills | Status: DC

## 2022-09-30 NOTE — Progress Notes (Signed)
OT Cancellation Note  Patient Details Name: SUPRENA SACHDEVA MRN: [id number] DOB: 07-08-1926   Cancelled Treatment:    Reason Eval/Treat Not Completed: Other (comment) OT arrived with pt sitting up in chair with a full breakfast tray.  Pt stating that she is still trying to eat.    Leta Speller, MS, OTR/L   Darleene Cleaver 09/30/2022, 9:04 AM

## 2022-09-30 NOTE — TOC Progression Note (Addendum)
Transition of Care [facility]) - Progression Note    Patient Details  Name: Gabriela [name] MRN: [id number] Date of Birth: Mar 18, 1926  [facility] CM/SW [place], [place] Phone Number: 09/30/2022, 10:24 AM  Clinical Narrative:     Update: [name] called back, preference for [facility], Magda Paganini at [facility] updated and insurance auth started. [name] requests call from MD to discuss her concerns with patient's respiratory status, MD informed.    CSW lvm with patient's granddaughter [name] with the three bed offers patient has to choose from, pending call back with choice.   Expected Discharge Plan: [place] Barriers to Discharge: Continued Medical Work up  Expected Discharge Plan and [place] arrangements for the past 2 months: [place]: [place]         Social Determinants of Health (SDOH) Interventions SDOH Screenings   Food Insecurity: No Food Insecurity (07/01/2022)  Housing: Low Risk  (07/01/2022)  Transportation Needs: No Transportation Needs (07/01/2022)  Utilities: Not At Risk (07/01/2022)  Alcohol Screen: Low Risk  (09/12/2022)  Depression (PHQ2-9): Low Risk  (09/12/2022)  Financial Resource Strain: Low Risk  (09/07/2021)  Physical Activity: Inactive (09/07/2021)  Social Connections: Moderately Isolated (09/07/2021)  Stress: No Stress Concern Present (09/07/2021)  Tobacco Use: Low Risk  (09/21/2022)    Readmission Risk Interventions     No data to display

## 2022-09-30 NOTE — Discharge Instructions (Signed)
Advised to take Plavix 75 mg daily for stroke. Advised to discontinue aspirin. Advised to continue other medications as prescribed.

## 2022-09-30 NOTE — Progress Notes (Signed)
PROGRESS NOTE    Gabriela [name]  [id number] DOB: Jan 16, 1926 DOA: 09/21/2022  PCP: [name], MD   Brief Narrative:  This 89 y.o. female with PMH significant for HTN, CAD, CKD 3a, DM, MGUS who presents to the ED by EMS with  3-day history of weakness, confusion, slurred speech and dropping things.  Most of the history is given by the granddaughter at the bedside who stated that she noticed that her grandmother has increasingly worsening swelling of the bilateral lower extremities up to the thighs in spite of taking her lasix 10 mg daily.  She has no prior history of CHF and states she was prescribed Lasix by her PCP.  She took her to the nephrologist on 09/15/22 who changed  Lasix to torsemide and ordered compression socks.  She stated that since the medication was changed, her grandmother continue to have swelling and seem to be weaker and she also has developed a cough that has become increasingly more congested.  She is not short of breath,  at baseline she does not move around a lot.  ED course and data review: Afebrile with unremarkable vitals. Labs significant for troponin of 60 and BNP 4115.  WBC 11,500 with hemoglobin 8.8, platelets 51,000. Creatinine 1.57 above baseline of 0.95 a month ago, respiratory viral panel positive for COVID. Chest x-ray showing bilateral pleural effusions with basilar atelectasis. MRI ordered to evaluate for stroke.  Hospitalist consulted for admission. MRI showed a 7 mm acute ischemic nonhemorrhagic right cerebral infarct.   Assessment & Plan:   Principal Problem:   Acute hypoxic respiratory failure (HCC) Active Problems:   Acute stroke due to ischemia (HCC)   Chronic systolic CHF (congestive heart failure) (HCC)   COVID-19   Acute renal failure superimposed on stage 3a chronic kidney disease (HCC)   Bilateral pleural effusion   Acute metabolic encephalopathy   Generalized weakness   Essential (primary) hypertension   Arthritis,  degenerative   MGUS (monoclonal gammopathy of unknown significance)   Type 2 diabetes mellitus with circulatory disorder (HCC)   Myocardial injury  Acute hypoxic respiratory failure: Improved.  SpO2  100% on 3 L. Continue Oxygen supplementation.   Likely secondary to COVID-19.  Continue prednisone and Lasix. Continue nebulized bronchodilators.   Acute stroke due to ischemia: Right cerebellar area.  I do not think she needs dual antiplatelet agent at this time given low platelet count. Currently on Plavix. As per the Neurology benefits of statins are outweighed by the risks of liver damage and myopathy.   Chronic systolic CHF: Last LVEF [id number].  Continue Lasix 20 mg daily. Appears euvolemic.   COVID-19: Continue supportive care. Continue airborne precautions.   Bilateral pleural effusion: Continue to monitor, shortness of breath has improved.   AKI on CKD stage IIIa: Creatinine 1.57 on arrival, above baseline of 0.95 a month prior.   Creatinine 1.11 today.  Resumed Lasix and watch creatinine.   Acute metabolic encephalopathy: Resolved.  Generalized weakness: At baseline, patient is currently mostly sedentary following a bilateral femoral fracture in November 2023 while working with PT. PT recommended SNF. Patient lives alone and has 24-hour family support all the time. As per patient's granddaughter, they will take her home if she does better with board transfers.  Otherwise they will consider rehab. TOC notified and  family agreed for SNF placement.   Elevated troponin: Troponin slightly elevated but we can get this with a stroke also.   Type 2 diabetes mellitus: With her hands and feet  being cold I do not think the fingersticks are accurate at this time.   Hb A1c only 6.8.  Will continue to watch sugars with daily chemistry.   MGUS (monoclonal gammopathy of unknown significance) Anemia and thrombocytopenia: Last hemoglobin 10.2 and last platelet count 41. Continue  to  monitor.   Osteoarthritis: Continue Tylenol and Robaxin with tramadol as needed.   Essential hypertension: Continue metoprolol.   DVT prophylaxis:  SCDs Code Status: DNR Family Communication: Spoke with daughter on phone. Disposition Plan:    Status is: Inpatient Remains inpatient appropriate because: Admitted for acute hypoxic respiratory failure in the setting of COVID-19 infection.  PT and OT recommended SNF but family wants to take her home with home health services.  Patient has 24-hour care.  Patient needs to transfer from bed to chair before family can take her home. TOC notified.  Looking for SNF place.  Medically clear,  awaiting SNF authorization  Consultants:  None.  Procedures: None.  Antimicrobials:  Anti-infectives (From admission, onward)    Start     Dose/Rate Route Frequency Ordered Stop   09/23/22 1000  remdesivir 100 mg in sodium chloride 0.9 % 100 mL IVPB  Status:  Discontinued       See Hyperspace for full Linked Orders Report.   100 mg 200 mL/hr over 30 Minutes Intravenous Daily 09/22/22 0000 09/22/22 0840   09/22/22 0430  remdesivir 100 mg in sodium chloride 0.9 % 100 mL IVPB       See Hyperspace for full Linked Orders Report.   100 mg 200 mL/hr over 30 Minutes Intravenous  Once 09/22/22 0000 09/23/22 0700   09/22/22 0400  remdesivir 100 mg in sodium chloride 0.9 % 100 mL IVPB       See Hyperspace for full Linked Orders Report.   100 mg 200 mL/hr over 30 Minutes Intravenous  Once 09/22/22 0000 09/22/22 0456      Subjective: Patient was seen and examined at bedside. Overnight events noted.   Patient reports feeling weak, but appears much comfortable.   She was sitting on the chair remains on 3 L of supplemental oxygen.  Objective: Vitals:   09/29/22 2339 09/30/22 0337 09/30/22 0751 09/30/22 1148  BP: (!) 143/63 (!) 145/74 (!) 149/62 (!) 132/58  Pulse: 70 68 73 73  Resp: 18 20 16 16  $ Temp: 98.2 F (36.8 C) 97.6 F (36.4 C) 98 F (36.7 C)  97.6 F (36.4 C)  TempSrc:  Oral  Oral  SpO2: 100% 99% 98% 94%  Weight:        Intake/Output Summary (Last 24 hours) at 09/30/2022 1345 Last data filed at 09/30/2022 0400 Gross per 24 hour  Intake 320 ml  Output 450 ml  Net -130 ml   Filed Weights   09/25/22 0837 09/26/22 0903 09/29/22 1553  Weight: 58.4 kg 60.8 kg 56.4 kg    Examination:  General exam: Appears comfortable, not in any acute distress.  Deconditioned. Respiratory system: CTA bilaterally, Respiratory effort normal.  RR 16. Cardiovascular system: S1 & S2 heard, regular rate and rhythm, no murmur. Gastrointestinal system: Abdomen is soft, non tender, nondistended, BS+ Central nervous system: Alert and oriented x 2, no focal neurological deficits. Extremities: No edema, no cyanosis, no clubbing Skin: No rashes, lesions or ulcers Psychiatry:  Mood & affect appropriate.     Data Reviewed: I have personally reviewed following labs and imaging studies  CBC: Recent Labs  Lab 09/24/22 0529 09/26/22 0632 09/27/22 0546 09/28/22 0731  WBC  7.6 11.4* 14.3* 10.0  HGB 10.2* 9.2* 8.4* 9.0*  HCT 33.4* 29.8* 27.4* 29.5*  MCV 81.9 79.5* 79.7* 79.9*  PLT 41* 37* 39* 39*   Basic Metabolic Panel: Recent Labs  Lab 09/24/22 0529 09/25/22 0640 09/26/22 0421 09/27/22 0546 09/28/22 0731  NA 139 145 143 147* 145  K 4.0 3.5 4.0 4.7 4.4  CL 106 115* 113* 112* 113*  CO2 25 25 21* 26 25  GLUCOSE 156* 98 199* 127* 160*  BUN 35* 31* 33* 34* 37*  CREATININE 1.45* 1.17* 1.19* 1.14* 1.11*  CALCIUM 8.1* 7.9* 7.5* 8.1* 8.0*  MG  --   --   --  1.9 2.0  PHOS  --   --   --  2.8 3.1   GFR: Estimated Creatinine Clearance: 26.8 mL/min (A) (by C-G formula based on SCr of 1.11 mg/dL (H)). Liver Function Tests: No results for input(s): "AST", "ALT", "ALKPHOS", "BILITOT", "PROT", "ALBUMIN" in the last 168 hours.  No results for input(s): "LIPASE", "AMYLASE" in the last 168 hours. No results for input(s): "AMMONIA" in the last 168  hours. Coagulation Profile: No results for input(s): "INR", "PROTIME" in the last 168 hours. Cardiac Enzymes: No results for input(s): "CKTOTAL", "CKMB", "CKMBINDEX", "TROPONINI" in the last 168 hours. BNP (last 3 results) No results for input(s): "PROBNP" in the last 8760 hours. HbA1C: No results for input(s): "HGBA1C" in the last 72 hours. CBG: Recent Labs  Lab 09/23/22 1656 09/23/22 2127 09/24/22 2125  GLUCAP 140* 231* 183*   Lipid Profile: No results for input(s): "CHOL", "HDL", "LDLCALC", "TRIG", "CHOLHDL", "LDLDIRECT" in the last 72 hours.  Thyroid Function Tests: No results for input(s): "TSH", "T4TOTAL", "FREET4", "T3FREE", "THYROIDAB" in the last 72 hours. Anemia Panel: No results for input(s): "VITAMINB12", "FOLATE", "FERRITIN", "TIBC", "IRON", "RETICCTPCT" in the last 72 hours. Sepsis Labs: No results for input(s): "PROCALCITON", "LATICACIDVEN" in the last 168 hours.  Recent Results (from the past 240 hour(s))  Resp panel by RT-PCR (RSV, Flu A&B, Covid) Anterior Nasal Swab     Status: Abnormal   Collection Time: 09/21/22  8:33 PM   Specimen: Anterior Nasal Swab  Result Value Ref Range Status   SARS Coronavirus 2 by RT PCR POSITIVE (A) NEGATIVE Final    Comment: (NOTE) SARS-CoV-2 target nucleic acids are DETECTED.  The SARS-CoV-2 RNA is generally detectable in upper respiratory specimens during the acute phase of infection. Positive results are indicative of the presence of the identified virus, but do not rule out bacterial infection or co-infection with other pathogens not detected by the test. Clinical correlation with patient history and other diagnostic information is necessary to determine patient infection status. The expected result is Negative.  Fact Sheet for Patients: EntrepreneurPulse.com.au  Fact Sheet for Healthcare Providers: IncredibleEmployment.be  This test is not yet approved or cleared by the [place] FDA and  has been authorized for detection and/or diagnosis of SARS-CoV-2 by FDA under an Emergency Use Authorization (EUA).  This EUA will remain in effect (meaning this test can be used) for the duration of  the COVID-19 declaration under Section 564(b)(1) of the A ct, 21 U.S.C. section 360bbb-3(b)(1), unless the authorization is terminated or revoked sooner.     Influenza A by PCR NEGATIVE NEGATIVE Final   Influenza B by PCR NEGATIVE NEGATIVE Final    Comment: (NOTE) The Xpert Xpress SARS-CoV-2/FLU/RSV plus assay is intended as an aid in the diagnosis of influenza from Nasopharyngeal swab specimens and should not be used as a sole  basis for treatment. Nasal washings and aspirates are unacceptable for Xpert Xpress SARS-CoV-2/FLU/RSV testing.  Fact Sheet for Patients: EntrepreneurPulse.com.au  Fact Sheet for Healthcare Providers: IncredibleEmployment.be  This test is not yet approved or cleared by the [place] FDA and has been authorized for detection and/or diagnosis of SARS-CoV-2 by FDA under an Emergency Use Authorization (EUA). This EUA will remain in effect (meaning this test can be used) for the duration of the COVID-19 declaration under Section 564(b)(1) of the Act, 21 U.S.C. section 360bbb-3(b)(1), unless the authorization is terminated or revoked.     Resp Syncytial Virus by PCR NEGATIVE NEGATIVE Final    Comment: (NOTE) Fact Sheet for Patients: EntrepreneurPulse.com.au  Fact Sheet for Healthcare Providers: IncredibleEmployment.be  This test is not yet approved or cleared by the [place] FDA and has been authorized for detection and/or diagnosis of SARS-CoV-2 by FDA under an Emergency Use Authorization (EUA). This EUA will remain in effect (meaning this test can be used) for the duration of the COVID-19 declaration under Section 564(b)(1) of the Act, 21 U.S.C. section  360bbb-3(b)(1), unless the authorization is terminated or revoked.  Performed at [facility], [address] [place],  [address]     Radiology Studies: No results found.  Scheduled Meds:  brinzolamide  1 drop Both Eyes BID   And   brimonidine  1 drop Both Eyes BID   budesonide  2 puff Inhalation BID   clopidogrel  75 mg Oral Daily   feeding supplement (GLUCERNA SHAKE)  237 mL Oral TID BM   ferrous sulfate  325 mg Oral Q breakfast   furosemide  20 mg Oral Daily   metoprolol succinate  12.5 mg Oral Daily   potassium chloride  20 mEq Oral Daily   sodium chloride flush  10-40 mL Intracatheter Q12H   timolol  1 drop Both Eyes BID   Continuous Infusions:   LOS: 9 days    Time spent: 35 min    Arionna Hoggard, MD [facility]   If 7PM-7AM, please contact night-coverage

## 2022-09-30 NOTE — Discharge Summary (Incomplete)
Physician Discharge Summary  Marvene Lashway Matzen [id number] DOB: 07/09/1926 DOA: 09/21/2022  PCP: [name], MD  Admit date: 09/21/2022  Discharge date: 09/30/2022  Admitted From: Home  Disposition:  SNF  Recommendations for Outpatient Follow-up:  Follow up with PCP in 1-2 weeks. Please obtain BMP/CBC in one week. Advised to take Plavix 75 mg daily for stroke. Advised to discontinue aspirin. Advised to continue other medications as prescribed.  Home Health: None Equipment/Devices:Home oxygen @ 3L/min  Discharge Condition: Stable CODE STATUS:Full code Diet recommendation: Heart Healthy   Brief [facility] Course: This 89 y.o. female with PMH significant for HTN, CAD, CKD 3a, DM, MGUS who presents to the ED by EMS with  3-day history of weakness, confusion, slurred speech and dropping things.  Most of the history is given by the granddaughter at the bedside who stated that she noticed that her grandmother has increasingly worsening swelling of the bilateral lower extremities up to the thighs in spite of taking her lasix 10 mg daily.  She has no prior history of CHF and states she was prescribed Lasix by her PCP.  She took her to the nephrologist on 09/15/22 who changed  Lasix to torsemide and ordered compression socks.  She stated that since the medication was changed, her grandmother continue to have swelling and seem to be weaker and she also has developed a cough that has become increasingly more congested.  She is not short of breath,  at baseline she does not move around a lot.  ED course and data review: Afebrile with unremarkable vitals. Labs significant for troponin of 60 and BNP 4115.  WBC 11,500 with hemoglobin 8.8, platelets 51,000. Creatinine 1.57 above baseline of 0.95 a month ago, respiratory viral panel positive for COVID. Chest x-ray showing bilateral pleural effusions with basilar atelectasis. MRI ordered to evaluate for stroke.  Hospitalist consulted for  admission. MRI showed a 7 mm acute ischemic nonhemorrhagic right cerebral infarct   Discharge Diagnoses:  Principal Problem:   Acute hypoxic respiratory failure (HCC) Active Problems:   Acute stroke due to ischemia (HCC)   Chronic systolic CHF (congestive heart failure) (HCC)   COVID-19   Acute renal failure superimposed on stage 3a chronic kidney disease (HCC)   Bilateral pleural effusion   Acute metabolic encephalopathy   Generalized weakness   Essential (primary) hypertension   Arthritis, degenerative   MGUS (monoclonal gammopathy of unknown significance)   Type 2 diabetes mellitus with circulatory disorder (HCC)   Myocardial injury  Acute hypoxic respiratory failure: Improved.  SpO2  100% on 4 L. Continue Oxygen supplementation.   Likely secondary to COVID-19.  Continue prednisone and Lasix. Continue nebulized bronchodilators.   Acute stroke due to ischemia: Right cerebellar area.  I do not think she needs dual antiplatelet agent at this time given low platelet count. Currently on Plavix. As per the Neurology benefits of statins are outweighed by the risks of liver damage and myopathy.   Chronic systolic CHF: Last LVEF [id number].  Continue Lasix 20 mg daily. Appears euvolemic.   COVID-19: Continue supportive care. Continue airborne precautions.   Bilateral pleural effusion: Continue to monitor, shortness of breath has improved.   AKI on CKD stage IIIa: Creatinine 1.57 on arrival, above baseline of 0.95 a month prior.   Creatinine 1.11 today.  Resumed Lasix and watch creatinine.   Acute metabolic encephalopathy: Resolved.   Generalized weakness: At baseline, patient is currently mostly sedentary following a bilateral femoral fracture in November 2023 while working with PT.  PT recommended SNF. Patient lives alone and has 24-hour family support all the time. As per patient's granddaughter, they will take her home if she does better with board transfers.  Otherwise they  will consider rehab. TOC notified and  family agreed for SNF placement.   Elevated troponin: Troponin slightly elevated but we can get this with a stroke also.   Type 2 diabetes mellitus: With her hands and feet being cold I do not think the fingersticks are accurate at this time.   Hb A1c only 6.8.  Will continue to watch sugars with daily chemistry.   MGUS (monoclonal gammopathy of unknown significance) Anemia and thrombocytopenia: Last hemoglobin 10.2 and last platelet count 41. Continue  to monitor.   Osteoarthritis: Continue Tylenol and Robaxin with tramadol as needed.   Essential hypertension: Continue metoprolol.  Discharge Instructions  Discharge Instructions     Call MD for:  difficulty breathing, headache or visual disturbances   Complete by: As directed    Call MD for:  persistant dizziness or light-headedness   Complete by: As directed    Call MD for:  persistant nausea and vomiting   Complete by: As directed    Diet - low sodium heart healthy   Complete by: As directed    Diet Carb Modified   Complete by: As directed    Discharge instructions   Complete by: As directed    Advised to follow-up with primary care physician in 1 week. Advised to take Plavix 75 mg daily for stroke. Advised to discontinue aspirin. Advised to continue other medications as prescribed.   Discharge wound care:   Complete by: As directed    Follow up wound care in SNF   Increase activity slowly   Complete by: As directed       Allergies as of 09/30/2022       Reactions   Oxycodone Other (See Comments)   Patient cannot tolerate more than 2 doses.   Prednisone Other (See Comments)   Patient cannot tolerate more than 2 doses   Sulfa Antibiotics Rash        Medication List     STOP taking these medications    albuterol 108 (90 Base) MCG/ACT inhaler Commonly known as: VENTOLIN HFA   Aspirin Low Dose 81 MG tablet Generic drug: aspirin EC   polyethylene glycol powder  17 GM/SCOOP powder Commonly known as: GLYCOLAX/MIRALAX       TAKE these medications    acetaminophen 325 MG tablet Commonly known as: TYLENOL Take 2 tablets (650 mg total) by mouth 3 (three) times daily. Wean as able   Calcium Antacid 500 MG chewable tablet Generic drug: calcium carbonate Chew 2 tablets (400 mg of elemental calcium total) by mouth daily.   clopidogrel 75 MG tablet Commonly known as: PLAVIX Take 1 tablet (75 mg total) by mouth daily. Start taking on: October 01, 2022   diclofenac Sodium 1 % Gel Commonly known as: VOLTAREN Apply 2 g topically 4 (four) times daily.   ferrous sulfate 325 (65 FE) MG tablet Take 1 tablet (325 mg total) by mouth daily with breakfast.   furosemide 20 MG tablet Commonly known as: LASIX Take 1 tablet (20 mg total) by mouth as needed. Leg Swelling M79.89   ipratropium 17 MCG/ACT inhaler Commonly known as: ATROVENT HFA Inhale 2 puffs into the lungs every 4 (four) hours as needed for wheezing.   ipratropium-albuterol 0.5-2.5 (3) MG/3ML Soln Commonly known as: DUONEB Take 3 mLs by nebulization every 4 (  four) hours as needed.   loratadine 10 MG tablet Commonly known as: CLARITIN TAKE 1 TABLET BY MOUTH EVERY DAY   methocarbamol 500 MG tablet Commonly known as: ROBAXIN Take 1 tablet (500 mg total) by mouth every 6 (six) hours as needed for muscle spasms.   metoprolol succinate 25 MG 24 hr tablet Commonly known as: TOPROL-XL Take 0.5 tablets (12.5 mg total) by mouth daily.   multivitamin with minerals Tabs tablet Take 1 tablet by mouth daily.   potassium chloride 10 MEQ tablet Commonly known as: KLOR-CON M Take 2 tablets (20 mEq total) by mouth daily.   Simbrinza 1-0.2 % Susp Generic drug: Brinzolamide-Brimonidine Place 1 drop into both eyes 2 (two) times daily.   sodium bicarbonate 650 MG tablet Take 1 tablet (650 mg total) by mouth 2 (two) times daily.   timolol 0.5 % ophthalmic solution Commonly known as:  TIMOPTIC Place 1 drop into both eyes 2 (two) times daily.   traMADol 50 MG tablet Commonly known as: ULTRAM Take 1 tablet (50 mg total) by mouth every 8 (eight) hours as needed.   Vitamin D (Ergocalciferol) 1.25 MG (50000 UNIT) Caps capsule Commonly known as: DRISDOL Take 1 capsule (50,000 Units total) by mouth every 7 (seven) days.               Discharge Care Instructions  (From admission, onward)           Start     Ordered   09/30/22 0000  Discharge wound care:       Comments: Follow up wound care in [facility]   09/30/22 1101            Follow-up Information     Simmons-Robinson, Makiera, MD Follow up in 1 week(s).   Specialty: Family Medicine Contact information: [address] [place] [address] [phone]                Allergies  Allergen Reactions   Oxycodone Other (See Comments)    Patient cannot tolerate more than 2 doses.   Prednisone Other (See Comments)    Patient cannot tolerate more than 2 doses   Sulfa Antibiotics Rash    Consultations: Neurology   Procedures/Studies: DG Chest Port 1 View  Result Date: 09/25/2022 CLINICAL DATA:  Cough. EXAM: PORTABLE CHEST 1 VIEW COMPARISON:  09/23/2022 FINDINGS: The cardio pericardial silhouette is enlarged. Bibasilar collapse/consolidation is associated with small to moderate bilateral pleural effusions. Prominent skin fold noted over the left lung. Status post cardiac valve replacement and CABG. Telemetry leads overlie the chest. IMPRESSION: Bibasilar collapse/consolidation with small to moderate bilateral pleural effusions. Electronically Signed   By: Misty Stanley M.D.   On: 09/25/2022 08:26   US Carotid Bilateral  Result Date: 09/23/2022 CLINICAL DATA:  Stroke. EXAM: BILATERAL CAROTID DUPLEX ULTRASOUND TECHNIQUE: Pearline Cables scale imaging, color Doppler and duplex ultrasound were performed of bilateral carotid and vertebral arteries in the neck. COMPARISON:  None Available.  FINDINGS: Criteria: Quantification of carotid stenosis is based on velocity parameters that correlate the residual internal carotid diameter with NASCET-based stenosis levels, using the diameter of the distal internal carotid lumen as the denominator for stenosis measurement. The following velocity measurements were obtained: RIGHT ICA: 58/12 cm/sec CCA: [id number] cm/sec SYSTOLIC ICA/CCA RATIO:  1.1 ECA: 82 cm/sec LEFT ICA: 62/17 cm/sec CCA: [id number] cm/sec SYSTOLIC ICA/CCA RATIO:  1.2 ECA: 27 cm/sec RIGHT CAROTID ARTERY: Echogenic plaque at the right carotid bulb. External carotid artery is patent with normal waveform. Normal  waveforms and velocities in the internal carotid artery. RIGHT VERTEBRAL ARTERY: Antegrade flow and normal waveform in the right vertebral artery. LEFT CAROTID ARTERY: Small amount of plaque at the left carotid bulb. External carotid artery is patent with normal waveform. Shadowing plaque in the proximal internal carotid artery. Normal waveforms and velocities in the internal carotid artery. LEFT VERTEBRAL ARTERY: Antegrade flow and normal waveform in the left vertebral artery. IMPRESSION: 1. Atherosclerotic plaque involving bilateral carotid arteries. Estimated degree of stenosis in the internal carotid arteries is less than 50% bilaterally. 2. Patent vertebral arteries with antegrade flow. Electronically Signed   By: Markus Daft M.D.   On: 09/23/2022 15:37   MR ANGIO HEAD WO CONTRAST  Result Date: 09/23/2022 CLINICAL DATA:  Stroke, follow-up. EXAM: MRA HEAD WITHOUT CONTRAST TECHNIQUE: Angiographic images of the Circle of Willis were acquired using MRA technique without intravenous contrast. COMPARISON:  MRI brain 09/21/2022.  CT head 09/21/2022. FINDINGS: Anterior circulation: Intracranial ICAs are patent without stenosis or aneurysm. The proximal ACAs and MCAs are patent without stenosis or aneurysm. Distal branches are symmetric. Posterior circulation: Normal basilar artery. The SCAs, AICAs and  PICAs are patent proximally. The PCAs are patent proximally without stenosis or aneurysm. Distal branches are symmetric. Anatomic variants: Azygous A2 segment of the ACAs. Other: None. IMPRESSION: No large vessel occlusion or hemodynamically significant stenosis in the head. Electronically Signed   By: Emmit Alexanders M.D.   On: 09/23/2022 15:17   DG Chest Port 1 View  Result Date: 09/23/2022 CLINICAL DATA:  Lethargy.  Worsening shortness of breath EXAM: PORTABLE CHEST 1 VIEW COMPARISON:  09/21/2022 FINDINGS: Increased densities in the right perihilar region. Persistent densities at the lung bases probably represent atelectasis and pleural fluid. Postoperative changes involving the heart. Heart size upper limits of normal but stable. Atherosclerotic calcifications at the aortic arch. Negative for a pneumothorax. IMPRESSION: 1. Increased densities in the right perihilar region. Findings could represent asymmetric edema versus infection. 2. Persistent bibasilar chest densities probably represent atelectasis and pleural effusions. Electronically Signed   By: Markus Daft M.D.   On: 09/23/2022 08:51   ECHOCARDIOGRAM COMPLETE  Result Date: 09/22/2022    ECHOCARDIOGRAM REPORT   Patient Name:   NAIYAH JEWETT Date of Exam: 09/22/2022 Medical Rec #:  [id number]        Height:       64.0 in Accession #:    [id number]       Weight:       133.4 lb Date of Birth:  Feb 14, 1926        BSA:          1.647 m Patient Age:    89 years         BP:           147/67 mmHg Patient Gender: F                HR:           76 bpm. Exam Location:  [facility] Procedure: 2D Echo, Cardiac Doppler and Color Doppler Indications:     CHF  History:         Patient has prior history of Echocardiogram examinations, most                  recent 05/28/2013. CHF, CAD, Prior CABG, COPD,                  Signs/Symptoms:Murmur; Risk Factors:Hypertension, Diabetes and  Dyslipidemia. Bilateral plerual effusion, COVID +.  Sonographer:     Wenda Low Referring Phys:  [id number] Athena Masse Diagnosing Phys: Ida Rogue MD IMPRESSIONS  1. Left ventricular ejection fraction, by estimation, is 45 to 50%. The left ventricle has mildly decreased function. The left ventricle demonstrates global hypokinesis. There is mild left ventricular hypertrophy. Left ventricular diastolic parameters are consistent with Grade II diastolic dysfunction (pseudonormalization). There is the interventricular septum is flattened in systole, consistent with right ventricular pressure overload.  2. Right ventricular systolic function is severely reduced. The right ventricular size is normal. Mildly increased right ventricular wall thickness. There is moderately elevated pulmonary artery systolic pressure. The estimated right ventricular systolic pressure is 123XX123 mmHg.  3. Left atrial size was mildly dilated.  4. Moderate pleural effusion in the left lateral region.  5. The mitral valve is normal in structure. Moderate mitral valve regurgitation. No evidence of mitral stenosis. Moderate mitral annular calcification.  6. Tricuspid valve regurgitation is moderate to severe.  7. The aortic valve is tricuspid. Aortic valve regurgitation is mild. Aortic valve sclerosis is present, with no evidence of aortic valve stenosis.  8. The inferior vena cava is dilated in size with >50% respiratory variability, suggesting right atrial pressure of 8 mmHg. FINDINGS  Left Ventricle: Left ventricular ejection fraction, by estimation, is 45 to 50%. The left ventricle has mildly decreased function. The left ventricle demonstrates global hypokinesis. The left ventricular internal cavity size was normal in size. There is  mild left ventricular hypertrophy. The interventricular septum is flattened in systole, consistent with right ventricular pressure overload. Left ventricular diastolic parameters are consistent with Grade II diastolic dysfunction (pseudonormalization). Right Ventricle: The right  ventricular size is normal. Mildly increased right ventricular wall thickness. Right ventricular systolic function is severely reduced. There is moderately elevated pulmonary artery systolic pressure. The tricuspid regurgitant velocity is 3.08 m/s, and with an assumed right atrial pressure of 8 mmHg, the estimated right ventricular systolic pressure is 123XX123 mmHg. Left Atrium: Left atrial size was mildly dilated. Right Atrium: Right atrial size was normal in size. Pericardium: There is no evidence of pericardial effusion. Mitral Valve: The mitral valve is normal in structure. There is mild thickening of the mitral valve leaflet(s). Moderate mitral annular calcification. Moderate mitral valve regurgitation. No evidence of mitral valve stenosis. MV peak gradient, 10.4 mmHg.  The mean mitral valve gradient is 5.0 mmHg. Tricuspid Valve: The tricuspid valve is normal in structure. Tricuspid valve regurgitation is moderate to severe. No evidence of tricuspid stenosis. Aortic Valve: The aortic valve is tricuspid. Aortic valve regurgitation is mild. Aortic valve sclerosis is present, with no evidence of aortic valve stenosis. Aortic valve mean gradient measures 4.0 mmHg. Aortic valve peak gradient measures 7.0 mmHg. Aortic valve area, by VTI measures 2.03 cm. Pulmonic Valve: The pulmonic valve was normal in structure. Pulmonic valve regurgitation is mild. No evidence of pulmonic stenosis. Aorta: The aortic root is normal in size and structure. Venous: The inferior vena cava is dilated in size with greater than 50% respiratory variability, suggesting right atrial pressure of 8 mmHg. IAS/Shunts: No atrial level shunt detected by color flow Doppler. Additional Comments: There is a moderate pleural effusion in the left lateral region.  LEFT VENTRICLE PLAX 2D LVIDd:         4.30 cm   Diastology LVIDs:         3.50 cm   LV e' medial:    9.90 cm/s LV PW:  0.80 cm   LV E/e' medial:  14.2 LV IVS:        1.10 cm   LV e'  lateral:   6.53 cm/s LVOT diam:     2.00 cm   LV E/e' lateral: 21.6 LV SV:         58 LV SV Index:   35 LVOT Area:     3.14 cm  RIGHT VENTRICLE RV Basal diam:  4.20 cm RV Mid diam:    3.60 cm LEFT ATRIUM             Index        RIGHT ATRIUM           Index LA diam:        4.30 cm 2.61 cm/m   RA Area:     18.60 cm LA Vol (A2C):   76.0 ml 46.14 ml/m  RA Volume:   55.00 ml  33.39 ml/m LA Vol (A4C):   48.2 ml 29.26 ml/m LA Biplane Vol: 61.4 ml 37.28 ml/m  AORTIC VALVE                    PULMONIC VALVE AV Area (Vmax):    2.07 cm     PV Vmax:       0.83 m/s AV Area (Vmean):   1.79 cm     PV Peak grad:  2.8 mmHg AV Area (VTI):     2.03 cm AV Vmax:           132.00 cm/s AV Vmean:          90.400 cm/s AV VTI:            0.285 m AV Peak Grad:      7.0 mmHg AV Mean Grad:      4.0 mmHg LVOT Vmax:         87.00 cm/s LVOT Vmean:        51.400 cm/s LVOT VTI:          0.184 m LVOT/AV VTI ratio: 0.65  AORTA Ao Root diam: 2.90 cm Ao Asc diam:  2.70 cm MITRAL VALVE                TRICUSPID VALVE MV Area (PHT): 2.66 cm     TR Peak grad:   37.9 mmHg MV Area VTI:   1.35 cm     TR Vmax:        308.00 cm/s MV Peak grad:  10.4 mmHg MV Mean grad:  5.0 mmHg     SHUNTS MV Vmax:       1.62 m/s     Systemic VTI:  0.18 m MV Vmean:      102.0 cm/s   Systemic Diam: 2.00 cm MV Decel Time: 285 msec MV E velocity: 141.00 cm/s MV A velocity: 98.00 cm/s MV E/A ratio:  1.44 Ida Rogue MD Electronically signed by Ida Rogue MD Signature Date/Time: 09/22/2022/5:21:19 PM    Final    MR BRAIN WO CONTRAST  Result Date: 09/21/2022 CLINICAL DATA:  Initial evaluation for neuro deficit, stroke suspected. EXAM: MRI HEAD WITHOUT CONTRAST TECHNIQUE: Multiplanar, multiecho pulse sequences of the brain and surrounding structures were obtained without intravenous contrast. COMPARISON:  CT from earlier the same day. FINDINGS: Brain: Generalized age-related cerebral atrophy. Patchy and confluent T2/FLAIR hyperintensity involving the periventricular  white matter, most characteristic of chronic microvascular ischemic disease, mild-to-moderate in nature. 7 mm focus of restricted diffusion involving the right cerebellum, consistent with  a small acute ischemic nonhemorrhagic infarct (series 5, image 13). No associated mass effect. No other evidence for acute or subacute ischemia. Gray-white matter differentiation otherwise maintained. No acute intracranial hemorrhage. Few punctate chronic micro hemorrhages noted within the right cerebral hemisphere, likely small vessel related. No mass lesion, midline shift or mass effect no hydrocephalus or extra-axial fluid collection. Pituitary gland and suprasellar region within normal limits. Vascular: Major intracranial vascular flow voids are maintained. Skull and upper cervical spine: Craniocervical junction within normal limits. Bone marrow signal intensity grossly within normal limits. No scalp soft tissue abnormality. Sinuses/Orbits: Prior bilateral ocular lens replacement. Chronic mucosal thickening present about the ethmoidal air cells and maxillary sinuses. No mastoid effusion. Other: None. IMPRESSION: 1. 7 mm acute ischemic nonhemorrhagic right cerebellar infarct. 2. Underlying age-related cerebral atrophy with mild to moderate chronic microvascular ischemic disease. Electronically Signed   By: Jeannine Boga M.D.   On: 09/21/2022 23:32   CT HEAD WO CONTRAST  Result Date: 09/21/2022 CLINICAL DATA:  Neuro deficit, acute, stroke suspected. Slurred speech. Weakness. EXAM: CT HEAD WITHOUT CONTRAST TECHNIQUE: Contiguous axial images were obtained from the base of the skull through the vertex without intravenous contrast. RADIATION DOSE REDUCTION: This exam was performed according to the departmental dose-optimization program which includes automated exposure control, adjustment of the mA and/or kV according to patient size and/or use of iterative reconstruction technique. COMPARISON:  02/10/2021 FINDINGS: Brain:  Age related atrophy. Chronic small-vessel ischemic changes of the hemispheric white matter. No sign of acute infarction, mass lesion, hemorrhage, hydrocephalus or extra-axial collection. Vascular: There is atherosclerotic calcification of the major vessels at the base of the brain. Skull: Negative Sinuses/Orbits: Clear/normal Other: None IMPRESSION: No acute CT finding. Age related atrophy and chronic small-vessel ischemic changes of the white matter. Electronically Signed   By: Nelson Chimes M.D.   On: 09/21/2022 19:57   DG Chest 2 View  Result Date: 09/21/2022 CLINICAL DATA:  Cough. Weakness. Altered mental status. History of diabetes, hypertension, coronary bypass. EXAM: CHEST - 2 VIEW COMPARISON:  06/26/2022 FINDINGS: Postoperative changes in the mediastinum. Mild cardiac enlargement. No vascular congestion. Moderate bilateral pleural effusions with basilar atelectasis. No pneumothorax. Mediastinal contours appear intact. Calcification of the aorta. Degenerative changes in the spine and shoulders. IMPRESSION: Bilateral pleural effusions with basilar atelectasis. Electronically Signed   By: Lucienne Capers M.D.   On: 09/21/2022 19:56   (Echo, Carotid, EGD, Colonoscopy, ERCP)    Subjective:   Discharge Exam: Vitals:   09/30/22 0337 09/30/22 0751  BP: (!) 145/74 (!) 149/62  Pulse: 68 73  Resp: 20 16  Temp: 97.6 F (36.4 C) 98 F (36.7 C)  SpO2: 99% 98%   Vitals:   09/29/22 1949 09/29/22 2339 09/30/22 0337 09/30/22 0751  BP: 133/62 (!) 143/63 (!) 145/74 (!) 149/62  Pulse: 71 70 68 73  Resp: 16 18 20 16  $ Temp: 98.2 F (36.8 C) 98.2 F (36.8 C) 97.6 F (36.4 C) 98 F (36.7 C)  TempSrc: Oral  Oral   SpO2: 100% 100% 99% 98%  Weight:        General: Pt is alert, awake, not in acute distress Cardiovascular: RRR, S1/S2 +, no rubs, no gallops Respiratory: CTA bilaterally, no wheezing, no rhonchi Abdominal: Soft, NT, ND, bowel sounds + Extremities: no edema, no cyanosis    The  results of significant diagnostics from this hospitalization (including imaging, microbiology, ancillary and laboratory) are listed below for reference.     Microbiology: Recent Results (from the past  240 hour(s))  Resp panel by RT-PCR (RSV, Flu A&B, Covid) Anterior Nasal Swab     Status: Abnormal   Collection Time: 09/21/22  8:33 PM   Specimen: Anterior Nasal Swab  Result Value Ref Range Status   SARS Coronavirus 2 by RT PCR POSITIVE (A) NEGATIVE Final    Comment: (NOTE) SARS-CoV-2 target nucleic acids are DETECTED.  The SARS-CoV-2 RNA is generally detectable in upper respiratory specimens during the acute phase of infection. Positive results are indicative of the presence of the identified virus, but do not rule out bacterial infection or co-infection with other pathogens not detected by the test. Clinical correlation with patient history and other diagnostic information is necessary to determine patient infection status. The expected result is Negative.  Fact Sheet for Patients: EntrepreneurPulse.com.au  Fact Sheet for Healthcare Providers: IncredibleEmployment.be  This test is not yet approved or cleared by the [place] FDA and  has been authorized for detection and/or diagnosis of SARS-CoV-2 by FDA under an Emergency Use Authorization (EUA).  This EUA will remain in effect (meaning this test can be used) for the duration of  the COVID-19 declaration under Section 564(b)(1) of the A ct, 21 U.S.C. section 360bbb-3(b)(1), unless the authorization is terminated or revoked sooner.     Influenza A by PCR NEGATIVE NEGATIVE Final   Influenza B by PCR NEGATIVE NEGATIVE Final    Comment: (NOTE) The Xpert Xpress SARS-CoV-2/FLU/RSV plus assay is intended as an aid in the diagnosis of influenza from Nasopharyngeal swab specimens and should not be used as a sole basis for treatment. Nasal washings and aspirates are unacceptable for Xpert Xpress  SARS-CoV-2/FLU/RSV testing.  Fact Sheet for Patients: EntrepreneurPulse.com.au  Fact Sheet for Healthcare Providers: IncredibleEmployment.be  This test is not yet approved or cleared by the [place] FDA and has been authorized for detection and/or diagnosis of SARS-CoV-2 by FDA under an Emergency Use Authorization (EUA). This EUA will remain in effect (meaning this test can be used) for the duration of the COVID-19 declaration under Section 564(b)(1) of the Act, 21 U.S.C. section 360bbb-3(b)(1), unless the authorization is terminated or revoked.     Resp Syncytial Virus by PCR NEGATIVE NEGATIVE Final    Comment: (NOTE) Fact Sheet for Patients: EntrepreneurPulse.com.au  Fact Sheet for Healthcare Providers: IncredibleEmployment.be  This test is not yet approved or cleared by the [place] FDA and has been authorized for detection and/or diagnosis of SARS-CoV-2 by FDA under an Emergency Use Authorization (EUA). This EUA will remain in effect (meaning this test can be used) for the duration of the COVID-19 declaration under Section 564(b)(1) of the Act, 21 U.S.C. section 360bbb-3(b)(1), unless the authorization is terminated or revoked.  Performed at [facility], [address] [place], [place] [address]      Labs: BNP (last 3 results) Recent Labs    09/21/22 2033  BNP 123456*   Basic Metabolic Panel: Recent Labs  Lab 09/24/22 0529 09/25/22 0640 09/26/22 0421 09/27/22 0546 09/28/22 0731  NA 139 145 143 147* 145  K 4.0 3.5 4.0 4.7 4.4  CL 106 115* 113* 112* 113*  CO2 25 25 21* 26 25  GLUCOSE 156* 98 199* 127* 160*  BUN 35* 31* 33* 34* 37*  CREATININE 1.45* 1.17* 1.19* 1.14* 1.11*  CALCIUM 8.1* 7.9* 7.5* 8.1* 8.0*  MG  --   --   --  1.9 2.0  PHOS  --   --   --  2.8 3.1   Liver Function Tests:  No results for input(s): "AST", "ALT", "ALKPHOS", "BILITOT", "PROT",  "ALBUMIN" in the last 168 hours. No results for input(s): "LIPASE", "AMYLASE" in the last 168 hours. No results for input(s): "AMMONIA" in the last 168 hours. CBC: Recent Labs  Lab 09/24/22 0529 09/26/22 0632 09/27/22 0546 09/28/22 0731  WBC 7.6 11.4* 14.3* 10.0  HGB 10.2* 9.2* 8.4* 9.0*  HCT 33.4* 29.8* 27.4* 29.5*  MCV 81.9 79.5* 79.7* 79.9*  PLT 41* 37* 39* 39*   Cardiac Enzymes: No results for input(s): "CKTOTAL", "CKMB", "CKMBINDEX", "TROPONINI" in the last 168 hours. BNP: Invalid input(s): "POCBNP" CBG: Recent Labs  Lab 09/23/22 1656 09/23/22 2127 09/24/22 2125  GLUCAP 140* 231* 183*   D-Dimer No results for input(s): "DDIMER" in the last 72 hours. Hgb A1c No results for input(s): "HGBA1C" in the last 72 hours. Lipid Profile No results for input(s): "CHOL", "HDL", "LDLCALC", "TRIG", "CHOLHDL", "LDLDIRECT" in the last 72 hours. Thyroid function studies No results for input(s): "TSH", "T4TOTAL", "T3FREE", "THYROIDAB" in the last 72 hours.  Invalid input(s): "FREET3" Anemia work up No results for input(s): "VITAMINB12", "FOLATE", "FERRITIN", "TIBC", "IRON", "RETICCTPCT" in the last 72 hours. Urinalysis    Component Value Date/Time   COLORURINE YELLOW (A) 04/16/2022 2247   APPEARANCEUR CLOUDY (A) 04/16/2022 2247   LABSPEC 1.015 04/16/2022 2247   PHURINE 5.5 04/16/2022 2247   GLUCOSEU NEGATIVE 04/16/2022 2247   HGBUR TRACE (A) 04/16/2022 2247   BILIRUBINUR NEGATIVE 04/16/2022 2247   KETONESUR NEGATIVE 04/16/2022 2247   PROTEINUR NEGATIVE 04/16/2022 2247   UROBILINOGEN 0.2 05/28/2013 1328   NITRITE NEGATIVE 04/16/2022 2247   LEUKOCYTESUR NEGATIVE 04/16/2022 2247   Sepsis Labs Recent Labs  Lab 09/24/22 0529 09/26/22 0632 09/27/22 0546 09/28/22 0731  WBC 7.6 11.4* 14.3* 10.0   Microbiology Recent Results (from the past 240 hour(s))  Resp panel by RT-PCR (RSV, Flu A&B, Covid) Anterior Nasal Swab     Status: Abnormal   Collection Time: 09/21/22  8:33 PM    Specimen: Anterior Nasal Swab  Result Value Ref Range Status   SARS Coronavirus 2 by RT PCR POSITIVE (A) NEGATIVE Final    Comment: (NOTE) SARS-CoV-2 target nucleic acids are DETECTED.  The SARS-CoV-2 RNA is generally detectable in upper respiratory specimens during the acute phase of infection. Positive results are indicative of the presence of the identified virus, but do not rule out bacterial infection or co-infection with other pathogens not detected by the test. Clinical correlation with patient history and other diagnostic information is necessary to determine patient infection status. The expected result is Negative.  Fact Sheet for Patients: EntrepreneurPulse.com.au  Fact Sheet for Healthcare Providers: IncredibleEmployment.be  This test is not yet approved or cleared by the [place] FDA and  has been authorized for detection and/or diagnosis of SARS-CoV-2 by FDA under an Emergency Use Authorization (EUA).  This EUA will remain in effect (meaning this test can be used) for the duration of  the COVID-19 declaration under Section 564(b)(1) of the A ct, 21 U.S.C. section 360bbb-3(b)(1), unless the authorization is terminated or revoked sooner.     Influenza A by PCR NEGATIVE NEGATIVE Final   Influenza B by PCR NEGATIVE NEGATIVE Final    Comment: (NOTE) The Xpert Xpress SARS-CoV-2/FLU/RSV plus assay is intended as an aid in the diagnosis of influenza from Nasopharyngeal swab specimens and should not be used as a sole basis for treatment. Nasal washings and aspirates are unacceptable for Xpert Xpress SARS-CoV-2/FLU/RSV testing.  Fact Sheet for Patients: EntrepreneurPulse.com.au  Fact  Sheet for Healthcare Providers: IncredibleEmployment.be  This test is not yet approved or cleared by the [place] and has been authorized for detection and/or diagnosis of SARS-CoV-2 by FDA under an  Emergency Use Authorization (EUA). This EUA will remain in effect (meaning this test can be used) for the duration of the COVID-19 declaration under Section 564(b)(1) of the Act, 21 U.S.C. section 360bbb-3(b)(1), unless the authorization is terminated or revoked.     Resp Syncytial Virus by PCR NEGATIVE NEGATIVE Final    Comment: (NOTE) Fact Sheet for Patients: EntrepreneurPulse.com.au  Fact Sheet for Healthcare Providers: IncredibleEmployment.be  This test is not yet approved or cleared by the [place] FDA and has been authorized for detection and/or diagnosis of SARS-CoV-2 by FDA under an Emergency Use Authorization (EUA). This EUA will remain in effect (meaning this test can be used) for the duration of the COVID-19 declaration under Section 564(b)(1) of the Act, 21 U.S.C. section 360bbb-3(b)(1), unless the authorization is terminated or revoked.  Performed at [facility], [address], [place], [place] [address]      Time coordinating discharge: Over 30 minutes  SIGNED:   Shawna Clamp, MD  [facility] 09/30/2022, 11:02 AM Pager   If 7PM-7AM, please contact night-coverage

## 2022-09-30 NOTE — Progress Notes (Signed)
  [facility]  ROUNDING NOTE   Subjective:   Gabriela [name] is a 89 y.o. female with past medical history including hypertension, CAD, diabetes, MGUS, and chronic kidney disease stage 3a. Patient presents to ED with weakness, slurred speech and facial droop. She was admitted for Slurred speech [R47.81] Pleural effusion [J90] Acute CHF (congestive heart failure) ([place]) [I50.9] AKI (acute kidney injury) ([place]) [N17.9] COVID-19 [U07.1]  Patient is known to our practice and is followed by Dr Candiss Norse. She was last seen in the office on September 15, 2022.   Creatinine remains stable   Assessment/ Plan:  Ms. Gabriela [name] is a 89 y.o.  female with past medical history including hypertension, CAD, diabetes, MGUS, and chronic kidney disease stage 3a. Patient presents to ED with weakness, slurred speech and facial droop. She was admitted for Slurred speech [R47.81] Pleural effusion [J90] Acute CHF (congestive heart failure) ([facility]) [I50.9] AKI (acute kidney injury) ([place]) [N17.9] COVID-19 [U07.1]   Chronic kidney disease stage IIIa with baseline creatinine 1.48 and GFR of 33 on 09/15/22.  Creatinine remains stable, improved beyond baseline.   Lab Results  Component Value Date   CREATININE 1.11 (H) 09/28/2022   CREATININE 1.14 (H) 09/27/2022   CREATININE 1.19 (H) 09/26/2022    Intake/Output Summary (Last 24 hours) at 09/30/2022 1203 Last data filed at 09/30/2022 0400 Gross per 24 hour  Intake 320 ml  Output 450 ml  Net -130 ml    2. Anemia of chronic kidney disease Lab Results  Component Value Date   HGB 9.0 (L) 09/28/2022    Hgb acceptable  3. Hypertension with chronic kidney disease. Home regimen includes torsemide and metoprolol. Continue furosemide and metoprolol.   4. Diabetes mellitus type II with chronic kidney disease/renal manifestations: noninsulin dependent. Most recent hemoglobin A1c is 6.8 on 07/01/22.   5. Acute ischemic stroke, right cerebellar. Neurology  following. Plavix ordered.   Due to stable presence of renal function, we will sign off. Feel free to reconsult if needed    LOS: 9 [place] 2/16/202412:03 PM

## 2022-09-30 NOTE — Plan of Care (Addendum)
Patient assessments have remained stable throughout the shift.  All interactions have been consistent.  A/O 1-2, follows simple instructions. Polite and appreciative. Occassionally Pressing call light if needs something.  Still 0 Family (Granddaughters), continue to question if patient is ready for Discharge to [facility].  They are concerned that om is still on oxygen and also about patient's stubborn cough.  (PRN Robitussin given.)  SPO2 tested again at rest - 3LNC 92%.  Unable to get ambulation trial since patient is currently 2:1 assist PIVOT to move from chair to bed.  However - RA at rest SPO2 was 84% and dropping.    Repeat CXR to also be obtained per request. Attempted to educate 3x this shift, explaining that a resp infection and especially Covid often requires a patients to stay on O2 till through the healing process.  Yet, hopefully it will be temporary.

## 2022-10-01 DIAGNOSIS — J9601 Acute respiratory failure with hypoxia: Secondary | ICD-10-CM | POA: Diagnosis not present

## 2022-10-01 NOTE — Progress Notes (Signed)
Physical Therapy Treatment Patient Details Name: Gabriela [name] MRN: [id number] DOB: [birth year] Today's Date: 10/01/2022   History of Present Illness Pt is a 89 y.o. female with medical history significant for HTN, CAD, CKD 3a, DM, MGUS who presents to the ED by EMS with concerns for 3-day history of weakness, confusion, slurred speech and dropping things. MD assessment includes: Acute hypoxic respiratory failure with COVID-19, acute stroke due to ischemia, AKI, and generalized weakness.    PT Comments    Pt pleasant and willing to do some limited activity but did not wish to try transfer to recliner/slide board training.  Nor did she wish to have LEs elevated (bed or recliner) and wanted to stay sitting EOB and watch TV.  She showed good effort with limited LE exericses and scooting EOB.  B/L LEs with some edema, limited AROM (and available PROM).  Pt continues to need considerable assist with bed mobility/scooting tasks.     Recommendations for follow up therapy are one component of a multi-disciplinary discharge planning process, led by the attending physician.  Recommendations may be updated based on patient status, additional functional criteria and insurance authorization.  Follow Up Recommendations  Skilled nursing-short term rehab (<3 hours/day) Can patient physically be transported by private vehicle: No   Assistance Recommended at Discharge Frequent or constant Supervision/Assistance  Patient can return home with the following Assist for transportation;Direct supervision/assist for medications management;Assistance with cooking/housework;Two people to help with walking and/or transfers;A lot of help with bathing/dressing/bathroom   Equipment Recommendations  None recommended by PT    Recommendations for Other Services       Precautions / Restrictions Precautions Precautions: Fall     Mobility  Bed Mobility               General bed mobility comments: Pt seated EOB  on arrival, wishes to remain seated despite cuing/education for LE elevation due to edema    Transfers Overall transfer level: Needs assistance                Lateral/Scoot Transfers: Max assist General transfer comment: Pt showed good effort but was able to give only minimal assist with using UEs to side scoot at EOB, needed heavy assist to move/shift LEs to prep for each scoot and mod/max assist to actually move hips along EOB    Ambulation/Gait                   Stairs             Wheelchair Mobility    Modified Rankin (Stroke Patients Only)       Balance Overall balance assessment: Needs assistance Sitting-balance support: Feet supported Sitting balance-Leahy Scale: Good       Standing balance-Leahy Scale: Zero (unable to attempt)                              Cognition Arousal/Alertness: Awake/alert Behavior During Therapy: WFL for tasks assessed/performed Overall Cognitive Status: Within Functional Limits for tasks assessed                                 General Comments: Pleasant and cooperative        Exercises General Exercises - Lower Extremity Ankle Circles/Pumps: 10 reps, AAROM Long Arc Quad: 10 reps, AAROM, AROM (AAROM on R, AROM on L, lacks TKE b/l, R more limited  than L) Hip ABduction/ADduction: AAROM, 10 reps Hip Flexion/Marching: AAROM, 10 reps    General Comments General comments (skin integrity, edema, etc.): Pt pleasant and willing to do light activity but despite much encouragement did not wish to get to chair/use slide board      Pertinent Vitals/Pain Pain Assessment Pain Assessment:  (pt just c/o being cold, assisted with blankets)    Home Living                          Prior Function            PT Goals (current goals can now be found in the care plan section) Progress towards PT goals: Progressing toward goals    Frequency    Min 2X/week      PT Plan Current  plan remains appropriate    Co-evaluation              AM-PAC PT "6 Clicks" Mobility   Outcome Measure  Help needed turning from your back to your side while in a flat bed without using bedrails?: A Little Help needed moving from lying on your back to sitting on the side of a flat bed without using bedrails?: A Little Help needed moving to and from a bed to a chair (including a wheelchair)?: Total Help needed standing up from a chair using your arms (e.g., wheelchair or bedside chair)?: Total Help needed to walk in hospital room?: Total Help needed climbing 3-5 steps with a railing? : Total 6 Click Score: 10    End of Session Equipment Utilized During Treatment: Gait belt Activity Tolerance: Patient tolerated treatment well Patient left: in chair;with call bell/phone within reach Nurse Communication: Mobility status PT Visit Diagnosis: Other abnormalities of gait and mobility (R26.89);Muscle weakness (generalized) (M62.81)     Time: [id number] PT Time Calculation (min) (ACUTE ONLY): 27 min  Charges:  $Therapeutic Exercise: 8-22 mins $Therapeutic Activity: 8-22 mins                     Kreg Shropshire, DPT 10/01/2022, 4:24 PM

## 2022-10-01 NOTE — Progress Notes (Signed)
PROGRESS NOTE    Gabriela [name]  [id number] DOB: [birth year] DOA: 09/21/2022  PCP: [name], MD   Brief Narrative:  This 89 y.o. female with PMH significant for HTN, CAD, CKD 3a, DM, MGUS who presents to the ED by EMS with  3-day history of weakness, confusion, slurred speech and dropping things.  Most of the history is given by the granddaughter at the bedside who stated that she noticed that her grandmother has increasingly worsening swelling of the bilateral lower extremities up to the thighs in spite of taking her lasix 10 mg daily.  She has no prior history of CHF and states she was prescribed Lasix by her PCP.  She took her to the nephrologist on 09/15/22 who changed  Lasix to torsemide and ordered compression socks.  She stated that since the medication was changed, her grandmother continue to have swelling and seem to be weaker and she also has developed a cough that has become increasingly more congested.  She is not short of breath,  at baseline she does not move around a lot.  ED course and data review: Afebrile with unremarkable vitals. Labs significant for troponin of 60 and BNP 4115.  WBC 11,500 with hemoglobin 8.8, platelets 51,000. Creatinine 1.57 above baseline of 0.95 a month ago, respiratory viral panel positive for COVID. Chest x-ray showing bilateral pleural effusions with basilar atelectasis. MRI ordered to evaluate for stroke.  Hospitalist consulted for admission. MRI showed a 7 mm acute ischemic nonhemorrhagic right cerebral infarct.   Assessment & Plan:   Principal Problem:   Acute hypoxic respiratory failure (HCC) Active Problems:   Acute stroke due to ischemia (HCC)   Chronic systolic CHF (congestive heart failure) (HCC)   COVID-19   Acute renal failure superimposed on stage 3a chronic kidney disease (HCC)   Bilateral pleural effusion   Acute metabolic encephalopathy   Generalized weakness   Essential (primary) hypertension   Arthritis,  degenerative   MGUS (monoclonal gammopathy of unknown significance)   Type 2 diabetes mellitus with circulatory disorder (HCC)   Myocardial injury  Acute hypoxic respiratory failure: Improved.  SpO2  100% on 3 L. Continue Oxygen supplementation.   Likely secondary to COVID-19.  Continue prednisone and Lasix. Continue nebulized bronchodilators.   Acute stroke due to ischemia: Right cerebellar area.  I do not think she needs dual antiplatelet agent at this time given low platelet count. Currently on Plavix. As per the Neurology benefits of statins are outweighed by the risks of liver damage and myopathy.   Chronic systolic CHF: Last LVEF [id number].  Continue Lasix 20 mg daily. Appears euvolemic.   COVID-19: Continue supportive care. Continue airborne precautions.   Bilateral pleural effusion: Continue to monitor, shortness of breath has improved.   AKI on CKD stage IIIa: Creatinine 1.57 on arrival, above baseline of 0.95 a month prior.   Creatinine 1.11 today.  Resumed Lasix and watch creatinine.   Acute metabolic encephalopathy: Resolved.  Generalized weakness: At baseline, patient is currently mostly sedentary following a bilateral femoral fracture in November 2023 while working with PT. PT recommended SNF. Patient lives alone and has 24-hour family support all the time. As per patient's granddaughter, they will take her home if she does better with board transfers.  Otherwise they will consider rehab. TOC notified and  family agreed for SNF placement.   Elevated troponin: Troponin slightly elevated but we can get this with a stroke also.   Type 2 diabetes mellitus: With her hands and feet  being cold I do not think the fingersticks are accurate at this time.   Hb A1c only 6.8.  Will continue to watch sugars with daily chemistry.   MGUS (monoclonal gammopathy of unknown significance) Anemia and thrombocytopenia: Last hemoglobin 10.2 and last platelet count 41. Continue  to  monitor.   Osteoarthritis: Continue Tylenol and Robaxin with tramadol as needed.   Essential hypertension: Continue metoprolol.   DVT prophylaxis:  SCDs Code Status: DNR Family Communication: Spoke with daughter on phone. Disposition Plan:    Status is: Inpatient Remains inpatient appropriate because: Admitted for acute hypoxic respiratory failure in the setting of COVID-19 infection.  PT and OT recommended SNF but family wants to take her home with home health services.  Patient has 24-hour care.  Patient needs to transfer from bed to chair before family can take her home. TOC notified.  Looking for SNF place.  Medically clear,  awaiting SNF authorization  Consultants:  None.  Procedures: None.  Antimicrobials:  Anti-infectives (From admission, onward)    Start     Dose/Rate Route Frequency Ordered Stop   09/23/22 1000  remdesivir 100 mg in sodium chloride 0.9 % 100 mL IVPB  Status:  Discontinued       See Hyperspace for full Linked Orders Report.   100 mg 200 mL/hr over 30 Minutes Intravenous Daily 09/22/22 0000 09/22/22 0840   09/22/22 0430  remdesivir 100 mg in sodium chloride 0.9 % 100 mL IVPB       See Hyperspace for full Linked Orders Report.   100 mg 200 mL/hr over 30 Minutes Intravenous  Once 09/22/22 0000 09/23/22 0700   09/22/22 0400  remdesivir 100 mg in sodium chloride 0.9 % 100 mL IVPB       See Hyperspace for full Linked Orders Report.   100 mg 200 mL/hr over 30 Minutes Intravenous  Once 09/22/22 0000 09/22/22 0456      Subjective: Patient was seen and examined at bedside. Overnight events noted.   Patient reports feeling weak but appears much comfortable. She was sitting on the chair, remains on 3 L of supplemental oxygen.  Objective: Vitals:   10/01/22 0058 10/01/22 0445 10/01/22 0958 10/01/22 1200  BP: (!) 154/69 (!) 151/84 138/60 (!) 158/63  Pulse: 73 73 74 81  Resp: 20 20  19  $ Temp: (!) 97.5 F (36.4 C) 98 F (36.7 C)  97.7 F (36.5 C)   TempSrc: Oral Oral  Axillary  SpO2: 95% 100% 96% 92%  Weight:   57.7 kg     Intake/Output Summary (Last 24 hours) at 10/01/2022 1401 Last data filed at 10/01/2022 0500 Gross per 24 hour  Intake 570 ml  Output 450 ml  Net 120 ml   Filed Weights   09/26/22 0903 09/29/22 1553 10/01/22 0958  Weight: 60.8 kg 56.4 kg 57.7 kg    Examination:  General exam: Appears comfortable, not in any acute distress.  Deconditioned. Respiratory system: CTA bilaterally, Respiratory effort normal.  RR 16. Cardiovascular system: S1 & S2 heard, regular rate and rhythm, no murmur. Gastrointestinal system: Abdomen is soft, non tender, non distended, BS+ Central nervous system: Alert and oriented x 2, no focal neurological deficits. Extremities: No edema, no cyanosis, no clubbing Skin: No rashes, lesions or ulcers Psychiatry:  Mood & affect appropriate.     Data Reviewed: I have personally reviewed following labs and imaging studies  CBC: Recent Labs  Lab 09/26/22 0632 09/27/22 0546 09/28/22 0731  WBC 11.4* 14.3* 10.0  HGB  9.2* 8.4* 9.0*  HCT 29.8* 27.4* 29.5*  MCV 79.5* 79.7* 79.9*  PLT 37* 39* 39*   Basic Metabolic Panel: Recent Labs  Lab 09/25/22 0640 09/26/22 0421 09/27/22 0546 09/28/22 0731  NA 145 143 147* 145  K 3.5 4.0 4.7 4.4  CL 115* 113* 112* 113*  CO2 25 21* 26 25  GLUCOSE 98 199* 127* 160*  BUN 31* 33* 34* 37*  CREATININE 1.17* 1.19* 1.14* 1.11*  CALCIUM 7.9* 7.5* 8.1* 8.0*  MG  --   --  1.9 2.0  PHOS  --   --  2.8 3.1   GFR: Estimated Creatinine Clearance: 26.8 mL/min (A) (by C-G formula based on SCr of 1.11 mg/dL (H)). Liver Function Tests: No results for input(s): "AST", "ALT", "ALKPHOS", "BILITOT", "PROT", "ALBUMIN" in the last 168 hours.  No results for input(s): "LIPASE", "AMYLASE" in the last 168 hours. No results for input(s): "AMMONIA" in the last 168 hours. Coagulation Profile: No results for input(s): "INR", "PROTIME" in the last 168 hours. Cardiac  Enzymes: No results for input(s): "CKTOTAL", "CKMB", "CKMBINDEX", "TROPONINI" in the last 168 hours. BNP (last 3 results) No results for input(s): "PROBNP" in the last 8760 hours. HbA1C: No results for input(s): "HGBA1C" in the last 72 hours. CBG: Recent Labs  Lab 09/24/22 2125  GLUCAP 183*   Lipid Profile: No results for input(s): "CHOL", "HDL", "LDLCALC", "TRIG", "CHOLHDL", "LDLDIRECT" in the last 72 hours.  Thyroid Function Tests: No results for input(s): "TSH", "T4TOTAL", "FREET4", "T3FREE", "THYROIDAB" in the last 72 hours. Anemia Panel: No results for input(s): "VITAMINB12", "FOLATE", "FERRITIN", "TIBC", "IRON", "RETICCTPCT" in the last 72 hours. Sepsis Labs: No results for input(s): "PROCALCITON", "LATICACIDVEN" in the last 168 hours.  Recent Results (from the past 240 hour(s))  Resp panel by RT-PCR (RSV, Flu A&B, Covid) Anterior Nasal Swab     Status: Abnormal   Collection Time: 09/21/22  8:33 PM   Specimen: Anterior Nasal Swab  Result Value Ref Range Status   SARS Coronavirus 2 by RT PCR POSITIVE (A) NEGATIVE Final    Comment: (NOTE) SARS-CoV-2 target nucleic acids are DETECTED.  The SARS-CoV-2 RNA is generally detectable in upper respiratory specimens during the acute phase of infection. Positive results are indicative of the presence of the identified virus, but do not rule out bacterial infection or co-infection with other pathogens not detected by the test. Clinical correlation with patient history and other diagnostic information is necessary to determine patient infection status. The expected result is Negative.  Fact Sheet for Patients: EntrepreneurPulse.com.au  Fact Sheet for Healthcare Providers: IncredibleEmployment.be  This test is not yet approved or cleared by the [place] FDA and  has been authorized for detection and/or diagnosis of SARS-CoV-2 by FDA under an Emergency Use Authorization (EUA).  This EUA  will remain in effect (meaning this test can be used) for the duration of  the COVID-19 declaration under Section 564(b)(1) of the A ct, 21 U.S.C. section 360bbb-3(b)(1), unless the authorization is terminated or revoked sooner.     Influenza A by PCR NEGATIVE NEGATIVE Final   Influenza B by PCR NEGATIVE NEGATIVE Final    Comment: (NOTE) The Xpert Xpress SARS-CoV-2/FLU/RSV plus assay is intended as an aid in the diagnosis of influenza from Nasopharyngeal swab specimens and should not be used as a sole basis for treatment. Nasal washings and aspirates are unacceptable for Xpert Xpress SARS-CoV-2/FLU/RSV testing.  Fact Sheet for Patients: EntrepreneurPulse.com.au  Fact Sheet for Healthcare Providers: IncredibleEmployment.be  This test is not  yet approved or cleared by the [place] and has been authorized for detection and/or diagnosis of SARS-CoV-2 by FDA under an Emergency Use Authorization (EUA). This EUA will remain in effect (meaning this test can be used) for the duration of the COVID-19 declaration under Section 564(b)(1) of the Act, 21 U.S.C. section 360bbb-3(b)(1), unless the authorization is terminated or revoked.     Resp Syncytial Virus by PCR NEGATIVE NEGATIVE Final    Comment: (NOTE) Fact Sheet for Patients: EntrepreneurPulse.com.au  Fact Sheet for Healthcare Providers: IncredibleEmployment.be  This test is not yet approved or cleared by the [place] FDA and has been authorized for detection and/or diagnosis of SARS-CoV-2 by FDA under an Emergency Use Authorization (EUA). This EUA will remain in effect (meaning this test can be used) for the duration of the COVID-19 declaration under Section 564(b)(1) of the Act, 21 U.S.C. section 360bbb-3(b)(1), unless the authorization is terminated or revoked.  Performed at [facility], [address], [place], [place]  [address]     Radiology Studies: [facility] Chest [place] 1 View  Result Date: 09/30/2022 CLINICAL DATA:  Cough.  Edema EXAM: PORTABLE CHEST 1 VIEW COMPARISON:  09/25/2022 FINDINGS: Status post median sternotomy. Enlarged heart with calcified aorta. Bilateral small pleural effusions with adjacent opacities. Vascular congestion. No pneumothorax. Overlapping cardiac leads IMPRESSION: Postop chest with enlarged cardiopericardial silhouette. Bilateral small pleural effusions with adjacent opacities and vascular congestion Electronically Signed   By: Jill Side M.D.   On: 09/30/2022 17:42    Scheduled Meds:  brinzolamide  1 drop Both Eyes BID   And   brimonidine  1 drop Both Eyes BID   budesonide  2 puff Inhalation BID   clopidogrel  75 mg Oral Daily   feeding supplement (GLUCERNA SHAKE)  237 mL Oral TID BM   ferrous sulfate  325 mg Oral Q breakfast   furosemide  20 mg Oral Daily   metoprolol succinate  12.5 mg Oral Daily   potassium chloride  20 mEq Oral Daily   sodium chloride flush  10-40 mL Intracatheter Q12H   timolol  1 drop Both Eyes BID   Continuous Infusions:   LOS: 10 days    Time spent: 35 min    Araf Clugston, MD [facility]   If 7PM-7AM, please contact night-coverage

## 2022-10-01 NOTE — Plan of Care (Signed)
  Problem: Education: Goal: Ability to demonstrate management of disease process will improve Outcome: Progressing Goal: Ability to verbalize understanding of medication therapies will improve Outcome: Progressing Goal: Individualized Educational Video(s) Outcome: Progressing   Problem: Activity: Goal: Capacity to carry out activities will improve Outcome: Progressing   Problem: Cardiac: Goal: Ability to achieve and maintain adequate cardiopulmonary perfusion will improve Outcome: Progressing   Problem: Education: Goal: Knowledge of risk factors and measures for prevention of condition will improve Outcome: Progressing   Problem: Coping: Goal: Psychosocial and spiritual needs will be supported Outcome: Progressing   Problem: Respiratory: Goal: Will maintain a patent airway Outcome: Progressing Goal: Complications related to the disease process, condition or treatment will be avoided or minimized Outcome: Progressing   Problem: Education: Goal: Knowledge of General Education information will improve Description: Including pain rating scale, medication(s)/side effects and non-pharmacologic comfort measures Outcome: Progressing   Problem: Health Behavior/Discharge Planning: Goal: Ability to manage health-related needs will improve Outcome: Progressing   Problem: Clinical Measurements: Goal: Ability to maintain clinical measurements within normal limits will improve Outcome: Progressing Goal: Will remain free from infection Outcome: Progressing Goal: Diagnostic test results will improve Outcome: Progressing Goal: Respiratory complications will improve Outcome: Progressing Goal: Cardiovascular complication will be avoided Outcome: Progressing   Problem: Activity: Goal: Risk for activity intolerance will decrease Outcome: Progressing   Problem: Nutrition: Goal: Adequate nutrition will be maintained Outcome: Progressing   Problem: Coping: Goal: Level of anxiety  will decrease Outcome: Progressing   Problem: Elimination: Goal: Will not experience complications related to bowel motility Outcome: Progressing Goal: Will not experience complications related to urinary retention Outcome: Progressing   Problem: Pain Managment: Goal: General experience of comfort will improve Outcome: Progressing   Problem: Safety: Goal: Ability to remain free from injury will improve Outcome: Progressing   Problem: Skin Integrity: Goal: Risk for impaired skin integrity will decrease Outcome: Progressing   Problem: Education: Goal: Knowledge of disease or condition will improve Outcome: Progressing Goal: Knowledge of secondary prevention will improve (MUST DOCUMENT ALL) Outcome: Progressing Goal: Knowledge of patient specific risk factors will improve [place]ta [place] N/A or DELETE if not current risk factor) Outcome: Progressing   Problem: Ischemic Stroke/TIA Tissue Perfusion: Goal: Complications of ischemic stroke/TIA will be minimized Outcome: Progressing   Problem: Coping: Goal: Will verbalize positive feelings about self Outcome: Progressing Goal: Will identify appropriate support needs Outcome: Progressing   Problem: Health Behavior/Discharge Planning: Goal: Ability to manage health-related needs will improve Outcome: Progressing Goal: Goals will be collaboratively established with patient/family Outcome: Progressing   Problem: Self-Care: Goal: Ability to participate in self-care as condition permits will improve Outcome: Progressing Goal: Verbalization of feelings and concerns over difficulty with self-care will improve Outcome: Progressing Goal: Ability to communicate needs accurately will improve Outcome: Progressing   Problem: Nutrition: Goal: Risk of aspiration will decrease Outcome: Progressing Goal: Dietary intake will improve Outcome: Progressing

## 2022-10-01 NOTE — Progress Notes (Signed)
Occupational Therapy Treatment Patient Details Name: Gabriela [name] MRN: [id number] DOB: Oct 19, 1925 Today's Date: 10/01/2022   History of present illness Pt is a 89 y.o. female with medical history significant for HTN, CAD, CKD 3a, DM, MGUS who presents to the ED by EMS with concerns for 3-day history of weakness, confusion, slurred speech and dropping things. MD assessment includes: Acute hypoxic respiratory failure with COVID-19, acute stroke due to ischemia, AKI, and generalized weakness.   OT comments  Patient received sitting EOB and agreeable to OT. She engaged in seated UB dressing and grooming tasks with set up A. Pt was then instructed in seated BUE AROM exercises for improved ROM/strength (see details below). Pt tolerated well. Pt left as received with all needs in reach. Pt is making progress toward goal completion. D/C recommendation remains appropriate. OT will continue to follow acutely.     Recommendations for follow up therapy are one component of a multi-disciplinary discharge planning process, led by the attending physician.  Recommendations may be updated based on patient status, additional functional criteria and insurance authorization.    Follow Up Recommendations  Skilled nursing-short term rehab (<3 hours/day)     Assistance Recommended at Discharge Frequent or constant Supervision/Assistance  Patient can return home with the following  Two people to help with walking and/or transfers;A lot of help with bathing/dressing/bathroom;Assistance with cooking/housework;Direct supervision/assist for medications management;Direct supervision/assist for financial management;Assist for transportation;Help with stairs or ramp for entrance   Equipment Recommendations  Other (comment) (defer to next venue of care)    Recommendations for Other Services      Precautions / Restrictions Precautions Precautions: Fall Precaution Comments: isolation (airborne for  Covid+) Restrictions Weight Bearing Restrictions: No       Mobility Bed Mobility               General bed mobility comments: received/left sitting EOB    Transfers Overall transfer level: Needs assistance                 General transfer comment: pt deferred     Balance Overall balance assessment: Needs assistance Sitting-balance support: Feet supported Sitting balance-Leahy Scale: Good                                     ADL either performed or assessed with clinical judgement   ADL Overall ADL's : Needs assistance/impaired     Grooming: Set up;Sitting;Wash/dry face           Upper Body Dressing : Set up;Sitting Upper Body Dressing Details (indicate cue type and reason): to don/doff gown                        Extremity/Trunk Assessment Upper Extremity Assessment Upper Extremity Assessment: Generalized weakness   Lower Extremity Assessment Lower Extremity Assessment: Generalized weakness   Cervical / Trunk Assessment Cervical / Trunk Assessment: Normal    Vision Patient Visual Report: No change from baseline     Perception     Praxis      Cognition Arousal/Alertness: Awake/alert Behavior During Therapy: WFL for tasks assessed/performed Overall Cognitive Status: Within Functional Limits for tasks assessed           Exercises General Exercises - Upper Extremity Shoulder Flexion: AROM, Both, 10 reps, Seated Shoulder Extension: AROM, Both, 10 reps, Seated Shoulder Horizontal ABduction: AROM, Both, 10 reps, Seated Shoulder Horizontal ADduction: AROM,  Both, 10 reps, Seated Elbow Flexion: AROM, Both, 10 reps, Seated Elbow Extension: AROM, Both, 10 reps, Seated    Shoulder Instructions       General Comments Pt pleasant and willing to do light activity but despite much encouragement did not wish to get to chair/use slide board    Pertinent Vitals/ Pain       Pain Assessment Pain Assessment: No/denies  pain  Home Living           Prior Functioning/Environment              Frequency  Min 2X/week        Progress Toward Goals  OT Goals(current goals can now be found in the care plan section)  Progress towards OT goals: Progressing toward goals  Acute Rehab OT Goals Patient Stated Goal: Get better; go home OT Goal Formulation: With patient Time For Goal Achievement: 10/07/22 Potential to Achieve Goals: [place] Discharge plan remains appropriate;Frequency remains appropriate    Co-evaluation                 AM-PAC OT "6 Clicks" Daily Activity     Outcome Measure   Help from another person eating meals?: None Help from another person taking care of personal grooming?: None Help from another person toileting, which includes using toliet, bedpan, or urinal?: A Lot Help from another person bathing (including washing, rinsing, drying)?: A Lot Help from another person to put on and taking off regular upper body clothing?: A Little Help from another person to put on and taking off regular lower body clothing?: A Lot 6 Click Score: 17    End of Session Equipment Utilized During Treatment: Oxygen  OT Visit Diagnosis: Unsteadiness on feet (R26.81);Other abnormalities of gait and mobility (R26.89);Muscle weakness (generalized) (M62.81)   Activity Tolerance Patient limited by fatigue;Patient tolerated treatment well   Patient Left in bed;with call bell/phone within reach;with bed alarm set   Nurse Communication Mobility status        Time: [id number] OT Time Calculation (min): 15 min  Charges: OT General Charges $OT Visit: 1 Visit OT Treatments $Self Care/Home Management : 8-22 mins  [facility] MS, OTR/L ascom [phone]  10/01/22, 6:46 PM

## 2022-10-01 NOTE — Progress Notes (Signed)
Family member [name] continues to voice concerns related to Ms Coraleigh's cough. This RN reviewed MAR and explained we can certainly provide PRN cough medication more often and ensure the follow up medication is provided. [name] voices concerns related to prednisone and neb treatments being discontinued. Education provided on inhalers, PRN vs scheduled, and the need for a bronchodilator which is not indicated at this time as patient is not wheezing nor short of breath; just coughing.   Ms Janiya states she does not feel her chest is tight and she does not feel short of breath. RN explained to [name] a cough is common with COVID, and I will provide cough medication as often as possible overnight. [name] voices concerns r/t patient's comfort and states "something needs to be done." [name] voices displeasure at having to wait to see if PRN cough medication works and wishes for prednisone and nebulizer treatment.  At this time, there is no respiratory distress and patient is in no visible distress. Vital signs are stable, and patient seems to be at a resting baseline other than her cough and continued need for supplemental oxygen. This RN explained to [place] I will provide PRN cough medication and inhalers as ordered + needed prior to requesting different or additional medications from APP tonight.   Space was provided for [name] to vocalize feelings and thoughts related to Ms Denay. [name] very obviously cares for Ms Reynelda and does not wish to see her suffer or uncomfortable. Will continue to provide reassurance and medications as ordered and indicated throughout the night.

## 2022-10-02 ENCOUNTER — Inpatient Hospital Stay: Payer: 59

## 2022-10-02 DIAGNOSIS — J9601 Acute respiratory failure with hypoxia: Secondary | ICD-10-CM | POA: Diagnosis not present

## 2022-10-02 LAB — GLUCOSE, CAPILLARY: Glucose-Capillary: 175 mg/dL — ABNORMAL HIGH (ref 70–99)

## 2022-10-02 MED ORDER — PREDNISONE 20 MG PO TABS: 40.0000 mg | ORAL_TABLET | Freq: Every day | ORAL | Status: DC

## 2022-10-02 MED ORDER — PREDNISONE 20 MG PO TABS
40.0000 mg | ORAL_TABLET | Freq: Every day | ORAL | Status: DC
  Administered 2022-10-02 – 2022-10-03 (×2): 40 mg via ORAL
  Filled 2022-10-02 (×2): qty 2

## 2022-10-02 MED ORDER — IPRATROPIUM-ALBUTEROL 0.5-2.5 (3) MG/3ML IN SOLN
3.0000 mL | Freq: Four times a day (QID) | RESPIRATORY_TRACT | Status: DC | PRN
  Administered 2022-10-02: 3 mL via RESPIRATORY_TRACT
  Filled 2022-10-02 (×2): qty 3

## 2022-10-02 NOTE — Progress Notes (Signed)
Was told by Dr Dwyane Dee and the pt family [name] to d/c the allergy to prednisone.

## 2022-10-02 NOTE — Consult Note (Signed)
PULMONOLOGY         Date: 10/02/2022,   MRN# [id number] Carmon Agredano [place] Aug 06, 1926     AdmissionWeight: 60.5 kg                 CurrentWeight: 57.7 kg  Referring provider: Dr Dwyane Dee   CHIEF COMPLAINT:   COVID19 acute hypoxemic respiratory failure   HISTORY OF PRESENT ILLNESS   89 F with CKD MGUS dementia HTN came in with undue fatigue , slurring of speech and worsening confusion with disequilibrium. Patient sitting up in bed during interview and family helps with details. She has had LE edema and received lasix on outpatient. She came in with labwork showing BNP >4115, her MRI brain was essentially unremarkable Patient states she feels ok.  Her son at bedside Dorian Heckle) is present and states that patient looks well.  He also states she has demential and does not walk at baseline.  She is on room air during my evaluation. He also states she has reported lightheadedness for past 6 months. Family requested additional input from pCCM due to COVID19+ status with Altetered mental status.   PAST MEDICAL HISTORY   Past Medical History:  Diagnosis Date   Arthritis    Diabetes mellitus ([place])    Glaucoma    Hyperlipidemia    Hypertension    Shortness of breath    with exertion     SURGICAL HISTORY   Past Surgical History:  Procedure Laterality Date   CARDIAC CATHETERIZATION  04/22/13   CHOLECYSTECTOMY     CORONARY ARTERY BYPASS GRAFT N/A 05/31/2013   Procedure: Coronary Artery Bypass Grafting Times Three Using Left Internal Mammary Artery and Right Saphenous Leg Vein Harvested Endoscopically;  Surgeon: Ivin Poot, MD;  Location: [place];  Service: Open Heart Surgery;  Laterality: N/A;   ERCP N/A 10/20/2020   Procedure: ENDOSCOPIC RETROGRADE CHOLANGIOPANCREATOGRAPHY (ERCP);  Surgeon: Lucilla Lame, MD;  Location: [facility] ENDOSCOPY;  Service: Endoscopy;  Laterality: N/A;   ERCP N/A 12/22/2020   Procedure: ENDOSCOPIC RETROGRADE CHOLANGIOPANCREATOGRAPHY (ERCP);  Surgeon:  Lucilla Lame, MD;  Location: [facility] ENDOSCOPY;  Service: Endoscopy;  Laterality: N/A;   EYE SURGERY     HIP SURGERY     right   INTRAOPERATIVE TRANSESOPHAGEAL ECHOCARDIOGRAM N/A 05/31/2013   Procedure: INTRAOPERATIVE TRANSESOPHAGEAL ECHOCARDIOGRAM;  Surgeon: Ivin Poot, MD;  Location: [place];  Service: Open Heart Surgery;  Laterality: N/A;   JOINT REPLACEMENT     MITRAL VALVE REPAIR N/A 05/31/2013   Procedure: MITRAL VALVE REPAIR (MVR);  Surgeon: Ivin Poot, MD;  Location: [place];  Service: Open Heart Surgery;  Laterality: N/A;   ORIF FEMUR FRACTURE Bilateral 05/26/2022   Procedure: OPEN REDUCTION INTERNAL FIXATION (ORIF) DISTAL FEMUR FRACTURE;  Surgeon: Altamese Rensselaer, MD;  Location: [place];  Service: Orthopedics;  Laterality: Bilateral;   TONSILLECTOMY       FAMILY HISTORY   Family History  Problem Relation Age of Onset   Diabetes Sister    Hypertension Mother      SOCIAL HISTORY   Social History   Tobacco Use   Smoking status: Never   Smokeless tobacco: Never  Vaping Use   Vaping Use: Never used  Substance Use Topics   Alcohol use: No   Drug use: No     MEDICATIONS    Home Medication:  Current Outpatient Rx   Order #: [id number] Class: Normal    Current Medication:  Current Facility-Administered Medications:    acetaminophen (TYLENOL) tablet 650  mg, 650 mg, Oral, Q6H PRN **OR** acetaminophen (TYLENOL) suppository 650 mg, 650 mg, Rectal, Q6H PRN, Athena Masse, MD   albuterol (VENTOLIN HFA) 108 (90 Base) MCG/ACT inhaler 2 puff, 2 puff, Inhalation, Q4H PRN, Darrick Penna, RPH, 2 puff at 09/29/22 1939   brinzolamide (AZOPT) 1 % ophthalmic suspension 1 drop, 1 drop, Both Eyes, BID, 1 drop at 10/02/22 0943 **AND** brimonidine (ALPHAGAN) 0.2 % ophthalmic solution 1 drop, 1 drop, Both Eyes, BID, Judd Gaudier V, MD, 1 drop at 10/02/22 0943   budesonide (PULMICORT) 180 MCG/ACT inhaler 2 puff, 2 puff, Inhalation, BID, Darrick Penna, RPH, 2 puff at 10/02/22  [id number]   chlorpheniramine-HYDROcodone (TUSSIONEX) 10-8 MG/5ML suspension 5 mL, 5 mL, Oral, Q12H PRN, Athena Masse, MD, 5 mL at 10/01/22 2231   clopidogrel (PLAVIX) tablet 75 mg, 75 mg, Oral, Daily, Loletha Grayer, MD, 75 mg at 10/02/22 0941   feeding supplement (GLUCERNA SHAKE) (GLUCERNA SHAKE) liquid 237 mL, 237 mL, Oral, TID BM, Wieting, Richard, MD, 237 mL at 10/02/22 0942   ferrous sulfate tablet 325 mg, 325 mg, Oral, Q breakfast, Athena Masse, MD, 325 mg at 10/02/22 [id number]   furosemide (LASIX) tablet 20 mg, 20 mg, Oral, Daily, Leslye Peer, Richard, MD, 20 mg at 10/02/22 0941   guaiFENesin-dextromethorphan (ROBITUSSIN DM) 100-10 MG/5ML syrup 10 mL, 10 mL, Oral, Q4H PRN, Athena Masse, MD, 10 mL at 10/02/22 0300   ipratropium (ATROVENT HFA) inhaler 2 puff, 2 puff, Inhalation, Q4H PRN, Darrick Penna, RPH, 2 puff at 09/28/22 1700   ipratropium-albuterol (DUONEB) 0.5-2.5 (3) MG/3ML nebulizer solution 3 mL, 3 mL, Nebulization, Q6H PRN, Shawna Clamp, MD   methocarbamol (ROBAXIN) tablet 500 mg, 500 mg, Oral, Q6H PRN, Athena Masse, MD   metoprolol succinate (TOPROL-XL) 24 hr tablet 12.5 mg, 12.5 mg, Oral, Daily, Judd Gaudier V, MD, 12.5 mg at 10/02/22 0941   ondansetron (ZOFRAN) tablet 4 mg, 4 mg, Oral, Q6H PRN **OR** ondansetron (ZOFRAN) injection 4 mg, 4 mg, Intravenous, Q6H PRN, Athena Masse, MD   potassium chloride (KLOR-CON M) CR tablet 20 mEq, 20 mEq, Oral, Daily, Judd Gaudier V, MD, 20 mEq at 10/02/22 0941   [START ON 10/03/2022] predniSONE (DELTASONE) tablet 40 mg, 40 mg, Oral, Q breakfast, Shawna Clamp, MD   sodium chloride flush (NS) 0.9 % injection 10-40 mL, 10-40 mL, Intracatheter, Q12H, Wieting, Richard, MD, 10 mL at 09/29/22 2114   sodium chloride flush (NS) 0.9 % injection 10-40 mL, 10-40 mL, Intracatheter, PRN, Leslye Peer, Richard, MD   timolol (TIMOPTIC) 0.5 % ophthalmic solution 1 drop, 1 drop, Both Eyes, BID, Athena Masse, MD, 1 drop at 10/02/22 [id number]   traMADol  (ULTRAM) tablet 50 mg, 50 mg, Oral, Q8H PRN, Athena Masse, MD    ALLERGIES   Oxycodone, Prednisone, and Sulfa antibiotics     REVIEW OF SYSTEMS    Review of Systems:  Gen:  Denies  fever, sweats, chills weigh loss  HEENT: Denies blurred vision, double vision, ear pain, eye pain, hearing loss, nose bleeds, sore throat Cardiac:  No dizziness, chest pain or heaviness, chest tightness,edema Resp:   reports dyspnea chronically  Gi: Denies swallowing difficulty, stomach pain, nausea or vomiting, diarrhea, constipation, bowel incontinence Gu:  Denies bladder incontinence, burning urine Ext:   Denies Joint pain, stiffness or swelling Skin: Denies  skin rash, easy bruising or bleeding or hives Endoc:  Denies polyuria, polydipsia , polyphagia or weight change Psych:   Denies depression, insomnia or hallucinations  Other:  All other systems negative   VS: BP 91/75   Pulse 72   Temp 98.3 F (36.8 C)   Resp 17   Ht 5' (1.524 m)   Wt 57.7 kg   SpO2 100%   BMI 24.84 kg/m      PHYSICAL EXAM    GENERAL:NAD, no fevers, chills, no weakness no fatigue HEAD: Normocephalic, atraumatic.  EYES: Pupils equal, round, reactive to light. Extraocular muscles intact. No scleral icterus.  MOUTH: Moist mucosal membrane. Dentition intact. No abscess noted.  EAR, NOSE, THROAT: Clear without exudates. No external lesions.  NECK: Supple. No thyromegaly. No nodules. No JVD.  PULMONARY: decreased breath sounds with mild rhonchi worse at bases bilaterally.  CARDIOVASCULAR: S1 and S2. Regular rate and rhythm. No murmurs, rubs, or gallops. No edema. Pedal pulses 2+ bilaterally.  GASTROINTESTINAL: Soft, nontender, nondistended. No masses. Positive bowel sounds. No hepatosplenomegaly.  MUSCULOSKELETAL: No swelling, clubbing, or edema. Range of motion full in all extremities.  NEUROLOGIC: Cranial nerves II through XII are intact. No gross focal neurological deficits. Sensation intact. Reflexes  intact.  SKIN: No ulceration, lesions, rashes, or cyanosis. Skin warm and dry. Turgor intact.  PSYCHIATRIC: Mood, affect within normal limits. The patient is awake, alert and oriented x 3. Insight, judgment intact.       IMAGING   ECHOCARDIOGRAM REPORT       Patient Name:   CRYSTALINA AVALLONE Date of Exam: 09/22/2022  Medical Rec #:  [id number]        Height:       64.0 in  Accession #:    [id number]       Weight:       133.4 lb  Date of Birth:  1925/09/16        BSA:          1.647 m  Patient Age:    26 years         BP:           147/67 mmHg  Patient Gender: F                HR:           76 bpm.  Exam Location:  [facility]   Procedure: 2D Echo, Cardiac Doppler and Color Doppler   Indications:     CHF    History:         Patient has prior history of Echocardiogram examinations,  most                  recent 05/28/2013. CHF, CAD, Prior CABG, COPD,                   Signs/Symptoms:Murmur; Risk Factors:Hypertension,  Diabetes and                   Dyslipidemia. Bilateral plerual effusion, COVID +.    Sonographer:     Wenda Low  Referring Phys:  [id number] Athena Masse  Diagnosing Phys: Ida Rogue MD   IMPRESSIONS     1. Left ventricular ejection fraction, by estimation, is 45 to 50%. The  left ventricle has mildly decreased function. The left ventricle  demonstrates global hypokinesis. There is mild left ventricular  hypertrophy. Left ventricular diastolic parameters  are consistent with Grade II diastolic dysfunction (pseudonormalization).  There is the interventricular septum is flattened in systole, consistent  with right ventricular pressure overload.   2. Right ventricular systolic function is severely reduced. The right  ventricular size is normal. Mildly increased right ventricular wall  thickness. There is moderately elevated pulmonary artery systolic  pressure. The estimated right ventricular  systolic pressure is 123XX123 mmHg.   3. Left atrial size was mildly  dilated.   4. Moderate pleural effusion in the left lateral region.   5. The mitral valve is normal in structure. Moderate mitral valve  regurgitation. No evidence of mitral stenosis. Moderate mitral annular  calcification.   6. Tricuspid valve regurgitation is moderate to severe.   7. The aortic valve is tricuspid. Aortic valve regurgitation is mild.  Aortic valve sclerosis is present, with no evidence of aortic valve  stenosis.   8. The inferior vena cava is dilated in size with >50% respiratory  variability, suggesting right atrial pressure of 8 mmHg.   FINDINGS   Left Ventricle: Left ventricular ejection fraction, by estimation, is 45  to 50%. The left ventricle has mildly decreased function. The left  ventricle demonstrates global hypokinesis. The left ventricular internal  cavity size was normal in size. There is   mild left ventricular hypertrophy. The interventricular septum is  flattened in systole, consistent with right ventricular pressure overload.  Left ventricular diastolic parameters are consistent with Grade II  diastolic dysfunction (pseudonormalization).   Right Ventricle: The right ventricular size is normal. Mildly increased  right ventricular wall thickness. Right ventricular systolic function is  severely reduced. There is moderately elevated pulmonary artery systolic  pressure. The tricuspid regurgitant  velocity is 3.08 m/s, and with an assumed right atrial pressure of 8 mmHg,  the estimated right ventricular systolic pressure is 123XX123 mmHg.   Left Atrium: Left atrial size was mildly dilated.   Right Atrium: Right atrial size was normal in size.   Pericardium: There is no evidence of pericardial effusion.   Mitral Valve: The mitral valve is normal in structure. There is mild  thickening of the mitral valve leaflet(s). Moderate mitral annular  calcification. Moderate mitral valve regurgitation. No evidence of mitral  valve stenosis. MV peak gradient, 10.4  mmHg.   The mean mitral valve gradient is 5.0 mmHg.   Tricuspid Valve: The tricuspid valve is normal in structure. Tricuspid  valve regurgitation is moderate to severe. No evidence of tricuspid  stenosis.   Aortic Valve: The aortic valve is tricuspid. Aortic valve regurgitation is  mild. Aortic valve sclerosis is present, with no evidence of aortic valve  stenosis. Aortic valve mean gradient measures 4.0 mmHg. Aortic valve peak  gradient measures 7.0 mmHg.  Aortic valve area, by VTI measures 2.03 cm.   Pulmonic Valve: The pulmonic valve was normal in structure. Pulmonic valve  regurgitation is mild. No evidence of pulmonic stenosis.   Aorta: The aortic root is normal in size and structure.   Venous: The inferior vena cava is dilated in size with greater than 50%  respiratory variability, suggesting right atrial pressure of 8 mmHg.   IAS/Shunts: No atrial level shunt detected by color flow Doppler.   Additional Comments: There is a moderate pleural effusion in the left  lateral region.     LEFT VENTRICLE  PLAX 2D  LVIDd:         4.30 cm   Diastology  LVIDs:         3.50 cm   LV e' medial:    9.90 cm/s  LV PW:         0.80 cm   LV E/e' medial:  14.2  LV IVS:  1.10 cm   LV e' lateral:   6.53 cm/s  LVOT diam:     2.00 cm   LV E/e' lateral: 21.6  LV SV:         58  LV SV Index:   35  LVOT Area:     3.14 cm     RIGHT VENTRICLE  RV Basal diam:  4.20 cm  RV Mid diam:    3.60 cm   LEFT ATRIUM             Index        RIGHT ATRIUM           Index  LA diam:        4.30 cm 2.61 cm/m   RA Area:     18.60 cm  LA Vol (A2C):   76.0 ml 46.14 ml/m  RA Volume:   55.00 ml  33.39 ml/m  LA Vol (A4C):   48.2 ml 29.26 ml/m  LA Biplane Vol: 61.4 ml 37.28 ml/m   AORTIC VALVE                    PULMONIC VALVE  AV Area (Vmax):    2.07 cm     PV Vmax:       0.83 m/s  AV Area (Vmean):   1.79 cm     PV Peak grad:  2.8 mmHg  AV Area (VTI):     2.03 cm  AV Vmax:           132.00  cm/s  AV Vmean:          90.400 cm/s  AV VTI:            0.285 m  AV Peak Grad:      7.0 mmHg  AV Mean Grad:      4.0 mmHg  LVOT Vmax:         87.00 cm/s  LVOT Vmean:        51.400 cm/s  LVOT VTI:          0.184 m  LVOT/AV VTI ratio: 0.65    AORTA  Ao Root diam: 2.90 cm  Ao Asc diam:  2.70 cm   MITRAL VALVE                TRICUSPID VALVE  MV Area (PHT): 2.66 cm     TR Peak grad:   37.9 mmHg  MV Area VTI:   1.35 cm     TR Vmax:        308.00 cm/s  MV Peak grad:  10.4 mmHg  MV Mean grad:  5.0 mmHg     SHUNTS  MV Vmax:       1.62 m/s     Systemic VTI:  0.18 m  MV Vmean:      102.0 cm/s   Systemic Diam: 2.00 cm  MV Decel Time: 285 msec  MV E velocity: 141.00 cm/s  MV A velocity: 98.00 cm/s  MV E/A ratio:  1.44   Ida Rogue MD  Electronically signed by Ida Rogue MD  Signature Date/Time: 09/22/2022/5:21:19 PM        Final     ASSESSMENT/PLAN    Acute COVID19 LRTI -Does not require Remdesevir antiviral - pharmacy protocol 5 d -Diuresis - Agree with lasix 20 po, patient with hypotension I have dcd metoprolol for now- monitor UOP - utilize external urinary catheter if possible -encourage to use IS and Acapella device for bronchopulmonary  hygiene when able -consider monitoring -PT/OT when possible -due to presyncopal symptoms and elevated cardiac biomarkers would like to review VQ scan and DVT study to rule out pulmonary venous thromboembolism -patient shares she's feels close to baseline and may be evaluated on outpatient if above studies are unremarkable.  -noted TTE done 09/22/22             Thank you for allowing me to participate in the care of this patient.   Patient/Family are satisfied with care plan and all questions have been answered.    Provider disclosure: Patient with at least one acute or chronic illness or injury that poses a threat to life or bodily function and is being managed actively during this encounter.  All of the below services  have been performed independently by signing provider:  review of prior documentation from internal and or external health records.  Review of previous and current lab results.  Interview and comprehensive assessment during patient visit today. Review of current and previous chest radiographs/CT scans. Discussion of management and test interpretation with health care team and patient/family.   This document was prepared using Dragon voice recognition software and may include unintentional dictation errors.     Ottie Glazier, M.D.  Division of Pulmonary & Critical Care Medicine

## 2022-10-02 NOTE — Plan of Care (Signed)
?  Problem: Activity: ?Goal: Capacity to carry out activities will improve ?Outcome: Progressing ?  ?

## 2022-10-02 NOTE — Progress Notes (Signed)
PROGRESS NOTE    Gabriela [name]  [id number] DOB: 04-12-1926 DOA: 09/21/2022  PCP: [name], MD   Brief Narrative:  This 89 y.o. female with PMH significant for HTN, CAD, CKD3a, DM II, MGUS who presents to the ED by EMS with 3-day history of weakness, confusion, slurred speech and dropping things.  Most of the history is given by the granddaughter at the bedside who stated that she noticed that her grandmother has increasingly worsening swelling of the bilateral lower extremities up to the thighs in spite of taking her lasix 10 mg daily.  She has no prior history of CHF and states she was prescribed Lasix by her PCP.  She took her to the nephrologist on 09/15/22 who changed  Lasix to torsemide and ordered compression socks.  She stated that since the medication was changed, her grandmother continue to have swelling and seem to be weaker and she also has developed a cough that has become increasingly more congested. She is not short of breath,  at baseline she does not move around a lot.  ED course and data review: Afebrile with unremarkable vitals. Labs significant for troponin of 60 and BNP 4115.  WBC 11,500 with hemoglobin 8.8, platelets 51,000. Creatinine 1.57 above baseline of 0.95 a month ago, respiratory viral panel positive for COVID. Chest x-ray showing bilateral pleural effusions with basilar atelectasis. MRI ordered to evaluate for stroke. Hospitalist consulted for admission. MRI showed a 7 mm acute ischemic nonhemorrhagic right cerebral infarct.   Assessment & Plan:   Principal Problem:   Acute hypoxic respiratory failure (HCC) Active Problems:   Acute stroke due to ischemia (HCC)   Chronic systolic CHF (congestive heart failure) (HCC)   COVID-19   Acute renal failure superimposed on stage 3a chronic kidney disease (HCC)   Bilateral pleural effusion   Acute metabolic encephalopathy   Generalized weakness   Essential (primary) hypertension   Arthritis,  degenerative   MGUS (monoclonal gammopathy of unknown significance)   Type 2 diabetes mellitus with circulatory disorder (HCC)   Myocardial injury  Acute hypoxic respiratory failure: Improved.  SpO2  100% on 3 L. Continue Oxygen supplementation.   Likely secondary to COVID-19.  Continue prednisone and Lasix. Continue nebulized bronchodilators. Pulmonology consulted.  VQ scan ordered to rule out PE.   Acute stroke due to ischemia: Right cerebellar area.  I do not think she needs dual antiplatelet agent at this time given low platelet count. Currently on Plavix. As per the Neurology benefits of statins are outweighed by the risks of liver damage and myopathy.   Chronic systolic CHF: Last LVEF [id number].  Continue Lasix 20 mg daily. Appears euvolemic.   COVID-19: Continue supportive care. Continue airborne precautions.   Bilateral pleural effusion: Continue to monitor, shortness of breath has improved.   AKI on CKD stage IIIa: Creatinine 1.57 on arrival, above baseline of 0.95 a month prior.   Creatinine 1.11 today.  Resumed Lasix and watch creatinine.   Acute metabolic encephalopathy: Resolved.  Generalized weakness: At baseline, Patient is currently mostly sedentary following a bilateral femoral fracture in November 2023 while working with PT.  PT recommended SNF. Patient lives alone and has 24-hour family support all the time. As per patient's granddaughter, they will take her home if she does better with board transfers.  Otherwise they will consider rehab. TOC notified and  family agreed for SNF placement.   Elevated troponin: Troponin slightly elevated but we can get this with a stroke also.   Type  2 diabetes mellitus: With her hands and feet being cold I do not think the fingersticks are accurate at this time.   Hb A1c only 6.8.  Will continue to watch sugars with daily chemistry.   MGUS (monoclonal gammopathy of unknown significance) Anemia and thrombocytopenia: Last  hemoglobin 10.2 and last platelet count 41. Continue  to monitor.   Osteoarthritis: Continue Tylenol and Robaxin with tramadol as needed.   Essential hypertension: Continue metoprolol.   DVT prophylaxis:  SCDs Code Status: DNR Family Communication: Spoke with daughter on phone. Disposition Plan:    Status is: Inpatient Remains inpatient appropriate because: Admitted for acute hypoxic respiratory failure in the setting of COVID-19 infection.  PT and OT recommended SNF but family wants to take her home with home health services.  Patient has 24-hour care.  Patient needs to transfer from bed to chair before family can take her home. TOC notified.  Looking for SNF place.  Medically clear,  awaiting SNF authorization  Consultants:  None.  Procedures: None.  Antimicrobials:  Anti-infectives (From admission, onward)    Start     Dose/Rate Route Frequency Ordered Stop   09/23/22 1000  remdesivir 100 mg in sodium chloride 0.9 % 100 mL IVPB  Status:  Discontinued       See Hyperspace for full Linked Orders Report.   100 mg 200 mL/hr over 30 Minutes Intravenous Daily 09/22/22 0000 09/22/22 0840   09/22/22 0430  remdesivir 100 mg in sodium chloride 0.9 % 100 mL IVPB       See Hyperspace for full Linked Orders Report.   100 mg 200 mL/hr over 30 Minutes Intravenous  Once 09/22/22 0000 09/23/22 0700   09/22/22 0400  remdesivir 100 mg in sodium chloride 0.9 % 100 mL IVPB       See Hyperspace for full Linked Orders Report.   100 mg 200 mL/hr over 30 Minutes Intravenous  Once 09/22/22 0000 09/22/22 0456      Subjective: Patient was seen and examined at bedside. Overnight events noted.   Patient reports feeling weak but appears much more comfortable. She was sitting on the chair, remains on 3 L of supplemental oxygen.  Objective: Vitals:   10/01/22 2249 10/02/22 0011 10/02/22 0530 10/02/22 1210  BP:  106/81 (!) 99/54 91/75  Pulse:  81 87 72  Resp:  20 17   Temp:  98.7 F (37.1 C)  98.7 F (37.1 C) 98.3 F (36.8 C)  TempSrc:      SpO2:  100% 95% 100%  Weight: 57.7 kg     Height: 5' (1.524 m)       Intake/Output Summary (Last 24 hours) at 10/02/2022 1440 Last data filed at 10/01/2022 1947 Gross per 24 hour  Intake 480 ml  Output --  Net 480 ml   Filed Weights   09/29/22 1553 10/01/22 0958 10/01/22 2249  Weight: 56.4 kg 57.7 kg 57.7 kg    Examination:  General exam: Appears comfortable, not in any acute distress.  Deconditioned. Respiratory system: CTA bilaterally, Respiratory effort normal.  RR 14 Cardiovascular system: S1 & S2 heard, regular rate and rhythm, no murmur. Gastrointestinal system: Abdomen is soft, non tender, non distended, BS+ Central nervous system: Alert and oriented x 2, no focal neurological deficits. Extremities: No edema, no cyanosis, no clubbing Skin: No rashes, lesions or ulcers Psychiatry:  Mood & affect appropriate.     Data Reviewed: I have personally reviewed following labs and imaging studies  CBC: Recent Labs  Lab  09/26/22 [id number] 09/27/22 0546 09/28/22 0731  WBC 11.4* 14.3* 10.0  HGB 9.2* 8.4* 9.0*  HCT 29.8* 27.4* 29.5*  MCV 79.5* 79.7* 79.9*  PLT 37* 39* 39*   Basic Metabolic Panel: Recent Labs  Lab 09/26/22 0421 09/27/22 0546 09/28/22 0731  NA 143 147* 145  K 4.0 4.7 4.4  CL 113* 112* 113*  CO2 21* 26 25  GLUCOSE 199* 127* 160*  BUN 33* 34* 37*  CREATININE 1.19* 1.14* 1.11*  CALCIUM 7.5* 8.1* 8.0*  MG  --  1.9 2.0  PHOS  --  2.8 3.1   GFR: Estimated Creatinine Clearance: 24.7 mL/min (A) (by C-G formula based on SCr of 1.11 mg/dL (H)). Liver Function Tests: No results for input(s): "AST", "ALT", "ALKPHOS", "BILITOT", "PROT", "ALBUMIN" in the last 168 hours.  No results for input(s): "LIPASE", "AMYLASE" in the last 168 hours. No results for input(s): "AMMONIA" in the last 168 hours. Coagulation Profile: No results for input(s): "INR", "PROTIME" in the last 168 hours. Cardiac Enzymes: No results  for input(s): "CKTOTAL", "CKMB", "CKMBINDEX", "TROPONINI" in the last 168 hours. BNP (last 3 results) No results for input(s): "PROBNP" in the last 8760 hours. HbA1C: No results for input(s): "HGBA1C" in the last 72 hours. CBG: No results for input(s): "GLUCAP" in the last 168 hours.  Lipid Profile: No results for input(s): "CHOL", "HDL", "LDLCALC", "TRIG", "CHOLHDL", "LDLDIRECT" in the last 72 hours.  Thyroid Function Tests: No results for input(s): "TSH", "T4TOTAL", "FREET4", "T3FREE", "THYROIDAB" in the last 72 hours. Anemia Panel: No results for input(s): "VITAMINB12", "FOLATE", "FERRITIN", "TIBC", "IRON", "RETICCTPCT" in the last 72 hours. Sepsis Labs: No results for input(s): "PROCALCITON", "LATICACIDVEN" in the last 168 hours.  No results found for this or any previous visit (from the past 240 hour(s)).   Radiology Studies: DG Chest Port 1 View  Result Date: 09/30/2022 CLINICAL DATA:  Cough.  Edema EXAM: PORTABLE CHEST 1 VIEW COMPARISON:  09/25/2022 FINDINGS: Status post median sternotomy. Enlarged heart with calcified aorta. Bilateral small pleural effusions with adjacent opacities. Vascular congestion. No pneumothorax. Overlapping cardiac leads IMPRESSION: Postop chest with enlarged cardiopericardial silhouette. Bilateral small pleural effusions with adjacent opacities and vascular congestion Electronically Signed   By: Jill Side M.D.   On: 09/30/2022 17:42    Scheduled Meds:  brinzolamide  1 drop Both Eyes BID   And   brimonidine  1 drop Both Eyes BID   budesonide  2 puff Inhalation BID   clopidogrel  75 mg Oral Daily   feeding supplement (GLUCERNA SHAKE)  237 mL Oral TID BM   ferrous sulfate  325 mg Oral Q breakfast   furosemide  20 mg Oral Daily   potassium chloride  20 mEq Oral Daily   predniSONE  40 mg Oral Q breakfast   sodium chloride flush  10-40 mL Intracatheter Q12H   timolol  1 drop Both Eyes BID   Continuous Infusions:   LOS: 11 days    Time spent:  35 min    Damonica Chopra, MD [facility]   If 7PM-7AM, please contact night-coverage

## 2022-10-02 NOTE — Progress Notes (Signed)
Pt/s grand daughter Hassan Rowan wanted to know why the pt's blood glucose wasn't being checked anymore. Contacted provider and let him know it hasn't been checked since 2/9. Also the family is upset about not getting neb treatments. This nurse explained that the respiratory therapist stated would be here when she could because she was in the ED.

## 2022-10-02 NOTE — Progress Notes (Addendum)
Gabriela [name] continues to voice concerns that "it is not normal for someone to be awake 24 hours coughing." We discussed how it is not "normal" to have COVID, CHF, and other processes happening concurrently, either. Patient sleeping while this RN was in the room having a conversation with family member [name] Gabriela [name] voices concerns related to Gabriela [name]'s respiratory status. This RN explained vitals are stable, the patient is not in any distress (patient was sleeping throughout this conversation), no gasping for air, nor any wheezing noted in lung fields, nor dyspnea or tachypnea at rest. Patient does have sporadic coughing fits, and as I stated to the patient and family earlier, PRN cough medicine was given. Gabriela [name] states patient was "whining, talking out of her head, and hallucinating." We talked about how that may be an effect of one of the PRN medications; this was not noted in the room. While patient was awake coughing, I asked where we were and she states we are in the hospital. Gabriela [name] continues to state she is uncomfortable with Gabriela [name] being awake for so long and coughing. Explained there may be a lingering cough for days to weeks to months, and as long as the patient is not in respiratory distress we will continue to give the ordered PRNs and keep an eye on her.  Again, patient is in NO acute distress. Patient noted to be sleeping while this RN was in the room. Patient provided PRN cough medication- see MAR. No posturing noted at this time.No hallucinating noted by this RN currently.

## 2022-10-02 NOTE — Plan of Care (Signed)
  Problem: Education: Goal: Ability to demonstrate management of disease process will improve Outcome: Progressing Goal: Ability to verbalize understanding of medication therapies will improve Outcome: Progressing Goal: Individualized Educational Video(s) Outcome: Progressing   Problem: Activity: Goal: Capacity to carry out activities will improve Outcome: Progressing   Problem: Cardiac: Goal: Ability to achieve and maintain adequate cardiopulmonary perfusion will improve Outcome: Progressing   Problem: Education: Goal: Knowledge of risk factors and measures for prevention of condition will improve Outcome: Progressing   Problem: Coping: Goal: Psychosocial and spiritual needs will be supported Outcome: Progressing   Problem: Respiratory: Goal: Will maintain a patent airway Outcome: Progressing Goal: Complications related to the disease process, condition or treatment will be avoided or minimized Outcome: Progressing   Problem: Education: Goal: Knowledge of General Education information will improve Description: Including pain rating scale, medication(s)/side effects and non-pharmacologic comfort measures Outcome: Progressing   Problem: Health Behavior/Discharge Planning: Goal: Ability to manage health-related needs will improve Outcome: Progressing   Problem: Clinical Measurements: Goal: Ability to maintain clinical measurements within normal limits will improve Outcome: Progressing Goal: Will remain free from infection Outcome: Progressing Goal: Diagnostic test results will improve Outcome: Progressing Goal: Respiratory complications will improve Outcome: Progressing Goal: Cardiovascular complication will be avoided Outcome: Progressing   Problem: Activity: Goal: Risk for activity intolerance will decrease Outcome: Progressing   Problem: Nutrition: Goal: Adequate nutrition will be maintained Outcome: Progressing   Problem: Coping: Goal: Level of anxiety  will decrease Outcome: Progressing   Problem: Elimination: Goal: Will not experience complications related to bowel motility Outcome: Progressing Goal: Will not experience complications related to urinary retention Outcome: Progressing   Problem: Pain Managment: Goal: General experience of comfort will improve Outcome: Progressing   Problem: Safety: Goal: Ability to remain free from injury will improve Outcome: Progressing   Problem: Skin Integrity: Goal: Risk for impaired skin integrity will decrease Outcome: Progressing   Problem: Education: Goal: Knowledge of disease or condition will improve Outcome: Progressing Goal: Knowledge of secondary prevention will improve (MUST DOCUMENT ALL) Outcome: Progressing Goal: Knowledge of patient specific risk factors will improve [place]ta [place] N/A or DELETE if not current risk factor) Outcome: Progressing   Problem: Ischemic Stroke/TIA Tissue Perfusion: Goal: Complications of ischemic stroke/TIA will be minimized Outcome: Progressing   Problem: Coping: Goal: Will verbalize positive feelings about self Outcome: Progressing Goal: Will identify appropriate support needs Outcome: Progressing   Problem: Health Behavior/Discharge Planning: Goal: Ability to manage health-related needs will improve Outcome: Progressing Goal: Goals will be collaboratively established with patient/family Outcome: Progressing   Problem: Self-Care: Goal: Ability to participate in self-care as condition permits will improve Outcome: Progressing Goal: Verbalization of feelings and concerns over difficulty with self-care will improve Outcome: Progressing Goal: Ability to communicate needs accurately will improve Outcome: Progressing   Problem: Nutrition: Goal: Risk of aspiration will decrease Outcome: Progressing Goal: Dietary intake will improve Outcome: Progressing

## 2022-10-02 NOTE — Progress Notes (Signed)
[name] states she wants to restart prednisone and nebulizers for Gabriela [name], along with a pulmonary consult.

## 2022-10-03 ENCOUNTER — Other Ambulatory Visit: Payer: Self-pay | Admitting: Family Medicine

## 2022-10-03 ENCOUNTER — Inpatient Hospital Stay: Payer: 59

## 2022-10-03 DIAGNOSIS — Z8616 Personal history of COVID-19: Secondary | ICD-10-CM | POA: Diagnosis not present

## 2022-10-03 DIAGNOSIS — J9690 Respiratory failure, unspecified, unspecified whether with hypoxia or hypercapnia: Secondary | ICD-10-CM | POA: Diagnosis not present

## 2022-10-03 DIAGNOSIS — J209 Acute bronchitis, unspecified: Secondary | ICD-10-CM | POA: Diagnosis not present

## 2022-10-03 DIAGNOSIS — J45909 Unspecified asthma, uncomplicated: Secondary | ICD-10-CM | POA: Diagnosis not present

## 2022-10-03 DIAGNOSIS — J811 Chronic pulmonary edema: Secondary | ICD-10-CM | POA: Diagnosis not present

## 2022-10-03 DIAGNOSIS — E870 Hyperosmolality and hypernatremia: Secondary | ICD-10-CM | POA: Diagnosis not present

## 2022-10-03 DIAGNOSIS — I5022 Chronic systolic (congestive) heart failure: Secondary | ICD-10-CM | POA: Diagnosis not present

## 2022-10-03 DIAGNOSIS — E875 Hyperkalemia: Secondary | ICD-10-CM | POA: Diagnosis not present

## 2022-10-03 DIAGNOSIS — N183 Chronic kidney disease, stage 3 unspecified: Secondary | ICD-10-CM | POA: Diagnosis not present

## 2022-10-03 DIAGNOSIS — J90 Pleural effusion, not elsewhere classified: Secondary | ICD-10-CM | POA: Diagnosis not present

## 2022-10-03 DIAGNOSIS — R600 Localized edema: Secondary | ICD-10-CM | POA: Diagnosis not present

## 2022-10-03 DIAGNOSIS — J9601 Acute respiratory failure with hypoxia: Secondary | ICD-10-CM | POA: Diagnosis not present

## 2022-10-03 DIAGNOSIS — Z9981 Dependence on supplemental oxygen: Secondary | ICD-10-CM | POA: Diagnosis not present

## 2022-10-03 DIAGNOSIS — J9620 Acute and chronic respiratory failure, unspecified whether with hypoxia or hypercapnia: Secondary | ICD-10-CM | POA: Diagnosis not present

## 2022-10-03 DIAGNOSIS — J208 Acute bronchitis due to other specified organisms: Secondary | ICD-10-CM | POA: Diagnosis not present

## 2022-10-03 DIAGNOSIS — D631 Anemia in chronic kidney disease: Secondary | ICD-10-CM | POA: Diagnosis not present

## 2022-10-03 DIAGNOSIS — Z743 Need for continuous supervision: Secondary | ICD-10-CM | POA: Diagnosis not present

## 2022-10-03 DIAGNOSIS — E1122 Type 2 diabetes mellitus with diabetic chronic kidney disease: Secondary | ICD-10-CM | POA: Diagnosis not present

## 2022-10-03 DIAGNOSIS — M6281 Muscle weakness (generalized): Secondary | ICD-10-CM | POA: Diagnosis not present

## 2022-10-03 DIAGNOSIS — E559 Vitamin D deficiency, unspecified: Secondary | ICD-10-CM | POA: Diagnosis not present

## 2022-10-03 DIAGNOSIS — M62838 Other muscle spasm: Secondary | ICD-10-CM | POA: Diagnosis not present

## 2022-10-03 DIAGNOSIS — I69398 Other sequelae of cerebral infarction: Secondary | ICD-10-CM | POA: Diagnosis not present

## 2022-10-03 DIAGNOSIS — J9811 Atelectasis: Secondary | ICD-10-CM | POA: Diagnosis not present

## 2022-10-03 DIAGNOSIS — I2510 Atherosclerotic heart disease of native coronary artery without angina pectoris: Secondary | ICD-10-CM | POA: Diagnosis not present

## 2022-10-03 DIAGNOSIS — I69391 Dysphagia following cerebral infarction: Secondary | ICD-10-CM | POA: Diagnosis not present

## 2022-10-03 DIAGNOSIS — I130 Hypertensive heart and chronic kidney disease with heart failure and stage 1 through stage 4 chronic kidney disease, or unspecified chronic kidney disease: Secondary | ICD-10-CM | POA: Diagnosis not present

## 2022-10-03 DIAGNOSIS — Z7401 Bed confinement status: Secondary | ICD-10-CM | POA: Diagnosis not present

## 2022-10-03 DIAGNOSIS — D472 Monoclonal gammopathy: Secondary | ICD-10-CM | POA: Diagnosis not present

## 2022-10-03 DIAGNOSIS — R0602 Shortness of breath: Secondary | ICD-10-CM | POA: Diagnosis not present

## 2022-10-03 DIAGNOSIS — E1159 Type 2 diabetes mellitus with other circulatory complications: Secondary | ICD-10-CM | POA: Diagnosis not present

## 2022-10-03 DIAGNOSIS — M1990 Unspecified osteoarthritis, unspecified site: Secondary | ICD-10-CM | POA: Diagnosis not present

## 2022-10-03 DIAGNOSIS — E1100 Type 2 diabetes mellitus with hyperosmolarity without nonketotic hyperglycemic-hyperosmolar coma (NKHHC): Secondary | ICD-10-CM | POA: Diagnosis not present

## 2022-10-03 DIAGNOSIS — H409 Unspecified glaucoma: Secondary | ICD-10-CM | POA: Diagnosis not present

## 2022-10-03 DIAGNOSIS — I639 Cerebral infarction, unspecified: Secondary | ICD-10-CM | POA: Diagnosis not present

## 2022-10-03 DIAGNOSIS — G459 Transient cerebral ischemic attack, unspecified: Secondary | ICD-10-CM | POA: Diagnosis not present

## 2022-10-03 DIAGNOSIS — R1311 Dysphagia, oral phase: Secondary | ICD-10-CM | POA: Diagnosis not present

## 2022-10-03 DIAGNOSIS — I69322 Dysarthria following cerebral infarction: Secondary | ICD-10-CM | POA: Diagnosis not present

## 2022-10-03 DIAGNOSIS — T148XXA Other injury of unspecified body region, initial encounter: Secondary | ICD-10-CM | POA: Diagnosis not present

## 2022-10-03 DIAGNOSIS — R079 Chest pain, unspecified: Secondary | ICD-10-CM | POA: Diagnosis not present

## 2022-10-03 DIAGNOSIS — I2699 Other pulmonary embolism without acute cor pulmonale: Secondary | ICD-10-CM | POA: Diagnosis not present

## 2022-10-03 DIAGNOSIS — R7989 Other specified abnormal findings of blood chemistry: Secondary | ICD-10-CM | POA: Diagnosis not present

## 2022-10-03 DIAGNOSIS — I10 Essential (primary) hypertension: Secondary | ICD-10-CM | POA: Diagnosis not present

## 2022-10-03 DIAGNOSIS — N1831 Chronic kidney disease, stage 3a: Secondary | ICD-10-CM | POA: Diagnosis not present

## 2022-10-03 DIAGNOSIS — N189 Chronic kidney disease, unspecified: Secondary | ICD-10-CM | POA: Diagnosis not present

## 2022-10-03 DIAGNOSIS — D696 Thrombocytopenia, unspecified: Secondary | ICD-10-CM | POA: Diagnosis not present

## 2022-10-03 DIAGNOSIS — M21371 Foot drop, right foot: Secondary | ICD-10-CM | POA: Diagnosis not present

## 2022-10-03 DIAGNOSIS — R0902 Hypoxemia: Secondary | ICD-10-CM | POA: Diagnosis not present

## 2022-10-03 DIAGNOSIS — I69392 Facial weakness following cerebral infarction: Secondary | ICD-10-CM | POA: Diagnosis not present

## 2022-10-03 DIAGNOSIS — S728X9A Other fracture of unspecified femur, initial encounter for closed fracture: Secondary | ICD-10-CM | POA: Diagnosis not present

## 2022-10-03 DIAGNOSIS — U071 COVID-19: Secondary | ICD-10-CM | POA: Diagnosis not present

## 2022-10-03 LAB — BASIC METABOLIC PANEL
Anion gap: 7 (ref 5–15)
BUN: 43 mg/dL — ABNORMAL HIGH (ref 8–23)
CO2: 25 mmol/L (ref 22–32)
Calcium: 8.1 mg/dL — ABNORMAL LOW (ref 8.9–10.3)
Chloride: 115 mmol/L — ABNORMAL HIGH (ref 98–111)
Creatinine, Ser: 1.28 mg/dL — ABNORMAL HIGH (ref 0.44–1.00)
GFR, Estimated: 39 mL/min — ABNORMAL LOW (ref >60)
Glucose, Bld: 182 mg/dL — ABNORMAL HIGH (ref 70–99)
Potassium: 4.7 mmol/L (ref 3.5–5.1)
Sodium: 147 mmol/L — ABNORMAL HIGH (ref 135–145)

## 2022-10-03 LAB — CBC
HCT: 25.4 % — ABNORMAL LOW (ref 36.0–46.0)
Hemoglobin: 7.9 g/dL — ABNORMAL LOW (ref 12.0–15.0)
MCH: 24.7 pg — ABNORMAL LOW (ref 26.0–34.0)
MCHC: 31.1 g/dL (ref 30.0–36.0)
MCV: 79.4 fL — ABNORMAL LOW (ref 80.0–100.0)
Platelets: 73 10*3/uL — ABNORMAL LOW (ref 150–400)
RBC: 3.2 MIL/uL — ABNORMAL LOW (ref 3.87–5.11)
RDW: 20.9 % — ABNORMAL HIGH (ref 11.5–15.5)
WBC: 19.1 10*3/uL — ABNORMAL HIGH (ref 4.0–10.5)
nRBC: 0 % (ref 0.0–0.2)

## 2022-10-03 LAB — PHOSPHORUS: Phosphorus: 2.7 mg/dL (ref 2.5–4.6)

## 2022-10-03 LAB — MAGNESIUM: Magnesium: 2.1 mg/dL (ref 1.7–2.4)

## 2022-10-03 MED ORDER — FUROSEMIDE 20 MG PO TABS: 20.0000 mg | ORAL_TABLET | Freq: Every day | ORAL | 0 refills | Status: DC

## 2022-10-03 MED ORDER — PREDNISONE 20 MG PO TABS: 40.0000 mg | ORAL_TABLET | Freq: Every day | ORAL | 0 refills | Status: AC

## 2022-10-03 MED ORDER — TECHNETIUM TO 99M ALBUMIN AGGREGATED
4.0000 | Freq: Once | INTRAVENOUS | Status: AC | PRN
  Administered 2022-10-03: 4.6 via INTRAVENOUS

## 2022-10-03 MED ORDER — LACTULOSE 10 GM/15ML PO SOLN
20.0000 g | Freq: Once | ORAL | Status: AC
  Administered 2022-10-03: 20 g via ORAL
  Filled 2022-10-03: qty 30

## 2022-10-03 MED ORDER — IPRATROPIUM-ALBUTEROL 0.5-2.5 (3) MG/3ML IN SOLN
3.0000 mL | Freq: Three times a day (TID) | RESPIRATORY_TRACT | Status: DC
  Administered 2022-10-03: 3 mL via RESPIRATORY_TRACT
  Filled 2022-10-03: qty 3

## 2022-10-03 MED ORDER — TRAMADOL HCL 50 MG PO TABS: 50.0000 mg | ORAL_TABLET | Freq: Three times a day (TID) | ORAL | 0 refills | Status: DC | PRN

## 2022-10-03 NOTE — Care Management Important Message (Signed)
Important Message  Patient Details  Name: Gabriela [name] MRN: [id number] Date of Birth: [birth year]   Medicare Important Message Given:  Yes     Dannette Barbara 10/03/2022, 10:19 AM

## 2022-10-03 NOTE — Progress Notes (Signed)
PULMONOLOGY         Date: 10/03/2022,   MRN# [id number] Pyper Dages Winzer 27-Jun-1926     AdmissionWeight: 60.5 kg                 CurrentWeight: 58.6 kg  Referring provider: Dr Dwyane Dee   CHIEF COMPLAINT:   COVID19 acute hypoxemic respiratory failure   HISTORY OF PRESENT ILLNESS   89 F with CKD MGUS dementia HTN came in with undue fatigue , slurring of speech and worsening confusion with disequilibrium. Patient sitting up in bed during interview and family helps with details. She has had LE edema and received lasix on outpatient. She came in with labwork showing BNP >4115, her MRI brain was essentially unremarkable Patient states she feels ok.  Her son at bedside Dorian Heckle) is present and states that patient looks well.  He also states she has demential and does not walk at baseline.  She is on room air during my evaluation. He also states she has reported lightheadedness for past 6 months. Family requested additional input from pCCM due to COVID19+ status with Altetered mental status.   10/03/22- patient seen at bedside with family. S/p DVT US no clots noted.   PAST MEDICAL HISTORY   Past Medical History:  Diagnosis Date   Arthritis    Diabetes mellitus ([place])    Glaucoma    Hyperlipidemia    Hypertension    Shortness of breath    with exertion     SURGICAL HISTORY   Past Surgical History:  Procedure Laterality Date   CARDIAC CATHETERIZATION  04/22/13   CHOLECYSTECTOMY     CORONARY ARTERY BYPASS GRAFT N/A 05/31/2013   Procedure: Coronary Artery Bypass Grafting Times Three Using Left Internal Mammary Artery and Right Saphenous Leg Vein Harvested Endoscopically;  Surgeon: Ivin Poot, MD;  Location: [facility];  Service: Open Heart Surgery;  Laterality: N/A;   ERCP N/A 10/20/2020   Procedure: ENDOSCOPIC RETROGRADE CHOLANGIOPANCREATOGRAPHY (ERCP);  Surgeon: Lucilla Lame, MD;  Location: [facility] ENDOSCOPY;  Service: Endoscopy;  Laterality: N/A;   ERCP N/A 12/22/2020    Procedure: ENDOSCOPIC RETROGRADE CHOLANGIOPANCREATOGRAPHY (ERCP);  Surgeon: Lucilla Lame, MD;  Location[facility] ENDOSCOPY;  Service: Endoscopy;  Laterality: N/A;   EYE SURGERY     HIP SURGERY     right   INTRAOPERATIVE TRANSESOPHAGEAL ECHOCARDIOGRAM N/A 05/31/2013   Procedure: INTRAOPERATIVE TRANSESOPHAGEAL ECHOCARDIOGRAM;  Surgeon: Ivin Poot, MD;  Location: [place];  Service: Open Heart Surgery;  Laterality: N/A;   JOINT REPLACEMENT     MITRAL VALVE REPAIR N/A 05/31/2013   Procedure: MITRAL VALVE REPAIR (MVR);  Surgeon: Ivin Poot, MD;  Location: [place];  Service: Open Heart Surgery;  Laterality: N/A;   ORIF FEMUR FRACTURE Bilateral 05/26/2022   Procedure: OPEN REDUCTION INTERNAL FIXATION (ORIF) DISTAL FEMUR FRACTURE;  Surgeon: Altamese Byng, MD;  Location: [place];  Service: Orthopedics;  Laterality: Bilateral;   TONSILLECTOMY       FAMILY HISTORY   Family History  Problem Relation Age of Onset   Diabetes Sister    Hypertension Mother      SOCIAL HISTORY   Social History   Tobacco Use   Smoking status: Never   Smokeless tobacco: Never  Vaping Use   Vaping Use: Never used  Substance Use Topics   Alcohol use: No   Drug use: No     MEDICATIONS    Home Medication:  Current Outpatient Rx   Order #: [id number] Class: Normal  Current Medication:  Current Facility-Administered Medications:    acetaminophen (TYLENOL) tablet 650 mg, 650 mg, Oral, Q6H PRN **OR** acetaminophen (TYLENOL) suppository 650 mg, 650 mg, Rectal, Q6H PRN, Athena Masse, MD   albuterol (VENTOLIN HFA) 108 (90 Base) MCG/ACT inhaler 2 puff, 2 puff, Inhalation, Q4H PRN, Darrick Penna, RPH, 2 puff at 09/29/22 1939   brinzolamide (AZOPT) 1 % ophthalmic suspension 1 drop, 1 drop, Both Eyes, BID, 1 drop at 10/02/22 2158 **AND** brimonidine (ALPHAGAN) 0.2 % ophthalmic solution 1 drop, 1 drop, Both Eyes, BID, Judd Gaudier V, MD, 1 drop at 10/02/22 2157   budesonide (PULMICORT) 180 MCG/ACT inhaler 2  puff, 2 puff, Inhalation, BID, Darrick Penna, RPH, 2 puff at 10/03/22 [id number]   chlorpheniramine-HYDROcodone (TUSSIONEX) 10-8 MG/5ML suspension 5 mL, 5 mL, Oral, Q12H PRN, Athena Masse, MD, 5 mL at 10/01/22 2231   clopidogrel (PLAVIX) tablet 75 mg, 75 mg, Oral, Daily, Loletha Grayer, MD, 75 mg at 10/02/22 0941   feeding supplement (GLUCERNA SHAKE) (GLUCERNA SHAKE) liquid 237 mL, 237 mL, Oral, TID BM, Wieting, Richard, MD, 237 mL at 10/02/22 2002   ferrous sulfate tablet 325 mg, 325 mg, Oral, Q breakfast, Judd Gaudier V, MD, 325 mg at 10/02/22 [id number]   furosemide (LASIX) tablet 20 mg, 20 mg, Oral, Daily, Leslye Peer, Richard, MD, 20 mg at 10/02/22 0941   guaiFENesin-dextromethorphan (ROBITUSSIN DM) 100-10 MG/5ML syrup 10 mL, 10 mL, Oral, Q4H PRN, Athena Masse, MD, 10 mL at 10/03/22 0436   ipratropium (ATROVENT HFA) inhaler 2 puff, 2 puff, Inhalation, Q4H PRN, Darrick Penna, RPH, 2 puff at 09/28/22 1700   ipratropium-albuterol (DUONEB) 0.5-2.5 (3) MG/3ML nebulizer solution 3 mL, 3 mL, Nebulization, Q6H PRN, Shawna Clamp, MD, 3 mL at 10/02/22 2153   methocarbamol (ROBAXIN) tablet 500 mg, 500 mg, Oral, Q6H PRN, Athena Masse, MD   ondansetron (ZOFRAN) tablet 4 mg, 4 mg, Oral, Q6H PRN **OR** ondansetron (ZOFRAN) injection 4 mg, 4 mg, Intravenous, Q6H PRN, Athena Masse, MD   potassium chloride (KLOR-CON M) CR tablet 20 mEq, 20 mEq, Oral, Daily, Judd Gaudier V, MD, 20 mEq at 10/02/22 0941   predniSONE (DELTASONE) tablet 40 mg, 40 mg, Oral, Q breakfast, Shawna Clamp, MD, 40 mg at 10/02/22 1446   sodium chloride flush (NS) 0.9 % injection 10-40 mL, 10-40 mL, Intracatheter, Q12H, Wieting, Richard, MD, 10 mL at 10/02/22 2156   sodium chloride flush (NS) 0.9 % injection 10-40 mL, 10-40 mL, Intracatheter, PRN, Leslye Peer, Richard, MD   timolol (TIMOPTIC) 0.5 % ophthalmic solution 1 drop, 1 drop, Both Eyes, BID, Athena Masse, MD, 1 drop at 10/02/22 2154   traMADol (ULTRAM) tablet 50 mg, 50 mg,  Oral, Q8H PRN, Athena Masse, MD    ALLERGIES   Oxycodone, Prednisone, and Sulfa antibiotics     REVIEW OF SYSTEMS    Review of Systems:  Gen:  Denies  fever, sweats, chills weigh loss  HEENT: Denies blurred vision, double vision, ear pain, eye pain, hearing loss, nose bleeds, sore throat Cardiac:  No dizziness, chest pain or heaviness, chest tightness,edema Resp:   reports dyspnea chronically  Gi: Denies swallowing difficulty, stomach pain, nausea or vomiting, diarrhea, constipation, bowel incontinence Gu:  Denies bladder incontinence, burning urine Ext:   Denies Joint pain, stiffness or swelling Skin: Denies  skin rash, easy bruising or bleeding or hives Endoc:  Denies polyuria, polydipsia , polyphagia or weight change Psych:   Denies depression, insomnia or hallucinations   Other:  All other systems negative   VS: BP (!) 146/71 (BP Location: Right Arm)   Pulse 74   Temp 98.6 F (37 C)   Resp 20   Ht 5' (1.524 m)   Wt 58.6 kg   SpO2 93%   BMI 25.23 kg/m      PHYSICAL EXAM    GENERAL:NAD, no fevers, chills, no weakness no fatigue HEAD: Normocephalic, atraumatic.  EYES: Pupils equal, round, reactive to light. Extraocular muscles intact. No scleral icterus.  MOUTH: Moist mucosal membrane. Dentition intact. No abscess noted.  EAR, NOSE, THROAT: Clear without exudates. No external lesions.  NECK: Supple. No thyromegaly. No nodules. No JVD.  PULMONARY: decreased breath sounds with mild rhonchi worse at bases bilaterally.  CARDIOVASCULAR: S1 and S2. Regular rate and rhythm. No murmurs, rubs, or gallops. No edema. Pedal pulses 2+ bilaterally.  GASTROINTESTINAL: Soft, nontender, nondistended. No masses. Positive bowel sounds. No hepatosplenomegaly.  MUSCULOSKELETAL: No swelling, clubbing, or edema. Range of motion full in all extremities.  NEUROLOGIC: Cranial nerves II through XII are intact. No gross focal neurological deficits. Sensation intact. Reflexes intact.   SKIN: No ulceration, lesions, rashes, or cyanosis. Skin warm and dry. Turgor intact.  PSYCHIATRIC: Mood, affect within normal limits. The patient is awake, alert and oriented x 3. Insight, judgment intact.       IMAGING   ECHOCARDIOGRAM REPORT       Patient Name:   OLYVE STROBLE Date of Exam: 09/22/2022  Medical Rec #:  [id number]        Height:       64.0 in  Accession #:    [id number]       Weight:       133.4 lb  Date of Birth:  1926-05-27        BSA:          1.647 m  Patient Age:    57 years         BP:           147/67 mmHg  Patient Gender: F                HR:           76 bpm.  Exam Location:  [facility]   Procedure: 2D Echo, Cardiac Doppler and Color Doppler   Indications:     CHF    History:         Patient has prior history of Echocardiogram examinations,  most                  recent 05/28/2013. CHF, CAD, Prior CABG, COPD,                   Signs/Symptoms:Murmur; Risk Factors:Hypertension,  Diabetes and                   Dyslipidemia. Bilateral plerual effusion, COVID +.    Sonographer:     Wenda Low  Referring Phys:  [id number] Athena Masse  Diagnosing Phys: Ida Rogue MD   IMPRESSIONS     1. Left ventricular ejection fraction, by estimation, is 45 to 50%. The  left ventricle has mildly decreased function. The left ventricle  demonstrates global hypokinesis. There is mild left ventricular  hypertrophy. Left ventricular diastolic parameters  are consistent with Grade II diastolic dysfunction (pseudonormalization).  There is the interventricular septum is flattened in systole, consistent  with right ventricular pressure overload.   2. Right ventricular systolic function is severely  reduced. The right  ventricular size is normal. Mildly increased right ventricular wall  thickness. There is moderately elevated pulmonary artery systolic  pressure. The estimated right ventricular  systolic pressure is 123XX123 mmHg.   3. Left atrial size was mildly dilated.    4. Moderate pleural effusion in the left lateral region.   5. The mitral valve is normal in structure. Moderate mitral valve  regurgitation. No evidence of mitral stenosis. Moderate mitral annular  calcification.   6. Tricuspid valve regurgitation is moderate to severe.   7. The aortic valve is tricuspid. Aortic valve regurgitation is mild.  Aortic valve sclerosis is present, with no evidence of aortic valve  stenosis.   8. The inferior vena cava is dilated in size with >50% respiratory  variability, suggesting right atrial pressure of 8 mmHg.   FINDINGS   Left Ventricle: Left ventricular ejection fraction, by estimation, is 45  to 50%. The left ventricle has mildly decreased function. The left  ventricle demonstrates global hypokinesis. The left ventricular internal  cavity size was normal in size. There is   mild left ventricular hypertrophy. The interventricular septum is  flattened in systole, consistent with right ventricular pressure overload.  Left ventricular diastolic parameters are consistent with Grade II  diastolic dysfunction (pseudonormalization).   Right Ventricle: The right ventricular size is normal. Mildly increased  right ventricular wall thickness. Right ventricular systolic function is  severely reduced. There is moderately elevated pulmonary artery systolic  pressure. The tricuspid regurgitant  velocity is 3.08 m/s, and with an assumed right atrial pressure of 8 mmHg,  the estimated right ventricular systolic pressure is 123XX123 mmHg.   Left Atrium: Left atrial size was mildly dilated.   Right Atrium: Right atrial size was normal in size.   Pericardium: There is no evidence of pericardial effusion.   Mitral Valve: The mitral valve is normal in structure. There is mild  thickening of the mitral valve leaflet(s). Moderate mitral annular  calcification. Moderate mitral valve regurgitation. No evidence of mitral  valve stenosis. MV peak gradient, 10.4 mmHg.    The mean mitral valve gradient is 5.0 mmHg.   Tricuspid Valve: The tricuspid valve is normal in structure. Tricuspid  valve regurgitation is moderate to severe. No evidence of tricuspid  stenosis.   Aortic Valve: The aortic valve is tricuspid. Aortic valve regurgitation is  mild. Aortic valve sclerosis is present, with no evidence of aortic valve  stenosis. Aortic valve mean gradient measures 4.0 mmHg. Aortic valve peak  gradient measures 7.0 mmHg.  Aortic valve area, by VTI measures 2.03 cm.   Pulmonic Valve: The pulmonic valve was normal in structure. Pulmonic valve  regurgitation is mild. No evidence of pulmonic stenosis.   Aorta: The aortic root is normal in size and structure.   Venous: The inferior vena cava is dilated in size with greater than 50%  respiratory variability, suggesting right atrial pressure of 8 mmHg.   IAS/Shunts: No atrial level shunt detected by color flow Doppler.   Additional Comments: There is a moderate pleural effusion in the left  lateral region.     LEFT VENTRICLE  PLAX 2D  LVIDd:         4.30 cm   Diastology  LVIDs:         3.50 cm   LV e' medial:    9.90 cm/s  LV PW:         0.80 cm   LV E/e' medial:  14.2  LV IVS:  1.10 cm   LV e' lateral:   6.53 cm/s  LVOT diam:     2.00 cm   LV E/e' lateral: 21.6  LV SV:         58  LV SV Index:   35  LVOT Area:     3.14 cm     RIGHT VENTRICLE  RV Basal diam:  4.20 cm  RV Mid diam:    3.60 cm   LEFT ATRIUM             Index        RIGHT ATRIUM           Index  LA diam:        4.30 cm 2.61 cm/m   RA Area:     18.60 cm  LA Vol (A2C):   76.0 ml 46.14 ml/m  RA Volume:   55.00 ml  33.39 ml/m  LA Vol (A4C):   48.2 ml 29.26 ml/m  LA Biplane Vol: 61.4 ml 37.28 ml/m   AORTIC VALVE                    PULMONIC VALVE  AV Area (Vmax):    2.07 cm     PV Vmax:       0.83 m/s  AV Area (Vmean):   1.79 cm     PV Peak grad:  2.8 mmHg  AV Area (VTI):     2.03 cm  AV Vmax:           132.00 cm/s  AV  Vmean:          90.400 cm/s  AV VTI:            0.285 m  AV Peak Grad:      7.0 mmHg  AV Mean Grad:      4.0 mmHg  LVOT Vmax:         87.00 cm/s  LVOT Vmean:        51.400 cm/s  LVOT VTI:          0.184 m  LVOT/AV VTI ratio: 0.65    AORTA  Ao Root diam: 2.90 cm  Ao Asc diam:  2.70 cm   MITRAL VALVE                TRICUSPID VALVE  MV Area (PHT): 2.66 cm     TR Peak grad:   37.9 mmHg  MV Area VTI:   1.35 cm     TR Vmax:        308.00 cm/s  MV Peak grad:  10.4 mmHg  MV Mean grad:  5.0 mmHg     SHUNTS  MV Vmax:       1.62 m/s     Systemic VTI:  0.18 m  MV Vmean:      102.0 cm/s   Systemic Diam: 2.00 cm  MV Decel Time: 285 msec  MV E velocity: 141.00 cm/s  MV A velocity: 98.00 cm/s  MV E/A ratio:  1.44   Ida Rogue MD  Electronically signed by Ida Rogue MD  Signature Date/Time: 09/22/2022/5:21:19 PM        Final     ASSESSMENT/PLAN    Acute COVID19 LRTI -Does not require Remdesevir antiviral - pharmacy protocol 5 d -Diuresis - Agree with lasix 20 po, patient with hypotension I have dcd metoprolol for now- monitor UOP - utilize external urinary catheter if possible -encourage to use IS and Acapella device for bronchopulmonary  hygiene when able -consider monitoring -PT/OT when possible -due to presyncopal symptoms and elevated cardiac biomarkers would like to review VQ scan and DVT study to rule out pulmonary venous thromboembolism -patient shares she's feels close to baseline and may be evaluated on outpatient if above studies are unremarkable.  -noted TTE done 09/22/22             Thank you for allowing me to participate in the care of this patient.   Patient/Family are satisfied with care plan and all questions have been answered.    Provider disclosure: Patient with at least one acute or chronic illness or injury that poses a threat to life or bodily function and is being managed actively during this encounter.  All of the below services have been  performed independently by signing provider:  review of prior documentation from internal and or external health records.  Review of previous and current lab results.  Interview and comprehensive assessment during patient visit today. Review of current and previous chest radiographs/CT scans. Discussion of management and test interpretation with health care team and patient/family.   This document was prepared using Dragon voice recognition software and may include unintentional dictation errors.     Ottie Glazier, M.D.  Division of Pulmonary & Critical Care Medicine

## 2022-10-03 NOTE — Progress Notes (Signed)
Pt picked up by ems, reviewed paperwork, vs checked, pt remains stable on room air, daughter [name] called and notified of pt's pick up by ems.

## 2022-10-03 NOTE — Progress Notes (Signed)
Report called to [place] at [facility], family aware of the transfer, no concerns noted.

## 2022-10-03 NOTE — TOC Transition Note (Signed)
Transition of Care [facility]) - CM/SW Discharge Note   Patient Details  Name: Gabriela [name] MRN: [id number] Date of Birth: [birth year]  [facility] CM/SW Contact:  Tiburcio Bash, LCSW Phone Number: 10/03/2022, 1:40 PM   Clinical Narrative:     Patient will DC to: [facility] Anticipated DC date: 10/03/22 Family notified: [name] Transport by: Johnanna Schneiders  Per MD patient ready for DC to [facility] . RN, patient, patient's family, and facility notified of DC. Discharge Summary sent to facility. RN given number for report [phone]. DC packet on chart. Ambulance transport requested for patient.  CSW signing off.  Pricilla Riffle, LCSW   Final next level of care: Skilled Nursing Facility Barriers to Discharge: No Barriers Identified   Patient Goals and CMS Choice CMS Medicare.gov Compare Post Acute Care list provided to:: Patient Choice offered to / list presented to : Patient  Discharge Placement                         Discharge Plan and Services Additional resources added to the After Visit Summary for                              [place]: [facility]        Social Determinants of Health (SDOH) Interventions SDOH Screenings   Food Insecurity: No Food Insecurity (10/01/2022)  Housing: Low Risk  (10/01/2022)  Transportation Needs: No Transportation Needs (10/01/2022)  Utilities: Not At Risk (10/01/2022)  Alcohol Screen: Low Risk  (09/12/2022)  Depression (PHQ2-9): Low Risk  (09/12/2022)  Financial Resource Strain: Low Risk  (09/07/2021)  Physical Activity: Inactive (09/07/2021)  Social Connections: Moderately Isolated (09/07/2021)  Stress: No Stress Concern Present (09/07/2021)  Tobacco Use: Low Risk  (09/21/2022)     Readmission Risk Interventions     No data to display

## 2022-10-03 NOTE — Discharge Summary (Addendum)
Physician Discharge Summary   [name] MRN: [id number] DOB: 09-01-1925  Admit date:     09/21/2022  Discharge date: 10/03/22  Discharge Physician: Sharen Hones   PCP: [name], MD   Recommendations at discharge:   Follow-up with PCP in 1 week. Follow-up with neurology in 1 month Check a CBC and BMP in 5 to 7 days.  Discharge Diagnoses: Principal Problem:   Acute hypoxic respiratory failure (HCC) Active Problems:   Acute stroke due to ischemia (HCC)   Chronic systolic CHF (congestive heart failure) (HCC)   COVID-19   Acute renal failure superimposed on stage 3a chronic kidney disease (HCC)   Bilateral pleural effusion   Anemia of chronic disease   Acute metabolic encephalopathy   Thrombocytopenia (HCC)   Generalized weakness   Essential (primary) hypertension   Arthritis, degenerative   MGUS (monoclonal gammopathy of unknown significance)   Type 2 diabetes mellitus with circulatory disorder (HCC)   Myocardial injury   Hypernatremia Acute kidney injury ruled out.  Patient has a chronic kidney disease stage IIIa. Resolved Problems:   * No resolved hospital problems. *  Hospital Course: 89 y.o. female with medical history significant for HTN, CAD, CKD 3a, DM, MGUS who presents to the ED by EMS with concerns for 3-day history of weakness, confusion, slurred speech and dropping things.  Most of the history is given by the granddaughter at the bedside who stated that she noticed that her grandmother has increasingly worsening swelling of the bilateral lower extremities up to the thighs in spite of taking her lasix 10 mg daily.  She has no prior history of CHF and states she was prescribed Lasix by her PCP.  She took her to the nephrologist on 2/1 changed to Lasix to torsemide and ordered compression socks.  She stated that since the medication was changed, her grandmother continue to have swelling and seem to be weaker and she also developed a cough that  has become increasingly more congested.  She is not short of breath but at baseline she does not move around a lot.  She has had no fever or chills, vomiting or diarrhea.  She denies chest pain. ED course and data review: Afebrile with unremarkable vitals. Labs significant for troponin of 60 and BNP 4115.  WBC 11,500 with hemoglobin 8.8, platelets 51,000.  Creatinine 1.57 above baseline of 0.95 a month ago respiratory viral panel positive for COVID.  EKG, personally viewed and interpreted showing sinus at 77 with T wave inversion in lateral leads.  Chest x-ray nonacute and shows atrophy and chronic small vessel ischemic changes.  Chest x-ray showing bilateral pleural effusions with basilar atelectasis. MRI ordered to evaluate for stroke.  Hospitalist consulted for admission.   MRI showed a 7 mm acute ischemic nonhemorrhagic right cerebral infarct.   2/9.  Called with low sugar.  Given an amp of D50 and started on IV fluids with D5 minute.  I do not think her fingersticks are accurate secondary to her hands being cold and poor circulation.  Chemistries fingerstick this morning was 118 and 12 noon sugar was 132.  Will discontinue fingersticks and recheck with chemistry on a daily basis. 2/10.  Patient still with cough and some shortness of breath. 2/11.  Patient with cough and wheezing and hypoxia with nursing staff.  Ordered nebulizer treatments and 1 dose of prednisone and 1 dose of Lasix.  Patient condition has improved, off oxygen, VQ scan showed low probability of PE.  Condition had  improved with, medically stable to be discharged.  Continue 3 more days of prednisone. Assessment and Plan: Acute hypoxic respiratory failure: Acute bronchitis secondary to COVID-19 infection. COVID-19 infection. Patient condition has improved, off oxygen.  Still has a cough, occasionally shortness of breath.  Continue 3 more days of oral prednisone.  Acute stroke due to ischemia: Right cerebellar area.  Continue  Plavix.  As per the Neurology benefits of statins are outweighed by the risks of liver damage and myopathy.   Chronic systolic CHF: Hypernatremia. Last LVEF 45%.   Appears euvolemic. Patient developed hypernatremia after oral Lasix, will discontinue Lasix for now.  May restart in the future if becomes volume overloaded.   Bilateral pleural effusion: Continue to monitor, shortness of breath has improved.   CKD stage IIIa: Acute kidney injury ruled out. Function stable, patient does not meet criteria for AKI.   Acute metabolic encephalopathy: Resolved.   Generalized weakness: Continue PT/OT, transfer to rehab.   Elevated troponin: Troponin slightly elevated but we can get this with a stroke also.   Type 2 diabetes mellitus: Hb A1c only 6.8.   MGUS (monoclonal gammopathy of unknown significance) Anemia and thrombocytopenia: Follow-up with PCP as outpatient.   Osteoarthritis: Continue home medicines.   Essential hypertension: Continue metoprolol.  Pressure ulcer POA. Follow with RN  Pressure Injury 06/04/22 Heel Right Deep Tissue Pressure Injury - Purple or maroon localized area of discolored intact skin or blood-filled blister due to damage of underlying soft tissue from pressure and/or shear. purple area to right heel. (Active)  06/04/22 2030  Location: Heel  Location Orientation: Right  Staging: Deep Tissue Pressure Injury - Purple or maroon localized area of discolored intact skin or blood-filled blister due to damage of underlying soft tissue from pressure and/or shear.  Wound Description (Comments): purple area to right heel.  Present on Admission: No           Consultants: Neurology, pulmonology Procedures performed: None  Disposition: Skilled nursing facility Diet recommendation:  Discharge Diet Orders (From admission, onward)     Start     Ordered   10/03/22 0000  Diet general       Comments: Dys 3 diet   10/03/22 1225   09/30/22 0000  Diet - low  sodium heart healthy        09/30/22 1101   09/30/22 0000  Diet Carb Modified        09/30/22 1101           Dysphagia type 3 thin Liquid DISCHARGE MEDICATION: Allergies as of 10/03/2022       Reactions   Oxycodone Other (See Comments)   Patient cannot tolerate more than 2 doses.   Prednisone Other (See Comments)   Patient cannot tolerate more than 2 doses   Sulfa Antibiotics Rash        Medication List     STOP taking these medications    albuterol 108 (90 Base) MCG/ACT inhaler Commonly known as: VENTOLIN HFA   Aspirin Low Dose 81 MG tablet Generic drug: aspirin EC   furosemide 20 MG tablet Commonly known as: LASIX   polyethylene glycol powder 17 GM/SCOOP powder Commonly known as: GLYCOLAX/MIRALAX   sodium bicarbonate 650 MG tablet       TAKE these medications    acetaminophen 325 MG tablet Commonly known as: TYLENOL Take 2 tablets (650 mg total) by mouth 3 (three) times daily. Wean as able   Calcium Antacid 500 MG chewable tablet Generic drug: calcium carbonate  Chew 2 tablets (400 mg of elemental calcium total) by mouth daily.   clopidogrel 75 MG tablet Commonly known as: PLAVIX Take 1 tablet (75 mg total) by mouth daily.   diclofenac Sodium 1 % Gel Commonly known as: VOLTAREN Apply 2 g topically 4 (four) times daily.   ferrous sulfate 325 (65 FE) MG tablet Take 1 tablet (325 mg total) by mouth daily with breakfast.   ipratropium 17 MCG/ACT inhaler Commonly known as: ATROVENT HFA Inhale 2 puffs into the lungs every 4 (four) hours as needed for wheezing.   ipratropium-albuterol 0.5-2.5 (3) MG/3ML Soln Commonly known as: DUONEB Take 3 mLs by nebulization every 4 (four) hours as needed.   loratadine 10 MG tablet Commonly known as: CLARITIN TAKE 1 TABLET BY MOUTH EVERY DAY   methocarbamol 500 MG tablet Commonly known as: ROBAXIN Take 1 tablet (500 mg total) by mouth every 6 (six) hours as needed for muscle spasms.   metoprolol succinate  25 MG 24 hr tablet Commonly known as: TOPROL-XL Take 0.5 tablets (12.5 mg total) by mouth daily.   multivitamin with minerals Tabs tablet Take 1 tablet by mouth daily.   potassium chloride 10 MEQ tablet Commonly known as: KLOR-CON M Take 2 tablets (20 mEq total) by mouth daily.   predniSONE 20 MG tablet Commonly known as: DELTASONE Take 2 tablets (40 mg total) by mouth daily with breakfast for 3 days. Start taking on: October 04, 2022   Simbrinza 1-0.2 % Susp Generic drug: Brinzolamide-Brimonidine Place 1 drop into both eyes 2 (two) times daily.   timolol 0.5 % ophthalmic solution Commonly known as: TIMOPTIC Place 1 drop into both eyes 2 (two) times daily.   traMADol 50 MG tablet Commonly known as: ULTRAM Take 1 tablet (50 mg total) by mouth every 8 (eight) hours as needed.   Vitamin D (Ergocalciferol) 1.25 MG (50000 UNIT) Caps capsule Commonly known as: DRISDOL Take 1 capsule (50,000 Units total) by mouth every 7 (seven) days.               Discharge Care Instructions  (From admission, onward)           Start     Ordered   10/03/22 0000  Discharge wound care:       Comments: Follow with RN   10/03/22 1225   09/30/22 0000  Discharge wound care:       Comments: Follow up wound care in [facility]   09/30/22 1101            Follow-up Information     Simmons-Robinson, Makiera, MD Follow up in 1 week(s).   Specialty: Family Medicine Contact information: [address] [place] [address] [facility] Follow up in 1 month(s).   Contact information: [place] [phone]               Discharge Exam: Filed Weights   10/01/22 0958 10/01/22 2249 10/03/22 0438  Weight: 57.7 kg 57.7 kg 58.6 kg   General exam: Appears calm and comfortable  Respiratory system: Clear to auscultation. Respiratory effort normal. Cardiovascular system:  S1 & S2 heard, RRR. No JVD, murmurs, rubs, gallops or clicks. No pedal edema. Gastrointestinal system: Abdomen is nondistended, soft and nontender. No organomegaly or masses felt. Normal bowel sounds heard. Central nervous system: Alert and oriented x3. No focal neurological deficits. Extremities: Symmetric 5 x 5 power. Skin: No  rashes, lesions or ulcers Psychiatry: Mood & affect appropriate.    Condition at discharge: fair  The results of significant diagnostics from this hospitalization (including imaging, microbiology, ancillary and laboratory) are listed below for reference.   Imaging Studies: NM Pulmonary Perfusion  Result Date: 10/03/2022 CLINICAL DATA:  COVID positive, lightheadedness and altered mental status. EXAM: NUCLEAR MEDICINE PERFUSION LUNG SCAN TECHNIQUE: Perfusion images were obtained in multiple projections after intravenous injection of radiopharmaceutical. Ventilation scans intentionally deferred if perfusion scan and chest x-ray adequate for interpretation during COVID 19 epidemic. RADIOPHARMACEUTICALS:  4.6 mCi Tc-69mMAA IV COMPARISON:  Chest x-ray earlier today. FINDINGS: Decreased perfusion of the inferior aspect of the left lower lobe likely is on the basis of pleural fluid and atelectasis seen by chest x-ray. No other significant perfusion defects identified by nuclear medicine imaging to suggest significant pulmonary embolism. IMPRESSION: Low likelihood of significant pulmonary embolism based on nuclear medicine imaging. Perfusion defect in the inferior left lower lobe is likely on the basis of pleural effusion and atelectasis as seen by chest x-ray. Electronically Signed   By: GAletta EdouardM.D.   On: 10/03/2022 10:08   DG Chest 2 View  Result Date: 10/03/2022 CLINICAL DATA:  Shortness of breath EXAM: CHEST - 2 VIEW COMPARISON:  09/30/2022 FINDINGS: Enlargement of cardiac silhouette post CABG and MVR. Mild pulmonary vascular congestion. Atherosclerotic calcifications  aorta. Bibasilar effusions and atelectasis. Mild perihilar infiltrates likely pulmonary edema. No pneumothorax. Diffuse osseous demineralization. IMPRESSION: Enlargement of cardiac silhouette with pulmonary vascular congestion and probable mild pulmonary edema. Bibasilar pleural effusions and atelectasis. Aortic Atherosclerosis (ICD10-I70.0). Electronically Signed   By: MLavonia DanaM.D.   On: 10/03/2022 09:10   UKoreaVenous Img Lower Bilateral (DVT)  Result Date: 10/02/2022 CLINICAL DATA:  Lower extremity pain and swelling EXAM: BILATERAL LOWER EXTREMITY VENOUS DOPPLER ULTRASOUND TECHNIQUE: Gray-scale sonography with graded compression, as well as color Doppler and duplex ultrasound were performed to evaluate the lower extremity deep venous systems from the level of the common femoral vein and including the common femoral, femoral, profunda femoral, popliteal and calf veins including the posterior tibial, peroneal and gastrocnemius veins when visible. The superficial great saphenous vein was also interrogated. Spectral Doppler was utilized to evaluate flow at rest and with distal augmentation maneuvers in the common femoral, femoral and popliteal veins. COMPARISON:  None Available. FINDINGS: RIGHT LOWER EXTREMITY Common Femoral Vein: No evidence of thrombus. Normal compressibility, respiratory phasicity and response to augmentation. Saphenofemoral Junction: No evidence of thrombus. Normal compressibility and flow on color Doppler imaging. Profunda Femoral Vein: No evidence of thrombus. Normal compressibility and flow on color Doppler imaging. Femoral Vein: No evidence of thrombus. Normal compressibility, respiratory phasicity and response to augmentation. Popliteal Vein: No evidence of thrombus. Normal compressibility, respiratory phasicity and response to augmentation. Calf Veins: Not well visualized due to poor patient positioning. Superficial Great Saphenous Vein: No evidence of thrombus. Normal compressibility.  Venous Reflux:  None. Other Findings:  Calf edema is noted. LEFT LOWER EXTREMITY Common Femoral Vein: No evidence of thrombus. Normal compressibility, respiratory phasicity and response to augmentation. Saphenofemoral Junction: No evidence of thrombus. Normal compressibility and flow on color Doppler imaging. Profunda Femoral Vein: No evidence of thrombus. Normal compressibility and flow on color Doppler imaging. Femoral Vein: No evidence of thrombus. Normal compressibility, respiratory phasicity and response to augmentation. Popliteal Vein: No evidence of thrombus. Normal compressibility, respiratory phasicity and response to augmentation. Calf Veins: Not well visualized due to poor patient positioning Superficial Great Saphenous Vein:  No evidence of thrombus. Normal compressibility. Venous Reflux:  None. Other Findings:  Calf edema is noted. IMPRESSION: No evidence of deep venous thrombosis in either lower extremity. Electronically Signed   By: Inez Catalina M.D.   On: 10/02/2022 17:02   DG Chest Port 1 View  Result Date: 09/30/2022 CLINICAL DATA:  Cough.  Edema EXAM: PORTABLE CHEST 1 VIEW COMPARISON:  09/25/2022 FINDINGS: Status post median sternotomy. Enlarged heart with calcified aorta. Bilateral small pleural effusions with adjacent opacities. Vascular congestion. No pneumothorax. Overlapping cardiac leads IMPRESSION: Postop chest with enlarged cardiopericardial silhouette. Bilateral small pleural effusions with adjacent opacities and vascular congestion Electronically Signed   By: Jill Side M.D.   On: 09/30/2022 17:42   DG Chest Port 1 View  Result Date: 09/25/2022 CLINICAL DATA:  Cough. EXAM: PORTABLE CHEST 1 VIEW COMPARISON:  09/23/2022 FINDINGS: The cardio pericardial silhouette is enlarged. Bibasilar collapse/consolidation is associated with small to moderate bilateral pleural effusions. Prominent skin fold noted over the left lung. Status post cardiac valve replacement and CABG. Telemetry leads  overlie the chest. IMPRESSION: Bibasilar collapse/consolidation with small to moderate bilateral pleural effusions. Electronically Signed   By: Misty Stanley M.D.   On: 09/25/2022 08:26   US Carotid Bilateral  Result Date: 09/23/2022 CLINICAL DATA:  Stroke. EXAM: BILATERAL CAROTID DUPLEX ULTRASOUND TECHNIQUE: Pearline Cables scale imaging, color Doppler and duplex ultrasound were performed of bilateral carotid and vertebral arteries in the neck. COMPARISON:  None Available. FINDINGS: Criteria: Quantification of carotid stenosis is based on velocity parameters that correlate the residual internal carotid diameter with NASCET-based stenosis levels, using the diameter of the distal internal carotid lumen as the denominator for stenosis measurement. The following velocity measurements were obtained: RIGHT ICA: 58/12 cm/sec CCA: [id number] cm/sec SYSTOLIC ICA/CCA RATIO:  1.1 ECA: 82 cm/sec LEFT ICA: 62/17 cm/sec CCA: [id number] cm/sec SYSTOLIC ICA/CCA RATIO:  1.2 ECA: 27 cm/sec RIGHT CAROTID ARTERY: Echogenic plaque at the right carotid bulb. External carotid artery is patent with normal waveform. Normal waveforms and velocities in the internal carotid artery. RIGHT VERTEBRAL ARTERY: Antegrade flow and normal waveform in the right vertebral artery. LEFT CAROTID ARTERY: Small amount of plaque at the left carotid bulb. External carotid artery is patent with normal waveform. Shadowing plaque in the proximal internal carotid artery. Normal waveforms and velocities in the internal carotid artery. LEFT VERTEBRAL ARTERY: Antegrade flow and normal waveform in the left vertebral artery. IMPRESSION: 1. Atherosclerotic plaque involving bilateral carotid arteries. Estimated degree of stenosis in the internal carotid arteries is less than 50% bilaterally. 2. Patent vertebral arteries with antegrade flow. Electronically Signed   By: Markus Daft M.D.   On: 09/23/2022 15:37   MR ANGIO HEAD WO CONTRAST  Result Date: 09/23/2022 CLINICAL DATA:  Stroke,  follow-up. EXAM: MRA HEAD WITHOUT CONTRAST TECHNIQUE: Angiographic images of the Circle of Willis were acquired using MRA technique without intravenous contrast. COMPARISON:  MRI brain 09/21/2022.  CT head 09/21/2022. FINDINGS: Anterior circulation: Intracranial ICAs are patent without stenosis or aneurysm. The proximal ACAs and MCAs are patent without stenosis or aneurysm. Distal branches are symmetric. Posterior circulation: Normal basilar artery. The SCAs, AICAs and PICAs are patent proximally. The PCAs are patent proximally without stenosis or aneurysm. Distal branches are symmetric. Anatomic variants: Azygous A2 segment of the ACAs. Other: None. IMPRESSION: No large vessel occlusion or hemodynamically significant stenosis in the head. Electronically Signed   By: Emmit Alexanders M.D.   On: 09/23/2022 15:17   DG Chest [facility] 1 View  Result  Date: 09/23/2022 CLINICAL DATA:  Lethargy.  Worsening shortness of breath EXAM: PORTABLE CHEST 1 VIEW COMPARISON:  09/21/2022 FINDINGS: Increased densities in the right perihilar region. Persistent densities at the lung bases probably represent atelectasis and pleural fluid. Postoperative changes involving the heart. Heart size upper limits of normal but stable. Atherosclerotic calcifications at the aortic arch. Negative for a pneumothorax. IMPRESSION: 1. Increased densities in the right perihilar region. Findings could represent asymmetric edema versus infection. 2. Persistent bibasilar chest densities probably represent atelectasis and pleural effusions. Electronically Signed   By: Markus Daft M.D.   On: 09/23/2022 08:51   ECHOCARDIOGRAM COMPLETE  Result Date: 09/22/2022    ECHOCARDIOGRAM REPORT   Patient Name:   RECHEL KAPSNER Date of Exam: 09/22/2022 Medical Rec #:  [id number]        Height:       64.0 in Accession #:    [id number]       Weight:       133.4 lb Date of Birth:  1926-01-01        BSA:          1.647 m Patient Age:    50 years         BP:           147/67  mmHg Patient Gender: F                HR:           76 bpm. Exam Location:  [facility] Procedure: 2D Echo, Cardiac Doppler and Color Doppler Indications:     CHF  History:         Patient has prior history of Echocardiogram examinations, most                  recent 05/28/2013. CHF, CAD, Prior CABG, COPD,                  Signs/Symptoms:Murmur; Risk Factors:Hypertension, Diabetes and                  Dyslipidemia. Bilateral plerual effusion, COVID +.  Sonographer:     Wenda Low Referring Phys:  [id number] Athena Masse Diagnosing Phys: Ida Rogue MD IMPRESSIONS  1. Left ventricular ejection fraction, by estimation, is 45 to 50%. The left ventricle has mildly decreased function. The left ventricle demonstrates global hypokinesis. There is mild left ventricular hypertrophy. Left ventricular diastolic parameters are consistent with Grade II diastolic dysfunction (pseudonormalization). There is the interventricular septum is flattened in systole, consistent with right ventricular pressure overload.  2. Right ventricular systolic function is severely reduced. The right ventricular size is normal. Mildly increased right ventricular wall thickness. There is moderately elevated pulmonary artery systolic pressure. The estimated right ventricular systolic pressure is 123XX123 mmHg.  3. Left atrial size was mildly dilated.  4. Moderate pleural effusion in the left lateral region.  5. The mitral valve is normal in structure. Moderate mitral valve regurgitation. No evidence of mitral stenosis. Moderate mitral annular calcification.  6. Tricuspid valve regurgitation is moderate to severe.  7. The aortic valve is tricuspid. Aortic valve regurgitation is mild. Aortic valve sclerosis is present, with no evidence of aortic valve stenosis.  8. The inferior vena cava is dilated in size with >50% respiratory variability, suggesting right atrial pressure of 8 mmHg. FINDINGS  Left Ventricle: Left ventricular ejection fraction, by  estimation, is 45 to 50%. The left ventricle has mildly decreased function. The left ventricle demonstrates global hypokinesis. The  left ventricular internal cavity size was normal in size. There is  mild left ventricular hypertrophy. The interventricular septum is flattened in systole, consistent with right ventricular pressure overload. Left ventricular diastolic parameters are consistent with Grade II diastolic dysfunction (pseudonormalization). Right Ventricle: The right ventricular size is normal. Mildly increased right ventricular wall thickness. Right ventricular systolic function is severely reduced. There is moderately elevated pulmonary artery systolic pressure. The tricuspid regurgitant velocity is 3.08 m/s, and with an assumed right atrial pressure of 8 mmHg, the estimated right ventricular systolic pressure is 123XX123 mmHg. Left Atrium: Left atrial size was mildly dilated. Right Atrium: Right atrial size was normal in size. Pericardium: There is no evidence of pericardial effusion. Mitral Valve: The mitral valve is normal in structure. There is mild thickening of the mitral valve leaflet(s). Moderate mitral annular calcification. Moderate mitral valve regurgitation. No evidence of mitral valve stenosis. MV peak gradient, 10.4 mmHg.  The mean mitral valve gradient is 5.0 mmHg. Tricuspid Valve: The tricuspid valve is normal in structure. Tricuspid valve regurgitation is moderate to severe. No evidence of tricuspid stenosis. Aortic Valve: The aortic valve is tricuspid. Aortic valve regurgitation is mild. Aortic valve sclerosis is present, with no evidence of aortic valve stenosis. Aortic valve mean gradient measures 4.0 mmHg. Aortic valve peak gradient measures 7.0 mmHg. Aortic valve area, by VTI measures 2.03 cm. Pulmonic Valve: The pulmonic valve was normal in structure. Pulmonic valve regurgitation is mild. No evidence of pulmonic stenosis. Aorta: The aortic root is normal in size and structure. Venous:  The inferior vena cava is dilated in size with greater than 50% respiratory variability, suggesting right atrial pressure of 8 mmHg. IAS/Shunts: No atrial level shunt detected by color flow Doppler. Additional Comments: There is a moderate pleural effusion in the left lateral region.  LEFT VENTRICLE PLAX 2D LVIDd:         4.30 cm   Diastology LVIDs:         3.50 cm   LV e' medial:    9.90 cm/s LV PW:         0.80 cm   LV E/e' medial:  14.2 LV IVS:        1.10 cm   LV e' lateral:   6.53 cm/s LVOT diam:     2.00 cm   LV E/e' lateral: 21.6 LV SV:         58 LV SV Index:   35 LVOT Area:     3.14 cm  RIGHT VENTRICLE RV Basal diam:  4.20 cm RV Mid diam:    3.60 cm LEFT ATRIUM             Index        RIGHT ATRIUM           Index LA diam:        4.30 cm 2.61 cm/m   RA Area:     18.60 cm LA Vol (A2C):   76.0 ml 46.14 ml/m  RA Volume:   55.00 ml  33.39 ml/m LA Vol (A4C):   48.2 ml 29.26 ml/m LA Biplane Vol: 61.4 ml 37.28 ml/m  AORTIC VALVE                    PULMONIC VALVE AV Area (Vmax):    2.07 cm     PV Vmax:       0.83 m/s AV Area (Vmean):   1.79 cm     PV Peak grad:  2.8 mmHg  AV Area (VTI):     2.03 cm AV Vmax:           132.00 cm/s AV Vmean:          90.400 cm/s AV VTI:            0.285 m AV Peak Grad:      7.0 mmHg AV Mean Grad:      4.0 mmHg LVOT Vmax:         87.00 cm/s LVOT Vmean:        51.400 cm/s LVOT VTI:          0.184 m LVOT/AV VTI ratio: 0.65  AORTA Ao Root diam: 2.90 cm Ao Asc diam:  2.70 cm MITRAL VALVE                TRICUSPID VALVE MV Area (PHT): 2.66 cm     TR Peak grad:   37.9 mmHg MV Area VTI:   1.35 cm     TR Vmax:        308.00 cm/s MV Peak grad:  10.4 mmHg MV Mean grad:  5.0 mmHg     SHUNTS MV Vmax:       1.62 m/s     Systemic VTI:  0.18 m MV Vmean:      102.0 cm/s   Systemic Diam: 2.00 cm MV Decel Time: 285 msec MV E velocity: 141.00 cm/s MV A velocity: 98.00 cm/s MV E/A ratio:  1.44 Ida Rogue MD Electronically signed by Ida Rogue MD Signature Date/Time: 09/22/2022/5:21:19 PM     Final    MR BRAIN WO CONTRAST  Result Date: 09/21/2022 CLINICAL DATA:  Initial evaluation for neuro deficit, stroke suspected. EXAM: MRI HEAD WITHOUT CONTRAST TECHNIQUE: Multiplanar, multiecho pulse sequences of the brain and surrounding structures were obtained without intravenous contrast. COMPARISON:  CT from earlier the same day. FINDINGS: Brain: Generalized age-related cerebral atrophy. Patchy and confluent T2/FLAIR hyperintensity involving the periventricular white matter, most characteristic of chronic microvascular ischemic disease, mild-to-moderate in nature. 7 mm focus of restricted diffusion involving the right cerebellum, consistent with a small acute ischemic nonhemorrhagic infarct (series 5, image 13). No associated mass effect. No other evidence for acute or subacute ischemia. Gray-white matter differentiation otherwise maintained. No acute intracranial hemorrhage. Few punctate chronic micro hemorrhages noted within the right cerebral hemisphere, likely small vessel related. No mass lesion, midline shift or mass effect no hydrocephalus or extra-axial fluid collection. Pituitary gland and suprasellar region within normal limits. Vascular: Major intracranial vascular flow voids are maintained. Skull and upper cervical spine: Craniocervical junction within normal limits. Bone marrow signal intensity grossly within normal limits. No scalp soft tissue abnormality. Sinuses/Orbits: Prior bilateral ocular lens replacement. Chronic mucosal thickening present about the ethmoidal air cells and maxillary sinuses. No mastoid effusion. Other: None. IMPRESSION: 1. 7 mm acute ischemic nonhemorrhagic right cerebellar infarct. 2. Underlying age-related cerebral atrophy with mild to moderate chronic microvascular ischemic disease. Electronically Signed   By: Jeannine Boga M.D.   On: 09/21/2022 23:32   CT HEAD WO CONTRAST  Result Date: 09/21/2022 CLINICAL DATA:  Neuro deficit, acute, stroke suspected.  Slurred speech. Weakness. EXAM: CT HEAD WITHOUT CONTRAST TECHNIQUE: Contiguous axial images were obtained from the base of the skull through the vertex without intravenous contrast. RADIATION DOSE REDUCTION: This exam was performed according to the departmental dose-optimization program which includes automated exposure control, adjustment of the mA and/or kV according to patient size and/or use of iterative reconstruction technique. COMPARISON:  02/10/2021 FINDINGS:  Brain: Age related atrophy. Chronic small-vessel ischemic changes of the hemispheric white matter. No sign of acute infarction, mass lesion, hemorrhage, hydrocephalus or extra-axial collection. Vascular: There is atherosclerotic calcification of the major vessels at the base of the brain. Skull: Negative Sinuses/Orbits: Clear/normal Other: None IMPRESSION: No acute CT finding. Age related atrophy and chronic small-vessel ischemic changes of the white matter. Electronically Signed   By: Nelson Chimes M.D.   On: 09/21/2022 19:57   DG Chest 2 View  Result Date: 09/21/2022 CLINICAL DATA:  Cough. Weakness. Altered mental status. History of diabetes, hypertension, coronary bypass. EXAM: CHEST - 2 VIEW COMPARISON:  06/26/2022 FINDINGS: Postoperative changes in the mediastinum. Mild cardiac enlargement. No vascular congestion. Moderate bilateral pleural effusions with basilar atelectasis. No pneumothorax. Mediastinal contours appear intact. Calcification of the aorta. Degenerative changes in the spine and shoulders. IMPRESSION: Bilateral pleural effusions with basilar atelectasis. Electronically Signed   By: Lucienne Capers M.D.   On: 09/21/2022 19:56    Microbiology: Results for orders placed or performed during the hospital encounter of 09/21/22  Resp panel by RT-PCR (RSV, Flu A&B, Covid) Anterior Nasal Swab     Status: Abnormal   Collection Time: 09/21/22  8:33 PM   Specimen: Anterior Nasal Swab  Result Value Ref Range Status   SARS Coronavirus 2  by RT PCR POSITIVE (A) NEGATIVE Final    Comment: (NOTE) SARS-CoV-2 target nucleic acids are DETECTED.  The SARS-CoV-2 RNA is generally detectable in upper respiratory specimens during the acute phase of infection. Positive results are indicative of the presence of the identified virus, but do not rule out bacterial infection or co-infection with other pathogens not detected by the test. Clinical correlation with patient history and other diagnostic information is necessary to determine patient infection status. The expected result is Negative.  Fact Sheet for Patients: EntrepreneurPulse.com.au  Fact Sheet for Healthcare Providers: IncredibleEmployment.be  This test is not yet approved or cleared by the [place] FDA and  has been authorized for detection and/or diagnosis of SARS-CoV-2 by FDA under an Emergency Use Authorization (EUA).  This EUA will remain in effect (meaning this test can be used) for the duration of  the COVID-19 declaration under Section 564(b)(1) of the A ct, 21 U.S.C. section 360bbb-3(b)(1), unless the authorization is terminated or revoked sooner.     Influenza A by PCR NEGATIVE NEGATIVE Final   Influenza B by PCR NEGATIVE NEGATIVE Final    Comment: (NOTE) The Xpert Xpress SARS-CoV-2/FLU/RSV plus assay is intended as an aid in the diagnosis of influenza from Nasopharyngeal swab specimens and should not be used as a sole basis for treatment. Nasal washings and aspirates are unacceptable for Xpert Xpress SARS-CoV-2/FLU/RSV testing.  Fact Sheet for Patients: EntrepreneurPulse.com.au  Fact Sheet for Healthcare Providers: IncredibleEmployment.be  This test is not yet approved or cleared by the [place] FDA and has been authorized for detection and/or diagnosis of SARS-CoV-2 by FDA under an Emergency Use Authorization (EUA). This EUA will remain in effect (meaning this test can  be used) for the duration of the COVID-19 declaration under Section 564(b)(1) of the Act, 21 U.S.C. section 360bbb-3(b)(1), unless the authorization is terminated or revoked.     Resp Syncytial Virus by PCR NEGATIVE NEGATIVE Final    Comment: (NOTE) Fact Sheet for Patients: EntrepreneurPulse.com.au  Fact Sheet for Healthcare Providers: IncredibleEmployment.be  This test is not yet approved or cleared by the [place] FDA and has been authorized for detection and/or diagnosis of SARS-CoV-2 by FDA  under an Emergency Use Authorization (EUA). This EUA will remain in effect (meaning this test can be used) for the duration of the COVID-19 declaration under Section 564(b)(1) of the Act, 21 U.S.C. section 360bbb-3(b)(1), unless the authorization is terminated or revoked.  Performed at [facility], [address] [place], [place] [address]     Labs: CBC: Recent Labs  Lab 09/27/22 0546 09/28/22 0731 10/03/22 0807  WBC 14.3* 10.0 19.1*  HGB 8.4* 9.0* 7.9*  HCT 27.4* 29.5* 25.4*  MCV 79.7* 79.9* 79.4*  PLT 39* 39* 73*   Basic Metabolic Panel: Recent Labs  Lab 09/27/22 0546 09/28/22 0731 10/03/22 0807  NA 147* 145 147*  K 4.7 4.4 4.7  CL 112* 113* 115*  CO2 26 25 25  $ GLUCOSE 127* 160* 182*  BUN 34* 37* 43*  CREATININE 1.14* 1.11* 1.28*  CALCIUM 8.1* 8.0* 8.1*  MG 1.9 2.0 2.1  PHOS 2.8 3.1 2.7   Liver Function Tests: No results for input(s): "AST", "ALT", "ALKPHOS", "BILITOT", "PROT", "ALBUMIN" in the last 168 hours. CBG: Recent Labs  Lab 10/02/22 1825  GLUCAP 175*    Discharge time spent: greater than 30 minutes.  Signed: Sharen Hones, MD [facility] 10/03/2022

## 2022-10-03 NOTE — TOC Progression Note (Addendum)
Transition of Care [facility]) - Progression Note    Patient Details  Name: Gabriela [name] MRN: [id number] Date of Birth: 08-17-1925  Transition of Care [facility]) CM/SW [place], [place] Phone Number: 10/03/2022, 10:32 AM  Clinical Narrative:     CSW called [facility] as insurance auth was not reflected in navi portal as having been started, they reported that it has gone to peer to peer review.   MD to call [phone] by 12 noon, choose option 5.   Pending insurance auth determination for dc to [facility]. Granddaughter [name] updated on above.   Expected Discharge Plan: [place] Barriers to Discharge: Continued Medical Work up  Expected Discharge Plan and Services       Living arrangements for the past 2 months: [place]: [place]         Social Determinants of Health (SDOH) Interventions SDOH Screenings   Food Insecurity: No Food Insecurity (10/01/2022)  Housing: Low Risk  (10/01/2022)  Transportation Needs: No Transportation Needs (10/01/2022)  Utilities: Not At Risk (10/01/2022)  Alcohol Screen: Low Risk  (09/12/2022)  Depression (PHQ2-9): Low Risk  (09/12/2022)  Financial Resource Strain: Low Risk  (09/07/2021)  Physical Activity: Inactive (09/07/2021)  Social Connections: Moderately Isolated (09/07/2021)  Stress: No Stress Concern Present (09/07/2021)  Tobacco Use: Low Risk  (09/21/2022)    Readmission Risk Interventions     No data to display

## 2022-10-07 DIAGNOSIS — N183 Chronic kidney disease, stage 3 unspecified: Secondary | ICD-10-CM | POA: Diagnosis not present

## 2022-10-07 DIAGNOSIS — E875 Hyperkalemia: Secondary | ICD-10-CM | POA: Diagnosis not present

## 2022-10-07 DIAGNOSIS — U071 COVID-19: Secondary | ICD-10-CM | POA: Diagnosis not present

## 2022-10-07 DIAGNOSIS — R7989 Other specified abnormal findings of blood chemistry: Secondary | ICD-10-CM | POA: Diagnosis not present

## 2022-10-07 DIAGNOSIS — G459 Transient cerebral ischemic attack, unspecified: Secondary | ICD-10-CM | POA: Diagnosis not present

## 2022-10-07 DIAGNOSIS — D472 Monoclonal gammopathy: Secondary | ICD-10-CM | POA: Diagnosis not present

## 2022-10-07 DIAGNOSIS — E870 Hyperosmolality and hypernatremia: Secondary | ICD-10-CM | POA: Diagnosis not present

## 2022-10-07 DIAGNOSIS — M6281 Muscle weakness (generalized): Secondary | ICD-10-CM | POA: Diagnosis not present

## 2022-10-07 DIAGNOSIS — J9620 Acute and chronic respiratory failure, unspecified whether with hypoxia or hypercapnia: Secondary | ICD-10-CM | POA: Diagnosis not present

## 2022-10-07 DIAGNOSIS — J208 Acute bronchitis due to other specified organisms: Secondary | ICD-10-CM | POA: Diagnosis not present

## 2022-10-07 DIAGNOSIS — I5022 Chronic systolic (congestive) heart failure: Secondary | ICD-10-CM | POA: Diagnosis not present

## 2022-10-07 DIAGNOSIS — E1159 Type 2 diabetes mellitus with other circulatory complications: Secondary | ICD-10-CM | POA: Diagnosis not present

## 2022-10-11 DIAGNOSIS — E870 Hyperosmolality and hypernatremia: Secondary | ICD-10-CM | POA: Diagnosis not present

## 2022-10-11 DIAGNOSIS — I5022 Chronic systolic (congestive) heart failure: Secondary | ICD-10-CM | POA: Diagnosis not present

## 2022-10-11 DIAGNOSIS — M6281 Muscle weakness (generalized): Secondary | ICD-10-CM | POA: Diagnosis not present

## 2022-10-11 DIAGNOSIS — E1159 Type 2 diabetes mellitus with other circulatory complications: Secondary | ICD-10-CM | POA: Diagnosis not present

## 2022-10-11 DIAGNOSIS — D472 Monoclonal gammopathy: Secondary | ICD-10-CM | POA: Diagnosis not present

## 2022-10-11 DIAGNOSIS — N183 Chronic kidney disease, stage 3 unspecified: Secondary | ICD-10-CM | POA: Diagnosis not present

## 2022-10-11 DIAGNOSIS — G459 Transient cerebral ischemic attack, unspecified: Secondary | ICD-10-CM | POA: Diagnosis not present

## 2022-10-11 DIAGNOSIS — J9620 Acute and chronic respiratory failure, unspecified whether with hypoxia or hypercapnia: Secondary | ICD-10-CM | POA: Diagnosis not present

## 2022-10-11 DIAGNOSIS — U071 COVID-19: Secondary | ICD-10-CM | POA: Diagnosis not present

## 2022-10-11 DIAGNOSIS — R7989 Other specified abnormal findings of blood chemistry: Secondary | ICD-10-CM | POA: Diagnosis not present

## 2022-10-11 DIAGNOSIS — J208 Acute bronchitis due to other specified organisms: Secondary | ICD-10-CM | POA: Diagnosis not present

## 2022-10-11 DIAGNOSIS — E875 Hyperkalemia: Secondary | ICD-10-CM | POA: Diagnosis not present

## 2022-10-14 DIAGNOSIS — E875 Hyperkalemia: Secondary | ICD-10-CM | POA: Diagnosis not present

## 2022-10-14 DIAGNOSIS — N183 Chronic kidney disease, stage 3 unspecified: Secondary | ICD-10-CM | POA: Diagnosis not present

## 2022-10-14 DIAGNOSIS — R7989 Other specified abnormal findings of blood chemistry: Secondary | ICD-10-CM | POA: Diagnosis not present

## 2022-10-14 DIAGNOSIS — R600 Localized edema: Secondary | ICD-10-CM | POA: Diagnosis not present

## 2022-10-14 DIAGNOSIS — J9620 Acute and chronic respiratory failure, unspecified whether with hypoxia or hypercapnia: Secondary | ICD-10-CM | POA: Diagnosis not present

## 2022-10-14 DIAGNOSIS — M6281 Muscle weakness (generalized): Secondary | ICD-10-CM | POA: Diagnosis not present

## 2022-10-14 DIAGNOSIS — I5022 Chronic systolic (congestive) heart failure: Secondary | ICD-10-CM | POA: Diagnosis not present

## 2022-10-14 DIAGNOSIS — J208 Acute bronchitis due to other specified organisms: Secondary | ICD-10-CM | POA: Diagnosis not present

## 2022-10-14 DIAGNOSIS — E1159 Type 2 diabetes mellitus with other circulatory complications: Secondary | ICD-10-CM | POA: Diagnosis not present

## 2022-10-14 DIAGNOSIS — U071 COVID-19: Secondary | ICD-10-CM | POA: Diagnosis not present

## 2022-10-14 DIAGNOSIS — G459 Transient cerebral ischemic attack, unspecified: Secondary | ICD-10-CM | POA: Diagnosis not present

## 2022-10-14 DIAGNOSIS — E870 Hyperosmolality and hypernatremia: Secondary | ICD-10-CM | POA: Diagnosis not present

## 2022-10-17 DIAGNOSIS — R600 Localized edema: Secondary | ICD-10-CM | POA: Diagnosis not present

## 2022-10-17 DIAGNOSIS — N183 Chronic kidney disease, stage 3 unspecified: Secondary | ICD-10-CM | POA: Diagnosis not present

## 2022-10-17 DIAGNOSIS — R7989 Other specified abnormal findings of blood chemistry: Secondary | ICD-10-CM | POA: Diagnosis not present

## 2022-10-17 DIAGNOSIS — U071 COVID-19: Secondary | ICD-10-CM | POA: Diagnosis not present

## 2022-10-17 DIAGNOSIS — G459 Transient cerebral ischemic attack, unspecified: Secondary | ICD-10-CM | POA: Diagnosis not present

## 2022-10-17 DIAGNOSIS — J9620 Acute and chronic respiratory failure, unspecified whether with hypoxia or hypercapnia: Secondary | ICD-10-CM | POA: Diagnosis not present

## 2022-10-17 DIAGNOSIS — J208 Acute bronchitis due to other specified organisms: Secondary | ICD-10-CM | POA: Diagnosis not present

## 2022-10-17 DIAGNOSIS — E870 Hyperosmolality and hypernatremia: Secondary | ICD-10-CM | POA: Diagnosis not present

## 2022-10-17 DIAGNOSIS — E875 Hyperkalemia: Secondary | ICD-10-CM | POA: Diagnosis not present

## 2022-10-17 DIAGNOSIS — M6281 Muscle weakness (generalized): Secondary | ICD-10-CM | POA: Diagnosis not present

## 2022-10-17 DIAGNOSIS — E1159 Type 2 diabetes mellitus with other circulatory complications: Secondary | ICD-10-CM | POA: Diagnosis not present

## 2022-10-17 DIAGNOSIS — I5022 Chronic systolic (congestive) heart failure: Secondary | ICD-10-CM | POA: Diagnosis not present

## 2022-10-19 DIAGNOSIS — S728X9A Other fracture of unspecified femur, initial encounter for closed fracture: Secondary | ICD-10-CM | POA: Diagnosis not present

## 2022-10-20 DIAGNOSIS — J9601 Acute respiratory failure with hypoxia: Secondary | ICD-10-CM | POA: Diagnosis not present

## 2022-10-20 DIAGNOSIS — J90 Pleural effusion, not elsewhere classified: Secondary | ICD-10-CM | POA: Diagnosis not present

## 2022-10-20 DIAGNOSIS — J209 Acute bronchitis, unspecified: Secondary | ICD-10-CM | POA: Diagnosis not present

## 2022-10-20 DIAGNOSIS — T148XXA Other injury of unspecified body region, initial encounter: Secondary | ICD-10-CM | POA: Diagnosis not present

## 2022-10-20 DIAGNOSIS — U071 COVID-19: Secondary | ICD-10-CM | POA: Diagnosis not present

## 2022-10-20 DIAGNOSIS — S728X9A Other fracture of unspecified femur, initial encounter for closed fracture: Secondary | ICD-10-CM | POA: Diagnosis not present

## 2022-10-20 DIAGNOSIS — I5022 Chronic systolic (congestive) heart failure: Secondary | ICD-10-CM | POA: Diagnosis not present

## 2022-10-21 ENCOUNTER — Encounter: Payer: Self-pay | Admitting: Family Medicine

## 2022-10-21 ENCOUNTER — Other Ambulatory Visit: Payer: Self-pay | Admitting: Family Medicine

## 2022-10-21 ENCOUNTER — Telehealth: Payer: Self-pay

## 2022-10-21 DIAGNOSIS — S72331D Displaced oblique fracture of shaft of right femur, subsequent encounter for closed fracture with routine healing: Secondary | ICD-10-CM | POA: Diagnosis not present

## 2022-10-21 DIAGNOSIS — S72002D Fracture of unspecified part of neck of left femur, subsequent encounter for closed fracture with routine healing: Secondary | ICD-10-CM | POA: Diagnosis not present

## 2022-10-21 DIAGNOSIS — D649 Anemia, unspecified: Secondary | ICD-10-CM | POA: Diagnosis not present

## 2022-10-21 DIAGNOSIS — Z7982 Long term (current) use of aspirin: Secondary | ICD-10-CM | POA: Diagnosis not present

## 2022-10-21 DIAGNOSIS — T148XXA Other injury of unspecified body region, initial encounter: Secondary | ICD-10-CM

## 2022-10-21 DIAGNOSIS — Z993 Dependence on wheelchair: Secondary | ICD-10-CM | POA: Diagnosis not present

## 2022-10-21 DIAGNOSIS — Z9181 History of falling: Secondary | ICD-10-CM | POA: Diagnosis not present

## 2022-10-21 DIAGNOSIS — E1159 Type 2 diabetes mellitus with other circulatory complications: Secondary | ICD-10-CM | POA: Diagnosis not present

## 2022-10-21 DIAGNOSIS — I10 Essential (primary) hypertension: Secondary | ICD-10-CM | POA: Diagnosis not present

## 2022-10-21 DIAGNOSIS — R600 Localized edema: Secondary | ICD-10-CM | POA: Diagnosis not present

## 2022-10-21 NOTE — Transitions of Care (Post Inpatient/ED Visit) (Signed)
   10/21/2022  Name: Gabriela [name] MRN: [id number] DOB: May 16, 1926  Today's TOC FU Call Status: Today's TOC FU Call Status:: Successful TOC FU Call Competed TOC FU Call Complete Date: 10/21/22  Transition Care Management Follow-up Telephone Call Date of Discharge: 10/20/22 Discharge Facility: Other ([place]) Name of Other (Non[facility]) Discharge Facility: [facility] Type of Discharge: Inpatient Admission Primary Inpatient Discharge Diagnosis:: cerebral infarction How have you been since you were released from the hospital?: Better Any questions or concerns?: Yes Patient Questions/Concerns:: wound opened yesterday Patient Questions/Concerns Addressed: Notified Provider of Patient Questions/Concerns  Items Reviewed: Did you receive and understand the discharge instructions provided?: Yes Medications obtained and verified?: Yes (Medications Reviewed) Any new allergies since your discharge?: No Dietary orders reviewed?: Yes Do you have support at home?: Yes People in Home: grandchild(ren)  Home Care and Equipment/Supplies: [place] Ordered?: Yes Name of [place]:: [facility] Has Agency set up a time to come to your home?: Yes [place] Visit Date: 10/22/22 Any new equipment or medical supplies ordered?: NA  Functional Questionnaire: Do you need assistance with bathing/showering or dressing?: Yes Do you need assistance with meal preparation?: Yes Do you need assistance with eating?: No Do you have difficulty maintaining continence: Yes Do you need assistance with getting out of bed/getting out of a chair/moving?: Yes Do you have difficulty managing or taking your medications?: Yes  Folllow up appointments reviewed: PCP Follow-up appointment confirmed?: No (no avail appt sent message to staff to scheule) MD Provider Line Number:[phone] Given: No [facility] Follow-up appointment confirmed?: Yes Date of Specialist  follow-up appointment?: 11/02/22 Follow-Up Specialty Provider:: Ortho DR Marcelino Scot Do you need transportation to your follow-up appointment?: No Do you understand care options if your condition(s) worsen?: Yes-patient verbalized understanding    SIGNATURE Juanda Crumble, [place] Nurse Health Advisor Direct Dial [phone]

## 2022-10-25 ENCOUNTER — Telehealth: Payer: Self-pay | Admitting: Family Medicine

## 2022-10-25 DIAGNOSIS — M150 Primary generalized (osteo)arthritis: Secondary | ICD-10-CM | POA: Diagnosis not present

## 2022-10-25 DIAGNOSIS — S72331D Displaced oblique fracture of shaft of right femur, subsequent encounter for closed fracture with routine healing: Secondary | ICD-10-CM | POA: Diagnosis not present

## 2022-10-25 DIAGNOSIS — Z5982 Transportation insecurity: Secondary | ICD-10-CM | POA: Diagnosis not present

## 2022-10-25 DIAGNOSIS — Z604 Social exclusion and rejection: Secondary | ICD-10-CM | POA: Diagnosis not present

## 2022-10-25 DIAGNOSIS — J9610 Chronic respiratory failure, unspecified whether with hypoxia or hypercapnia: Secondary | ICD-10-CM | POA: Diagnosis not present

## 2022-10-25 DIAGNOSIS — Z951 Presence of aortocoronary bypass graft: Secondary | ICD-10-CM | POA: Diagnosis not present

## 2022-10-25 DIAGNOSIS — S72002D Fracture of unspecified part of neck of left femur, subsequent encounter for closed fracture with routine healing: Secondary | ICD-10-CM | POA: Diagnosis not present

## 2022-10-25 DIAGNOSIS — I130 Hypertensive heart and chronic kidney disease with heart failure and stage 1 through stage 4 chronic kidney disease, or unspecified chronic kidney disease: Secondary | ICD-10-CM | POA: Diagnosis not present

## 2022-10-25 DIAGNOSIS — I2510 Atherosclerotic heart disease of native coronary artery without angina pectoris: Secondary | ICD-10-CM | POA: Diagnosis not present

## 2022-10-25 DIAGNOSIS — E875 Hyperkalemia: Secondary | ICD-10-CM | POA: Diagnosis not present

## 2022-10-25 DIAGNOSIS — R1311 Dysphagia, oral phase: Secondary | ICD-10-CM | POA: Diagnosis not present

## 2022-10-25 DIAGNOSIS — E1122 Type 2 diabetes mellitus with diabetic chronic kidney disease: Secondary | ICD-10-CM | POA: Diagnosis not present

## 2022-10-25 DIAGNOSIS — H4010X0 Unspecified open-angle glaucoma, stage unspecified: Secondary | ICD-10-CM | POA: Diagnosis not present

## 2022-10-25 DIAGNOSIS — Z993 Dependence on wheelchair: Secondary | ICD-10-CM | POA: Diagnosis not present

## 2022-10-25 DIAGNOSIS — N1831 Chronic kidney disease, stage 3a: Secondary | ICD-10-CM | POA: Diagnosis not present

## 2022-10-25 DIAGNOSIS — I69391 Dysphagia following cerebral infarction: Secondary | ICD-10-CM | POA: Diagnosis not present

## 2022-10-25 DIAGNOSIS — Z7902 Long term (current) use of antithrombotics/antiplatelets: Secondary | ICD-10-CM | POA: Diagnosis not present

## 2022-10-25 DIAGNOSIS — Z9181 History of falling: Secondary | ICD-10-CM | POA: Diagnosis not present

## 2022-10-25 DIAGNOSIS — I5022 Chronic systolic (congestive) heart failure: Secondary | ICD-10-CM | POA: Diagnosis not present

## 2022-10-25 DIAGNOSIS — Z9049 Acquired absence of other specified parts of digestive tract: Secondary | ICD-10-CM | POA: Diagnosis not present

## 2022-10-25 DIAGNOSIS — U071 COVID-19: Secondary | ICD-10-CM | POA: Diagnosis not present

## 2022-10-25 DIAGNOSIS — Z556 Problems related to health literacy: Secondary | ICD-10-CM | POA: Diagnosis not present

## 2022-10-25 DIAGNOSIS — D631 Anemia in chronic kidney disease: Secondary | ICD-10-CM | POA: Diagnosis not present

## 2022-10-25 DIAGNOSIS — D472 Monoclonal gammopathy: Secondary | ICD-10-CM | POA: Diagnosis not present

## 2022-10-25 NOTE — Progress Notes (Unsigned)
MyChart Video Visit    Virtual Visit via Video Note   This format is felt to be most appropriate for this patient at this time. Physical exam was limited by quality of the video and audio technology used for the visit.   Patient location: patient's home address  Provider location:  [facility] [address] [place]  [place], [place] [address]   I discussed the limitations of evaluation and management by telemedicine and the availability of in person appointments. The patient expressed understanding and agreed to proceed.  [name]   DOB: 03/20/1926   89 y.o. Female  MRN: [id number] Visit Date: 10/26/2022  Today's healthcare provider: [name], MD   Chief Complaint  Patient presents with  . Hospitalization Follow-up   Subjective    Patient is with granddaughter for visit. She reports patient is doing well. She states wound is doing ok. She was sent to rehab at discharge, [facility]; she was there for 2 weeks and is now home. She was given doxycycline and amoxicillin on 10/20/22 by the facility, she has one dose left. Granddaughter is concerned about patient being on aspirin and Plavix. She is also asking for supplies for her wound, nursing has come out but has not brought supplies. She also asks about finding a provider for speech therapy as current PT provider does not have these services. Patient also is now on O2/2L QHS.    Follow up Hospitalization  Information obtained from review of EMR for dates listed below Patient was admitted to [facility] on 09/21/2022 and discharged on 10/03/2022. She was treated for Acute hypoxic respiratory failure. Treatment for this included labs and imaging, amp of D50 and started on IV fluids with D5 minute, and nebulizer treatments and 1 dose of prednisone and 1 dose of Lasix. Telephone follow up was done on 10/21/2022 She reports good compliance with treatment. She reports this condition is stayed  the same.  Patient has since had her chronic RLE wound begin to drain fluid per patient message from her granddaughter  Orders were placed for home health wound care    Today they state patient is requiring two liters of oxygen at bedtime They report that one of the home health aids was diagnosed with COVID  In regards to stroke, she has been having more drooling  Patient states that she feels better since she is home from facility  She is having trouble with enunciating words since the CVA, swallowing is doing ok, they are continuing to crush pills for medication administration  She reports that the wound is still draining fluid after finishing abx, the drainage has not had a foul odor, they deny that the area feels warm to touch  -----------------------------------------------------------------------------------------    Medications: Outpatient Medications Prior to Visit  Medication Sig  . acetaminophen (TYLENOL) 325 MG tablet Take 2 tablets (650 mg total) by mouth 3 (three) times daily. Wean as able  . albuterol (VENTOLIN HFA) 108 (90 Base) MCG/ACT inhaler Inhale 2 puffs into the lungs every 4 (four) hours as needed for wheezing or shortness of breath.  Marland Kitchen amoxicillin (AMOXIL) 500 MG tablet Take 500 mg by mouth 3 (three) times daily.  . clopidogrel (PLAVIX) 75 MG tablet Take 1 tablet (75 mg total) by mouth daily.  . diclofenac Sodium (VOLTAREN) 1 % GEL Apply 2 g topically 4 (four) times daily.  Marland Kitchen doxycycline (MONODOX) 100 MG capsule Take 100 mg by mouth 2 (two) times daily.  Marland Kitchen  ferrous sulfate 325 (65 FE) MG tablet Take 1 tablet (325 mg total) by mouth daily with breakfast.  . furosemide (LASIX) 20 MG tablet Take 20 mg by mouth daily.  Marland Kitchen ipratropium-albuterol (DUONEB) 0.5-2.5 (3) MG/3ML SOLN Take 3 mLs by nebulization every 4 (four) hours as needed.  Marland Kitchen KLOR-CON M20 20 MEQ tablet Take 20 mEq by mouth daily.  . methocarbamol (ROBAXIN) 500 MG tablet Take 1 tablet (500 mg total) by mouth  every 6 (six) hours as needed for muscle spasms.  . metoprolol succinate (TOPROL-XL) 25 MG 24 hr tablet Take 0.5 tablets (12.5 mg total) by mouth daily.  . Multiple Vitamin (MULTIVITAMIN WITH MINERALS) TABS tablet Take 1 tablet by mouth daily.  Marland Kitchen SIMBRINZA 1-0.2 % SUSP Place 1 drop into both eyes 2 (two) times daily.  . sodium bicarbonate 650 MG tablet Take 650 mg by mouth 2 (two) times daily.  . timolol (TIMOPTIC) 0.5 % ophthalmic solution Place 1 drop into both eyes 2 (two) times daily.  . traMADol (ULTRAM) 50 MG tablet Take 1 tablet (50 mg total) by mouth every 8 (eight) hours as needed.  . Vitamin D, Ergocalciferol, (DRISDOL) 1.25 MG (50000 UNIT) CAPS capsule Take 1 capsule (50,000 Units total) by mouth every 7 (seven) days.  . calcium carbonate (TUMS - DOSED IN MG ELEMENTAL CALCIUM) 500 MG chewable tablet Chew 2 tablets (400 mg of elemental calcium total) by mouth daily. (Patient not taking: Reported on 10/26/2022)  . ipratropium (ATROVENT HFA) 17 MCG/ACT inhaler Inhale 2 puffs into the lungs every 4 (four) hours as needed for wheezing.  Marland Kitchen loratadine (CLARITIN) 10 MG tablet TAKE 1 TABLET BY MOUTH EVERY DAY (Patient not taking: Reported on 10/26/2022)  . [DISCONTINUED] doxycycline (ADOXA) 100 MG tablet Take 100 mg by mouth 2 (two) times daily. (Patient not taking: Reported on 10/26/2022)   No facility-administered medications prior to visit.    Review of Systems  {Labs  Heme  Chem  Endocrine  Serology  Results Review (optional):23779}   Objective    BP 134/71   Pulse 78   Ht 5' (1.524 m)   Wt 132 lb (59.9 kg)   SpO2 98%   BMI 25.78 kg/m   {Show previous vital signs (optional):23777}   Physical Exam Constitutional:      General: She is not in acute distress.    Appearance: Normal appearance. She is not ill-appearing or toxic-appearing.  Neurological:     Mental Status: She is alert.       Assessment & Plan     Problem List Items Addressed This Visit        Cardiovascular and Mediastinum   Acute stroke due to ischemia (HCC) - Primary   Relevant Medications   furosemide (LASIX) 20 MG tablet   atorvastatin (LIPITOR) 20 MG tablet   Other Visit Diagnoses     [place] of thrombotic cerebrovascular accident (CVA)       Relevant Medications   atorvastatin (LIPITOR) 20 MG tablet        No follow-ups on file.     I discussed the assessment and treatment plan with the patient. The patient was provided an opportunity to ask questions and all were answered. The patient agreed with the plan and demonstrated an understanding of the instructions.   The patient was advised to call back or seek an in-person evaluation if the symptoms worsen or if the condition fails to improve as anticipated.  I provided 30 minutes of non-face-to-face time during this encounter.  The entirety of the information documented in the History of Present Illness, Review of Systems and Physical Exam were personally obtained by me. Portions of this information were initially documented by Pricilla Handler, [place]  . I, [name], MD have reviewed the documentation above for thoroughness and accuracy.     [name], MD [facility] [phone] (phone) [phone] (fax)  [place]

## 2022-10-25 NOTE — Telephone Encounter (Signed)
Soni with [facility] called to say they do not have Speech Therapy to provide to the patient.

## 2022-10-26 ENCOUNTER — Encounter: Payer: Self-pay | Admitting: Family Medicine

## 2022-10-26 ENCOUNTER — Telehealth (INDEPENDENT_AMBULATORY_CARE_PROVIDER_SITE_OTHER): Payer: 59 | Admitting: Family Medicine

## 2022-10-26 VITALS — BP 134/71 | HR 78 | Ht 60.0 in | Wt 132.0 lb

## 2022-10-26 DIAGNOSIS — I639 Cerebral infarction, unspecified: Secondary | ICD-10-CM

## 2022-10-26 DIAGNOSIS — S72331D Displaced oblique fracture of shaft of right femur, subsequent encounter for closed fracture with routine healing: Secondary | ICD-10-CM | POA: Diagnosis not present

## 2022-10-26 DIAGNOSIS — Z604 Social exclusion and rejection: Secondary | ICD-10-CM | POA: Diagnosis not present

## 2022-10-26 DIAGNOSIS — Z9049 Acquired absence of other specified parts of digestive tract: Secondary | ICD-10-CM | POA: Diagnosis not present

## 2022-10-26 DIAGNOSIS — Z993 Dependence on wheelchair: Secondary | ICD-10-CM | POA: Diagnosis not present

## 2022-10-26 DIAGNOSIS — L089 Local infection of the skin and subcutaneous tissue, unspecified: Secondary | ICD-10-CM | POA: Diagnosis not present

## 2022-10-26 DIAGNOSIS — I5022 Chronic systolic (congestive) heart failure: Secondary | ICD-10-CM | POA: Diagnosis not present

## 2022-10-26 DIAGNOSIS — I130 Hypertensive heart and chronic kidney disease with heart failure and stage 1 through stage 4 chronic kidney disease, or unspecified chronic kidney disease: Secondary | ICD-10-CM | POA: Diagnosis not present

## 2022-10-26 DIAGNOSIS — Z5982 Transportation insecurity: Secondary | ICD-10-CM | POA: Diagnosis not present

## 2022-10-26 DIAGNOSIS — H4010X0 Unspecified open-angle glaucoma, stage unspecified: Secondary | ICD-10-CM | POA: Diagnosis not present

## 2022-10-26 DIAGNOSIS — E875 Hyperkalemia: Secondary | ICD-10-CM | POA: Diagnosis not present

## 2022-10-26 DIAGNOSIS — D631 Anemia in chronic kidney disease: Secondary | ICD-10-CM | POA: Diagnosis not present

## 2022-10-26 DIAGNOSIS — Z556 Problems related to health literacy: Secondary | ICD-10-CM | POA: Diagnosis not present

## 2022-10-26 DIAGNOSIS — I6930 Unspecified sequelae of cerebral infarction: Secondary | ICD-10-CM

## 2022-10-26 DIAGNOSIS — Z951 Presence of aortocoronary bypass graft: Secondary | ICD-10-CM | POA: Diagnosis not present

## 2022-10-26 DIAGNOSIS — M150 Primary generalized (osteo)arthritis: Secondary | ICD-10-CM | POA: Diagnosis not present

## 2022-10-26 DIAGNOSIS — D472 Monoclonal gammopathy: Secondary | ICD-10-CM | POA: Diagnosis not present

## 2022-10-26 DIAGNOSIS — Z9181 History of falling: Secondary | ICD-10-CM | POA: Diagnosis not present

## 2022-10-26 DIAGNOSIS — I6990 Unspecified sequelae of unspecified cerebrovascular disease: Secondary | ICD-10-CM

## 2022-10-26 DIAGNOSIS — T148XXA Other injury of unspecified body region, initial encounter: Secondary | ICD-10-CM | POA: Diagnosis not present

## 2022-10-26 DIAGNOSIS — S72002D Fracture of unspecified part of neck of left femur, subsequent encounter for closed fracture with routine healing: Secondary | ICD-10-CM | POA: Diagnosis not present

## 2022-10-26 DIAGNOSIS — U071 COVID-19: Secondary | ICD-10-CM | POA: Diagnosis not present

## 2022-10-26 DIAGNOSIS — N1831 Chronic kidney disease, stage 3a: Secondary | ICD-10-CM | POA: Diagnosis not present

## 2022-10-26 DIAGNOSIS — J9610 Chronic respiratory failure, unspecified whether with hypoxia or hypercapnia: Secondary | ICD-10-CM | POA: Diagnosis not present

## 2022-10-26 DIAGNOSIS — Z7902 Long term (current) use of antithrombotics/antiplatelets: Secondary | ICD-10-CM | POA: Diagnosis not present

## 2022-10-26 DIAGNOSIS — R1311 Dysphagia, oral phase: Secondary | ICD-10-CM | POA: Diagnosis not present

## 2022-10-26 DIAGNOSIS — I69391 Dysphagia following cerebral infarction: Secondary | ICD-10-CM | POA: Diagnosis not present

## 2022-10-26 DIAGNOSIS — E1122 Type 2 diabetes mellitus with diabetic chronic kidney disease: Secondary | ICD-10-CM | POA: Diagnosis not present

## 2022-10-26 DIAGNOSIS — I2510 Atherosclerotic heart disease of native coronary artery without angina pectoris: Secondary | ICD-10-CM | POA: Diagnosis not present

## 2022-10-26 MED ORDER — CIPROFLOXACIN HCL 500 MG PO TABS: 500.0000 mg | ORAL_TABLET | Freq: Two times a day (BID) | ORAL | 0 refills | Status: AC

## 2022-10-26 MED ORDER — ATORVASTATIN CALCIUM 20 MG PO TABS: 20.0000 mg | ORAL_TABLET | Freq: Every day | ORAL | 3 refills | Status: DC

## 2022-10-26 NOTE — Assessment & Plan Note (Addendum)
Recently diagnosed based on EF 45% Reports bilateral pitting edema  Previously switched to torsemide but family hesitant to use this medication after recent CVA close to med introduction, counseling provided that CVA was likely secondary to COVID infection more so than torsemide but given patient has been on lasix throughout hospitalization, will adjust this dose Recommended increased lasix to '20mg'$  BID for three days  Will also increase K to BID dosing for three days  Patient is homebound without access to lab draws for follow up BMP so she will have labs drawn at nephrology visit scheduled for 3/26 She will continue metoprolol

## 2022-10-27 ENCOUNTER — Encounter: Payer: Self-pay | Admitting: Family Medicine

## 2022-10-27 DIAGNOSIS — I6990 Unspecified sequelae of unspecified cerebrovascular disease: Secondary | ICD-10-CM | POA: Insufficient documentation

## 2022-10-27 NOTE — Addendum Note (Signed)
Addended by: Adolph Pollack on: 10/27/2022 11:29 PM   Modules accepted: Orders

## 2022-10-27 NOTE — Addendum Note (Signed)
Addended by: Adolph Pollack on: 10/27/2022 12:01 PM   Modules accepted: Orders

## 2022-10-27 NOTE — Assessment & Plan Note (Signed)
Chronic wound  This is reoccurrence of wound drainage  Orders have been placed for home health wound care with dressing changes  Family is requesting supplies to manage wound care daily due to increased wound drainage  Will contact agency to confirm wound care services are ordered appropriately and request supplies for family to maintain regular dressing changes

## 2022-10-27 NOTE — Assessment & Plan Note (Signed)
Presenting with increased drooling as late effect of CVA Patient continues to work with home health for PT, OT Needs SLP therapy which is not a service offered by current home health agency

## 2022-10-28 ENCOUNTER — Encounter: Payer: Self-pay | Admitting: Family Medicine

## 2022-10-28 ENCOUNTER — Ambulatory Visit
Admission: RE | Admit: 2022-10-28 | Discharge: 2022-10-28 | Disposition: A | Discharge status: Home / Self Care | Payer: 59 | Source: Ambulatory Visit | Attending: Family Medicine | Admitting: Family Medicine

## 2022-10-28 ENCOUNTER — Telehealth: Payer: Self-pay | Admitting: Family Medicine

## 2022-10-28 ENCOUNTER — Other Ambulatory Visit: Payer: Self-pay | Admitting: Family Medicine

## 2022-10-28 DIAGNOSIS — Z556 Problems related to health literacy: Secondary | ICD-10-CM | POA: Diagnosis not present

## 2022-10-28 DIAGNOSIS — S72331D Displaced oblique fracture of shaft of right femur, subsequent encounter for closed fracture with routine healing: Secondary | ICD-10-CM | POA: Diagnosis not present

## 2022-10-28 DIAGNOSIS — E1122 Type 2 diabetes mellitus with diabetic chronic kidney disease: Secondary | ICD-10-CM | POA: Diagnosis not present

## 2022-10-28 DIAGNOSIS — M25552 Pain in left hip: Secondary | ICD-10-CM | POA: Diagnosis not present

## 2022-10-28 DIAGNOSIS — Z5982 Transportation insecurity: Secondary | ICD-10-CM | POA: Diagnosis not present

## 2022-10-28 DIAGNOSIS — R1311 Dysphagia, oral phase: Secondary | ICD-10-CM | POA: Diagnosis not present

## 2022-10-28 DIAGNOSIS — E875 Hyperkalemia: Secondary | ICD-10-CM | POA: Diagnosis not present

## 2022-10-28 DIAGNOSIS — L89309 Pressure ulcer of unspecified buttock, unspecified stage: Secondary | ICD-10-CM | POA: Diagnosis not present

## 2022-10-28 DIAGNOSIS — Z9181 History of falling: Secondary | ICD-10-CM | POA: Diagnosis not present

## 2022-10-28 DIAGNOSIS — I2510 Atherosclerotic heart disease of native coronary artery without angina pectoris: Secondary | ICD-10-CM | POA: Diagnosis not present

## 2022-10-28 DIAGNOSIS — I69391 Dysphagia following cerebral infarction: Secondary | ICD-10-CM | POA: Diagnosis not present

## 2022-10-28 DIAGNOSIS — U071 COVID-19: Secondary | ICD-10-CM | POA: Diagnosis not present

## 2022-10-28 DIAGNOSIS — Z7902 Long term (current) use of antithrombotics/antiplatelets: Secondary | ICD-10-CM | POA: Diagnosis not present

## 2022-10-28 DIAGNOSIS — N1831 Chronic kidney disease, stage 3a: Secondary | ICD-10-CM | POA: Diagnosis not present

## 2022-10-28 DIAGNOSIS — Z604 Social exclusion and rejection: Secondary | ICD-10-CM | POA: Diagnosis not present

## 2022-10-28 DIAGNOSIS — M25551 Pain in right hip: Secondary | ICD-10-CM | POA: Diagnosis not present

## 2022-10-28 DIAGNOSIS — Z9049 Acquired absence of other specified parts of digestive tract: Secondary | ICD-10-CM | POA: Diagnosis not present

## 2022-10-28 DIAGNOSIS — I130 Hypertensive heart and chronic kidney disease with heart failure and stage 1 through stage 4 chronic kidney disease, or unspecified chronic kidney disease: Secondary | ICD-10-CM | POA: Diagnosis not present

## 2022-10-28 DIAGNOSIS — D472 Monoclonal gammopathy: Secondary | ICD-10-CM | POA: Diagnosis not present

## 2022-10-28 DIAGNOSIS — I5022 Chronic systolic (congestive) heart failure: Secondary | ICD-10-CM | POA: Diagnosis not present

## 2022-10-28 DIAGNOSIS — M150 Primary generalized (osteo)arthritis: Secondary | ICD-10-CM | POA: Diagnosis not present

## 2022-10-28 DIAGNOSIS — H4010X0 Unspecified open-angle glaucoma, stage unspecified: Secondary | ICD-10-CM | POA: Diagnosis not present

## 2022-10-28 DIAGNOSIS — Z951 Presence of aortocoronary bypass graft: Secondary | ICD-10-CM | POA: Diagnosis not present

## 2022-10-28 DIAGNOSIS — D631 Anemia in chronic kidney disease: Secondary | ICD-10-CM | POA: Diagnosis not present

## 2022-10-28 DIAGNOSIS — J9610 Chronic respiratory failure, unspecified whether with hypoxia or hypercapnia: Secondary | ICD-10-CM | POA: Diagnosis not present

## 2022-10-28 DIAGNOSIS — Z993 Dependence on wheelchair: Secondary | ICD-10-CM | POA: Diagnosis not present

## 2022-10-28 DIAGNOSIS — S72002D Fracture of unspecified part of neck of left femur, subsequent encounter for closed fracture with routine healing: Secondary | ICD-10-CM | POA: Diagnosis not present

## 2022-10-28 NOTE — Telephone Encounter (Signed)
Please provide verbal orders for wound care supplies needed by RN   [name], MD  [facility]

## 2022-10-28 NOTE — Telephone Encounter (Signed)
Verbal orders given to [facility]

## 2022-10-28 NOTE — Telephone Encounter (Signed)
Denelle Waters calling from [place] is calling to request if order can be changed from [facility] Infant Hip [phone]

## 2022-10-28 NOTE — Telephone Encounter (Signed)
The wound the provider is aware of has increased drainage /Currently coving with foam dressing and Marcie Bal would like to change to hydro fiber to soak up increased drainage   There is a New wound to the right hip/ measuring 2x2 sonometers and skin is broken / Marcie Bal thinks it maybe coming from the sliding board / she would like an order for DuoDerm to go over that/ verbal order can be left and secure vm can be left if no answer

## 2022-10-28 NOTE — Telephone Encounter (Signed)
Called and was advised that the radiologist is going to fix order.

## 2022-10-30 DIAGNOSIS — U071 COVID-19: Secondary | ICD-10-CM | POA: Diagnosis not present

## 2022-10-30 DIAGNOSIS — I69391 Dysphagia following cerebral infarction: Secondary | ICD-10-CM | POA: Diagnosis not present

## 2022-10-30 DIAGNOSIS — I132 Hypertensive heart and chronic kidney disease with heart failure and with stage 5 chronic kidney disease, or end stage renal disease: Secondary | ICD-10-CM | POA: Diagnosis not present

## 2022-10-30 DIAGNOSIS — D472 Monoclonal gammopathy: Secondary | ICD-10-CM | POA: Diagnosis not present

## 2022-10-30 DIAGNOSIS — L89614 Pressure ulcer of right heel, stage 4: Secondary | ICD-10-CM | POA: Diagnosis not present

## 2022-10-30 DIAGNOSIS — I5022 Chronic systolic (congestive) heart failure: Secondary | ICD-10-CM | POA: Diagnosis not present

## 2022-10-30 DIAGNOSIS — I10 Essential (primary) hypertension: Secondary | ICD-10-CM | POA: Diagnosis not present

## 2022-10-30 DIAGNOSIS — E1122 Type 2 diabetes mellitus with diabetic chronic kidney disease: Secondary | ICD-10-CM | POA: Diagnosis not present

## 2022-10-30 DIAGNOSIS — J9601 Acute respiratory failure with hypoxia: Secondary | ICD-10-CM | POA: Diagnosis not present

## 2022-10-30 DIAGNOSIS — J209 Acute bronchitis, unspecified: Secondary | ICD-10-CM | POA: Diagnosis not present

## 2022-10-30 DIAGNOSIS — E1142 Type 2 diabetes mellitus with diabetic polyneuropathy: Secondary | ICD-10-CM | POA: Diagnosis not present

## 2022-10-30 DIAGNOSIS — I69354 Hemiplegia and hemiparesis following cerebral infarction affecting left non-dominant side: Secondary | ICD-10-CM | POA: Diagnosis not present

## 2022-10-30 DIAGNOSIS — Z7902 Long term (current) use of antithrombotics/antiplatelets: Secondary | ICD-10-CM | POA: Diagnosis not present

## 2022-10-30 DIAGNOSIS — N186 End stage renal disease: Secondary | ICD-10-CM | POA: Diagnosis not present

## 2022-10-30 DIAGNOSIS — I69322 Dysarthria following cerebral infarction: Secondary | ICD-10-CM | POA: Diagnosis not present

## 2022-10-30 DIAGNOSIS — M6281 Muscle weakness (generalized): Secondary | ICD-10-CM | POA: Diagnosis not present

## 2022-10-30 DIAGNOSIS — L89312 Pressure ulcer of right buttock, stage 2: Secondary | ICD-10-CM | POA: Diagnosis not present

## 2022-10-30 DIAGNOSIS — Z992 Dependence on renal dialysis: Secondary | ICD-10-CM | POA: Diagnosis not present

## 2022-10-30 DIAGNOSIS — I69392 Facial weakness following cerebral infarction: Secondary | ICD-10-CM | POA: Diagnosis not present

## 2022-10-31 ENCOUNTER — Telehealth: Payer: Self-pay | Admitting: Family Medicine

## 2022-10-31 DIAGNOSIS — I10 Essential (primary) hypertension: Secondary | ICD-10-CM | POA: Diagnosis not present

## 2022-10-31 DIAGNOSIS — E1142 Type 2 diabetes mellitus with diabetic polyneuropathy: Secondary | ICD-10-CM | POA: Diagnosis not present

## 2022-10-31 DIAGNOSIS — I69392 Facial weakness following cerebral infarction: Secondary | ICD-10-CM | POA: Diagnosis not present

## 2022-10-31 DIAGNOSIS — J209 Acute bronchitis, unspecified: Secondary | ICD-10-CM | POA: Diagnosis not present

## 2022-10-31 DIAGNOSIS — I132 Hypertensive heart and chronic kidney disease with heart failure and with stage 5 chronic kidney disease, or end stage renal disease: Secondary | ICD-10-CM | POA: Diagnosis not present

## 2022-10-31 DIAGNOSIS — I5022 Chronic systolic (congestive) heart failure: Secondary | ICD-10-CM | POA: Diagnosis not present

## 2022-10-31 DIAGNOSIS — Z992 Dependence on renal dialysis: Secondary | ICD-10-CM | POA: Diagnosis not present

## 2022-10-31 DIAGNOSIS — I69391 Dysphagia following cerebral infarction: Secondary | ICD-10-CM | POA: Diagnosis not present

## 2022-10-31 DIAGNOSIS — L89312 Pressure ulcer of right buttock, stage 2: Secondary | ICD-10-CM | POA: Diagnosis not present

## 2022-10-31 DIAGNOSIS — I69354 Hemiplegia and hemiparesis following cerebral infarction affecting left non-dominant side: Secondary | ICD-10-CM | POA: Diagnosis not present

## 2022-10-31 DIAGNOSIS — U071 COVID-19: Secondary | ICD-10-CM | POA: Diagnosis not present

## 2022-10-31 DIAGNOSIS — I69322 Dysarthria following cerebral infarction: Secondary | ICD-10-CM | POA: Diagnosis not present

## 2022-10-31 DIAGNOSIS — M6281 Muscle weakness (generalized): Secondary | ICD-10-CM | POA: Diagnosis not present

## 2022-10-31 DIAGNOSIS — Z7902 Long term (current) use of antithrombotics/antiplatelets: Secondary | ICD-10-CM | POA: Diagnosis not present

## 2022-10-31 DIAGNOSIS — E1122 Type 2 diabetes mellitus with diabetic chronic kidney disease: Secondary | ICD-10-CM | POA: Diagnosis not present

## 2022-10-31 DIAGNOSIS — L89614 Pressure ulcer of right heel, stage 4: Secondary | ICD-10-CM | POA: Diagnosis not present

## 2022-10-31 DIAGNOSIS — J9601 Acute respiratory failure with hypoxia: Secondary | ICD-10-CM | POA: Diagnosis not present

## 2022-10-31 DIAGNOSIS — D472 Monoclonal gammopathy: Secondary | ICD-10-CM | POA: Diagnosis not present

## 2022-10-31 DIAGNOSIS — N186 End stage renal disease: Secondary | ICD-10-CM | POA: Diagnosis not present

## 2022-10-31 NOTE — Telephone Encounter (Addendum)
Home Health Verbal Orders - Caller/Agency: [facility] home health agency  Callback Number: [phone]  Requesting:  [place]   Frequency:  Gardiner Fanti from the Home health agency  will be faxing an order over for a signature, to use an alginate dressing for her right heel wound

## 2022-11-01 ENCOUNTER — Telehealth: Payer: Self-pay | Admitting: Family Medicine

## 2022-11-01 NOTE — Telephone Encounter (Signed)
Home Health Verbal Orders - Caller/Agency: [facility] Number: [phone] vm can be left  Requesting home health PT  Frequency: 1x a week for 9 weeks

## 2022-11-01 NOTE — Telephone Encounter (Signed)
As per provider verbal orders given to [facility]

## 2022-11-01 NOTE — Telephone Encounter (Signed)
Ok for verbal orders.    Cher Egnor Simmons-Robinson, MD  [facility]  

## 2022-11-02 ENCOUNTER — Telehealth: Payer: Self-pay | Admitting: Family Medicine

## 2022-11-02 ENCOUNTER — Telehealth: Payer: Self-pay

## 2022-11-02 DIAGNOSIS — M9701XD Periprosthetic fracture around internal prosthetic right hip joint, subsequent encounter: Secondary | ICD-10-CM | POA: Diagnosis not present

## 2022-11-02 DIAGNOSIS — S72462D Displaced supracondylar fracture with intracondylar extension of lower end of left femur, subsequent encounter for closed fracture with routine healing: Secondary | ICD-10-CM | POA: Diagnosis not present

## 2022-11-02 DIAGNOSIS — M24551 Contracture, right hip: Secondary | ICD-10-CM | POA: Diagnosis not present

## 2022-11-02 DIAGNOSIS — S71101D Unspecified open wound, right thigh, subsequent encounter: Secondary | ICD-10-CM | POA: Diagnosis not present

## 2022-11-02 DIAGNOSIS — S72451D Displaced supracondylar fracture without intracondylar extension of lower end of right femur, subsequent encounter for closed fracture with routine healing: Secondary | ICD-10-CM | POA: Diagnosis not present

## 2022-11-02 DIAGNOSIS — L89619 Pressure ulcer of right heel, unspecified stage: Secondary | ICD-10-CM | POA: Diagnosis not present

## 2022-11-02 NOTE — Telephone Encounter (Signed)
Please Review

## 2022-11-02 NOTE — Telephone Encounter (Signed)
Copied from [place] [phone]. Topic: General - Other >> Nov 02, 2022 12:09 PM Dominique A wrote: Reason for CRM: Pt is calling to check on her hip x ray that she got on 10/28/22. Please call pt back.

## 2022-11-02 NOTE — Telephone Encounter (Signed)
Shoni from [facility] called and stated The family will be discharging pt from PT services with [facility] and will be going with another / please advise

## 2022-11-03 DIAGNOSIS — E1122 Type 2 diabetes mellitus with diabetic chronic kidney disease: Secondary | ICD-10-CM | POA: Diagnosis not present

## 2022-11-03 DIAGNOSIS — I132 Hypertensive heart and chronic kidney disease with heart failure and with stage 5 chronic kidney disease, or end stage renal disease: Secondary | ICD-10-CM | POA: Diagnosis not present

## 2022-11-03 DIAGNOSIS — L89312 Pressure ulcer of right buttock, stage 2: Secondary | ICD-10-CM | POA: Diagnosis not present

## 2022-11-03 DIAGNOSIS — L89614 Pressure ulcer of right heel, stage 4: Secondary | ICD-10-CM | POA: Diagnosis not present

## 2022-11-03 DIAGNOSIS — I10 Essential (primary) hypertension: Secondary | ICD-10-CM | POA: Diagnosis not present

## 2022-11-03 DIAGNOSIS — J9601 Acute respiratory failure with hypoxia: Secondary | ICD-10-CM | POA: Diagnosis not present

## 2022-11-03 DIAGNOSIS — Z7902 Long term (current) use of antithrombotics/antiplatelets: Secondary | ICD-10-CM | POA: Diagnosis not present

## 2022-11-03 DIAGNOSIS — I69354 Hemiplegia and hemiparesis following cerebral infarction affecting left non-dominant side: Secondary | ICD-10-CM | POA: Diagnosis not present

## 2022-11-03 DIAGNOSIS — I5022 Chronic systolic (congestive) heart failure: Secondary | ICD-10-CM | POA: Diagnosis not present

## 2022-11-03 DIAGNOSIS — N186 End stage renal disease: Secondary | ICD-10-CM | POA: Diagnosis not present

## 2022-11-03 DIAGNOSIS — E1142 Type 2 diabetes mellitus with diabetic polyneuropathy: Secondary | ICD-10-CM | POA: Diagnosis not present

## 2022-11-03 DIAGNOSIS — D472 Monoclonal gammopathy: Secondary | ICD-10-CM | POA: Diagnosis not present

## 2022-11-03 DIAGNOSIS — I69392 Facial weakness following cerebral infarction: Secondary | ICD-10-CM | POA: Diagnosis not present

## 2022-11-03 DIAGNOSIS — J209 Acute bronchitis, unspecified: Secondary | ICD-10-CM | POA: Diagnosis not present

## 2022-11-03 DIAGNOSIS — M6281 Muscle weakness (generalized): Secondary | ICD-10-CM | POA: Diagnosis not present

## 2022-11-03 DIAGNOSIS — I69391 Dysphagia following cerebral infarction: Secondary | ICD-10-CM | POA: Diagnosis not present

## 2022-11-03 DIAGNOSIS — Z992 Dependence on renal dialysis: Secondary | ICD-10-CM | POA: Diagnosis not present

## 2022-11-03 DIAGNOSIS — I69322 Dysarthria following cerebral infarction: Secondary | ICD-10-CM | POA: Diagnosis not present

## 2022-11-03 DIAGNOSIS — U071 COVID-19: Secondary | ICD-10-CM | POA: Diagnosis not present

## 2022-11-04 ENCOUNTER — Encounter: Payer: Self-pay | Admitting: Family Medicine

## 2022-11-04 ENCOUNTER — Telehealth: Payer: Self-pay | Admitting: Family Medicine

## 2022-11-04 NOTE — Telephone Encounter (Signed)
Patient's granddaughter advised

## 2022-11-04 NOTE — Telephone Encounter (Signed)
Please have family send photo via mychart of lip discoloration and hold the new statin medication for 1 week.   Please schedule follow up in the next 2 weeks   [name], MD  [facility]      =

## 2022-11-04 NOTE — Telephone Encounter (Signed)
Anderson Malta from [place] called reporting that the patient's lips are unusually dark  Best contact: [phone]  The family says this change has come about after taking a new statin drug, they've noticed this since the weekend.   Anderson Malta is not in any acute distress, all things like dehydration/difficulty breathing have been ruled out. They want to know if the provider wants to investigate further, please advise.   Agency: [phone]  Family contact: [phone] Or [phone]

## 2022-11-07 DIAGNOSIS — D472 Monoclonal gammopathy: Secondary | ICD-10-CM | POA: Diagnosis not present

## 2022-11-07 DIAGNOSIS — L89619 Pressure ulcer of right heel, unspecified stage: Secondary | ICD-10-CM | POA: Diagnosis not present

## 2022-11-07 DIAGNOSIS — E1142 Type 2 diabetes mellitus with diabetic polyneuropathy: Secondary | ICD-10-CM | POA: Diagnosis not present

## 2022-11-07 DIAGNOSIS — E1122 Type 2 diabetes mellitus with diabetic chronic kidney disease: Secondary | ICD-10-CM | POA: Diagnosis not present

## 2022-11-07 DIAGNOSIS — U071 COVID-19: Secondary | ICD-10-CM | POA: Diagnosis not present

## 2022-11-07 DIAGNOSIS — I69322 Dysarthria following cerebral infarction: Secondary | ICD-10-CM | POA: Diagnosis not present

## 2022-11-07 DIAGNOSIS — N186 End stage renal disease: Secondary | ICD-10-CM | POA: Diagnosis not present

## 2022-11-07 DIAGNOSIS — I10 Essential (primary) hypertension: Secondary | ICD-10-CM | POA: Diagnosis not present

## 2022-11-07 DIAGNOSIS — K117 Disturbances of salivary secretion: Secondary | ICD-10-CM | POA: Diagnosis not present

## 2022-11-07 DIAGNOSIS — M6281 Muscle weakness (generalized): Secondary | ICD-10-CM | POA: Diagnosis not present

## 2022-11-07 DIAGNOSIS — I5022 Chronic systolic (congestive) heart failure: Secondary | ICD-10-CM | POA: Diagnosis not present

## 2022-11-07 DIAGNOSIS — I69392 Facial weakness following cerebral infarction: Secondary | ICD-10-CM | POA: Diagnosis not present

## 2022-11-07 DIAGNOSIS — I69354 Hemiplegia and hemiparesis following cerebral infarction affecting left non-dominant side: Secondary | ICD-10-CM | POA: Diagnosis not present

## 2022-11-07 DIAGNOSIS — L89312 Pressure ulcer of right buttock, stage 2: Secondary | ICD-10-CM | POA: Diagnosis not present

## 2022-11-07 DIAGNOSIS — Z7902 Long term (current) use of antithrombotics/antiplatelets: Secondary | ICD-10-CM | POA: Diagnosis not present

## 2022-11-07 DIAGNOSIS — I132 Hypertensive heart and chronic kidney disease with heart failure and with stage 5 chronic kidney disease, or end stage renal disease: Secondary | ICD-10-CM | POA: Diagnosis not present

## 2022-11-07 DIAGNOSIS — I69391 Dysphagia following cerebral infarction: Secondary | ICD-10-CM | POA: Diagnosis not present

## 2022-11-07 DIAGNOSIS — J209 Acute bronchitis, unspecified: Secondary | ICD-10-CM | POA: Diagnosis not present

## 2022-11-07 DIAGNOSIS — L89614 Pressure ulcer of right heel, stage 4: Secondary | ICD-10-CM | POA: Diagnosis not present

## 2022-11-07 DIAGNOSIS — Z8673 Personal history of transient ischemic attack (TIA), and cerebral infarction without residual deficits: Secondary | ICD-10-CM | POA: Diagnosis not present

## 2022-11-07 DIAGNOSIS — L89211 Pressure ulcer of right hip, stage 1: Secondary | ICD-10-CM | POA: Diagnosis not present

## 2022-11-07 DIAGNOSIS — Z992 Dependence on renal dialysis: Secondary | ICD-10-CM | POA: Diagnosis not present

## 2022-11-07 DIAGNOSIS — J9601 Acute respiratory failure with hypoxia: Secondary | ICD-10-CM | POA: Diagnosis not present

## 2022-11-08 DIAGNOSIS — R600 Localized edema: Secondary | ICD-10-CM | POA: Diagnosis not present

## 2022-11-08 DIAGNOSIS — E876 Hypokalemia: Secondary | ICD-10-CM | POA: Diagnosis not present

## 2022-11-08 DIAGNOSIS — N1832 Chronic kidney disease, stage 3b: Secondary | ICD-10-CM | POA: Diagnosis not present

## 2022-11-08 DIAGNOSIS — D631 Anemia in chronic kidney disease: Secondary | ICD-10-CM | POA: Diagnosis not present

## 2022-11-10 ENCOUNTER — Telehealth: Payer: Self-pay | Admitting: Family Medicine

## 2022-11-10 ENCOUNTER — Other Ambulatory Visit: Payer: Self-pay

## 2022-11-10 ENCOUNTER — Telehealth: Payer: Self-pay | Admitting: *Deleted

## 2022-11-10 DIAGNOSIS — N183 Chronic kidney disease, stage 3 unspecified: Secondary | ICD-10-CM

## 2022-11-10 DIAGNOSIS — Z7902 Long term (current) use of antithrombotics/antiplatelets: Secondary | ICD-10-CM | POA: Diagnosis not present

## 2022-11-10 DIAGNOSIS — I5022 Chronic systolic (congestive) heart failure: Secondary | ICD-10-CM | POA: Diagnosis not present

## 2022-11-10 DIAGNOSIS — L89312 Pressure ulcer of right buttock, stage 2: Secondary | ICD-10-CM | POA: Diagnosis not present

## 2022-11-10 DIAGNOSIS — U071 COVID-19: Secondary | ICD-10-CM | POA: Diagnosis not present

## 2022-11-10 DIAGNOSIS — N186 End stage renal disease: Secondary | ICD-10-CM | POA: Diagnosis not present

## 2022-11-10 DIAGNOSIS — J209 Acute bronchitis, unspecified: Secondary | ICD-10-CM | POA: Diagnosis not present

## 2022-11-10 DIAGNOSIS — M6281 Muscle weakness (generalized): Secondary | ICD-10-CM | POA: Diagnosis not present

## 2022-11-10 DIAGNOSIS — I132 Hypertensive heart and chronic kidney disease with heart failure and with stage 5 chronic kidney disease, or end stage renal disease: Secondary | ICD-10-CM | POA: Diagnosis not present

## 2022-11-10 DIAGNOSIS — I69391 Dysphagia following cerebral infarction: Secondary | ICD-10-CM | POA: Diagnosis not present

## 2022-11-10 DIAGNOSIS — L89614 Pressure ulcer of right heel, stage 4: Secondary | ICD-10-CM | POA: Diagnosis not present

## 2022-11-10 DIAGNOSIS — I69322 Dysarthria following cerebral infarction: Secondary | ICD-10-CM | POA: Diagnosis not present

## 2022-11-10 DIAGNOSIS — Z992 Dependence on renal dialysis: Secondary | ICD-10-CM | POA: Diagnosis not present

## 2022-11-10 DIAGNOSIS — E1122 Type 2 diabetes mellitus with diabetic chronic kidney disease: Secondary | ICD-10-CM | POA: Diagnosis not present

## 2022-11-10 DIAGNOSIS — J9601 Acute respiratory failure with hypoxia: Secondary | ICD-10-CM | POA: Diagnosis not present

## 2022-11-10 DIAGNOSIS — I10 Essential (primary) hypertension: Secondary | ICD-10-CM | POA: Diagnosis not present

## 2022-11-10 DIAGNOSIS — I69392 Facial weakness following cerebral infarction: Secondary | ICD-10-CM | POA: Diagnosis not present

## 2022-11-10 DIAGNOSIS — I69354 Hemiplegia and hemiparesis following cerebral infarction affecting left non-dominant side: Secondary | ICD-10-CM | POA: Diagnosis not present

## 2022-11-10 DIAGNOSIS — D472 Monoclonal gammopathy: Secondary | ICD-10-CM | POA: Diagnosis not present

## 2022-11-10 DIAGNOSIS — E1142 Type 2 diabetes mellitus with diabetic polyneuropathy: Secondary | ICD-10-CM | POA: Diagnosis not present

## 2022-11-10 NOTE — Telephone Encounter (Signed)
Called and spoke to [facility], informed her that Dr. Tasia Catchings has reviewed the labwork and no platelet/ blood transfusion is needed. She will need to re-establish care, but if pt is bleeding or feels too weak to go to ER. She verbalized understanding. She also states that neurologist has taken Mrs. Disch off of plavix and aspirin and asked her to have pt follow up with Dr. Tasia Catchings.   Please schedule pt next week and contact [name] with appt details (prefers end of the week).   Lab/MD/ retacrit

## 2022-11-10 NOTE — Telephone Encounter (Signed)
Call from grand daughter stating that patient needs to come in today for a transfusion as per directed by Nephrologist and neurologist both. Please advise  CBC w/auto Differential (5 Part) Order: [id number] Component Ref Range & Units 3 d ago  WBC (White Blood Cell Count) 4.1 - 10.2 10^3/uL 4.9  RBC (Red Blood Cell Count) 4.04 - 5.48 10^6/uL 3.01 Low   Hemoglobin 12.0 - 15.0 gm/dL 7.8 Low   Hematocrit 35.0 - 47.0 % 25.3 Low   MCV (Mean Corpuscular Volume) 80.0 - 100.0 fl 84.1  MCH (Mean Corpuscular Hemoglobin) 27.0 - 31.2 pg 25.9 Low   MCHC (Mean Corpuscular Hemoglobin Concentration) 32.0 - 36.0 gm/dL 30.8 Low   Platelet Count 150 - 450 10^3/uL 38 Low   Comment: Repeated - Slide reviewed.  RDW-CV (Red Cell Distribution Width) 11.6 - 14.8 % 24.6 High   MPV (Mean Platelet Volume)   Comment: Unable to report MPV  Neutrophils 1.50 - 7.80 10^3/uL 2.60  Lymphocytes 1.00 - 3.60 10^3/uL 1.05  Monocytes 0.00 - 1.50 10^3/uL 1.19  Eosinophils 0.00 - 0.55 10^3/uL 0.00  Basophils 0.00 - 0.09 10^3/uL 0.02  Neutrophil % 32.0 - 70.0 % 53.0  Lymphocyte % 10.0 - 50.0 % 21.4  Monocyte % 4.0 - 13.0 % 24.2 High   Eosinophil % 1.0 - 5.0 % 0.0 Low   Basophil% 0.0 - 2.0 % 0.4  Immature Granulocyte % <=0.7 % 1.0 High   Immature Granulocyte Count <=0.06 10^3/L 0.05  Resulting Agency [facility] - LAB   Specimen Collected: 11/07/22 11:51   Performed by: Ailey: 11/07/22 15:03  Received From: [place]  Result Received: 11/09/22 15:41

## 2022-11-10 NOTE — Telephone Encounter (Signed)
Home Health Verbal Orders - Caller/Agency: Philis Nettle with [facility] Number: [phone]  Requesting OT/PT/Skilled Nursing/Social Work/Speech Therapy: OT  ADL theraputic exercises and activity, transfers to shower and toilet  Frequency: 1 time a week for 7 weeks

## 2022-11-11 DIAGNOSIS — E1122 Type 2 diabetes mellitus with diabetic chronic kidney disease: Secondary | ICD-10-CM | POA: Diagnosis not present

## 2022-11-11 DIAGNOSIS — I132 Hypertensive heart and chronic kidney disease with heart failure and with stage 5 chronic kidney disease, or end stage renal disease: Secondary | ICD-10-CM | POA: Diagnosis not present

## 2022-11-11 DIAGNOSIS — D472 Monoclonal gammopathy: Secondary | ICD-10-CM | POA: Diagnosis not present

## 2022-11-11 DIAGNOSIS — J9601 Acute respiratory failure with hypoxia: Secondary | ICD-10-CM | POA: Diagnosis not present

## 2022-11-11 DIAGNOSIS — I69392 Facial weakness following cerebral infarction: Secondary | ICD-10-CM | POA: Diagnosis not present

## 2022-11-11 DIAGNOSIS — I69391 Dysphagia following cerebral infarction: Secondary | ICD-10-CM | POA: Diagnosis not present

## 2022-11-11 DIAGNOSIS — N186 End stage renal disease: Secondary | ICD-10-CM | POA: Diagnosis not present

## 2022-11-11 DIAGNOSIS — E1142 Type 2 diabetes mellitus with diabetic polyneuropathy: Secondary | ICD-10-CM | POA: Diagnosis not present

## 2022-11-11 DIAGNOSIS — I5022 Chronic systolic (congestive) heart failure: Secondary | ICD-10-CM | POA: Diagnosis not present

## 2022-11-11 DIAGNOSIS — I69322 Dysarthria following cerebral infarction: Secondary | ICD-10-CM | POA: Diagnosis not present

## 2022-11-11 DIAGNOSIS — L89312 Pressure ulcer of right buttock, stage 2: Secondary | ICD-10-CM | POA: Diagnosis not present

## 2022-11-11 DIAGNOSIS — U071 COVID-19: Secondary | ICD-10-CM | POA: Diagnosis not present

## 2022-11-11 DIAGNOSIS — I69354 Hemiplegia and hemiparesis following cerebral infarction affecting left non-dominant side: Secondary | ICD-10-CM | POA: Diagnosis not present

## 2022-11-11 DIAGNOSIS — J209 Acute bronchitis, unspecified: Secondary | ICD-10-CM | POA: Diagnosis not present

## 2022-11-11 DIAGNOSIS — Z992 Dependence on renal dialysis: Secondary | ICD-10-CM | POA: Diagnosis not present

## 2022-11-11 DIAGNOSIS — M6281 Muscle weakness (generalized): Secondary | ICD-10-CM | POA: Diagnosis not present

## 2022-11-11 DIAGNOSIS — I10 Essential (primary) hypertension: Secondary | ICD-10-CM | POA: Diagnosis not present

## 2022-11-11 DIAGNOSIS — Z7902 Long term (current) use of antithrombotics/antiplatelets: Secondary | ICD-10-CM | POA: Diagnosis not present

## 2022-11-11 DIAGNOSIS — L89614 Pressure ulcer of right heel, stage 4: Secondary | ICD-10-CM | POA: Diagnosis not present

## 2022-11-11 NOTE — Telephone Encounter (Signed)
OK for verbals 

## 2022-11-11 NOTE — Telephone Encounter (Signed)
Advised of approved orders.

## 2022-11-14 ENCOUNTER — Telehealth: Payer: Self-pay

## 2022-11-14 DIAGNOSIS — S31819A Unspecified open wound of right buttock, initial encounter: Secondary | ICD-10-CM

## 2022-11-14 DIAGNOSIS — S31829A Unspecified open wound of left buttock, initial encounter: Secondary | ICD-10-CM

## 2022-11-14 DIAGNOSIS — T148XXA Other injury of unspecified body region, initial encounter: Secondary | ICD-10-CM

## 2022-11-14 DIAGNOSIS — L89891 Pressure ulcer of other site, stage 1: Secondary | ICD-10-CM

## 2022-11-14 DIAGNOSIS — L89899 Pressure ulcer of other site, unspecified stage: Secondary | ICD-10-CM

## 2022-11-14 NOTE — Telephone Encounter (Signed)
Copied from [place] [id number] [id number]. Topic: Referral - Request for Referral >> Nov 14, 2022  4:06 PM Gabriela [name] wrote: Has patient seen PCP for this complaint? No  Referral for which specialty: Wound Clinic Preferred provider/office: unknown Reason for referral: Gabriela [name] from [facility] called stated patient has several wounds on her body. 2 on each buttocks one on right outside foot, right heel and left outside foot.

## 2022-11-15 ENCOUNTER — Other Ambulatory Visit: Payer: Self-pay | Admitting: Oncology

## 2022-11-15 DIAGNOSIS — L89899 Pressure ulcer of other site, unspecified stage: Secondary | ICD-10-CM | POA: Insufficient documentation

## 2022-11-15 DIAGNOSIS — I69322 Dysarthria following cerebral infarction: Secondary | ICD-10-CM | POA: Diagnosis not present

## 2022-11-15 DIAGNOSIS — J209 Acute bronchitis, unspecified: Secondary | ICD-10-CM | POA: Diagnosis not present

## 2022-11-15 DIAGNOSIS — U071 COVID-19: Secondary | ICD-10-CM | POA: Diagnosis not present

## 2022-11-15 DIAGNOSIS — D472 Monoclonal gammopathy: Secondary | ICD-10-CM | POA: Diagnosis not present

## 2022-11-15 DIAGNOSIS — L89312 Pressure ulcer of right buttock, stage 2: Secondary | ICD-10-CM | POA: Diagnosis not present

## 2022-11-15 DIAGNOSIS — I10 Essential (primary) hypertension: Secondary | ICD-10-CM | POA: Diagnosis not present

## 2022-11-15 DIAGNOSIS — Z992 Dependence on renal dialysis: Secondary | ICD-10-CM | POA: Diagnosis not present

## 2022-11-15 DIAGNOSIS — E1142 Type 2 diabetes mellitus with diabetic polyneuropathy: Secondary | ICD-10-CM | POA: Diagnosis not present

## 2022-11-15 DIAGNOSIS — S31819A Unspecified open wound of right buttock, initial encounter: Secondary | ICD-10-CM | POA: Insufficient documentation

## 2022-11-15 DIAGNOSIS — M6281 Muscle weakness (generalized): Secondary | ICD-10-CM | POA: Diagnosis not present

## 2022-11-15 DIAGNOSIS — Z7902 Long term (current) use of antithrombotics/antiplatelets: Secondary | ICD-10-CM | POA: Diagnosis not present

## 2022-11-15 DIAGNOSIS — I132 Hypertensive heart and chronic kidney disease with heart failure and with stage 5 chronic kidney disease, or end stage renal disease: Secondary | ICD-10-CM | POA: Diagnosis not present

## 2022-11-15 DIAGNOSIS — E1122 Type 2 diabetes mellitus with diabetic chronic kidney disease: Secondary | ICD-10-CM | POA: Diagnosis not present

## 2022-11-15 DIAGNOSIS — N186 End stage renal disease: Secondary | ICD-10-CM | POA: Diagnosis not present

## 2022-11-15 DIAGNOSIS — I69391 Dysphagia following cerebral infarction: Secondary | ICD-10-CM | POA: Diagnosis not present

## 2022-11-15 DIAGNOSIS — I69392 Facial weakness following cerebral infarction: Secondary | ICD-10-CM | POA: Diagnosis not present

## 2022-11-15 DIAGNOSIS — I5022 Chronic systolic (congestive) heart failure: Secondary | ICD-10-CM | POA: Diagnosis not present

## 2022-11-15 DIAGNOSIS — J9601 Acute respiratory failure with hypoxia: Secondary | ICD-10-CM | POA: Diagnosis not present

## 2022-11-15 DIAGNOSIS — L89614 Pressure ulcer of right heel, stage 4: Secondary | ICD-10-CM | POA: Diagnosis not present

## 2022-11-15 DIAGNOSIS — S31829A Unspecified open wound of left buttock, initial encounter: Secondary | ICD-10-CM | POA: Insufficient documentation

## 2022-11-15 DIAGNOSIS — I69354 Hemiplegia and hemiparesis following cerebral infarction affecting left non-dominant side: Secondary | ICD-10-CM | POA: Diagnosis not present

## 2022-11-15 NOTE — Telephone Encounter (Signed)
Wound care referral has been submitted urgently for wounds noted   [name], MD  [facility]

## 2022-11-15 NOTE — Addendum Note (Signed)
Addended by: Adolph Pollack on: 11/15/2022 01:11 PM   Modules accepted: Orders

## 2022-11-17 ENCOUNTER — Telehealth: Payer: Self-pay | Admitting: Family Medicine

## 2022-11-17 DIAGNOSIS — I69354 Hemiplegia and hemiparesis following cerebral infarction affecting left non-dominant side: Secondary | ICD-10-CM | POA: Diagnosis not present

## 2022-11-17 DIAGNOSIS — Z992 Dependence on renal dialysis: Secondary | ICD-10-CM | POA: Diagnosis not present

## 2022-11-17 DIAGNOSIS — U071 COVID-19: Secondary | ICD-10-CM | POA: Diagnosis not present

## 2022-11-17 DIAGNOSIS — I69322 Dysarthria following cerebral infarction: Secondary | ICD-10-CM | POA: Diagnosis not present

## 2022-11-17 DIAGNOSIS — D472 Monoclonal gammopathy: Secondary | ICD-10-CM | POA: Diagnosis not present

## 2022-11-17 DIAGNOSIS — I132 Hypertensive heart and chronic kidney disease with heart failure and with stage 5 chronic kidney disease, or end stage renal disease: Secondary | ICD-10-CM | POA: Diagnosis not present

## 2022-11-17 DIAGNOSIS — L89312 Pressure ulcer of right buttock, stage 2: Secondary | ICD-10-CM | POA: Diagnosis not present

## 2022-11-17 DIAGNOSIS — I69391 Dysphagia following cerebral infarction: Secondary | ICD-10-CM | POA: Diagnosis not present

## 2022-11-17 DIAGNOSIS — L89614 Pressure ulcer of right heel, stage 4: Secondary | ICD-10-CM | POA: Diagnosis not present

## 2022-11-17 DIAGNOSIS — N186 End stage renal disease: Secondary | ICD-10-CM | POA: Diagnosis not present

## 2022-11-17 DIAGNOSIS — I10 Essential (primary) hypertension: Secondary | ICD-10-CM | POA: Diagnosis not present

## 2022-11-17 DIAGNOSIS — E1142 Type 2 diabetes mellitus with diabetic polyneuropathy: Secondary | ICD-10-CM | POA: Diagnosis not present

## 2022-11-17 DIAGNOSIS — M6281 Muscle weakness (generalized): Secondary | ICD-10-CM | POA: Diagnosis not present

## 2022-11-17 DIAGNOSIS — E1122 Type 2 diabetes mellitus with diabetic chronic kidney disease: Secondary | ICD-10-CM | POA: Diagnosis not present

## 2022-11-17 DIAGNOSIS — Z7902 Long term (current) use of antithrombotics/antiplatelets: Secondary | ICD-10-CM | POA: Diagnosis not present

## 2022-11-17 DIAGNOSIS — I5022 Chronic systolic (congestive) heart failure: Secondary | ICD-10-CM | POA: Diagnosis not present

## 2022-11-17 DIAGNOSIS — I69392 Facial weakness following cerebral infarction: Secondary | ICD-10-CM | POA: Diagnosis not present

## 2022-11-17 DIAGNOSIS — J9601 Acute respiratory failure with hypoxia: Secondary | ICD-10-CM | POA: Diagnosis not present

## 2022-11-17 DIAGNOSIS — J209 Acute bronchitis, unspecified: Secondary | ICD-10-CM | POA: Diagnosis not present

## 2022-11-17 NOTE — Telephone Encounter (Signed)
Called patient to schedule Medicare Annual Wellness Visit (AWV). Left message for patient to call back and schedule Medicare Annual Wellness Visit (AWV).  Last date of AWV: 09/07/2021  Please schedule an appointment at any time with [facility].  If any questions, please contact me at [phone].  Thank you ,  Bernice Cicero Care Guide [facility] AWV TEAM Direct Dial: [phone]     

## 2022-11-18 ENCOUNTER — Telehealth: Payer: Self-pay

## 2022-11-18 ENCOUNTER — Inpatient Hospital Stay: Payer: 59 | Attending: Oncology

## 2022-11-18 ENCOUNTER — Inpatient Hospital Stay (HOSPITAL_BASED_OUTPATIENT_CLINIC_OR_DEPARTMENT_OTHER): Payer: 59 | Admitting: Oncology

## 2022-11-18 ENCOUNTER — Encounter: Payer: Self-pay | Admitting: Oncology

## 2022-11-18 ENCOUNTER — Inpatient Hospital Stay: Payer: 59

## 2022-11-18 VITALS — BP 128/59 | HR 69 | Temp 96.3°F | Resp 18

## 2022-11-18 DIAGNOSIS — D696 Thrombocytopenia, unspecified: Secondary | ICD-10-CM | POA: Insufficient documentation

## 2022-11-18 DIAGNOSIS — D638 Anemia in other chronic diseases classified elsewhere: Secondary | ICD-10-CM

## 2022-11-18 DIAGNOSIS — N183 Chronic kidney disease, stage 3 unspecified: Secondary | ICD-10-CM

## 2022-11-18 DIAGNOSIS — D631 Anemia in chronic kidney disease: Secondary | ICD-10-CM

## 2022-11-18 DIAGNOSIS — L89619 Pressure ulcer of right heel, unspecified stage: Secondary | ICD-10-CM | POA: Diagnosis not present

## 2022-11-18 DIAGNOSIS — N189 Chronic kidney disease, unspecified: Secondary | ICD-10-CM | POA: Diagnosis not present

## 2022-11-18 DIAGNOSIS — D472 Monoclonal gammopathy: Secondary | ICD-10-CM | POA: Diagnosis not present

## 2022-11-18 LAB — FERRITIN: Ferritin: 819 ng/mL — ABNORMAL HIGH (ref 11–307)

## 2022-11-18 LAB — CMP (CANCER CENTER ONLY)
ALT: 8 U/L (ref 0–44)
AST: 20 U/L (ref 15–41)
Albumin: 2.7 g/dL — ABNORMAL LOW (ref 3.5–5.0)
Alkaline Phosphatase: 75 U/L (ref 38–126)
Anion gap: 7 (ref 5–15)
BUN: 32 mg/dL — ABNORMAL HIGH (ref 8–23)
CO2: 26 mmol/L (ref 22–32)
Calcium: 8.1 mg/dL — ABNORMAL LOW (ref 8.9–10.3)
Chloride: 107 mmol/L (ref 98–111)
Creatinine: 1.35 mg/dL — ABNORMAL HIGH (ref 0.44–1.00)
GFR, Estimated: 36 mL/min — ABNORMAL LOW (ref >60)
Glucose, Bld: 115 mg/dL — ABNORMAL HIGH (ref 70–99)
Potassium: 3.5 mmol/L (ref 3.5–5.1)
Sodium: 140 mmol/L (ref 135–145)
Total Bilirubin: 1.2 mg/dL (ref 0.3–1.2)
Total Protein: 7 g/dL (ref 6.5–8.1)

## 2022-11-18 LAB — IRON AND TIBC
Iron: 35 ug/dL (ref 28–170)
Saturation Ratios: 23 % (ref 10.4–31.8)
TIBC: 151 ug/dL — ABNORMAL LOW (ref 250–450)
UIBC: 116 ug/dL

## 2022-11-18 LAB — CBC WITH DIFFERENTIAL (CANCER CENTER ONLY)
Abs Immature Granulocytes: 0.23 10*3/uL — ABNORMAL HIGH (ref 0.00–0.07)
Basophils Absolute: 0 10*3/uL (ref 0.0–0.1)
Basophils Relative: 1 %
Eosinophils Absolute: 0 10*3/uL (ref 0.0–0.5)
Eosinophils Relative: 0 %
HCT: 25.4 % — ABNORMAL LOW (ref 36.0–46.0)
Hemoglobin: 7.7 g/dL — ABNORMAL LOW (ref 12.0–15.0)
Immature Granulocytes: 5 %
Lymphocytes Relative: 23 %
Lymphs Abs: 1.1 10*3/uL (ref 0.7–4.0)
MCH: 25.8 pg — ABNORMAL LOW (ref 26.0–34.0)
MCHC: 30.3 g/dL (ref 30.0–36.0)
MCV: 85.2 fL (ref 80.0–100.0)
Monocytes Absolute: 0.8 10*3/uL (ref 0.1–1.0)
Monocytes Relative: 17 %
Neutro Abs: 2.6 10*3/uL (ref 1.7–7.7)
Neutrophils Relative %: 54 %
Platelet Count: 94 10*3/uL — ABNORMAL LOW (ref 150–400)
RBC: 2.98 MIL/uL — ABNORMAL LOW (ref 3.87–5.11)
RDW: 24.3 % — ABNORMAL HIGH (ref 11.5–15.5)
WBC Count: 4.8 10*3/uL (ref 4.0–10.5)
nRBC: 0 % (ref 0.0–0.2)

## 2022-11-18 LAB — SAMPLE TO BLOOD BANK

## 2022-11-18 MED ORDER — EPOETIN ALFA-EPBX 10000 UNIT/ML IJ SOLN
30000.0000 [IU] | Freq: Once | INTRAMUSCULAR | Status: AC
  Administered 2022-11-18: 30000 [IU] via SUBCUTANEOUS
  Filled 2022-11-18: qty 3

## 2022-11-18 NOTE — Telephone Encounter (Signed)
-----   Message from Rickard Patience, MD sent at 11/18/2022  4:05 PM EDT ----- Please let patient and family know that her iron level is adequate.  No need for IV iron treatments. Recommend follow-up 2 weeks lab +/- Retacrit 4 weeks lab MD +/- Retacrit.  All labs are ordered.  Thank you

## 2022-11-18 NOTE — Assessment & Plan Note (Signed)
Labs reviewed and discussed with patient and her son.  Rationale and potential side effects of erythropoietin replacement therapy was reviewed and discussed with patient and her son.  They agree with the plan. Proceed with Retacrit 30,000 units today. Iron panel showed ferritin level is at goal, >200

## 2022-11-18 NOTE — Progress Notes (Signed)
Hematology/Oncology Progress note [phone] [phone]) [phone]     CHIEF COMPLAINTS/REASON FOR VISIT:  Evaluation of anemia  ASSESSMENT & PLAN:   Anemia in chronic kidney disease (CKD) Labs reviewed and discussed with patient and her son.  Rationale and potential side effects of erythropoietin replacement therapy was reviewed and discussed with patient and her son.  They agree with the plan. Proceed with Retacrit 30,000 units today. Iron panel showed ferritin level is at goal, >200   MGUS (monoclonal gammopathy of unknown significance) I discussed with patient about the diagnosis of IgG MGUS which is an asymptomatic condition which has a small risk of progression to smoldering multiple myeloma and to symptomatic multiple myeloma. Less frequently, these patients progress to AL amyloidosis, light chain deposition disease, or another lymphoproliferative disorder. Repeat multiple myeloma and light chain ratio today.  Thrombocytopenia Platelet count has improved.  Okay to resume aspirin.   No orders of the defined types were placed in this encounter.  Follow up in 4 weeks.   All questions were answered. The patient knows to call the clinic with any problems, questions or concerns.  Gabriela PatienceZhou Gabriela Carico, MD, PhD [facility] 11/18/2022    HISTORY OF PRESENTING ILLNESS:  Gabriela [name] is a  89 y.o.  female with PMH listed below who was referred to me for evaluation of anemia Reviewed patient's recent labs that was done.  Labs revealed anemia with hemoglobin of 9, mcv 80.  .   Reviewed patient's previous labs ordered by primary care physician's office, anemia is chronic onset , duration is since at least 2014 with baseline 8-9s.  Patient was admitted from 10/18/20-10/22/20 due to cholangitis/choledocholithiasis/gallstone pancreatitis/acute anemia Patient is status post ERCP with a stent to the common bile duct.  Patient was treated with antibiotics. Her  hemoglobin dropped to 6.8 and was transfused 1 unit of PRBC.  Patient was referred to establish care with hematology for further evaluation of anemia and management. Patient's chart says patient has a legal guardian.  Patient was accompanied by her granddaughter Asencion IslamMarva who is not able to come to the examination room because she brought her kit today and the cancer center policy does not allow children in the cancer center.Asencion IslamMarva has to stay downstairs with her gait.  Associated signs and symptoms: Patient reports fatigue. deneis SOB with exertion.  Denies weight loss, easy bruising, hematochezia, hemoptysis, hematuria.  Patient denies any pain, fever or chills.   INTERVAL HISTORY Gabriela GougeLillie M [name] is a 89 y.o. female who has above history reviewed by me today presents to establish care for anemia secondary to chronic kidney disease, MGUS.   Patient was last seen by me in 2022 and after that she lost to follow-up. Chronic fatigue at baseline.  She recently had blood work done with her nephrologist was found to have hemoglobin 7.8.  Patient was recommended to reestablish care. Patient was accompanied by her son [name]  Reports feeling tired and fatigued.  Denies any bleeding events.   Review of Systems  Constitutional:  Positive for fatigue. Negative for appetite change, chills and fever.  HENT:   Negative for hearing loss and voice change.   Eyes:  Negative for eye problems.  Respiratory:  Negative for chest tightness and cough.   Cardiovascular:  Negative for chest pain.  Gastrointestinal:  Negative for abdominal distention, abdominal pain and blood in stool.  Endocrine: Negative for hot flashes.  Genitourinary:  Negative for difficulty urinating and frequency.   Musculoskeletal:  Negative for  arthralgias.  Skin:  Negative for itching and rash.  Neurological:  Negative for extremity weakness.  Hematological:  Negative for adenopathy.  Psychiatric/Behavioral:  Negative for confusion.       MEDICAL HISTORY:  Past Medical History:  Diagnosis Date   Acute stroke due to ischemia 09/23/2022   Arthritis    Diabetes mellitus    Glaucoma    Hyperlipidemia    Hypertension    Shortness of breath    with exertion    SURGICAL HISTORY: Past Surgical History:  Procedure Laterality Date   CARDIAC CATHETERIZATION  04/22/13   CHOLECYSTECTOMY     CORONARY ARTERY BYPASS GRAFT N/A 05/31/2013   Procedure: Coronary Artery Bypass Grafting Times Three Using Left Internal Mammary Artery and Right Saphenous Leg Vein Harvested Endoscopically;  Surgeon: Kerin Perna, MD;  Location: [facility] OR;  Service: Open Heart Surgery;  Laterality: N/A;   ERCP N/A 10/20/2020   Procedure: ENDOSCOPIC RETROGRADE CHOLANGIOPANCREATOGRAPHY (ERCP);  Surgeon: Midge Minium, MD;  Location: [facility] ENDOSCOPY;  Service: Endoscopy;  Laterality: N/A;   ERCP N/A 12/22/2020   Procedure: ENDOSCOPIC RETROGRADE CHOLANGIOPANCREATOGRAPHY (ERCP);  Surgeon: Midge Minium, MD;  Location: [facility] ENDOSCOPY;  Service: Endoscopy;  Laterality: N/A;   EYE SURGERY     HIP SURGERY     right   INTRAOPERATIVE TRANSESOPHAGEAL ECHOCARDIOGRAM N/A 05/31/2013   Procedure: INTRAOPERATIVE TRANSESOPHAGEAL ECHOCARDIOGRAM;  Surgeon: Kerin Perna, MD;  Location: [facility] OR;  Service: Open Heart Surgery;  Laterality: N/A;   JOINT REPLACEMENT     MITRAL VALVE REPAIR N/A 05/31/2013   Procedure: MITRAL VALVE REPAIR (MVR);  Surgeon: Kerin Perna, MD;  Location: [facility] OR;  Service: Open Heart Surgery;  Laterality: N/A;   ORIF FEMUR FRACTURE Bilateral 05/26/2022   Procedure: OPEN REDUCTION INTERNAL FIXATION (ORIF) DISTAL FEMUR FRACTURE;  Surgeon: Myrene Galas, MD;  Location: [facility] OR;  Service: Orthopedics;  Laterality: Bilateral;   TONSILLECTOMY      SOCIAL HISTORY: Social History   Socioeconomic History   Marital status: Widowed    Spouse name: Not on file   Number of children: 7   Years of education: Not on file   Highest education level: 11th grade   Occupational History   Occupation: retired  Tobacco Use   Smoking status: Never   Smokeless tobacco: Never  Vaping Use   Vaping Use: Never used  Substance and Sexual Activity   Alcohol use: No   Drug use: No   Sexual activity: Never  Other Topics Concern   Not on file  Social History Narrative   Not on file   Social Determinants of Health   Financial Resource Strain: Low Risk  (09/07/2021)   Overall Financial Resource Strain (CARDIA)    Difficulty of Paying Living Expenses: Not hard at all  Food Insecurity: No Food Insecurity (10/01/2022)   Hunger Vital Sign    Worried About Running Out of Food in the Last Year: Never true    Ran Out of Food in the Last Year: Never true  Transportation Needs: No Transportation Needs (10/01/2022)   PRAPARE - Administrator, Civil Service (Medical): No    Lack of Transportation (Non-Medical): No  Physical Activity: Inactive (09/07/2021)   Exercise Vital Sign    Days of Exercise per Week: 0 days    Minutes of Exercise per Session: 0 min  Stress: No Stress Concern Present (09/07/2021)   [employer] of Occupational Health - Occupational Stress Questionnaire    Feeling of Stress : Not at  all  Social Connections: Moderately Isolated (09/07/2021)   Social Connection and Isolation Panel [NHANES]    Frequency of Communication with Friends and Family: More than three times a week    Frequency of Social Gatherings with Friends and Family: More than three times a week    Attends Religious Services: More than 4 times per year    Active Member of [employer] or Organizations: No    Attends Banker Meetings: Not on file    Marital Status: Widowed  Intimate Partner Violence: Not At Risk (10/01/2022)   Humiliation, Afraid, Rape, and Kick questionnaire    Fear of Current or Ex-Partner: No    Emotionally Abused: No    Physically Abused: No    Sexually Abused: No    FAMILY HISTORY: Family History  Problem Relation Age of Onset    Diabetes Sister    Hypertension Mother     ALLERGIES:  is allergic to oxycodone, prednisone, and sulfa antibiotics.  MEDICATIONS:  Current Outpatient Medications  Medication Sig Dispense Refill   acetaminophen (TYLENOL) 325 MG tablet Take 2 tablets (650 mg total) by mouth 3 (three) times daily. Wean as able 200 tablet 0   albuterol (VENTOLIN HFA) 108 (90 Base) MCG/ACT inhaler Inhale 2 puffs into the lungs every 4 (four) hours as needed for wheezing or shortness of breath.     atorvastatin (LIPITOR) 20 MG tablet Take 1 tablet (20 mg total) by mouth daily. 90 tablet 3   clopidogrel (PLAVIX) 75 MG tablet Take 1 tablet (75 mg total) by mouth daily. 30 tablet 6   diclofenac Sodium (VOLTAREN) 1 % GEL Apply 2 g topically 4 (four) times daily. 400 g 0   ferrous sulfate 325 (65 FE) MG tablet Take 1 tablet (325 mg total) by mouth daily with breakfast. 90 tablet 2   furosemide (LASIX) 20 MG tablet Take 20 mg by mouth daily.     ipratropium (ATROVENT HFA) 17 MCG/ACT inhaler Inhale 2 puffs into the lungs every 4 (four) hours as needed for wheezing. 1 each 12   ipratropium-albuterol (DUONEB) 0.5-2.5 (3) MG/3ML SOLN Take 3 mLs by nebulization every 4 (four) hours as needed. 360 mL 4   KLOR-CON M20 20 MEQ tablet Take 20 mEq by mouth daily.     methocarbamol (ROBAXIN) 500 MG tablet Take 1 tablet (500 mg total) by mouth every 6 (six) hours as needed for muscle spasms. 30 tablet 0   metoprolol succinate (TOPROL-XL) 25 MG 24 hr tablet Take 0.5 tablets (12.5 mg total) by mouth daily. 90 tablet 1   Multiple Vitamin (MULTIVITAMIN WITH MINERALS) TABS tablet Take 1 tablet by mouth daily.     SIMBRINZA 1-0.2 % SUSP Place 1 drop into both eyes 2 (two) times daily.     sodium bicarbonate 650 MG tablet Take 650 mg by mouth 2 (two) times daily.     timolol (TIMOPTIC) 0.5 % ophthalmic solution Place 1 drop into both eyes 2 (two) times daily.     traMADol (ULTRAM) 50 MG tablet Take 1 tablet (50 mg total) by mouth every 8  (eight) hours as needed. 12 tablet 0   Vitamin D, Ergocalciferol, (DRISDOL) 1.25 MG (50000 UNIT) CAPS capsule Take 1 capsule (50,000 Units total) by mouth every 7 (seven) days. 5 capsule 0   calcium carbonate (TUMS - DOSED IN MG ELEMENTAL CALCIUM) 500 MG chewable tablet Chew 2 tablets (400 mg of elemental calcium total) by mouth daily. (Patient not taking: Reported on 10/26/2022) 100 tablet 0  loratadine (CLARITIN) 10 MG tablet TAKE 1 TABLET BY MOUTH EVERY DAY (Patient not taking: Reported on 10/26/2022) 90 tablet 1   No current facility-administered medications for this visit.     PHYSICAL EXAMINATION: ECOG PERFORMANCE STATUS: 2 - Symptomatic, <50% confined to bed Vitals:   11/18/22 1035  BP: (!) 128/59  Pulse: 69  Resp: 18  Temp: (!) 96.3 F (35.7 C)  SpO2: 96%   There were no vitals filed for this visit.   Physical Exam Constitutional:      General: She is not in acute distress.    Comments: Patient sits in a wheelchair today.  HENT:     Head: Normocephalic and atraumatic.  Eyes:     General: No scleral icterus. Cardiovascular:     Rate and Rhythm: Normal rate.  Pulmonary:     Effort: Pulmonary effort is normal. No respiratory distress.     Breath sounds: No wheezing.  Abdominal:     General: Bowel sounds are normal. There is no distension.     Palpations: Abdomen is soft.  Musculoskeletal:        General: No deformity. Normal range of motion.     Cervical back: Normal range of motion and neck supple.  Skin:    General: Skin is warm and dry.     Findings: No erythema or rash.  Neurological:     Mental Status: She is alert. Mental status is at baseline.     Cranial Nerves: No cranial nerve deficit.     Comments: Orientated x 2  Psychiatric:        Mood and Affect: Mood normal.      LABORATORY DATA:  I have reviewed the data as listed Lab Results  Component Value Date   WBC 4.8 11/18/2022   HGB 7.7 (L) 11/18/2022   HCT 25.4 (L) 11/18/2022   MCV 85.2  11/18/2022   PLT 94 (L) 11/18/2022   Recent Labs    08/18/22 1526 09/21/22 2033 09/22/22 0255 09/28/22 0731 10/03/22 0807 11/18/22 0943  NA 145* 142   < > 145 147* 140  K 3.0* 4.2   < > 4.4 4.7 3.5  CL 111* 112*   < > 113* 115* 107  CO2 21 22   < > 25 25 26   GLUCOSE 99 140*   < > 160* 182* 115*  BUN 19 31*   < > 37* 43* 32*  CREATININE 0.95 1.57*   < > 1.11* 1.28* 1.35*  CALCIUM 8.2* 8.0*   < > 8.0* 8.1* 8.1*  GFRNONAA  --  30*   < > 46* 39* 36*  PROT 6.7 6.8  --   --   --  7.0  ALBUMIN 3.5* 2.8*  --   --   --  2.7*  AST 15 22  --   --   --  20  ALT 10 15  --   --   --  8  ALKPHOS 199* 131*  --   --   --  75  BILITOT 0.6 1.5*  --   --   --  1.2   < > = values in this interval not displayed.    Iron/TIBC/Ferritin/ %Sat    Component Value Date/Time   IRON 35 11/18/2022 0943   TIBC 151 (L) 11/18/2022 0943   FERRITIN 819 (H) 11/18/2022 0943   IRONPCTSAT 23 11/18/2022 [id number]

## 2022-11-18 NOTE — Telephone Encounter (Signed)
Called number listed Asencion Islam) and unable to reach anyone. Detailed message left informed her of MD recommendation.   Please schedule and inform pt/ [name] of appts:   Lab/retacit in 2 weeks  Lab/MD/ retacrit in 4 weeks

## 2022-11-18 NOTE — Assessment & Plan Note (Signed)
Platelet count has improved.  Okay to resume aspirin.

## 2022-11-18 NOTE — Assessment & Plan Note (Signed)
I discussed with patient about the diagnosis of IgG MGUS which is an asymptomatic condition which has a small risk of progression to smoldering multiple myeloma and to symptomatic multiple myeloma. Less frequently, these patients progress to AL amyloidosis, light chain deposition disease, or another lymphoproliferative disorder. Repeat multiple myeloma and light chain ratio today.

## 2022-11-19 DIAGNOSIS — S728X9A Other fracture of unspecified femur, initial encounter for closed fracture: Secondary | ICD-10-CM | POA: Diagnosis not present

## 2022-11-20 DIAGNOSIS — S728X9A Other fracture of unspecified femur, initial encounter for closed fracture: Secondary | ICD-10-CM | POA: Diagnosis not present

## 2022-11-20 DIAGNOSIS — T148XXA Other injury of unspecified body region, initial encounter: Secondary | ICD-10-CM | POA: Diagnosis not present

## 2022-11-21 ENCOUNTER — Encounter: Payer: Self-pay | Admitting: Oncology

## 2022-11-21 DIAGNOSIS — L89619 Pressure ulcer of right heel, unspecified stage: Secondary | ICD-10-CM | POA: Diagnosis not present

## 2022-11-21 LAB — KAPPA/LAMBDA LIGHT CHAINS
Kappa free light chain: 234.3 mg/L — ABNORMAL HIGH (ref 3.3–19.4)
Kappa, lambda light chain ratio: 0.97 (ref 0.26–1.65)
Lambda free light chains: 240.8 mg/L — ABNORMAL HIGH (ref 5.7–26.3)

## 2022-11-22 DIAGNOSIS — I5022 Chronic systolic (congestive) heart failure: Secondary | ICD-10-CM | POA: Diagnosis not present

## 2022-11-22 DIAGNOSIS — N186 End stage renal disease: Secondary | ICD-10-CM | POA: Diagnosis not present

## 2022-11-22 DIAGNOSIS — E1142 Type 2 diabetes mellitus with diabetic polyneuropathy: Secondary | ICD-10-CM | POA: Diagnosis not present

## 2022-11-22 DIAGNOSIS — I10 Essential (primary) hypertension: Secondary | ICD-10-CM | POA: Diagnosis not present

## 2022-11-22 DIAGNOSIS — I69354 Hemiplegia and hemiparesis following cerebral infarction affecting left non-dominant side: Secondary | ICD-10-CM | POA: Diagnosis not present

## 2022-11-22 DIAGNOSIS — U071 COVID-19: Secondary | ICD-10-CM | POA: Diagnosis not present

## 2022-11-22 DIAGNOSIS — L89614 Pressure ulcer of right heel, stage 4: Secondary | ICD-10-CM | POA: Diagnosis not present

## 2022-11-22 DIAGNOSIS — I69391 Dysphagia following cerebral infarction: Secondary | ICD-10-CM | POA: Diagnosis not present

## 2022-11-22 DIAGNOSIS — L89312 Pressure ulcer of right buttock, stage 2: Secondary | ICD-10-CM | POA: Diagnosis not present

## 2022-11-22 DIAGNOSIS — I69322 Dysarthria following cerebral infarction: Secondary | ICD-10-CM | POA: Diagnosis not present

## 2022-11-22 DIAGNOSIS — J9601 Acute respiratory failure with hypoxia: Secondary | ICD-10-CM | POA: Diagnosis not present

## 2022-11-22 DIAGNOSIS — I132 Hypertensive heart and chronic kidney disease with heart failure and with stage 5 chronic kidney disease, or end stage renal disease: Secondary | ICD-10-CM | POA: Diagnosis not present

## 2022-11-22 DIAGNOSIS — Z992 Dependence on renal dialysis: Secondary | ICD-10-CM | POA: Diagnosis not present

## 2022-11-22 DIAGNOSIS — I69392 Facial weakness following cerebral infarction: Secondary | ICD-10-CM | POA: Diagnosis not present

## 2022-11-22 DIAGNOSIS — D472 Monoclonal gammopathy: Secondary | ICD-10-CM | POA: Diagnosis not present

## 2022-11-22 DIAGNOSIS — E1122 Type 2 diabetes mellitus with diabetic chronic kidney disease: Secondary | ICD-10-CM | POA: Diagnosis not present

## 2022-11-22 DIAGNOSIS — M6281 Muscle weakness (generalized): Secondary | ICD-10-CM | POA: Diagnosis not present

## 2022-11-22 DIAGNOSIS — Z7902 Long term (current) use of antithrombotics/antiplatelets: Secondary | ICD-10-CM | POA: Diagnosis not present

## 2022-11-22 DIAGNOSIS — J209 Acute bronchitis, unspecified: Secondary | ICD-10-CM | POA: Diagnosis not present

## 2022-11-23 ENCOUNTER — Telehealth: Payer: Self-pay

## 2022-11-23 LAB — MULTIPLE MYELOMA PANEL, SERUM
Albumin SerPl Elph-Mcnc: 2.9 g/dL (ref 2.9–4.4)
Albumin/Glob SerPl: 0.8 (ref 0.7–1.7)
Alpha 1: 0.3 g/dL (ref 0.0–0.4)
Alpha2 Glob SerPl Elph-Mcnc: 0.4 g/dL (ref 0.4–1.0)
B-Globulin SerPl Elph-Mcnc: 0.9 g/dL (ref 0.7–1.3)
Gamma Glob SerPl Elph-Mcnc: 2.3 g/dL — ABNORMAL HIGH (ref 0.4–1.8)
Globulin, Total: 3.9 g/dL (ref 2.2–3.9)
IgA: 1034 mg/dL — ABNORMAL HIGH (ref 64–422)
IgG (Immunoglobin G), Serum: 2172 mg/dL — ABNORMAL HIGH (ref 586–1602)
IgM (Immunoglobulin M), Srm: 79 mg/dL (ref 26–217)
M Protein SerPl Elph-Mcnc: 0.5 g/dL — ABNORMAL HIGH
Total Protein ELP: 6.8 g/dL (ref 6.0–8.5)

## 2022-11-24 NOTE — Telephone Encounter (Signed)
Death certificate has been completed and certified   Ronnald Ramp, MD [facility]  11/24/22

## 2022-11-24 NOTE — Telephone Encounter (Signed)
Gabriela [name] from [facility] called in, gave case # for death certificate. [id number]

## 2022-11-28 ENCOUNTER — Inpatient Hospital Stay: Payer: 59 | Admitting: Physical Medicine and Rehabilitation

## 2022-12-02 ENCOUNTER — Ambulatory Visit: Payer: 59

## 2022-12-02 ENCOUNTER — Ambulatory Visit: Payer: 59 | Admitting: Physician Assistant

## 2022-12-02 ENCOUNTER — Other Ambulatory Visit: Payer: 59

## 2022-12-14 NOTE — Telephone Encounter (Signed)
Copied from CRM [id number]. Topic: General - Other >> Nov 23, 2022 10:03 AM Patsy Lager T wrote: Reason for CRM: Sgt Herrin called to advise that patient passed away and would like to know if provider would sign the death certificate

## 2022-12-14 DEATH — deceased

## 2023-01-02 ENCOUNTER — Other Ambulatory Visit: Payer: 59

## 2023-01-02 ENCOUNTER — Ambulatory Visit: Payer: 59

## 2023-01-02 ENCOUNTER — Ambulatory Visit: Payer: 59 | Admitting: Oncology
